# Patient Record
Sex: Female | Born: 1965
Health system: Southern US, Community
[De-identification: ages and names within clinical notes are randomized; demographics above are authoritative.]

## PROBLEM LIST (undated history)

## (undated) ENCOUNTER — Ambulatory Visit: Payer: BC Managed Care – PPO

## (undated) DIAGNOSIS — F329 Major depressive disorder, single episode, unspecified: Secondary | ICD-10-CM

## (undated) DIAGNOSIS — F419 Anxiety disorder, unspecified: Secondary | ICD-10-CM

## (undated) DIAGNOSIS — I2510 Atherosclerotic heart disease of native coronary artery without angina pectoris: Secondary | ICD-10-CM

## (undated) DIAGNOSIS — I5189 Other ill-defined heart diseases: Secondary | ICD-10-CM

## (undated) DIAGNOSIS — F32A Depression, unspecified: Secondary | ICD-10-CM

## (undated) DIAGNOSIS — K222 Esophageal obstruction: Secondary | ICD-10-CM

## (undated) DIAGNOSIS — K449 Diaphragmatic hernia without obstruction or gangrene: Secondary | ICD-10-CM

## (undated) DIAGNOSIS — E785 Hyperlipidemia, unspecified: Secondary | ICD-10-CM

## (undated) DIAGNOSIS — M4300 Spondylolysis, site unspecified: Secondary | ICD-10-CM

## (undated) DIAGNOSIS — M0579 Rheumatoid arthritis with rheumatoid factor of multiple sites without organ or systems involvement: Secondary | ICD-10-CM

## (undated) DIAGNOSIS — M1990 Unspecified osteoarthritis, unspecified site: Secondary | ICD-10-CM

## (undated) DIAGNOSIS — M069 Rheumatoid arthritis, unspecified: Secondary | ICD-10-CM

## (undated) DIAGNOSIS — M797 Fibromyalgia: Secondary | ICD-10-CM

## (undated) DIAGNOSIS — M719 Bursopathy, unspecified: Secondary | ICD-10-CM

## (undated) DIAGNOSIS — B009 Herpesviral infection, unspecified: Secondary | ICD-10-CM

## (undated) HISTORY — DX: Morbid (severe) obesity due to excess calories: E66.01

## (undated) HISTORY — DX: Atherosclerotic heart disease of native coronary artery without angina pectoris: I25.10

## (undated) HISTORY — DX: Anxiety disorder, unspecified: F41.9

## (undated) HISTORY — DX: Hyperlipidemia, unspecified: E78.5

## (undated) HISTORY — DX: Herpesviral infection, unspecified: B00.9

## (undated) HISTORY — PX: HERNIA REPAIR: SHX51

## (undated) HISTORY — DX: Unspecified osteoarthritis, unspecified site: M19.90

## (undated) HISTORY — DX: Diaphragmatic hernia without obstruction or gangrene: K44.9

## (undated) HISTORY — PX: TUBAL LIGATION: SHX77

## (undated) HISTORY — DX: Rheumatoid arthritis with rheumatoid factor of multiple sites without organ or systems involvement: M05.79

## (undated) HISTORY — DX: Bursopathy, unspecified: M71.9

## (undated) HISTORY — DX: Spondylolysis, site unspecified: M43.00

## (undated) HISTORY — DX: Esophageal obstruction: K22.2

## (undated) HISTORY — DX: Other ill-defined heart diseases: I51.89

---

## 1998-04-23 ENCOUNTER — Other Ambulatory Visit: Admission: RE | Admit: 1998-04-23 | Discharge: 1998-04-23 | Payer: Self-pay | Admitting: Obstetrics and Gynecology

## 1998-06-07 DIAGNOSIS — G43909 Migraine, unspecified, not intractable, without status migrainosus: Secondary | ICD-10-CM | POA: Insufficient documentation

## 1998-06-16 ENCOUNTER — Inpatient Hospital Stay (HOSPITAL_COMMUNITY): Admission: AD | Admit: 1998-06-16 | Discharge: 1998-06-16 | Payer: Self-pay | Admitting: Obstetrics & Gynecology

## 1998-08-03 ENCOUNTER — Inpatient Hospital Stay (HOSPITAL_COMMUNITY): Admission: AD | Admit: 1998-08-03 | Discharge: 1998-08-03 | Payer: Self-pay | Admitting: Obstetrics and Gynecology

## 1998-11-08 ENCOUNTER — Inpatient Hospital Stay (HOSPITAL_COMMUNITY): Admission: AD | Admit: 1998-11-08 | Discharge: 1998-11-08 | Payer: Self-pay | Admitting: *Deleted

## 1998-11-26 ENCOUNTER — Inpatient Hospital Stay (HOSPITAL_COMMUNITY): Admission: AD | Admit: 1998-11-26 | Discharge: 1998-11-30 | Payer: Self-pay | Admitting: Obstetrics and Gynecology

## 1998-11-26 ENCOUNTER — Ambulatory Visit (HOSPITAL_COMMUNITY): Admission: RE | Admit: 1998-11-26 | Discharge: 1998-11-26 | Payer: Self-pay | Admitting: Obstetrics and Gynecology

## 1998-12-11 ENCOUNTER — Encounter (HOSPITAL_COMMUNITY): Admission: RE | Admit: 1998-12-11 | Discharge: 1999-03-11 | Payer: Self-pay | Admitting: Obstetrics and Gynecology

## 1998-12-16 ENCOUNTER — Encounter: Admission: RE | Admit: 1998-12-16 | Discharge: 1999-03-16 | Payer: Self-pay | Admitting: Obstetrics and Gynecology

## 1999-04-27 ENCOUNTER — Other Ambulatory Visit: Admission: RE | Admit: 1999-04-27 | Discharge: 1999-04-27 | Payer: Self-pay | Admitting: Obstetrics and Gynecology

## 1999-05-04 ENCOUNTER — Emergency Department (HOSPITAL_COMMUNITY): Admission: EM | Admit: 1999-05-04 | Discharge: 1999-05-04 | Payer: Self-pay | Admitting: Emergency Medicine

## 1999-05-05 ENCOUNTER — Encounter: Payer: Self-pay | Admitting: Family Medicine

## 1999-05-05 ENCOUNTER — Encounter: Admission: RE | Admit: 1999-05-05 | Discharge: 1999-05-05 | Payer: Self-pay | Admitting: Family Medicine

## 2000-01-09 ENCOUNTER — Inpatient Hospital Stay (HOSPITAL_COMMUNITY): Admission: AD | Admit: 2000-01-09 | Discharge: 2000-01-09 | Payer: Self-pay | Admitting: Obstetrics and Gynecology

## 2000-03-23 ENCOUNTER — Inpatient Hospital Stay (HOSPITAL_COMMUNITY): Admission: AD | Admit: 2000-03-23 | Discharge: 2000-03-23 | Payer: Self-pay | Admitting: Obstetrics & Gynecology

## 2000-03-25 ENCOUNTER — Ambulatory Visit (HOSPITAL_COMMUNITY): Admission: RE | Admit: 2000-03-25 | Discharge: 2000-03-25 | Payer: Self-pay | Admitting: Obstetrics and Gynecology

## 2000-03-25 ENCOUNTER — Encounter: Payer: Self-pay | Admitting: Obstetrics and Gynecology

## 2000-04-17 ENCOUNTER — Inpatient Hospital Stay (HOSPITAL_COMMUNITY): Admission: AD | Admit: 2000-04-17 | Discharge: 2000-04-17 | Payer: Self-pay | Admitting: Obstetrics and Gynecology

## 2000-04-29 ENCOUNTER — Encounter (INDEPENDENT_AMBULATORY_CARE_PROVIDER_SITE_OTHER): Payer: Self-pay

## 2000-04-29 ENCOUNTER — Inpatient Hospital Stay (HOSPITAL_COMMUNITY): Admission: AD | Admit: 2000-04-29 | Discharge: 2000-05-02 | Payer: Self-pay | Admitting: Obstetrics & Gynecology

## 2000-05-30 ENCOUNTER — Other Ambulatory Visit: Admission: RE | Admit: 2000-05-30 | Discharge: 2000-05-30 | Payer: Self-pay | Admitting: Obstetrics & Gynecology

## 2000-12-24 ENCOUNTER — Encounter: Payer: Self-pay | Admitting: Emergency Medicine

## 2000-12-24 ENCOUNTER — Inpatient Hospital Stay (HOSPITAL_COMMUNITY): Admission: EM | Admit: 2000-12-24 | Discharge: 2000-12-26 | Payer: Self-pay | Admitting: Emergency Medicine

## 2000-12-26 ENCOUNTER — Encounter: Payer: Self-pay | Admitting: Internal Medicine

## 2001-02-09 ENCOUNTER — Inpatient Hospital Stay (HOSPITAL_COMMUNITY): Admission: EM | Admit: 2001-02-09 | Discharge: 2001-02-12 | Payer: Self-pay | Admitting: *Deleted

## 2001-11-10 ENCOUNTER — Encounter: Payer: Self-pay | Admitting: Emergency Medicine

## 2001-11-10 ENCOUNTER — Emergency Department (HOSPITAL_COMMUNITY): Admission: EM | Admit: 2001-11-10 | Discharge: 2001-11-10 | Payer: Self-pay | Admitting: Emergency Medicine

## 2001-12-25 ENCOUNTER — Encounter: Payer: Self-pay | Admitting: Emergency Medicine

## 2001-12-25 ENCOUNTER — Emergency Department (HOSPITAL_COMMUNITY): Admission: EM | Admit: 2001-12-25 | Discharge: 2001-12-25 | Payer: Self-pay | Admitting: Emergency Medicine

## 2003-01-28 ENCOUNTER — Encounter: Payer: Self-pay | Admitting: Emergency Medicine

## 2003-01-28 ENCOUNTER — Emergency Department (HOSPITAL_COMMUNITY): Admission: EM | Admit: 2003-01-28 | Discharge: 2003-01-28 | Payer: Self-pay | Admitting: Emergency Medicine

## 2003-03-26 ENCOUNTER — Encounter: Payer: Self-pay | Admitting: Emergency Medicine

## 2003-03-26 ENCOUNTER — Emergency Department (HOSPITAL_COMMUNITY): Admission: EM | Admit: 2003-03-26 | Discharge: 2003-03-27 | Payer: Self-pay | Admitting: Emergency Medicine

## 2003-07-18 ENCOUNTER — Emergency Department (HOSPITAL_COMMUNITY): Admission: EM | Admit: 2003-07-18 | Discharge: 2003-07-18 | Payer: Self-pay | Admitting: Emergency Medicine

## 2003-12-03 ENCOUNTER — Other Ambulatory Visit: Admission: RE | Admit: 2003-12-03 | Discharge: 2003-12-03 | Payer: Self-pay | Admitting: Obstetrics & Gynecology

## 2004-10-18 ENCOUNTER — Emergency Department (HOSPITAL_COMMUNITY): Admission: EM | Admit: 2004-10-18 | Discharge: 2004-10-18 | Payer: Self-pay | Admitting: Family Medicine

## 2004-11-13 ENCOUNTER — Emergency Department (HOSPITAL_COMMUNITY): Admission: EM | Admit: 2004-11-13 | Discharge: 2004-11-13 | Payer: Self-pay | Admitting: *Deleted

## 2004-11-14 ENCOUNTER — Emergency Department (HOSPITAL_COMMUNITY): Admission: EM | Admit: 2004-11-14 | Discharge: 2004-11-14 | Payer: Self-pay | Admitting: *Deleted

## 2005-01-22 ENCOUNTER — Emergency Department (HOSPITAL_COMMUNITY): Admission: EM | Admit: 2005-01-22 | Discharge: 2005-01-22 | Payer: Self-pay | Admitting: Family Medicine

## 2005-07-27 ENCOUNTER — Encounter: Admission: RE | Admit: 2005-07-27 | Discharge: 2005-07-27 | Payer: Self-pay | Admitting: Family Medicine

## 2005-11-11 ENCOUNTER — Emergency Department (HOSPITAL_COMMUNITY): Admission: EM | Admit: 2005-11-11 | Discharge: 2005-11-12 | Payer: Self-pay | Admitting: Family Medicine

## 2005-11-30 ENCOUNTER — Emergency Department (HOSPITAL_COMMUNITY): Admission: EM | Admit: 2005-11-30 | Discharge: 2005-11-30 | Payer: Self-pay | Admitting: Emergency Medicine

## 2005-12-20 ENCOUNTER — Ambulatory Visit (HOSPITAL_BASED_OUTPATIENT_CLINIC_OR_DEPARTMENT_OTHER): Admission: RE | Admit: 2005-12-20 | Discharge: 2005-12-20 | Payer: Self-pay | Admitting: Otolaryngology

## 2005-12-26 ENCOUNTER — Ambulatory Visit: Payer: Self-pay | Admitting: Internal Medicine

## 2006-04-13 ENCOUNTER — Encounter: Admission: RE | Admit: 2006-04-13 | Discharge: 2006-04-13 | Payer: Self-pay | Admitting: Internal Medicine

## 2006-07-07 ENCOUNTER — Emergency Department (HOSPITAL_COMMUNITY): Admission: EM | Admit: 2006-07-07 | Discharge: 2006-07-07 | Payer: Self-pay | Admitting: Emergency Medicine

## 2006-09-24 ENCOUNTER — Emergency Department (HOSPITAL_COMMUNITY): Admission: EM | Admit: 2006-09-24 | Discharge: 2006-09-24 | Payer: Self-pay | Admitting: Emergency Medicine

## 2006-10-02 ENCOUNTER — Ambulatory Visit (HOSPITAL_COMMUNITY): Admission: EM | Admit: 2006-10-02 | Discharge: 2006-10-02 | Payer: Self-pay | Admitting: Emergency Medicine

## 2006-11-07 ENCOUNTER — Encounter: Admission: RE | Admit: 2006-11-07 | Discharge: 2006-11-07 | Payer: Self-pay | Admitting: Family Medicine

## 2007-04-01 ENCOUNTER — Emergency Department (HOSPITAL_COMMUNITY): Admission: EM | Admit: 2007-04-01 | Discharge: 2007-04-01 | Payer: Self-pay | Admitting: Family Medicine

## 2007-05-16 ENCOUNTER — Emergency Department (HOSPITAL_COMMUNITY): Admission: EM | Admit: 2007-05-16 | Discharge: 2007-05-16 | Payer: Self-pay | Admitting: Emergency Medicine

## 2007-10-17 ENCOUNTER — Ambulatory Visit: Payer: Self-pay | Admitting: Cardiology

## 2007-10-17 ENCOUNTER — Inpatient Hospital Stay (HOSPITAL_COMMUNITY): Admission: EM | Admit: 2007-10-17 | Discharge: 2007-10-18 | Payer: Self-pay | Admitting: Emergency Medicine

## 2007-10-27 ENCOUNTER — Encounter: Payer: Self-pay | Admitting: Cardiology

## 2007-10-27 ENCOUNTER — Ambulatory Visit: Payer: Self-pay

## 2007-11-01 ENCOUNTER — Ambulatory Visit: Payer: Self-pay

## 2007-11-01 ENCOUNTER — Ambulatory Visit: Payer: Self-pay | Admitting: Cardiology

## 2007-11-25 ENCOUNTER — Emergency Department (HOSPITAL_COMMUNITY): Admission: EM | Admit: 2007-11-25 | Discharge: 2007-11-25 | Payer: Self-pay | Admitting: Emergency Medicine

## 2008-07-02 ENCOUNTER — Emergency Department (HOSPITAL_BASED_OUTPATIENT_CLINIC_OR_DEPARTMENT_OTHER): Admission: EM | Admit: 2008-07-02 | Discharge: 2008-07-02 | Payer: Self-pay | Admitting: Emergency Medicine

## 2008-07-02 ENCOUNTER — Ambulatory Visit: Payer: Self-pay | Admitting: Radiology

## 2008-07-18 ENCOUNTER — Ambulatory Visit: Payer: Self-pay | Admitting: Diagnostic Radiology

## 2008-07-18 ENCOUNTER — Emergency Department (HOSPITAL_BASED_OUTPATIENT_CLINIC_OR_DEPARTMENT_OTHER): Admission: EM | Admit: 2008-07-18 | Discharge: 2008-07-18 | Payer: Self-pay | Admitting: Internal Medicine

## 2008-07-22 ENCOUNTER — Encounter: Admission: RE | Admit: 2008-07-22 | Discharge: 2008-07-22 | Payer: Self-pay | Admitting: Family Medicine

## 2008-08-06 ENCOUNTER — Ambulatory Visit: Payer: Self-pay | Admitting: Diagnostic Radiology

## 2008-08-06 ENCOUNTER — Emergency Department (HOSPITAL_BASED_OUTPATIENT_CLINIC_OR_DEPARTMENT_OTHER): Admission: EM | Admit: 2008-08-06 | Discharge: 2008-08-06 | Payer: Self-pay | Admitting: Emergency Medicine

## 2008-10-11 ENCOUNTER — Emergency Department (HOSPITAL_BASED_OUTPATIENT_CLINIC_OR_DEPARTMENT_OTHER): Admission: EM | Admit: 2008-10-11 | Discharge: 2008-10-11 | Payer: Self-pay | Admitting: Emergency Medicine

## 2008-10-25 ENCOUNTER — Ambulatory Visit (HOSPITAL_COMMUNITY): Admission: RE | Admit: 2008-10-25 | Discharge: 2008-10-25 | Payer: Self-pay | Admitting: Obstetrics & Gynecology

## 2008-10-25 ENCOUNTER — Encounter (INDEPENDENT_AMBULATORY_CARE_PROVIDER_SITE_OTHER): Payer: Self-pay | Admitting: Obstetrics & Gynecology

## 2008-10-29 ENCOUNTER — Inpatient Hospital Stay (HOSPITAL_COMMUNITY): Admission: AD | Admit: 2008-10-29 | Discharge: 2008-10-29 | Payer: Self-pay | Admitting: Obstetrics & Gynecology

## 2009-04-09 ENCOUNTER — Ambulatory Visit (HOSPITAL_COMMUNITY): Admission: RE | Admit: 2009-04-09 | Discharge: 2009-04-09 | Payer: Self-pay | Admitting: Rheumatology

## 2009-09-03 ENCOUNTER — Ambulatory Visit: Payer: Self-pay | Admitting: Interventional Radiology

## 2009-09-03 ENCOUNTER — Emergency Department (HOSPITAL_BASED_OUTPATIENT_CLINIC_OR_DEPARTMENT_OTHER): Admission: EM | Admit: 2009-09-03 | Discharge: 2009-09-03 | Payer: Self-pay | Admitting: Emergency Medicine

## 2009-09-18 ENCOUNTER — Emergency Department (HOSPITAL_BASED_OUTPATIENT_CLINIC_OR_DEPARTMENT_OTHER): Admission: EM | Admit: 2009-09-18 | Discharge: 2009-09-18 | Payer: Self-pay | Admitting: Emergency Medicine

## 2009-11-28 ENCOUNTER — Ambulatory Visit: Payer: Self-pay | Admitting: Diagnostic Radiology

## 2009-11-28 ENCOUNTER — Emergency Department (HOSPITAL_BASED_OUTPATIENT_CLINIC_OR_DEPARTMENT_OTHER): Admission: EM | Admit: 2009-11-28 | Discharge: 2009-11-28 | Payer: Self-pay | Admitting: Emergency Medicine

## 2010-01-06 ENCOUNTER — Encounter: Admission: RE | Admit: 2010-01-06 | Discharge: 2010-01-06 | Payer: Self-pay | Admitting: Orthopedic Surgery

## 2010-01-19 ENCOUNTER — Encounter: Admission: RE | Admit: 2010-01-19 | Discharge: 2010-01-19 | Payer: Self-pay | Admitting: Obstetrics & Gynecology

## 2010-01-19 ENCOUNTER — Encounter: Admission: RE | Admit: 2010-01-19 | Discharge: 2010-01-19 | Payer: Self-pay | Admitting: Diagnostic Neuroimaging

## 2010-02-07 ENCOUNTER — Emergency Department (HOSPITAL_BASED_OUTPATIENT_CLINIC_OR_DEPARTMENT_OTHER): Admission: EM | Admit: 2010-02-07 | Discharge: 2010-02-07 | Payer: Self-pay | Admitting: Emergency Medicine

## 2010-02-07 ENCOUNTER — Emergency Department (HOSPITAL_COMMUNITY): Admission: EM | Admit: 2010-02-07 | Discharge: 2010-02-07 | Payer: Self-pay | Admitting: Emergency Medicine

## 2010-08-04 ENCOUNTER — Emergency Department (HOSPITAL_BASED_OUTPATIENT_CLINIC_OR_DEPARTMENT_OTHER)
Admission: EM | Admit: 2010-08-04 | Discharge: 2010-08-04 | Disposition: A | Discharge status: Home / Self Care | Payer: Managed Care, Other (non HMO) | Attending: Emergency Medicine | Admitting: Emergency Medicine

## 2010-08-04 DIAGNOSIS — R197 Diarrhea, unspecified: Secondary | ICD-10-CM | POA: Insufficient documentation

## 2010-08-04 DIAGNOSIS — G8929 Other chronic pain: Secondary | ICD-10-CM | POA: Insufficient documentation

## 2010-08-04 DIAGNOSIS — R1110 Vomiting, unspecified: Secondary | ICD-10-CM | POA: Insufficient documentation

## 2010-08-04 DIAGNOSIS — K5289 Other specified noninfective gastroenteritis and colitis: Secondary | ICD-10-CM | POA: Insufficient documentation

## 2010-08-04 LAB — DIFFERENTIAL
Basophils Absolute: 0 10*3/uL (ref 0.0–0.1)
Basophils Relative: 0 % (ref 0–1)
Eosinophils Absolute: 0 10*3/uL (ref 0.0–0.7)
Neutrophils Relative %: 88 % — ABNORMAL HIGH (ref 43–77)

## 2010-08-04 LAB — CBC
Platelets: 239 10*3/uL (ref 150–400)
RBC: 5.1 MIL/uL (ref 3.87–5.11)
WBC: 10.1 10*3/uL (ref 4.0–10.5)

## 2010-08-04 LAB — URINALYSIS, ROUTINE W REFLEX MICROSCOPIC
Ketones, ur: NEGATIVE mg/dL
Nitrite: NEGATIVE
Specific Gravity, Urine: 1.027 (ref 1.005–1.030)
pH: 6 (ref 5.0–8.0)

## 2010-08-04 LAB — BASIC METABOLIC PANEL
BUN: 13 mg/dL (ref 6–23)
GFR calc non Af Amer: >60 mL/min (ref >60)
Potassium: 4 mEq/L (ref 3.5–5.1)

## 2010-08-31 LAB — CBC
MCV: 84.5 fL (ref 78.0–100.0)
RBC: 4.75 MIL/uL (ref 3.87–5.11)
WBC: 9.9 10*3/uL (ref 4.0–10.5)

## 2010-08-31 LAB — POCT CARDIAC MARKERS
CKMB, poc: <1 ng/mL — ABNORMAL LOW (ref 1.0–8.0)
Myoglobin, poc: 58.8 ng/mL (ref 12–200)
Troponin i, poc: <0.05 ng/mL (ref 0.00–0.09)

## 2010-08-31 LAB — DIFFERENTIAL
Lymphocytes Relative: 38 % (ref 12–46)
Lymphs Abs: 3.8 10*3/uL (ref 0.7–4.0)
Monocytes Relative: 4 % (ref 3–12)
Neutro Abs: 5.3 10*3/uL (ref 1.7–7.7)
Neutrophils Relative %: 54 % (ref 43–77)

## 2010-08-31 LAB — BASIC METABOLIC PANEL
Calcium: 9 mg/dL (ref 8.4–10.5)
Chloride: 109 mEq/L (ref 96–112)
Creatinine, Ser: 0.8 mg/dL (ref 0.4–1.2)
GFR calc Af Amer: >60 mL/min (ref >60)
GFR calc non Af Amer: >60 mL/min (ref >60)

## 2010-08-31 LAB — D-DIMER, QUANTITATIVE: D-Dimer, Quant: 0.33 ug/mL-FEU (ref 0.00–0.48)

## 2010-09-15 LAB — CBC
HCT: 40.4 % (ref 36.0–46.0)
MCHC: 33.5 g/dL (ref 30.0–36.0)
MCV: 82.4 fL (ref 78.0–100.0)
Platelets: 228 10*3/uL (ref 150–400)
RBC: 4.9 MIL/uL (ref 3.87–5.11)
RDW: 13.1 % (ref 11.5–15.5)
WBC: 9.2 10*3/uL (ref 4.0–10.5)

## 2010-09-15 LAB — BASIC METABOLIC PANEL
BUN: 11 mg/dL (ref 6–23)
CO2: 26 mEq/L (ref 19–32)
Chloride: 107 mEq/L (ref 96–112)
Creatinine, Ser: 0.8 mg/dL (ref 0.4–1.2)
GFR calc Af Amer: >60 mL/min (ref >60)
Potassium: 3.5 mEq/L (ref 3.5–5.1)

## 2010-09-15 LAB — URINALYSIS, ROUTINE W REFLEX MICROSCOPIC
Ketones, ur: NEGATIVE mg/dL
Protein, ur: 30 mg/dL — AB
Urobilinogen, UA: 1 mg/dL (ref 0.0–1.0)

## 2010-09-15 LAB — DIFFERENTIAL
Basophils Relative: 1 % (ref 0–1)
Eosinophils Absolute: 0.2 10*3/uL (ref 0.0–0.7)
Eosinophils Relative: 3 % (ref 0–5)
Lymphs Abs: 3.6 10*3/uL (ref 0.7–4.0)
Monocytes Absolute: 0.5 10*3/uL (ref 0.1–1.0)
Monocytes Relative: 6 % (ref 3–12)
Neutrophils Relative %: 51 % (ref 43–77)

## 2010-09-15 LAB — POCT CARDIAC MARKERS: Myoglobin, poc: 60.7 ng/mL (ref 12–200)

## 2010-09-15 LAB — URINE MICROSCOPIC-ADD ON

## 2010-09-15 LAB — PREGNANCY, URINE: Preg Test, Ur: NEGATIVE

## 2010-10-20 NOTE — Op Note (Signed)
NAME:  [name] [name], [name] [name]          ACCOUNT NO.:  [id number]   MEDICAL RECORD NO.:  [id number]          PATIENT TYPE:  AMB   LOCATION:  SDC                           FACILITY:  [facility]   PHYSICIAN:  Ilda Mori, M.D.   DATE OF BIRTH:  09/12/1965   DATE OF PROCEDURE:  10/25/2008  DATE OF DISCHARGE:  10/25/2008                               OPERATIVE REPORT   PREOPERATIVE DIAGNOSIS:  Menorrhagia.   POSTOPERATIVE DIAGNOSIS:  Menorrhagia.   PROCEDURES:  1. Hysteroscopy.  2. NovaSure endometrial ablation.   SURGEON:  Ilda Mori, M.D.   ANESTHESIA:  General and the paracervical block was placed as well.   FINDINGS:  Small endometrial polyp, otherwise normal appearing uterine  cavity.   ESTIMATED BLOOD LOSS:  Minimal.   SPECIMENS:  Polyp and endometrial curettings.   INDICATIONS:  This is a 45 year old gravida 2, para 2 female status post  cesarean section x2, who has noted persistent heavy menstrual cycles  over the last 3 years.  Attempts to control her bleeding with birth  control pills had been unsuccessful.  The patient claims she bleeds  heavily and uses 40 pads per cycle and at times floods and soils her  clothing and her bed sheets.  Options were discussed with the patient  who elected to proceed with a NovaSure endometrial ablation.   DESCRIPTION OF PROCEDURE:  The patient was taken to the operating room  and general anesthesia was induced.  She was then placed in dorsal  lithotomy position, and vagina, perineum and vulva were prepped and  draped in a sterile fashion.  A time-out was then undertaken.  The  bladder was catheterized and emptied.  The cervix was grasped with  single-tooth tenaculum.  A Hagar dilator was placed and sounded the  cervix to approximately 3.7 mm.  A sound was then placed and sounded the  total uterus and cervix to 10 cm.  The internal os was dilated to 21-  [place].  A hysteroscope was introduced and a small endometrial polyp was  noted  otherwise the cavity appeared normal.  A polyp forceps was used  and the polyp was successfully removed.  The internal os was then  dilated further to a 25-French.  The NovaSure was introduced to the  fundus, withdrawn 1 cm and then deployed.  It was seated with a twisting  up and down motions, pushing and pulling until the width of the  instrument was 3.1 cm and appeared to  go no further.  The carbon dioxide test passed and ablation was carried  out for a 74-second.  After removal of the NovaSure, the cavity was  reexamined and a successful ablation appeared to have been carried out.  The procedure was then terminated and the patient left the operating  room in good condition.      Ilda Mori, M.D.  Electronically Signed     RK/MEDQ  D:  10/25/2008  T:  10/26/2008  Job:  [id number]

## 2010-10-20 NOTE — H&P (Signed)
NAMEMarland Kitchen  [name], [name]          ACCOUNT NO.:  [id number]   MEDICAL RECORD NO.:  [id number]          PATIENT TYPE:  INP   LOCATION:  1436                         FACILITY:  [facility]   PHYSICIAN:  Beckey Rutter, MD  DATE OF BIRTH:  1965-12-13   DATE OF ADMISSION:  10/16/2007  DATE OF DISCHARGE:                              HISTORY & PHYSICAL   PRIMARY CARE PHYSICIAN:  Dr. Andi Devon.   CHIEF COMPLAINT:  Chest pain.   HISTORY OF PRESENT ILLNESS:  This is a 45 year old pleasant African  American female with past medical history significant for obesity.  Presented today with chest pain.  Chest pain started while she was  trying to get inside her car.  The pain is mainly in the center of her  anterior chest with radiation in the form of numbness.  The pain is also  radiated to the left arm.  No significant shortness of breath is  associated with her condition.  No history of coronary artery disease,  hypertension, diabetes or hyperlipidemia mentioned.  The patient relates  sedentary life overall and had limited exercise capacity, but the  patient overall is able to walk 15 minutes every day.  The chest pain as  mentioned is pressure-like the pain has no aggravating factor and no  known relieving factor.  The patient started to feel headache after she  was given nitroglycerin, though the nitroglycerin did not affect the  intensity of the pain.  The patient has minimal pain currently, less  than 5.   PAST MEDICAL HISTORY:  Significant for obesity, otherwise, denied any  past history.   SOCIAL HISTORY:  No known drug abuse, nondrinker, nonsmoker.   SURGICAL HISTORY:  The patient had kidney stone status post surgical  excision.   FAMILY HISTORY:  Noncontributory.   REVIEW OF SYSTEMS:  A 12 point review of systems is non revealing/non-  pertinent   PHYSICAL EXAMINATION:  VITAL SIGNS:  Blood pressure is 105/62, pulse  rate 78.  Respiratory rate is 20, temperature 97.5.  HEENT:  Head:  Atraumatic, normocephalic.  Eyes: PERRL.  Mouth moist.  No ulcer.  NECK:  Supple.  No JVD.  LUNGS:  Bilateral decreased air entry.  PRECORDIUM:  Distal heart sounds.  ABDOMEN:  Very obese.  Bowel sounds are positive.  No tenderness.  EXTREMITIES:  No lower extremity edema.   LABORATORY DATA:  EKG was showing normal sinus rhythm with ventricular  rate 79.  No specific T wave abnormality and some prolongation in QTC  which is 460.  Sodium is 144, potassium 4.0, chloride is 108, bicarb 31,  glucose 86, BUN 9, creatinine 0.90, white blood count is 10.7,  hemoglobin 14.2, hematocrit 43.0, platelet count is 264.   ASSESSMENT AND PLAN:  This 45 year old with atypical chest pain will  admit for observation to rule out acute coronary syndrome.  Will cycle  cardiac enzymes.  Will check her EKG every 8 hours.  The patient will  also have TSH and lipid profile checked during the hospital course.  For  DVT prophylaxis will start Lovenox.  For GI prophylaxis will start  Protonix.  Beckey Rutter, MD  Electronically Signed     EME/MEDQ  D:  10/17/2007  T:  10/17/2007  Job:  [id number]   cc:   Merlene Laughter. Renae Gloss, M.D.  Fax: [phone]

## 2010-10-20 NOTE — Assessment & Plan Note (Signed)
[facility]                            CARDIOLOGY OFFICE NOTE   [name] [name], [name] [name]                 MRN:          [id number]  DATE:11/01/2007                            DOB:          [birth year]    PRIMARY CARE PHYSICIAN:  Merlene Laughter. Renae Gloss, M.D.   CARDIOLOGIST:  Jesse Sans. Wall, MD, FACC   This is a 45 year old African-American female patient who was admitted  to the hospital with atypical chest pain.  She has a history of normal  cath by Dr. Elsie Lincoln in 2002.  She has a history of obesity and anxiety.  She ruled out for an MI and was discharged home on metoprolol for  elevated blood pressure on arrival and a PPI.  She had an adenosine  Myoview in our office today that was read by Dr. Myrtis Ser.  This was read as  breast attenuation, and the images are noisy.  There is slight decreased  activity at the apex; however, I believe that there is no significant  abnormality.  Ejection fraction was 68%.   The patient states she still has pinpoint chest pain if she stands or  moves that can be sharp shooting.  It comes and goes and is not related  to exertion.  She did have elevated CKs but negative MBs and troponins  in the hospital.  Her D-dimer was normal at 0.34, but her TSH was  slightly elevated at 5.6.  HDL was low at 35 and LDL was high at 116.  Total cholesterol 169.   CURRENT MEDICATIONS:  1. Metoprolol 12.5 mg b.i.d.  2. Aspirin 325 daily.  3. Protonix 40 mg daily.   PHYSICAL EXAMINATION:  GENERAL:  This is a pleasant 45 year old African-  American female in no acute distress.  VITAL SIGNS:  Blood pressure 120/80, pulse 66.  NECK:  Without JVD, hepatojugular reflux, bruit or thyroid enlargement.  LUNGS:  Clear anterior, posterior and lateral.  HEART:  Regular rate and rhythm at 66 beats per minute.  Normal S1 and  S2.  No murmur, rub, bruit, thrill or heave noted.  ABDOMEN:  Soft without organomegaly, masses, lesions or abnormal  tenderness.  EXTREMITIES:  Without cyanosis or edema.  She has good distal pulses.   IMPRESSION:  1. Atypical chest pain, question etiology.  2. Negative adenosine Myoview on Nov 01, 2007.  Ejection fraction 68%.  3. History of normal cardiac cath in 2002 by Dr. Orvan Falconer.  4. Question of hypertension.  5. Anxiety depression.  6. Morbid obesity.  7. Borderline hypothyroidism.  8. History of kidney stones, treated with lithotripsy.   PLAN AT THIS TIME:  We do not feel her chest pain is cardiac in origin.  She is to follow up with Dr. Renae Gloss tomorrow, and I asked her to  discuss any further workup and treatment of her thyroid, as well as the  possibility of stopping her beta blocker if she begins exercising and a  weight loss program.  She can follow up with [place] p.r.n.      [name] Reedy, PA-C  Electronically Signed      Jonelle Sidle, MD  Electronically Signed   ML/MedQ  DD: 11/01/2007  DT: 11/01/2007  Job #: [id number]   cc:   Merlene Laughter. Renae Gloss, M.D.

## 2010-10-20 NOTE — Discharge Summary (Signed)
NAMEMarland [name]  [name], DOLS          ACCOUNT NO.:  [id number]   MEDICAL RECORD NO.:  [id number]          PATIENT TYPE:  INP   LOCATION:  1436                         FACILITY:  [facility]   PHYSICIAN:  Madaline Savage, MD        DATE OF BIRTH:  06-17-1965   DATE OF ADMISSION:  10/16/2007  DATE OF DISCHARGE:  10/18/2007                               DISCHARGE SUMMARY   PRIMARY CARE PHYSICIAN:  Cala Bradford R. Renae Gloss, M.D.   CONSULTS IN THE HOSPITAL:  She was seen by Dr. Daleen Squibb from [facility].   DISCHARGE DIAGNOSES:  1. Atypical chest pain.  2. Morbid obesity.  3. Depression.  4. Anxiety.   DISCHARGE MEDICATIONS:  1. Aspirin 325 mg daily.  2. Lopressor 12.5 mg twice daily.  3. Protonix 40 mg daily.   HISTORY OF PRESENT ILLNESS:  For full history and physical, see the  history and physical dictated by Dr. Tamsen Roers on Oct 17, 2007.  In short,  Ms. [name] [name] is a 45 year old lady with a history of morbid obesity who  comes in with atypical chest pain which was not relieved by  nitroglycerin.  She is admitted, and cardiology was consulted.   PROBLEM LIST:  1. Atypical chest pain.  Ms. Dobesh was admitted, and cardiology was      consulted.  Her initial EKG was normal sinus rhythm.  Her cardiac      enzymes were negative.  Her subsequent EKG showed some nonspecific      T-wave abnormalities.  At this time, cardiology has recommended an      outpatient stress test and also to start her on a proton pump      inhibitor.  Symptoms have improved, but she still continues to have      off and on chest pain.  They have arranged for an outpatient stress      test on Nov 01, 2007, and she will also get an outpatient      echocardiogram.  She also tells me that she has been depressed of      late, but she is not suicidal or homicidal at this time.  She has      good affect.  She has good eye contact.  At this time, I would      recommend her to follow up with her primary care doctor as soon as    possible.   DISPOSITION:  She is now being discharged home in stable condition.   FOLLOW UP:  She is asked to follow up with her primary care doctor, Dr.  Renae Gloss, in 1 week, and she also has an appointment for an outpatient  stress test on Nov 01, 2007 at 9:00 a.m., and an outpatient  echocardiogram on Oct 27, 2007 at 8:15 a.m.  She is being given the  phone number of [phone] to call to confirm her appointments.      Madaline Savage, MD  Electronically Signed     PKN/MEDQ  D:  10/18/2007  T:  10/18/2007  Job:  [id number]   cc:   Merlene Laughter. Renae Gloss, M.D.  Fax:  [phone]   Jesse Sans Wall, MD, FACC  [address]  [address]  [place]  [place] [address]

## 2010-10-20 NOTE — Consult Note (Signed)
NAME:  [name] [name], [name] [name]          ACCOUNT NO.:  [id number]   MEDICAL RECORD NO.:  [id number]          PATIENT TYPE:  INP   LOCATION:  1436                         FACILITY:  [facility]   PHYSICIAN:  Jesse Sans. Wall, MD, FACCDATE OF BIRTH:  1965/08/04   DATE OF CONSULTATION:  10/17/2007  DATE OF DISCHARGE:                                 CONSULTATION   PRIMARY CARDIOLOGIST:  [name] C. Wall, MD, [facility] (formerly Dr. Elsie Lincoln).   PRIMARY CARE PHYSICIAN:  Dr. Cipriano Bunker.   REQUESTING PHYSICIAN:  [facility].   REASON FOR CONSULTATION:  Chest pain.   HISTORY OF PRESENT ILLNESS:  This is a 45 year old African American  female, morbidly obese with no history of coronary artery disease, but  formally seen by Dr. Elsie Lincoln with a cath in 2002 with normal coronary  arteries per evaluation with review of records.  The patient has a  former diagnosis of anxiety, obesity, and kidney stones.  The patient  was climbing into her Zenaida Niece around 5:30 p.m. on day of admission and began  to have some substernal chest pain and tingling in her jaw, which began  around 8:30 p.m.  The substernal discomfort became worse, started  throughout the evening with associated tingling in her jaw.  She called  a nurse who was a neighbor who came by and took her blood pressure and  was found to be in the 160 systolic.  The nurse brought her to the  emergency room at [facility].  She was given baby aspirin,  nitroglycerin, and morphine and states that she really did not feel a  whole lot of relief.  The pain has not gone during the time of this  evaluation, but has actually lessened and gone down to about a 4/10.  The patient is a little rocky as she has just received some morphine.   REVIEW OF SYSTEMS:  Positive for sweating, chest pain, shortness of  breath, edema in her ankles, history of anxiety, and nausea.   PAST MEDICAL HISTORY:  Obesity, anxiety, kidney stone with lithotripsy,  and sciatica.  She had a cardiac  catheterization in 2002 per Dr. Elsie Lincoln,  report reviewed revealing normal coronaries with an EF of 58%.   PAST SURGICAL HISTORY:  Two C-sections, blood clot on the left foot,  postop ligament surgery, and urethral stent.   CURRENT MEDICATIONS AT HOME:  Xanax p.r.n. for anxiety.   MEDICATIONS HERE:  1. Aspirin 325 daily.  2. Lopressor 12.5 mg b.i.d.   She lives in [place] with her husband.  She works with [employer].  She is married with 2 children.  She does not smoke, does not drink  alcohol, and does not use drugs.  She has a lot of family issues  concerning to parents and a brother who are suicidal that she has had to  have committed.   FAMILY HISTORY:  Mother with depression and bipolar disease.  Father  with sarcoidosis.  Grandmother with CAD.  Sister with depression and a  brother with depression.   LABS:  Hemoglobin 14.2, hematocrit 43.0, white blood cell 10.7, and  platelets 264,000.  Sodium 143, potassium 4.1, chloride 111, CO2 25, BUN  9, creatinine 0.85, and glucose 83.  CK 453 and 395, MB 1.6 and 1.6, and  troponin 0.01 and 0.02 respectively.  Triglycerides 90, HDL 35, and LDL  116.   PHYSICAL EXAM:  VITAL SIGNS:  Blood pressure 120/72, pulse 78,  respirations 24, temperature 98.2, and O2 sat 96% on room air.  The  patient weighs 289 pounds.  HEENT:  Head is normocephalic and atraumatic.  Eyes, PERRLA.  Mucous  membranes of the mouth are pink and moist.  Tongue is midline.  NECK:  Supple.  There is no JVD.  No thyromegaly noted.  No carotid  bruits appreciated.  CARDIOVASCULAR:  Regular rate and rhythm without murmurs, rubs, or  gallops.  LUNGS:  Clear to auscultation.  ABDOMEN:  Soft and nontender with 2+ bowel sounds.  She is obese.  EXTREMITIES:  Without clubbing, cyanosis, or edema present.  NEURO:  Cranial nerves II through XII are grossly intact.   Chest x-ray revealing shallow lung volumes with mild bibasilar  atelectasis.  EKG:  Normal sinus rhythm  with ventricular rate of 80  beats per minute.   IMPRESSION:  1. Atypical chest pain with normal acute EKG, normal enzymes, and      normal catheterization, 6 years ago.  2. Obesity.  3. Hypertension currently controlled on Lopressor.  4. Rule out gastrointestinal origin of symptoms.  5. Situational anxiety.   PLAN:  This is a 45 year old morbidly obese African-American female with  complaints of substernal chest pain, which are atypical in nature, not  associated with exertion with normal cardiac enzymes and negative EKG  with a cardiac catheterization in 2002.   We will recommend outpatient adenosine stress test and echocardiogram  with history of hypertension, we would continue Lopressor as you were  doing for blood pressure control.  We would recommend weight loss and we  will  start a PPI secondary to the patient's symptoms, but also recommend  statin as her LDL is greater than 100 or dietary lifestyle changes.  We  will make more recommendations after stress test and follow up.  This  could be done as an outpatient.       Bettey Mare. Lyman Bishop, NP      Jesse Sans. Daleen Squibb, MD, [facility]  Electronically Signed    KML/MEDQ  D:  10/17/2007  T:  10/18/2007  Job:  [id number]   cc:   Merlene Laughter. Renae Gloss, M.D.  Fax: [phone]

## 2010-10-23 NOTE — Op Note (Signed)
[facility]  Patient:    [name] [name], [name] [name]                 MRN: [id number] Proc. Date: 04/29/00 Adm. Date:  [id number] Attending:  Lars Pinks                           Operative Report  PREOPERATIVE DIAGNOSIS:       Previous low transverse cesarean section, voluntary sterilization, refuses trial of labor after cesarean.  POSTOPERATIVE DIAGNOSES:      Previous low transverse cesarean section, voluntary sterilization, refuses trial of labor after cesarean.  PROCEDURE:                    Repeat low transverse cesarean section and bilateral tubal ligation.  SURGEON:                      Richard D. Arlyce Dice, M.D.  ASSISTANT:                    Luvenia Redden, M.D.  ANESTHESIA:                   Spinal.  ESTIMATED BLOOD LOSS:         500 cc.  FINDINGS:                     An 8 pound 0 ounce female infant.  Apgar scores 9 and 9.  Clear amniotic fluid.  Normal appearing tubes and ovaries.  The lower segment was remarkable because of a marked thinning to the point of translucency over the underlying membranes.  The defect was approximately 7 cm of the entire length of the lower segment and the thickness of the uterine wall at that point was approximately 1 mm.  INDICATIONS:                  This is a 45 year old gravida 2, para 1 with estimated date of confinement of November 30 who requests repeat cesarean section.  The patients previous cesarean section was done because of cephalopelvic disproportion after a failed induction with a delivery of a 9 pound 10 ounce baby.  Estimated fetal weight in this pregnancy in approximately 9 pounds.  The patient also elects permanent sterilization. Understands the failure rate of two per five per 1000 and that the procedure is permanent.  PROCEDURE:                    The patient was taken to the operating room and a spinal block was placed.  The patient was then placed in the dorsal lithotomy  position with right lateral displacement of the uterus.  The abdomen was prepped and draped in a sterile fashion.  The bladder was catheterized. Incision was made through the previous laparotomy scar, carried down to the fascia which was then opened transversely.  The rectus muscle was then sharply and bluntly dissected away from the overlying rectus sheath and the rectus muscle was then divided in the midline.  The peritoneum was entered sharply and extended vertically.  The lower segment was then identified and seemed to be bulging and was totally translucent.  The thickness of the lower segment was less than 1 mm.  This was incised with the knife and the plane between the lower segment and the amniotic membrane was then carefully dissected and the lower  segment was then sharply divided transversely.  The membranes were then entered.  The infant was delivered without difficulty with the aid of vacuum extractor.  The cord bloods were obtained.  The placenta was delivered with traction.  The lower segment was bluntly curettaged and then closed with an interlocking 1-0 Vicryl suture.  Hemostasis was obtained in one area with a horizontal matris suture.  The attention was then turned to the fallopian tubes.  The left tube was grasped at the isthmic ampullary junction and doubly ligated with plain catgut suture.  The knuckle of tube was excised for pathological confirmation.  An identical procedure was then carried out on the contralateral tube.  The ovaries were inspected and were both normal.  The patient has had a history of right lower quadrant pain but there seems to be no adnexal pathology seen.  The peritoneum was then closed with a running 0 Vicryl suture.  The rectus muscle was reopposed in the midline with an 0 Vicryl suture.  The fascia was closed with a running 0 Vicryl suture.  The skin was closed with staples.  The patient tolerated procedure well and left the operating room in  good condition.DD:  04/29/00 TD:  04/29/00 Job: [id number]

## 2010-10-23 NOTE — Discharge Summary (Signed)
[place]  Patient:    [name] [name], [name] [name]                 MRN: [id number] Adm. Date:  [id number] Disc. Date: [id number] Attending:  Alva Garnet.                           Discharge Summary  DISCHARGE DIAGNOSES: 1. Atypical chest pain. 2. Generalized anxiety. 3. Obesity.  CONSULTATIONS:  [facility].  HOSPITAL COURSE:  #1 - ATYPICAL CHEST PAIN:  [name] presented with a week long history of episodic chest pain radiating to her left arm.  She has no risk factors other than obesity, however, she presented with an abnormal EKG.  She was placed on Lovenox while her cardiac enzymes were pending.  Her cardiac enzymes including her troponin and CK-MB levels were unremarkable. [facility] was consulted who performed a Cardiolite stress test. Her Cardiolite did show an apical abnormality, but no ischemic disease.  A 2D echocardiogram will be performed as an outpatient.  It was decided that her chest pain was most likely not due to a cardiac etiology.  Other etiologies to consider would include GI such as peptic ulcer disease or musculoskeletal problem.  #2 - GENERALIZED ANXIETY:  [name] does report a high level of personal and job stressors at this time contributing to increased anxiety symptoms. This may of course may be contributing as well to her chest pain.  She will be placed on Xanax 0.5 mg one p.o. b.i.d.  She will be medically cleared to return to work one week from today, January 02, 2001.  FOLLOWUP:  [name] will be seen by Dr. Renae Gloss within two to three weeks following discharge.  A 2D echocardiogram was also pending following discharge.  DISCHARGE MEDICATIONS: 1. Aspirin 325 mg q.d. 2. Pepcid 20 mg p.o. b.i.d. 3. Xanax 0.5 mg one p.o. b.i.d. DD:  12/26/00 TD:  12/28/00 Job: [id number]

## 2010-10-23 NOTE — Discharge Summary (Signed)
[place]. [facility]  Patient:    [name] [name], [name] [name] Visit Number: [id number] MRN: [id number]          Service Type: MED Location: [phone] Attending Physician:  Madaline Savage Md Dictated by:   Darcella Gasman. Ingold, F.N.P.C. Admit Date:  02/09/2001 Discharge Date: 02/12/2001   CC:         Merlene Laughter. Renae Gloss, M.D.   Discharge Summary  DISCHARGE DIAGNOSES: 1. Chest pain, noncardiac. 2. Anxiety disorder.  DISCHARGE CONDITION:  Improved.  PROCEDURES:  February 11, 2001 - Combined left heart catheterization by Dr. Madaline Savage.  Normal catheterization.  DISCHARGE MEDICATIONS: 1. Protonix 40 mg 1 daily instead of Pepcid. 2. Aspirin 81 mg daily. 3. Xanax 0.5 mg as needed for anxiety. 4. Darvocet was also added as p.r.n. for right groin pain.  DISCHARGE INSTRUCTIONS: 1. No strenuous activity, no sexual activity, and no lifting over five pounds    for three days, then resume regular activity. 2. Low fat diet. 3. Wash catheterization site with soap and water.  Call if any bleeding,    swelling, or drainage. 4. If any problems at catheterization site, call Dr. Reino Kent office. 5. Follow up with Dr. Renae Gloss the next week for further evaluation of chest    pain.  HISTORY OF PRESENT ILLNESS:  This 45 year old African-American female was admitted on February 10, 2001, with chest pain.  She had previously been admitted with chest pain and had a nuclear study with questionable scar on the nuclear study.  She was discharged from [facility] and had continued chest pain.  Due to continued chest pain problems, it was felt prudent to do cardiac catheterization to further ensure there was no coronary artery disease.  Unfortunately, her H&P is currently not with this chart.  It was at the time of admission, but currently not at present.  The patient had been scheduled for outpatient cardiac catheterization, but she came to the emergency room  on February 10, 2001, during the night with chest pain.  She was placed on IV heparin and nitroglycerin and plans were made for cardiac catheterization.  She was admitted to a telemetry bed and could not be done on February 10, 2001, due to scheduling conflicts at [facility]. Her cardiac catheterization was then scheduled for Saturday morning, February 11, 2001.  PAST MEDICAL HISTORY:  History of reflux disease, wears glasses.  FAMILY/SOCIAL/REVIEW OF SYSTEMS:  See H&P.  PHYSICAL EXAMINATION AT DISCHARGE:  VITAL SIGNS:  Blood pressure 120/72, pulse 84, respirations 20, temperature 98.1, room air oxygen saturation 97%.  GENERAL:  Sluggish, slow to react, African-American female.  NEUROLOGICAL:  Exam is intact.  HEART:  Heart sounds regular rate and rhythm.  LUNGS:  Clear.  RIGHT GROIN:  With adipose tissue, trivial bruise, no tenderness, no hematoma. Right pedal pulse is normal and equal to the left.  LABORATORY VALUES:  Hemoglobin 12.6, hematocrit 37.3.  WBC 8.1, MCV 81.3, platelets 240.  Neutrophils 50, lymphocytes 41, monocytes 7, eosinophils 2, basophils 1.  Pro time 13.3, INR 1, and PTT of 30.  Sodium 142, potassium 3.6, chloride 107, CO2 27, glucose 91, BUN 7, creatinine 0.8, calcium 8.9.  Cardiac enzymes:  CK 274, MB 1.2, troponin-I 0.01 and the second CK 254, MB 1.2, and troponin less than 0.01.  Urine pregnancy was negative.  Chest x-ray is currently not in this chart.  EKG:  Sinus rhythm, with sinus arrhythmia, right ventricular hypertrophy, and incomplete right bundle  branch block.  Nonspecific EKG changes.  Cardiac catheterization, February 11, 2001:  Patent coronary arteries, normal LV function with an EF of 58%.  LAD and left circumflex arise from separate ostia.  HOSPITAL COURSE:  [name] [name] was admitted from the emergency room with chest pain.  Cardiac enzymes were negative.  She was scheduled for cardiac catheterization on the day of  admission, unfortunately, scheduling would not permit and she was scheduled for the morning of February 11, 2001.  She underwent the procedure and tolerated it well, with normal coronaries.  Post catheterization, she developed right groin pain.  The patient refused to get up, as restless, and continued to complain of groin pain when she was asked to ambulate.  She was to go home on February 11, 2001, but patient stated her groin was painful and she did not want to be discharged.  She was given Tylenol.  By February 12, 2001, she continued with complaints.  Dr. Elsie Lincoln felt that she was sluggish and slow to react and it would take her several seconds to turn over in bed, much longer than it should.  He examined her and she was neurologically intact and oriented.  He felt that her behaviors were inappropriate.  She was discharged home.  Her groin was stable.  There was minimal bruising and no hematoma or tenderness.  FOLLOWUP:  She will follow up with Dr. Andi Devon for further chest pain workup. Dictated by:   Darcella Gasman Ingold, F.N.P.C. Attending Physician:  Madaline Savage Md DD:  02/27/01 TD:  02/27/01 Job: [id number]

## 2010-10-23 NOTE — Procedures (Signed)
NAME:  [name] [name], [name] [name]          ACCOUNT NO.:  [id number]   MEDICAL RECORD NO.:  [id number]          PATIENT TYPE:  OUT   LOCATION:  SLEEP CENTER                 FACILITY:  [facility]   PHYSICIAN:  Clinton D. Maple Hudson, M.D. DATE OF BIRTH:  December 14, 1965   DATE OF STUDY:  12/20/2005                              NOCTURNAL POLYSOMNOGRAM   REFERRING PHYSICIAN:  Dr. Hermelinda Medicus.   DATE OF STUDY:  December 20, 2005.   INDICATIONS FOR STUDY:  Hypersomnia with sleep apnea.   EPWORTH SLEEPINESS SCORE:  13/24, BMI 48.  Weight 165 pounds.   MEDICATIONS:  Home medication include Topamax, cephalosporin antibiotic.   SLEEP ARCHITECTURE:  Total sleep time 352 minutes with sleep efficiency 88%.  Stage I was 7%, stage II 76%, stages III and IV were absent, REM 16% of  total sleep time.  Sleep latency 13 minutes, REM latency 77 minutes, awake  after sleep onset 34 minutes, arousal index 16.   RESPIRATORY DATA:  NPSG diagnostic protocol ordered.  Apnea/hypopnea index  (AHI/RDI) 0.9 obstructive events per hour which is within normal limits  (normal range 0 to 5 per hour).  This reflected 1 obstructive apnea and 4  hypopneas.  Most events occurred while sleeping supine.  REM AHI of 3.1 per  hour.   OXYGEN DATA:  Intermittent mild to moderate snoring with oxygen desaturation  to a nadir of 88%.  Mean oxygen saturation through the study was 95% on room  air.   CARDIAC DATA:  Normal sinus rhythm.   MOVEMENT/PARASOMNIA:  A total of 100 limb jerks were recorded of which 12  were associated with arousal or awakening for periodic limb movement with  arousal index of 2 per hour, which is probably not significant.   IMPRESSION/RECOMMENDATIONS:  1.  Occasional sleep disordered breathing events, apnea/hypopnea index of      0.9 per hour (normal range 0 to 5 per hour).  Most events were while      supine.  Snoring was mild with oxygen desaturation to a nadir of 88%.  2.  Periodic limb movement with arousal, 2 per  hour.  3.  No significant sleep disorder was identified from this study.  As      general guidelines, weight loss and efforts to sleep off of flat of back      may reduce any occasional sleep disordered breathing events at home.      Clinton D. Maple Hudson, M.D.  Diplomate, Biomedical engineer of Sleep Medicine  Electronically Signed     CDY/MEDQ  D:  12/26/2005 08:50:09  T:  12/26/2005 21:42:29  Job:  [id number]

## 2010-10-23 NOTE — Op Note (Signed)
NAME:  [name] [name], [name] [name]          ACCOUNT NO.:  [id number]   MEDICAL RECORD NO.:  [id number]          PATIENT TYPE:  EMS   LOCATION:  ED                           FACILITY:  [facility]   PHYSICIAN:  Valetta Fuller, M.D.  DATE OF BIRTH:  1965-07-27   DATE OF PROCEDURE:  10/02/2006  DATE OF DISCHARGE:                               OPERATIVE REPORT   PREOPERATIVE DIAGNOSIS:  5 mm proximal right ureteral calculus.   POSTOPERATIVE DIAGNOSIS:  5 mm proximal right ureteral calculus.   PROCEDURE PERFORMED:  Cystoscopy, right retrograde pyelography, right  rigid ureteroscopy, right flexible ureteroscopy, holmium laser  lithotripsy of stone, basketing of stone fragments, and right double-J  stent placement.   SURGEON:  Dr. Isabel Caprice.   ANESTHESIA:  Was general.   INDICATIONS:  [name] [name] is a 45 year old female who presented to the  local emergency room recently with right flank pain.  She was diagnosed  with a right proximal ureteral calculus.  The patient has continued to  have significant amounts of discomfort.  She had been back to the office  on at least one if not two occasions to get additional pain medication  in the form of Toradol.  She contacted Dr. Sherron Monday on Friday who told  me that this patient was having some difficulties and may need  intervention this weekend.  She called [place] earlier this morning reporting  that she could not take the pain anymore and wanted to have something  done if at all possible.  We directed her to the emergency room were  pain was managed.  A KUB showed continued presence of the suspicious  calcification in the area of the right proximal ureter.  The overall  location appeared to be relatively unchanged from what had been  appreciated on CT.  We went through several options with the patient.  This included continued observation along with the possibility of  lithotripsy sometime this week.  Other options include double-J stent  placement followed  by lithotripsy in attempt to try to treat the stone  definitively.  She requested definitive treatment if at all possible.  She appeared to understand the advantages and disadvantages of that  approach and full informed consent was obtained.   TECHNIQUE AND FINDINGS:  The patient was brought to the operating room  where she had successful induction of general anesthesia.  She was  placed in lithotomy position and prepped, draped usual manner.  Cystoscopy was performed initially and she had unremarkable bladder.   Retrograde pyelogram was done with an 8-French cone-tip catheter.  The  distal ureter was normal caliber and there was a filling defect really  in the proximal ureter just beneath the ureteropelvic junction.  During  the retrograde it appeared that the stone may have migrated more  proximally.  The renal pelvis and caliceal system was moderately  dilated.   After retrograde pyelogram we placed a Glidewire up to the renal pelvis  without difficulty.  We then engaged the distal ureter without  difficulty with a rigid ureteroscope up.  We were able to get the scope  all the way up to  the ureteropelvic junction where there did appear to  be some narrowing without evidence of inflammation and I wondered  endoscopically if she does not have a somewhat narrowed ureteropelvic  junction and underlying partial UPJ obstruction that may have been  keeping the stone from migrating.  One could just see the stone peering  out from the renal pelvis through the UPJ area.  Certainly given the  area of narrowing we did not feel that we could safely basket extract  that stone.  It again appeared to have migrated somewhat more  proximally.  We contemplated at that point just putting a stent in and  allowing her to be treated definitively at a later date.  My concern,  however, was given the size of the stone of only 5 mm and the fact that  she is fairly obese that visualization of the stone for  lithotripsy  could be quite difficult.  For that reason we went ahead and put access  sheath up her ureter and went ahead with flexible ureteroscopy.  The  stone was encountered in the renal pelvis and again appeared to measure  about 5-6 mm.  Given the area of narrowing we did not feel again that we  could basket extract this.  A 200 micron holmium laser fiber was then  utilized.  We broke the stone into approximately half a dozen pieces.  The largest two pieces of 2-3 mm were basket extracted.  The patient  appeared to tolerate everything well.  The wire was confirmed to be in  good position and over the wire we placed a 7-French 24 cm contour  stent.  We elected not to place this with a dangle string because of the  concern over possible inadvertant removal.  The patient was brought to  recovery room in stable condition and appeared to have no obvious  complications or problems.           ______________________________  Valetta Fuller, M.D.  Electronically Signed     DSG/MEDQ  D:  10/02/2006  T:  10/02/2006  Job:  [id number]   cc:   Martina Sinner, MD  Fax: [phone]

## 2010-10-23 NOTE — Cardiovascular Report (Signed)
[place]. [facility]  Patient:    [name] [name], [name] [name] Visit Number: [id number] MRN: [id number]          Service Type: MED Location: [phone] Attending Physician:  Ophelia Shoulder Dictated by:   Madaline Savage, M.D. Proc. Date: 02/11/01 Admit Date:  02/09/2001   CC:         Cala Bradford R. Renae Gloss, M.D.  Cardiac Catheterization Laboratory   Cardiac Catheterization  PROCEDURES PERFORMED: 1. Selective coronary angiography by Judkins technique. 2. Retrograde left heart catheterization. 3. Left ventricular angiography.  COMPLICATIONS:  None.  ENTRY SITE:  Right femoral.  DYE USED:  Omnipaque.  PATIENT PROFILE:  The patient is a 45 year old, African-American woman, who has had an admission for chest pain two months ago and has a history of esophageal reflux disorder.  She recently had a cardiac work-up which included an echocardiogram which failed to show an value of heart disease and a Cardiolite stress test which rates the episode of a fixed defect in the inferior wall.  She was scheduled for an outpatient cardiac catheterization on Friday, but instead was admitted to the hospital on the night before the catheterization with more chest pain.  Her cardiac enzymes have been negative for myocardial infarction.  She was originally scheduled to have her catheterization done last evening but scheduling problems prevented it, so today at 7:30 we performed inpatient cardiac catheterization.  RESULTS:  PRESSURES:  The left ventricular pressure was 120/15, central aortic pressure 120/75, mean of 95.  No aortic valve gradient by pullback technique.  ANGIOGRAPHIC RESULTS:  The left main coronary artery and the left circumflex coronary artery arise by separate ostia!  The LAD is normal.  The circumflex is normal.  The circumflex is a large vessel.  The right coronary artery is also angiographically normal and gives rise to a posterior  descending and posterolateral branch.  The left ventricle contracts normally.  There are no wall motion abnormalities.  Ejection fraction is 58%.  FINAL DIAGNOSES: 1. Angiographically patent coronary arteries. 2. Normal left ventricular systolic function.  PLAN:  Outpatient discharge in four hours.  Followup with primary care physician, Dr. Renae Gloss.  The patient is reassured from the standpoint of her heart.  Her pain may possibly be related to gastrointestinal pathology such as gastroesophageal reflux disorder. Dictated by:   Madaline Savage, M.D. Attending Physician:  Ophelia Shoulder DD:  02/11/01 TD:  02/11/01 Job: [id number]

## 2010-10-23 NOTE — Consult Note (Signed)
[place]. [facility]  Patient:    [name] [name], CHITTUM                 MRN: [id number] Proc. Date: 12/24/00 Adm. Date:  [id number] Attending:  Olene Craven CC:         Kern Reap, M.D.  [facility]   Consultation Report  REASON FOR CONSULTATION:  Chest pain.  IMPRESSION: 1. Chest pain, probably atypical for angina pectoris. 2. Elevated total CPK with negative MB and troponins.  This may indicate    noncardiac source.  The patient is also morbidly obese. 3. Nonspecific ST-T changes on EKG.  There is no evidence of ischemia or    infarct. 4. Morbid obesity.  RECOMMENDATIONS: 1. Due to nonspecific EKG changes and elevated CK and the patients concern,    we will admit the patient and follow up.  We will obtain CPK-MB serially. 2. We will start the patient on Lovenox per protocol until myocardial    infarction is ruled out. 3. We will check the serum lipids and also TSH.  Elevated CK could also be    secondary to severe hypothyroidism.  HISTORY OF PRESENT ILLNESS:  Ms. [name] [name] is a 45 year old female with no significant past medical history who has been complaining of chest pain for the last two months.  She states that the pain was in the front of the chest. Each episode lasts for about 4-5 hours and subsides on its own.  The pain has been persistent for the past two months on a continuous basis where she feels it occasionally when she presses on the chest.  Also, she has noticed some increase in chest pain with exertion.  This chest pain has been getting worse over the last two weeks.  In fact, she saw Dr. Barbee Shropshire in his office last Monday and she underwent routine exercise stress test, where she had to stop because of chest pain.  The stress test was nondiagnostic.  This afternoon, she was walking in the mall when she suddenly had severe chest pain associated with nausea and mild diaphoresis and mild shortness  of breath.  She took rest for about 15-20 minutes; however, the pain would not subside and, although she is unable to rate it, she stated it was severe enough for her to come to the emergency room.  In the emergency room, she was given sublingual nitroglycerin, which partially ______ her chest pain in about 15 minutes.  She states that she feels much better since she has been in the emergency room after she got morphine sulfate and nitroglycerin sublingually; however, she states that she still has pain.  This pain, she states, radiates to her left ear.  She denies any bowel or bladder dysfunction.  She denies any syncope.  She denies any palpitations.  She denies any symptoms suggestive of neurological weakness in the form of weakness in the extremities or transient loss of vision.  REVIEW OF SYSTEMS:  As dictated above.  Other systems were negative.  PAST MEDICAL HISTORY:  No history of diabetes or hypertension.  SOCIAL HISTORY:  She is married and has two children.  The last child is 17 months of age.  She does not smoke.  She does not use any illicit drugs.  FAMILY HISTORY:  There is no history of premature coronary artery disease in the family; however, her grandmother did have severe diabetes which she never took care of and she died with complications from  diabetes.  HOME MEDICATIONS:  None.  ALLERGIES:  No known drug allergies.  PHYSICAL EXAMINATION:  GENERAL:  She is moderately ______ and morbidly obese.  She appears to be in no acute distress.  VITAL SIGNS:  Temperature 97.0, pulse 85, respirations 18, blood pressure 107/68.  CARDIAC:  S1 and S2 normal.  No gallop.  No murmur.  There is no pericardial rub appreciated, even on expiration and leaning forward.  CHEST:  Bilaterally equal air.  No crackles.  ABDOMEN:  Benign.  Bowel sounds are in all four quadrants.  Obese abdomen. There was no obvious organomegaly.  EXTREMITIES:  No evidence of edema.  PERIPHERAL  VASCULAR:  All pulses equal.  LABORATORY DATA:  Her EKG demonstrated normal sinus rhythm, normal axis. There are nonspecific T-wave changes.  Her hemoglobin is 14.1, white count 9.2, platelet count 274,000.  CPK total is 413, MB is 1.2, and troponin is 0.01.  Electrolytes:  BUN and creatinine within normal limits.  Chest x-ray is unremarkable. DD:  12/24/00 TD:  12/26/00 Job: [id number]

## 2010-10-23 NOTE — Discharge Summary (Signed)
[facility]  Patient:    [name] [name], [name] [name]                 MRN: [id number] Adm. Date:  [id number] Disc. Date: [id number] Attending:  Lars Pinks Dictator:   Leilani Able, P.A.                           Discharge Summary  FINAL DIAGNOSES:              1. Intrauterine pregnancy at term.                               2. Previous low transverse cesarean section.                               3. Refuses trial of labor after cesarean                                  section.                               4. Wishes for voluntary sterilization.  PROCEDURES:                   1. Repeat low transverse cesarean section.                               2. Bilateral tubal ligation.  SURGEON:                      Richard D. Arlyce Dice, M.D.  ASSISTANT:                    Luvenia Redden, M.D.  COMPLICATIONS:                None.  HOSPITAL COURSE:              This 45 year old G2, P1 presents on April 29, 2000 for repeat cesarean section. Patients EDC is November 30. She had had a previous cesarean section performed of her last pregnancy secondary to cephalopelvic disproportion. Estimated fetal weight in this pregnancy is approximately nine pounds. The patient also elects for permanent sterilization. Patient is taken to the operating room on April 29, 2000 by Dr. Ilda Mori where a repeat low transverse cesarean section was performed with delivery of an 8-pound 0-ounce female infant with Apgars of 9 and 9. Delivery went without complications.  Postoperative course was benign without significant fevers. She was felt ready for discharge on postoperative day #3.  DISCHARGE INSTRUCTIONS:       She was sent home on a regular diet and told to decrease activity.  DISCHARGE MEDICATIONS:        She was told to continue prenatal vitamins. She was given Tylox #30, one to two every four hours as needed for pain.  FOLLOWUP:                     She  was told to follow up in the office in four weeks. DD:  05/30/00 TD:  05/30/00 Job: [id number]

## 2011-02-12 ENCOUNTER — Emergency Department (HOSPITAL_BASED_OUTPATIENT_CLINIC_OR_DEPARTMENT_OTHER)
Admission: EM | Admit: 2011-02-12 | Discharge: 2011-02-12 | Disposition: A | Discharge status: Home / Self Care | Payer: Managed Care, Other (non HMO) | Attending: Emergency Medicine | Admitting: Emergency Medicine

## 2011-02-12 ENCOUNTER — Encounter: Payer: Self-pay | Admitting: *Deleted

## 2011-02-12 DIAGNOSIS — M2550 Pain in unspecified joint: Secondary | ICD-10-CM

## 2011-02-12 DIAGNOSIS — M25569 Pain in unspecified knee: Secondary | ICD-10-CM | POA: Insufficient documentation

## 2011-02-12 DIAGNOSIS — M069 Rheumatoid arthritis, unspecified: Secondary | ICD-10-CM | POA: Insufficient documentation

## 2011-02-12 DIAGNOSIS — M25559 Pain in unspecified hip: Secondary | ICD-10-CM | POA: Insufficient documentation

## 2011-02-12 HISTORY — DX: Rheumatoid arthritis, unspecified: M06.9

## 2011-02-12 LAB — URINALYSIS, ROUTINE W REFLEX MICROSCOPIC
Glucose, UA: NEGATIVE mg/dL
Hgb urine dipstick: NEGATIVE
Leukocytes, UA: NEGATIVE
Specific Gravity, Urine: 1.021 (ref 1.005–1.030)
pH: 6.5 (ref 5.0–8.0)

## 2011-02-12 LAB — PREGNANCY, URINE: Preg Test, Ur: NEGATIVE

## 2011-02-12 MED ORDER — OXYCODONE-ACETAMINOPHEN 5-325 MG PO TABS
2.0000 | ORAL_TABLET | Freq: Once | ORAL | Status: AC
  Administered 2011-02-12: 2 via ORAL
  Filled 2011-02-12: qty 2

## 2011-02-12 MED ORDER — OXYCODONE-ACETAMINOPHEN 5-325 MG PO TABS: 2.0000 | ORAL_TABLET | ORAL | Status: AC | PRN

## 2011-02-12 NOTE — ED Notes (Signed)
Patient ambulated around the entire ED. Patient now resting on stretcher with husband at bedside.

## 2011-02-12 NOTE — ED Provider Notes (Signed)
Medical screening examination/treatment/procedure(s) were performed by non-physician practitioner and as supervising physician I was immediately available for consultation/collaboration.   Kipton Skillen, MD 02/12/11 2022 

## 2011-02-12 NOTE — ED Notes (Signed)
Pt to room 5 by ems via stretcher, reports having a flare of her ra starting today.  Pt reports general joint pain, bilateral hip pain and back pain x today.  Pt is a/a/ox4, states she felt fine yesterday.

## 2011-02-12 NOTE — ED Provider Notes (Signed)
History     CSN: [id number] Arrival date & time: 02/12/2011 11:13 AM  Chief Complaint  Patient presents with  . Joint Pain   HPI Comments: Pt states that she is having right hip and knee pain that started today:pt states that she is having an RA flare:pt states that she was at work when the pain started and she feels like she needs a shot of something for pain:pt states that she has flares when she is under a lot of stress:pt states that she has needed steroid injection in her hip and knee on that side in the past:pt states that she is having some back pain on that side as well:pt state denies any falls  Patient is a 45 y.o. female presenting with extremity pain. The history is provided by the patient. No language interpreter was used.  Extremity Pain This is a recurrent problem. The current episode started today. The problem occurs constantly. The problem has been unchanged. Pertinent negatives include no abdominal pain, fever, neck pain, numbness, vomiting or weakness. The symptoms are aggravated by nothing. She has tried nothing for the symptoms.    Past Medical History  Diagnosis Date  . Rheumatoid arthritis     History reviewed. No pertinent past surgical history.  History reviewed. No pertinent family history.  History  Substance Use Topics  . Smoking status: Not on file  . Smokeless tobacco: Not on file  . Alcohol Use:     OB History    Grav Para Term Preterm Abortions TAB SAB Ect Mult Living                  Review of Systems  Constitutional: Negative for fever.  HENT: Negative for neck pain.   Gastrointestinal: Negative for vomiting and abdominal pain.  Neurological: Negative for weakness and numbness.  All other systems reviewed and are negative.    Physical Exam  BP 132/84  Pulse 80  Temp(Src) 97.5 F (36.4 C) (Oral)  Resp 18  Ht 5\' 2"  (1.575 m)  Wt 350 lb (158.759 kg)  BMI 64.02 kg/m2  SpO2 100%  Physical Exam  Nursing note and vitals  reviewed. Constitutional: She is oriented to person, place, and time. She appears well-developed and well-nourished.  HENT:  Head: Normocephalic.  Eyes: Pupils are equal, round, and reactive to light.  Neck: Normal range of motion. Neck supple.  Cardiovascular: Normal rate and regular rhythm.   Pulmonary/Chest: Effort normal and breath sounds normal.  Musculoskeletal: Normal range of motion. She exhibits tenderness.       Pt tender to palpation on the right lower extremity without redness or swelling noted:pt has full rom  Neurological: She is alert and oriented to person, place, and time.    ED Course  Procedures Results for orders placed during the hospital encounter of 02/12/11  URINALYSIS, ROUTINE W REFLEX MICROSCOPIC      Component Value Range   Color, Urine YELLOW  YELLOW    Appearance CLEAR  CLEAR    Specific Gravity, Urine 1.021  1.005 - 1.030    pH 6.5  5.0 - 8.0    Glucose, UA NEGATIVE  NEGATIVE (mg/dL)   Hgb urine dipstick NEGATIVE  NEGATIVE    Bilirubin Urine NEGATIVE  NEGATIVE    Ketones, ur NEGATIVE  NEGATIVE (mg/dL)   Protein, ur NEGATIVE  NEGATIVE (mg/dL)   Urobilinogen, UA 1.0  0.0 - 1.0 (mg/dL)   Nitrite NEGATIVE  NEGATIVE    Leukocytes, UA NEGATIVE  NEGATIVE  PREGNANCY, URINE      Component Value Range   Preg Test, Ur NEGATIVE     No results found.   MDM Pt ambulating without any problems:will send pt home on something for pain      Teressa Lower, NP 02/12/11 1329

## 2011-03-02 ENCOUNTER — Other Ambulatory Visit: Payer: Self-pay | Admitting: Obstetrics & Gynecology

## 2011-03-02 DIAGNOSIS — Z1231 Encounter for screening mammogram for malignant neoplasm of breast: Secondary | ICD-10-CM

## 2011-03-08 ENCOUNTER — Ambulatory Visit
Admission: RE | Admit: 2011-03-08 | Discharge: 2011-03-08 | Disposition: A | Discharge status: Home / Self Care | Payer: Managed Care, Other (non HMO) | Source: Ambulatory Visit | Attending: Obstetrics & Gynecology | Admitting: Obstetrics & Gynecology

## 2011-03-08 DIAGNOSIS — Z1231 Encounter for screening mammogram for malignant neoplasm of breast: Secondary | ICD-10-CM

## 2011-03-09 ENCOUNTER — Other Ambulatory Visit: Payer: Self-pay | Admitting: Obstetrics & Gynecology

## 2011-03-09 DIAGNOSIS — N63 Unspecified lump in unspecified breast: Secondary | ICD-10-CM

## 2011-03-18 ENCOUNTER — Ambulatory Visit
Admission: RE | Admit: 2011-03-18 | Discharge: 2011-03-18 | Disposition: A | Discharge status: Home / Self Care | Payer: Managed Care, Other (non HMO) | Source: Ambulatory Visit | Attending: Obstetrics & Gynecology | Admitting: Obstetrics & Gynecology

## 2011-03-18 DIAGNOSIS — N63 Unspecified lump in unspecified breast: Secondary | ICD-10-CM

## 2011-08-14 ENCOUNTER — Other Ambulatory Visit: Payer: Self-pay

## 2011-08-14 ENCOUNTER — Emergency Department (INDEPENDENT_AMBULATORY_CARE_PROVIDER_SITE_OTHER): Payer: Managed Care, Other (non HMO)

## 2011-08-14 ENCOUNTER — Emergency Department (HOSPITAL_BASED_OUTPATIENT_CLINIC_OR_DEPARTMENT_OTHER)
Admission: EM | Admit: 2011-08-14 | Discharge: 2011-08-14 | Disposition: A | Discharge status: Home Health | Payer: Managed Care, Other (non HMO) | Attending: Emergency Medicine | Admitting: Emergency Medicine

## 2011-08-14 ENCOUNTER — Encounter (HOSPITAL_BASED_OUTPATIENT_CLINIC_OR_DEPARTMENT_OTHER): Payer: Self-pay | Admitting: Emergency Medicine

## 2011-08-14 DIAGNOSIS — M2550 Pain in unspecified joint: Secondary | ICD-10-CM | POA: Insufficient documentation

## 2011-08-14 DIAGNOSIS — Z79899 Other long term (current) drug therapy: Secondary | ICD-10-CM | POA: Insufficient documentation

## 2011-08-14 DIAGNOSIS — R071 Chest pain on breathing: Secondary | ICD-10-CM | POA: Insufficient documentation

## 2011-08-14 DIAGNOSIS — R059 Cough, unspecified: Secondary | ICD-10-CM | POA: Insufficient documentation

## 2011-08-14 DIAGNOSIS — R05 Cough: Secondary | ICD-10-CM | POA: Insufficient documentation

## 2011-08-14 DIAGNOSIS — IMO0001 Reserved for inherently not codable concepts without codable children: Secondary | ICD-10-CM | POA: Insufficient documentation

## 2011-08-14 DIAGNOSIS — R0602 Shortness of breath: Secondary | ICD-10-CM | POA: Insufficient documentation

## 2011-08-14 DIAGNOSIS — J40 Bronchitis, not specified as acute or chronic: Secondary | ICD-10-CM

## 2011-08-14 DIAGNOSIS — R0989 Other specified symptoms and signs involving the circulatory and respiratory systems: Secondary | ICD-10-CM

## 2011-08-14 DIAGNOSIS — R0789 Other chest pain: Secondary | ICD-10-CM | POA: Insufficient documentation

## 2011-08-14 DIAGNOSIS — R630 Anorexia: Secondary | ICD-10-CM | POA: Insufficient documentation

## 2011-08-14 DIAGNOSIS — J3489 Other specified disorders of nose and nasal sinuses: Secondary | ICD-10-CM | POA: Insufficient documentation

## 2011-08-14 DIAGNOSIS — R509 Fever, unspecified: Secondary | ICD-10-CM

## 2011-08-14 MED ORDER — IBUPROFEN 800 MG PO TABS
800.0000 mg | ORAL_TABLET | Freq: Once | ORAL | Status: AC
  Administered 2011-08-14: 800 mg via ORAL
  Filled 2011-08-14: qty 1

## 2011-08-14 MED ORDER — LIDOCAINE HCL (PF) 1 % IJ SOLN
INTRAMUSCULAR | Status: AC
  Administered 2011-08-14: 2.1 mL
  Filled 2011-08-14: qty 5

## 2011-08-14 MED ORDER — ALBUTEROL SULFATE HFA 108 (90 BASE) MCG/ACT IN AERS
2.0000 | INHALATION_SPRAY | Freq: Once | RESPIRATORY_TRACT | Status: AC
  Administered 2011-08-14: 2 via RESPIRATORY_TRACT
  Filled 2011-08-14: qty 6.7

## 2011-08-14 MED ORDER — CEFTRIAXONE SODIUM 1 G IJ SOLR
1.0000 g | INTRAMUSCULAR | Status: DC
  Administered 2011-08-14: 1 g via INTRAMUSCULAR
  Filled 2011-08-14: qty 10

## 2011-08-14 MED ORDER — AZITHROMYCIN 250 MG PO TABS: 250.0000 mg | ORAL_TABLET | Freq: Every day | ORAL | Status: AC

## 2011-08-14 MED ORDER — LIDOCAINE HCL (PF) 1 % IJ SOLN
2.1000 mL | Freq: Once | INTRAMUSCULAR | Status: AC
  Administered 2011-08-14: 2.1 mL

## 2011-08-14 MED ORDER — GUAIFENESIN 100 MG/5ML PO LIQD: 100.0000 mg | ORAL | Status: AC | PRN

## 2011-08-14 NOTE — ED Notes (Signed)
Runny nose, fever, congestion, productive cough with chest tightness since Monday.

## 2011-08-14 NOTE — ED Provider Notes (Signed)
History     CSN: [id number]  Arrival date & time 08/14/11  0802   First MD Initiated Contact with Patient 08/14/11 [id number]      Chief Complaint  Patient presents with  . Cough    (Consider location/radiation/quality/duration/timing/severity/associated sxs/prior treatment) HPI Comments: History rheumatoid arthritis on methotrexate and Embrel presenting with 5 days of cough, congestion, fever, runny nose and chest pain with coughing. Cough is productive of white specks of mucus. Associated with shortness of breath and chest tightness it radiates to the back. Also runny nose, sore throat, body aches and chills. Diminished appetite without nausea or vomiting. No abdominal pain. No leg pain or swelling.  The history is provided by the patient.    Past Medical History  Diagnosis Date  . Rheumatoid arthritis     History reviewed. No pertinent past surgical history.  History reviewed. No pertinent family history.  History  Substance Use Topics  . Smoking status: Never Smoker   . Smokeless tobacco: Not on file  . Alcohol Use: No    OB History    Grav Para Term Preterm Abortions TAB SAB Ect Mult Living                  Review of Systems  Constitutional: Positive for fever, activity change and appetite change.  HENT: Positive for sore throat and rhinorrhea.   Respiratory: Positive for cough, chest tightness and shortness of breath.   Cardiovascular: Positive for chest pain.  Gastrointestinal: Negative for nausea and abdominal pain.  Genitourinary: Negative for dysuria and hematuria.  Musculoskeletal: Positive for myalgias and arthralgias. Negative for back pain.  Skin: Negative for rash.  Neurological: Negative for headaches.    Allergies  Review of patient's allergies indicates no known allergies.  Home Medications   Current Outpatient Rx  Name Route Sig Dispense Refill  . ETANERCEPT 50 MG/ML [place] SOLN Subcutaneous Inject 50 mg into the skin once a week.      Marland Kitchen  HYDROCODONE-ACETAMINOPHEN 5-325 MG PO TABS Oral Take 1 tablet by mouth every 6 (six) hours as needed.    . SUMATRIPTAN SUCCINATE 25 MG PO TABS Oral Take 25 mg by mouth every 2 (two) hours as needed.    . AZITHROMYCIN 250 MG PO TABS Oral Take 1 tablet (250 mg total) by mouth daily. Take first 2 tablets together, then 1 every day until finished. 6 tablet 0  . GUAIFENESIN 100 MG/5ML PO LIQD Oral Take 5-10 mLs (100-200 mg total) by mouth every 4 (four) hours as needed for cough. 60 mL 0    BP 132/90  Pulse 89  Temp(Src) 97.5 F (36.4 C) (Oral)  Resp 26  SpO2 99%  Physical Exam  Constitutional: She is oriented to person, place, and time. She appears well-developed and well-nourished. No distress.  HENT:  Head: Normocephalic and atraumatic.  Mouth/Throat: Oropharynx is clear and moist. No oropharyngeal exudate.       No sinus tenderness  Eyes: Conjunctivae and EOM are normal. Pupils are equal, round, and reactive to light.  Neck: Normal range of motion. Neck supple.  Cardiovascular: Normal rate, regular rhythm and normal heart sounds.   Pulmonary/Chest: Effort normal. No respiratory distress. She exhibits tenderness.       Reproducible chest wall pain  Abdominal: Soft. There is no tenderness. There is no rebound and no guarding.  Musculoskeletal: Normal range of motion. She exhibits no edema and no tenderness.  Neurological: She is alert and oriented to person, place, and time. No  cranial nerve deficit.  Skin: Skin is warm.    ED Course  Procedures (including critical care time)   Labs Reviewed  D-DIMER, QUANTITATIVE   Dg Chest 2 View  08/14/2011  *RADIOLOGY REPORT*  Clinical Data: Fever, congestion and cough.  CHEST - 2 VIEW  Comparison: 09/03/2009.  Findings: The heart is upper limits of normal and stable given the low lung volumes.  The mediastinal and hilar contours are within normal limits and unchanged.  There are bronchitic type lung changes suggesting bronchitis without focal  infiltrate, effusion or edema.  The bony thorax is intact.  IMPRESSION: Bronchitic type lung changes suggesting acute bronchitis.  No focal infiltrates.  Original Report Authenticated By: P. Loralie Champagne, M.D.     1. Bronchitis       MDM  Immunosuppressed patient with cough, congestion, body aches, chills. Lungs clear, vitals stable. PERC negative. Ddimer neg.    Date: 08/14/2011  Rate: 90  Rhythm: normal sinus rhythm  QRS Axis: normal  Intervals: normal  ST/T Wave abnormalities: normal  Conduction Disutrbances:none  Narrative Interpretation:   Old EKG Reviewed: unchanged       Glynn Octave, MD 08/14/11 1535

## 2012-03-09 ENCOUNTER — Other Ambulatory Visit: Payer: Self-pay | Admitting: Obstetrics & Gynecology

## 2012-03-09 DIAGNOSIS — Z1231 Encounter for screening mammogram for malignant neoplasm of breast: Secondary | ICD-10-CM

## 2012-03-20 ENCOUNTER — Ambulatory Visit
Admission: RE | Admit: 2012-03-20 | Discharge: 2012-03-20 | Disposition: A | Discharge status: Home / Self Care | Payer: Managed Care, Other (non HMO) | Source: Ambulatory Visit | Attending: Obstetrics & Gynecology | Admitting: Obstetrics & Gynecology

## 2012-03-20 DIAGNOSIS — Z1231 Encounter for screening mammogram for malignant neoplasm of breast: Secondary | ICD-10-CM

## 2012-03-21 ENCOUNTER — Encounter (HOSPITAL_BASED_OUTPATIENT_CLINIC_OR_DEPARTMENT_OTHER): Payer: Self-pay | Admitting: *Deleted

## 2012-03-21 ENCOUNTER — Observation Stay (HOSPITAL_BASED_OUTPATIENT_CLINIC_OR_DEPARTMENT_OTHER)
Admission: EM | Admit: 2012-03-21 | Discharge: 2012-03-21 | Disposition: A | Discharge status: Home / Self Care | Payer: Managed Care, Other (non HMO) | Attending: Emergency Medicine | Admitting: Emergency Medicine

## 2012-03-21 DIAGNOSIS — R0602 Shortness of breath: Secondary | ICD-10-CM | POA: Insufficient documentation

## 2012-03-21 DIAGNOSIS — T50B95A Adverse effect of other viral vaccines, initial encounter: Secondary | ICD-10-CM | POA: Insufficient documentation

## 2012-03-21 DIAGNOSIS — T50A95A Adverse effect of other bacterial vaccines, initial encounter: Secondary | ICD-10-CM | POA: Insufficient documentation

## 2012-03-21 DIAGNOSIS — T7840XA Allergy, unspecified, initial encounter: Secondary | ICD-10-CM

## 2012-03-21 DIAGNOSIS — T782XXA Anaphylactic shock, unspecified, initial encounter: Principal | ICD-10-CM | POA: Insufficient documentation

## 2012-03-21 HISTORY — DX: Depression, unspecified: F32.A

## 2012-03-21 HISTORY — DX: Major depressive disorder, single episode, unspecified: F32.9

## 2012-03-21 MED ORDER — ONDANSETRON HCL 4 MG/2ML IJ SOLN: 4.0000 mg | Freq: Four times a day (QID) | INTRAMUSCULAR | Status: DC | PRN

## 2012-03-21 MED ORDER — PREDNISONE 50 MG PO TABS: ORAL_TABLET | ORAL | Status: DC

## 2012-03-21 MED ORDER — SODIUM CHLORIDE 0.9 % IV SOLN: 20.0000 mL | INTRAVENOUS | Status: DC

## 2012-03-21 MED ORDER — METHYLPREDNISOLONE SODIUM SUCC 125 MG IJ SOLR
INTRAMUSCULAR | Status: AC
  Filled 2012-03-21: qty 2

## 2012-03-21 MED ORDER — DIPHENHYDRAMINE HCL 25 MG PO CAPS: 25.0000 mg | ORAL_CAPSULE | Freq: Four times a day (QID) | ORAL | Status: DC | PRN

## 2012-03-21 MED ORDER — EPINEPHRINE 0.3 MG/0.3ML IJ DEVI: INTRAMUSCULAR | Status: DC

## 2012-03-21 MED ORDER — EPINEPHRINE HCL 1 MG/ML IJ SOLN
INTRAMUSCULAR | Status: AC
  Filled 2012-03-21: qty 1

## 2012-03-21 MED ORDER — ACETAMINOPHEN 325 MG PO TABS
650.0000 mg | ORAL_TABLET | ORAL | Status: DC | PRN
  Administered 2012-03-21: 650 mg via ORAL
  Filled 2012-03-21: qty 2

## 2012-03-21 MED ORDER — FAMOTIDINE 20 MG PO TABS
20.0000 mg | ORAL_TABLET | Freq: Two times a day (BID) | ORAL | Status: DC
  Administered 2012-03-21: 20 mg via ORAL
  Filled 2012-03-21: qty 1

## 2012-03-21 MED ORDER — ONDANSETRON HCL 4 MG/2ML IJ SOLN
4.0000 mg | Freq: Once | INTRAMUSCULAR | Status: AC
  Administered 2012-03-21: 4 mg via INTRAVENOUS
  Filled 2012-03-21: qty 2

## 2012-03-21 NOTE — ED Provider Notes (Signed)
History     CSN: [id number]  Arrival date & time 03/21/12  1109   First MD Initiated Contact with Patient 03/21/12 1142      Chief Complaint  Patient presents with  . Allergic Reaction     Patient is a 46 y.o. female presenting with allergic reaction. The history is provided by the patient.  Allergic Reaction The primary symptoms are  shortness of breath and angioedema. The primary symptoms do not include wheezing, diarrhea, altered mental status, rash or urticaria. The current episode started 1 to 2 hours ago. The problem has been gradually improving. This is a new problem.  The angioedema is associated with shortness of breath.   The onset of the reaction was associated with a new medication. Significant symptoms that are not present include itching.  PT reports she was at work and received flu shot and soon after developed throat tightness and tongue swelling No other new exposures or medications She has never had this type of reaction She is now feeling improved - she received medications prior to arrival including epinephrine. No syncope reported No wheeze reported No rash/itching reported  Past Medical History  Diagnosis Date  . Rheumatoid arthritis   . Depression     History reviewed. No pertinent past surgical history.  Family History - positive for CAD.  No h/o severe allergic reactions  History  Substance Use Topics  . Smoking status: Never Smoker   . Smokeless tobacco: Not on file  . Alcohol Use: No    OB History    Grav Para Term Preterm Abortions TAB SAB Ect Mult Living                  Review of Systems  Respiratory: Positive for shortness of breath. Negative for wheezing.   Gastrointestinal: Negative for diarrhea.  Skin: Negative for itching and rash.  Psychiatric/Behavioral: Negative for altered mental status.  All other systems reviewed and are negative.    Allergies  Flu virus vaccine  Home Medications   Current Outpatient Rx  Name  Route Sig Dispense Refill  . CELEXA PO Oral Take by mouth.    . ETANERCEPT 50 MG/ML [place] SOLN Subcutaneous Inject 50 mg into the skin once a week.      Marland Kitchen HYDROCODONE-ACETAMINOPHEN 5-325 MG PO TABS Oral Take 1 tablet by mouth every 6 (six) hours as needed.    . SUMATRIPTAN SUCCINATE 25 MG PO TABS Oral Take 25 mg by mouth every 2 (two) hours as needed.      BP 176/59  Pulse 97  Temp 97.9 F (36.6 C) (Oral)  Resp 20  SpO2 100% BP 149/78  Pulse 97  Temp 97.9 F (36.6 C) (Oral)  Resp 20  SpO2 96%   Physical Exam CONSTITUTIONAL: Well developed/well nourished HEAD AND FACE: Normocephalic/atraumatic EYES: EOMI/PERRL ENMT: Mucous membranes moist, uvula midline, no angioedema, no tongue swelling, no stridor is noted NECK: supple no meningeal signs SPINE:entire spine nontender CV: S1/S2 noted, no murmurs/rubs/gallops noted LUNGS: Lungs are clear to auscultation bilaterally, no apparent distress ABDOMEN: soft, nontender, no rebound or guarding GU:no cva tenderness NEURO: Pt is awake/alert, moves all extremitiesx4 EXTREMITIES: pulses normal, full ROM, no discoloration to hands/fingers noted SKIN: warm, color normal, no rash noted PSYCH: no abnormalities of mood noted  ED Course  Procedures  12:00 PM Pt currently stable.  She did receive epipnephrine prior to arrival.  Will monitor in ED for 4 hours on protocol/OBS to ensure no return of allergic symptoms.  Pt agreeable  2:07 PM Pt resting comfortably, no further complaints.  No wheeze, no angioedema noted  3:13 PM Pt improved, no resp symptoms and no angioedema.  She requests d/c home.  Pt has been in ED close to 4 hours without rebound.  She did vomit about an hour ago but this was after her family brought chicken for her to eat in the ED.  She reports feeling improved Advised against having flu shot due to reaction today MDM  Nursing notes including past medical history and social history reviewed and considered in  documentation         Joya Gaskins, MD 03/21/12 1514

## 2012-03-21 NOTE — ED Notes (Signed)
States she had a flu shot this am and shortly afterward she felt like her throat was closing. She was given Epi 0.3 SQ prior to arrival of EMS. EMS gave her Epi 0.3 IM, Solumedrol 125 IV and Zantac 50 mg. On arrival she is anxious. Speaking in complete sentences. No sob or rash. C/o fingers feeling tingly.

## 2012-03-21 NOTE — ED Notes (Signed)
Pt denies SHOB, throat swelling or rash/hives.  Presently sitting in bed eating lunch.

## 2012-03-21 NOTE — ED Notes (Signed)
MD at bedside. 

## 2012-05-07 DIAGNOSIS — Z0000 Encounter for general adult medical examination without abnormal findings: Secondary | ICD-10-CM | POA: Insufficient documentation

## 2012-05-08 ENCOUNTER — Encounter (HOSPITAL_BASED_OUTPATIENT_CLINIC_OR_DEPARTMENT_OTHER): Payer: Self-pay | Admitting: *Deleted

## 2012-05-08 ENCOUNTER — Emergency Department (HOSPITAL_BASED_OUTPATIENT_CLINIC_OR_DEPARTMENT_OTHER)
Admission: EM | Admit: 2012-05-08 | Discharge: 2012-05-08 | Disposition: A | Discharge status: Home / Self Care | Payer: Managed Care, Other (non HMO) | Attending: Emergency Medicine | Admitting: Emergency Medicine

## 2012-05-08 ENCOUNTER — Emergency Department (HOSPITAL_BASED_OUTPATIENT_CLINIC_OR_DEPARTMENT_OTHER): Payer: Managed Care, Other (non HMO)

## 2012-05-08 DIAGNOSIS — Z79899 Other long term (current) drug therapy: Secondary | ICD-10-CM | POA: Insufficient documentation

## 2012-05-08 DIAGNOSIS — F329 Major depressive disorder, single episode, unspecified: Secondary | ICD-10-CM | POA: Insufficient documentation

## 2012-05-08 DIAGNOSIS — R51 Headache: Secondary | ICD-10-CM | POA: Insufficient documentation

## 2012-05-08 DIAGNOSIS — R42 Dizziness and giddiness: Secondary | ICD-10-CM | POA: Insufficient documentation

## 2012-05-08 DIAGNOSIS — M069 Rheumatoid arthritis, unspecified: Secondary | ICD-10-CM | POA: Insufficient documentation

## 2012-05-08 DIAGNOSIS — F3289 Other specified depressive episodes: Secondary | ICD-10-CM | POA: Insufficient documentation

## 2012-05-08 LAB — CBC WITH DIFFERENTIAL/PLATELET
Basophils Relative: 0 % (ref 0–1)
Eosinophils Absolute: 0.2 10*3/uL (ref 0.0–0.7)
HCT: 41.9 % (ref 36.0–46.0)
Hemoglobin: 13.8 g/dL (ref 12.0–15.0)
Lymphs Abs: 5.8 10*3/uL — ABNORMAL HIGH (ref 0.7–4.0)
MCH: 27.8 pg (ref 26.0–34.0)
MCHC: 32.9 g/dL (ref 30.0–36.0)
Monocytes Absolute: 0.9 10*3/uL (ref 0.1–1.0)
Monocytes Relative: 8 % (ref 3–12)
Neutro Abs: 5.5 10*3/uL (ref 1.7–7.7)
RBC: 4.96 MIL/uL (ref 3.87–5.11)

## 2012-05-08 LAB — BASIC METABOLIC PANEL
GFR calc Af Amer: >90 mL/min (ref >90)
GFR calc non Af Amer: 88 mL/min — ABNORMAL LOW (ref >90)
Potassium: 3.7 mEq/L (ref 3.5–5.1)
Sodium: 141 mEq/L (ref 135–145)

## 2012-05-08 MED ORDER — SODIUM CHLORIDE 0.9 % IV BOLUS (SEPSIS)
1000.0000 mL | Freq: Once | INTRAVENOUS | Status: AC
  Administered 2012-05-08: 1000 mL via INTRAVENOUS

## 2012-05-08 MED ORDER — MORPHINE SULFATE 4 MG/ML IJ SOLN
4.0000 mg | Freq: Once | INTRAMUSCULAR | Status: AC
  Administered 2012-05-08: 4 mg via INTRAVENOUS
  Filled 2012-05-08: qty 1

## 2012-05-08 MED ORDER — METOCLOPRAMIDE HCL 5 MG/ML IJ SOLN
10.0000 mg | Freq: Once | INTRAMUSCULAR | Status: AC
Start: 2012-05-08 — End: 2012-05-08
  Administered 2012-05-08: 10 mg via INTRAVENOUS
  Filled 2012-05-08: qty 2

## 2012-05-08 NOTE — ED Notes (Addendum)
Pt c/o sudden onset of dizziness and /a x 1 hr ago

## 2012-05-08 NOTE — ED Provider Notes (Signed)
History   This chart was scribed for [name] [name] B. Bernette Mayers, MD by Donne Anon, ED Scribe. This patient was seen in room MH08/MH08 and the patient's care was started at 16:37.   CSN: [id number]  Arrival date & time 05/08/12  1637   First MD Initiated Contact with Patient 05/08/12 1723      Chief Complaint  Patient presents with  . Dizziness  . Headache    The history is provided by the patient. No language interpreter was used.   [name] [name] is a 46 y.o. female who presents to the Emergency Department complaining of a gradual onset headache, described as pounding, which became suddenly worse, and originating in the occipital region and radiating towards the right side. It began today while she was sitting at her desk.  Associated with a 'knot' on her R posterior neck. She reports she becomes dizzy with standing and her headache worsens with bright lights. She denies nausea, stress at work or emesis.She has a family history of aneurysms. She reports never having a headache similar to this.  She denies the possibility of pregnancy. She is allergic to the flu shot.   Past Medical History  Diagnosis Date  . Rheumatoid arthritis   . Depression     Past Surgical History  Procedure Date  . Cesarean section   . Tubal ligation     History reviewed. No pertinent family history.  History  Substance Use Topics  . Smoking status: Never Smoker   . Smokeless tobacco: Not on file  . Alcohol Use: No    OB History    Grav Para Term Preterm Abortions TAB SAB Ect Mult Living                  Review of Systems A complete 10 system review of systems was obtained and all systems are negative except as noted in the HPI and PMH.   Allergies  Flu virus vaccine  Home Medications   Current Outpatient Rx  Name  Route  Sig  Dispense  Refill  . CELEXA PO   Oral   Take by mouth.         . EPINEPHRINE 0.3 MG/0.3ML IJ DEVI      Inject into right thigh for severe allergic reaction  and then call 911   2 Device   1   . ETANERCEPT 50 MG/ML [place] SOLN   Subcutaneous   Inject 50 mg into the skin once a week.           Marland Kitchen HYDROCODONE-ACETAMINOPHEN 5-325 MG PO TABS   Oral   Take 1 tablet by mouth every 6 (six) hours as needed.         Marland Kitchen PREDNISONE 50 MG PO TABS      One tablet PO daily   4 tablet   0   . SUMATRIPTAN SUCCINATE 25 MG PO TABS   Oral   Take 25 mg by mouth every 2 (two) hours as needed.           Triage Vitals: BP 154/89  Pulse 88  Temp 98.4 F (36.9 C) (Oral)  Resp 16  Ht 5\' 2"  (1.575 m)  Wt 312 lb (141.522 kg)  BMI 57.07 kg/m2  SpO2 98%  Physical Exam  Nursing note and vitals reviewed. Constitutional: She is oriented to person, place, and time. She appears well-developed and well-nourished.  HENT:  Head: Normocephalic and atraumatic.       There is a small sore, ?  Folliculitis to the top of her head, no fluctuance or induration  Eyes: EOM are normal. Pupils are equal, round, and reactive to light.  Neck: Normal range of motion. Neck supple.       ? R paraspinal neck muscle spasm on palpation, no discrete mass/lymphadenopathy  Cardiovascular: Normal rate, normal heart sounds and intact distal pulses.   Pulmonary/Chest: Effort normal and breath sounds normal.  Abdominal: Bowel sounds are normal. She exhibits no distension. There is no tenderness.  Musculoskeletal: Normal range of motion. She exhibits no edema and no tenderness.  Neurological: She is alert and oriented to person, place, and time. She has normal strength. No cranial nerve deficit or sensory deficit.  Skin: Skin is warm and dry. No rash noted.  Psychiatric: She has a normal mood and affect.    ED Course  Procedures (including critical care time) DIAGNOSTIC STUDIES: Oxygen Saturation is 98% on room air, normal by my interpretation.    COORDINATION OF CARE: 5:32PM Discussed treatment plan which includes pain medication and a CT scan with pt at bedside and pt agreed  to plan.    Labs Reviewed  CBC WITH DIFFERENTIAL - Abnormal; Notable for the following:    WBC 12.5 (*)     Lymphs Abs 5.8 (*)     All other components within normal limits  BASIC METABOLIC PANEL - Abnormal; Notable for the following:    GFR calc non Af Amer 88 (*)     All other components within normal limits   Ct Head Wo Contrast  05/08/2012  *RADIOLOGY REPORT*  Clinical Data: Severe headache.  CT HEAD WITHOUT CONTRAST  Technique:  Contiguous axial images were obtained from the base of the skull through the vertex without contrast.  Comparison: 07/18/2008  Findings: There is no evidence of intracranial hemorrhage, brain edema or other signs of acute infarction.  There is no evidence of intracranial mass lesion or mass effect.  No abnormal extra-axial fluid collections are identified.  Ventricles are normal in size.  No skull abnormality identified.  IMPRESSION: Negative noncontrast head CT.   Original Report Authenticated By: Myles Rosenthal, M.D.      No diagnosis found.    MDM  Labs and CT unremarkable. Pt reports headache is resolved but still feels a knot on the back of her neck on the right. 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 After discussing the risk, benefits and alternatives the patient declines lumbar puncture. She was advised to followup with PCP for recheck and return to the ED as needed.     I personally performed the services described in this documentation, which was scribed in my presence. The recorded information has been reviewed and is accurate.        Aviv Rota B. Bernette Mayers, MD 05/08/12 [phone]

## 2012-07-13 DIAGNOSIS — M1990 Unspecified osteoarthritis, unspecified site: Secondary | ICD-10-CM | POA: Insufficient documentation

## 2012-07-13 DIAGNOSIS — F329 Major depressive disorder, single episode, unspecified: Secondary | ICD-10-CM | POA: Insufficient documentation

## 2012-09-04 ENCOUNTER — Other Ambulatory Visit: Payer: Self-pay | Admitting: Family Medicine

## 2012-09-04 ENCOUNTER — Telehealth: Payer: Self-pay | Admitting: *Deleted

## 2012-09-04 ENCOUNTER — Other Ambulatory Visit: Payer: Self-pay | Admitting: *Deleted

## 2012-09-04 MED ORDER — DULOXETINE HCL 60 MG PO CPEP: 60.0000 mg | ORAL_CAPSULE | Freq: Every day | ORAL | Status: DC

## 2012-09-04 MED ORDER — EPINEPHRINE 0.3 MG/0.3ML IJ DEVI: INTRAMUSCULAR | Status: DC

## 2012-09-04 NOTE — Telephone Encounter (Signed)
Pt lost her Rx

## 2012-09-04 NOTE — Addendum Note (Signed)
Addended by: Clint Bolder D on: 09/04/2012 10:04 AM   Modules accepted: Orders

## 2012-09-04 NOTE — Telephone Encounter (Signed)
PATIENT REQUESTING PRESCRIPTION FOR CYMBALTA 60 MG (INCREASE FROM 30 MG).  PHARMACIST NEEDS NEW PRESCRIPTION

## 2012-10-12 ENCOUNTER — Ambulatory Visit (INDEPENDENT_AMBULATORY_CARE_PROVIDER_SITE_OTHER): Payer: Managed Care, Other (non HMO) | Admitting: Family Medicine

## 2012-10-12 ENCOUNTER — Encounter: Payer: Self-pay | Admitting: Family Medicine

## 2012-10-12 VITALS — BP 124/86 | HR 98 | Wt 305.0 lb

## 2012-10-12 DIAGNOSIS — G4700 Insomnia, unspecified: Secondary | ICD-10-CM

## 2012-10-12 DIAGNOSIS — F411 Generalized anxiety disorder: Secondary | ICD-10-CM

## 2012-10-12 MED ORDER — ZALEPLON 10 MG PO CAPS: 10.0000 mg | ORAL_CAPSULE | Freq: Every day | ORAL | Status: DC

## 2012-10-12 MED ORDER — HYDROXYZINE PAMOATE 25 MG PO CAPS: ORAL_CAPSULE | ORAL | Status: DC

## 2012-10-12 NOTE — Patient Instructions (Addendum)
1)  Insomnia - Take a Flexeril at night to put you in a deeper sleep. Cut out all caffeine and add some Melatonin.  Try some Sleepy Time Tea and Sweet Dreams.  If you get to sleep and wake up then you can take Sonata as long as you  Have 4 hours left to sleep.  2)  Stress/Anxiety - Exercise to relieve your stress. Take a Vistaril if you feel that you are losing it.    Stress Stress-related medical problems are becoming increasingly common. The body has a built-in physical response to stressful situations. Faced with pressure, challenge or danger, we need to react quickly. Our bodies release hormones such as cortisol and adrenaline to help do this. These hormones are part of the "fight or flight" response and affect the metabolic rate, heart rate and blood pressure, resulting in a heightened, stressed state that prepares the body for optimum performance in dealing with a stressful situation. It is likely that early man required these mechanisms to stay alive, but usually modern stresses do not call for this, and the same hormones released in today's world can damage health and reduce coping ability. CAUSES  Pressure to perform at work, at school or in sports.  Threats of physical violence.  Money worries.  Arguments.  Family conflicts.  Divorce or separation from significant other.  Bereavement.  New job or unemployment.  Changes in location.  Alcohol or drug abuse. SOMETIMES, THERE IS NO PARTICULAR REASON FOR DEVELOPING STRESS. Almost all people are at risk of being stressed at some time in their lives. It is important to know that some stress is temporary and some is long term.  Temporary stress will go away when a situation is resolved. Most people can cope with short periods of stress, and it can often be relieved by relaxing, taking a walk, chatting through issues with friends, or having a good night's sleep.  Chronic (long-term, continuous) stress is much harder to deal with.  It can be psychologically and emotionally damaging. It can be harmful both for an individual and for friends and family. SYMPTOMS Everyone reacts to stress differently. There are some common effects that help [place] recognize it. In times of extreme stress, people may:  Shake uncontrollably.  Breathe faster and deeper than normal (hyperventilate).  Vomit.  For people with asthma, stress can trigger an attack.  For some people, stress may trigger migraine headaches, ulcers, and body pain. PHYSICAL EFFECTS OF STRESS MAY INCLUDE:  Loss of energy.  Skin problems.  Aches and pains resulting from tense muscles, including neck ache, backache and tension headaches.  Increased pain from arthritis and other conditions.  Irregular heart beat (palpitations).  Periods of irritability or anger.  Apathy or depression.  Anxiety (feeling uptight or worrying).  Unusual behavior.  Loss of appetite.  Comfort eating.  Lack of concentration.  Loss of, or decreased, sex-drive.  Increased smoking, drinking, or recreational drug use.  For women, missed periods.  Ulcers, joint pain, and muscle pain. Post-traumatic stress is the stress caused by any serious accident, strong emotional damage, or extremely difficult or violent experience such as rape or war. Post-traumatic stress victims can experience mixtures of emotions such as fear, shame, depression, guilt or anger. It may include recurrent memories or images that may be haunting. These feelings can last for weeks, months or even years after the traumatic event that triggered them. Specialized treatment, possibly with medicines and psychological therapies, is available. If stress is causing physical symptoms,  severe distress or making it difficult for you to function as normal, it is worth seeing your caregiver. It is important to remember that although stress is a usual part of life, extreme or prolonged stress can lead to other illnesses that  will need treatment. It is better to visit a doctor sooner rather than later. Stress has been linked to the development of high blood pressure and heart disease, as well as insomnia and depression. There is no diagnostic test for stress since everyone reacts to it differently. But a caregiver will be able to spot the physical symptoms, such as:  Headaches.  Shingles.  Ulcers. Emotional distress such as intense worry, low mood or irritability should be detected when the doctor asks pertinent questions to identify any underlying problems that might be the cause. In case there are physical reasons for the symptoms, the doctor may also want to do some tests to exclude certain conditions. If you feel that you are suffering from stress, try to identify the aspects of your life that are causing it. Sometimes you may not be able to change or avoid them, but even a small change can have a positive ripple effect. A simple lifestyle change can make all the difference. STRATEGIES THAT CAN HELP DEAL WITH STRESS:  Delegating or sharing responsibilities.  Avoiding confrontations.  Learning to be more assertive.  Regular exercise.  Avoid using alcohol or street drugs to cope.  Eating a healthy, balanced diet, rich in fruit and vegetables and proteins.  Finding humor or absurdity in stressful situations.  Never taking on more than you know you can handle comfortably.  Organizing your time better to get as much done as possible.  Talking to friends or family and sharing your thoughts and fears.  Listening to music or relaxation tapes.  Tensing and then relaxing your muscles, starting at the toes and working up to the head and neck. If you think that you would benefit from help, either in identifying the things that are causing your stress or in learning techniques to help you relax, see a caregiver who is capable of helping you with this. Rather than relying on medications, it is usually better to  try and identify the things in your life that are causing stress and try to deal with them. There are many techniques of managing stress including counseling, psychotherapy, aromatherapy, yoga, and exercise. Your caregiver can help you determine what is best for you. Document Released: 08/14/2002 Document Revised: 08/16/2011 Document Reviewed: 07/11/2007 [facility] Patient Information 2013 [place], [place].
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
## 2012-10-22 ENCOUNTER — Emergency Department (HOSPITAL_BASED_OUTPATIENT_CLINIC_OR_DEPARTMENT_OTHER)
Admission: EM | Admit: 2012-10-22 | Discharge: 2012-10-22 | Disposition: A | Discharge status: Home / Self Care | Payer: Managed Care, Other (non HMO) | Attending: Emergency Medicine | Admitting: Emergency Medicine

## 2012-10-22 ENCOUNTER — Emergency Department (HOSPITAL_BASED_OUTPATIENT_CLINIC_OR_DEPARTMENT_OTHER): Payer: Managed Care, Other (non HMO)

## 2012-10-22 ENCOUNTER — Encounter (HOSPITAL_BASED_OUTPATIENT_CLINIC_OR_DEPARTMENT_OTHER): Payer: Self-pay

## 2012-10-22 DIAGNOSIS — Y929 Unspecified place or not applicable: Secondary | ICD-10-CM | POA: Insufficient documentation

## 2012-10-22 DIAGNOSIS — S90129A Contusion of unspecified lesser toe(s) without damage to nail, initial encounter: Secondary | ICD-10-CM | POA: Insufficient documentation

## 2012-10-22 DIAGNOSIS — F329 Major depressive disorder, single episode, unspecified: Secondary | ICD-10-CM | POA: Insufficient documentation

## 2012-10-22 DIAGNOSIS — S90112A Contusion of left great toe without damage to nail, initial encounter: Secondary | ICD-10-CM

## 2012-10-22 DIAGNOSIS — W06XXXA Fall from bed, initial encounter: Secondary | ICD-10-CM | POA: Insufficient documentation

## 2012-10-22 DIAGNOSIS — Z8739 Personal history of other diseases of the musculoskeletal system and connective tissue: Secondary | ICD-10-CM | POA: Insufficient documentation

## 2012-10-22 DIAGNOSIS — Y9389 Activity, other specified: Secondary | ICD-10-CM | POA: Insufficient documentation

## 2012-10-22 DIAGNOSIS — F3289 Other specified depressive episodes: Secondary | ICD-10-CM | POA: Insufficient documentation

## 2012-10-22 DIAGNOSIS — Z79899 Other long term (current) drug therapy: Secondary | ICD-10-CM | POA: Insufficient documentation

## 2012-10-22 MED ORDER — HYDROCODONE-ACETAMINOPHEN 5-325 MG PO TABS: 1.0000 | ORAL_TABLET | Freq: Four times a day (QID) | ORAL | Status: DC | PRN

## 2012-10-22 MED ORDER — IBUPROFEN 800 MG PO TABS: 800.0000 mg | ORAL_TABLET | Freq: Three times a day (TID) | ORAL | Status: DC | PRN

## 2012-10-22 NOTE — ED Provider Notes (Signed)
History     CSN: [id number]  Arrival date & time 10/22/12  1220   First MD Initiated Contact with Patient 10/22/12 1246      Chief Complaint  Patient presents with  . Foot Injury    (Consider location/radiation/quality/duration/timing/severity/associated sxs/prior treatment) HPI Patient presents emergency Department with left great toe pain and right ankle pain following a fall when getting out of bed, this morning.  Patient, states, that she has no other injuries noted.  Patient has pain to the lower left great toe diffusely and to the right ankle on the middle portion of her ankle.  Patient denies head injury, loss of consciousness, chest pain, shortness of breath, syncope, dizziness, weakness, nausea, vomiting, or back pain.  Patient, states, that she did not take any medications prior to arrival for her symptoms.   Past Medical History  Diagnosis Date  . Rheumatoid arthritis   . Depression     Past Surgical History  Procedure Laterality Date  . Cesarean section    . Tubal ligation      History reviewed. No pertinent family history.  History  Substance Use Topics  . Smoking status: Never Smoker   . Smokeless tobacco: Never Used  . Alcohol Use: No    OB History   Grav Para Term Preterm Abortions TAB SAB Ect Mult Living                  Review of Systems All other systems negative except as documented in the HPI. All pertinent positives and negatives as reviewed in the HPI. Allergies  Flu virus vaccine  Home Medications   Current Outpatient Rx  Name  Route  Sig  Dispense  Refill  . Adalimumab (HUMIRA [place])   Subcutaneous   Inject into the skin. Administer SQ injection once every two weeks.         . cyclobenzaprine (FLEXERIL) 10 MG tablet   Oral   Take 10 mg by mouth 3 (three) times daily as needed for muscle spasms.         . DULoxetine (CYMBALTA) 60 MG capsule   Oral   Take 1 capsule (60 mg total) by mouth daily.   30 capsule   5   .  famciclovir (FAMVIR) 500 MG tablet   Oral   Take 500 mg by mouth 3 (three) times daily. Take three tablets at onset of fever blisters x 1 day         . HYDROcodone-acetaminophen (NORCO) 5-325 MG per tablet   Oral   Take 1 tablet by mouth every 6 (six) hours as needed.         . hydrOXYzine (VISTARIL) 25 MG capsule      Take 1 tab po daily as needed for increased anxiety   30 capsule   1   . methotrexate 1 G injection   Intravenous   Inject into the vein once.         . zaleplon (SONATA) 10 MG capsule   Oral   Take 1 capsule (10 mg total) by mouth at bedtime.   30 capsule   0   . EPINEPHrine (EPIPEN) 0.3 mg/0.3 mL DEVI      Inject into right thigh for severe allergic reaction and then call 911   2 Device   1   . etanercept (ENBREL) 50 MG/ML injection   Subcutaneous   Inject 50 mg into the skin once a week.  BP 137/92  Pulse 85  Temp(Src) 98.2 F (36.8 C) (Oral)  Resp 20  Ht 5\' 2"  (1.575 m)  Wt 306 lb (138.801 kg)  BMI 55.95 kg/m2  SpO2 97%  Physical Exam  Nursing note and vitals reviewed. Constitutional: She is oriented to person, place, and time. She appears well-developed and well-nourished. No distress.  HENT:  Head: Normocephalic and atraumatic.  Neck: Normal range of motion. Neck supple.  Cardiovascular: Normal rate, regular rhythm and normal heart sounds.   Pulmonary/Chest: Effort normal.  Musculoskeletal:       Feet:  Neurological: She is alert and oriented to person, place, and time. She exhibits normal muscle tone. Coordination normal.  Skin: Skin is warm and dry.    ED Course  Procedures (including critical care time)  Labs Reviewed - No data to display Dg Ankle Complete Right  10/22/2012   *RADIOLOGY REPORT*  Clinical Data: Pain after fall  RIGHT ANKLE - COMPLETE 3+ VIEW  Comparison: Right foot radiographs 10/18/2011  Findings: Ankle mortise is intact.  No acute or healing fracture is identified.  Small calcaneal plantar  spur noted.  No discrete focal soft tissue swelling is seen.  IMPRESSION: No acute bony abnormality.   Original Report Authenticated By: Britta Mccreedy, M.D.   Dg Toe Great Left  10/22/2012   *RADIOLOGY REPORT*  Clinical Data: Foot injury.  The toe backwards.  Pain.  LEFT GREAT TOE  Comparison: Left foot radiographs 01/22/2005  Findings: The toe is flexed at the metatarsophalangeal joint.  Bony mineralization is normal.  No acute or healing fracture is identified.  No focal soft tissue swelling should radiographically. No radiopaque foreign body.  IMPRESSION: No acute bony abnormality.   Original Report Authenticated By: Britta Mccreedy, M.D.   patient be treated for a sprained ankle and left great toe injury.  Patient is advised to return here for any worsening in her condition.  I also asked her to followup with her primary care doctor.  Ice and elevate the foot and toe, along with her ankle.   MDM         Carlyle Dolly, PA-C 10/22/12 1444

## 2012-10-22 NOTE — ED Notes (Signed)
Pt states that she stepped out of bed, bent her L great toe backwards and heard/felt a crunch.  Believes it is broken.

## 2012-10-24 NOTE — ED Provider Notes (Signed)
Medical screening examination/treatment/procedure(s) were performed by non-physician practitioner and as supervising physician I was immediately available for consultation/collaboration.    Kaulin Chaves J. Maui Britten, MD 10/24/12 0706 

## 2012-11-14 ENCOUNTER — Ambulatory Visit (INDEPENDENT_AMBULATORY_CARE_PROVIDER_SITE_OTHER): Payer: 59 | Admitting: Family Medicine

## 2012-11-14 ENCOUNTER — Encounter: Payer: Self-pay | Admitting: Family Medicine

## 2012-11-14 VITALS — BP 141/79 | HR 86 | Wt 300.0 lb

## 2012-11-14 DIAGNOSIS — F4389 Other reactions to severe stress: Secondary | ICD-10-CM

## 2012-11-14 DIAGNOSIS — F438 Other reactions to severe stress: Secondary | ICD-10-CM

## 2012-11-14 DIAGNOSIS — R040 Epistaxis: Secondary | ICD-10-CM

## 2012-11-14 DIAGNOSIS — F4329 Adjustment disorder with other symptoms: Secondary | ICD-10-CM

## 2012-11-14 MED ORDER — DULOXETINE HCL 30 MG PO CPEP: 30.0000 mg | ORAL_CAPSULE | Freq: Every day | ORAL | Status: DC

## 2012-11-14 NOTE — Patient Instructions (Addendum)
Diet Following Bariatric Surgery The bariatric diet is designed to provide fluids and nourishment while promoting weight loss after bariatric surgery. The diet is divided into 3 stages. The rate of progression varies based on individual food tolerance. DIET FOLLOWING BARIATRIC SURGERY The diet following surgery is divided into 3 stages to allow a gradual adjustment. It is very important to the success of your surgery to:  Progress to each stage slowly.  Eat at set times.  Chew food well and stop eating when you are full.  Not drink liquids 30 minutes before and after meals. If you feel tightness or pressure in your chest, that means you are full. Wait 30 minutes before you try to eat again. STAGE 1 BARIATRIC DIET - ABOUT 2 WEEKS IN DURATION   The diet begins the day of surgery. It will last about 1 to 2 weeks after surgery. Your surgeon may have individual guidelines for you about specific foods or the progression of your diet. Follow your surgeon's guidelines.  If clear liquids are well-tolerated without vomiting, your caregiver will add a 4 oz to 6 oz high protein, low-calorie liquid supplement. You could add this to your meal plan 3 times daily. You will need at least 60 g to 80 g of protein daily or as determined by your Registered Dietitian.  Guidelines for choosing a protein supplement include:  At least 15 g of protein per 8 oz serving.  Less than 20 g total carbohydrate per 8 oz serving.  Less than 5 g fat per 8 oz serving.  Avoid carbonated beverages, caffeine, alcohol, and concentrated sweets such as sugar, cakes, and cookies.  Right after surgery, you may only be able to eat 3 to 4 tsp per meal. Your maximum volume should not exceed  to  cup total. Do not eat or drink more than 1 oz or 2 tbs every 15 minutes.  Take a chewable multivitamin and mineral supplement.  Drink at least 48 oz of fluid daily, which includes your protein supplement. Food and beverages from the  list below are allowed at set times (for example at 8 AM, 12 noon, or 5 PM):  Decaffeinated coffee or tea.  100% fruit juice.  Diet or sugar-free drinks.  Broth.  Blenderized soup.  Skim milk or lactose-free milk.  Sugar-free gelatin dessert or frozen ice pops.  Mashed potatoes.  Yogurt (artificially sweetened).  Sugar-free pudding.  Blended low-fat cottage cheese.  Unsweetened applesauce, grits, or hot wheat cereal. Four to six ounces of a liquid protein supplement from the list below is recommended for snacks at 10 AM, 2 PM, and 8 PM.  STAGE 2 BARIATRIC DIET (SOFT DIET) - ABOUT 4 WEEKS IN DURATION  About 2 weeks after surgery, your caregiver will progress your diet to this stage. Foods may need to be blended to the consistency of applesauce. Choose low-fat foods (less than 5 g of fat per serving) and avoid concentrated sweets and sugar (less than 10 g of sugar per serving). Meals should not exceed  to  cup total. This stage will last about 4 weeks. It is recommended that you meet with your dietitian at this stage to begin preparation for the last stage. This stage consists of 3 meals a day with a liquid protein supplement between meals twice daily. Do not drink liquids with foods. You must wait 30 minutes for the stomach pouch to empty before drinking. Chew food well. The food must be almost liquified before swallowing. Soft foods from the   list below can now be slowly added to your diet:  Soft fruit (soft canned fruit in light syrup or natural juice, banana, melon, peaches, pears, or strawberries).  Cooked vegetables.  Toast or crackers (becomes soft after chewing 20 times).  Hot wheat cereal.  Fish.  Eggs (scrambled, soft-boiled). STAGE 3 BARIATRIC DIET (REGULAR DIET) - ABOUT 6 to 8 WEEKS AFTER SURGERY About 6 to 8 weeks after surgery, you will be advanced to food that is regular in texture. This diet should include all food groups. The diet will continue to promote  weight loss. Meals should not exceed  to 1 cup total. Your dietitian will be available to assist you in meal planning and additional behavioral strategies to make this final stage a long-term success. Slowly add foods of regular consistency and remember:  Eat only at your chosen meal times.  Minimize drinking with meals. You should drink 30 minutes before eating. Do not start drinking again for about 2 hours after eating.  Chew food well. Take small bites.  Think about the portion size of a healthy frozen meal. You will be able to eat most of this.  Make sure your meal is balanced with starch, protein, fruits, and vegetables.  When you feel full, stop eating. Document Released: 11/28/2002 Document Revised: 08/16/2011 Document Reviewed: 08/21/2010 [employer] Patient Information 2014 [employer].  

## 2012-11-21 DIAGNOSIS — Z6841 Body Mass Index (BMI) 40.0 and over, adult: Secondary | ICD-10-CM

## 2012-11-21 DIAGNOSIS — Z903 Acquired absence of stomach [part of]: Secondary | ICD-10-CM | POA: Insufficient documentation

## 2012-11-21 HISTORY — PX: LAPAROSCOPIC GASTRIC SLEEVE RESECTION: SHX5895

## 2012-11-29 DIAGNOSIS — R109 Unspecified abdominal pain: Secondary | ICD-10-CM | POA: Insufficient documentation

## 2012-12-09 DIAGNOSIS — R040 Epistaxis: Secondary | ICD-10-CM | POA: Insufficient documentation

## 2012-12-09 DIAGNOSIS — F4329 Adjustment disorder with other symptoms: Secondary | ICD-10-CM | POA: Insufficient documentation

## 2012-12-09 NOTE — Assessment & Plan Note (Signed)
She will use some normal saline gel vs Vaseline to keep her nose moist.

## 2012-12-09 NOTE — Assessment & Plan Note (Signed)
She was given a prescription for Cymbalta.   

## 2012-12-09 NOTE — Progress Notes (Signed)
  Subjective:    Patient ID: [name] [name], female    DOB: 1966-03-16, 47 y.o.   MRN: [id number]  HPI  [name] [name] is here to discuss a couple of issues.  She has not been feeling well for a couple of days.  She has had a couple of nosebleeds. She is currently holding her medications because she is going for gastric bypass surgery on Tuesday.  She has been very stressed lately.     Review of Systems  Constitutional: Positive for fatigue. Negative for fever and unexpected weight change.  HENT: Positive for nosebleeds.   Respiratory: Negative for chest tightness and shortness of breath.   Cardiovascular: Negative for chest pain, palpitations and leg swelling.  Psychiatric/Behavioral:       Stressed    Past Medical History  Diagnosis Date  . Rheumatoid arthritis(714.0)   . Depression    Family History  Problem Relation Age of Onset  . Hyperlipidemia Mother    History   Social History Narrative   Marital Status: Married Probation officer)    Children:  Son [name]) Daughter [name])    Pets: None    Living Situation: Lives with husband and children    Occupation: Occupational psychologist - Community education officer    Education: Bachelor's Degree - Psychology    Tobacco Use/Exposure:  None    Alcohol Use:  Occasional   Drug Use:  None   Diet:  Regular   Exercise: Walking or Treadmill (twice per week)   Hobbies: Crafting, Christmas Decorations.                Objective:   Physical Exam  Constitutional: She appears well-nourished. No distress.  HENT:  Head: Normocephalic.  Nose: Nose normal.  Eyes: No scleral icterus.  Neck: No thyromegaly present.  Cardiovascular: Normal rate, regular rhythm and normal heart sounds.   Pulmonary/Chest: Effort normal and breath sounds normal.  Abdominal: There is no tenderness.  Musculoskeletal: She exhibits no edema and no tenderness.  Neurological: She is alert.  Skin: Skin is warm and dry.  Psychiatric: She has a normal mood and affect.  Her behavior is normal. Judgment and thought content normal.       Assessment & Plan:

## 2012-12-19 ENCOUNTER — Ambulatory Visit (INDEPENDENT_AMBULATORY_CARE_PROVIDER_SITE_OTHER): Payer: Managed Care, Other (non HMO) | Admitting: Family Medicine

## 2012-12-19 ENCOUNTER — Encounter: Payer: Self-pay | Admitting: Family Medicine

## 2012-12-19 VITALS — BP 121/87 | HR 68 | Wt 272.0 lb

## 2012-12-19 DIAGNOSIS — R5383 Other fatigue: Secondary | ICD-10-CM

## 2012-12-19 DIAGNOSIS — R5381 Other malaise: Secondary | ICD-10-CM

## 2012-12-19 MED ORDER — MUPIROCIN CALCIUM 2 % EX CREA: TOPICAL_CREAM | CUTANEOUS | Status: DC

## 2012-12-19 MED ORDER — CYANOCOBALAMIN 1000 MCG/ML IJ SOLN
1000.0000 ug | Freq: Once | INTRAMUSCULAR | Status: AC
  Administered 2012-12-19: 1000 ug via INTRAMUSCULAR

## 2012-12-19 NOTE — Patient Instructions (Addendum)

## 2012-12-19 NOTE — Progress Notes (Signed)
  Subjective:    Patient ID: [name] [name], female    DOB: Oct 01, 1965, 47 y.o.   MRN: [id number]  HPI  [name] [name] is here today to discuss her fatigue.  She underwent gastric surgery on 11/21/12. She was also found to have a hernia that was repaired during her surgery.  Overall, she has done well since her surgery.  She has lost 23 pounds since her last visit on 11/14/12.  She does feel tired and thinks that she might need some Vitamin B12.     Review of Systems  Constitutional: Positive for fatigue.  HENT: Negative.   Eyes: Negative.   Respiratory: Negative.   Cardiovascular: Negative.   Gastrointestinal: Negative.   Endocrine: Negative.   Genitourinary: Negative.   Musculoskeletal: Negative.   Skin: Negative.   Allergic/Immunologic: Negative.   Neurological: Negative.   Hematological: Negative.   Psychiatric/Behavioral: Negative.     Past Medical History  Diagnosis Date  . Rheumatoid arthritis(714.0)   . Depression   . Morbid obesity   . HSV-1 infection     Family History  Problem Relation Age of Onset  . Hyperlipidemia Mother     History   Social History Narrative   Marital Status: Married Probation officer)    Children:  Son [name]) Daughter [name])    Pets: None    Living Situation: Lives with husband and children    Occupation: Occupational psychologist Administrator)     Education: Oncologist (Psychology)     Tobacco Use/Exposure:  None    Alcohol Use:  Occasional   Drug Use:  None   Diet:  Regular   Exercise: Walking or Treadmill (2 x week)   Hobbies: Clinical cytogeneticist, Christmas Decorations.                 Objective:   Physical Exam  Constitutional: She appears well-nourished. No distress.  HENT:  Head: Normocephalic.  Eyes: No scleral icterus.  Neck: No thyromegaly present.  Cardiovascular: Normal rate, regular rhythm and normal heart sounds.   Pulmonary/Chest: Effort normal and breath sounds normal.  Abdominal: There is no tenderness.   Musculoskeletal: She exhibits no edema and no tenderness.  Neurological: She is alert.  Skin: Skin is warm and dry.  Psychiatric: She has a normal mood and affect. Her behavior is normal. Judgment and thought content normal.          Assessment & Plan:

## 2012-12-20 ENCOUNTER — Ambulatory Visit: Payer: 59 | Admitting: Family Medicine

## 2013-01-04 ENCOUNTER — Emergency Department (HOSPITAL_BASED_OUTPATIENT_CLINIC_OR_DEPARTMENT_OTHER)
Admission: EM | Admit: 2013-01-04 | Discharge: 2013-01-04 | Disposition: A | Discharge status: Home / Self Care | Payer: Managed Care, Other (non HMO) | Attending: Emergency Medicine | Admitting: Emergency Medicine

## 2013-01-04 ENCOUNTER — Encounter (HOSPITAL_BASED_OUTPATIENT_CLINIC_OR_DEPARTMENT_OTHER): Payer: Self-pay | Admitting: *Deleted

## 2013-01-04 DIAGNOSIS — R0789 Other chest pain: Secondary | ICD-10-CM

## 2013-01-04 DIAGNOSIS — R51 Headache: Secondary | ICD-10-CM | POA: Insufficient documentation

## 2013-01-04 DIAGNOSIS — F3289 Other specified depressive episodes: Secondary | ICD-10-CM | POA: Insufficient documentation

## 2013-01-04 DIAGNOSIS — F329 Major depressive disorder, single episode, unspecified: Secondary | ICD-10-CM | POA: Insufficient documentation

## 2013-01-04 DIAGNOSIS — Z79899 Other long term (current) drug therapy: Secondary | ICD-10-CM | POA: Insufficient documentation

## 2013-01-04 DIAGNOSIS — M069 Rheumatoid arthritis, unspecified: Secondary | ICD-10-CM | POA: Insufficient documentation

## 2013-01-04 LAB — COMPREHENSIVE METABOLIC PANEL
ALT: 28 U/L (ref 0–35)
Albumin: 3.5 g/dL (ref 3.5–5.2)
Alkaline Phosphatase: 72 U/L (ref 39–117)
BUN: 10 mg/dL (ref 6–23)
Chloride: 104 mEq/L (ref 96–112)
Glucose, Bld: 77 mg/dL (ref 70–99)
Potassium: 3.3 mEq/L — ABNORMAL LOW (ref 3.5–5.1)
Total Bilirubin: 0.4 mg/dL (ref 0.3–1.2)

## 2013-01-04 LAB — CBC WITH DIFFERENTIAL/PLATELET
Basophils Relative: 0 % (ref 0–1)
Hemoglobin: 13.3 g/dL (ref 12.0–15.0)
Lymphs Abs: 4.3 10*3/uL — ABNORMAL HIGH (ref 0.7–4.0)
Monocytes Relative: 9 % (ref 3–12)
Neutro Abs: 3.5 10*3/uL (ref 1.7–7.7)
Neutrophils Relative %: 40 % — ABNORMAL LOW (ref 43–77)
RBC: 4.82 MIL/uL (ref 3.87–5.11)

## 2013-01-04 LAB — TROPONIN I: Troponin I: <0.3 ng/mL (ref <0.30)

## 2013-01-04 MED ORDER — SODIUM CHLORIDE 0.9 % IV BOLUS (SEPSIS)
1000.0000 mL | Freq: Once | INTRAVENOUS | Status: AC
  Administered 2013-01-04: 1000 mL via INTRAVENOUS

## 2013-01-04 MED ORDER — ONDANSETRON HCL 4 MG/2ML IJ SOLN
4.0000 mg | Freq: Once | INTRAMUSCULAR | Status: AC
  Administered 2013-01-04: 4 mg via INTRAVENOUS
  Filled 2013-01-04: qty 2

## 2013-01-04 MED ORDER — KETOROLAC TROMETHAMINE 30 MG/ML IJ SOLN
30.0000 mg | Freq: Once | INTRAMUSCULAR | Status: AC
  Administered 2013-01-04: 30 mg via INTRAVENOUS
  Filled 2013-01-04: qty 1

## 2013-01-04 NOTE — ED Notes (Signed)
Headache and chest pain x 20 minutes.

## 2013-01-04 NOTE — ED Notes (Signed)
RN Earlene Plater has checked on Pt. Son in room 11.  Pt. Made aware of Pt. Being fine and being cared for by ED staff.  Pt. Made aware of the Pt. / son in no distress and no discomfort at present time.  Pt. Asking to see her son and RN explained that the son is going to radiology and she can't leave her room due to IV and her complaints of chest pain with multiple complaints.  Pt. Is in no distress and continues to ask questions that are hard for RN to answer due to questions being inappropriate.  Pt. Has social issues that she crys about with her husband and children and self.  Pt. Crys when she reports she has to go back to work tomorrow.

## 2013-01-04 NOTE — ED Provider Notes (Signed)
CSN: [id number]     Arrival date & time 01/04/13  1527 History     First MD Initiated Contact with Patient 01/04/13 1545     Chief Complaint  Patient presents with  . Chest Pain  . Headache   (Consider location/radiation/quality/duration/timing/severity/associated sxs/prior Treatment) HPI Comments: Patient presents with tightness in the chest which began approximately 30 minutes prior to signing in. Patient reports that she has had excessive stress in her life for the past several weeks. This past weekend she tells me that her husband experienced a heart attack and was in the hospital and in [place]. She has been looking after him and she is quite concerned about his well-being. Today she brought her son in to be seen for abdominal pain. On the way here she began to experience tightness in the chest headache and just generally feels bad.  She denies any prior cardiac history but does report that she had a LAP-BAND performed 1 or 2 months ago. She denies any abdominal pain or vomiting. She does feel somewhat nauseated. And there are no cardiac risk factors   Patient is a 47 y.o. female presenting with chest pain and headaches. The history is provided by the patient.  Chest Pain Chest pain location: upper chest. Pain quality: aching   Pain radiates to:  Does not radiate Pain radiates to the back: no   Pain severity:  Moderate Onset quality:  Sudden Duration:  30 minutes Timing:  Constant Progression:  Unchanged Chronicity:  New Context: stress   Context: not breathing   Relieved by:  Nothing Associated symptoms: headache   Headache   Past Medical History  Diagnosis Date  . Rheumatoid arthritis(714.0)   . Depression    Past Surgical History  Procedure Laterality Date  . Cesarean section    . Tubal ligation     Family History  Problem Relation Age of Onset  . Hyperlipidemia Mother    History  Substance Use Topics  . Smoking status: Never Smoker   . Smokeless tobacco:  Never Used  . Alcohol Use: No   OB History   Grav Para Term Preterm Abortions TAB SAB Ect Mult Living                 Review of Systems  Cardiovascular: Positive for chest pain.  Neurological: Positive for headaches.  All other systems reviewed and are negative.    Allergies  Flu virus vaccine  Home Medications   Current Outpatient Rx  Name  Route  Sig  Dispense  Refill  . Adalimumab (HUMIRA [place])   Subcutaneous   Inject into the skin. Administer SQ injection once every two weeks.         . cyclobenzaprine (FLEXERIL) 10 MG tablet   Oral   Take 10 mg by mouth 3 (three) times daily as needed for muscle spasms.         . DULoxetine (CYMBALTA) 30 MG capsule   Oral   Take 1 capsule (30 mg total) by mouth daily.   30 capsule   5   . DULoxetine (CYMBALTA) 60 MG capsule   Oral   Take 1 capsule (60 mg total) by mouth daily.   30 capsule   5   . EPINEPHrine (EPIPEN) 0.3 mg/0.3 mL DEVI      Inject into right thigh for severe allergic reaction and then call 911   2 Device   1   . etanercept (ENBREL) 50 MG/ML injection   Subcutaneous  Inject 50 mg into the skin once a week.           . famciclovir (FAMVIR) 500 MG tablet   Oral   Take 500 mg by mouth 3 (three) times daily. Take three tablets at onset of fever blisters x 1 day         . HYDROcodone-acetaminophen (NORCO) 5-325 MG per tablet   Oral   Take 1 tablet by mouth every 6 (six) hours as needed.         Marland Kitchen HYDROcodone-acetaminophen (NORCO/VICODIN) 5-325 MG per tablet   Oral   Take 1 tablet by mouth every 6 (six) hours as needed for pain.   15 tablet   0   . hydrOXYzine (VISTARIL) 25 MG capsule      Take 1 tab po daily as needed for increased anxiety   30 capsule   1   . ibuprofen (ADVIL,MOTRIN) 800 MG tablet   Oral   Take 1 tablet (800 mg total) by mouth every 8 (eight) hours as needed for pain.   21 tablet   0   . methotrexate 1 G injection   Intravenous   Inject into the vein once.          . mupirocin cream (BACTROBAN) 2 %      Apply to affected area 3 times daily   30 g   0   . omeprazole (PRILOSEC) 20 MG capsule   Oral   Take 20 mg by mouth daily.         . sennosides-docusate sodium (SENOKOT-S) 8.6-50 MG tablet   Oral   Take 1 tablet by mouth daily.         . ursodiol (ACTIGALL) 300 MG capsule   Oral   Take 300 mg by mouth 2 (two) times daily.         . zaleplon (SONATA) 10 MG capsule   Oral   Take 1 capsule (10 mg total) by mouth at bedtime.   30 capsule   0    BP 110/82  Pulse 81  Temp(Src) 98.3 F (36.8 C) (Oral)  Resp 14  Wt 272 lb (123.378 kg)  BMI 49.74 kg/m2  SpO2 98% Physical Exam  Nursing note and vitals reviewed. Constitutional: She is oriented to person, place, and time. She appears well-developed and well-nourished. No distress.  HENT:  Head: Normocephalic and atraumatic.  Neck: Normal range of motion. Neck supple.  Cardiovascular: Normal rate and regular rhythm.  Exam reveals no gallop and no friction rub.   No murmur heard. Pulmonary/Chest: Effort normal and breath sounds normal. No respiratory distress. She has no wheezes.  Abdominal: Soft. Bowel sounds are normal. She exhibits no distension. There is no tenderness.  The abdomen is obese. There is no tenderness to palpation  Musculoskeletal: Normal range of motion.  Neurological: She is alert and oriented to person, place, and time.  Skin: Skin is warm and dry. She is not diaphoretic.    ED Course   Procedures (including critical care time)  Labs Reviewed - No data to display No results found. No diagnosis found.   Date: 01/04/2013  Rate: 66  Rhythm: normal sinus rhythm  QRS Axis: normal  Intervals: normal  ST/T Wave abnormalities: normal  Conduction Disutrbances:none  Narrative Interpretation:   Old EKG Reviewed: none available    MDM  The patient presents here with complaints of chest pain that are atypical for cardiac pain.  The EKG is  unremarkable and the troponin is  negative. She is feeling better with medications given in the ER and has had no further discomfort. She is conveyed to me that she is having excessive stress to her husband falling ill and now her son having abdominal pain and being seen in the ER at the same time. I have reassured her that the workup is unremarkable and I agree that her symptoms are related to stress. At this point I believe she is stable for discharge to home. I advised her the best thing she can do right now beginning to night sleep as she tells me she has not slept for several days. She will be discharged and return to the ER should she experience further problems  Geoffery Lyons, MD 01/04/13 1710

## 2013-01-08 ENCOUNTER — Encounter: Payer: Self-pay | Admitting: Family Medicine

## 2013-01-08 ENCOUNTER — Ambulatory Visit (INDEPENDENT_AMBULATORY_CARE_PROVIDER_SITE_OTHER): Payer: Managed Care, Other (non HMO) | Admitting: Family Medicine

## 2013-01-08 VITALS — BP 120/86 | HR 83 | Resp 16 | Ht 61.5 in | Wt 265.0 lb

## 2013-01-08 DIAGNOSIS — F4389 Other reactions to severe stress: Secondary | ICD-10-CM

## 2013-01-08 DIAGNOSIS — F4329 Adjustment disorder with other symptoms: Secondary | ICD-10-CM

## 2013-01-08 DIAGNOSIS — F438 Other reactions to severe stress: Secondary | ICD-10-CM

## 2013-01-08 NOTE — Progress Notes (Signed)
  Subjective:    Patient ID: [name] [name], female    DOB: [birth year], 47 y.o.   MRN: [id number]  HPI  [name] [name] is here today to have FMLA forms completed.  She has been feeling very overwhelmed since her husband's heart attack.  She has been tearful and is fearful of her husband's health.  She feels that she has not been coping very well.  She continues to watch him because he is still very weak.  She wants to be with him until he returns to work.     Review of Systems  Constitutional: Positive for fatigue.  Respiratory: Negative for shortness of breath.   Cardiovascular: Negative for chest pain, palpitations and leg swelling.  Psychiatric/Behavioral: Positive for sleep disturbance and decreased concentration. The patient is nervous/anxious.        Stressed, Tearful & Fearful      Past Medical History  Diagnosis Date  . Rheumatoid arthritis(714.0)   . Depression   . Morbid obesity   . HSV-1 infection     Past Surgical History  Procedure Laterality Date  . Cesarean section    . Tubal ligation    . Bariatric surgery      Family History  Problem Relation Age of Onset  . Hyperlipidemia Mother     History   Social History Narrative   Marital Status: Married Probation officer)    Children:  Son [name] [name]) Daughter [name] [name])    Pets: None    Living Situation: Lives with husband and children    Occupation: Occupational psychologist - Community education officer    Education: Bachelor's Degree - Psychology    Tobacco Use/Exposure:  None    Alcohol Use:  Occasional   Drug Use:  None   Diet:  Regular   Exercise: Walking or Treadmill (twice per week)   Hobbies: Crafting, Christmas Decorations.                Objective:   Physical Exam  Vitals reviewed. Constitutional: She is oriented to person, place, and time.  Eyes: Conjunctivae are normal. No scleral icterus.  Neck: Neck supple. No thyromegaly present.  Cardiovascular: Normal rate, regular rhythm and normal heart sounds.    Pulmonary/Chest: Effort normal and breath sounds normal.  Musculoskeletal: She exhibits no edema and no tenderness.  Lymphadenopathy:    She has no cervical adenopathy.  Neurological: She is alert and oriented to person, place, and time.  Skin: Skin is warm and dry.  Psychiatric: Her behavior is normal. Judgment and thought content normal.  She appears to be stressed/frustrated.            Assessment & Plan:

## 2013-01-16 ENCOUNTER — Telehealth: Payer: Self-pay | Admitting: *Deleted

## 2013-01-16 MED ORDER — ALCOHOL SWABS PADS: 1.0000 | MEDICATED_PAD | Freq: Every day | Status: DC

## 2013-01-16 NOTE — Telephone Encounter (Signed)
Epi-pen Rx was given to her on 08/2012 (printed) but she did not bring it to her pharmacy. I couldn't reorder it.  It said unavailable.  Can you please reorder this for her.  Thanks PG

## 2013-01-16 NOTE — Telephone Encounter (Signed)
Please just call the pharmacy downstairs and have them fill it.  It looks like a call came which was sent to you about her needing a prescription for 60 mg of Cymbalta.  From what I can tell you did a prescription for the Cymbalta and Epi-Pen or at least it is under your name.  Who know who did it?!    Anyway, this is something that you can call the pharmacy and just do and I don't need to see it.  If there is ANYTHING that you can do and I don't have to do the PLEASE do it.  You have my total permission to keep me from seeing stuff that I don't have to do.  :)

## 2013-01-17 NOTE — Telephone Encounter (Signed)
Yes ma'am! 

## 2013-01-21 ENCOUNTER — Encounter: Payer: Self-pay | Admitting: Family Medicine

## 2013-01-21 DIAGNOSIS — R5381 Other malaise: Secondary | ICD-10-CM | POA: Insufficient documentation

## 2013-01-21 NOTE — Addendum Note (Signed)
Addended by: Birdena Jubilee on: 01/21/2013 11:48 AM   Modules accepted: Level of Service

## 2013-01-21 NOTE — Assessment & Plan Note (Signed)
She was given an injection of Vitamin B-12.

## 2013-02-06 ENCOUNTER — Ambulatory Visit (INDEPENDENT_AMBULATORY_CARE_PROVIDER_SITE_OTHER): Payer: Managed Care, Other (non HMO) | Admitting: *Deleted

## 2013-02-06 DIAGNOSIS — R5381 Other malaise: Secondary | ICD-10-CM

## 2013-02-06 MED ORDER — CYANOCOBALAMIN 1000 MCG/ML IJ SOLN
1000.0000 ug | Freq: Once | INTRAMUSCULAR | Status: AC
  Administered 2013-02-06: 1000 ug via INTRAMUSCULAR

## 2013-02-24 ENCOUNTER — Encounter: Payer: Self-pay | Admitting: Family Medicine

## 2013-02-24 NOTE — Assessment & Plan Note (Signed)
FMLA forms were completed.

## 2013-03-01 ENCOUNTER — Encounter (HOSPITAL_BASED_OUTPATIENT_CLINIC_OR_DEPARTMENT_OTHER): Payer: Self-pay | Admitting: Student

## 2013-03-01 ENCOUNTER — Emergency Department (HOSPITAL_BASED_OUTPATIENT_CLINIC_OR_DEPARTMENT_OTHER)
Admission: EM | Admit: 2013-03-01 | Discharge: 2013-03-01 | Disposition: A | Discharge status: Home / Self Care | Payer: Managed Care, Other (non HMO) | Attending: Emergency Medicine | Admitting: Emergency Medicine

## 2013-03-01 DIAGNOSIS — F3289 Other specified depressive episodes: Secondary | ICD-10-CM | POA: Insufficient documentation

## 2013-03-01 DIAGNOSIS — R51 Headache: Secondary | ICD-10-CM | POA: Insufficient documentation

## 2013-03-01 DIAGNOSIS — Z79899 Other long term (current) drug therapy: Secondary | ICD-10-CM | POA: Insufficient documentation

## 2013-03-01 DIAGNOSIS — Z8619 Personal history of other infectious and parasitic diseases: Secondary | ICD-10-CM | POA: Insufficient documentation

## 2013-03-01 DIAGNOSIS — J069 Acute upper respiratory infection, unspecified: Secondary | ICD-10-CM | POA: Insufficient documentation

## 2013-03-01 DIAGNOSIS — R0789 Other chest pain: Secondary | ICD-10-CM | POA: Insufficient documentation

## 2013-03-01 DIAGNOSIS — M069 Rheumatoid arthritis, unspecified: Secondary | ICD-10-CM | POA: Insufficient documentation

## 2013-03-01 DIAGNOSIS — F329 Major depressive disorder, single episode, unspecified: Secondary | ICD-10-CM | POA: Insufficient documentation

## 2013-03-01 DIAGNOSIS — R0602 Shortness of breath: Secondary | ICD-10-CM | POA: Insufficient documentation

## 2013-03-01 DIAGNOSIS — J329 Chronic sinusitis, unspecified: Secondary | ICD-10-CM | POA: Insufficient documentation

## 2013-03-01 MED ORDER — MOMETASONE FUROATE 50 MCG/ACT NA SUSP: 2.0000 | Freq: Every day | NASAL | Status: DC

## 2013-03-01 MED ORDER — BENZONATATE 100 MG PO CAPS: 100.0000 mg | ORAL_CAPSULE | Freq: Three times a day (TID) | ORAL | Status: DC

## 2013-03-01 MED ORDER — OXYMETAZOLINE HCL 0.05 % NA SOLN: 2.0000 | Freq: Two times a day (BID) | NASAL | Status: DC

## 2013-03-01 NOTE — ED Provider Notes (Signed)
CSN: [id number]     Arrival date & time 03/01/13  1315 History   First MD Initiated Contact with Patient 03/01/13 1324     Chief Complaint  Patient presents with  . Shortness of Breath  . Cough  . Nasal Congestion   (Consider location/radiation/quality/duration/timing/severity/associated sxs/prior Treatment) HPI Patient has 2 days of URI symptoms. States that her son had similar symptoms and was seen yesterday. She complains of facial pressure and pain. She's had some nasal congestion, chest congestion, nonproductive cough, chest wall pain with coughing and deep breathing and mild shortness of breath. She's had no recorded fevers. Patient has no visual disturbance, focal weakness or numbness. She has no lower extremity edema or pain. Past Medical History  Diagnosis Date  . Rheumatoid arthritis(714.0)   . Depression   . Morbid obesity   . HSV-1 infection    Past Surgical History  Procedure Laterality Date  . Cesarean section    . Tubal ligation    . Bariatric surgery     Family History  Problem Relation Age of Onset  . Hyperlipidemia Mother    History  Substance Use Topics  . Smoking status: Never Smoker   . Smokeless tobacco: Never Used  . Alcohol Use: No   OB History   Grav Para Term Preterm Abortions TAB SAB Ect Mult Living                 Review of Systems  Constitutional: Positive for fatigue. Negative for fever and chills.  HENT: Positive for ear pain, congestion and sinus pressure. Negative for sore throat, rhinorrhea, neck pain and neck stiffness.   Eyes: Negative for visual disturbance.  Respiratory: Positive for cough, chest tightness and shortness of breath. Negative for wheezing.   Cardiovascular: Positive for chest pain. Negative for palpitations and leg swelling.  Gastrointestinal: Negative for nausea, vomiting, abdominal pain, diarrhea and constipation.  Musculoskeletal: Positive for myalgias. Negative for back pain.  Skin: Negative for rash and wound.   Neurological: Positive for headaches. Negative for dizziness, weakness, light-headedness and numbness.  All other systems reviewed and are negative.    Allergies  Flu virus vaccine  Home Medications   Current Outpatient Rx  Name  Route  Sig  Dispense  Refill  . Adalimumab (HUMIRA [place])   Subcutaneous   Inject into the skin. Administer SQ injection once every two weeks.         . Alcohol Swabs PADS   Does not apply   1 each by Does not apply route daily.   100 each   3   . benzonatate (TESSALON) 100 MG capsule   Oral   Take 1 capsule (100 mg total) by mouth every 8 (eight) hours.   21 capsule   0   . cyclobenzaprine (FLEXERIL) 10 MG tablet   Oral   Take 10 mg by mouth 3 (three) times daily as needed for muscle spasms.         . DULoxetine (CYMBALTA) 30 MG capsule   Oral   Take 1 capsule (30 mg total) by mouth daily.   30 capsule   5   . DULoxetine (CYMBALTA) 60 MG capsule   Oral   Take 1 capsule (60 mg total) by mouth daily.   30 capsule   5   . EPINEPHrine (EPIPEN) 0.3 mg/0.3 mL DEVI      Inject into right thigh for severe allergic reaction and then call 911   2 Device   1   .  famciclovir (FAMVIR) 500 MG tablet   Oral   Take 500 mg by mouth 3 (three) times daily. Take three tablets at onset of fever blisters x 1 day         . HYDROcodone-acetaminophen (NORCO) 5-325 MG per tablet   Oral   Take 1 tablet by mouth every 6 (six) hours as needed.         Marland Kitchen HYDROcodone-acetaminophen (NORCO/VICODIN) 5-325 MG per tablet   Oral   Take 1 tablet by mouth every 6 (six) hours as needed for pain.   15 tablet   0   . hydrOXYzine (VISTARIL) 25 MG capsule      Take 1 tab po daily as needed for increased anxiety   30 capsule   1   . ibuprofen (ADVIL,MOTRIN) 800 MG tablet   Oral   Take 1 tablet (800 mg total) by mouth every 8 (eight) hours as needed for pain.   21 tablet   0   . methotrexate 1 G injection   Intravenous   Inject into the vein once.          . mometasone (NASONEX) 50 MCG/ACT nasal spray   Nasal   Place 2 sprays into the nose daily.   17 g   12   . mupirocin cream (BACTROBAN) 2 %      Apply to affected area 3 times daily   30 g   0   . omeprazole (PRILOSEC) 20 MG capsule   Oral   Take 20 mg by mouth daily.         Marland Kitchen oxymetazoline (AFRIN NASAL SPRAY) 0.05 % nasal spray   Nasal   Place 2 sprays into the nose 2 (two) times daily.   30 mL   0   . sennosides-docusate sodium (SENOKOT-S) 8.6-50 MG tablet   Oral   Take 1 tablet by mouth daily.         . ursodiol (ACTIGALL) 300 MG capsule   Oral   Take 300 mg by mouth 2 (two) times daily.         . zaleplon (SONATA) 10 MG capsule   Oral   Take 1 capsule (10 mg total) by mouth at bedtime.   30 capsule   0    BP 117/83  Pulse 88  Temp(Src) 98.5 F (36.9 C) (Oral)  Resp 20  Wt 254 lb (115.214 kg)  BMI 47.22 kg/m2  SpO2 100% Physical Exam  Nursing note and vitals reviewed. Constitutional: She is oriented to person, place, and time. She appears well-developed and well-nourished. No distress.  HENT:  Head: Normocephalic and atraumatic.  Mouth/Throat: Oropharynx is clear and moist.  Tenderness to palpation of her right frontal and maxillary sinuses. She has bilateral nasal mucosal edema.  Eyes: EOM are normal. Pupils are equal, round, and reactive to light.  Neck: Normal range of motion. Neck supple.  No nuchal rigidity  Cardiovascular: Normal rate and regular rhythm.   Pulmonary/Chest: Effort normal and breath sounds normal. No respiratory distress. She has no wheezes. She has no rales. She exhibits tenderness (chest wall tenderness is reproduced with palpation over her sternum.).  Patient has normal respiratory effort and no distress whatsoever. She has clear breath sounds throughout  Abdominal: Soft. Bowel sounds are normal. She exhibits no distension and no mass. There is no tenderness. There is no rebound and no guarding.  Musculoskeletal:  Normal range of motion. She exhibits no edema and no tenderness.  Patient has no calf tenderness  or swelling.  Neurological: She is alert and oriented to person, place, and time.  Patient is alert and oriented x3 with clear, goal oriented speech. Patient has 5/5 motor in all extremities. Sensation is intact to light touch. Patient has a normal gait and walks without assistance.   Skin: Skin is warm and dry. No rash noted. No erythema.  Psychiatric: Her behavior is normal.  Patient has flat affect.    ED Course  Procedures (including critical care time) Labs Review Labs Reviewed - No data to display Imaging Review No results found.  MDM   1. URI (upper respiratory infection)   2. Sinus infection    Given her normal vital signs and normal respiratory exam, I do not believe that imaging is necessary at this point. I believe the patient has a URI with sinus congestion based on a history of her son having similar symptoms and physical exam. She is in no respiratory distress whatsoever. I will treat her with nasal sprays and cough medication. Patient's chest pain is completely reproduced and is consistent with musculoskeletal chest pain. Patient has been seen in the past with similar symptoms. I am not concerned about cardiovascular origin for pain. Patient has been advised to follow with her primary Dr. for worsening symptoms, fevers chills or for any concerns. Return precautions have been given.    Loren Racer, MD 03/01/13 1341

## 2013-03-01 NOTE — ED Notes (Signed)
Pt. Asking for amoxicillin.  Explained that the Dr. Malvin Johns no need for the amoxicillin and over use of antibiotics.  Pt. Reports she usually gets the antibiotic she wants from Dr. Alberteen Sam.  RN explained that over use of abx. Can cause worse sickness.  Pt. Reports she understands.

## 2013-03-01 NOTE — ED Notes (Signed)
Pt in with sinus pressure, congestion, cough, shortness of breath

## 2013-03-23 ENCOUNTER — Ambulatory Visit (INDEPENDENT_AMBULATORY_CARE_PROVIDER_SITE_OTHER): Payer: Managed Care, Other (non HMO) | Admitting: Family Medicine

## 2013-03-23 ENCOUNTER — Encounter: Payer: Self-pay | Admitting: Family Medicine

## 2013-03-23 VITALS — BP 106/75 | HR 92 | Resp 16 | Ht 61.5 in | Wt 252.0 lb

## 2013-03-23 DIAGNOSIS — B373 Candidiasis of vulva and vagina: Secondary | ICD-10-CM

## 2013-03-23 DIAGNOSIS — J020 Streptococcal pharyngitis: Secondary | ICD-10-CM

## 2013-03-23 DIAGNOSIS — R05 Cough: Secondary | ICD-10-CM

## 2013-03-23 DIAGNOSIS — B3731 Acute candidiasis of vulva and vagina: Secondary | ICD-10-CM

## 2013-03-23 DIAGNOSIS — R059 Cough, unspecified: Secondary | ICD-10-CM

## 2013-03-23 LAB — POCT RAPID STREP A (OFFICE): Rapid Strep A Screen: POSITIVE — AB

## 2013-03-23 MED ORDER — FLUCONAZOLE 150 MG PO TABS: 150.0000 mg | ORAL_TABLET | Freq: Once | ORAL | Status: DC

## 2013-03-23 MED ORDER — TERCONAZOLE 0.8 % VA CREA: 1.0000 | TOPICAL_CREAM | Freq: Every day | VAGINAL | Status: DC

## 2013-03-23 MED ORDER — AMOXICILLIN 875 MG PO TABS: 875.0000 mg | ORAL_TABLET | Freq: Two times a day (BID) | ORAL | Status: DC

## 2013-03-23 NOTE — Progress Notes (Signed)
  Subjective:    Patient ID: [name] [name], female    DOB: [birth year], 47 y.o.   MRN: [id number]  HPI  [name] [name] is here today with her children because they all have been sick.  She has been feeling bad since Wednesday. She did not actually check her temperature but she thinks that she may have been running a fever last night. She has had a cough and a sore throat for several days.      Review of Systems  Constitutional: Positive for fatigue.  HENT: Positive for congestion, ear pain and sore throat.        Right ear pain  Eyes: Negative.   Respiratory: Positive for cough.   Cardiovascular: Negative.   Gastrointestinal: Negative.   Endocrine: Negative.   Genitourinary: Negative.   Musculoskeletal: Negative.   Skin: Negative.   Allergic/Immunologic: Negative.   Neurological: Negative.   Hematological: Negative.   Psychiatric/Behavioral: Negative.     Allergies  Allergen Reactions  . Flu Virus Vaccine Anaphylaxis     Past Medical History  Diagnosis Date  . Rheumatoid arthritis(714.0)   . Depression   . Morbid obesity   . HSV-1 infection         History   Social History Narrative   Marital Status: Married Probation officer)    Children:  Son [name] [name]) Daughter [name] [name])    Pets: None    Living Situation: Lives with husband and children    Occupation: Occupational psychologist Administrator)     Education: Oncologist (Psychology)     Tobacco Use/Exposure:  None    Alcohol Use:  Occasional   Drug Use:  None   Diet:  Regular   Exercise: Walking or Treadmill (2 x week)   Hobbies: Clinical cytogeneticist, Christmas Decorations.          Objective:   Physical Exam  Nursing note and vitals reviewed. Constitutional: She is oriented to person, place, and time. She appears well-developed and well-nourished.  HENT:  Oropharynx is erythematous.  Eyes: Pupils are equal, round, and reactive to light.  Neck: Normal range of motion.  Cardiovascular: Normal rate and  regular rhythm.   Pulmonary/Chest: Effort normal and breath sounds normal.  Neurological: She is alert and oriented to person, place, and time.  Skin: Skin is warm and dry.  Psychiatric: She has a normal mood and affect. Her behavior is normal. Judgment and thought content normal.      Assessment & Plan:    [name] [name] was seen today for sore throat.  Diagnoses and associated orders for this visit:  Streptococcal sore throat - POCT rapid strep A - amoxicillin (AMOXIL) 875 MG tablet; Take 1 tablet (875 mg total) by mouth 2 (two) times daily. Take for the full 10 days.  Cough -     Delsym as needed.    Candidiasis of female genitalia - fluconazole (DIFLUCAN) 150 MG tablet; Take 1 tablet (150 mg total) by mouth once. - terconazole (TERAZOL 3) 0.8 % vaginal cream; Place 1 applicator vaginally at bedtime. Use for 3 nights

## 2013-04-03 ENCOUNTER — Emergency Department (HOSPITAL_COMMUNITY): Payer: Managed Care, Other (non HMO)

## 2013-04-03 ENCOUNTER — Encounter (HOSPITAL_COMMUNITY): Payer: Self-pay | Admitting: Emergency Medicine

## 2013-04-03 ENCOUNTER — Emergency Department (HOSPITAL_COMMUNITY)
Admission: EM | Admit: 2013-04-03 | Discharge: 2013-04-04 | Disposition: A | Discharge status: Home / Self Care | Payer: Managed Care, Other (non HMO) | Attending: Emergency Medicine | Admitting: Emergency Medicine

## 2013-04-03 DIAGNOSIS — M542 Cervicalgia: Secondary | ICD-10-CM | POA: Insufficient documentation

## 2013-04-03 DIAGNOSIS — R42 Dizziness and giddiness: Secondary | ICD-10-CM | POA: Insufficient documentation

## 2013-04-03 DIAGNOSIS — Z8619 Personal history of other infectious and parasitic diseases: Secondary | ICD-10-CM | POA: Insufficient documentation

## 2013-04-03 DIAGNOSIS — F329 Major depressive disorder, single episode, unspecified: Secondary | ICD-10-CM | POA: Insufficient documentation

## 2013-04-03 DIAGNOSIS — R1031 Right lower quadrant pain: Secondary | ICD-10-CM | POA: Insufficient documentation

## 2013-04-03 DIAGNOSIS — Z3202 Encounter for pregnancy test, result negative: Secondary | ICD-10-CM | POA: Insufficient documentation

## 2013-04-03 DIAGNOSIS — R5381 Other malaise: Secondary | ICD-10-CM | POA: Insufficient documentation

## 2013-04-03 DIAGNOSIS — R109 Unspecified abdominal pain: Secondary | ICD-10-CM

## 2013-04-03 DIAGNOSIS — Z79899 Other long term (current) drug therapy: Secondary | ICD-10-CM | POA: Insufficient documentation

## 2013-04-03 DIAGNOSIS — M069 Rheumatoid arthritis, unspecified: Secondary | ICD-10-CM | POA: Insufficient documentation

## 2013-04-03 DIAGNOSIS — R079 Chest pain, unspecified: Secondary | ICD-10-CM | POA: Insufficient documentation

## 2013-04-03 DIAGNOSIS — H5710 Ocular pain, unspecified eye: Secondary | ICD-10-CM | POA: Insufficient documentation

## 2013-04-03 DIAGNOSIS — M549 Dorsalgia, unspecified: Secondary | ICD-10-CM | POA: Insufficient documentation

## 2013-04-03 DIAGNOSIS — R112 Nausea with vomiting, unspecified: Secondary | ICD-10-CM

## 2013-04-03 DIAGNOSIS — F3289 Other specified depressive episodes: Secondary | ICD-10-CM | POA: Insufficient documentation

## 2013-04-03 LAB — CBC WITH DIFFERENTIAL/PLATELET
Basophils Absolute: 0 10*3/uL (ref 0.0–0.1)
Basophils Relative: 0 % (ref 0–1)
Eosinophils Absolute: 0.2 10*3/uL (ref 0.0–0.7)
HCT: 39.2 % (ref 36.0–46.0)
Lymphocytes Relative: 48 % — ABNORMAL HIGH (ref 12–46)
MCH: 27.5 pg (ref 26.0–34.0)
MCHC: 32.4 g/dL (ref 30.0–36.0)
Monocytes Absolute: 0.8 10*3/uL (ref 0.1–1.0)
Neutro Abs: 5.2 10*3/uL (ref 1.7–7.7)
Neutrophils Relative %: 43 % (ref 43–77)
RDW: 13.8 % (ref 11.5–15.5)

## 2013-04-03 LAB — POCT PREGNANCY, URINE: Preg Test, Ur: NEGATIVE

## 2013-04-03 LAB — CG4 I-STAT (LACTIC ACID): Lactic Acid, Venous: 0.92 mmol/L (ref 0.5–2.2)

## 2013-04-03 MED ORDER — SODIUM CHLORIDE 0.9 % IV BOLUS (SEPSIS)
1000.0000 mL | Freq: Once | INTRAVENOUS | Status: AC
  Administered 2013-04-03: 1000 mL via INTRAVENOUS

## 2013-04-03 NOTE — ED Notes (Signed)
Pt c/o pain and burning at IV site; unable to flush it. PA Marissa notified and IV team paged.

## 2013-04-03 NOTE — ED Notes (Signed)
Per EMS pt coming from home with c/o pain all over. EMS reports pt received IV infusion today ([facility]) and ever since has been feeling bad; c/o chest pain, n/v/d, eyes tightness, RLQ abd.pain, tailbone pain. Per EMS pt took 324 mg aspirin prior to arrival, had 1 nitro enroute w/out relief.

## 2013-04-03 NOTE — ED Provider Notes (Signed)
CSN: [id number]     Arrival date & time 04/03/13  1917 History   First MD Initiated Contact with Patient 04/03/13 2007     Chief Complaint  Patient presents with  . multiple complaints   (Consider location/radiation/quality/duration/timing/severity/associated sxs/prior Treatment) The history is provided by the patient. No language interpreter was used.  [name] [name] is a 47 year old female with past medical history of rheumatoid arthritis, depression, morbid obesity, herpes simplex type I presenting to emergency department with multiple complaints that started today, this afternoon. Patient reported that she is currently receiving Orencia infusion for rheumatoid arthritis, reported that she received her third treatment today at approximately 10:45 AM this morning. Patient reported that she felt fine until approximately 1:00 PM. Patient reported that she had one episode of emesis-mainly stomach contents. Reported that she felt as if her eyelids were getting heavy and had to sleep. Reported that she had chest pain described as a pressure sensation to the center of her chest where she could not get comfortable, reported that this pain was constant with associated shortness of breath. Patient reports that she felt hot. Patient reports she felt weakness ongoing since 1:00 PM. Reported mild neck discomfort as reported that this is constant secondary to her rheumatoid arthritis. Patient reports she's been continuously feeling nauseous since 1:00 PM. Reported that she had right lower quadrant discomfort to the abdomen described as a sharp sensation that is worse with bowel movements, intermittent discomfort. Patient reports she's been experiencing dizziness, room spinning sensation. Reported that she's been having a headache, feels like there is a vice around her head. Denied sore throat, difficulty swallowing, neck stiffness, fever, chills, sweating, diarrhea, melena, slurred speech, hematochezia, sudden  loss of vision, blurred vision, fall, injury, head injury. PCP Dr. Alberteen Sam Rheumatologist Dr. Erie Noe  Patient reported that when she received her first treatment of Orencia infusion she felt light-headed, was able to sleep it off. Patient reported that when she received her second treatment she felt fine.  Past Medical History  Diagnosis Date  . Rheumatoid arthritis(714.0)   . Depression   . Morbid obesity   . HSV-1 infection    Past Surgical History  Procedure Laterality Date  . Cesarean section    . Tubal ligation    . Bariatric surgery     Family History  Problem Relation Age of Onset  . Hyperlipidemia Mother    History  Substance Use Topics  . Smoking status: Never Smoker   . Smokeless tobacco: Never Used  . Alcohol Use: No   OB History   Grav Para Term Preterm Abortions TAB SAB Ect Mult Living                 Review of Systems  Constitutional: Negative for fever and chills.  HENT: Negative for trouble swallowing.   Eyes: Positive for pain. Negative for visual disturbance.  Respiratory: Negative for chest tightness and shortness of breath.   Cardiovascular: Positive for chest pain.  Gastrointestinal: Positive for abdominal pain (Right lower quadrant). Negative for nausea, vomiting and diarrhea.  Genitourinary: Negative for dysuria and decreased urine volume.  Musculoskeletal: Positive for back pain. Negative for neck pain and neck stiffness.  Neurological: Positive for dizziness and weakness. Negative for numbness.  All other systems reviewed and are negative.    Allergies  Flu virus vaccine  Home Medications   Current Outpatient Rx  Name  Route  Sig  Dispense  Refill  . Abatacept (ORENCIA IV)   Intravenous  Inject 1,000 mg into the vein every 14 (fourteen) days.          Marland Kitchen aspirin 81 MG chewable tablet   Oral   Chew 324 mg by mouth once.         . benzonatate (TESSALON) 100 MG capsule   Oral   Take 1 capsule (100 mg total) by mouth every  8 (eight) hours.   21 capsule   0   . clindamycin-benzoyl peroxide (BENZACLIN) gel   Topical   Apply 1 application topically as needed (acne).          . cyclobenzaprine (FLEXERIL) 10 MG tablet   Oral   Take 10 mg by mouth 3 (three) times daily as needed for muscle spasms.         . DULoxetine (CYMBALTA) 60 MG capsule   Oral   Take 1 capsule (60 mg total) by mouth daily.   30 capsule   5   . EPINEPHrine (EPIPEN) 0.3 mg/0.3 mL DEVI      Inject into right thigh for severe allergic reaction and then call 911   2 Device   1   . famciclovir (FAMVIR) 500 MG tablet   Oral   Take 500 mg by mouth 3 (three) times daily. Take three tablets at onset of fever blisters x 1 day         . fluticasone (FLONASE) 50 MCG/ACT nasal spray   Nasal   Place 2 sprays into the nose as needed for allergies.          Marland Kitchen HYDROcodone-acetaminophen (NORCO/VICODIN) 5-325 MG per tablet   Oral   Take 1 tablet by mouth every 6 (six) hours as needed for pain.   15 tablet   0   . Methotrexate, Anti-Rheumatic, (METHOTREXATE, PF, [place])   Subcutaneous   Inject 25 mg into the skin once a week.         Marland Kitchen omeprazole (PRILOSEC) 20 MG capsule   Oral   Take 20 mg by mouth daily.         . promethazine (PHENERGAN) 25 MG tablet   Oral   Take 25 mg by mouth every 6 (six) hours as needed for nausea.          . traZODone (DESYREL) 50 MG tablet   Oral   Take 50 mg by mouth at bedtime as needed for sleep.          . ursodiol (ACTIGALL) 300 MG capsule   Oral   Take 300 mg by mouth daily.          Nolon Rod 5-1 % CREA   Topical   Apply 1 application topically as needed (fever  blisters).          . zaleplon (SONATA) 10 MG capsule   Oral   Take 1 capsule (10 mg total) by mouth at bedtime.   30 capsule   0    BP 112/85  Pulse 72  Temp(Src) 98.7 F (37.1 C) (Oral)  Resp 18  SpO2 95% Physical Exam  Nursing note and vitals reviewed. Constitutional: She is oriented to person, place,  and time. She appears well-developed and well-nourished. No distress.  HENT:  Head: Normocephalic and atraumatic.  Mouth/Throat: Oropharynx is clear and moist. No oropharyngeal exudate.  Mild tonsillar swelling identified. Negative erythema, inflammation, exudate noted to the tonsils. Negative peritonsillar abscess. Uvula midline symmetrical elevation.  Eyes: Conjunctivae and EOM are normal. Pupils are equal, round, and reactive to light. Right  eye exhibits no discharge. Left eye exhibits no discharge.  Neck: Normal range of motion. Neck supple.  Negative neck stiffness Negative nuchal rigidity Mild left-sided lymphadenopathy, tonsillar Negative meningeal signs  Cardiovascular: Normal rate, regular rhythm and normal heart sounds.  Exam reveals no friction rub.   No murmur heard. Pulmonary/Chest: Effort normal and breath sounds normal. No respiratory distress. She has no wheezes. She has no rales.  Abdominal: Soft. Bowel sounds are normal. She exhibits no distension. There is tenderness. There is no rebound and no guarding.  Mild discomfort upon palpation to suprapubic region Negative acute abdomen, negative peritoneal signs  Musculoskeletal: Normal range of motion. She exhibits no tenderness.  Neurological: She is alert and oriented to person, place, and time. She exhibits normal muscle tone. Coordination normal.  Skin: Skin is warm and dry. No rash noted. She is not diaphoretic. No erythema.  Psychiatric: She has a normal mood and affect. Her behavior is normal. Thought content normal.    ED Course  Procedures (including critical care time)  10:21 PM Spoke with attending physician regarding case and labs thus far. Physician did not recommend CT scan of abdomen to be performed at this time.  10:27 PM Nurse reported that patient is difficult to get an IV started. Reported that the blood work needed to be repeated due to the blood being hemolyzed. Reported that IV team got an IV in, but  patient reported infiltration and burning so IV had to come out. IV team called to get IV back in.   11:07 PM Patient is difficult to try and get an IV in. IV team reported that she was difficulty and will not try again. Patient upset. Nurse report this to this provider. Discussed with Dr. Alric Ran - Dr. Alric Ran to see patient.   04/04/2013 1:43 AM This provider re-checked patient. Patient reported that she is feeling much better, patient reported that she wanted to eat. Discussed labs and imaging with patient in great detail - patient understood.   Labs Review Labs Reviewed  CBC WITH DIFFERENTIAL - Abnormal; Notable for the following:    WBC 12.0 (*)    Lymphocytes Relative 48 (*)    Lymphs Abs 5.8 (*)    All other components within normal limits  URINALYSIS, ROUTINE W REFLEX MICROSCOPIC - Abnormal; Notable for the following:    Leukocytes, UA TRACE (*)    All other components within normal limits  COMPREHENSIVE METABOLIC PANEL - Abnormal; Notable for the following:    GFR calc non Af Amer 74 (*)    GFR calc Af Amer 86 (*)    All other components within normal limits  URINE MICROSCOPIC-ADD ON - Abnormal; Notable for the following:    Squamous Epithelial / LPF FEW (*)    Bacteria, UA MANY (*)    All other components within normal limits  TROPONIN I  LIPASE, BLOOD  TROPONIN I  POCT PREGNANCY, URINE  CG4 I-STAT (LACTIC ACID)  POCT I-STAT TROPONIN I   Imaging Review Dg Chest 2 View  04/03/2013   CLINICAL DATA:  Chest pain, shortness of Breath.  EXAM: CHEST - 2 VIEW  COMPARISON:  08/14/2011  FINDINGS: Borderline cardiomegaly, stable. Both lungs are clear. The visualized skeletal structures are unremarkable. No effusion.  IMPRESSION: No acute cardiopulmonary disease.   Electronically Signed   By: Oley Balm M.D.   On: 04/03/2013 21:21   Dg Lumbar Spine Complete  04/03/2013   CLINICAL DATA:  Right low back pain.  EXAM: LUMBAR SPINE - COMPLETE 4+ VIEW  COMPARISON:   09/24/2006  FINDINGS: There is no evidence of lumbar spine fracture. Alignment is normal. Intervertebral disc spaces are maintained. Chronic bilateral sacroiliitis. Facet degenerative hypertrophy right L4-5 and bilaterally L5-S1.  IMPRESSION: 1. Negative for fracture or other acute bone abnormality. 2. Lower lumbar facet degenerative change. 3. Bilateral sacroiliitis   Electronically Signed   By: Oley Balm M.D.   On: 04/03/2013 22:47    EKG Interpretation     Ventricular Rate:  76 PR Interval:  123 QRS Duration: 95 QT Interval:  380 QTC Calculation: 427 R Axis:   84 Text Interpretation:  Sinus rhythm Low voltage, precordial leads RSR' in V1 or V2, probably normal variant Borderline T abnormalities, anterior leads No significant change was found            MDM  No diagnosis found.  Patient presenting to the ED with multiple complaints that started today at approximately 1:00PM this afternoon. Patient reported that she was experiencing weakness, neck pain, abdominal pain, nausea, emesis, dizziness, sudden onset of chest pain and naproxen 1:00 with pressure sensation to the Center for chest is constant with associated shortness of breath. Alert and oriented. Lungs clear to auscultation bilaterally. Heart rate and rhythm normal. Pulses palpable and strong, radial and DP bilaterally. Bowel sounds normoactive in all 4 quadrants, mild discomfort upon palpation to suprapubic region. Negative acute abdomen, negative peritoneal signs. Full range of motion to upper and lower extremities bilaterally. EKG negative ischemic changes noted, negative new changes. Negative lactic acid elevation. CMP negative findings. Lipase negative elevation. Urine pregnancy negative. Urinalysis negative for infection-negative nitrites, trace of leukocytes identified. CBC mild elevation white blood cell of 12.0-negative leukocytosis identified. Lumbar spine negative fractures identified, lower lumbar facet  degenerative changes identified, bilateral sacroiliitis identified. Chest x-ray negative acute cardiopulmonary disease identified. Impression:, I-STAT troponin I is less than 0.03. Second set of  troponins was 0.08. Troponin to be drawn and read, repeat ordered for verification.  Fluids administered via IV. Patient's headache controlled in ED setting. Patient able to eat and tolerate food and fluids by mouth. Negative nausea or vomiting while in ED setting. Discussed case with Junius Finner, PA-C at change in shift, transfer of care to [facility] PA-C at change in shift. Discussed that if troponin level is not elevated and patient's chest pain has improved and is no longer existent patient is to be discharged with close followup. Patient to be referred to cardiology.   Raymon Mutton, PA-C 04/04/13 1803

## 2013-04-03 NOTE — ED Notes (Signed)
Lab called stating "green tube hemolyzed" and samples needs to be recollected. Phlebotomist attempted x 2 without success. IV RN called back stating she already attempted multiple times to start PIV on this pt earlier and had lots of difficulties even with an ultrasound. She suggested to have EDP start an EJ. PA notified.

## 2013-04-04 LAB — URINALYSIS, ROUTINE W REFLEX MICROSCOPIC
Bilirubin Urine: NEGATIVE
Glucose, UA: NEGATIVE mg/dL
Hgb urine dipstick: NEGATIVE
Ketones, ur: NEGATIVE mg/dL
Protein, ur: NEGATIVE mg/dL
Urobilinogen, UA: 1 mg/dL (ref 0.0–1.0)

## 2013-04-04 LAB — COMPREHENSIVE METABOLIC PANEL
ALT: 11 U/L (ref 0–35)
CO2: 24 mEq/L (ref 19–32)
Calcium: 9.5 mg/dL (ref 8.4–10.5)
Creatinine, Ser: 0.92 mg/dL (ref 0.50–1.10)
GFR calc Af Amer: 86 mL/min — ABNORMAL LOW (ref >90)
GFR calc non Af Amer: 74 mL/min — ABNORMAL LOW (ref >90)
Glucose, Bld: 83 mg/dL (ref 70–99)
Potassium: 3.9 mEq/L (ref 3.5–5.1)
Sodium: 137 mEq/L (ref 135–145)
Total Protein: 7.5 g/dL (ref 6.0–8.3)

## 2013-04-04 LAB — LIPASE, BLOOD: Lipase: 21 U/L (ref 11–59)

## 2013-04-04 LAB — POCT I-STAT TROPONIN I: Troponin i, poc: 0.08 ng/mL (ref 0.00–0.08)

## 2013-04-04 LAB — URINE MICROSCOPIC-ADD ON

## 2013-04-04 MED ORDER — SODIUM CHLORIDE 0.9 % IV BOLUS (SEPSIS)
500.0000 mL | Freq: Once | INTRAVENOUS | Status: AC
  Administered 2013-04-04: 500 mL via INTRAVENOUS

## 2013-04-04 MED ORDER — METOCLOPRAMIDE HCL 5 MG/ML IJ SOLN
10.0000 mg | Freq: Once | INTRAMUSCULAR | Status: AC
  Administered 2013-04-04: 10 mg via INTRAVENOUS
  Filled 2013-04-04: qty 2

## 2013-04-04 MED ORDER — DIPHENHYDRAMINE HCL 50 MG/ML IJ SOLN
25.0000 mg | Freq: Once | INTRAMUSCULAR | Status: AC
  Administered 2013-04-04: 25 mg via INTRAVENOUS
  Filled 2013-04-04: qty 1

## 2013-04-04 NOTE — ED Notes (Signed)
Assumed care of patient s/p report Patient resting in position of comfort with eyes closed RR WNL--even and unlabored with equal rise and fall of chest Patient in NAD Side rails up, call bell in reach and husband remains at bedside  Awaiting lab results to determine dispo

## 2013-04-04 NOTE — ED Notes (Signed)
Attempted to draw blood for istat troponin, no success, hospital phlebotomy paged

## 2013-04-04 NOTE — ED Notes (Signed)
MD at bedside. 

## 2013-04-04 NOTE — ED Provider Notes (Signed)
Pt signed out to me by Kathrynn Humble, PA-C at shift change. Plan is to recheck troponin.  If negative and pt no longer has chest pain, she is to be discharged home with PCP and rheumatology f/u.    3:28 AM Second troponin was normal.  Discussed findings with patient who states her chest pain is gone and she feels comfortable being discharged home.  Discussed recommendation to f/u with PCP, rheumatologist, and establish care with [facility] for further workup as needed.    Return precautions provided. Pt verbalized understanding and agreement with tx plan.  Junius Finner, PA-C 04/04/13 0330

## 2013-04-06 NOTE — ED Provider Notes (Signed)
Medical screening examination/treatment/procedure(s) were conducted as a shared visit with non-physician practitioner(s) and myself.  I personally evaluated the patient during the encounter.  EKG Interpretation     Ventricular Rate:  76 PR Interval:  123 QRS Duration: 95 QT Interval:  380 QTC Calculation: 427 R Axis:   84 Text Interpretation:  Sinus rhythm Low voltage, precordial leads RSR' in V1 or V2, probably normal variant Borderline T abnormalities, anterior leads No significant change was found            Angiocath insertion Performed by: Pricilla Loveless T  Consent: Verbal consent obtained. Risks and benefits: risks, benefits and alternatives were discussed Time out: Immediately prior to procedure a "time out" was called to verify the correct patient, procedure, equipment, support staff and site/side marked as required.  Preparation: Patient was prepped and draped in the usual sterile fashion.  Vein Location: right basilic  Ultrasound Guided  Gauge: 20  Normal blood return and flush without difficulty Patient tolerance: Patient tolerated the procedure well with no immediate complications.   I personally saw and evaluated patient. She has multiple complaints that are non-specific after her infusion. Feel a more acute pathology besides a medication reaction such as ACS, PE, dissection, etc are all unlikely. Will screen and if neg discharge with outpatient f/u.  Audree Camel, MD 04/06/13 1700

## 2013-04-06 NOTE — ED Provider Notes (Signed)
Medical screening examination/treatment/procedure(s) were performed by non-physician practitioner and as supervising physician I was immediately available for consultation/collaboration.    Thaxton Pelley J. Laquanta Hummel, MD 04/06/13 1527 

## 2013-04-17 DIAGNOSIS — J069 Acute upper respiratory infection, unspecified: Secondary | ICD-10-CM | POA: Insufficient documentation

## 2013-04-17 DIAGNOSIS — R070 Pain in throat: Secondary | ICD-10-CM | POA: Insufficient documentation

## 2013-04-20 ENCOUNTER — Encounter (INDEPENDENT_AMBULATORY_CARE_PROVIDER_SITE_OTHER): Payer: Self-pay

## 2013-04-20 ENCOUNTER — Ambulatory Visit (INDEPENDENT_AMBULATORY_CARE_PROVIDER_SITE_OTHER): Payer: Managed Care, Other (non HMO) | Admitting: Family Medicine

## 2013-04-20 ENCOUNTER — Encounter: Payer: Self-pay | Admitting: Family Medicine

## 2013-04-20 VITALS — BP 104/67 | HR 82 | Resp 16 | Ht 61.5 in | Wt 248.0 lb

## 2013-04-20 DIAGNOSIS — E785 Hyperlipidemia, unspecified: Secondary | ICD-10-CM

## 2013-04-20 DIAGNOSIS — J3489 Other specified disorders of nose and nasal sinuses: Secondary | ICD-10-CM

## 2013-04-20 NOTE — Progress Notes (Signed)
  Subjective:    Patient ID: [name] [name], female    DOB: 09/20/1965, 47 y.o.   MRN: [id number]  HPI  [name] [name] is here today for a couple of issues.  She is having some head congestion and she needs to get an insurance form completed.    Review of Systems  Constitutional: Positive for activity change.  HENT: Positive for ear pain and sinus pressure.   Eyes: Negative.   Respiratory: Negative for chest tightness and shortness of breath.   Cardiovascular: Negative.   Gastrointestinal: Negative.   Endocrine: Negative.   Musculoskeletal: Negative.   Skin: Negative.   Allergic/Immunologic: Negative.   Neurological: Positive for headaches.  Hematological: Negative.   Psychiatric/Behavioral: Negative.      Past Medical History  Diagnosis Date  . Rheumatoid arthritis(714.0)   . Depression   . Morbid obesity   . HSV-1 infection      Family History  Problem Relation Age of Onset  . Hyperlipidemia Mother      History   Social History Narrative   Marital Status: Married Probation officer)    Children:  Son [name] [name]) Daughter [name] [name])    Pets: None    Living Situation: Lives with husband and children    Occupation: Occupational psychologist Administrator)     Education: Oncologist (Psychology)     Tobacco Use/Exposure:  None    Alcohol Use:  Occasional   Drug Use:  None   Diet:  Regular   Exercise: Walking or Treadmill (2 x week)   Hobbies: Clinical cytogeneticist, [employer].                    Objective:   Physical Exam  Constitutional: She is oriented to person, place, and time. She appears well-developed and well-nourished.  HENT:  Head: Normocephalic.  Eyes: Conjunctivae are normal. Pupils are equal, round, and reactive to light.  Neck: Normal range of motion.  Cardiovascular: Normal rate and regular rhythm.   Pulmonary/Chest: Effort normal and breath sounds normal.  Abdominal: Soft.  Musculoskeletal: Normal range of motion.  Neurological: She is  alert and oriented to person, place, and time.  Skin: Skin is warm and dry.  Psychiatric: She has a normal mood and affect. Her behavior is normal. Judgment and thought content normal.      Assessment & Plan:    Mayukha was seen today for head congestion.  Diagnoses and associated orders for this visit:  Sinus pressure Comments: She is to take some Mucinex D and use a sinus rinse.    Other and unspecified hyperlipidemia - Lipid panel

## 2013-05-02 ENCOUNTER — Other Ambulatory Visit: Payer: Self-pay | Admitting: Family Medicine

## 2013-05-07 ENCOUNTER — Other Ambulatory Visit: Payer: Self-pay

## 2013-05-07 ENCOUNTER — Ambulatory Visit
Admission: RE | Admit: 2013-05-07 | Discharge: 2013-05-07 | Disposition: A | Discharge status: Home / Self Care | Payer: Managed Care, Other (non HMO) | Source: Ambulatory Visit

## 2013-05-07 DIAGNOSIS — Z1231 Encounter for screening mammogram for malignant neoplasm of breast: Secondary | ICD-10-CM

## 2013-05-09 ENCOUNTER — Ambulatory Visit (INDEPENDENT_AMBULATORY_CARE_PROVIDER_SITE_OTHER): Payer: Managed Care, Other (non HMO) | Admitting: Cardiology

## 2013-05-09 ENCOUNTER — Encounter: Payer: Self-pay | Admitting: Cardiology

## 2013-05-09 ENCOUNTER — Encounter (INDEPENDENT_AMBULATORY_CARE_PROVIDER_SITE_OTHER): Payer: Self-pay

## 2013-05-09 ENCOUNTER — Encounter: Payer: Self-pay | Admitting: General Surgery

## 2013-05-09 VITALS — BP 115/77 | HR 91 | Ht 61.0 in | Wt 249.8 lb

## 2013-05-09 DIAGNOSIS — R0602 Shortness of breath: Secondary | ICD-10-CM | POA: Insufficient documentation

## 2013-05-09 DIAGNOSIS — R079 Chest pain, unspecified: Secondary | ICD-10-CM

## 2013-05-09 NOTE — Progress Notes (Signed)
[address], [address] [place], [place]  [address] Phone: [phone] Fax:  [phone]  Date:  05/09/2013   ID:  [name] [name], DOB 25-Mar-1966, MRN [id number]  PCP:  Birdena Jubilee, MD  Cardiologist:  [facility]     History of Present Illness: [name] [name] is a 47 year old female with past medical history of rheumatoid arthritis, depression, morbid obesity, herpes simplex type I presented to emergency department In October with multiple complaints Patient reported that she was receiving Orencia infusion for rheumatoid arthritis, reported that she received her third treatment. Patient reported that she felt fine until approximately 1:00 PM while folding clothes. Patient reported that she had one episode of emesis-mainly stomach contents. Reported that she felt as if her eyelids were getting heavy and had to sleep. Reported that she had chest pain described as a pressure sensation to the center of her chest where she could not get comfortable, reported that this pain was constant with associated shortness of breath  The pain was described as a tightness around her chest which came on after the vomiting.  SHe was SOB as well.   Patient reports that she felt hot. Patient reports she felt weakness. Reported mild neck discomfort as reported that this was constant secondary to her rheumatoid arthritis. Patient reported she's been continuously feeling nauseous since 1:00 PM. Also reported that she had right lower quadrant discomfort to the abdomen described as a sharp sensation that is worse with bowel movements, intermittent discomfort. She had an evaluation in the ER where Troponins were normal and EKG was normal and was sent home. She said by the next day the pain had subsided. She is now referred by her PCP for further evaluation.  Since the ER visit she has not had any further chest pain but she says that when she exerts herself she will break out in a dripping sweat.  She occasionally has DOE.   She denies any LE edema but occasionally can feel her heart beating fast and irrgular.  She has had some IV access problems and she needs to be cleared to get a port-a-cath placement.  Wt Readings from Last 3 Encounters:  05/09/13 249 lb 12.8 oz (113.309 kg)  04/20/13 248 lb (112.492 kg)  03/23/13 252 lb (114.306 kg)     Past Medical History  Diagnosis Date  . Rheumatoid arthritis(714.0)   . Depression   . Morbid obesity   . HSV-1 infection     Current Outpatient Prescriptions  Medication Sig Dispense Refill  . Abatacept (ORENCIA IV) Inject 1,000 mg into the vein every 14 (fourteen) days.       . benzonatate (TESSALON) 100 MG capsule Take 100 mg by mouth 3 (three) times daily as needed.      . clindamycin-benzoyl peroxide (BENZACLIN) gel Apply 1 application topically as needed (acne).       . cyclobenzaprine (FLEXERIL) 10 MG tablet Take 10 mg by mouth 3 (three) times daily as needed for muscle spasms.      . DULoxetine (CYMBALTA) 60 MG capsule Take 1 capsule (60 mg total) by mouth daily.  30 capsule  5  . EPINEPHrine (EPIPEN) 0.3 mg/0.3 mL DEVI Inject into right thigh for severe allergic reaction and then call 911  2 Device  1  . famciclovir (FAMVIR) 500 MG tablet Take 500 mg by mouth 3 (three) times daily. Take three tablets at onset of fever blisters x 1 day      . fluticasone (FLONASE) 50  MCG/ACT nasal spray Place 2 sprays into the nose as needed for allergies.       Marland Kitchen HYDROcodone-acetaminophen (NORCO/VICODIN) 5-325 MG per tablet Take 1 tablet by mouth every 6 (six) hours as needed for pain.  15 tablet  0  . Methotrexate, Anti-Rheumatic, (METHOTREXATE, PF, [place]) Inject 25 mg into the skin once a week.      Marland Kitchen omeprazole (PRILOSEC) 20 MG capsule Take 20 mg by mouth daily.      . promethazine (PHENERGAN) 25 MG tablet Take 25 mg by mouth every 6 (six) hours as needed for nausea.       . traZODone (DESYREL) 50 MG tablet Take 50 mg by mouth at bedtime as needed for sleep.       . ursodiol  (ACTIGALL) 300 MG capsule Take 300 mg by mouth daily.       Nolon Rod 5-1 % CREA APPLY TO FEVER BLISTER 5 TIMES PER DAY  5 g  0  . zaleplon (SONATA) 10 MG capsule Take 1 capsule (10 mg total) by mouth at bedtime.  30 capsule  0   No current facility-administered medications for this visit.    Allergies:    Allergies  Allergen Reactions  . Flu Virus Vaccine Anaphylaxis    Social History:  The patient  reports that she has never smoked. She has never used smokeless tobacco. She reports that she does not drink alcohol or use illicit drugs.   Family History:  The patient's family history includes Hyperlipidemia in her mother.   ROS:  Please see the history of present illness.      All other systems reviewed and negative.   PHYSICAL EXAM: VS:  BP 115/77  Pulse 91  Ht 5\' 1"  (1.549 m)  Wt 249 lb 12.8 oz (113.309 kg)  BMI 47.22 kg/m2 Well nourished, well developed, in no acute distress HEENT: normal Neck: no JVD Cardiac:  normal S1, S2; RRR; no murmur Lungs:  clear to auscultation bilaterally, no wheezing, rhonchi or rales Abd: soft, nontender, no hepatomegaly Ext: no edema Skin: warm and dry Neuro:  CNs 2-12 intact, no focal abnormalities noted  EKG:    NSR with IRBBB  ASSESSMENT AND PLAN:  1. Chest pain somewhat atypical in nature and seemed to come on after vomiting.  She has some DOE but has not had any further CP.  Her EKG and cardiac markers were normal except for incomplete RBBB.  CRF include obesity. 2. DOE most likely related to underlying obesity but need to rule out ischemia since she needs to undergo general anesthesia for portacath insertion.  - I will get an echo to evaluate VF  - Lexiscan myoview to rule out ischemia  - If cardiac workup ok then can proceed with surgery  Signed, Armanda Magic, MD 05/09/2013 3:03 PM

## 2013-05-09 NOTE — Patient Instructions (Addendum)
Your physician recommends that you continue on your current medications as directed. Please refer to the Current Medication list given to you today.  Your physician has requested that you have an echocardiogram. Echocardiography is a painless test that uses sound waves to create images of your heart. It provides your doctor with information about the size and shape of your heart and how well your heart's chambers and valves are working. This procedure takes approximately one hour. There are no restrictions for this procedure.  Your physician has requested that you have a lexiscan myoview. For further information please visit [email or web address] Please follow instruction sheet, as given.  You have been referred to [facility]. They have you set up to see Dr. Andrey Campanile at 4:30 on Thursday 05/10/13. Please arrive 30 minutes before your appointment with your photo id and insurance card.

## 2013-05-10 ENCOUNTER — Ambulatory Visit (HOSPITAL_COMMUNITY): Payer: Managed Care, Other (non HMO) | Attending: Cardiology | Admitting: Radiology

## 2013-05-10 ENCOUNTER — Encounter (INDEPENDENT_AMBULATORY_CARE_PROVIDER_SITE_OTHER): Payer: Self-pay | Admitting: General Surgery

## 2013-05-10 ENCOUNTER — Ambulatory Visit (INDEPENDENT_AMBULATORY_CARE_PROVIDER_SITE_OTHER): Payer: Managed Care, Other (non HMO) | Admitting: General Surgery

## 2013-05-10 VITALS — BP 118/74 | HR 72 | Temp 98.0°F | Resp 14 | Ht 61.5 in | Wt 247.2 lb

## 2013-05-10 DIAGNOSIS — I079 Rheumatic tricuspid valve disease, unspecified: Secondary | ICD-10-CM | POA: Insufficient documentation

## 2013-05-10 DIAGNOSIS — I998 Other disorder of circulatory system: Secondary | ICD-10-CM

## 2013-05-10 DIAGNOSIS — R072 Precordial pain: Secondary | ICD-10-CM

## 2013-05-10 DIAGNOSIS — R079 Chest pain, unspecified: Secondary | ICD-10-CM | POA: Insufficient documentation

## 2013-05-10 DIAGNOSIS — I878 Other specified disorders of veins: Secondary | ICD-10-CM

## 2013-05-10 DIAGNOSIS — R109 Unspecified abdominal pain: Secondary | ICD-10-CM | POA: Insufficient documentation

## 2013-05-10 DIAGNOSIS — R0989 Other specified symptoms and signs involving the circulatory and respiratory systems: Secondary | ICD-10-CM | POA: Insufficient documentation

## 2013-05-10 DIAGNOSIS — Z6841 Body Mass Index (BMI) 40.0 and over, adult: Secondary | ICD-10-CM | POA: Insufficient documentation

## 2013-05-10 DIAGNOSIS — R799 Abnormal finding of blood chemistry, unspecified: Secondary | ICD-10-CM | POA: Insufficient documentation

## 2013-05-10 DIAGNOSIS — R0609 Other forms of dyspnea: Secondary | ICD-10-CM | POA: Insufficient documentation

## 2013-05-10 DIAGNOSIS — R0602 Shortness of breath: Secondary | ICD-10-CM | POA: Insufficient documentation

## 2013-05-10 DIAGNOSIS — M549 Dorsalgia, unspecified: Secondary | ICD-10-CM | POA: Insufficient documentation

## 2013-05-10 NOTE — Progress Notes (Signed)
Patient ID: [name] [name], female   DOB: [birth year], 47 y.o.   MRN: [id number]  Chief Complaint  Patient presents with  . New Evaluation    eval for PAC for injections    HPI [name] [name] is a 47 y.o. female.   HPI 47 year old morbidly obese African American female referred by Dr. Corliss Skains for evaluation for potential Port-A-Cath insertion. The patient has a history of rheumatoid arthritis and is on multimodal therapy. She has been on methotrexate, Enbrel, Humira. She is currently on prednisone, methotrexate, and IV infusion therapy called Orencia. She reports ongoing worsening attempts by medical professionals to obtain peripheral IV access. She ended up in the emergency room a few weeks ago for atypical chest pain and it took 11 attempts to establish a peripheral IV. She is here today to discuss the possibility of Port-A-Cath insertion due to poor venous access. She denies any prior central lines. She denies any history of blood clotting or family of blood clots. She is in the process of getting evaluated by the cardiologist because of this chest pain. She had a laparoscopic sleeve gastrectomy in June of this year at [facility]. So far she states that she has lost about 70 pounds. She denies any abdominal pain, reflux or indigestion or difficulty eating or drinking.  Past Medical History  Diagnosis Date  . Rheumatoid arthritis(714.0)   . Depression   . Morbid obesity   . HSV-1 infection     Past Surgical History  Procedure Laterality Date  . Cesarean section    . Tubal ligation    . Laparoscopic gastric sleeve resection  11/21/12    [facility]    Family History  Problem Relation Age of Onset  . Hyperlipidemia Mother     Social History History  Substance Use Topics  . Smoking status: Never Smoker   . Smokeless tobacco: Never Used  . Alcohol Use: No    Allergies  Allergen Reactions  . Flu Virus Vaccine Anaphylaxis    Current Outpatient Prescriptions   Medication Sig Dispense Refill  . Abatacept (ORENCIA IV) Inject 1,000 mg into the vein every 14 (fourteen) days.       . Acyclovir-Hydrocortisone (XERESE) 5-1 % CREA APPLY TO FEVER BLISTER 5 TIMES PER DAY AS NEEDED      . benzonatate (TESSALON) 100 MG capsule Take 100 mg by mouth 3 (three) times daily as needed.      . clindamycin-benzoyl peroxide (BENZACLIN) gel Apply 1 application topically as needed (acne).       . cyclobenzaprine (FLEXERIL) 10 MG tablet Take 10 mg by mouth 3 (three) times daily as needed for muscle spasms.      . DULoxetine (CYMBALTA) 60 MG capsule Take 1 capsule (60 mg total) by mouth daily.  30 capsule  5  . EPINEPHrine (EPIPEN) 0.3 mg/0.3 mL DEVI Inject into right thigh for severe allergic reaction and then call 911  2 Device  1  . famciclovir (FAMVIR) 500 MG tablet Take 500 mg by mouth 3 (three) times daily as needed. Take three tablets at onset of fever blisters x 1 day      . fluticasone (FLONASE) 50 MCG/ACT nasal spray Place 2 sprays into the nose as needed for allergies.       Marland Kitchen HYDROcodone-acetaminophen (NORCO/VICODIN) 5-325 MG per tablet Take 1 tablet by mouth every 6 (six) hours as needed for pain.  15 tablet  0  . Methotrexate, Anti-Rheumatic, (METHOTREXATE, PF, [place]) Inject 25 mg into the  skin once a week.      Marland Kitchen omeprazole (PRILOSEC) 20 MG capsule Take 20 mg by mouth daily.      . promethazine (PHENERGAN) 25 MG tablet Take 25 mg by mouth every 6 (six) hours as needed for nausea.       . ursodiol (ACTIGALL) 300 MG capsule Take 300 mg by mouth daily.       . VOLTAREN 1 % GEL        No current facility-administered medications for this visit.    Review of Systems Review of Systems  Constitutional: Negative for fever, activity change, appetite change and unexpected weight change.  HENT: Negative for nosebleeds and trouble swallowing.   Eyes: Negative for photophobia and visual disturbance.  Respiratory: Negative for chest tightness and shortness of breath.    Cardiovascular: Positive for chest pain. Negative for leg swelling.       Denies CP, SOB, PND, DOE. ?orthopnea  Gastrointestinal: Negative for nausea, vomiting and abdominal pain.  Genitourinary: Negative for dysuria and difficulty urinating.  Musculoskeletal: Negative for arthralgias.       +RA on multiple therapies  Skin: Negative for pallor and rash.  Neurological: Negative for dizziness, seizures, facial asymmetry and numbness.       Denies TIA and amaurosis fugax   Hematological: Negative for adenopathy. Does not bruise/bleed easily.  Psychiatric/Behavioral: Negative for behavioral problems and agitation.    Blood pressure 118/74, pulse 72, temperature 98 F (36.7 C), temperature source Temporal, resp. rate 14, height 5' 1.5" (1.562 m), weight 247 lb 3.2 oz (112.129 kg).  Physical Exam Physical Exam  Vitals reviewed. Constitutional: She is oriented to person, place, and time. She appears well-developed and well-nourished. No distress.  Morbid obesity   HENT:  Head: Normocephalic and atraumatic.  Right Ear: External ear normal.  Left Ear: External ear normal.  Eyes: Conjunctivae are normal. No scleral icterus.  Neck: Normal range of motion. Neck supple. No JVD present. No tracheal deviation present. No thyromegaly present.  Cardiovascular: Normal rate and normal heart sounds.   Pulmonary/Chest: Effort normal and breath sounds normal. No stridor. No respiratory distress. She has no wheezes.  Abdominal: Soft. She exhibits no distension. There is no tenderness. There is no rebound.  Well healed trocar sites  Lymphadenopathy:    She has no cervical adenopathy.  Neurological: She is alert and oriented to person, place, and time. She exhibits normal muscle tone.  Skin: Skin is warm and dry. No rash noted. She is not diaphoretic. No erythema.  Psychiatric: She has a normal mood and affect. Her behavior is normal. Judgment and thought content normal.    Data Reviewed I reviewed  the ER physician notes as well as Dr. Norris Cross notes  Assessment    RA Poor venous access Morbid obesity s/p Lap sleeve gastrectomy     Plan    I congratulated her on her weight loss. We then turned the discussion to Port-A-Cath insertion. She was shown a Port-A-Cath. We discussed the risk and benefits of surgery including but not limited to bleeding, infection, injury to surrounding vessels, lung collapse, anesthesia risk, blood clot formation, need to reposition the catheter, port complications and infection. I discussed the general steps of surgery as well as the immediate postoperative care. All of her questions were asked and answered. She was given Agricultural engineer. The current plan is for her to go next week for another IV infusion. If they do not have difficulty with establishing a peripheral IV she states  the plan is to hold off on Port-A-Cath insertion. If they do have difficulty with peripheral IV access then she would like to proceed with Port-A-Cath insertion. I advised and asked the patient to contact our office With how she would like to proceed. I advised that if she does elect to proceed with Port-A-Cath insertion then we would need to obtain cardiac clearance prior to surgery  Mary Sella. Andrey Campanile, MD, FACS General, Bariatric, & Minimally Invasive Surgery [facility], [place]        [facility] M 05/10/2013, 5:31 PM

## 2013-05-10 NOTE — Patient Instructions (Signed)
Please call if you need to proceed with insertion of a port a cath for IV access  Implanted Port Instructions An implanted port is a central line that has a round shape and is placed under the skin. It is used for long-term IV (intravenous) access for:  Medicine.  Fluids.  Liquid nutrition, such as TPN (total parenteral nutrition).  Blood samples. Ports can be placed:  In the chest area just below the collarbone (this is the most common place.)  In the arms.  In the belly (abdomen) area.  In the legs. PARTS OF THE PORT A port has 2 main parts:  The reservoir. The reservoir is round, disc-shaped, and will be a small, raised area under your skin.  The reservoir is the part where a needle is inserted (accessed) to either give medicines or to draw blood.  The catheter. The catheter is a long, slender tube that extends from the reservoir. The catheter is placed into a large vein.  Medicine that is inserted into the reservoir goes into the catheter and then into the vein. INSERTION OF THE PORT  The port is surgically placed in either an operating room or in a procedural area (interventional radiology).  Medicine may be given to help you relax during the procedure.  The skin where the port will be inserted is numbed (local anesthetic).  1 or 2 small cuts (incisions) will be made in the skin to insert the port.  The port can be used after it has been inserted. INCISION SITE CARE  The incision site may have small adhesive strips on it. This helps keep the incision site closed. Sometimes, no adhesive strips are placed. Instead of adhesive strips, a special kind of surgical glue is used to keep the incision closed.  If adhesive strips were placed on the incision sites, do not take them off. They will fall off on their own.  The incision site may be sore for 1 to 2 days. Pain medicine can help.  Do not get the incision site wet. Bathe or shower as directed by your  caregiver.  The incision site should heal in 5 to 7 days. A small scar may form after the incision has healed. ACCESSING THE PORT Special steps must be taken to access the port:  Before the port is accessed, a numbing cream can be placed on the skin. This helps numb the skin over the port site.  A sterile technique is used to access the port.  The port is accessed with a needle. Only "non-coring" port needles should be used to access the port. Once the port is accessed, a blood return should be checked. This helps ensure the port is in the vein and is not clogged (clotted).  If your caregiver believes your port should remain accessed, a clear (transparent) bandage will be placed over the needle site. The bandage and needle will need to be changed every week or as directed by your caregiver.  Keep the bandage covering the needle clean and dry. Do not get it wet. Follow your caregiver's instructions on how to take a shower or bath when the port is accessed.  If your port does not need to stay accessed, no bandage is needed over the port. FLUSHING THE PORT Flushing the port keeps it from getting clogged. How often the port is flushed depends on:  If a constant infusion is running. If a constant infusion is running, the port may not need to be flushed.  If intermittent  medicines are given.  If the port is not being used. For intermittent medicines:  The port will need to be flushed:  After medicines have been given.  After blood has been drawn.  As part of routine maintenance.  A port is normally flushed with:  Normal saline.  Heparin.  Follow your caregiver's advice on how often, how much, and the type of flush to use on your port. IMPORTANT PORT INFORMATION  Tell your caregiver if you are allergic to heparin.  After your port is placed, you will get a manufacturer's information card. The card has information about your port. Keep this card with you at all times.  There  are many types of ports available. Know what kind of port you have.  In case of an emergency, it may be helpful to wear a medical alert bracelet. This can help alert health care workers that you have a port.  The port can stay in for as long as your caregiver believes it is necessary.  When it is time for the port to come out, surgery will be done to remove it. The surgery will be similar to how the port was put in.  If you are in the hospital or clinic:  Your port will be taken care of and flushed by a nurse.  If you are at home:  A home health care nurse may give medicines and take care of the port.  You or a family member can get special training and directions for giving medicine and taking care of the port at home. SEEK IMMEDIATE MEDICAL CARE IF:   Your port does not flush or you are unable to get a blood return.  New drainage or pus is coming from the incision.  A bad smell is coming from the incision site.  You develop swelling or increased redness at the incision site.  You develop increased swelling or pain at the port site.  You develop swelling or pain in the surrounding skin near the port.  You have an oral temperature above 102 F (38.9 C), not controlled by medicine. MAKE SURE YOU:   Understand these instructions.  Will watch your condition.  Will get help right away if you are not doing well or get worse. Document Released: 05/24/2005 Document Revised: 08/16/2011 Document Reviewed: 08/15/2008 [facility] Patient Information 2014 [place], [place].

## 2013-05-10 NOTE — Progress Notes (Signed)
Echocardiogram performed.  

## 2013-05-15 ENCOUNTER — Other Ambulatory Visit (HOSPITAL_COMMUNITY): Payer: Self-pay

## 2013-05-15 ENCOUNTER — Encounter: Payer: Self-pay | Admitting: Cardiology

## 2013-05-15 ENCOUNTER — Encounter (HOSPITAL_COMMUNITY): Payer: Managed Care, Other (non HMO)

## 2013-05-15 ENCOUNTER — Ambulatory Visit (HOSPITAL_COMMUNITY): Payer: Managed Care, Other (non HMO) | Attending: Cardiology | Admitting: Radiology

## 2013-05-15 VITALS — BP 129/77 | HR 73 | Ht 62.0 in | Wt 243.0 lb

## 2013-05-15 DIAGNOSIS — R110 Nausea: Secondary | ICD-10-CM | POA: Insufficient documentation

## 2013-05-15 DIAGNOSIS — R0602 Shortness of breath: Secondary | ICD-10-CM | POA: Insufficient documentation

## 2013-05-15 DIAGNOSIS — I4510 Unspecified right bundle-branch block: Secondary | ICD-10-CM | POA: Insufficient documentation

## 2013-05-15 DIAGNOSIS — R61 Generalized hyperhidrosis: Secondary | ICD-10-CM | POA: Insufficient documentation

## 2013-05-15 DIAGNOSIS — R5381 Other malaise: Secondary | ICD-10-CM | POA: Insufficient documentation

## 2013-05-15 DIAGNOSIS — R079 Chest pain, unspecified: Secondary | ICD-10-CM | POA: Insufficient documentation

## 2013-05-15 MED ORDER — TECHNETIUM TC 99M SESTAMIBI GENERIC - CARDIOLITE
33.0000 | Freq: Once | INTRAVENOUS | Status: AC | PRN
  Administered 2013-05-15: 33 via INTRAVENOUS

## 2013-05-15 MED ORDER — REGADENOSON 0.4 MG/5ML IV SOLN
0.4000 mg | Freq: Once | INTRAVENOUS | Status: AC
  Administered 2013-05-15: 0.4 mg via INTRAVENOUS

## 2013-05-15 NOTE — Progress Notes (Signed)
[facility] [address] NUCLEAR MED [address] [place], [place] [address] [phone]    Cardiology Nuclear Med Study  [name] [name] is a 47 y.o. female     MRN : [id number]     DOB: April 15, 1966  Procedure Date: 05/15/2013  Nuclear Med Background Indication for Stress Test:  Evaluation for Ischemia History:  No known CAD, Echo 05/2013 EF 50-50% Cardiac Risk Factors: IRBBB  Symptoms:  Chest Pain, Diaphoresis, Fatigue, Nausea and SOB   Nuclear Pre-Procedure Caffeine/Decaff Intake:  None > 12 hrs NPO After: 8:00am   Lungs:  clear O2 Sat: 989% on room air. IV 0.9% NS with Angio Cath:  22g  IV Site: R Antecubital x 1, tolerated well IV Started by:  Irean Hong, RN  Chest Size (in):  44 Cup Size: DDD  Height: 5\' 2"  (1.575 m)  Weight:  243 lb (110.224 kg)  BMI:  Body mass index is 44.43 kg/(m^2). Tech Comments:  N/A    Nuclear Med Study 1 or 2 day study: 2 day  Stress Test Type:  Lexiscan  Reading MD: Armanda Magic, MD  Order Authorizing Provider:  Armanda Magic, MD  Resting Radionuclide: Technetium 58m Sestamibi  Resting Radionuclide Dose:33.0  mCi  05/17/13  Stress Radionuclide:  Technetium 77m Sestamibi  Stress Radionuclide Dose: 33.0 mCi  05/15/13          Stress Protocol Rest HR: 73 Stress HR: 96  Rest BP: 129/77 Stress BP: 146/67  Exercise Time (min): n/a METS: n/a           Dose of Adenosine (mg):  n/a Dose of Lexiscan: 0.4 mg  Dose of Atropine (mg): n/a Dose of Dobutamine: n/a mcg/kg/min (at max HR)  Stress Test Technologist: Nelson Chimes, BS-ES  Nuclear Technologist:  Doyne Keel, CNMT     Rest Procedure:  Myocardial perfusion imaging was performed at rest 45 minutes following the intravenous administration of Technetium 61m Sestamibi. Rest ECG: NSR - Normal EKG  Stress Procedure:  The patient received IV Lexiscan 0.4 mg over 15-seconds.  Technetium 23m Sestamibi injected at 30-seconds.  Quantitative spect images were obtained after a 45  minute delay. Stress ECG: No significant change from baseline ECG  QPS Raw Data Images:  Normal; no motion artifact; normal heart/lung ratio. Stress Images:  There is decreased uptake in the apex. Rest Images:  There is decreased uptake in the apex. Subtraction (SDS):  There is a fixed anteriour defect that is most consistent with breast attenuation. Transient Ischemic Dilatation (Normal <1.22):  0.84 Lung/Heart Ratio (Normal <0.45):  0.22  Quantitative Gated Spect Images QGS EDV:  77 ml QGS ESV:  29 ml  Impression Exercise Capacity:  Lexiscan with no exercise. BP Response:  Normal blood pressure response. Clinical Symptoms:  No significant symptoms noted. ECG Impression:  No significant ST segment change suggestive of ischemia. Comparison with Prior Nuclear Study: No images to compare  Overall Impression:  Low risk stress nuclear study fixed defect in the apex consistent with breast attenuation artificat.  No ischemia..  LV Ejection Fraction: 73%.  LV Wall Motion:  NL LV Function; NL Wall Motion   Signed: Armanda Magic, MD 05/18/2013

## 2013-05-16 ENCOUNTER — Encounter (HOSPITAL_COMMUNITY)
Admission: RE | Admit: 2013-05-16 | Discharge: 2013-05-16 | Disposition: A | Discharge status: Home / Self Care | Payer: Managed Care, Other (non HMO) | Source: Ambulatory Visit | Attending: Rheumatology | Admitting: Rheumatology

## 2013-05-16 DIAGNOSIS — M069 Rheumatoid arthritis, unspecified: Secondary | ICD-10-CM | POA: Insufficient documentation

## 2013-05-16 MED ORDER — SODIUM CHLORIDE 0.9 % IV SOLN
INTRAVENOUS | Status: DC
  Administered 2013-05-16: 10:00:00 via INTRAVENOUS

## 2013-05-16 MED ORDER — SODIUM CHLORIDE 0.9 % IV SOLN
1000.0000 mg | INTRAVENOUS | Status: DC
  Administered 2013-05-16: 11:00:00 1000 mg via INTRAVENOUS
  Filled 2013-05-16: qty 40

## 2013-05-17 ENCOUNTER — Ambulatory Visit (HOSPITAL_COMMUNITY): Payer: Managed Care, Other (non HMO) | Attending: Cardiovascular Disease

## 2013-05-17 DIAGNOSIS — R0989 Other specified symptoms and signs involving the circulatory and respiratory systems: Secondary | ICD-10-CM

## 2013-05-17 MED ORDER — TECHNETIUM TC 99M SESTAMIBI GENERIC - CARDIOLITE
33.0000 | Freq: Once | INTRAVENOUS | Status: AC | PRN
  Administered 2013-05-17: 33 via INTRAVENOUS

## 2013-05-18 ENCOUNTER — Encounter: Payer: Self-pay | Admitting: Cardiology

## 2013-05-18 ENCOUNTER — Ambulatory Visit (INDEPENDENT_AMBULATORY_CARE_PROVIDER_SITE_OTHER): Payer: Managed Care, Other (non HMO) | Admitting: Cardiology

## 2013-05-18 VITALS — BP 127/90 | HR 89 | Ht 61.5 in | Wt 245.0 lb

## 2013-05-18 DIAGNOSIS — I519 Heart disease, unspecified: Secondary | ICD-10-CM

## 2013-05-18 DIAGNOSIS — I5189 Other ill-defined heart diseases: Secondary | ICD-10-CM

## 2013-05-18 DIAGNOSIS — R0602 Shortness of breath: Secondary | ICD-10-CM

## 2013-05-18 MED ORDER — DILTIAZEM HCL ER COATED BEADS 180 MG PO CP24: 180.0000 mg | ORAL_CAPSULE | Freq: Every day | ORAL | Status: DC

## 2013-05-18 NOTE — Progress Notes (Signed)
[address] [place], [place]  [address] Phone: [phone] Fax:  [phone]  Date:  05/18/2013   ID:  [name] [name], DOB June 07, 1966, MRN [id number]  PCP:  [name] Jubilee, MD  Cardiologist:  [name] Magic, MD     History of Present Illness:[name] [name] is a 47 year old female with past medical history of rheumatoid arthritis, depression, morbid obesity, herpes simplex type I presented to emergency department In October with multiple complaints Patient reported that she was receiving Orencia infusion for rheumatoid arthritis, reported that she received her third treatment. Patient reported that she felt fine until approximately 1:00 PM while folding clothes. Patient reported that she had one episode of emesis-mainly stomach contents. Reported that she felt as if her eyelids were getting heavy and had to sleep. Reported that she had chest pain described as a pressure sensation to the center of her chest where she could not get comfortable, reported that this pain was constant with associated shortness of breath The pain was described as a tightness around her chest which came on after the vomiting. SHe was SOB as well. Patient reports that she felt hot. Patient reports she felt weakness. Reported mild neck discomfort as reported that this was constant secondary to her rheumatoid arthritis. Patient reported she's been continuously feeling nauseous since 1:00 PM. Also reported that she had right lower quadrant discomfort to the abdomen described as a sharp sensation that is worse with bowel movements, intermittent discomfort. She had an evaluation in the ER where Troponins were normal and EKG was normal and was sent home. She said by the next day the pain had subsided.Since the ER visit she has not had any further chest pain but she says that when she exerts herself she will break out in a dripping sweat. She occasionally has DOE. She denies any LE edema but occasionally can feel  her heart beating fast and irrgular. She has had some IV access problems and she needs to be cleared to get a port-a-cath placement. She underwent recent nuclear stress test which was low risk and showed no ischemia.  2D echo showed normal LVF with mild increased stiffness of heart muscle.     Wt Readings from Last 3 Encounters:  05/18/13 245 lb (111.131 kg)  05/16/13 244 lb (110.678 kg)  05/15/13 243 lb (110.224 kg)     Past Medical History  Diagnosis Date  . Rheumatoid arthritis(714.0)   . Depression   . Morbid obesity   . HSV-1 infection     Current Outpatient Prescriptions  Medication Sig Dispense Refill  . Abatacept (ORENCIA IV) Inject 1,000 mg into the vein every 14 (fourteen) days.       . Acyclovir-Hydrocortisone (XERESE) 5-1 % CREA APPLY TO FEVER BLISTER 5 TIMES PER DAY AS NEEDED      . benzonatate (TESSALON) 100 MG capsule Take 100 mg by mouth 3 (three) times daily as needed.      . clindamycin-benzoyl peroxide (BENZACLIN) gel Apply 1 application topically as needed (acne).       . cyclobenzaprine (FLEXERIL) 10 MG tablet Take 10 mg by mouth 3 (three) times daily as needed for muscle spasms.      . DULoxetine (CYMBALTA) 60 MG capsule Take 1 capsule (60 mg total) by mouth daily.  30 capsule  5  . EPINEPHrine (EPIPEN) 0.3 mg/0.3 mL DEVI Inject into right thigh for severe allergic reaction and then call 911  2 Device  1  . famciclovir (FAMVIR) 500  MG tablet Take 500 mg by mouth 3 (three) times daily as needed. Take three tablets at onset of fever blisters x 1 day      . fluticasone (FLONASE) 50 MCG/ACT nasal spray Place 2 sprays into the nose as needed for allergies.       Marland Kitchen HYDROcodone-acetaminophen (NORCO/VICODIN) 5-325 MG per tablet Take 1 tablet by mouth every 6 (six) hours as needed for pain.  15 tablet  0  . Methotrexate, Anti-Rheumatic, (METHOTREXATE, PF, [place]) Inject 25 mg into the skin once a week.      Marland Kitchen omeprazole (PRILOSEC) 20 MG capsule Take 20 mg by mouth daily.        . promethazine (PHENERGAN) 25 MG tablet Take 25 mg by mouth every 6 (six) hours as needed for nausea.       . ursodiol (ACTIGALL) 300 MG capsule Take 300 mg by mouth daily.       . VOLTAREN 1 % GEL        No current facility-administered medications for this visit.    Allergies:    Allergies  Allergen Reactions  . Flu Virus Vaccine Anaphylaxis    Social History:  The patient  reports that she has never smoked. She has never used smokeless tobacco. She reports that she does not drink alcohol or use illicit drugs.   Family History:  The patient's family history includes Hyperlipidemia in her mother.   ROS:  Please see the history of present illness.      All other systems reviewed and negative.   PHYSICAL EXAM: VS:  BP 127/90  Pulse 89  Ht 5' 1.5" (1.562 m)  Wt 245 lb (111.131 kg)  BMI 45.55 kg/m2 Well nourished, well developed, in no acute distress HEENT: normal Neck: no JVD Cardiac:  normal S1, S2; RRR; no murmur Lungs:  clear to auscultation bilaterally, no wheezing, rhonchi or rales Abd: soft, nontender, no hepatomegaly Ext: no edema Skin: warm and dry Neuro:  CNs 2-12 intact, no focal abnormalities noted       ASSESSMENT AND PLAN:  1. Atypical CP with no evidence of ischemia on nuclear stress test and normal LVF. 2.   Diastolic dysfunction - this may be part of her SOB.  She has lost 70 pounds and her SOB has not improved.  - I will start her on Cardizem CD 180mg  daily and see if her SOB improves any.  I will also check a BNP and if elevated start her on a small dose of diuretic 3.   Incomplete RBBB 4.   DOE most likely multifactorial from diastolic dysfunction and obesity  I reviewed the findings of her studies with her.  Her cardiac workup is normal and at this time she is cleared from a cardiac standpoint for surgery.  Signed, [name] Magic, MD 05/18/2013 1:58 PM

## 2013-05-18 NOTE — Patient Instructions (Signed)
Your physician has recommended you make the following change in your medication: Please add Cardizem 180 MG 1 tablet daily. I sent this into your pharmacy. It should be ready to pick up today  Your physician recommends that you go over to the lab today to have a BNP drawn.  Your physician recommends that you schedule a follow-up appointment in: 3-4 Weeks with Dr Mayford Knife

## 2013-05-21 ENCOUNTER — Encounter: Payer: Self-pay | Admitting: General Surgery

## 2013-06-04 DIAGNOSIS — R059 Cough, unspecified: Secondary | ICD-10-CM | POA: Insufficient documentation

## 2013-06-04 DIAGNOSIS — R05 Cough: Secondary | ICD-10-CM | POA: Insufficient documentation

## 2013-06-04 DIAGNOSIS — J020 Streptococcal pharyngitis: Secondary | ICD-10-CM | POA: Insufficient documentation

## 2013-06-04 DIAGNOSIS — B373 Candidiasis of vulva and vagina: Secondary | ICD-10-CM | POA: Insufficient documentation

## 2013-06-04 DIAGNOSIS — B3731 Acute candidiasis of vulva and vagina: Secondary | ICD-10-CM | POA: Insufficient documentation

## 2013-06-11 ENCOUNTER — Encounter: Payer: Self-pay | Admitting: *Deleted

## 2013-06-14 ENCOUNTER — Other Ambulatory Visit (HOSPITAL_COMMUNITY): Payer: Self-pay

## 2013-06-15 ENCOUNTER — Encounter (HOSPITAL_COMMUNITY)
Admission: RE | Admit: 2013-06-15 | Discharge: 2013-06-15 | Disposition: A | Discharge status: Home / Self Care | Payer: Managed Care, Other (non HMO) | Source: Ambulatory Visit | Attending: Rheumatology | Admitting: Rheumatology

## 2013-06-15 DIAGNOSIS — M069 Rheumatoid arthritis, unspecified: Secondary | ICD-10-CM | POA: Insufficient documentation

## 2013-06-15 MED ORDER — SODIUM CHLORIDE 0.9 % IV SOLN
INTRAVENOUS | Status: DC
  Administered 2013-06-15: 15:00:00 via INTRAVENOUS

## 2013-06-15 MED ORDER — SODIUM CHLORIDE 0.9 % IV SOLN
1000.0000 mg | INTRAVENOUS | Status: DC
  Administered 2013-06-15: 15:00:00 1000 mg via INTRAVENOUS
  Filled 2013-06-15: qty 40

## 2013-06-19 ENCOUNTER — Ambulatory Visit: Payer: Managed Care, Other (non HMO) | Admitting: Cardiology

## 2013-06-27 ENCOUNTER — Ambulatory Visit (INDEPENDENT_AMBULATORY_CARE_PROVIDER_SITE_OTHER): Payer: Managed Care, Other (non HMO) | Admitting: Cardiology

## 2013-06-27 ENCOUNTER — Encounter: Payer: Self-pay | Admitting: Cardiology

## 2013-06-27 ENCOUNTER — Encounter (INDEPENDENT_AMBULATORY_CARE_PROVIDER_SITE_OTHER): Payer: Managed Care, Other (non HMO)

## 2013-06-27 ENCOUNTER — Encounter: Payer: Self-pay | Admitting: Radiology

## 2013-06-27 VITALS — BP 108/80 | HR 80 | Ht 62.0 in | Wt 243.8 lb

## 2013-06-27 DIAGNOSIS — R0602 Shortness of breath: Secondary | ICD-10-CM

## 2013-06-27 DIAGNOSIS — I519 Heart disease, unspecified: Secondary | ICD-10-CM

## 2013-06-27 DIAGNOSIS — I5189 Other ill-defined heart diseases: Secondary | ICD-10-CM

## 2013-06-27 DIAGNOSIS — R002 Palpitations: Secondary | ICD-10-CM | POA: Insufficient documentation

## 2013-06-27 NOTE — Progress Notes (Signed)
Patient ID: [name] [name], female   DOB: 1965/09/15, 48 y.o.   MRN: [id number] [employer] 30 day monitor applied

## 2013-06-27 NOTE — Patient Instructions (Signed)
Your physician recommends that you continue on your current medications as directed. Please refer to the Current Medication list given to you today.  Your physician has recommended that you wear an event monitor. Event monitors are medical devices that record the heart's electrical activity. Doctors most often us these monitors to diagnose arrhythmias. Arrhythmias are problems with the speed or rhythm of the heartbeat. The monitor is a small, portable device. You can wear one while you do your normal daily activities. This is usually used to diagnose what is causing palpitations/syncope (passing out).  Your physician recommends that you schedule a follow-up appointment in: 4 Weeks with Dr Turner 

## 2013-06-27 NOTE — Progress Notes (Signed)
[address] [place], [place]  [address] Phone: [phone] Fax:  [phone]  Date:  06/27/2013   ID:  [name] [name], DOB 05/23/1966, MRN [id number]  PCP:  Birdena Jubilee, MD  Cardiologist:  Armanda Magic, MD     History of Present Illness:[name] [name] is a 48 year old female with past medical history of rheumatoid arthritis, depression, morbid obesity.  She recently saw me for chest pain and SOB and she underwent  nuclear stress test which was low risk and showed no ischemia. 2D echo showed normal LVF with mild increased stiffness of heart muscle. She now presents today for followup.  She was startd on Cardizem for diastolic dysfunction.  It was felt that her SOB was due to diastolic dysfunction and obesity.  She says that she is having a fluttering sensation in her chest which then causes her to be SOB.  She says that the fluttering occurs when she gets upset and anxious at work.  The fluttering occurs 2-3 times daily lasting a 2-3 seconds at at time.  Her CP has improved but still occurs mainly with the heart fluttering.   Wt Readings from Last 3 Encounters:  06/27/13 243 lb 12.8 oz (110.587 kg)  06/15/13 246 lb (111.585 kg)  05/18/13 245 lb (111.131 kg)     Past Medical History  Diagnosis Date  . Rheumatoid arthritis(714.0)   . Depression   . Morbid obesity   . HSV-1 infection   . Diastolic dysfunction     Current Outpatient Prescriptions  Medication Sig Dispense Refill  . Abatacept (ORENCIA IV) Inject 2,000 mg into the vein every 28 (twenty-eight) days.       . Acyclovir-Hydrocortisone (XERESE) 5-1 % CREA APPLY TO FEVER BLISTER 5 TIMES PER DAY AS NEEDED      . benzonatate (TESSALON) 100 MG capsule Take 100 mg by mouth 3 (three) times daily as needed.      . clindamycin-benzoyl peroxide (BENZACLIN) gel Apply 1 application topically as needed (acne).       . cyclobenzaprine (FLEXERIL) 10 MG tablet Take 10 mg by mouth 3 (three) times daily as needed  for muscle spasms.      Marland Kitchen diltiazem (CARDIZEM CD) 180 MG 24 hr capsule Take 1 capsule (180 mg total) by mouth daily.  90 capsule  3  . DULoxetine (CYMBALTA) 60 MG capsule Take 1 capsule (60 mg total) by mouth daily.  30 capsule  5  . EPINEPHrine (EPIPEN) 0.3 mg/0.3 mL DEVI Inject into right thigh for severe allergic reaction and then call 911  2 Device  1  . famciclovir (FAMVIR) 500 MG tablet Take 500 mg by mouth 3 (three) times daily as needed. Take three tablets at onset of fever blisters x 1 day      . fluticasone (FLONASE) 50 MCG/ACT nasal spray Place 2 sprays into the nose as needed for allergies.       Marland Kitchen HYDROcodone-acetaminophen (NORCO/VICODIN) 5-325 MG per tablet Take 1 tablet by mouth every 6 (six) hours as needed for pain.  15 tablet  0  . Methotrexate, Anti-Rheumatic, (METHOTREXATE, PF, [place]) Inject 25 mg into the skin once a week.      Marland Kitchen omeprazole (PRILOSEC) 20 MG capsule Take 20 mg by mouth daily.      . promethazine (PHENERGAN) 25 MG tablet Take 25 mg by mouth every 6 (six) hours as needed for nausea.       . ursodiol (ACTIGALL) 300 MG capsule Take 300 mg  by mouth daily.       . VOLTAREN 1 % GEL        No current facility-administered medications for this visit.    Allergies:    Allergies  Allergen Reactions  . Flu Virus Vaccine Anaphylaxis    Social History:  The patient  reports that she has never smoked. She has never used smokeless tobacco. She reports that she does not drink alcohol or use illicit drugs.   Family History:  The patient's family history includes Hyperlipidemia in her mother.   ROS:  Please see the history of present illness.      All other systems reviewed and negative.   PHYSICAL EXAM: VS:  BP 108/80  Pulse 80  Ht 5\' 2"  (1.575 m)  Wt 243 lb 12.8 oz (110.587 kg)  BMI 44.58 kg/m2 Well nourished, well developed, in no acute distress HEENT: normal Neck: no JVD Cardiac:  normal S1, S2; RRR; no murmur Lungs:  clear to auscultation bilaterally, no  wheezing, rhonchi or rales Abd: soft, nontender, no hepatomegaly Ext: no edema Skin: warm and dry Neuro:  CNs 2-12 intact, no focal abnormalities noted       ASSESSMENT AND PLAN:  1. Palpitations  - event monitor to assess for arrhythmias 2. Chronic SOB secondary to obesity and diastolic dysfunction but could also be from an arrhythmia 3. Diastolic dysfunction  - continue Diltiazem  Followup with me in 6 months  Signed, , MD 06/27/2013 4:40 PM

## 2013-07-05 ENCOUNTER — Other Ambulatory Visit: Payer: Self-pay | Admitting: *Deleted

## 2013-07-05 DIAGNOSIS — E785 Hyperlipidemia, unspecified: Secondary | ICD-10-CM

## 2013-07-05 DIAGNOSIS — R5381 Other malaise: Secondary | ICD-10-CM

## 2013-07-05 DIAGNOSIS — R5383 Other fatigue: Secondary | ICD-10-CM

## 2013-07-06 ENCOUNTER — Other Ambulatory Visit (INDEPENDENT_AMBULATORY_CARE_PROVIDER_SITE_OTHER): Payer: Managed Care, Other (non HMO)

## 2013-07-06 ENCOUNTER — Encounter: Payer: Managed Care, Other (non HMO) | Admitting: Family Medicine

## 2013-07-09 ENCOUNTER — Telehealth: Payer: Self-pay | Admitting: Cardiology

## 2013-07-09 NOTE — Telephone Encounter (Signed)
New message  Patient called in requesting to speak with Duwayne Heck,

## 2013-07-12 ENCOUNTER — Other Ambulatory Visit (HOSPITAL_COMMUNITY): Payer: Managed Care, Other (non HMO) | Admitting: *Deleted

## 2013-07-13 ENCOUNTER — Encounter (HOSPITAL_COMMUNITY)
Admission: RE | Admit: 2013-07-13 | Discharge: 2013-07-13 | Disposition: A | Discharge status: Home / Self Care | Payer: Managed Care, Other (non HMO) | Source: Ambulatory Visit | Attending: Rheumatology | Admitting: Rheumatology

## 2013-07-13 DIAGNOSIS — M069 Rheumatoid arthritis, unspecified: Secondary | ICD-10-CM | POA: Insufficient documentation

## 2013-07-13 MED ORDER — SODIUM CHLORIDE 0.9 % IV SOLN
1000.0000 mg | INTRAVENOUS | Status: DC
  Administered 2013-07-13: 1000 mg via INTRAVENOUS
  Filled 2013-07-13: qty 40

## 2013-07-13 MED ORDER — SODIUM CHLORIDE 0.9 % IV SOLN
INTRAVENOUS | Status: DC
  Administered 2013-07-13: 15:00:00 via INTRAVENOUS

## 2013-07-13 MED ORDER — SODIUM CHLORIDE 0.9 % IV SOLN: 250.0000 mg | INTRAVENOUS | Status: DC

## 2013-07-14 ENCOUNTER — Emergency Department (HOSPITAL_COMMUNITY)
Admission: EM | Admit: 2013-07-14 | Discharge: 2013-07-15 | Disposition: A | Discharge status: Home / Self Care | Payer: Managed Care, Other (non HMO) | Attending: Emergency Medicine | Admitting: Emergency Medicine

## 2013-07-14 ENCOUNTER — Emergency Department (HOSPITAL_COMMUNITY): Payer: Managed Care, Other (non HMO)

## 2013-07-14 ENCOUNTER — Encounter (HOSPITAL_COMMUNITY): Payer: Self-pay | Admitting: Emergency Medicine

## 2013-07-14 DIAGNOSIS — F3289 Other specified depressive episodes: Secondary | ICD-10-CM | POA: Insufficient documentation

## 2013-07-14 DIAGNOSIS — M069 Rheumatoid arthritis, unspecified: Secondary | ICD-10-CM | POA: Insufficient documentation

## 2013-07-14 DIAGNOSIS — IMO0002 Reserved for concepts with insufficient information to code with codable children: Secondary | ICD-10-CM | POA: Insufficient documentation

## 2013-07-14 DIAGNOSIS — R002 Palpitations: Secondary | ICD-10-CM | POA: Insufficient documentation

## 2013-07-14 DIAGNOSIS — F329 Major depressive disorder, single episode, unspecified: Secondary | ICD-10-CM | POA: Insufficient documentation

## 2013-07-14 DIAGNOSIS — I5030 Unspecified diastolic (congestive) heart failure: Secondary | ICD-10-CM | POA: Insufficient documentation

## 2013-07-14 DIAGNOSIS — Z79899 Other long term (current) drug therapy: Secondary | ICD-10-CM | POA: Insufficient documentation

## 2013-07-14 DIAGNOSIS — Z8619 Personal history of other infectious and parasitic diseases: Secondary | ICD-10-CM | POA: Insufficient documentation

## 2013-07-14 DIAGNOSIS — R609 Edema, unspecified: Secondary | ICD-10-CM | POA: Insufficient documentation

## 2013-07-14 DIAGNOSIS — R079 Chest pain, unspecified: Secondary | ICD-10-CM

## 2013-07-14 LAB — CBC
HEMATOCRIT: 42.3 % (ref 36.0–46.0)
HEMOGLOBIN: 13.8 g/dL (ref 12.0–15.0)
MCH: 27.9 pg (ref 26.0–34.0)
MCHC: 32.6 g/dL (ref 30.0–36.0)
MCV: 85.5 fL (ref 78.0–100.0)
Platelets: 287 10*3/uL (ref 150–400)
RBC: 4.95 MIL/uL (ref 3.87–5.11)
RDW: 12.8 % (ref 11.5–15.5)
WBC: 11.4 10*3/uL — AB (ref 4.0–10.5)

## 2013-07-14 MED ORDER — MORPHINE SULFATE 4 MG/ML IJ SOLN
4.0000 mg | Freq: Once | INTRAMUSCULAR | Status: AC
  Administered 2013-07-14: 4 mg via INTRAVENOUS
  Filled 2013-07-14: qty 1

## 2013-07-14 MED ORDER — ASPIRIN 81 MG PO CHEW
324.0000 mg | CHEWABLE_TABLET | Freq: Once | ORAL | Status: AC
  Administered 2013-07-14: 324 mg via ORAL
  Filled 2013-07-14: qty 4

## 2013-07-14 MED ORDER — NITROGLYCERIN 0.4 MG SL SUBL
0.4000 mg | SUBLINGUAL_TABLET | SUBLINGUAL | Status: DC | PRN
  Administered 2013-07-15: 0.4 mg via SUBLINGUAL
  Filled 2013-07-14: qty 25

## 2013-07-14 MED ORDER — ONDANSETRON HCL 4 MG/2ML IJ SOLN
4.0000 mg | Freq: Once | INTRAMUSCULAR | Status: AC
  Administered 2013-07-14: 4 mg via INTRAVENOUS
  Filled 2013-07-14: qty 2

## 2013-07-14 NOTE — ED Notes (Signed)
Patient from home. Patient states she started having chest pain at 1100 today. She states she has had this pain before. Patient is currently been on 24 hour monitor for 2 weeks.

## 2013-07-14 NOTE — ED Provider Notes (Signed)
CSN: [id number]     Arrival date & time 07/14/13  2210 History   First MD Initiated Contact with Patient 07/14/13 2320     Chief Complaint  Patient presents with  . Chest Pain   (Consider location/radiation/quality/duration/timing/severity/associated sxs/prior Treatment) HPI Hx per PT - CP onset around 11am, constant, MOD in severity, not radiating, no associated SOB, N/V, diaphoresis, pressure like without known aggravating or alleviating factors. Has h/o similar CPs, followed By [facility] Dr Mayford Knife, had a stress test in Dec 2014 reported as normal, also had ECHO at that time. She currently is wearing a holter monitor for intermittent palpitations - she has had some on and off palpitations today.  ] Past Medical History  Diagnosis Date  . Rheumatoid arthritis(714.0)   . Depression   . Morbid obesity   . HSV-1 infection   . Diastolic dysfunction    Past Surgical History  Procedure Laterality Date  . Cesarean section    . Tubal ligation    . Laparoscopic gastric sleeve resection  11/21/12    [facility]   Family History  Problem Relation Age of Onset  . Hyperlipidemia Mother    History  Substance Use Topics  . Smoking status: Never Smoker   . Smokeless tobacco: Never Used  . Alcohol Use: No   OB History   Grav Para Term Preterm Abortions TAB SAB Ect Mult Living                 Review of Systems  Constitutional: Negative for fever and chills.  Eyes: Negative for visual disturbance.  Respiratory: Negative for shortness of breath.   Cardiovascular: Positive for chest pain.  Gastrointestinal: Negative for abdominal pain.  Genitourinary: Negative for dysuria.  Musculoskeletal: Negative for back pain, neck pain and neck stiffness.  Skin: Negative for rash.  Neurological: Negative for headaches.  All other systems reviewed and are negative.    Allergies  Flu virus vaccine  Home Medications   Current Outpatient Rx  Name  Route  Sig  Dispense  Refill  . Abatacept (ORENCIA  IV)   Intravenous   Inject 1,000 mg into the vein every 28 (twenty-eight) days.          . Acyclovir-Hydrocortisone (XERESE) 5-1 % CREA      APPLY TO FEVER BLISTER 5 TIMES PER DAY AS NEEDED         . benzonatate (TESSALON) 100 MG capsule   Oral   Take 100 mg by mouth 3 (three) times daily as needed.         . clindamycin-benzoyl peroxide (BENZACLIN) gel   Topical   Apply 1 application topically as needed (acne).          . cyclobenzaprine (FLEXERIL) 10 MG tablet   Oral   Take 10 mg by mouth 3 (three) times daily as needed for muscle spasms.         Marland Kitchen diltiazem (CARDIZEM CD) 180 MG 24 hr capsule   Oral   Take 1 capsule (180 mg total) by mouth daily.   90 capsule   3   . DULoxetine (CYMBALTA) 60 MG capsule   Oral   Take 1 capsule (60 mg total) by mouth daily.   30 capsule   5   . EPINEPHrine (EPIPEN) 0.3 mg/0.3 mL DEVI      Inject into right thigh for severe allergic reaction and then call 911   2 Device   1   . famciclovir (FAMVIR) 500 MG tablet  Oral   Take 500 mg by mouth 3 (three) times daily as needed. Take three tablets at onset of fever blisters x 1 day         . fluticasone (FLONASE) 50 MCG/ACT nasal spray   Nasal   Place 2 sprays into the nose as needed for allergies.          Marland Kitchen HYDROcodone-acetaminophen (NORCO/VICODIN) 5-325 MG per tablet   Oral   Take 1 tablet by mouth every 6 (six) hours as needed for pain.   15 tablet   0   . methotrexate 25 MG/ML injection   Intramuscular   Inject 1 mL into the muscle once a week.         Marland Kitchen omeprazole (PRILOSEC) 20 MG capsule   Oral   Take 20 mg by mouth daily.         . promethazine (PHENERGAN) 25 MG tablet   Oral   Take 25 mg by mouth every 6 (six) hours as needed for nausea.          . ursodiol (ACTIGALL) 300 MG capsule   Oral   Take 300 mg by mouth daily.          . VOLTAREN 1 % GEL   Topical   Apply 2 g topically daily as needed.           BP 119/80  Pulse 75   Temp(Src) 97.8 F (36.6 C) (Oral)  Resp 19  SpO2 98% Physical Exam  Constitutional: She is oriented to person, place, and time. She appears well-developed and well-nourished.  HENT:  Head: Normocephalic and atraumatic.  Eyes: Conjunctivae and EOM are normal. Pupils are equal, round, and reactive to light.  Neck: Trachea normal. Neck supple. No thyromegaly present.  Cardiovascular: Normal rate, regular rhythm, S1 normal, S2 normal and normal pulses.     No systolic murmur is present   No diastolic murmur is present  Pulses:      Radial pulses are 2+ on the right side, and 2+ on the left side.  Pulmonary/Chest: Effort normal and breath sounds normal. She has no wheezes. She has no rhonchi. She has no rales.  Some sternal tenderness, no crepitus, no rash  Abdominal: Soft. Normal appearance and bowel sounds are normal. There is no tenderness. There is no CVA tenderness and negative Murphy's sign.  Musculoskeletal:  2 plus symmetric LE edema, calves nontender, no cords or erythema, negative Homans sign  Neurological: She is alert and oriented to person, place, and time. She has normal strength. No cranial nerve deficit or sensory deficit. GCS eye subscore is 4. GCS verbal subscore is 5. GCS motor subscore is 6.  Skin: Skin is warm and dry. No rash noted. She is not diaphoretic.  Psychiatric: Her speech is normal.  Cooperative and appropriate    ED Course  Procedures (including critical care time) Labs Review Labs Reviewed  CBC - Abnormal; Notable for the following:    WBC 11.4 (*)    All other components within normal limits  POCT I-STAT, CHEM 8 - Abnormal; Notable for the following:    Potassium 7.1 (*)    Calcium, Ion 1.03 (*)    All other components within normal limits  TROPONIN I  POTASSIUM  POCT I-STAT TROPONIN I   Imaging Review Dg Chest Portable 1 View  07/15/2013   CLINICAL DATA:  48 year old female with chest pain and shortness of breath.  EXAM: PORTABLE CHEST - 1 VIEW   COMPARISON:  04/03/2013  FINDINGS: The cardiomediastinal silhouette is unremarkable in this mildly low volume film.  There is no evidence of focal airspace disease, pulmonary edema, suspicious pulmonary nodule/mass, pleural effusion, or pneumothorax. No acute bony abnormalities are identified.  IMPRESSION: No active disease.   Electronically Signed   By: Laveda Abbe M.D.   On: 07/15/2013 00:10    EKG Interpretation    Date/Time:  Saturday July 14 2013 22:21:23 EST Ventricular Rate:  77 PR Interval:  126 QRS Duration: 95 QT Interval:  396 QTC Calculation: 448 R Axis:   86 Text Interpretation:  Sinus rhythm Nonspecific ST abnormality No significant change since last tracing Confirmed by Tonita Bills  MD, Marquese Burkland [phone]) on 07/14/2013 11:21:51 PM           Aspirin. Nitroglycerin. IV morphine.  On recheck pain improved  Serial troponins negative. Patient has cardiology followup. She agrees to call for followup Monday in the office and she agrees to strict return precautions. No arrhythmia and cardiac monitoring. Patient is appropriate for discharge home and outpatient followup at this time. MDM  Diagnosis: Chest pain, palpitations  Evaluated with EKG, troponins, chest x-ray labs are reviewed as above. Pain improved with medications including IV narcotics. Vital signs and nursing notes and previous records reviewed.  Sunnie Nielsen, MD 07/15/13 [phone]

## 2013-07-14 NOTE — ED Notes (Addendum)
[facility] EDP given chem 8 i-stat results. 

## 2013-07-15 LAB — POCT I-STAT TROPONIN I: Troponin i, poc: 0 ng/mL (ref 0.00–0.08)

## 2013-07-15 LAB — POTASSIUM: Potassium: 4.6 mEq/L (ref 3.7–5.3)

## 2013-07-15 LAB — TROPONIN I: Troponin I: <0.3 ng/mL (ref <0.30)

## 2013-07-15 MED ORDER — HYDROCODONE-ACETAMINOPHEN 5-325 MG PO TABS: 1.0000 | ORAL_TABLET | Freq: Four times a day (QID) | ORAL | Status: DC | PRN

## 2013-07-15 MED ORDER — MORPHINE SULFATE 4 MG/ML IJ SOLN
4.0000 mg | Freq: Once | INTRAMUSCULAR | Status: AC
  Administered 2013-07-15: 4 mg via INTRAVENOUS
  Filled 2013-07-15: qty 1

## 2013-07-15 NOTE — Discharge Instructions (Signed)
Chest Pain (Nonspecific) °It is often hard to give a specific diagnosis for the cause of chest pain. There is always a chance that your pain could be related to something serious, such as a heart attack or a blood clot in the lungs. You need to follow up with your caregiver for further evaluation. °CAUSES  °· Heartburn. °· Pneumonia or bronchitis. °· Anxiety or stress. °· Inflammation around your heart (pericarditis) or lung (pleuritis or pleurisy). °· A blood clot in the lung. °· A collapsed lung (pneumothorax). It can develop suddenly on its own (spontaneous pneumothorax) or from injury (trauma) to the chest. °· Shingles infection (herpes zoster virus). °The chest wall is composed of bones, muscles, and cartilage. Any of these can be the source of the pain. °· The bones can be bruised by injury. °· The muscles or cartilage can be strained by coughing or overwork. °· The cartilage can be affected by inflammation and become sore (costochondritis). °DIAGNOSIS  °Lab tests or other studies, such as X-rays, electrocardiography, stress testing, or cardiac imaging, may be needed to find the cause of your pain.  °TREATMENT  °· Treatment depends on what may be causing your chest pain. Treatment may include: °· Acid blockers for heartburn. °· Anti-inflammatory medicine. °· Pain medicine for inflammatory conditions. °· Antibiotics if an infection is present. °· You may be advised to change lifestyle habits. This includes stopping smoking and avoiding alcohol, caffeine, and chocolate. °· You may be advised to keep your head raised (elevated) when sleeping. This reduces the chance of acid going backward from your stomach into your esophagus. °· Most of the time, nonspecific chest pain will improve within 2 to 3 days with rest and mild pain medicine. °HOME CARE INSTRUCTIONS  °· If antibiotics were prescribed, take your antibiotics as directed. Finish them even if you start to feel better. °· For the next few days, avoid physical  activities that bring on chest pain. Continue physical activities as directed. °· Do not smoke. °· Avoid drinking alcohol. °· Only take over-the-counter or prescription medicine for pain, discomfort, or fever as directed by your caregiver. °· Follow your caregiver's suggestions for further testing if your chest pain does not go away. °· Keep any follow-up appointments you made. If you do not go to an appointment, you could develop lasting (chronic) problems with pain. If there is any problem keeping an appointment, you must call to reschedule. °SEEK MEDICAL CARE IF:  °· You think you are having problems from the medicine you are taking. Read your medicine instructions carefully. °· Your chest pain does not go away, even after treatment. °· You develop a rash with blisters on your chest. °SEEK IMMEDIATE MEDICAL CARE IF:  °· You have increased chest pain or pain that spreads to your arm, neck, jaw, back, or abdomen. °· You develop shortness of breath, an increasing cough, or you are coughing up blood. °· You have severe back or abdominal pain, feel nauseous, or vomit. °· You develop severe weakness, fainting, or chills. °· You have a fever. °THIS IS AN EMERGENCY. Do not wait to see if the pain will go away. Get medical help at once. Call your local emergency services (911 in [place] Do not drive yourself to the hospital. °MAKE SURE YOU:  °· Understand these instructions. °· Will watch your condition. °· Will get help right away if you are not doing well or get worse. °Document Released: 03/03/2005 Document Revised: 08/16/2011 Document Reviewed: 12/28/2007 °[employer]® Patient Information ©2014 [employer]   [employer]. ° °

## 2013-07-16 LAB — POCT I-STAT, CHEM 8
BUN: 23 mg/dL (ref 6–23)
CALCIUM ION: 1.03 mmol/L — AB (ref 1.12–1.23)
CHLORIDE: 107 meq/L (ref 96–112)
CREATININE: 0.9 mg/dL (ref 0.50–1.10)
GLUCOSE: 76 mg/dL (ref 70–99)
HEMATOCRIT: 43 % (ref 36.0–46.0)
Hemoglobin: 14.6 g/dL (ref 12.0–15.0)
POTASSIUM: 7.1 meq/L — AB (ref 3.7–5.3)
Sodium: 138 mEq/L (ref 137–147)
TCO2: 27 mmol/L (ref 0–100)

## 2013-07-18 ENCOUNTER — Encounter: Payer: Self-pay | Admitting: Cardiology

## 2013-07-18 ENCOUNTER — Ambulatory Visit (INDEPENDENT_AMBULATORY_CARE_PROVIDER_SITE_OTHER): Payer: Managed Care, Other (non HMO) | Admitting: Cardiology

## 2013-07-18 VITALS — BP 102/78 | HR 78 | Ht 62.0 in | Wt 244.8 lb

## 2013-07-18 DIAGNOSIS — I5189 Other ill-defined heart diseases: Secondary | ICD-10-CM

## 2013-07-18 DIAGNOSIS — I519 Heart disease, unspecified: Secondary | ICD-10-CM

## 2013-07-18 DIAGNOSIS — R0789 Other chest pain: Secondary | ICD-10-CM

## 2013-07-18 LAB — TROPONIN I: Troponin I: <0.01 ng/mL (ref <0.06)

## 2013-07-18 MED ORDER — METOPROLOL TARTRATE 25 MG PO TABS: ORAL_TABLET | ORAL | Status: DC

## 2013-07-18 NOTE — Progress Notes (Signed)
[address] [place], [place]  [address] Phone: [phone] Fax:  [phone]  Date:  07/18/2013   ID:  [name] [name], DOB 07-Mar-1966, MRN [id number]  PCP:  Birdena Jubilee, MD  Cardiologist:  Armanda Magic, MD     History of Present Illness: [name] [name] is a 48 y.o. female with a history of diastolic dysfunction, RA, depression who presents back today.  She initially saw me for atypical CP and cardiac workup with nuclear stress test and echo were normal with diastolic dysfunction on echo.  She was recently seen in the ER for CP that started around 11am on 2/8 and was constant and nonradiating with no associated SOB/N/V or diaphoresis.  It was a pressure that was nonexertional. The pain lasted about 3 hours constant with normal cardiac enzymes.  She was given some NTG with improvement.   She also had some palpitations at the same time.  She now presents back today for followup.  She says that earlier today she had more CP that she described as a tightness in the midsternal area that feels gassy but she cannot belch.  The CP started around 10am and has been constant since then with no improvement.  Of note she was started on steroids a week ago.     Wt Readings from Last 3 Encounters:  07/18/13 244 lb 12.8 oz (111.041 kg)  07/13/13 242 lb (109.77 kg)  06/27/13 243 lb 12.8 oz (110.587 kg)     Past Medical History  Diagnosis Date  . Rheumatoid arthritis(714.0)   . Depression   . Morbid obesity   . HSV-1 infection   . Diastolic dysfunction     Current Outpatient Prescriptions  Medication Sig Dispense Refill  . Abatacept (ORENCIA IV) Inject 1,000 mg into the vein every 28 (twenty-eight) days.       . Acyclovir-Hydrocortisone (XERESE) 5-1 % CREA APPLY TO FEVER BLISTER 5 TIMES PER DAY AS NEEDED      . Acyclovir-Hydrocortisone (XERESE) 5-1 % CREA Apply topically 5 times daily. PRN      . ALPRAZolam (XANAX) 0.5 MG tablet 0.5 mg.      . benzonatate (TESSALON) 100  MG capsule Take 100 mg by mouth 3 (three) times daily as needed.      . clindamycin (CLINDAGEL) 1 % gel Apply topically 2 times daily.      . clindamycin-benzoyl peroxide (BENZACLIN) gel Apply 1 application topically as needed (acne).       . cyclobenzaprine (FLEXERIL) 10 MG tablet Take 10 mg by mouth 3 (three) times daily as needed for muscle spasms.      Marland Kitchen diltiazem (CARDIZEM CD) 180 MG 24 hr capsule Take 1 capsule (180 mg total) by mouth daily.  90 capsule  3  . DULoxetine (CYMBALTA) 60 MG capsule Take 1 capsule (60 mg total) by mouth daily.  30 capsule  5  . EPINEPHrine (EPIPEN) 0.3 mg/0.3 mL DEVI Inject into right thigh for severe allergic reaction and then call 911  2 Device  1  . famciclovir (FAMVIR) 500 MG tablet Take 500 mg by mouth 3 (three) times daily as needed. Take three tablets at onset of fever blisters x 1 day      . fluticasone (FLONASE) 50 MCG/ACT nasal spray Place 2 sprays into the nose as needed for allergies.       . fluticasone (FLOVENT DISKUS) 50 MCG/BLIST diskus inhaler 1 puff as needed.      . Folic Acid-Vit B6-Vit  B12 (FABB) 2.2-25-1 MG TABS 1 tablet daily.      Marland Kitchen HYDROcodone-acetaminophen (NORCO/VICODIN) 5-325 MG per tablet Take 1 tablet by mouth every 6 (six) hours as needed for pain.  15 tablet  0  . HYDROcodone-acetaminophen (NORCO/VICODIN) 5-325 MG per tablet Take 1 tablet by mouth every 6 (six) hours as needed.  15 tablet  0  . lidocaine (LIDODERM) 5 % as needed.      . methotrexate 25 MG/ML injection Inject 1 mL into the muscle once a week.      . metroNIDAZOLE (METROGEL) 0.75 % gel Apply topically 2 times daily.      Marland Kitchen omeprazole (PRILOSEC) 20 MG capsule Take 20 mg by mouth daily.      . predniSONE (DELTASONE) 5 MG tablet 5 mg. As directed      . predniSONE (DELTASONE) 5 MG tablet       . promethazine (PHENERGAN) 25 MG tablet Take 25 mg by mouth every 6 (six) hours as needed for nausea.       . TL GARD RX 2.2-25-1 MG TABS       . traZODone (DESYREL) 50 MG  tablet as needed.      . ursodiol (ACTIGALL) 300 MG capsule Take 300 mg by mouth daily.       . VOLTAREN 1 % GEL Apply 2 g topically daily as needed.        No current facility-administered medications for this visit.    Allergies:    Allergies  Allergen Reactions  . Flu Virus Vaccine Anaphylaxis    Social History:  The patient  reports that she has never smoked. She has never used smokeless tobacco. She reports that she does not drink alcohol or use illicit drugs.   Family History:  The patient's family history includes Hyperlipidemia in her mother.   ROS:  Please see the history of present illness.      All other systems reviewed and negative.   PHYSICAL EXAM: VS:  BP 102/78  Pulse 78  Ht 5\' 2"  (1.575 m)  Wt 244 lb 12.8 oz (111.041 kg)  BMI 44.76 kg/m2 Well nourished, well developed, in no acute distress HEENT: normal Neck: no JVD Cardiac:  normal S1, S2; RRR; no murmur Lungs:  clear to auscultation bilaterally, no wheezing, rhonchi or rales Abd: soft, nontender, no hepatomegaly Ext: no edema Skin: warm and dry Neuro:  CNs 2-12 intact, no focal abnormalities noted     EKG:  NSR with no ST changes  ASSESSMENT AND PLAN:  1. Atypical CP with recent normal nuclear stress test and echo recently.  This CP seemed to start shortly after going on steroids.  Her EKG today shows NSR with no ST changes.   Since she is still having intermittent chest pain despite normal cardiac workup thus far I have recommended proceeding with Coronary artery CTA to evaluate calcium score and CAD.  I will check a stat CPK/MB and troponin as well as d-dimer today.   2. Diastolic dysfunction by echo 3. Palpitations  Followup with me in 6 months Signed, , MD 07/18/2013 4:40 PM

## 2013-07-18 NOTE — Patient Instructions (Addendum)
Your physician recommends that you return for lab work today for STAT CKMB and TROPONIN.  Your physician has requested that you have cardiac CT. Cardiac computed tomography (CT) is a painless test that uses an x-ray machine to take clear, detailed pictures of your heart. For further information please visit [email or web address] Please follow instruction sheet as given.  Your physician wants you to follow-up in: 6 months with Dr. Mayford Knife. You will receive a reminder letter in the mail two months in advance. If you don't receive a letter, please call our office to schedule the follow-up appointment.  Take Metoprolol Tartrate 25 mg the night before the CT scan and the morning of the CT.

## 2013-07-19 ENCOUNTER — Encounter: Payer: Self-pay | Admitting: Cardiology

## 2013-07-19 LAB — CREATININE KINASE MB: CK MB: 0.9 ng/mL (ref 0.3–4.0)

## 2013-07-23 ENCOUNTER — Ambulatory Visit (HOSPITAL_COMMUNITY): Admission: RE | Admit: 2013-07-23 | Payer: Managed Care, Other (non HMO) | Source: Ambulatory Visit

## 2013-07-23 ENCOUNTER — Ambulatory Visit (HOSPITAL_COMMUNITY)
Admission: RE | Admit: 2013-07-23 | Discharge: 2013-07-23 | Disposition: A | Discharge status: Home / Self Care | Payer: Managed Care, Other (non HMO) | Source: Ambulatory Visit | Attending: Cardiology | Admitting: Cardiology

## 2013-07-23 DIAGNOSIS — K449 Diaphragmatic hernia without obstruction or gangrene: Secondary | ICD-10-CM | POA: Insufficient documentation

## 2013-07-23 DIAGNOSIS — R0789 Other chest pain: Secondary | ICD-10-CM

## 2013-07-23 DIAGNOSIS — R911 Solitary pulmonary nodule: Secondary | ICD-10-CM | POA: Insufficient documentation

## 2013-07-23 DIAGNOSIS — R079 Chest pain, unspecified: Secondary | ICD-10-CM

## 2013-07-23 MED ORDER — NITROGLYCERIN 0.4 MG SL SUBL
0.4000 mg | SUBLINGUAL_TABLET | SUBLINGUAL | Status: DC | PRN
  Administered 2013-07-23: 0.4 mg via SUBLINGUAL
  Filled 2013-07-23: qty 25

## 2013-07-23 MED ORDER — METOPROLOL TARTRATE 1 MG/ML IV SOLN
5.0000 mg | Freq: Once | INTRAVENOUS | Status: AC
  Administered 2013-07-23: 5 mg via INTRAVENOUS
  Filled 2013-07-23: qty 5

## 2013-07-23 MED ORDER — METOPROLOL TARTRATE 1 MG/ML IV SOLN
INTRAVENOUS | Status: AC
  Filled 2013-07-23: qty 5

## 2013-07-23 MED ORDER — NITROGLYCERIN 0.4 MG SL SUBL
SUBLINGUAL_TABLET | SUBLINGUAL | Status: AC
  Filled 2013-07-23: qty 25

## 2013-07-23 MED ORDER — IOHEXOL 350 MG/ML SOLN
80.0000 mL | Freq: Once | INTRAVENOUS | Status: AC | PRN
  Administered 2013-07-23: 80 mL via INTRAVENOUS

## 2013-07-25 ENCOUNTER — Telehealth: Payer: Self-pay | Admitting: General Surgery

## 2013-07-25 ENCOUNTER — Telehealth: Payer: Self-pay | Admitting: Cardiology

## 2013-07-25 DIAGNOSIS — I2510 Atherosclerotic heart disease of native coronary artery without angina pectoris: Secondary | ICD-10-CM

## 2013-07-25 NOTE — Telephone Encounter (Signed)
CTA Heart: Dr Eden Emms read already. But Radiology read chest xray part. Right middle lobe nodule measuring 5 millimeters, if pt is at high risk for Brocohogenic carcinoma. F/U chest CT in 6-12 months recommended. If pt is at low risk f/u Chest CT at 12 months is recommended. Report is in EPIC.

## 2013-07-25 NOTE — Telephone Encounter (Signed)
Pt is aware. Sent to PCP to make aware.

## 2013-07-25 NOTE — Telephone Encounter (Signed)
Please let patient know that chest CT showed a lung nodule and print out report and send to her PCP to followup with repeat scan as indicated by Radiology

## 2013-07-25 NOTE — Telephone Encounter (Signed)
Labs ordered.

## 2013-07-26 ENCOUNTER — Telehealth: Payer: Self-pay | Admitting: General Surgery

## 2013-07-26 ENCOUNTER — Telehealth: Payer: Self-pay | Admitting: Cardiology

## 2013-07-26 NOTE — Telephone Encounter (Signed)
New message ° °Patient is returning your call, please call back. °

## 2013-07-26 NOTE — Telephone Encounter (Signed)
Spoke with pt

## 2013-07-26 NOTE — Telephone Encounter (Signed)
[name] [name] - patient has coronary artery calcifications on Cardiac CTA with no obstructive plaque - she also has severe muscle and joint aches from RA - has never been on statins but needs aggressive risk factor modification - suggestions?

## 2013-07-26 NOTE — Telephone Encounter (Signed)
Pt has been set up for fasting lab for cholesterol but she had a Lipid Panel in November of 2014 and these were her results  Total Cholest was 216 LDL 136 Trig 65 Chol/hdl ratio 3.2 HDL 67  Is this ok or do we need another Cholesterol

## 2013-07-26 NOTE — Telephone Encounter (Signed)
Is she on any cholesterol medication

## 2013-07-26 NOTE — Telephone Encounter (Signed)
Was unable to LVM due to box being full.

## 2013-07-26 NOTE — Telephone Encounter (Signed)
Pt does not taking any medications for her cholesterol

## 2013-07-26 NOTE — Telephone Encounter (Signed)
Her coronary calcium score showed 98th percentile for age and sex matched controls and thus needs to be on a daily statin.  Baseline LDL 136 mg/dL and would like to see at least < 100 mg/dL and prefer < 70 mg/dL.  Please have her set up to see me in lipid clinic.  I will discuss statin benefit / risk in setting of her severe muscle/joint aches from RA, and can discuss other possible options.  Needs 30 minute appoint in lipid clinic.  Thanks.

## 2013-07-26 NOTE — Telephone Encounter (Signed)
Pt is aware and set up to see jeremy at 2:15 on Monday 2/23

## 2013-07-27 ENCOUNTER — Ambulatory Visit (INDEPENDENT_AMBULATORY_CARE_PROVIDER_SITE_OTHER): Payer: Managed Care, Other (non HMO) | Admitting: Family Medicine

## 2013-07-27 ENCOUNTER — Encounter: Payer: Self-pay | Admitting: Family Medicine

## 2013-07-27 VITALS — BP 108/76 | HR 76 | Resp 16 | Wt 245.0 lb

## 2013-07-27 DIAGNOSIS — F411 Generalized anxiety disorder: Secondary | ICD-10-CM

## 2013-07-27 DIAGNOSIS — R911 Solitary pulmonary nodule: Secondary | ICD-10-CM

## 2013-07-27 MED ORDER — DULOXETINE HCL 60 MG PO CPEP: 60.0000 mg | ORAL_CAPSULE | Freq: Every day | ORAL | Status: DC

## 2013-07-27 MED ORDER — HYDROXYZINE PAMOATE 25 MG PO CAPS: ORAL_CAPSULE | ORAL | Status: DC

## 2013-07-27 NOTE — Progress Notes (Signed)
Subjective:    Patient ID: [name] [name], female    DOB: 1965-06-20, 48 y.o.   MRN: [id number]  HPI  [name] [name] is here today to go over a recent CT report.  She was sent for a CT by her cardiologist (Dr. Mayford Knife).  He went over the report with her and she was instructed to follow up with her PCP to decide how to follow up on the nodule seen on the CT scan.  She is very anxious about this CT scan.     Review of Systems  Constitutional: Negative for activity change, fatigue and unexpected weight change.  HENT: Negative.   Eyes: Negative.   Respiratory: Negative for shortness of breath.   Cardiovascular: Negative for chest pain, palpitations and leg swelling.  Gastrointestinal: Negative for diarrhea and constipation.  Endocrine: Negative.   Genitourinary: Negative for difficulty urinating.  Musculoskeletal: Negative.   Skin: Negative.   Neurological: Negative.   Hematological: Negative for adenopathy. Does not bruise/bleed easily.  Psychiatric/Behavioral: Negative for sleep disturbance and dysphoric mood. The patient is nervous/anxious.      Past Medical History  Diagnosis Date  . Rheumatoid arthritis(714.0)   . Depression   . Morbid obesity   . HSV-1 infection   . Diastolic dysfunction   . Fibromyalgia      Past Surgical History  Procedure Laterality Date  . Cesarean section    . Tubal ligation    . Laparoscopic gastric sleeve resection  11/21/12    [facility]     History   Social History Narrative   Marital Status: Married Probation officer)    Children:  Son [name]) Daughter [name])    Pets: None    Living Situation: Lives with husband and children    Occupation: Occupational psychologist Administrator)     Education: Oncologist (Psychology)     Tobacco Use/Exposure:  None    Alcohol Use:  Occasional   Drug Use:  None   Diet:  Regular   Exercise: Walking or Treadmill (2 x week)   Hobbies: Clinical cytogeneticist, [employer].                  Family History  Problem Relation Age of Onset  . Hyperlipidemia Mother   . Sarcoidosis Father      Current Outpatient Prescriptions on File Prior to Visit  Medication Sig Dispense Refill  . Abatacept (ORENCIA IV) Inject 1,000 mg into the vein every 28 (twenty-eight) days.       . Acyclovir-Hydrocortisone (XERESE) 5-1 % CREA APPLY TO FEVER BLISTER 5 TIMES PER DAY AS NEEDED      . clindamycin (CLINDAGEL) 1 % gel Apply topically 2 times daily.      . cyclobenzaprine (FLEXERIL) 10 MG tablet Take 10 mg by mouth 3 (three) times daily as needed for muscle spasms.      Marland Kitchen diltiazem (CARDIZEM CD) 180 MG 24 hr capsule Take 1 capsule (180 mg total) by mouth daily.  90 capsule  3  . EPINEPHrine (EPIPEN) 0.3 mg/0.3 mL DEVI Inject into right thigh for severe allergic reaction and then call 911  2 Device  1  . fluticasone (FLONASE) 50 MCG/ACT nasal spray Place 2 sprays into the nose as needed for allergies.       Marland Kitchen HYDROcodone-acetaminophen (NORCO/VICODIN) 5-325 MG per tablet Take 1 tablet by mouth every 6 (six) hours as needed for pain.  15 tablet  0  . lidocaine (LIDODERM) 5 % as needed.      Marland Kitchen  methotrexate 25 MG/ML injection Inject 1 mL into the muscle once a week.      Marland Kitchen omeprazole (PRILOSEC) 20 MG capsule Take 20 mg by mouth daily.      . predniSONE (DELTASONE) 5 MG tablet Take 5 mg by mouth as needed. As directed      . TL GARD RX 2.2-25-1 MG TABS       . VOLTAREN 1 % GEL Apply 2 g topically daily as needed.       . benzonatate (TESSALON) 100 MG capsule Take 100 mg by mouth 3 (three) times daily as needed.      . famciclovir (FAMVIR) 500 MG tablet Take 500 mg by mouth 3 (three) times daily as needed. Take three tablets at onset of fever blisters x 1 day       No current facility-administered medications on file prior to visit.     Allergies  Allergen Reactions  . Flu Virus Vaccine Anaphylaxis      Objective:   Physical Exam  Vitals reviewed. Constitutional: She is oriented to  person, place, and time.  Eyes: Conjunctivae are normal. No scleral icterus.  Neck: Neck supple. No thyromegaly present.  Cardiovascular: Normal rate, regular rhythm and normal heart sounds.   Pulmonary/Chest: Effort normal and breath sounds normal.  Musculoskeletal: She exhibits no edema and no tenderness.  Lymphadenopathy:    She has no cervical adenopathy.  Neurological: She is alert and oriented to person, place, and time.  Skin: Skin is warm and dry.  Psychiatric: She has a normal mood and affect. Her behavior is normal. Judgment and thought content normal.      Assessment & Plan:    Alexia was seen today for ct scan results.  Diagnoses and associated orders for this visit:  Solitary pulmonary nodule - Ambulatory referral to Pulmonology  Anxiety state, unspecified - DULoxetine (CYMBALTA) 60 MG capsule; Take 1 capsule (60 mg total) by mouth daily. - hydrOXYzine (VISTARIL) 25 MG capsule; Take 1 capsule BID prn for increased anxiety

## 2013-07-30 ENCOUNTER — Encounter: Payer: Managed Care, Other (non HMO) | Admitting: Family Medicine

## 2013-07-30 ENCOUNTER — Ambulatory Visit: Payer: Managed Care, Other (non HMO) | Admitting: Pharmacist

## 2013-08-01 ENCOUNTER — Ambulatory Visit: Payer: Managed Care, Other (non HMO) | Admitting: Cardiology

## 2013-08-02 ENCOUNTER — Ambulatory Visit: Payer: Managed Care, Other (non HMO) | Admitting: Pharmacist

## 2013-08-06 ENCOUNTER — Encounter: Payer: Managed Care, Other (non HMO) | Admitting: Family Medicine

## 2013-08-08 ENCOUNTER — Telehealth: Payer: Self-pay | Admitting: Cardiology

## 2013-08-08 NOTE — Telephone Encounter (Signed)
Yes it could be - Recommend followup with PCP

## 2013-08-08 NOTE — Telephone Encounter (Signed)
She did have a few PAC's which are extra beats from the top of the heart and are benign

## 2013-08-08 NOTE — Telephone Encounter (Signed)
Pt is aware.  

## 2013-08-08 NOTE — Telephone Encounter (Signed)
Please let patient know that heart monitor was fine 

## 2013-08-08 NOTE — Telephone Encounter (Signed)
Pt is aware. Pt has a hiatal Hernia she wants to know if any of the cp she feels could be related to that?

## 2013-08-10 ENCOUNTER — Encounter (HOSPITAL_COMMUNITY)
Admission: RE | Admit: 2013-08-10 | Discharge: 2013-08-10 | Disposition: A | Discharge status: Home / Self Care | Payer: Managed Care, Other (non HMO) | Source: Ambulatory Visit | Attending: Rheumatology | Admitting: Rheumatology

## 2013-08-10 DIAGNOSIS — M069 Rheumatoid arthritis, unspecified: Secondary | ICD-10-CM | POA: Insufficient documentation

## 2013-08-10 MED ORDER — SODIUM CHLORIDE 0.9 % IV SOLN
1000.0000 mg | INTRAVENOUS | Status: DC
  Administered 2013-08-10: 1000 mg via INTRAVENOUS
  Filled 2013-08-10: qty 40

## 2013-08-10 MED ORDER — SODIUM CHLORIDE 0.9 % IV SOLN
INTRAVENOUS | Status: DC
  Administered 2013-08-10: 16:00:00 via INTRAVENOUS

## 2013-08-20 ENCOUNTER — Institutional Professional Consult (permissible substitution): Payer: Managed Care, Other (non HMO) | Admitting: Pulmonary Disease

## 2013-08-21 ENCOUNTER — Institutional Professional Consult (permissible substitution): Payer: Managed Care, Other (non HMO) | Admitting: Critical Care Medicine

## 2013-08-27 ENCOUNTER — Encounter: Payer: Self-pay | Admitting: Family Medicine

## 2013-08-27 ENCOUNTER — Ambulatory Visit (INDEPENDENT_AMBULATORY_CARE_PROVIDER_SITE_OTHER): Payer: Managed Care, Other (non HMO) | Admitting: Family Medicine

## 2013-08-27 VITALS — BP 112/80 | HR 76 | Temp 97.4°F | Resp 16 | Wt 236.0 lb

## 2013-08-27 DIAGNOSIS — J020 Streptococcal pharyngitis: Secondary | ICD-10-CM

## 2013-08-27 DIAGNOSIS — J309 Allergic rhinitis, unspecified: Secondary | ICD-10-CM

## 2013-08-27 DIAGNOSIS — R070 Pain in throat: Secondary | ICD-10-CM

## 2013-08-27 LAB — POCT RAPID STREP A (OFFICE): Rapid Strep A Screen: POSITIVE — AB

## 2013-08-27 MED ORDER — CLINDAMYCIN HCL 300 MG PO CAPS: 300.0000 mg | ORAL_CAPSULE | Freq: Three times a day (TID) | ORAL | Status: AC

## 2013-08-27 MED ORDER — CETIRIZINE HCL 10 MG PO TABS: 10.0000 mg | ORAL_TABLET | Freq: Every day | ORAL | Status: DC

## 2013-08-27 MED ORDER — PSEUDOEPHEDRINE HCL ER 120 MG PO TB12: 120.0000 mg | ORAL_TABLET | Freq: Two times a day (BID) | ORAL | Status: AC | PRN

## 2013-08-27 NOTE — Progress Notes (Signed)
Subjective:    Patient ID: [name] [name], female    DOB: 01-30-1966, 48 y.o.   MRN: [id number]  [name] [name] is here today complaining of a sore throat that developed yesterday.  She has been having other URI symptoms which she was trying to manage herself but her throat has worsened and she wanted to make sure that she does not have strep throat again.       Sore Throat  This is a new problem. The current episode started in the past 7 days. The problem has been waxing and waning. The pain is at a severity of 8/10. The pain is severe. Associated symptoms include congestion, headaches and a plugged ear sensation. Pertinent negatives include no coughing, ear pain, neck pain, shortness of breath or trouble swallowing. Treatments tried: Mucinex Liquid  The treatment provided mild relief.     Review of Systems  Constitutional: Negative for fever and chills.  HENT: Positive for congestion, postnasal drip, rhinorrhea, sinus pressure, sneezing and sore throat. Negative for ear pain, trouble swallowing and voice change.   Eyes: Negative.  Negative for itching.  Respiratory: Negative for cough and shortness of breath.   Cardiovascular: Negative for chest pain.  Gastrointestinal: Negative.   Endocrine: Negative.   Musculoskeletal: Negative.  Negative for neck pain and neck stiffness.  Skin: Negative for rash.  Neurological: Positive for headaches.  Hematological: Negative for adenopathy.     Past Medical History  Diagnosis Date  . Rheumatoid arthritis(714.0)   . Depression   . Morbid obesity   . HSV-1 infection   . Diastolic dysfunction   . Fibromyalgia      Past Surgical History  Procedure Laterality Date  . Cesarean section    . Tubal ligation    . Laparoscopic gastric sleeve resection  11/21/12    [facility]     History   Social History Narrative   Marital Status: Married Probation officer)    Children:  Son [name] [name]) Daughter [name] [name])    Pets: None    Living  Situation: Lives with husband and children    Occupation: Occupational psychologist Administrator)     Education: Oncologist (Psychology)     Tobacco Use/Exposure:  None    Alcohol Use:  Occasional   Drug Use:  None   Diet:  Regular   Exercise: Walking or Treadmill (2 x week)   Hobbies: Clinical cytogeneticist, [employer].                 Family History  Problem Relation Age of Onset  . Hyperlipidemia Mother   . Sarcoidosis Father      Current Outpatient Prescriptions on File Prior to Visit  Medication Sig Dispense Refill  . Abatacept (ORENCIA IV) Inject 1,000 mg into the vein every 28 (twenty-eight) days.       . Acyclovir-Hydrocortisone (XERESE) 5-1 % CREA APPLY TO FEVER BLISTER 5 TIMES PER DAY AS NEEDED      . benzonatate (TESSALON) 100 MG capsule Take 100 mg by mouth 3 (three) times daily as needed.      . clindamycin (CLINDAGEL) 1 % gel Apply topically 2 times daily.      . cyclobenzaprine (FLEXERIL) 10 MG tablet Take 10 mg by mouth 3 (three) times daily as needed for muscle spasms.      Marland Kitchen diltiazem (CARDIZEM CD) 180 MG 24 hr capsule Take 1 capsule (180 mg total) by mouth daily.  90 capsule  3  . DULoxetine (CYMBALTA) 60 MG capsule Take  1 capsule (60 mg total) by mouth daily.  30 capsule  5  . famciclovir (FAMVIR) 500 MG tablet Take 500 mg by mouth 3 (three) times daily as needed. Take three tablets at onset of fever blisters x 1 day      . fluticasone (FLONASE) 50 MCG/ACT nasal spray Place 2 sprays into the nose as needed for allergies.       Marland Kitchen HYDROcodone-acetaminophen (NORCO/VICODIN) 5-325 MG per tablet Take 1 tablet by mouth every 6 (six) hours as needed for pain.  15 tablet  0  . hydrOXYzine (VISTARIL) 25 MG capsule Take 1 capsule BID prn for increased anxiety  60 capsule  1  . lidocaine (LIDODERM) 5 % as needed.      . methotrexate 25 MG/ML injection Inject 1 mL into the muscle once a week.      Marland Kitchen omeprazole (PRILOSEC) 20 MG capsule Take 20 mg by mouth daily.        . predniSONE (DELTASONE) 5 MG tablet Take 5 mg by mouth as needed. As directed      . TL GARD RX 2.2-25-1 MG TABS       . VOLTAREN 1 % GEL Apply 2 g topically daily as needed.       Marland Kitchen EPINEPHrine (EPIPEN) 0.3 mg/0.3 mL DEVI Inject into right thigh for severe allergic reaction and then call 911  2 Device  1   No current facility-administered medications on file prior to visit.     Allergies  Allergen Reactions  . Flu Virus Vaccine Anaphylaxis       Objective:   Physical Exam  Nursing note and vitals reviewed. Constitutional: She appears well-nourished. No distress.  HENT:  Head: Normocephalic.  Right Ear: Tympanic membrane normal.  Left Ear: Tympanic membrane normal.  Nose: Nose normal. Right sinus exhibits no maxillary sinus tenderness and no frontal sinus tenderness. Left sinus exhibits no maxillary sinus tenderness and no frontal sinus tenderness.  Mouth/Throat: Mucous membranes are normal. No oral lesions. Posterior oropharyngeal erythema present.  Eyes: Conjunctivae are normal.  Neck: Neck supple.  Cardiovascular: Normal rate and regular rhythm.   Pulmonary/Chest: Effort normal and breath sounds normal.  Lymphadenopathy:    She has no cervical adenopathy.  Neurological: She is alert.  Skin: Skin is warm. No rash noted.  Psychiatric: She has a normal mood and affect. Her speech is normal. Judgment and thought content normal. Cognition and memory are normal.        Assessment & Plan:    [name] [name] was seen today for sore throat.  Diagnoses and associated orders for this visit:  Throat pain - POCT rapid strep A  Streptococcal sore throat - clindamycin (CLEOCIN) 300 MG capsule; Take 1 capsule (300 mg total) by mouth 3 (three) times daily. Take for 10 days  Allergic rhinitis - cetirizine (ZYRTEC) 10 MG tablet; Take 1 tablet (10 mg total) by mouth daily. - pseudoephedrine (SUDAFED) 120 MG 12 hr tablet; Take 1 tablet (120 mg total) by mouth every 12 (twelve) hours  as needed for congestion.

## 2013-08-28 ENCOUNTER — Ambulatory Visit (INDEPENDENT_AMBULATORY_CARE_PROVIDER_SITE_OTHER): Payer: Managed Care, Other (non HMO) | Admitting: Pulmonary Disease

## 2013-08-28 ENCOUNTER — Encounter: Payer: Self-pay | Admitting: Pulmonary Disease

## 2013-08-28 VITALS — BP 112/80 | HR 97 | Temp 97.2°F | Ht 62.0 in | Wt 239.2 lb

## 2013-08-28 DIAGNOSIS — R0602 Shortness of breath: Secondary | ICD-10-CM

## 2013-08-28 DIAGNOSIS — R911 Solitary pulmonary nodule: Secondary | ICD-10-CM | POA: Insufficient documentation

## 2013-08-28 DIAGNOSIS — J90 Pleural effusion, not elsewhere classified: Secondary | ICD-10-CM

## 2013-08-28 NOTE — Patient Instructions (Signed)
We will arrange pulmonary function testing for you We will arrange a CT scan of your lungs in one year for the nodule We will set up a follow up appointment for you in 4-6 weeks in case the pulmonary function test is abnormal, if you hear from [place] that it is normal, then my recommendation is that you exercise regularly and lose weight You should see [place] in a year after the repeat CT scan

## 2013-08-28 NOTE — Assessment & Plan Note (Addendum)
This nodule is small, round and she is a young non-smoker so the overall cancer risk is low.    Plan: -repeat CT chest 07/2014

## 2013-08-28 NOTE — Progress Notes (Signed)
Subjective:    Patient ID: [name] [name], female    DOB: [birth year], 48 y.o.   MRN: [id number]  HPI  [name] [name] recently had some shortness of breath so she was sent for a CT scan of heart and was found to have a pulmonary nodule.   She has been dealing with dyspnea for the last year or so which occurs with exertion and at rest.  It occurs with just minimal exertion like just getting up and walking around. She has lost 65 pounds with a sleeve gastrectomy so she has been surprised that the dyspnea has no improved.  She does not have chest pain.  She occasionally has a dry cough.  Her weight has been stable.  She was recently evaluated by cardiology and this was found to be normal.  She does have rheumatoid arthritis and is treated with Methotrexate for rheumatoid arthritis and Abacept injections.  Past Medical History  Diagnosis Date  . Rheumatoid arthritis(714.0)   . Depression   . Morbid obesity   . HSV-1 infection   . Diastolic dysfunction      Family History  Problem Relation Age of Onset  . Hyperlipidemia Mother   . Sarcoidosis Father      History   Social History  . Marital Status: Married    Spouse Name: [name]     Number of Children: 2  . Years of Education: 16   Occupational History  . CUSTOMER SERVICE [employer]   Social History Main Topics  . Smoking status: Never Smoker   . Smokeless tobacco: Never Used  . Alcohol Use: No  . Drug Use: No  . Sexual Activity: Yes    Partners: Male    Birth Control/ Protection: None   Other Topics Concern  . Not on file   Social History Narrative   Marital Status: Married Probation officer)    Children:  Son [name]) Daughter [name])    Pets: None    Living Situation: Lives with husband and children    Occupation: Occupational psychologist Administrator)     Education: Oncologist (Psychology)     Tobacco Use/Exposure:  None    Alcohol Use:  Occasional   Drug Use:  None   Diet:  Regular   Exercise:  Walking or Treadmill (2 x week)   Hobbies: Clinical cytogeneticist, [employer].                 Allergies  Allergen Reactions  . Flu Virus Vaccine Anaphylaxis     Outpatient Prescriptions Prior to Visit  Medication Sig Dispense Refill  . Abatacept (ORENCIA IV) Inject 1,000 mg into the vein every 28 (twenty-eight) days.       . Acyclovir-Hydrocortisone (XERESE) 5-1 % CREA APPLY TO FEVER BLISTER 5 TIMES PER DAY AS NEEDED      . benzonatate (TESSALON) 100 MG capsule Take 100 mg by mouth 3 (three) times daily as needed.      . cetirizine (ZYRTEC) 10 MG tablet Take 1 tablet (10 mg total) by mouth daily.  100 tablet  3  . clindamycin (CLEOCIN) 300 MG capsule Take 1 capsule (300 mg total) by mouth 3 (three) times daily. Take for 10 days  30 capsule  0  . clindamycin (CLINDAGEL) 1 % gel Apply topically 2 times daily.      . cyclobenzaprine (FLEXERIL) 10 MG tablet Take 10 mg by mouth 3 (three) times daily as needed for muscle spasms.      Marland Kitchen diltiazem (CARDIZEM CD)  180 MG 24 hr capsule Take 1 capsule (180 mg total) by mouth daily.  90 capsule  3  . DULoxetine (CYMBALTA) 60 MG capsule Take 1 capsule (60 mg total) by mouth daily.  30 capsule  5  . EPINEPHrine (EPIPEN) 0.3 mg/0.3 mL DEVI Inject into right thigh for severe allergic reaction and then call 911  2 Device  1  . famciclovir (FAMVIR) 500 MG tablet Take 500 mg by mouth 3 (three) times daily as needed. Take three tablets at onset of fever blisters x 1 day      . fluticasone (FLONASE) 50 MCG/ACT nasal spray Place 2 sprays into the nose as needed for allergies.       Marland Kitchen HYDROcodone-acetaminophen (NORCO/VICODIN) 5-325 MG per tablet Take 1 tablet by mouth every 6 (six) hours as needed for pain.  15 tablet  0  . hydrOXYzine (VISTARIL) 25 MG capsule Take 1 capsule BID prn for increased anxiety  60 capsule  1  . lidocaine (LIDODERM) 5 % as needed.      . methotrexate 25 MG/ML injection Inject 1 mL into the muscle once a week.      Marland Kitchen omeprazole  (PRILOSEC) 20 MG capsule Take 20 mg by mouth daily.      . predniSONE (DELTASONE) 5 MG tablet Take 5 mg by mouth as needed. As directed      . TL GARD RX 2.2-25-1 MG TABS       . VOLTAREN 1 % GEL Apply 2 g topically daily as needed.       . metroNIDAZOLE (METROGEL) 0.75 % gel Apply topically.       No facility-administered medications prior to visit.      Review of Systems  Constitutional: Negative for fever and unexpected weight change.  HENT: Positive for congestion, ear pain, postnasal drip, rhinorrhea, sinus pressure, sore throat and trouble swallowing. Negative for dental problem, nosebleeds and sneezing.   Eyes: Negative for redness and itching.  Respiratory: Positive for cough and shortness of breath. Negative for chest tightness and wheezing.   Cardiovascular: Positive for palpitations. Negative for leg swelling.  Gastrointestinal: Negative for nausea and vomiting.  Genitourinary: Negative for dysuria.  Musculoskeletal: Negative for joint swelling.  Skin: Negative for rash.  Neurological: Positive for headaches.  Hematological: Does not bruise/bleed easily.  Psychiatric/Behavioral: Negative for dysphoric mood. The patient is not nervous/anxious.        Objective:   Physical Exam Filed Vitals:   08/28/13 1118  BP: 112/80  Pulse: 97  Temp: 97.2 F (36.2 C)  TempSrc: Oral  Height: 5\' 2"  (1.575 m)  Weight: 239 lb 3.2 oz (108.5 kg)  SpO2: 100%   Ambulatory O2 saturation 98%  RA  Gen: well appearing, no acute distress HEENT: NCAT, PERRL, EOMi, OP clear, neck supple without masses PULM: CTA B CV: RRR, no mgr, no JVD AB: BS+, soft, nontender, no hsm Ext: warm, no edema, no clubbing, no cyanosis Derm: no rash or skin breakdown Neuro: A&Ox4, CN II-XII intact, strength 5/5 in all 4 extremities        Assessment & Plan:   SOB (shortness of breath) A very recent and thorough cardiac exam was normal.  Today she has a normal lung exam and ambulatory oximetry, so  the likelihood of lung disease is low.  Further, a recent hemoglobin was normal and her neurologic exam today is normal.  So the most likely etiology for dyspnea is obesity and deconditioning.  Plan: -check full PFT for completeness  sake -if PFT normal, recommend diet and exercise  Solitary pulmonary nodule This nodule is small, round and she is a young non-smoker so the overall cancer risk is low.    Plan: -repeat CT chest 07/2014   Updated Medication List Outpatient Encounter Prescriptions as of 08/28/2013  Medication Sig  . Abatacept (ORENCIA IV) Inject 1,000 mg into the vein every 28 (twenty-eight) days.   . Acyclovir-Hydrocortisone (XERESE) 5-1 % CREA APPLY TO FEVER BLISTER 5 TIMES PER DAY AS NEEDED  . benzonatate (TESSALON) 100 MG capsule Take 100 mg by mouth 3 (three) times daily as needed.  . cetirizine (ZYRTEC) 10 MG tablet Take 1 tablet (10 mg total) by mouth daily.  . clindamycin (CLEOCIN) 300 MG capsule Take 1 capsule (300 mg total) by mouth 3 (three) times daily. Take for 10 days  . clindamycin (CLINDAGEL) 1 % gel Apply topically 2 times daily.  . cyclobenzaprine (FLEXERIL) 10 MG tablet Take 10 mg by mouth 3 (three) times daily as needed for muscle spasms.  Marland Kitchen. diltiazem (CARDIZEM CD) 180 MG 24 hr capsule Take 1 capsule (180 mg total) by mouth daily.  . DULoxetine (CYMBALTA) 60 MG capsule Take 1 capsule (60 mg total) by mouth daily.  Marland Kitchen. EPINEPHrine (EPIPEN) 0.3 mg/0.3 mL DEVI Inject into right thigh for severe allergic reaction and then call 911  . famciclovir (FAMVIR) 500 MG tablet Take 500 mg by mouth 3 (three) times daily as needed. Take three tablets at onset of fever blisters x 1 day  . fluticasone (FLONASE) 50 MCG/ACT nasal spray Place 2 sprays into the nose as needed for allergies.   Marland Kitchen. gabapentin (NEURONTIN) 100 MG capsule Take 1 capsule by mouth 3 (three) times daily.  Marland Kitchen. HYDROcodone-acetaminophen (NORCO/VICODIN) 5-325 MG per tablet Take 1 tablet by mouth every 6 (six)  hours as needed for pain.  . hydrOXYzine (VISTARIL) 25 MG capsule Take 1 capsule BID prn for increased anxiety  . lidocaine (LIDODERM) 5 % as needed.  . methotrexate 25 MG/ML injection Inject 1 mL into the muscle once a week.  Marland Kitchen. omeprazole (PRILOSEC) 20 MG capsule Take 20 mg by mouth daily.  . predniSONE (DELTASONE) 5 MG tablet Take 5 mg by mouth as needed. As directed  . TL GARD RX 2.2-25-1 MG TABS   . VOLTAREN 1 % GEL Apply 2 g topically daily as needed.   . [DISCONTINUED] metroNIDAZOLE (METROGEL) 0.75 % gel Apply topically.

## 2013-08-28 NOTE — Assessment & Plan Note (Signed)
A very recent and thorough cardiac exam was normal.  Today she has a normal lung exam and ambulatory oximetry, so the likelihood of lung disease is low.  Further, a recent hemoglobin was normal and her neurologic exam today is normal.  So the most likely etiology for dyspnea is obesity and deconditioning.  Plan: -check full PFT for completeness sake -if PFT normal, recommend diet and exercise

## 2013-08-29 ENCOUNTER — Encounter (HOSPITAL_BASED_OUTPATIENT_CLINIC_OR_DEPARTMENT_OTHER): Payer: Self-pay | Admitting: Emergency Medicine

## 2013-08-29 ENCOUNTER — Emergency Department (HOSPITAL_BASED_OUTPATIENT_CLINIC_OR_DEPARTMENT_OTHER): Payer: Managed Care, Other (non HMO)

## 2013-08-29 ENCOUNTER — Emergency Department (HOSPITAL_BASED_OUTPATIENT_CLINIC_OR_DEPARTMENT_OTHER)
Admission: EM | Admit: 2013-08-29 | Discharge: 2013-08-29 | Disposition: A | Discharge status: Home / Self Care | Payer: Managed Care, Other (non HMO) | Attending: Emergency Medicine | Admitting: Emergency Medicine

## 2013-08-29 DIAGNOSIS — M069 Rheumatoid arthritis, unspecified: Secondary | ICD-10-CM | POA: Insufficient documentation

## 2013-08-29 DIAGNOSIS — Z79899 Other long term (current) drug therapy: Secondary | ICD-10-CM | POA: Insufficient documentation

## 2013-08-29 DIAGNOSIS — Z8619 Personal history of other infectious and parasitic diseases: Secondary | ICD-10-CM | POA: Insufficient documentation

## 2013-08-29 DIAGNOSIS — Z8679 Personal history of other diseases of the circulatory system: Secondary | ICD-10-CM | POA: Insufficient documentation

## 2013-08-29 DIAGNOSIS — J020 Streptococcal pharyngitis: Secondary | ICD-10-CM | POA: Insufficient documentation

## 2013-08-29 DIAGNOSIS — F329 Major depressive disorder, single episode, unspecified: Secondary | ICD-10-CM | POA: Insufficient documentation

## 2013-08-29 DIAGNOSIS — F3289 Other specified depressive episodes: Secondary | ICD-10-CM | POA: Insufficient documentation

## 2013-08-29 DIAGNOSIS — Z792 Long term (current) use of antibiotics: Secondary | ICD-10-CM | POA: Insufficient documentation

## 2013-08-29 HISTORY — DX: Fibromyalgia: M79.7

## 2013-08-29 MED ORDER — PENICILLIN G BENZATHINE 1200000 UNIT/2ML IM SUSP
1.2000 10*6.[IU] | Freq: Once | INTRAMUSCULAR | Status: AC
  Administered 2013-08-29: 1.2 10*6.[IU] via INTRAMUSCULAR
  Filled 2013-08-29: qty 2

## 2013-08-29 MED ORDER — SODIUM CHLORIDE 0.9 % IV BOLUS (SEPSIS)
1000.0000 mL | Freq: Once | INTRAVENOUS | Status: AC
  Administered 2013-08-29: 1000 mL via INTRAVENOUS

## 2013-08-29 NOTE — ED Notes (Signed)
Pt was seen and treated Monday for strep,  States onset last pm having cough, dizzy  Fever up to 100, generalized body aches

## 2013-08-29 NOTE — ED Provider Notes (Signed)
  Medical screening examination/treatment/procedure(s) were performed by non-physician practitioner and as supervising physician I was immediately available for consultation/collaboration.   EKG Interpretation None         Maree Ainley, MD 08/29/13 2342 

## 2013-08-29 NOTE — ED Provider Notes (Signed)
CSN: [id number]     Arrival date & time 08/29/13  1839 History   First MD Initiated Contact with Patient 08/29/13 1902     Chief Complaint  Patient presents with  . Fever     (Consider location/radiation/quality/duration/timing/severity/associated sxs/prior Treatment) HPI Comments: Pt was started on clindamycin 2 days ago for strep. Pt states that she is feeling worse not better.pt states that she is still have body aches. She states that she has developed a cough as well. Denies vomiting, diarrhea.  The history is provided by the patient. No language interpreter was used.    Past Medical History  Diagnosis Date  . Rheumatoid arthritis(714.0)   . Depression   . Morbid obesity   . HSV-1 infection   . Diastolic dysfunction   . Fibromyalgia    Past Surgical History  Procedure Laterality Date  . Cesarean section    . Tubal ligation    . Laparoscopic gastric sleeve resection  11/21/12    [facility]   Family History  Problem Relation Age of Onset  . Hyperlipidemia Mother   . Sarcoidosis Father    History  Substance Use Topics  . Smoking status: Never Smoker   . Smokeless tobacco: Never Used  . Alcohol Use: No   OB History   Grav Para Term Preterm Abortions TAB SAB Ect Mult Living                 Review of Systems  Constitutional: Positive for chills.  HENT: Positive for sore throat.   Respiratory: Positive for cough.       Allergies  Flu virus vaccine  Home Medications   Current Outpatient Rx  Name  Route  Sig  Dispense  Refill  . Abatacept (ORENCIA IV)   Intravenous   Inject 1,000 mg into the vein every 28 (twenty-eight) days.          . Acyclovir-Hydrocortisone (XERESE) 5-1 % CREA      APPLY TO FEVER BLISTER 5 TIMES PER DAY AS NEEDED         . benzonatate (TESSALON) 100 MG capsule   Oral   Take 100 mg by mouth 3 (three) times daily as needed.         . cetirizine (ZYRTEC) 10 MG tablet   Oral   Take 1 tablet (10 mg total) by mouth daily.  100 tablet   3   . clindamycin (CLEOCIN) 300 MG capsule   Oral   Take 1 capsule (300 mg total) by mouth 3 (three) times daily. Take for 10 days   30 capsule   0   . clindamycin (CLINDAGEL) 1 % gel      Apply topically 2 times daily.         . cyclobenzaprine (FLEXERIL) 10 MG tablet   Oral   Take 10 mg by mouth 3 (three) times daily as needed for muscle spasms.         Marland Kitchen diltiazem (CARDIZEM CD) 180 MG 24 hr capsule   Oral   Take 1 capsule (180 mg total) by mouth daily.   90 capsule   3   . DULoxetine (CYMBALTA) 60 MG capsule   Oral   Take 1 capsule (60 mg total) by mouth daily.   30 capsule   5   . EPINEPHrine (EPIPEN) 0.3 mg/0.3 mL DEVI      Inject into right thigh for severe allergic reaction and then call 911   2 Device   1   .  famciclovir (FAMVIR) 500 MG tablet   Oral   Take 500 mg by mouth 3 (three) times daily as needed. Take three tablets at onset of fever blisters x 1 day         . fluticasone (FLONASE) 50 MCG/ACT nasal spray   Nasal   Place 2 sprays into the nose as needed for allergies.          Marland Kitchen gabapentin (NEURONTIN) 100 MG capsule   Oral   Take 1 capsule by mouth 3 (three) times daily.         Marland Kitchen HYDROcodone-acetaminophen (NORCO/VICODIN) 5-325 MG per tablet   Oral   Take 1 tablet by mouth every 6 (six) hours as needed for pain.   15 tablet   0   . hydrOXYzine (VISTARIL) 25 MG capsule      Take 1 capsule BID prn for increased anxiety   60 capsule   1   . lidocaine (LIDODERM) 5 %      as needed.         . methotrexate 25 MG/ML injection   Intramuscular   Inject 1 mL into the muscle once a week.         Marland Kitchen omeprazole (PRILOSEC) 20 MG capsule   Oral   Take 20 mg by mouth daily.         . predniSONE (DELTASONE) 5 MG tablet   Oral   Take 5 mg by mouth as needed. As directed         . TL GARD RX 2.2-25-1 MG TABS               . VOLTAREN 1 % GEL   Topical   Apply 2 g topically daily as needed.           BP  110/48  Pulse 83  Temp(Src) 98.8 F (37.1 C) (Oral)  Resp 20  Ht 5\' 2"  (1.575 m)  Wt 239 lb (108.41 kg)  BMI 43.70 kg/m2  SpO2 100% Physical Exam  Nursing note and vitals reviewed. Constitutional: She is oriented to person, place, and time. She appears well-developed and well-nourished.  HENT:  Head: Normocephalic.  Right Ear: External ear normal.  Left Ear: External ear normal.  Mouth/Throat: Posterior oropharyngeal erythema present.  Eyes: Conjunctivae and EOM are normal.  Neck: Normal range of motion. Neck supple.  Cardiovascular: Normal rate and regular rhythm.   Pulmonary/Chest: Effort normal and breath sounds normal.  Abdominal: Soft. Bowel sounds are normal.  Musculoskeletal: Normal range of motion.  Neurological: She is alert and oriented to person, place, and time.    ED Course  Procedures (including critical care time) Labs Review Labs Reviewed - No data to display Imaging Review Dg Chest 2 View  08/29/2013   CLINICAL DATA:  Cough and fever  EXAM: CHEST  2 VIEW  COMPARISON:  None.  FINDINGS: The heart size and mediastinal contours are within normal limits. Both lungs are clear. The visualized skeletal structures are unremarkable.  IMPRESSION: No active cardiopulmonary disease.   Electronically Signed   By: 08/31/2013 M.D.   On: 08/29/2013 19:56     EKG Interpretation None      MDM   Final diagnoses:  Strep pharyngitis    Pt given bicillin here and told to stop clinda;no pneumonia noted on x-ray. Pt feeling better after the hydration    08/31/2013, NP 08/29/13 2104

## 2013-08-29 NOTE — ED Notes (Signed)
Pos for strep throat yesterday with PCP-rx clindamycin-new s/s of cough, dizziness started last night

## 2013-08-29 NOTE — Discharge Instructions (Signed)
Pharyngitis °Pharyngitis is redness, pain, and swelling (inflammation) of your pharynx.  °CAUSES  °Pharyngitis is usually caused by infection. Most of the time, these infections are from viruses (viral) and are part of a cold. However, sometimes pharyngitis is caused by bacteria (bacterial). Pharyngitis can also be caused by allergies. Viral pharyngitis may be spread from person to person by coughing, sneezing, and personal items or utensils (cups, forks, spoons, toothbrushes). Bacterial pharyngitis may be spread from person to person by more intimate contact, such as kissing.  °SIGNS AND SYMPTOMS  °Symptoms of pharyngitis include:   °· Sore throat.   °· Tiredness (fatigue).   °· Low-grade fever.   °· Headache. °· Joint pain and muscle aches. °· Skin rashes. °· Swollen lymph nodes. °· Plaque-like film on throat or tonsils (often seen with bacterial pharyngitis). °DIAGNOSIS  °Your health care provider will ask you questions about your illness and your symptoms. Your medical history, along with a physical exam, is often all that is needed to diagnose pharyngitis. Sometimes, a rapid strep test is done. Other lab tests may also be done, depending on the suspected cause.  °TREATMENT  °Viral pharyngitis will usually get better in 3 4 days without the use of medicine. Bacterial pharyngitis is treated with medicines that kill germs (antibiotics).  °HOME CARE INSTRUCTIONS  °· Drink enough water and fluids to keep your urine clear or pale yellow.   °· Only take over-the-counter or prescription medicines as directed by your health care provider:   °· If you are prescribed antibiotics, make sure you finish them even if you start to feel better.   °· Do not take aspirin.   °· Get lots of rest.   °· Gargle with 8 oz of salt water (½ tsp of salt per 1 qt of water) as often as every 1 2 hours to soothe your throat.   °· Throat lozenges (if you are not at risk for choking) or sprays may be used to soothe your throat. °SEEK MEDICAL  CARE IF:  °· You have large, tender lumps in your neck. °· You have a rash. °· You cough up green, yellow-brown, or bloody spit. °SEEK IMMEDIATE MEDICAL CARE IF:  °· Your neck becomes stiff. °· You drool or are unable to swallow liquids. °· You vomit or are unable to keep medicines or liquids down. °· You have severe pain that does not go away with the use of recommended medicines. °· You have trouble breathing (not caused by a stuffy nose). °MAKE SURE YOU:  °· Understand these instructions. °· Will watch your condition. °· Will get help right away if you are not doing well or get worse. °Document Released: 05/24/2005 Document Revised: 03/14/2013 Document Reviewed: 01/29/2013 °[employer]® Patient Information ©2014 [employer]. ° °

## 2013-09-04 ENCOUNTER — Institutional Professional Consult (permissible substitution): Payer: Managed Care, Other (non HMO) | Admitting: Critical Care Medicine

## 2013-09-04 ENCOUNTER — Encounter (HOSPITAL_COMMUNITY): Payer: Managed Care, Other (non HMO)

## 2013-09-06 ENCOUNTER — Ambulatory Visit (HOSPITAL_COMMUNITY)
Admission: RE | Admit: 2013-09-06 | Discharge: 2013-09-06 | Disposition: A | Discharge status: Home / Self Care | Payer: Managed Care, Other (non HMO) | Source: Ambulatory Visit | Attending: Pulmonary Disease | Admitting: Pulmonary Disease

## 2013-09-06 DIAGNOSIS — R059 Cough, unspecified: Secondary | ICD-10-CM | POA: Insufficient documentation

## 2013-09-06 DIAGNOSIS — R0989 Other specified symptoms and signs involving the circulatory and respiratory systems: Principal | ICD-10-CM | POA: Insufficient documentation

## 2013-09-06 DIAGNOSIS — R05 Cough: Secondary | ICD-10-CM | POA: Insufficient documentation

## 2013-09-06 DIAGNOSIS — R0609 Other forms of dyspnea: Secondary | ICD-10-CM | POA: Insufficient documentation

## 2013-09-06 DIAGNOSIS — J90 Pleural effusion, not elsewhere classified: Secondary | ICD-10-CM

## 2013-09-06 LAB — PULMONARY FUNCTION TEST
DL/VA % PRED: 116 %
DL/VA: 5.44 ml/min/mmHg/L
DLCO UNC: 21.78 ml/min/mmHg
DLCO unc % pred: 95 %
FEF 25-75 Post: 2.42 L/sec
FEF 25-75 Pre: 2.69 L/sec
FEF2575-%Change-Post: -9 %
FEF2575-%PRED-PRE: 106 %
FEF2575-%Pred-Post: 95 %
FEV1-%Change-Post: 0 %
FEV1-%PRED-PRE: 94 %
FEV1-%Pred-Post: 94 %
FEV1-POST: 2.2 L
FEV1-Pre: 2.2 L
FEV1FVC-%Change-Post: -3 %
FEV1FVC-%PRED-PRE: 103 %
FEV6-%Change-Post: 5 %
FEV6-%PRED-POST: 95 %
FEV6-%Pred-Pre: 90 %
FEV6-Post: 2.67 L
FEV6-Pre: 2.53 L
FEV6FVC-%Pred-Post: 102 %
FEV6FVC-%Pred-Pre: 102 %
FVC-%CHANGE-POST: 3 %
FVC-%Pred-Post: 93 %
FVC-%Pred-Pre: 90 %
FVC-Post: 2.67 L
FVC-Pre: 2.59 L
POST FEV1/FVC RATIO: 82 %
POST FEV6/FVC RATIO: 100 %
PRE FEV1/FVC RATIO: 85 %
Pre FEV6/FVC Ratio: 100 %
RV % pred: 84 %
RV: 1.41 L
TLC % pred: 82 %
TLC: 4.05 L

## 2013-09-06 MED ORDER — ALBUTEROL SULFATE (2.5 MG/3ML) 0.083% IN NEBU
2.5000 mg | INHALATION_SOLUTION | Freq: Once | RESPIRATORY_TRACT | Status: AC
  Administered 2013-09-06: 2.5 mg via RESPIRATORY_TRACT

## 2013-09-07 ENCOUNTER — Encounter (HOSPITAL_COMMUNITY)
Admission: RE | Admit: 2013-09-07 | Discharge: 2013-09-07 | Disposition: A | Discharge status: Home / Self Care | Payer: Managed Care, Other (non HMO) | Source: Ambulatory Visit | Attending: Rheumatology | Admitting: Rheumatology

## 2013-09-07 ENCOUNTER — Encounter (HOSPITAL_COMMUNITY): Payer: Managed Care, Other (non HMO)

## 2013-09-07 DIAGNOSIS — M069 Rheumatoid arthritis, unspecified: Secondary | ICD-10-CM | POA: Insufficient documentation

## 2013-09-07 MED ORDER — SODIUM CHLORIDE 0.9 % IV SOLN
1000.0000 mg | INTRAVENOUS | Status: DC
  Administered 2013-09-07: 1000 mg via INTRAVENOUS
  Filled 2013-09-07: qty 40

## 2013-09-07 MED ORDER — SODIUM CHLORIDE 0.9 % IV SOLN
INTRAVENOUS | Status: DC
  Administered 2013-09-07: 250 mL via INTRAVENOUS

## 2013-09-08 ENCOUNTER — Encounter: Payer: Self-pay | Admitting: Pulmonary Disease

## 2013-09-10 ENCOUNTER — Telehealth: Payer: Self-pay | Admitting: Pulmonary Disease

## 2013-09-10 NOTE — Telephone Encounter (Signed)
Returning call.

## 2013-09-10 NOTE — Telephone Encounter (Signed)
lmtcb X2 to relay PFT results.

## 2013-09-10 NOTE — Telephone Encounter (Signed)
Pt is aware of normal PFT results.

## 2013-09-10 NOTE — Progress Notes (Signed)
lmtcb X1 

## 2013-10-02 ENCOUNTER — Ambulatory Visit: Payer: Managed Care, Other (non HMO) | Admitting: Pulmonary Disease

## 2013-10-02 ENCOUNTER — Other Ambulatory Visit: Payer: Self-pay | Admitting: Specialist

## 2013-10-03 ENCOUNTER — Other Ambulatory Visit: Payer: Self-pay | Admitting: Specialist

## 2013-10-03 DIAGNOSIS — M7918 Myalgia, other site: Secondary | ICD-10-CM

## 2013-10-03 DIAGNOSIS — R609 Edema, unspecified: Secondary | ICD-10-CM

## 2013-10-05 ENCOUNTER — Encounter (HOSPITAL_COMMUNITY)
Admission: RE | Admit: 2013-10-05 | Discharge: 2013-10-05 | Disposition: A | Discharge status: Home / Self Care | Payer: Managed Care, Other (non HMO) | Source: Ambulatory Visit | Attending: Rheumatology | Admitting: Rheumatology

## 2013-10-05 DIAGNOSIS — M069 Rheumatoid arthritis, unspecified: Secondary | ICD-10-CM | POA: Insufficient documentation

## 2013-10-05 MED ORDER — SODIUM CHLORIDE 0.9 % IV SOLN
1000.0000 mg | INTRAVENOUS | Status: DC
  Administered 2013-10-05: 1000 mg via INTRAVENOUS
  Filled 2013-10-05: qty 40

## 2013-10-05 MED ORDER — SODIUM CHLORIDE 0.9 % IV SOLN
INTRAVENOUS | Status: DC
  Administered 2013-10-05: 12:00:00 via INTRAVENOUS

## 2013-10-07 ENCOUNTER — Ambulatory Visit
Admission: RE | Admit: 2013-10-07 | Discharge: 2013-10-07 | Disposition: A | Discharge status: Home / Self Care | Payer: Managed Care, Other (non HMO) | Source: Ambulatory Visit | Attending: Specialist | Admitting: Specialist

## 2013-10-07 DIAGNOSIS — M7918 Myalgia, other site: Secondary | ICD-10-CM

## 2013-10-07 DIAGNOSIS — R609 Edema, unspecified: Secondary | ICD-10-CM

## 2013-10-23 ENCOUNTER — Emergency Department (HOSPITAL_COMMUNITY)
Admission: EM | Admit: 2013-10-23 | Discharge: 2013-10-23 | Disposition: A | Discharge status: Home / Self Care | Payer: Managed Care, Other (non HMO) | Attending: Emergency Medicine | Admitting: Emergency Medicine

## 2013-10-23 ENCOUNTER — Other Ambulatory Visit: Payer: Self-pay

## 2013-10-23 ENCOUNTER — Encounter (HOSPITAL_COMMUNITY): Payer: Self-pay | Admitting: Emergency Medicine

## 2013-10-23 DIAGNOSIS — M25559 Pain in unspecified hip: Secondary | ICD-10-CM | POA: Insufficient documentation

## 2013-10-23 DIAGNOSIS — F329 Major depressive disorder, single episode, unspecified: Secondary | ICD-10-CM | POA: Insufficient documentation

## 2013-10-23 DIAGNOSIS — IMO0002 Reserved for concepts with insufficient information to code with codable children: Secondary | ICD-10-CM | POA: Insufficient documentation

## 2013-10-23 DIAGNOSIS — Z792 Long term (current) use of antibiotics: Secondary | ICD-10-CM | POA: Insufficient documentation

## 2013-10-23 DIAGNOSIS — F3289 Other specified depressive episodes: Secondary | ICD-10-CM | POA: Insufficient documentation

## 2013-10-23 DIAGNOSIS — Z8619 Personal history of other infectious and parasitic diseases: Secondary | ICD-10-CM | POA: Insufficient documentation

## 2013-10-23 DIAGNOSIS — Z79899 Other long term (current) drug therapy: Secondary | ICD-10-CM | POA: Insufficient documentation

## 2013-10-23 DIAGNOSIS — M069 Rheumatoid arthritis, unspecified: Secondary | ICD-10-CM | POA: Insufficient documentation

## 2013-10-23 DIAGNOSIS — IMO0001 Reserved for inherently not codable concepts without codable children: Secondary | ICD-10-CM | POA: Insufficient documentation

## 2013-10-23 DIAGNOSIS — Z8679 Personal history of other diseases of the circulatory system: Secondary | ICD-10-CM | POA: Insufficient documentation

## 2013-10-23 DIAGNOSIS — M79652 Pain in left thigh: Secondary | ICD-10-CM

## 2013-10-23 NOTE — ED Notes (Signed)
Pt alert, arrives from home, c/o "spider bite" to left outer thigh, onset was this am, area raised, reddened, non open, resp even unlabored, skin pwd

## 2013-10-23 NOTE — ED Provider Notes (Signed)
CSN: [id number]     Arrival date & time 10/23/13  1019 History   First MD Initiated Contact with Patient 10/23/13 1025     Chief Complaint  Patient presents with  . Rash     (Consider location/radiation/quality/duration/timing/severity/associated sxs/prior Treatment) HPI Comments: 48 year old obese female with a past medical history of rheumatoid arthritis, depression, fibromyalgia and HSV 1 presents to the emergency department with concerns of a possible spider bite. Patient states yesterday when she was standing in line at the grocery store she felt a pinch on the outside of her left thigh. Throughout the night and into today she states the area started to feel hot and was very painful to touch and she thought it was turning red. Denies fever, chills, nausea or vomiting.  Patient is a 48 y.o. female presenting with rash. The history is provided by the patient.  Rash   Past Medical History  Diagnosis Date  . Rheumatoid arthritis(714.0)   . Depression   . Morbid obesity   . HSV-1 infection   . Diastolic dysfunction   . Fibromyalgia    Past Surgical History  Procedure Laterality Date  . Cesarean section    . Tubal ligation    . Laparoscopic gastric sleeve resection  11/21/12    [facility]   Family History  Problem Relation Age of Onset  . Hyperlipidemia Mother   . Sarcoidosis Father    History  Substance Use Topics  . Smoking status: Never Smoker   . Smokeless tobacco: Never Used  . Alcohol Use: No   OB History   Grav Para Term Preterm Abortions TAB SAB Ect Mult Living                 Review of Systems  Skin: Positive for color change.  All other systems reviewed and are negative.     Allergies  Flu virus vaccine  Home Medications   Prior to Admission medications   Medication Sig Start Date End Date Taking? Authorizing Provider  Abatacept (ORENCIA IV) Inject 1,000 mg into the vein every 28 (twenty-eight) days.     Historical Provider, MD   Acyclovir-Hydrocortisone (XERESE) 5-1 % CREA APPLY TO FEVER BLISTER 5 TIMES PER DAY AS NEEDED 05/02/13   [name], MD  benzonatate (TESSALON) 100 MG capsule Take 100 mg by mouth 3 (three) times daily as needed. 03/01/13   Loren Racer, MD  cetirizine (ZYRTEC) 10 MG tablet Take 1 tablet (10 mg total) by mouth daily. 08/27/13 08/28/14  [name], MD  clindamycin (CLINDAGEL) 1 % gel Apply topically 2 times daily.    Historical Provider, MD  cyclobenzaprine (FLEXERIL) 10 MG tablet Take 10 mg by mouth 3 (three) times daily as needed for muscle spasms.    Historical Provider, MD  diltiazem (CARDIZEM CD) 180 MG 24 hr capsule Take 1 capsule (180 mg total) by mouth daily. 05/18/13   Quintella Reichert, MD  DULoxetine (CYMBALTA) 60 MG capsule Take 1 capsule (60 mg total) by mouth daily. 07/27/13   [name], MD  EPINEPHrine (EPIPEN) 0.3 mg/0.3 mL DEVI Inject into right thigh for severe allergic reaction and then call 911 09/04/12   [name], MD  famciclovir (FAMVIR) 500 MG tablet Take 500 mg by mouth 3 (three) times daily as needed. Take three tablets at onset of fever blisters x 1 day    Historical Provider, MD  fluticasone (FLONASE) 50 MCG/ACT nasal spray Place 2 sprays into the nose as needed for  allergies.  02/19/13   Historical Provider, MD  gabapentin (NEURONTIN) 100 MG capsule Take 1 capsule by mouth 3 (three) times daily.    Historical Provider, MD  HYDROcodone-acetaminophen (NORCO/VICODIN) 5-325 MG per tablet Take 1 tablet by mouth every 6 (six) hours as needed for pain. 10/22/12   Carlyle Dolly, PA-C  hydrOXYzine (VISTARIL) 25 MG capsule Take 1 capsule BID prn for increased anxiety 07/27/13 07/27/14  [name], MD  lidocaine (LIDODERM) 5 % as needed. 06/04/13   Historical Provider, MD  methotrexate 25 MG/ML injection Inject 1 mL into the muscle once a week. 06/21/13   Historical Provider, MD  omeprazole (PRILOSEC) 20 MG capsule Take 20 mg by mouth daily.    Historical  Provider, MD  predniSONE (DELTASONE) 5 MG tablet Take 5 mg by mouth as needed. As directed    Historical Provider, MD  TL GARD RX 2.2-25-1 MG TABS  07/06/13   Historical Provider, MD  VOLTAREN 1 % GEL Apply 2 g topically daily as needed.  05/02/13   Historical Provider, MD   BP 137/81  Pulse 72  Temp(Src) 98 F (36.7 C) (Oral)  Resp 16  Wt 239 lb (108.41 kg)  SpO2 99% Physical Exam  Nursing note and vitals reviewed. Constitutional: She is oriented to person, place, and time. She appears well-developed and well-nourished. No distress.  Obese.  HENT:  Head: Normocephalic and atraumatic.  Mouth/Throat: Oropharynx is clear and moist.  Eyes: Conjunctivae are normal.  Neck: Normal range of motion. Neck supple.  Cardiovascular: Normal rate, regular rhythm and normal heart sounds.   Pulmonary/Chest: Effort normal and breath sounds normal.  Musculoskeletal: Normal range of motion. She exhibits no edema.  Normal gait.  Neurological: She is alert and oriented to person, place, and time.  Skin: Skin is warm and dry. She is not diaphoretic.  Tenderness to the skin at the middle aspect of lateral left thigh without edema, erythema, abscess, rash or warmth.  Psychiatric: She has a normal mood and affect. Her behavior is normal.    ED Course  Procedures (including critical care time) Labs Review Labs Reviewed - No data to display  Imaging Review No results found.   EKG Interpretation None      MDM   Final diagnoses:  Left thigh pain    Patient presenting with a bite sensation to the left thigh. She is well appearing and in no apparent distress, afebrile, vital signs stable. No rash, abscess, erythema, edema or warmth noted. Advised warm compresses and followup with PCP. Stable for discharge. Return precautions given. Patient states understanding of treatment care plan and is agreeable.   Trevor Mace, PA-C 10/23/13 1104

## 2013-10-23 NOTE — Discharge Instructions (Signed)
Apply warm compresses to the area four times daily for 15 minutes at a time. Follow up with your primary care doctor.  Pain of Unknown Etiology (Pain Without a Known Cause) You have come to your caregiver because of pain. Pain can occur in any part of the body. Often there is not a definite cause. If your laboratory (blood or urine) work was normal and X-rays or other studies were normal, your caregiver may treat you without knowing the cause of the pain. An example of this is the headache. Most headaches are diagnosed by taking a history. This means your caregiver asks you questions about your headaches. Your caregiver determines a treatment based on your answers. Usually testing done for headaches is normal. Often testing is not done unless there is no response to medications. Regardless of where your pain is located today, you can be given medications to make you comfortable. If no physical cause of pain can be found, most cases of pain will gradually leave as suddenly as they came.  If you have a painful condition and no reason can be found for the pain, it is important that you follow up with your caregiver. If the pain becomes worse or does not go away, it may be necessary to repeat tests and look further for a possible cause.  Only take over-the-counter or prescription medicines for pain, discomfort, or fever as directed by your caregiver.  For the protection of your privacy, test results cannot be given over the phone. Make sure you receive the results of your test. Ask how these results are to be obtained if you have not been informed. It is your responsibility to obtain your test results.  You may continue all activities unless the activities cause more pain. When the pain lessens, it is important to gradually resume normal activities. Resume activities by beginning slowly and gradually increasing the intensity and duration of the activities or exercise. During periods of severe pain, bed rest  may be helpful. Lie or sit in any position that is comfortable.  Ice used for acute (sudden) conditions may be effective. Use a large plastic bag filled with ice and wrapped in a towel. This may provide pain relief.  See your caregiver for continued problems. Your caregiver can help or refer you for exercises or physical therapy if necessary. If you were given medications for your condition, do not drive, operate machinery or power tools, or sign legal documents for 24 hours. Do not drink alcohol, take sleeping pills, or take other medications that may interfere with treatment. See your caregiver immediately if you have pain that is becoming worse and not relieved by medications. Document Released: 02/16/2001 Document Revised: 03/14/2013 Document Reviewed: 05/24/2005 [facility] Patient Information 2014 [place], [place].

## 2013-10-24 ENCOUNTER — Emergency Department (HOSPITAL_BASED_OUTPATIENT_CLINIC_OR_DEPARTMENT_OTHER)
Admission: EM | Admit: 2013-10-24 | Discharge: 2013-10-25 | Disposition: A | Discharge status: Home / Self Care | Payer: Managed Care, Other (non HMO) | Attending: Emergency Medicine | Admitting: Emergency Medicine

## 2013-10-24 ENCOUNTER — Encounter (HOSPITAL_BASED_OUTPATIENT_CLINIC_OR_DEPARTMENT_OTHER): Payer: Self-pay | Admitting: Emergency Medicine

## 2013-10-24 DIAGNOSIS — Y929 Unspecified place or not applicable: Secondary | ICD-10-CM | POA: Insufficient documentation

## 2013-10-24 DIAGNOSIS — W57XXXA Bitten or stung by nonvenomous insect and other nonvenomous arthropods, initial encounter: Secondary | ICD-10-CM

## 2013-10-24 DIAGNOSIS — Z792 Long term (current) use of antibiotics: Secondary | ICD-10-CM | POA: Insufficient documentation

## 2013-10-24 DIAGNOSIS — Z8619 Personal history of other infectious and parasitic diseases: Secondary | ICD-10-CM | POA: Insufficient documentation

## 2013-10-24 DIAGNOSIS — Y939 Activity, unspecified: Secondary | ICD-10-CM | POA: Insufficient documentation

## 2013-10-24 DIAGNOSIS — F3289 Other specified depressive episodes: Secondary | ICD-10-CM | POA: Insufficient documentation

## 2013-10-24 DIAGNOSIS — F329 Major depressive disorder, single episode, unspecified: Secondary | ICD-10-CM | POA: Insufficient documentation

## 2013-10-24 DIAGNOSIS — S90569A Insect bite (nonvenomous), unspecified ankle, initial encounter: Secondary | ICD-10-CM | POA: Insufficient documentation

## 2013-10-24 DIAGNOSIS — M069 Rheumatoid arthritis, unspecified: Secondary | ICD-10-CM | POA: Insufficient documentation

## 2013-10-24 DIAGNOSIS — Z8679 Personal history of other diseases of the circulatory system: Secondary | ICD-10-CM | POA: Insufficient documentation

## 2013-10-24 DIAGNOSIS — IMO0001 Reserved for inherently not codable concepts without codable children: Secondary | ICD-10-CM | POA: Insufficient documentation

## 2013-10-24 DIAGNOSIS — Z79899 Other long term (current) drug therapy: Secondary | ICD-10-CM | POA: Insufficient documentation

## 2013-10-24 NOTE — ED Notes (Signed)
Pt c/o insect bite to left thigh x 2 days, seen at [facility] ed for same last night

## 2013-10-24 NOTE — ED Provider Notes (Signed)
Medical screening examination/treatment/procedure(s) were performed by non-physician practitioner and as supervising physician I was immediately available for consultation/collaboration.     Geoffery Lyons, MD 10/24/13 [phone]

## 2013-10-25 MED ORDER — DOXYCYCLINE HYCLATE 100 MG PO TABS
100.0000 mg | ORAL_TABLET | Freq: Once | ORAL | Status: AC
  Administered 2013-10-25: 100 mg via ORAL
  Filled 2013-10-25: qty 1

## 2013-10-25 MED ORDER — DOXYCYCLINE HYCLATE 100 MG PO CAPS: 100.0000 mg | ORAL_CAPSULE | Freq: Two times a day (BID) | ORAL | Status: DC

## 2013-10-25 NOTE — ED Provider Notes (Signed)
CSN: [id number]     Arrival date & time 10/24/13  2250 History   First MD Initiated Contact with Patient 10/25/13 [phone]     Chief Complaint  Patient presents with  . Insect Bite     (Consider location/radiation/quality/duration/timing/severity/associated sxs/prior Treatment) HPI This is a 48 year old female who states she was bitten by an unknown insect or arthropod to the left lateral 53 days ago. Since then she has developed warmth and slight erythema and moderate tenderness around the site. She is also had some itching in her ankles. She denies other systemic symptoms such as fever, chills, nausea or vomiting.  Past Medical History  Diagnosis Date  . Rheumatoid arthritis(714.0)   . Depression   . Morbid obesity   . HSV-1 infection   . Diastolic dysfunction   . Fibromyalgia    Past Surgical History  Procedure Laterality Date  . Cesarean section    . Tubal ligation    . Laparoscopic gastric sleeve resection  11/21/12    [facility]   Family History  Problem Relation Age of Onset  . Hyperlipidemia Mother   . Sarcoidosis Father    History  Substance Use Topics  . Smoking status: Never Smoker   . Smokeless tobacco: Never Used  . Alcohol Use: No   OB History   Grav Para Term Preterm Abortions TAB SAB Ect Mult Living                 Review of Systems  All other systems reviewed and are negative.   Allergies  Flu virus vaccine  Home Medications   Prior to Admission medications   Medication Sig Start Date End Date Taking? Authorizing Provider  Abatacept (ORENCIA IV) Inject 1,000 mg into the vein every 28 (twenty-eight) days.     Historical Provider, MD  Acyclovir-Hydrocortisone (XERESE) 5-1 % CREA APPLY TO FEVER BLISTER 5 TIMES PER DAY AS NEEDED 05/02/13   [name], MD  benzonatate (TESSALON) 100 MG capsule Take 100 mg by mouth 3 (three) times daily as needed. 03/01/13   Loren Racer, MD  cetirizine (ZYRTEC) 10 MG tablet Take 1 tablet (10 mg total) by mouth  daily. 08/27/13 08/28/14  [name], MD  clindamycin (CLINDAGEL) 1 % gel Apply topically 2 times daily.    Historical Provider, MD  cyclobenzaprine (FLEXERIL) 10 MG tablet Take 10 mg by mouth 3 (three) times daily as needed for muscle spasms.    Historical Provider, MD  diltiazem (CARDIZEM CD) 180 MG 24 hr capsule Take 1 capsule (180 mg total) by mouth daily. 05/18/13   Quintella Reichert, MD  DULoxetine (CYMBALTA) 60 MG capsule Take 1 capsule (60 mg total) by mouth daily. 07/27/13   [name], MD  EPINEPHrine (EPIPEN) 0.3 mg/0.3 mL DEVI Inject into right thigh for severe allergic reaction and then call 911 09/04/12   [name], MD  famciclovir (FAMVIR) 500 MG tablet Take 500 mg by mouth 3 (three) times daily as needed. Take three tablets at onset of fever blisters x 1 day    Historical Provider, MD  fluticasone (FLONASE) 50 MCG/ACT nasal spray Place 2 sprays into the nose as needed for allergies.  02/19/13   Historical Provider, MD  gabapentin (NEURONTIN) 100 MG capsule Take 1 capsule by mouth 3 (three) times daily.    Historical Provider, MD  HYDROcodone-acetaminophen (NORCO/VICODIN) 5-325 MG per tablet Take 1 tablet by mouth every 6 (six) hours as needed for pain. 10/22/12   Jamesetta Orleans  Lawyer, PA-C  hydrOXYzine (VISTARIL) 25 MG capsule Take 1 capsule BID prn for increased anxiety 07/27/13 07/27/14  [name], MD  lidocaine (LIDODERM) 5 % as needed. 06/04/13   Historical Provider, MD  methotrexate 25 MG/ML injection Inject 1 mL into the muscle once a week. 06/21/13   Historical Provider, MD  omeprazole (PRILOSEC) 20 MG capsule Take 20 mg by mouth daily.    Historical Provider, MD  predniSONE (DELTASONE) 5 MG tablet Take 5 mg by mouth as needed. As directed    Historical Provider, MD  TL GARD RX 2.2-25-1 MG TABS  07/06/13   Historical Provider, MD  VOLTAREN 1 % GEL Apply 2 g topically daily as needed.  05/02/13   Historical Provider, MD   BP 146/80  Pulse 92  Temp(Src) 98.2 F (36.8 C)  (Oral)  Resp 18  Wt 239 lb (108.41 kg)  SpO2 97%  Physical Exam General: Well-developed, well-nourished female in no acute distress; appearance consistent with age of record HENT: normocephalic; atraumatic Eyes: pupils equal, round and reactive to light; extraocular muscles intact Neck: supple Heart: regular rate and rhythm Lungs: clear to auscultation bilaterally Abdomen: soft; nondistended; nontender Extremities: No deformity; full range of motion Neurologic: Awake, alert and oriented; motor function intact in all extremities and symmetric; no facial droop Skin: Warm and dry; small region of slight erythema and warmth with tenderness to left lateral thigh Psychiatric: Normal mood and affect    ED Course  Procedures (including critical care time)   MDM  We'll treat with doxycycline for possible early cellulitis.    Hanley Seamen, MD 10/25/13 [phone]

## 2013-11-08 ENCOUNTER — Other Ambulatory Visit (HOSPITAL_COMMUNITY): Payer: Self-pay | Admitting: *Deleted

## 2013-11-08 ENCOUNTER — Other Ambulatory Visit: Payer: Self-pay | Admitting: Family Medicine

## 2013-11-09 ENCOUNTER — Encounter (HOSPITAL_COMMUNITY): Payer: Managed Care, Other (non HMO)

## 2013-11-09 ENCOUNTER — Encounter (HOSPITAL_COMMUNITY)
Admission: RE | Admit: 2013-11-09 | Discharge: 2013-11-09 | Disposition: A | Discharge status: Home / Self Care | Payer: Managed Care, Other (non HMO) | Source: Ambulatory Visit | Attending: Rheumatology | Admitting: Rheumatology

## 2013-11-09 DIAGNOSIS — M069 Rheumatoid arthritis, unspecified: Secondary | ICD-10-CM | POA: Insufficient documentation

## 2013-11-09 MED ORDER — SODIUM CHLORIDE 0.9 % IV SOLN
INTRAVENOUS | Status: DC
  Administered 2013-11-09: 14:00:00 via INTRAVENOUS

## 2013-11-09 MED ORDER — ABATACEPT 250 MG IV SOLR
1000.0000 mg | INTRAVENOUS | Status: DC
  Administered 2013-11-09: 1000 mg via INTRAVENOUS
  Filled 2013-11-09: qty 40

## 2013-11-15 ENCOUNTER — Encounter (HOSPITAL_COMMUNITY): Payer: Managed Care, Other (non HMO)

## 2013-11-21 ENCOUNTER — Other Ambulatory Visit: Payer: Self-pay | Admitting: *Deleted

## 2013-11-21 DIAGNOSIS — F411 Generalized anxiety disorder: Secondary | ICD-10-CM

## 2013-11-21 MED ORDER — HYDROXYZINE PAMOATE 25 MG PO CAPS
ORAL_CAPSULE | ORAL | Status: DC
Start: 2013-11-21 — End: 2014-01-03

## 2013-11-28 ENCOUNTER — Encounter: Payer: Self-pay | Admitting: *Deleted

## 2013-11-28 ENCOUNTER — Telehealth: Payer: Self-pay | Admitting: Pulmonary Disease

## 2013-11-28 NOTE — Telephone Encounter (Signed)
Spoke with the pt  She states that her she just learned that her father was dxed four yrs ago with Pleural Mesothelioma  She states that he is in the end stages now  She is asking is there is anything further to be done for her b/c of this  Also wants to know if BQ wants to see her for appt  Pt aware BQ out of the office this wk  Please advise thanks!

## 2013-11-30 ENCOUNTER — Other Ambulatory Visit: Payer: Self-pay | Admitting: Family Medicine

## 2013-12-03 NOTE — Telephone Encounter (Signed)
If there is something sooner then fit her in, but she DOES NOT need to be overbooked for this

## 2013-12-03 NOTE — Telephone Encounter (Signed)
Spoke with the pt and notified that we do not have any sooner openings with BQ She will keep planned appt  Nothing further needed

## 2013-12-03 NOTE — Telephone Encounter (Signed)
Pt is asking to speak w/ BQ's nurse regarding the same.  Pt has an appt on 7/1 @ 1:45 PM, but would really like to be seen sooner.  Pt is very concerned & anxious to discuss her father's diagnosis w/ cancer concerning his lungs & how this may affect her in the future.  [name] [name]

## 2013-12-05 ENCOUNTER — Encounter: Payer: Self-pay | Admitting: Pulmonary Disease

## 2013-12-05 ENCOUNTER — Ambulatory Visit: Payer: Managed Care, Other (non HMO) | Admitting: Pulmonary Disease

## 2013-12-05 ENCOUNTER — Ambulatory Visit (INDEPENDENT_AMBULATORY_CARE_PROVIDER_SITE_OTHER): Payer: Managed Care, Other (non HMO) | Admitting: Pulmonary Disease

## 2013-12-05 VITALS — BP 126/68 | HR 101 | Ht 63.0 in | Wt 242.0 lb

## 2013-12-05 DIAGNOSIS — R0602 Shortness of breath: Secondary | ICD-10-CM

## 2013-12-05 DIAGNOSIS — R911 Solitary pulmonary nodule: Secondary | ICD-10-CM

## 2013-12-05 NOTE — Progress Notes (Signed)
Subjective:    Patient ID: [name] [name], female    DOB: 1965-06-14, 48 y.o.   MRN: [id number]  Synopsis: First came to the [facility] in 2000 at 17 for evaluation of dyspnea and a pulmonary nodule which is seen on a CT scan of her heart. The nodule was 5.3 mm and groundglass and located in the right middle lobe. She is a never smoker. She had pulmonary function testing performed which was normal. HPI  12/05/2013> [name] [name] returns to clinic today quite distraught. She has just learned that her father has lung cancer and so she is very worried about her bone pulmonary nodule. Since she is seen in the last time she has not had new symptoms such as cough, weight loss, hemoptysis, or chest pain. She's not had new shortness of breath. Her weight has been about stable since last visit. She says that her father was diagnosed with cancer and he has not been undergoing treatment by his own choice. He was a Veterinary surgeon in shipyards for years and she believes that this may have something to do with her cancer. She has not been smoking since the last visit. She like to see if possible for [place] to order the CT scan sooner than one year.  Past Medical History  Diagnosis Date  . Rheumatoid arthritis(714.0)   . Depression   . Morbid obesity   . HSV-1 infection   . Diastolic dysfunction   . Fibromyalgia      Review of Systems  Constitutional: Negative for fever, chills and fatigue.  HENT: Negative for postnasal drip, rhinorrhea and sinus pressure.   Respiratory: Positive for shortness of breath. Negative for cough and stridor.   Cardiovascular: Negative for chest pain, palpitations and leg swelling.  Psychiatric/Behavioral: Positive for dysphoric mood.       Objective:   Physical Exam Filed Vitals:   12/05/13 1344  BP: 126/68  Pulse: 101  Height: 5\' 3"  (1.6 m)  Weight: 242 lb (109.77 kg)  SpO2: 98%    RA  Gen: Obese but well appearing, no acute distress HEENT: NCAT, PERRL,  EOMi, OP clear, neck supple without masses PULM: CTA B CV: RRR, no mgr, no JVD AB: BS+, soft, nontender, no hsm Ext: warm, no edema, no clubbing, no cyanosis      Assessment & Plan:   Solitary pulmonary nodule She was very concerned about this 5.3 mm right middle lobe nodule because her father recently revealed to her that he has lung cancer.  I have reviewed the images of the nodule again personally in clinic as well as reviewed them with her.  Further, the size of the nodule (small, round, smooth bordered) is all very reassuring that it is not malignant. However, I explained to her that we need to follow this with serial CT scans.  She is particularly worried about mesothelioma because her father was a and she remembers hugging him as a child when he had not changed his clothes after coming home from work.  She was counseled at length today over 25 minutes during which time I showed her her CT images and showed her examples of lung cancer to provide reassurance.  Plan: -Because of her father's history of lung cancer a request a CT scan of her chest at 6 months rather than 12 months -Followup after that visit  SOB (shortness of breath) Pulmonary function testing was within normal, no evidence of underlying lung disease. This is most likely due to obesity  and deconditioning.    Updated Medication List Outpatient Encounter Prescriptions as of 12/05/2013  Medication Sig  . Abatacept (ORENCIA IV) Inject 1,000 mg into the vein every 28 (twenty-eight) days.   . Acyclovir-Hydrocortisone (XERESE) 5-1 % CREA APPLY TO FEVER BLISTER 5 TIMES PER DAY AS NEEDED  . benzonatate (TESSALON) 100 MG capsule Take 100 mg by mouth 3 (three) times daily as needed.  . cetirizine (ZYRTEC) 10 MG tablet Take 1 tablet (10 mg total) by mouth daily.  . clindamycin (CLINDAGEL) 1 % gel Apply topically 2 times daily.  . cyclobenzaprine (FLEXERIL) 10 MG tablet Take 10 mg by mouth 3 (three) times daily as  needed for muscle spasms.  Marland Kitchen diltiazem (CARDIZEM CD) 180 MG 24 hr capsule Take 1 capsule (180 mg total) by mouth daily.  Marland Kitchen doxycycline (VIBRAMYCIN) 100 MG capsule Take 1 capsule (100 mg total) by mouth 2 (two) times daily. One po bid x 7 days  . DULoxetine (CYMBALTA) 60 MG capsule Take 1 capsule (60 mg total) by mouth daily.  Marland Kitchen EPINEPHrine (EPIPEN) 0.3 mg/0.3 mL DEVI Inject into right thigh for severe allergic reaction and then call 911  . famciclovir (FAMVIR) 500 MG tablet Take 500 mg by mouth 3 (three) times daily as needed. Take three tablets at onset of fever blisters x 1 day  . fluticasone (FLONASE) 50 MCG/ACT nasal spray Place 2 sprays into the nose as needed for allergies.   Marland Kitchen gabapentin (NEURONTIN) 100 MG capsule Take 1 capsule by mouth 3 (three) times daily.  Marland Kitchen HYDROcodone-acetaminophen (NORCO/VICODIN) 5-325 MG per tablet Take 1 tablet by mouth every 6 (six) hours as needed for pain.  . hydrOXYzine (VISTARIL) 25 MG capsule Take 1 capsule BID prn for increased anxiety  . lidocaine (LIDODERM) 5 % as needed.  . methotrexate 25 MG/ML injection Inject 1 mL into the muscle once a week.  Marland Kitchen omeprazole (PRILOSEC) 20 MG capsule Take 20 mg by mouth daily.  . predniSONE (DELTASONE) 5 MG tablet Take 5 mg by mouth as needed. As directed  . TL GARD RX 2.2-25-1 MG TABS   . VOLTAREN 1 % GEL Apply 2 g topically daily as needed.

## 2013-12-05 NOTE — Assessment & Plan Note (Signed)
Pulmonary function testing was within normal, no evidence of underlying lung disease. This is most likely due to obesity and deconditioning.

## 2013-12-05 NOTE — Patient Instructions (Signed)
We will order the CT scan of your chest for August and see you after that

## 2013-12-05 NOTE — Assessment & Plan Note (Addendum)
She was very concerned about this 5.3 mm right middle lobe nodule because her father recently revealed to her that he has lung cancer.  I have reviewed the images of the nodule again personally in clinic as well as reviewed them with her.  Further, the size of the nodule (small, round, smooth bordered) is all very reassuring that it is not malignant. However, I explained to her that we need to follow this with serial CT scans.  She is particularly worried about mesothelioma because her father was a Veterinary surgeon and she remembers hugging him as a child when he had not changed his clothes after coming home from work.  She was counseled at length today over 25 minutes during which time I showed her her CT images and showed her examples of lung cancer to provide reassurance.  Plan: -Because of her father's history of lung cancer a request a CT scan of her chest at 6 months rather than 12 months -Followup after that visit

## 2013-12-06 ENCOUNTER — Encounter (HOSPITAL_COMMUNITY)
Admission: RE | Admit: 2013-12-06 | Discharge: 2013-12-06 | Disposition: A | Discharge status: Home / Self Care | Payer: Managed Care, Other (non HMO) | Source: Ambulatory Visit | Attending: Rheumatology | Admitting: Rheumatology

## 2013-12-06 DIAGNOSIS — M069 Rheumatoid arthritis, unspecified: Secondary | ICD-10-CM | POA: Insufficient documentation

## 2013-12-06 MED ORDER — SODIUM CHLORIDE 0.9 % IV SOLN
1000.0000 mg | INTRAVENOUS | Status: DC
  Administered 2013-12-06: 1000 mg via INTRAVENOUS
  Filled 2013-12-06: qty 40

## 2013-12-06 MED ORDER — SODIUM CHLORIDE 0.9 % IV SOLN
INTRAVENOUS | Status: DC
  Administered 2013-12-06: 11:00:00 via INTRAVENOUS

## 2013-12-10 NOTE — Telephone Encounter (Signed)
Could we do a refill on her epipen/wlb

## 2013-12-12 ENCOUNTER — Other Ambulatory Visit: Payer: Self-pay | Admitting: Family Medicine

## 2013-12-12 ENCOUNTER — Telehealth: Payer: Self-pay | Admitting: Family Medicine

## 2013-12-19 NOTE — Telephone Encounter (Signed)
They called from pharmacy downstairs to check on refills for her, she has an appointment on 7.31.15

## 2013-12-24 NOTE — Telephone Encounter (Signed)
Pharmacy downstairs called to check on refills for Famvir 500mg , Xerese 5-1 and epipen 0.3mg 

## 2014-01-03 ENCOUNTER — Encounter: Payer: Self-pay | Admitting: Family Medicine

## 2014-01-03 ENCOUNTER — Ambulatory Visit (INDEPENDENT_AMBULATORY_CARE_PROVIDER_SITE_OTHER): Payer: Managed Care, Other (non HMO) | Admitting: Family Medicine

## 2014-01-03 VITALS — BP 114/80 | HR 54 | Resp 16 | Ht 63.0 in | Wt 244.0 lb

## 2014-01-03 DIAGNOSIS — F411 Generalized anxiety disorder: Secondary | ICD-10-CM

## 2014-01-03 DIAGNOSIS — J3089 Other allergic rhinitis: Secondary | ICD-10-CM

## 2014-01-03 DIAGNOSIS — G43009 Migraine without aura, not intractable, without status migrainosus: Secondary | ICD-10-CM

## 2014-01-03 DIAGNOSIS — B001 Herpesviral vesicular dermatitis: Secondary | ICD-10-CM

## 2014-01-03 DIAGNOSIS — B009 Herpesviral infection, unspecified: Secondary | ICD-10-CM

## 2014-01-03 DIAGNOSIS — Z5189 Encounter for other specified aftercare: Secondary | ICD-10-CM

## 2014-01-03 DIAGNOSIS — T782XXD Anaphylactic shock, unspecified, subsequent encounter: Secondary | ICD-10-CM

## 2014-01-03 DIAGNOSIS — R059 Cough, unspecified: Secondary | ICD-10-CM

## 2014-01-03 DIAGNOSIS — R05 Cough: Secondary | ICD-10-CM

## 2014-01-03 MED ORDER — FLUTICASONE PROPIONATE 50 MCG/ACT NA SUSP
2.0000 | NASAL | Status: DC | PRN
Start: 2014-01-03 — End: 2014-12-28

## 2014-01-03 MED ORDER — BENZONATATE 200 MG PO CAPS
200.0000 mg | ORAL_CAPSULE | Freq: Three times a day (TID) | ORAL | Status: DC | PRN
Start: 2014-01-03 — End: 2014-12-29

## 2014-01-03 MED ORDER — TOPIRAMATE ER 50 MG PO CAP24: 1.0000 | ORAL_CAPSULE | Freq: Every day | ORAL | Status: DC

## 2014-01-03 MED ORDER — BENZONATATE 200 MG PO CAPS: 200.0000 mg | ORAL_CAPSULE | Freq: Three times a day (TID) | ORAL | Status: DC | PRN

## 2014-01-03 MED ORDER — DULOXETINE HCL 60 MG PO CPEP: 60.0000 mg | ORAL_CAPSULE | Freq: Every day | ORAL | Status: DC

## 2014-01-03 MED ORDER — EPINEPHRINE 0.3 MG/0.3ML IJ SOAJ: 0.3000 mg | Freq: Once | INTRAMUSCULAR | Status: DC

## 2014-01-03 MED ORDER — HYDROXYZINE PAMOATE 25 MG PO CAPS: ORAL_CAPSULE | ORAL | Status: DC

## 2014-01-03 MED ORDER — ACYCLOVIR-HYDROCORTISONE 5-1 % EX CREA: 1.0000 "application " | TOPICAL_CREAM | CUTANEOUS | Status: DC

## 2014-01-03 MED ORDER — FAMCICLOVIR 500 MG PO TABS: ORAL_TABLET | ORAL | Status: DC

## 2014-01-03 NOTE — Progress Notes (Signed)
Subjective:    Patient ID: [name] [name], female    DOB: Nov 13, 1965, 48 y.o.   MRN: [id number]  HPI  [name] [name] is here today to follow up on her medications. She is needing to get refills on all her meds. She has been under a lot of stress with having several deaths in her family and would like to get another refill on the Vistaril. She also has a fever blister and would like to a refill on her Acyclovir cream. She is complaining of a cough and would like to get some more Tessalon  pearls.    Review of Systems  Constitutional: Negative for activity change, appetite change and fatigue.  Respiratory: Positive for cough.   Cardiovascular: Negative for chest pain, palpitations and leg swelling.  Psychiatric/Behavioral: Negative for behavioral problems. The patient is not nervous/anxious.   All other systems reviewed and are negative.    Past Medical History  Diagnosis Date  . Rheumatoid arthritis(714.0)   . Depression   . Morbid obesity   . HSV-1 infection   . Diastolic dysfunction   . Fibromyalgia      Past Surgical History  Procedure Laterality Date  . Cesarean section    . Tubal ligation    . Laparoscopic gastric sleeve resection  11/21/12    [facility]     History   Social History Narrative   Marital Status: Married Probation officer)    Children:  Son [name]) Daughter [name])    Pets: None    Living Situation: Lives with husband and children    Occupation: Occupational psychologist Administrator)     Education: Oncologist (Psychology)     Tobacco Use/Exposure:  None    Alcohol Use:  Occasional   Drug Use:  None   Diet:  Regular   Exercise: Walking or Treadmill (2 x week)   Hobbies: Clinical cytogeneticist, [employer].                 Family History  Problem Relation Age of Onset  . Hyperlipidemia Mother   . Sarcoidosis Father   . Lung disease Father     Pleural Mesothelioma     Current Outpatient Prescriptions on File Prior to Visit    Medication Sig Dispense Refill  . Abatacept (ORENCIA IV) Inject 1,000 mg into the vein every 28 (twenty-eight) days.       . cetirizine (ZYRTEC) 10 MG tablet Take 1 tablet (10 mg total) by mouth daily.  100 tablet  3  . clindamycin (CLINDAGEL) 1 % gel Apply topically 2 times daily.      . cyclobenzaprine (FLEXERIL) 10 MG tablet Take 10 mg by mouth 3 (three) times daily as needed for muscle spasms.      Marland Kitchen diltiazem (CARDIZEM CD) 180 MG 24 hr capsule Take 1 capsule (180 mg total) by mouth daily.  90 capsule  3  . doxycycline (VIBRAMYCIN) 100 MG capsule Take 1 capsule (100 mg total) by mouth 2 (two) times daily. One po bid x 7 days  10 capsule  0  . gabapentin (NEURONTIN) 100 MG capsule Take 1 capsule by mouth 3 (three) times daily.      Marland Kitchen HYDROcodone-acetaminophen (NORCO/VICODIN) 5-325 MG per tablet Take 1 tablet by mouth every 6 (six) hours as needed for pain.  15 tablet  0  . lidocaine (LIDODERM) 5 % as needed.      . methotrexate 25 MG/ML injection Inject 1 mL into the muscle once a week.      Marland Kitchen  omeprazole (PRILOSEC) 20 MG capsule Take 20 mg by mouth daily.      . TL GARD RX 2.2-25-1 MG TABS       . VOLTAREN 1 % GEL Apply 2 g topically daily as needed.        No current facility-administered medications on file prior to visit.     Allergies  Allergen Reactions  . Flu Virus Vaccine Anaphylaxis       Objective:   Physical Exam  Constitutional: She appears well-nourished. No distress.  HENT:  Head: Normocephalic.  Nose: Nose normal.  Eyes: No scleral icterus.  Neck: No thyromegaly present.  Cardiovascular: Normal rate, regular rhythm and normal heart sounds.   Pulmonary/Chest: Effort normal and breath sounds normal.  Abdominal: There is no tenderness.  Musculoskeletal: She exhibits no edema and no tenderness.  Neurological: She is alert.  Skin: Skin is warm and dry.  Psychiatric: She has a normal mood and affect. Her behavior is normal. Judgment and thought content normal.       Assessment & Plan:    [name] [name] was seen today for medication management and medical management of chronic issues.  Diagnoses and associated orders for this visit:  Fever blister - famciclovir (FAMVIR) 500 MG tablet; Take three tablets at onset of fever blisters x 1 day - Acyclovir-Hydrocortisone (XERESE) 5-1 % CREA; Apply 1 application topically every 24 hours x 5 doses.  Cough - benzonatate (TESSALON) 200 MG capsule; Take 1 capsule (200 mg total) by mouth 3 (three) times daily as needed for cough.  Anxiety state, unspecified - hydrOXYzine (VISTARIL) 25 MG capsule; Take 1 capsule BID prn for increased anxiety - DULoxetine (CYMBALTA) 60 MG capsule; Take 1 capsule (60 mg total) by mouth daily.  Other allergic rhinitis - fluticasone (FLONASE) 50 MCG/ACT nasal spray; Place 2 sprays into both nostrils as needed for allergies.  Anaphylaxis, subsequent encounter - EPINEPHrine 0.3 mg/0.3 mL IJ SOAJ injection; Inject 0.3 mLs (0.3 mg total) into the muscle once.  Nonintractable migraine, unspecified migraine type - Topiramate ER (TROKENDI XR) 50 MG CP24; Take 1 tablet by mouth daily.   TIME SPENT "FACE TO FACE" WITH PATIENT -  30 MINS

## 2014-01-08 ENCOUNTER — Encounter: Payer: Self-pay | Admitting: *Deleted

## 2014-01-08 ENCOUNTER — Telehealth: Payer: Self-pay | Admitting: Emergency Medicine

## 2014-01-08 NOTE — Telephone Encounter (Signed)
OK,  Please send the following.  To Whom it May Concern:  Ms. Bedore has been followed by our clinic for obesity related restrictive lung disease but she does not have parenchymal lung disease.  Based on this, she is not at excessive risk for lung disease and if Dr. Corliss Skains feels that she would benefit from methotrexate or orencia injections then she should proceed.  Sincerely,   Heber Wahkon MD

## 2014-01-08 NOTE — Telephone Encounter (Signed)
Called spoke with pt. She reports she needs letter stating it is okay for her to still get methotrexate injections and orencia. Needs a letter faxed to [phone]. Pt is scheduled for orencia injection on Friday. Please advise Dr. Kendrick Fries thanks

## 2014-01-08 NOTE — Telephone Encounter (Signed)
[facility] PT line busy Letter has been faxed to # provided

## 2014-01-09 NOTE — Telephone Encounter (Signed)
Pt advised. Jennifer Castillo, CMA  

## 2014-01-11 ENCOUNTER — Encounter (HOSPITAL_COMMUNITY)
Admission: RE | Admit: 2014-01-11 | Discharge: 2014-01-11 | Disposition: A | Discharge status: Home / Self Care | Payer: Managed Care, Other (non HMO) | Source: Ambulatory Visit | Attending: Rheumatology | Admitting: Rheumatology

## 2014-01-11 DIAGNOSIS — M069 Rheumatoid arthritis, unspecified: Secondary | ICD-10-CM | POA: Insufficient documentation

## 2014-01-11 MED ORDER — SODIUM CHLORIDE 0.9 % IV SOLN: INTRAVENOUS | Status: DC

## 2014-01-11 MED ORDER — SODIUM CHLORIDE 0.9 % IV SOLN
1000.0000 mg | INTRAVENOUS | Status: DC
  Administered 2014-01-11: 1000 mg via INTRAVENOUS
  Filled 2014-01-11: qty 40

## 2014-01-21 ENCOUNTER — Ambulatory Visit (INDEPENDENT_AMBULATORY_CARE_PROVIDER_SITE_OTHER)
Admission: RE | Admit: 2014-01-21 | Discharge: 2014-01-21 | Disposition: A | Discharge status: Home / Self Care | Payer: Managed Care, Other (non HMO) | Source: Ambulatory Visit | Attending: Pulmonary Disease | Admitting: Pulmonary Disease

## 2014-01-21 DIAGNOSIS — R911 Solitary pulmonary nodule: Secondary | ICD-10-CM

## 2014-01-22 ENCOUNTER — Encounter (HOSPITAL_BASED_OUTPATIENT_CLINIC_OR_DEPARTMENT_OTHER): Payer: Self-pay | Admitting: Emergency Medicine

## 2014-01-22 ENCOUNTER — Emergency Department (HOSPITAL_BASED_OUTPATIENT_CLINIC_OR_DEPARTMENT_OTHER)
Admission: EM | Admit: 2014-01-22 | Discharge: 2014-01-22 | Disposition: A | Discharge status: Home / Self Care | Payer: Managed Care, Other (non HMO) | Attending: Emergency Medicine | Admitting: Emergency Medicine

## 2014-01-22 ENCOUNTER — Encounter: Payer: Self-pay | Admitting: Pulmonary Disease

## 2014-01-22 ENCOUNTER — Emergency Department (HOSPITAL_BASED_OUTPATIENT_CLINIC_OR_DEPARTMENT_OTHER): Payer: Managed Care, Other (non HMO)

## 2014-01-22 DIAGNOSIS — Z79899 Other long term (current) drug therapy: Secondary | ICD-10-CM | POA: Diagnosis not present

## 2014-01-22 DIAGNOSIS — M069 Rheumatoid arthritis, unspecified: Secondary | ICD-10-CM | POA: Diagnosis not present

## 2014-01-22 DIAGNOSIS — S59909A Unspecified injury of unspecified elbow, initial encounter: Secondary | ICD-10-CM | POA: Diagnosis present

## 2014-01-22 DIAGNOSIS — R296 Repeated falls: Secondary | ICD-10-CM | POA: Diagnosis not present

## 2014-01-22 DIAGNOSIS — F329 Major depressive disorder, single episode, unspecified: Secondary | ICD-10-CM | POA: Insufficient documentation

## 2014-01-22 DIAGNOSIS — S6990XA Unspecified injury of unspecified wrist, hand and finger(s), initial encounter: Secondary | ICD-10-CM

## 2014-01-22 DIAGNOSIS — Z8679 Personal history of other diseases of the circulatory system: Secondary | ICD-10-CM | POA: Diagnosis not present

## 2014-01-22 DIAGNOSIS — Y929 Unspecified place or not applicable: Secondary | ICD-10-CM | POA: Insufficient documentation

## 2014-01-22 DIAGNOSIS — F3289 Other specified depressive episodes: Secondary | ICD-10-CM | POA: Insufficient documentation

## 2014-01-22 DIAGNOSIS — IMO0002 Reserved for concepts with insufficient information to code with codable children: Secondary | ICD-10-CM | POA: Diagnosis not present

## 2014-01-22 DIAGNOSIS — S63509A Unspecified sprain of unspecified wrist, initial encounter: Secondary | ICD-10-CM | POA: Diagnosis not present

## 2014-01-22 DIAGNOSIS — Z8619 Personal history of other infectious and parasitic diseases: Secondary | ICD-10-CM | POA: Insufficient documentation

## 2014-01-22 DIAGNOSIS — Y939 Activity, unspecified: Secondary | ICD-10-CM | POA: Insufficient documentation

## 2014-01-22 DIAGNOSIS — S59919A Unspecified injury of unspecified forearm, initial encounter: Secondary | ICD-10-CM

## 2014-01-22 DIAGNOSIS — Z792 Long term (current) use of antibiotics: Secondary | ICD-10-CM | POA: Diagnosis not present

## 2014-01-22 DIAGNOSIS — S63502A Unspecified sprain of left wrist, initial encounter: Secondary | ICD-10-CM

## 2014-01-22 MED ORDER — ACETAMINOPHEN-CODEINE #3 300-30 MG PO TABS: 1.0000 | ORAL_TABLET | Freq: Three times a day (TID) | ORAL | Status: DC | PRN

## 2014-01-22 NOTE — Discharge Instructions (Signed)
Take Tylenol 3 as directed as needed for severe pain. No driving or operating heavy machinery while taking this drug as it may cause drowsiness. RICE: Routine Care for Injuries The routine care of many injuries includes Rest, Ice, Compression, and Elevation (RICE). HOME CARE INSTRUCTIONS  Rest is needed to allow your body to heal. Routine activities can usually be resumed when comfortable. Injured tendons and bones can take up to 6 weeks to heal. Tendons are the cord-like structures that attach muscle to bone.  Ice following an injury helps keep the swelling down and reduces pain.  Put ice in a plastic bag.  Place a towel between your skin and the bag.  Leave the ice on for 15-20 minutes, 3-4 times a day, or as directed by your health care provider. Do this while awake, for the first 24 to 48 hours. After that, continue as directed by your caregiver.  Compression helps keep swelling down. It also gives support and helps with discomfort. If an elastic bandage has been applied, it should be removed and reapplied every 3 to 4 hours. It should not be applied tightly, but firmly enough to keep swelling down. Watch fingers or toes for swelling, bluish discoloration, coldness, numbness, or excessive pain. If any of these problems occur, remove the bandage and reapply loosely. Contact your caregiver if these problems continue.  Elevation helps reduce swelling and decreases pain. With extremities, such as the arms, hands, legs, and feet, the injured area should be placed near or above the level of the heart, if possible. SEEK IMMEDIATE MEDICAL CARE IF:  You have persistent pain and swelling.  You develop redness, numbness, or unexpected weakness.  Your symptoms are getting worse rather than improving after several days. These symptoms may indicate that further evaluation or further X-rays are needed. Sometimes, X-rays may not show a small broken bone (fracture) until 1 week or 10 days later. Make a  follow-up appointment with your caregiver. Ask when your X-ray results will be ready. Make sure you get your X-ray results. Document Released: 09/05/2000 Document Revised: 05/29/2013 Document Reviewed: 10/23/2010 [facility] Patient Information 2015 [place], [place]. This information is not intended to replace advice given to you by your health care provider. Make sure you discuss any questions you have with your health care provider. Wrist Sprain with Rehab A sprain is an injury in which a ligament that maintains the proper alignment of a joint is partially or completely torn. The ligaments of the wrist are susceptible to sprains. Sprains are classified into three categories. Grade 1 sprains cause pain, but the tendon is not lengthened. Grade 2 sprains include a lengthened ligament because the ligament is stretched or partially ruptured. With grade 2 sprains there is still function, although the function may be diminished. Grade 3 sprains are characterized by a complete tear of the tendon or muscle, and function is usually impaired. SYMPTOMS   Pain tenderness, inflammation, and/or bruising (contusion) of the injury.  A "pop" or tear felt and/or heard at the time of injury.  Decreased wrist function. CAUSES  A wrist sprain occurs when a force is placed on one or more ligaments that is greater than it/they can withstand. Common mechanisms of injury include:  Catching a ball with you hands.  Repetitive and/ or strenuous extension or flexion of the wrist. RISK INCREASES WITH:  Previous wrist injury.  Contact sports (boxing or wrestling).  Activities in which falling is common.  Poor strength and flexibility.  Improperly fitted or padded protective  equipment. PREVENTION  Warm up and stretch properly before activity.  Allow for adequate recovery between workouts.  Maintain physical fitness:  Strength, flexibility, and endurance.  Cardiovascular fitness.  Protect the wrist joint by  limiting its motion with the use of taping, braces, or splints.  Protect the wrist after injury for 6 to 12 months. PROGNOSIS  The prognosis for wrist sprains depends on the degree of injury. Grade 1 sprains require 2 to 6 weeks of treatment. Grade 2 sprains require 6 to 8 weeks of treatment, and grade 3 sprains require up to 12 weeks.  RELATED COMPLICATIONS   Prolonged healing time, if improperly treated or re-injured.  Recurrent symptoms that result in a chronic problem.  Injury to nearby structures (bone, cartilage, nerves, or tendons).  Arthritis of the wrist.  Inability to compete in athletics at a high level.  Wrist stiffness or weakness.  Progression to a complete rupture of the ligament. TREATMENT  Treatment initially involves resting from any activities that aggravate the symptoms, and the use of ice and medications to help reduce pain and inflammation. Your caregiver may recommend immobilizing the wrist for a period of time in order to reduce stress on the ligament and allow for healing. After immobilization it is important to perform strengthening and stretching exercises to help regain strength and a full range of motion. These exercises may be completed at home or with a therapist. Surgery is not usually required for wrist sprains, unless the ligament has been ruptured (grade 3 sprain). MEDICATION   If pain medication is necessary, then nonsteroidal anti-inflammatory medications, such as aspirin and ibuprofen, or other minor pain relievers, such as acetaminophen, are often recommended.  Do not take pain medication for 7 days before surgery.  Prescription pain relievers may be given if deemed necessary by your caregiver. Use only as directed and only as much as you need. HEAT AND COLD  Cold treatment (icing) relieves pain and reduces inflammation. Cold treatment should be applied for 10 to 15 minutes every 2 to 3 hours for inflammation and pain and immediately after any  activity that aggravates your symptoms. Use ice packs or massage the area with a piece of ice (ice massage).  Heat treatment may be used prior to performing the stretching and strengthening activities prescribed by your caregiver, physical therapist, or athletic trainer. Use a heat pack or soak your injury in warm water. SEEK MEDICAL CARE IF:  Treatment seems to offer no benefit, or the condition worsens.  Any medications produce adverse side effects. EXERCISES RANGE OF MOTION (ROM) AND STRETCHING EXERCISES - Wrist Sprain  These exercises may help you when beginning to rehabilitate your injury. Your symptoms may resolve with or without further involvement from your physician, physical therapist or athletic trainer. While completing these exercises, remember:   Restoring tissue flexibility helps normal motion to return to the joints. This allows healthier, less painful movement and activity.  An effective stretch should be held for at least 30 seconds.  A stretch should never be painful. You should only feel a gentle lengthening or release in the stretched tissue. RANGE OF MOTION - Wrist Flexion, Active-Assisted  Extend your right / left elbow with your fingers pointing down.*  Gently pull the back of your hand towards you until you feel a gentle stretch on the top of your forearm.  Hold this position for __________ seconds. Repeat __________ times. Complete this exercise __________ times per day.  *If directed by your physician, physical therapist or athletic  trainer, complete this stretch with your elbow bent rather than extended. RANGE OF MOTION - Wrist Extension, Active-Assisted  Extend your right / left elbow and turn your palm upwards.*  Gently pull your palm/fingertips back so your wrist extends and your fingers point more toward the ground.  You should feel a gentle stretch on the inside of your forearm.  Hold this position for __________ seconds. Repeat __________ times.  Complete this exercise __________ times per day. *If directed by your physician, physical therapist or athletic trainer, complete this stretch with your elbow bent, rather than extended. RANGE OF MOTION - Supination, Active  Stand or sit with your elbows at your side. Bend your right / left elbow to 90 degrees.  Turn your palm upward until you feel a gentle stretch on the inside of your forearm.  Hold this position for __________ seconds. Slowly release and return to the starting position. Repeat __________ times. Complete this stretch __________ times per day.  RANGE OF MOTION - Pronation, Active  Stand or sit with your elbows at your side. Bend your right / left elbow to 90 degrees.  Turn your palm downward until you feel a gentle stretch on the top of your forearm.  Hold this position for __________ seconds. Slowly release and return to the starting position. Repeat __________ times. Complete this stretch __________ times per day.  STRETCH - Wrist Flexion  Place the back of your right / left hand on a tabletop leaving your elbow slightly bent. Your fingers should point away from your body.  Gently press the back of your hand down onto the table by straightening your elbow. You should feel a stretch on the top of your forearm.  Hold this position for __________ seconds. Repeat __________ times. Complete this stretch __________ times per day.  STRETCH - Wrist Extension  Place your right / left fingertips on a tabletop leaving your elbow slightly bent. Your fingers should point backwards.  Gently press your fingers and palm down onto the table by straightening your elbow. You should feel a stretch on the inside of your forearm.  Hold this position for __________ seconds. Repeat __________ times. Complete this stretch __________ times per day.  STRENGTHENING EXERCISES - Wrist Sprain These exercises may help you when beginning to rehabilitate your injury. They may resolve your  symptoms with or without further involvement from your physician, physical therapist or athletic trainer. While completing these exercises, remember:   Muscles can gain both the endurance and the strength needed for everyday activities through controlled exercises.  Complete these exercises as instructed by your physician, physical therapist or athletic trainer. Progress with the resistance and repetition exercises only as your caregiver advises. STRENGTH - Wrist Flexors  Sit with your right / left forearm palm-up and fully supported. Your elbow should be resting below the height of your shoulder. Allow your wrist to extend over the edge of the surface.  Loosely holding a __________ weight or a piece of rubber exercise band/tubing, slowly curl your hand up toward your forearm.  Hold this position for __________ seconds. Slowly lower the wrist back to the starting position in a controlled manner. Repeat __________ times. Complete this exercise __________ times per day.  STRENGTH - Wrist Extensors  Sit with your right / left forearm palm-down and fully supported. Your elbow should be resting below the height of your shoulder. Allow your wrist to extend over the edge of the surface.  Loosely holding a __________ weight or a piece of  rubber exercise band/tubing, slowly curl your hand up toward your forearm.  Hold this position for __________ seconds. Slowly lower the wrist back to the starting position in a controlled manner. Repeat __________ times. Complete this exercise __________ times per day.  STRENGTH - Ulnar Deviators  Stand with a ____________________ weight in your right / left hand, or sit holding on to the rubber exercise band/tubing with your opposite arm supported.  Move your wrist so that your pinkie travels toward your forearm and your thumb moves away from your forearm.  Hold this position for __________ seconds and then slowly lower the wrist back to the starting  position. Repeat __________ times. Complete this exercise __________ times per day STRENGTH - Radial Deviators  Stand with a ____________________ weight in your  right / left hand, or sit holding on to the rubber exercise band/tubing with your arm supported.  Raise your hand upward in front of you or pull up on the rubber tubing.  Hold this position for __________ seconds and then slowly lower the wrist back to the starting position. Repeat __________ times. Complete this exercise __________ times per day. STRENGTH - Forearm Supinators  Sit with your right / left forearm supported on a table, keeping your elbow below shoulder height. Rest your hand over the edge, palm down.  Gently grip a hammer or a soup ladle.  Without moving your elbow, slowly turn your palm and hand upward to a "thumbs-up" position.  Hold this position for __________ seconds. Slowly return to the starting position. Repeat __________ times. Complete this exercise __________ times per day.  STRENGTH - Forearm Pronators  Sit with your right / left forearm supported on a table, keeping your elbow below shoulder height. Rest your hand over the edge, palm up.  Gently grip a hammer or a soup ladle.  Without moving your elbow, slowly turn your palm and hand upward to a "thumbs-up" position.  Hold this position for __________ seconds. Slowly return to the starting position. Repeat __________ times. Complete this exercise __________ times per day.  STRENGTH - Grip  Grasp a tennis ball, a dense sponge, or a large, rolled sock in your hand.  Squeeze as hard as you can without increasing any pain.  Hold this position for __________ seconds. Release your grip slowly. Repeat __________ times. Complete this exercise __________ times per day.  Document Released: 05/24/2005 Document Revised: 08/16/2011 Document Reviewed: 09/05/2008 [facility] Patient Information 2015 [place], [place]. This information is not intended to  replace advice given to you by your health care provider. Make sure you discuss any questions you have with your health care provider.

## 2014-01-22 NOTE — Progress Notes (Signed)
Quick Note:  Pt aware of results and recs. Nothing further needed. ______ 

## 2014-01-22 NOTE — ED Notes (Signed)
Pt says that her right knee and right hip gave out on her, she went to catch herself and she hurt her left wrist.

## 2014-01-22 NOTE — ED Provider Notes (Signed)
CSN: [id number]     Arrival date & time 01/22/14  1828 History   First MD Initiated Contact with Patient 01/22/14 1941     Chief Complaint  Patient presents with  . Fall     (Consider location/radiation/quality/duration/timing/severity/associated sxs/prior Treatment) HPI Comments: Pt is a 48 y/o female with a PMHx of RA, depression and fibromyalgia who presents to the ED complaining of left wrist pain after a fall occuring earlier today. Pt reports her right hip and knee gave out on her causing her to fall. States she tried to catch herself with her left wrist. States she had some slight swelling throughout the day. Pain currently 8/10, worse with certain movements. She has not tried any alleviating factors for his symptoms. States her finger is still slightly numb.  Patient is a 48 y.o. female presenting with fall. The history is provided by the patient.  Fall Associated symptoms include numbness. Pertinent negatives include no fever.    Past Medical History  Diagnosis Date  . Rheumatoid arthritis(714.0)   . Depression   . Morbid obesity   . HSV-1 infection   . Diastolic dysfunction   . Fibromyalgia    Past Surgical History  Procedure Laterality Date  . Cesarean section    . Tubal ligation    . Laparoscopic gastric sleeve resection  11/21/12    [facility]   Family History  Problem Relation Age of Onset  . Hyperlipidemia Mother   . Sarcoidosis Father   . Lung disease Father     Pleural Mesothelioma   History  Substance Use Topics  . Smoking status: Never Smoker   . Smokeless tobacco: Never Used  . Alcohol Use: No   OB History   Grav Para Term Preterm Abortions TAB SAB Ect Mult Living                 Review of Systems  Constitutional: Negative for fever.  Gastrointestinal: Negative.   Musculoskeletal:       + L wrist pain and swelling.  Skin: Negative for wound.  Neurological: Positive for numbness.      Allergies  Flu virus vaccine  Home Medications    Prior to Admission medications   Medication Sig Start Date End Date Taking? Authorizing Provider  Abatacept (ORENCIA IV) Inject 1,000 mg into the vein every 28 (twenty-eight) days.    Yes Historical Provider, MD  Acyclovir-Hydrocortisone (XERESE) 5-1 % CREA Apply 1 application topically every 24 hours x 5 doses. 01/03/14 01/04/15 Yes [name], MD  benzonatate (TESSALON) 200 MG capsule Take 1 capsule (200 mg total) by mouth 3 (three) times daily as needed for cough. 01/03/14 01/04/15 Yes [name], MD  cetirizine (ZYRTEC) 10 MG tablet Take 1 tablet (10 mg total) by mouth daily. 08/27/13 08/28/14 Yes [name], MD  clindamycin (CLINDAGEL) 1 % gel Apply topically 2 times daily.   Yes Historical Provider, MD  cyclobenzaprine (FLEXERIL) 10 MG tablet Take 10 mg by mouth 3 (three) times daily as needed for muscle spasms.   Yes Historical Provider, MD  diltiazem (CARDIZEM CD) 180 MG 24 hr capsule Take 1 capsule (180 mg total) by mouth daily. 05/18/13  Yes Quintella Reichert, MD  DULoxetine (CYMBALTA) 60 MG capsule Take 1 capsule (60 mg total) by mouth daily. 01/03/14  Yes [name], MD  EPINEPHrine 0.3 mg/0.3 mL IJ SOAJ injection Inject 0.3 mLs (0.3 mg total) into the muscle once. 01/03/14 01/04/15 Yes [name], MD  famciclovir (FAMVIR) 500 MG tablet Take three tablets at onset of fever blisters x 1 day 01/03/14  Yes [name], MD  fluticasone (FLONASE) 50 MCG/ACT nasal spray Place 2 sprays into both nostrils as needed for allergies. 01/03/14 01/04/15 Yes [name], MD  gabapentin (NEURONTIN) 100 MG capsule Take 1 capsule by mouth 3 (three) times daily.   Yes Historical Provider, MD  HYDROcodone-acetaminophen (NORCO/VICODIN) 5-325 MG per tablet Take 1 tablet by mouth every 6 (six) hours as needed for pain. 10/22/12  Yes Jamesetta Orleans Lawyer, PA-C  hydrOXYzine (VISTARIL) 25 MG capsule Take 1 capsule BID prn for increased anxiety 01/03/14 01/03/15 Yes [name], MD  lidocaine  (LIDODERM) 5 % as needed. 06/04/13  Yes Historical Provider, MD  methotrexate 25 MG/ML injection Inject 1 mL into the muscle once a week. 06/21/13  Yes Historical Provider, MD  omeprazole (PRILOSEC) 20 MG capsule Take 20 mg by mouth daily.   Yes Historical Provider, MD  TL GARD RX 2.2-25-1 MG TABS  07/06/13  Yes Historical Provider, MD  Topiramate ER (TROKENDI XR) 50 MG CP24 Take 1 tablet by mouth daily. 01/03/14 01/04/15 Yes [name], MD  VOLTAREN 1 % GEL Apply 2 g topically daily as needed.  05/02/13  Yes Historical Provider, MD  acetaminophen-codeine (TYLENOL #3) 300-30 MG per tablet Take 1 tablet by mouth every 8 (eight) hours as needed for moderate pain. 01/22/14   Trevor Mace, PA-C  doxycycline (VIBRAMYCIN) 100 MG capsule Take 1 capsule (100 mg total) by mouth 2 (two) times daily. One po bid x 7 days 10/25/13   Carlisle Beers Molpus, MD   BP 114/79  Pulse 91  Temp(Src) 97.6 F (36.4 C) (Oral)  Resp 20  Ht 5\' 2"  (1.575 m)  Wt 243 lb (110.224 kg)  BMI 44.43 kg/m2  SpO2 99% Physical Exam  Nursing note and vitals reviewed. Constitutional: She is oriented to person, place, and time. She appears well-developed and well-nourished. No distress.  HENT:  Head: Normocephalic and atraumatic.  Mouth/Throat: Oropharynx is clear and moist.  Eyes: Conjunctivae are normal.  Neck: Normal range of motion. Neck supple.  Cardiovascular: Normal rate, regular rhythm, normal heart sounds and intact distal pulses.   +2 radial pulse on left. Cap refill < 3 seconds.  Pulmonary/Chest: Effort normal and breath sounds normal.  Abdominal: There is no tenderness.  Musculoskeletal:  Tender to palpation over distal aspect of left radius and ulna with mild swelling. No snuffbox tenderness. Range of motion limited by pain.  Neurological: She is alert and oriented to person, place, and time.  Sensation intact.  Skin: Skin is warm and dry. She is not diaphoretic.  Psychiatric: She has a normal mood and affect. Her  behavior is normal.    ED Course  Procedures (including critical care time) Labs Review Labs Reviewed - No data to display  Imaging Review Dg Wrist Complete Left  01/22/2014   CLINICAL DATA:  Left wrist pain after a fall tonight.  EXAM: LEFT WRIST - COMPLETE 3+ VIEW  COMPARISON:  11/28/2009  FINDINGS: There is no evidence of fracture or dislocation. There is no evidence of arthropathy or other focal bone abnormality. Soft tissues are unremarkable.  IMPRESSION: Normal exam.   Electronically Signed   By: 11/30/2009 M.D.   On: 01/22/2014 19:18   Ct Chest Wo Contrast  01/21/2014   CLINICAL DATA:  Follow-up or pulmonary nodule.  EXAM: CT CHEST WITHOUT CONTRAST  TECHNIQUE: Multidetector CT imaging  of the chest was performed following the standard protocol without IV contrast.  COMPARISON:  Chest x-ray dated 08/29/2013 and CT scan dated 07/23/2013  FINDINGS: There is a faint 4 mm nodule in the right middle lobe along the minor fissure which is better defined on the current exam on image 22 of series 3. There is also a 3 mm nodule at the right lung base posterior medially on image 35 of series 3.  Both of these not with nodules are better defined on the reconstructed sagittal and coronal images. The lungs are otherwise clear except for slight linear scarring at the left lung base, stable. No hilar or mediastinal adenopathy. Heart size is normal. No effusions. No osseous abnormalities.  IMPRESSION: Two tiny nodules in the right lung, 1 in the right middle lobe and 1 in the right lower lobe. If the patient is at high risk for bronchogenic carcinoma, follow-up chest CT at 1 year is recommended. If the patient is at low risk, no follow-up is needed. This recommendation follows the consensus statement: Guidelines for Management of Small Pulmonary Nodules Detected on CT Scans: A Statement from the Fleischner Society as published in Radiology 2005; 237:395-400.   Electronically Signed   By: Geanie Cooley M.D.   On:  01/21/2014 09:24     EKG Interpretation None      MDM   Final diagnoses:  Left wrist sprain, initial encounter   Patient presenting with wrist pain after fall. Neurovascularly intact. X-ray without any acute finding. Wrist splint applied. Discussed RICE. Stable for discharge. Followup with PCP. Return precautions given. Patient states understanding of treatment care plan and is agreeable.  Trevor Mace, PA-C 01/22/14 2015

## 2014-01-23 NOTE — ED Provider Notes (Signed)
Medical screening examination/treatment/procedure(s) were performed by non-physician practitioner and as supervising physician I was immediately available for consultation/collaboration.   EKG Interpretation None        Masahiro Iglesia, MD 01/23/14 1105 

## 2014-01-24 ENCOUNTER — Ambulatory Visit: Payer: Managed Care, Other (non HMO) | Admitting: Pulmonary Disease

## 2014-01-28 ENCOUNTER — Telehealth: Payer: Self-pay | Admitting: Pulmonary Disease

## 2014-01-28 NOTE — Telephone Encounter (Signed)
Spoke with pt, she is concerned because she was only aware of 1 nodule in her lung and her MyChart reading of the latest CT chest states she has 2 nodules.  She is requesting an appt with BQ to review ct chest in detail.  Appt has been scheduled for 02/05/14.  Nothing further needed at this time.

## 2014-02-05 ENCOUNTER — Encounter: Payer: Self-pay | Admitting: Pulmonary Disease

## 2014-02-05 ENCOUNTER — Ambulatory Visit (INDEPENDENT_AMBULATORY_CARE_PROVIDER_SITE_OTHER): Payer: Managed Care, Other (non HMO) | Admitting: Pulmonary Disease

## 2014-02-05 ENCOUNTER — Ambulatory Visit: Payer: Managed Care, Other (non HMO) | Admitting: Pulmonary Disease

## 2014-02-05 VITALS — BP 118/66 | HR 99 | Ht 63.0 in | Wt 246.0 lb

## 2014-02-05 DIAGNOSIS — R911 Solitary pulmonary nodule: Secondary | ICD-10-CM

## 2014-02-05 NOTE — Progress Notes (Signed)
Subjective:    Patient ID: [name] [name], female    DOB: [birth year], 48 y.o.   MRN: [id number]  Synopsis: First came to the [facility] in 2000 at 17 for evaluation of dyspnea and a pulmonary nodule which is seen on a CT scan of her heart. The nodule was 5.3 mm and groundglass and located in the right middle lobe. She is a never smoker. She had pulmonary function testing performed which was normal. HPI   02/05/2014 ROV > [name] [name] has been doing OK. She has been struggling with SI joint pain and rheumatoid arthritis pains requiring injections.  Otherwise her breathing has been stable.  Her weight has been stable.  No new respiratory complaints.  Past Medical History  Diagnosis Date  . Rheumatoid arthritis(714.0)   . Depression   . Morbid obesity   . HSV-1 infection   . Diastolic dysfunction   . Fibromyalgia      Review of Systems  Constitutional: Negative for fever, chills and fatigue.  HENT: Negative for postnasal drip, rhinorrhea and sinus pressure.   Respiratory: Negative for cough, shortness of breath and stridor.   Cardiovascular: Negative for chest pain, palpitations and leg swelling.       Objective:   Physical Exam  Filed Vitals:   02/05/14 1321  BP: 118/66  Pulse: 99  Height: 5\' 3"  (1.6 m)  Weight: 246 lb (111.585 kg)  SpO2: 100%    RA  Gen: Obese but well appearing, no acute distress HEENT: NCAT, EOMi, OP clear,  PULM: CTA B CV: RRR, no mgr, no JVD AB: BS+, soft, nontender, no hsm Ext: warm, no edema, no clubbing, no cyanosis      Assessment & Plan:   Solitary pulmonary nodule The CT chest showed two "tiny" nodules instead of one but that is because the initial CT thorax was a cardiac protocol and didn't capture the entire lungs.  These nodules are very tiny and low risk for malignancy.  Because of her father's history of lung cancer she would prefer to continue serial CT scans, so we will do one more in a year.  She is up to date with  her other age appropriate screening.      Updated Medication List Outpatient Encounter Prescriptions as of 02/05/2014  Medication Sig  . Abatacept (ORENCIA IV) Inject 1,000 mg into the vein every 28 (twenty-eight) days.   . Acyclovir-Hydrocortisone (XERESE) 5-1 % CREA Apply 1 application topically every 24 hours x 5 doses.  . benzonatate (TESSALON) 200 MG capsule Take 1 capsule (200 mg total) by mouth 3 (three) times daily as needed for cough.  . cetirizine (ZYRTEC) 10 MG tablet Take 1 tablet (10 mg total) by mouth daily.  . clindamycin (CLINDAGEL) 1 % gel Apply topically 2 times daily.  . cyclobenzaprine (FLEXERIL) 10 MG tablet Take 10 mg by mouth 3 (three) times daily as needed for muscle spasms.  04/07/2014 diltiazem (CARDIZEM CD) 180 MG 24 hr capsule Take 1 capsule (180 mg total) by mouth daily.  . DULoxetine (CYMBALTA) 60 MG capsule Take 60 mg by mouth 3 (three) times daily.  Marland Kitchen EPINEPHrine 0.3 mg/0.3 mL IJ SOAJ injection Inject 0.3 mLs (0.3 mg total) into the muscle once.  . famciclovir (FAMVIR) 500 MG tablet Take three tablets at onset of fever blisters x 1 day  . fluticasone (FLONASE) 50 MCG/ACT nasal spray Place 2 sprays into both nostrils as needed for allergies.  Marland Kitchen gabapentin (NEURONTIN) 100 MG capsule Take 1 capsule  by mouth 3 (three) times daily.  Marland Kitchen HYDROcodone-acetaminophen (NORCO/VICODIN) 5-325 MG per tablet Take 1 tablet by mouth every 6 (six) hours as needed for pain.  . hydrOXYzine (VISTARIL) 25 MG capsule Take 1 capsule BID prn for increased anxiety  . lidocaine (LIDODERM) 5 % as needed.  . methotrexate 25 MG/ML injection Inject 1 mL into the muscle once a week.  Marland Kitchen omeprazole (PRILOSEC) 20 MG capsule Take 20 mg by mouth daily.  . TL GARD RX 2.2-25-1 MG TABS Vitamin supplement- takes 1 tab qd  . Topiramate ER 50 MG CP24 Take 1 tablet by mouth daily as needed.  . VOLTAREN 1 % GEL Apply 2 g topically daily as needed.   . [DISCONTINUED] DULoxetine (CYMBALTA) 60 MG capsule Take 1 capsule  (60 mg total) by mouth daily.  . [DISCONTINUED] Topiramate ER (TROKENDI XR) 50 MG CP24 Take 1 tablet by mouth daily.  . [DISCONTINUED] acetaminophen-codeine (TYLENOL #3) 300-30 MG per tablet Take 1 tablet by mouth every 8 (eight) hours as needed for moderate pain.  . [DISCONTINUED] doxycycline (VIBRAMYCIN) 100 MG capsule Take 1 capsule (100 mg total) by mouth 2 (two) times daily. One po bid x 7 days

## 2014-02-05 NOTE — Assessment & Plan Note (Signed)
The CT chest showed two "tiny" nodules instead of one but that is because the initial CT thorax was a cardiac protocol and didn't capture the entire lungs.  These nodules are very tiny and low risk for malignancy.  Because of her father's history of lung cancer she would prefer to continue serial CT scans, so we will do one more in a year.  She is up to date with her other age appropriate screening.

## 2014-02-05 NOTE — Patient Instructions (Signed)
We will order another CT for one year from now and see you after that

## 2014-02-07 ENCOUNTER — Inpatient Hospital Stay (HOSPITAL_COMMUNITY): Admission: RE | Admit: 2014-02-07 | Payer: Managed Care, Other (non HMO) | Source: Ambulatory Visit

## 2014-02-08 ENCOUNTER — Encounter (HOSPITAL_COMMUNITY)
Admission: RE | Admit: 2014-02-08 | Discharge: 2014-02-08 | Disposition: A | Discharge status: Home / Self Care | Payer: Managed Care, Other (non HMO) | Source: Ambulatory Visit | Attending: Rheumatology | Admitting: Rheumatology

## 2014-02-08 DIAGNOSIS — M069 Rheumatoid arthritis, unspecified: Secondary | ICD-10-CM | POA: Diagnosis present

## 2014-02-08 MED ORDER — ABATACEPT 250 MG IV SOLR
1000.0000 mg | INTRAVENOUS | Status: DC
  Administered 2014-02-08: 1000 mg via INTRAVENOUS
  Filled 2014-02-08: qty 40

## 2014-02-08 MED ORDER — SODIUM CHLORIDE 0.9 % IV SOLN
INTRAVENOUS | Status: DC
  Administered 2014-02-08: 250 mL via INTRAVENOUS

## 2014-02-19 ENCOUNTER — Ambulatory Visit (INDEPENDENT_AMBULATORY_CARE_PROVIDER_SITE_OTHER): Payer: Managed Care, Other (non HMO) | Admitting: Cardiology

## 2014-02-19 ENCOUNTER — Encounter: Payer: Self-pay | Admitting: Cardiology

## 2014-02-19 VITALS — Ht 62.0 in | Wt 234.0 lb

## 2014-02-19 DIAGNOSIS — R0789 Other chest pain: Secondary | ICD-10-CM

## 2014-02-19 DIAGNOSIS — I5189 Other ill-defined heart diseases: Secondary | ICD-10-CM

## 2014-02-19 DIAGNOSIS — I519 Heart disease, unspecified: Secondary | ICD-10-CM

## 2014-02-19 DIAGNOSIS — I2510 Atherosclerotic heart disease of native coronary artery without angina pectoris: Secondary | ICD-10-CM

## 2014-02-19 NOTE — Progress Notes (Signed)
[address] [place], [place]  [address] Phone: [phone] Fax:  [phone]  Date:  02/20/2014   ID:  [name] [name], DOB May 29, 1966, MRN [id number]  PCP:  Birdena Jubilee, MD  Cardiologist:  Armanda Magic, MD     History of Present Illness: [name] [name] is a 48 y.o. female with a history of diastolic dysfunction, RA, depression who presents back today. She initially saw me for atypical CP and cardiac workup with nuclear stress test and echo were normal with diastolic dysfunction on echo. A coronary CTA showed calcium score in the 95th% but no obstructive ASCAD (30% prox LAD stenosis).  She is doing well today . She denies any chest pain, SOB, DOE, LE edema, dizziness, palpitations or syncope.  Wt Readings from Last 3 Encounters:  02/19/14 234 lb (106.142 kg)  02/08/14 244 lb (110.678 kg)  02/05/14 246 lb (111.585 kg)     Past Medical History  Diagnosis Date  . Rheumatoid arthritis(714.0)   . Depression   . Morbid obesity   . HSV-1 infection   . Diastolic dysfunction   . Fibromyalgia     Current Outpatient Prescriptions  Medication Sig Dispense Refill  . Abatacept (ORENCIA IV) Inject 1,000 mg into the vein every 28 (twenty-eight) days.       . Acyclovir-Hydrocortisone (XERESE) 5-1 % CREA Apply 1 application topically every 24 hours x 5 doses.  5 g  11  . benzonatate (TESSALON) 200 MG capsule Take 1 capsule (200 mg total) by mouth 3 (three) times daily as needed for cough.  60 capsule  0  . cetirizine (ZYRTEC) 10 MG tablet Take 1 tablet (10 mg total) by mouth daily.  100 tablet  3  . clindamycin (CLINDAGEL) 1 % gel Apply topically 2 times daily.      . cyclobenzaprine (FLEXERIL) 10 MG tablet Take 10 mg by mouth 3 (three) times daily as needed for muscle spasms.      Marland Kitchen diltiazem (CARDIZEM CD) 180 MG 24 hr capsule Take 1 capsule (180 mg total) by mouth daily.  90 capsule  3  . DULoxetine (CYMBALTA) 60 MG capsule Take 60 mg by mouth 3 (three) times daily.       Marland Kitchen EPINEPHrine 0.3 mg/0.3 mL IJ SOAJ injection Inject 0.3 mLs (0.3 mg total) into the muscle once.  2 Device  11  . famciclovir (FAMVIR) 500 MG tablet Take three tablets at onset of fever blisters x 1 day  30 tablet  11  . fluticasone (FLONASE) 50 MCG/ACT nasal spray Place 2 sprays into both nostrils as needed for allergies.  16 g  11  . gabapentin (NEURONTIN) 100 MG capsule Take 1 capsule by mouth 3 (three) times daily.      Marland Kitchen HYDROcodone-acetaminophen (NORCO/VICODIN) 5-325 MG per tablet Take 1 tablet by mouth every 6 (six) hours as needed for pain.  15 tablet  0  . hydrOXYzine (VISTARIL) 25 MG capsule Take 1 capsule BID prn for increased anxiety  60 capsule  3  . lidocaine (LIDODERM) 5 % as needed.      Marland Kitchen omeprazole (PRILOSEC) 20 MG capsule Take 20 mg by mouth daily.      . tapentadol (NUCYNTA) 50 MG TABS tablet Take 50 mg by mouth every 8 (eight) hours.      . TL GARD RX 2.2-25-1 MG TABS Vitamin supplement- takes 1 tab qd      . Topiramate ER 50 MG CP24 Take 1 tablet by mouth daily  as needed.      . VOLTAREN 1 % GEL Apply 2 g topically daily as needed.       . methotrexate 25 MG/ML injection Inject 1 mL into the muscle once a week.       No current facility-administered medications for this visit.    Allergies:    Allergies  Allergen Reactions  . Flu Virus Vaccine Anaphylaxis    Social History:  The patient  reports that she has never smoked. She has never used smokeless tobacco. She reports that she does not drink alcohol or use illicit drugs.   Family History:  The patient's family history includes Hyperlipidemia in her mother; Lung disease in her father; Sarcoidosis in her father.   ROS:  Please see the history of present illness.      All other systems reviewed and negative.   PHYSICAL EXAM: VS:  Ht 5\' 2"  (1.575 m)  Wt 234 lb (106.142 kg)  BMI 42.79 kg/m2 Well nourished, well developed, in no acute distress HEENT: normal Neck: no JVD Cardiac:  normal S1, S2; RRR; no  murmur Lungs:  clear to auscultation bilaterally, no wheezing, rhonchi or rales Abd: soft, nontender, no hepatomegaly Ext: no edema Skin: warm and dry Neuro:  CNs 2-12 intact, no focal abnormalities noted  ASSESSMENT AND PLAN:         1.  Atypical CP with recent normal nuclear stress test and echo. Coronary CTA calcium Score 128 isolated to foci in proximal and mid LAD 98th percentile for age and sex matched controls with Less than 30% calcific disease in proximal and mid LAD. No obstructive CAD.  Her CP has resolved. I have asked her to start ASA 81mg  daily.  She needs aggressive risk factor modification.  Check FLP and ALT 2.  Diastolic dysfunction by echo 3.  Palpitations - occasionally she has some but tolerates them well.  Continue Diltiazem  Followup with me in 6 months      Signed, , MD 02/20/2014 9:56 PM

## 2014-02-19 NOTE — Patient Instructions (Addendum)
Your physician recommends that you continue on your current medications as directed. Please refer to the Current Medication list given to you today.  Your physician wants you to follow-up in: 6 months with Dr Turner You will receive a reminder letter in the mail two months in advance. If you don't receive a letter, please call our office to schedule the follow-up appointment.  

## 2014-02-20 ENCOUNTER — Telehealth: Payer: Self-pay | Admitting: Cardiology

## 2014-02-20 NOTE — Telephone Encounter (Signed)
Please have patient come in for fasting lipid panel and ALT

## 2014-02-21 NOTE — Telephone Encounter (Signed)
lmtrc

## 2014-02-22 ENCOUNTER — Other Ambulatory Visit: Payer: Self-pay | Admitting: General Surgery

## 2014-02-22 DIAGNOSIS — I2510 Atherosclerotic heart disease of native coronary artery without angina pectoris: Secondary | ICD-10-CM

## 2014-02-22 NOTE — Telephone Encounter (Signed)
Pt made appt for LIPID and ALT

## 2014-02-27 ENCOUNTER — Other Ambulatory Visit (INDEPENDENT_AMBULATORY_CARE_PROVIDER_SITE_OTHER): Payer: Managed Care, Other (non HMO)

## 2014-02-27 DIAGNOSIS — I2510 Atherosclerotic heart disease of native coronary artery without angina pectoris: Secondary | ICD-10-CM

## 2014-02-27 LAB — ALT: ALT: 16 U/L (ref 0–35)

## 2014-03-08 ENCOUNTER — Encounter (HOSPITAL_COMMUNITY)
Admission: RE | Admit: 2014-03-08 | Discharge: 2014-03-08 | Disposition: A | Discharge status: Home / Self Care | Payer: Managed Care, Other (non HMO) | Source: Ambulatory Visit | Attending: Rheumatology | Admitting: Rheumatology

## 2014-03-08 DIAGNOSIS — M069 Rheumatoid arthritis, unspecified: Secondary | ICD-10-CM | POA: Insufficient documentation

## 2014-03-08 MED ORDER — SODIUM CHLORIDE 0.9 % IV SOLN
1000.0000 mg | INTRAVENOUS | Status: DC
  Administered 2014-03-08: 1000 mg via INTRAVENOUS
  Filled 2014-03-08: qty 40

## 2014-03-08 MED ORDER — SODIUM CHLORIDE 0.9 % IV SOLN
INTRAVENOUS | Status: DC
  Administered 2014-03-08: 250 mL via INTRAVENOUS

## 2014-04-05 ENCOUNTER — Encounter (HOSPITAL_COMMUNITY)
Admission: RE | Admit: 2014-04-05 | Discharge: 2014-04-05 | Disposition: A | Discharge status: Home / Self Care | Payer: Managed Care, Other (non HMO) | Source: Ambulatory Visit | Attending: Rheumatology | Admitting: Rheumatology

## 2014-04-05 DIAGNOSIS — M069 Rheumatoid arthritis, unspecified: Secondary | ICD-10-CM | POA: Diagnosis not present

## 2014-04-05 MED ORDER — SODIUM CHLORIDE 0.9 % IV SOLN
INTRAVENOUS | Status: DC
  Administered 2014-04-05: 12:00:00 via INTRAVENOUS

## 2014-04-05 MED ORDER — SODIUM CHLORIDE 0.9 % IV SOLN
1000.0000 mg | INTRAVENOUS | Status: DC
  Administered 2014-04-05: 1000 mg via INTRAVENOUS
  Filled 2014-04-05: qty 40

## 2014-05-06 ENCOUNTER — Encounter (HOSPITAL_COMMUNITY): Payer: Managed Care, Other (non HMO)

## 2014-05-10 ENCOUNTER — Other Ambulatory Visit (HOSPITAL_COMMUNITY): Payer: Self-pay | Admitting: *Deleted

## 2014-05-13 ENCOUNTER — Encounter (HOSPITAL_COMMUNITY)
Admission: RE | Admit: 2014-05-13 | Discharge: 2014-05-13 | Disposition: A | Discharge status: Home / Self Care | Payer: Managed Care, Other (non HMO) | Source: Ambulatory Visit | Attending: Rheumatology | Admitting: Rheumatology

## 2014-05-13 DIAGNOSIS — M069 Rheumatoid arthritis, unspecified: Secondary | ICD-10-CM | POA: Insufficient documentation

## 2014-05-13 MED ORDER — ABATACEPT 250 MG IV SOLR
1000.0000 mg | INTRAVENOUS | Status: DC
  Administered 2014-05-13: 1000 mg via INTRAVENOUS
  Filled 2014-05-13: qty 40

## 2014-05-13 MED ORDER — SODIUM CHLORIDE 0.9 % IV SOLN
INTRAVENOUS | Status: DC
  Administered 2014-05-13: 10:00:00 via INTRAVENOUS

## 2014-05-22 ENCOUNTER — Other Ambulatory Visit: Payer: Self-pay | Admitting: Cardiology

## 2014-06-05 ENCOUNTER — Other Ambulatory Visit (INDEPENDENT_AMBULATORY_CARE_PROVIDER_SITE_OTHER): Payer: Self-pay | Admitting: Family Medicine

## 2014-06-05 ENCOUNTER — Other Ambulatory Visit: Payer: Self-pay | Admitting: Cardiology

## 2014-06-05 ENCOUNTER — Encounter (HOSPITAL_COMMUNITY): Payer: Managed Care, Other (non HMO)

## 2014-06-05 NOTE — Telephone Encounter (Signed)
Ok to refill 

## 2014-06-10 ENCOUNTER — Encounter (HOSPITAL_COMMUNITY): Payer: Managed Care, Other (non HMO)

## 2014-06-12 ENCOUNTER — Encounter: Payer: Self-pay | Admitting: Cardiology

## 2014-06-26 ENCOUNTER — Encounter (HOSPITAL_COMMUNITY)
Admission: RE | Admit: 2014-06-26 | Discharge: 2014-06-26 | Disposition: A | Discharge status: Home / Self Care | Payer: Managed Care, Other (non HMO) | Source: Ambulatory Visit | Attending: Rheumatology | Admitting: Rheumatology

## 2014-06-26 DIAGNOSIS — M069 Rheumatoid arthritis, unspecified: Secondary | ICD-10-CM | POA: Diagnosis not present

## 2014-06-26 DIAGNOSIS — Z79899 Other long term (current) drug therapy: Secondary | ICD-10-CM | POA: Insufficient documentation

## 2014-06-26 MED ORDER — SODIUM CHLORIDE 0.9 % IV SOLN
INTRAVENOUS | Status: DC
  Administered 2014-06-26: 12:00:00 via INTRAVENOUS

## 2014-06-26 MED ORDER — ABATACEPT 250 MG IV SOLR
1000.0000 mg | INTRAVENOUS | Status: DC
  Administered 2014-06-26: 1000 mg via INTRAVENOUS
  Filled 2014-06-26: qty 40

## 2014-07-15 ENCOUNTER — Other Ambulatory Visit (HOSPITAL_COMMUNITY): Payer: Self-pay | Admitting: Orthopaedic Surgery

## 2014-07-15 DIAGNOSIS — M461 Sacroiliitis, not elsewhere classified: Secondary | ICD-10-CM

## 2014-07-18 ENCOUNTER — Encounter (HOSPITAL_COMMUNITY)
Admission: RE | Admit: 2014-07-18 | Discharge: 2014-07-18 | Disposition: A | Discharge status: Home / Self Care | Payer: Managed Care, Other (non HMO) | Source: Ambulatory Visit | Attending: Orthopaedic Surgery | Admitting: Orthopaedic Surgery

## 2014-07-18 DIAGNOSIS — M461 Sacroiliitis, not elsewhere classified: Secondary | ICD-10-CM | POA: Diagnosis present

## 2014-07-18 DIAGNOSIS — M1990 Unspecified osteoarthritis, unspecified site: Secondary | ICD-10-CM | POA: Diagnosis not present

## 2014-07-18 MED ORDER — TECHNETIUM TC 99M MEDRONATE IV KIT
26.2000 | PACK | Freq: Once | INTRAVENOUS | Status: AC | PRN
  Administered 2014-07-18: 26.2 via INTRAVENOUS

## 2014-07-23 DIAGNOSIS — K219 Gastro-esophageal reflux disease without esophagitis: Secondary | ICD-10-CM | POA: Insufficient documentation

## 2014-07-25 ENCOUNTER — Other Ambulatory Visit (INDEPENDENT_AMBULATORY_CARE_PROVIDER_SITE_OTHER): Payer: Self-pay | Admitting: Family Medicine

## 2014-07-25 ENCOUNTER — Other Ambulatory Visit: Payer: Self-pay | Admitting: Cardiology

## 2014-07-26 ENCOUNTER — Other Ambulatory Visit: Payer: Self-pay

## 2014-07-26 MED ORDER — DILTIAZEM HCL ER COATED BEADS 180 MG PO CP24: 180.0000 mg | ORAL_CAPSULE | Freq: Every day | ORAL | Status: DC

## 2014-07-31 ENCOUNTER — Other Ambulatory Visit (HOSPITAL_COMMUNITY): Payer: Self-pay | Admitting: *Deleted

## 2014-08-02 ENCOUNTER — Inpatient Hospital Stay (HOSPITAL_COMMUNITY): Admission: RE | Admit: 2014-08-02 | Payer: Managed Care, Other (non HMO) | Source: Ambulatory Visit

## 2014-08-06 ENCOUNTER — Other Ambulatory Visit (HOSPITAL_COMMUNITY): Payer: Self-pay | Admitting: *Deleted

## 2014-08-07 ENCOUNTER — Encounter (HOSPITAL_COMMUNITY)
Admission: RE | Admit: 2014-08-07 | Discharge: 2014-08-07 | Disposition: A | Discharge status: Home / Self Care | Payer: Managed Care, Other (non HMO) | Source: Ambulatory Visit | Attending: Rheumatology | Admitting: Rheumatology

## 2014-08-07 DIAGNOSIS — M069 Rheumatoid arthritis, unspecified: Secondary | ICD-10-CM | POA: Insufficient documentation

## 2014-08-07 MED ORDER — DIPHENHYDRAMINE HCL 25 MG PO TABS
50.0000 mg | ORAL_TABLET | ORAL | Status: DC
  Filled 2014-08-07: qty 2

## 2014-08-07 MED ORDER — ACETAMINOPHEN 325 MG PO TABS: 650.0000 mg | ORAL_TABLET | ORAL | Status: DC

## 2014-08-07 MED ORDER — SODIUM CHLORIDE 0.9 % IV SOLN
INTRAVENOUS | Status: DC
  Administered 2014-08-07: 10:00:00 via INTRAVENOUS

## 2014-08-07 MED ORDER — SODIUM CHLORIDE 0.9 % IV SOLN
1000.0000 mg | INTRAVENOUS | Status: DC
  Administered 2014-08-07: 1000 mg via INTRAVENOUS
  Filled 2014-08-07: qty 40

## 2014-08-21 ENCOUNTER — Encounter: Payer: Self-pay | Admitting: Cardiology

## 2014-08-21 NOTE — Progress Notes (Signed)
Cardiology Office Note   Date:  08/22/2014   ID:  [name] [name], DOB July 26, 1965, MRN [id number]  PCP:  Birdena Jubilee, MD  Cardiologist:   Quintella Reichert, MD   Chief Complaint  Patient presents with  . Coronary Artery Disease  . Palpitations      History of Present Illness:  [name] [name] is a 49 y.o. female with a history of diastolic dysfunction, RA, depression who presents back today. She initially saw me for atypical CP and cardiac workup with nuclear stress test and echo were normal with diastolic dysfunction on echo. A coronary CTA showed calcium score in the 95th% but no obstructive ASCAD (30% prox LAD stenosis). Today she started having pain in her chest that she describes as someone sitting on her chest.  It has been constant since 12noon but has been getting worse.  The pain is also in her jaw and neck and also tingles.  Her left arm is also tingling and feels asleep.  She is nauseated and feels like she wants to vomit.  She had some mild diaphoresis earlier.  She denies any fever or chills.  She had one bout of diarrhea when this started.     Past Medical History  Diagnosis Date  . Rheumatoid arthritis(714.0)   . Depression   . Morbid obesity   . HSV-1 infection   . Diastolic dysfunction   . Fibromyalgia   . Coronary artery calcification seen on CAT scan     Past Surgical History  Procedure Laterality Date  . Cesarean section    . Tubal ligation    . Laparoscopic gastric sleeve resection  11/21/12    [facility]     Current Outpatient Prescriptions  Medication Sig Dispense Refill  . Abatacept (ORENCIA IV) Inject 1,000 mg into the vein every 28 (twenty-eight) days.     . Acyclovir-Hydrocortisone (XERESE) 5-1 % CREA Apply 1 application topically every 24 hours x 5 doses. 5 g 11  . benzonatate (TESSALON) 200 MG capsule Take 1 capsule (200 mg total) by mouth 3 (three) times daily as needed for cough. 60 capsule 0  . cetirizine (ZYRTEC) 10 MG  tablet Take 1 tablet (10 mg total) by mouth daily. 100 tablet 3  . cyclobenzaprine (FLEXERIL) 10 MG tablet Take 10 mg by mouth 3 (three) times daily as needed for muscle spasms.    Marland Kitchen diltiazem (CARTIA XT) 180 MG 24 hr capsule Take 1 capsule (180 mg total) by mouth daily. 90 capsule 1  . DULoxetine (CYMBALTA) 60 MG capsule Take 60 mg by mouth 3 (three) times daily.    Marland Kitchen EPINEPHrine 0.3 mg/0.3 mL IJ SOAJ injection Inject 0.3 mLs (0.3 mg total) into the muscle once. 2 Device 11  . famciclovir (FAMVIR) 500 MG tablet Take three tablets at onset of fever blisters x 1 day 30 tablet 11  . fluticasone (FLONASE) 50 MCG/ACT nasal spray Place 2 sprays into both nostrils as needed for allergies. 16 g 11  . gabapentin (NEURONTIN) 100 MG capsule Take 1 capsule by mouth 3 (three) times daily.    Marland Kitchen HYDROcodone-acetaminophen (NORCO/VICODIN) 5-325 MG per tablet Take 1 tablet by mouth every 6 (six) hours as needed for pain. 15 tablet 0  . hydrOXYzine (VISTARIL) 25 MG capsule Take 1 capsule BID prn for increased anxiety 60 capsule 3  . lidocaine (LIDODERM) 5 % as needed.    . methotrexate 25 MG/ML injection Inject 1 mL into the muscle once a week.    Marland Kitchen  tapentadol (NUCYNTA) 50 MG TABS tablet Take 50 mg by mouth every 8 (eight) hours.    . TL GARD RX 2.2-25-1 MG TABS Vitamin supplement- takes 1 tab qd    . Topiramate ER 50 MG CP24 Take 1 tablet by mouth daily as needed.    . VOLTAREN 1 % GEL Apply 2 g topically daily as needed.      No current facility-administered medications for this visit.    Allergies:   Flu virus vaccine and Influenza vaccines    Social History:  The patient  reports that she has never smoked. She has never used smokeless tobacco. She reports that she does not drink alcohol or use illicit drugs.   Family History:  The patient's family history includes Hyperlipidemia in her mother; Lung disease in her father; Sarcoidosis in her father.    ROS:  Please see the history of present illness.    Otherwise, review of systems are positive for none.   All other systems are reviewed and negative.    PHYSICAL EXAM: VS:  BP 120/90 mmHg  Pulse 96  Ht 5\' 2"  (1.575 m)  Wt 246 lb 9.6 oz (111.857 kg)  BMI 45.09 kg/m2 , BMI Body mass index is 45.09 kg/(m^2). GEN: Well nourished, well developed, in no acute distress HEENT: normal Neck: no JVD, carotid bruits, or masses Cardiac: RRR; no murmurs, rubs, or gallops,no edema  Respiratory:  clear to auscultation bilaterally, normal work of breathing GI: soft, nontender, nondistended, + BS MS: no deformity or atrophy Skin: warm and dry, no rash Neuro:  Strength and sensation are intact Psych: euthymic mood, full affect   EKG:  EKG was ordered today and showed NSR at 96bpm with IRBBB and no acute ST changes    Recent Labs: 02/27/2014: ALT 16    Lipid Panel No results found for: CHOL, TRIG, HDL, CHOLHDL, VLDL, LDLCALC, LDLDIRECT    Wt Readings from Last 3 Encounters:  08/22/14 246 lb 9.6 oz (111.857 kg)  08/07/14 249 lb (112.946 kg)  06/26/14 244 lb (110.678 kg)    ASSESSMENT AND PLAN:   1. Atypical CP with  normal nuclear stress test and echo. Coronary CTA calcium Score 128 isolated to foci in proximal and mid LAD 98th percentile for age and sex matched controls with Less than 30% calcific disease in proximal and mid LAD. No obstructive CAD. Her CP has resolved.  Continue ASA 81mg  daily. She needs aggressive risk factor modification. She was supposed to get her lipids checked last OV but did not come in to get it done.  Check FLP.        2.  Acute onset of chest pressure with left face tingling and numbness and left arm numbness that started around noon and has gotten progressively worse.  She is very uncomfortable rating her CP as an 8/10.  She is nauseated as well.  I will call EMS to transfer her to [facility] ER for further evaluation.   2. Diastolic dysfunction by echo 3. Palpitations - occasionally she has some but tolerates  them well. Continue Diltiazem    Current medicines are reviewed at length with the patient today.  The patient does not have concerns regarding medicines.  The following changes have been made:  no change  Labs/ tests ordered today include: FLP and ALT  No orders of the defined types were placed in this encounter.     Disposition:   FU with me in 6 months   Signed, Elfriede Bonini R,  MD  08/22/2014 3:52 PM    [facility] [address] [place], [place], [place]  [address] Phone: [phone]; Fax: [phone]

## 2014-08-22 ENCOUNTER — Encounter: Payer: Self-pay | Admitting: Cardiology

## 2014-08-22 ENCOUNTER — Ambulatory Visit (INDEPENDENT_AMBULATORY_CARE_PROVIDER_SITE_OTHER): Payer: Managed Care, Other (non HMO) | Admitting: Cardiology

## 2014-08-22 ENCOUNTER — Encounter (HOSPITAL_COMMUNITY): Payer: Self-pay | Admitting: Emergency Medicine

## 2014-08-22 ENCOUNTER — Observation Stay (HOSPITAL_COMMUNITY)
Admission: EM | Admit: 2014-08-22 | Discharge: 2014-08-26 | Disposition: A | Discharge status: Home / Self Care | Payer: Managed Care, Other (non HMO) | Attending: Cardiovascular Disease | Admitting: Cardiovascular Disease

## 2014-08-22 ENCOUNTER — Emergency Department (HOSPITAL_COMMUNITY): Payer: Managed Care, Other (non HMO)

## 2014-08-22 VITALS — BP 120/90 | HR 96 | Ht 62.0 in | Wt 246.6 lb

## 2014-08-22 DIAGNOSIS — R61 Generalized hyperhidrosis: Secondary | ICD-10-CM | POA: Insufficient documentation

## 2014-08-22 DIAGNOSIS — Z79899 Other long term (current) drug therapy: Secondary | ICD-10-CM | POA: Diagnosis not present

## 2014-08-22 DIAGNOSIS — R0789 Other chest pain: Principal | ICD-10-CM | POA: Diagnosis present

## 2014-08-22 DIAGNOSIS — Z6841 Body Mass Index (BMI) 40.0 and over, adult: Secondary | ICD-10-CM | POA: Diagnosis not present

## 2014-08-22 DIAGNOSIS — R202 Paresthesia of skin: Secondary | ICD-10-CM

## 2014-08-22 DIAGNOSIS — E785 Hyperlipidemia, unspecified: Secondary | ICD-10-CM | POA: Diagnosis not present

## 2014-08-22 DIAGNOSIS — R200 Anesthesia of skin: Secondary | ICD-10-CM

## 2014-08-22 DIAGNOSIS — I2510 Atherosclerotic heart disease of native coronary artery without angina pectoris: Secondary | ICD-10-CM

## 2014-08-22 DIAGNOSIS — I10 Essential (primary) hypertension: Secondary | ICD-10-CM

## 2014-08-22 DIAGNOSIS — R079 Chest pain, unspecified: Secondary | ICD-10-CM

## 2014-08-22 DIAGNOSIS — Z7982 Long term (current) use of aspirin: Secondary | ICD-10-CM | POA: Insufficient documentation

## 2014-08-22 DIAGNOSIS — R110 Nausea: Secondary | ICD-10-CM | POA: Insufficient documentation

## 2014-08-22 DIAGNOSIS — M069 Rheumatoid arthritis, unspecified: Secondary | ICD-10-CM | POA: Diagnosis not present

## 2014-08-22 DIAGNOSIS — R931 Abnormal findings on diagnostic imaging of heart and coronary circulation: Secondary | ICD-10-CM | POA: Insufficient documentation

## 2014-08-22 DIAGNOSIS — R002 Palpitations: Secondary | ICD-10-CM | POA: Diagnosis not present

## 2014-08-22 DIAGNOSIS — F329 Major depressive disorder, single episode, unspecified: Secondary | ICD-10-CM | POA: Insufficient documentation

## 2014-08-22 DIAGNOSIS — I5189 Other ill-defined heart diseases: Secondary | ICD-10-CM | POA: Diagnosis present

## 2014-08-22 DIAGNOSIS — R6884 Jaw pain: Secondary | ICD-10-CM | POA: Insufficient documentation

## 2014-08-22 DIAGNOSIS — I5032 Chronic diastolic (congestive) heart failure: Secondary | ICD-10-CM | POA: Diagnosis not present

## 2014-08-22 DIAGNOSIS — R208 Other disturbances of skin sensation: Secondary | ICD-10-CM

## 2014-08-22 DIAGNOSIS — M542 Cervicalgia: Secondary | ICD-10-CM | POA: Diagnosis not present

## 2014-08-22 LAB — CBC
HCT: 44.1 % (ref 36.0–46.0)
HEMOGLOBIN: 14.4 g/dL (ref 12.0–15.0)
MCH: 27.6 pg (ref 26.0–34.0)
MCHC: 32.7 g/dL (ref 30.0–36.0)
MCV: 84.6 fL (ref 78.0–100.0)
PLATELETS: 303 10*3/uL (ref 150–400)
RBC: 5.21 MIL/uL — AB (ref 3.87–5.11)
RDW: 12.5 % (ref 11.5–15.5)
WBC: 11.2 10*3/uL — AB (ref 4.0–10.5)

## 2014-08-22 LAB — BASIC METABOLIC PANEL
ANION GAP: 6 (ref 5–15)
BUN: 16 mg/dL (ref 6–23)
CHLORIDE: 105 mmol/L (ref 96–112)
CO2: 27 mmol/L (ref 19–32)
Calcium: 9 mg/dL (ref 8.4–10.5)
Creatinine, Ser: 0.89 mg/dL (ref 0.50–1.10)
GFR calc Af Amer: 88 mL/min — ABNORMAL LOW (ref >90)
GFR calc non Af Amer: 76 mL/min — ABNORMAL LOW (ref >90)
GLUCOSE: 84 mg/dL (ref 70–99)
POTASSIUM: 3.9 mmol/L (ref 3.5–5.1)
Sodium: 138 mmol/L (ref 135–145)

## 2014-08-22 LAB — D-DIMER, QUANTITATIVE (NOT AT ARMC): D DIMER QUANT: 0.31 ug{FEU}/mL (ref 0.00–0.48)

## 2014-08-22 LAB — BRAIN NATRIURETIC PEPTIDE: B NATRIURETIC PEPTIDE 5: 11.2 pg/mL (ref 0.0–100.0)

## 2014-08-22 LAB — I-STAT TROPONIN, ED: Troponin i, poc: 0 ng/mL (ref 0.00–0.08)

## 2014-08-22 MED ORDER — NITROGLYCERIN 0.4 MG SL SUBL
0.4000 mg | SUBLINGUAL_TABLET | SUBLINGUAL | Status: DC | PRN
  Administered 2014-08-22: 0.4 mg via SUBLINGUAL
  Filled 2014-08-22: qty 1

## 2014-08-22 MED ORDER — MORPHINE SULFATE 4 MG/ML IJ SOLN
4.0000 mg | Freq: Once | INTRAMUSCULAR | Status: AC
  Administered 2014-08-22: 4 mg via INTRAVENOUS
  Filled 2014-08-22: qty 1

## 2014-08-22 MED ORDER — NITROGLYCERIN 2 % TD OINT
1.0000 [in_us] | TOPICAL_OINTMENT | Freq: Four times a day (QID) | TRANSDERMAL | Status: DC
  Administered 2014-08-22 – 2014-08-23 (×2): 1 [in_us] via TOPICAL
  Filled 2014-08-22: qty 1
  Filled 2014-08-22: qty 30
  Filled 2014-08-22: qty 1

## 2014-08-22 NOTE — ED Notes (Signed)
Patient asked for and received a Happy Meal and cranberry juice.

## 2014-08-22 NOTE — ED Notes (Addendum)
Pt from [facility] via [facility] with c/o central 8/10 chest tightness and heaviness radiating to left arm and left jaw/neck starting at noon today.  Pt was having a routine follow up and was sent here from the office.  Pt reports mild nausea and SOB, new swelling bilateral legs noted for the first time today.  12 lead unremarkable, given 324 mg aspirin.  Pt in NAD, A&O

## 2014-08-22 NOTE — H&P (Signed)
History and PHysical    Date:  08/22/2014   ID:  [name] [name], DOB 09-20-1965, MRN [id number]  PCP:  Birdena Jubilee, MD  Cardiologist:   Dietrich Pates, MD   Chief Complaint  Patient presents with  . Chest Pain      History of Present Illness:  [name] [name] is a 49 y.o. female with a history of diastolic dysfunction, RA, depression who presents back today. She initially saw me for atypical CP and cardiac workup with nuclear stress test and echo were normal with diastolic dysfunction on echo. A coronary CTA showed calcium score in the 95th% but no obstructive ASCAD (30% prox LAD stenosis). Today she started having pain in her chest that she describes as someone sitting on her chest.  It has been constant since 12noon but has been getting worse.  The pain is also in her jaw and neck and also tingles.  Her left arm is also tingling and feels asleep.  She is nauseated and feels like she wants to vomit.  She had some mild diaphoresis earlier.  She denies any fever or chills.  She had one bout of diarrhea when this started.     Past Medical History  Diagnosis Date  . Rheumatoid arthritis(714.0)   . Depression   . Morbid obesity   . HSV-1 infection   . Diastolic dysfunction   . Fibromyalgia   . Coronary artery calcification seen on CAT scan     Past Surgical History  Procedure Laterality Date  . Cesarean section    . Tubal ligation    . Laparoscopic gastric sleeve resection  11/21/12    [facility]     Current Facility-Administered Medications  Medication Dose Route Frequency Provider Last Rate Last Dose  . morphine 4 MG/ML injection 4 mg  4 mg Intravenous Once [place], PA-C      . nitroGLYCERIN (NITROGLYN) 2 % ointment 1 inch  1 inch Topical 4 times per day [place], PA-C      . nitroGLYCERIN (NITROSTAT) SL tablet 0.4 mg  0.4 mg Sublingual Q5 min PRN Oswaldo Conroy, PA-C   0.4 mg at 08/22/14 1820   Current Outpatient Prescriptions  Medication  Sig Dispense Refill  . Abatacept (ORENCIA IV) Inject 1,000 mg into the vein every 28 (twenty-eight) days.     . cetirizine (ZYRTEC) 10 MG tablet Take 1 tablet (10 mg total) by mouth daily. (Patient taking differently: Take 10 mg by mouth as needed for allergies. ) 100 tablet 3  . cyclobenzaprine (FLEXERIL) 10 MG tablet Take 10 mg by mouth 3 (three) times daily as needed for muscle spasms.    Marland Kitchen diltiazem (CARTIA XT) 180 MG 24 hr capsule Take 1 capsule (180 mg total) by mouth daily. (Patient taking differently: Take 180 mg by mouth at bedtime. ) 90 capsule 1  . DULoxetine (CYMBALTA) 60 MG capsule Take 60 mg by mouth 3 (three) times daily.    Marland Kitchen gabapentin (NEURONTIN) 100 MG capsule Take 100 mg by mouth 3 (three) times daily.     Marland Kitchen lidocaine (LIDODERM) 5 % Place 1 patch onto the skin as needed (for pain).     . methotrexate 25 MG/ML injection Inject 1 mL into the muscle every Tuesday.     . Vitamin D, Ergocalciferol, (DRISDOL) 50000 UNITS CAPS capsule Take 50,000 Units by mouth every Monday, Wednesday, and Friday.    . VOLTAREN 1 % GEL Apply 2 g topically daily as needed (for pain).     Marland Kitchen  Acyclovir-Hydrocortisone (XERESE) 5-1 % CREA Apply 1 application topically every 24 hours x 5 doses. 5 g 11  . benzonatate (TESSALON) 200 MG capsule Take 1 capsule (200 mg total) by mouth 3 (three) times daily as needed for cough. 60 capsule 0  . EPINEPHrine 0.3 mg/0.3 mL IJ SOAJ injection Inject 0.3 mLs (0.3 mg total) into the muscle once. 2 Device 11  . famciclovir (FAMVIR) 500 MG tablet Take three tablets at onset of fever blisters x 1 day 30 tablet 11  . fluticasone (FLONASE) 50 MCG/ACT nasal spray Place 2 sprays into both nostrils as needed for allergies. 16 g 11  . HYDROcodone-acetaminophen (NORCO/VICODIN) 5-325 MG per tablet Take 1 tablet by mouth every 6 (six) hours as needed for pain. 15 tablet 0  . hydrOXYzine (VISTARIL) 25 MG capsule Take 1 capsule BID prn for increased anxiety 60 capsule 3    Allergies:    Flu virus vaccine and Influenza vaccines    Social History:  The patient  reports that she has never smoked. She has never used smokeless tobacco. She reports that she does not drink alcohol or use illicit drugs.   Family History:  The patient's family history includes Hyperlipidemia in her mother; Lung disease in her father; Sarcoidosis in her father.    ROS:  Please see the history of present illness.   Otherwise, review of systems are positive for none.   All other systems are reviewed and negative.    PHYSICAL EXAM: VS:  BP 114/67 mmHg  Pulse 73  Temp(Src) 98.4 F (36.9 C) (Oral)  Resp 10  SpO2 100% , BMI There is no weight on file to calculate BMI. GEN: Well nourished, well developed, in no acute distress HEENT: normal Neck: no JVD, carotid bruits, or masses Cardiac: RRR; no murmurs, rubs, or gallops,no edema  Respiratory:  clear to auscultation bilaterally, normal work of breathing GI: soft, nontender, nondistended, + BS MS: no deformity or atrophy Skin: warm and dry, no rash Neuro:  Strength and sensation are intact Psych: euthymic mood, full affect   EKG:  EKG was ordered today and showed NSR at 96bpm with IRBBB and no acute ST changes    Recent Labs: 02/27/2014: ALT 16 08/22/2014: B Natriuretic Peptide 11.2; BUN 16; Creatinine 0.89; Hemoglobin 14.4; Platelets 303; Potassium 3.9; Sodium 138    Lipid Panel No results found for: CHOL, TRIG, HDL, CHOLHDL, VLDL, LDLCALC, LDLDIRECT    Wt Readings from Last 3 Encounters:  08/22/14 246 lb 9.6 oz (111.857 kg)  08/07/14 249 lb (112.946 kg)  06/26/14 244 lb (110.678 kg)    ASSESSMENT AND PLAN:   1. Atypical CP with  normal nuclear stress test and echo. Coronary CTA calcium Score 128 isolated to foci in proximal and mid LAD 98th percentile for age and sex matched controls with Less than 30% calcific disease in proximal and mid LAD. No obstructive CAD. Her CP has resolved.  Continue ASA 81mg  daily. She needs  aggressive risk factor modification. She was supposed to get her lipids checked last OV but did not come in to get it done.  Check FLP.        2.  Acute onset of chest pressure with left face tingling and numbness and left arm numbness that started around noon and has gotten progressively worse.  She is very uncomfortable rating her CP as an 8/10.  She is nauseated as well.  I will call EMS to transfer her to [facility] ER for further evaluation.  2. Diastolic dysfunction by echo 3. Palpitations - occasionally she has some but tolerates them well. Continue Diltiazem   Signed, Quintella Reichert, MD  08/22/2014 3:52 PM  [facility] [address] [place], [place], [place] [address] Phone: [phone]; Fax: [phone]    ADDENDUM  Patient seen in clinic earlier by T Turner  I discussed case with her .  I have examined patient  She is still uncomfortable with sl pressure  Some tingling on L side of face  D dimer neg  Initial trop negative. CXR negative'  I would admitt  R/O for MI  Rx for reflux though patient denies hx    CT scan was in Feb 2015  Myoview prior  GIven anxiety of patient and no explanation plus CAD by CT angio I would lean toward cath  Again reviewed with T Turner.  Will keep NPO after MN.     Signed, Dietrich Pates, MD  08/22/2014 8:35 PM    [facility] [address] [place], [place], [place]  [address] Phone: [phone]; Fax: [phone]

## 2014-08-22 NOTE — ED Notes (Signed)
Pt states that nitro she was given earlier did not help her chest pain, but did give her a headache.

## 2014-08-22 NOTE — ED Provider Notes (Signed)
CSN: [id number]     Arrival date & time 08/22/14  1648 History   First MD Initiated Contact with Patient 08/22/14 1652     Chief Complaint  Patient presents with  . Chest Pain     (Consider location/radiation/quality/duration/timing/severity/associated sxs/prior Treatment) HPI  [name] [name] is a 49 y.o. female with PMH of morbid obesity, diastolic dysfunction, coronary artery calcification, rheumatoid arthritis presenting with central chest tightness and heaviness radiating into left arm and left jaw since noon today that is constant. Patient was not her cardiologist's office who recommended that patient presented here. She reports mild nausea and vomiting is better with rest. She also reports new bilateral swelling. Patient has taken 324 make aspirin without any change in chest pain. Patient does endorse worsening chest pain and shortness of breath with exertion and deep breathing.  Recent unremarkable stress test in feb 2015 with echocardiogram. Pt denies history of DVT, PE, recent surgery or trauma, malignancy, hemoptysis, exogenous estrogen use, unilateral leg swelling or tenderness, immobilization.    Past Medical History  Diagnosis Date  . Rheumatoid arthritis(714.0)   . Depression   . Morbid obesity   . HSV-1 infection   . Diastolic dysfunction   . Fibromyalgia   . Coronary artery calcification seen on CAT scan    Past Surgical History  Procedure Laterality Date  . Cesarean section    . Tubal ligation    . Laparoscopic gastric sleeve resection  11/21/12    [facility]   Family History  Problem Relation Age of Onset  . Hyperlipidemia Mother   . Sarcoidosis Father   . Lung disease Father     Pleural Mesothelioma   History  Substance Use Topics  . Smoking status: Never Smoker   . Smokeless tobacco: Never Used  . Alcohol Use: No   OB History    No data available     Review of Systems 10 Systems reviewed and are negative for acute change except as noted in  the HPI.    Allergies  Flu virus vaccine and Influenza vaccines  Home Medications   Prior to Admission medications   Medication Sig Start Date End Date Taking? Authorizing Provider  Abatacept (ORENCIA IV) Inject 1,000 mg into the vein every 28 (twenty-eight) days.    Yes Historical Provider, MD  cetirizine (ZYRTEC) 10 MG tablet Take 1 tablet (10 mg total) by mouth daily. Patient taking differently: Take 10 mg by mouth as needed for allergies.  08/27/13 08/28/14 Yes [name], MD  cyclobenzaprine (FLEXERIL) 10 MG tablet Take 10 mg by mouth 3 (three) times daily as needed for muscle spasms.   Yes Historical Provider, MD  diltiazem (CARTIA XT) 180 MG 24 hr capsule Take 1 capsule (180 mg total) by mouth daily. Patient taking differently: Take 180 mg by mouth at bedtime.  07/26/14  Yes Quintella Reichert, MD  DULoxetine (CYMBALTA) 60 MG capsule Take 60 mg by mouth 3 (three) times daily. 01/03/14  Yes [name], MD  gabapentin (NEURONTIN) 100 MG capsule Take 100 mg by mouth 3 (three) times daily.    Yes Historical Provider, MD  lidocaine (LIDODERM) 5 % Place 1 patch onto the skin as needed (for pain).  06/04/13  Yes Historical Provider, MD  methotrexate 25 MG/ML injection Inject 1 mL into the muscle every Tuesday.  06/21/13  Yes Historical Provider, MD  Vitamin D, Ergocalciferol, (DRISDOL) 50000 UNITS CAPS capsule Take 50,000 Units by mouth every Monday, Wednesday, and Friday. 08/12/14  Yes Historical Provider, MD  VOLTAREN 1 % GEL Apply 2 g topically daily as needed (for pain).  05/02/13  Yes Historical Provider, MD  Acyclovir-Hydrocortisone (XERESE) 5-1 % CREA Apply 1 application topically every 24 hours x 5 doses. 01/03/14 01/04/15  [name], MD  benzonatate (TESSALON) 200 MG capsule Take 1 capsule (200 mg total) by mouth 3 (three) times daily as needed for cough. 01/03/14 01/04/15  [name], MD  EPINEPHrine 0.3 mg/0.3 mL IJ SOAJ injection Inject 0.3 mLs (0.3 mg total) into the muscle  once. 01/03/14 01/04/15  [name], MD  famciclovir (FAMVIR) 500 MG tablet Take three tablets at onset of fever blisters x 1 day 01/03/14   [name], MD  fluticasone (FLONASE) 50 MCG/ACT nasal spray Place 2 sprays into both nostrils as needed for allergies. 01/03/14 01/04/15  [name], MD  HYDROcodone-acetaminophen (NORCO/VICODIN) 5-325 MG per tablet Take 1 tablet by mouth every 6 (six) hours as needed for pain. 10/22/12   Charlestine Night, PA-C  hydrOXYzine (VISTARIL) 25 MG capsule Take 1 capsule BID prn for increased anxiety 01/03/14 01/03/15  [name], MD   BP 124/74 mmHg  Pulse 86  Temp(Src) 98.4 F (36.9 C) (Oral)  Resp 10  SpO2 99% Physical Exam  Constitutional: She appears well-developed and well-nourished. No distress.  HENT:  Head: Normocephalic and atraumatic.  Eyes: Conjunctivae and EOM are normal. Right eye exhibits no discharge. Left eye exhibits no discharge.  Neck: No JVD present.  Cardiovascular: Normal rate and regular rhythm.   No significant peripheral edema, erythema or tenderness. Appears to be equal bilaterally.   Pulmonary/Chest: Effort normal and breath sounds normal. No respiratory distress. She has no wheezes.  Abdominal: Soft. Bowel sounds are normal. She exhibits no distension. There is no tenderness.  Neurological: She is alert. She exhibits normal muscle tone. Coordination normal.  Skin: Skin is warm and dry. She is not diaphoretic.  Nursing note and vitals reviewed.   ED Course  Procedures (including critical care time) Labs Review Labs Reviewed  CBC - Abnormal; Notable for the following:    WBC 11.2 (*)    RBC 5.21 (*)    All other components within normal limits  BASIC METABOLIC PANEL - Abnormal; Notable for the following:    GFR calc non Af Amer 76 (*)    GFR calc Af Amer 88 (*)    All other components within normal limits  BRAIN NATRIURETIC PEPTIDE  D-DIMER, QUANTITATIVE  I-STAT TROPOININ, ED  POC URINE PREG, ED     Imaging Review Dg Chest 2 View  08/22/2014   CLINICAL DATA:  Chest pain and shortness of Breath  EXAM: CHEST  2 VIEW  COMPARISON:  08/29/2013  FINDINGS: Cardiac shadow is stable. The lungs are well aerated bilaterally. No focal infiltrate or sizable effusion is seen. No acute bony abnormality is noted.  IMPRESSION: No acute abnormality noted.   Electronically Signed   By: Alcide Clever M.D.   On: 08/22/2014 19:59     EKG Interpretation   Date/Time:  Thursday August 22 2014 16:54:45 EDT Ventricular Rate:  80 PR Interval:  113 QRS Duration: 92 QT Interval:  380 QTC Calculation: 438 R Axis:   89 Text Interpretation:  Sinus rhythm Borderline short PR interval Low  voltage, precordial leads Consider right ventricular hypertrophy ST elev,  probable normal early repol pattern since last tracing no significant  change Confirmed by [facility]  MD, ELLIOTT [phone]) on 08/22/2014 4:57:51  PM      MDM   Final diagnoses:  Chest pain, unspecified chest pain type   Patient presenting with chest pain that is exertional and associated with shortness of breath that is on relieved by aspirin and nitroglycerin. Patient with history of CAD. VSS. Regular rate and rhythm lungs clear to auscultation bilaterally. Patient low risk for DVT with negative d-dimer. EKG without significant change and initial troponin negative. Chest x-ray unremarkable. Concern for possible cardiac nature and patient sent from cardiology office. Pt still in pain. Consult to cardiology. Spoke with Dr. Tenny Craw who would discuss the case with Dr. Mayford Knife with the plan to evaluate the patient with the plan for admission for further work up  Discussed return precautions with patient. Discussed all results and patient verbalizes understanding and agrees with plan.  Case has been discussed with Dr. Effie Shy who agrees with the above plan .      Oswaldo Conroy, PA-C 08/22/14 2304  Mancel Bale, MD 08/22/14 2352

## 2014-08-23 DIAGNOSIS — I5032 Chronic diastolic (congestive) heart failure: Secondary | ICD-10-CM

## 2014-08-23 DIAGNOSIS — I209 Angina pectoris, unspecified: Secondary | ICD-10-CM

## 2014-08-23 DIAGNOSIS — I10 Essential (primary) hypertension: Secondary | ICD-10-CM

## 2014-08-23 LAB — TROPONIN I
Troponin I: <0.03 ng/mL (ref <0.031)
Troponin I: <0.03 ng/mL (ref <0.031)
Troponin I: <0.03 ng/mL (ref <0.031)

## 2014-08-23 LAB — CBC
HCT: 42.4 % (ref 36.0–46.0)
HEMOGLOBIN: 13.8 g/dL (ref 12.0–15.0)
MCH: 27.5 pg (ref 26.0–34.0)
MCHC: 32.5 g/dL (ref 30.0–36.0)
MCV: 84.5 fL (ref 78.0–100.0)
PLATELETS: 286 10*3/uL (ref 150–400)
RBC: 5.02 MIL/uL (ref 3.87–5.11)
RDW: 12.6 % (ref 11.5–15.5)
WBC: 10.6 10*3/uL — AB (ref 4.0–10.5)

## 2014-08-23 LAB — BASIC METABOLIC PANEL
ANION GAP: 6 (ref 5–15)
BUN: 12 mg/dL (ref 6–23)
CO2: 25 mmol/L (ref 19–32)
CREATININE: 0.76 mg/dL (ref 0.50–1.10)
Calcium: 9 mg/dL (ref 8.4–10.5)
Chloride: 109 mmol/L (ref 96–112)
Glucose, Bld: 82 mg/dL (ref 70–99)
Potassium: 3.9 mmol/L (ref 3.5–5.1)
Sodium: 140 mmol/L (ref 135–145)

## 2014-08-23 LAB — CREATININE, SERUM
Creatinine, Ser: 0.87 mg/dL (ref 0.50–1.10)
GFR calc Af Amer: >90 mL/min (ref >90)
GFR calc non Af Amer: 78 mL/min — ABNORMAL LOW (ref >90)

## 2014-08-23 LAB — LIPID PANEL
CHOLESTEROL: 182 mg/dL (ref 0–200)
HDL: 49 mg/dL (ref >39)
LDL Cholesterol: 121 mg/dL — ABNORMAL HIGH (ref 0–99)
Total CHOL/HDL Ratio: 3.7 RATIO
Triglycerides: 62 mg/dL (ref <150)
VLDL: 12 mg/dL (ref 0–40)

## 2014-08-23 LAB — PROTIME-INR
INR: 1.11 (ref 0.00–1.49)
Prothrombin Time: 14.4 seconds (ref 11.6–15.2)

## 2014-08-23 LAB — MRSA PCR SCREENING: MRSA BY PCR: NEGATIVE

## 2014-08-23 MED ORDER — HYDROCODONE-ACETAMINOPHEN 5-325 MG PO TABS
1.0000 | ORAL_TABLET | Freq: Four times a day (QID) | ORAL | Status: DC | PRN
Start: 2014-08-23 — End: 2014-08-26
  Administered 2014-08-23 – 2014-08-26 (×6): 1 via ORAL
  Filled 2014-08-23 (×5): qty 1

## 2014-08-23 MED ORDER — ATORVASTATIN CALCIUM 80 MG PO TABS
80.0000 mg | ORAL_TABLET | Freq: Every day | ORAL | Status: DC
Start: 2014-08-23 — End: 2014-08-26
  Administered 2014-08-23 – 2014-08-26 (×4): 80 mg via ORAL
  Filled 2014-08-23 (×4): qty 1

## 2014-08-23 MED ORDER — SODIUM CHLORIDE 0.9 % IV SOLN: 250.0000 mL | INTRAVENOUS | Status: DC | PRN

## 2014-08-23 MED ORDER — BENZONATATE 100 MG PO CAPS: 200.0000 mg | ORAL_CAPSULE | Freq: Three times a day (TID) | ORAL | Status: DC | PRN

## 2014-08-23 MED ORDER — ASPIRIN 81 MG PO CHEW: 324.0000 mg | CHEWABLE_TABLET | ORAL | Status: DC

## 2014-08-23 MED ORDER — DILTIAZEM HCL ER COATED BEADS 180 MG PO CP24
180.0000 mg | ORAL_CAPSULE | Freq: Every day | ORAL | Status: DC
  Administered 2014-08-23 – 2014-08-25 (×4): 180 mg via ORAL
  Filled 2014-08-23 (×5): qty 1

## 2014-08-23 MED ORDER — HEPARIN SODIUM (PORCINE) 5000 UNIT/ML IJ SOLN
5000.0000 [IU] | Freq: Three times a day (TID) | INTRAMUSCULAR | Status: DC
  Administered 2014-08-23 – 2014-08-26 (×9): 5000 [IU] via SUBCUTANEOUS
  Filled 2014-08-23 (×11): qty 1

## 2014-08-23 MED ORDER — MORPHINE SULFATE 2 MG/ML IJ SOLN
1.0000 mg | INTRAMUSCULAR | Status: DC | PRN
  Administered 2014-08-23: 1 mg via INTRAVENOUS
  Filled 2014-08-23: qty 1

## 2014-08-23 MED ORDER — PANTOPRAZOLE SODIUM 40 MG PO TBEC
40.0000 mg | DELAYED_RELEASE_TABLET | Freq: Every day | ORAL | Status: DC
  Administered 2014-08-23 – 2014-08-26 (×4): 40 mg via ORAL
  Filled 2014-08-23 (×4): qty 1

## 2014-08-23 MED ORDER — VITAMIN D (ERGOCALCIFEROL) 1.25 MG (50000 UNIT) PO CAPS
50000.0000 [IU] | ORAL_CAPSULE | ORAL | Status: DC
  Administered 2014-08-23 – 2014-08-26 (×2): 50000 [IU] via ORAL
  Filled 2014-08-23 (×2): qty 1

## 2014-08-23 MED ORDER — FLUTICASONE PROPIONATE 50 MCG/ACT NA SUSP: 2.0000 | Freq: Every day | NASAL | Status: DC | PRN

## 2014-08-23 MED ORDER — DULOXETINE HCL 60 MG PO CPEP
60.0000 mg | ORAL_CAPSULE | Freq: Three times a day (TID) | ORAL | Status: DC
  Administered 2014-08-23 – 2014-08-26 (×11): 60 mg via ORAL
  Filled 2014-08-23 (×12): qty 1

## 2014-08-23 MED ORDER — ASPIRIN EC 81 MG PO TBEC
81.0000 mg | DELAYED_RELEASE_TABLET | Freq: Every day | ORAL | Status: DC
  Administered 2014-08-24 – 2014-08-25 (×2): 81 mg via ORAL
  Filled 2014-08-23 (×2): qty 1

## 2014-08-23 MED ORDER — SODIUM CHLORIDE 0.9 % IJ SOLN: 3.0000 mL | INTRAMUSCULAR | Status: DC | PRN

## 2014-08-23 MED ORDER — HYDROXYZINE HCL 25 MG PO TABS
25.0000 mg | ORAL_TABLET | Freq: Four times a day (QID) | ORAL | Status: DC | PRN
Start: 2014-08-23 — End: 2014-08-26

## 2014-08-23 MED ORDER — ONDANSETRON HCL 4 MG/2ML IJ SOLN
4.0000 mg | Freq: Four times a day (QID) | INTRAMUSCULAR | Status: DC | PRN
  Administered 2014-08-23 – 2014-08-24 (×2): 4 mg via INTRAVENOUS
  Filled 2014-08-23 (×2): qty 2

## 2014-08-23 MED ORDER — SODIUM CHLORIDE 0.9 % IV SOLN: INTRAVENOUS | Status: DC

## 2014-08-23 MED ORDER — GABAPENTIN 100 MG PO CAPS
100.0000 mg | ORAL_CAPSULE | Freq: Three times a day (TID) | ORAL | Status: DC
  Administered 2014-08-23 – 2014-08-26 (×12): 100 mg via ORAL
  Filled 2014-08-23 (×13): qty 1

## 2014-08-23 MED ORDER — METOPROLOL SUCCINATE ER 25 MG PO TB24
25.0000 mg | ORAL_TABLET | Freq: Every day | ORAL | Status: DC
  Administered 2014-08-23 – 2014-08-26 (×4): 25 mg via ORAL
  Filled 2014-08-23 (×4): qty 1

## 2014-08-23 MED ORDER — ASPIRIN 300 MG RE SUPP: 300.0000 mg | RECTAL | Status: DC

## 2014-08-23 MED ORDER — SODIUM CHLORIDE 0.9 % IV SOLN
INTRAVENOUS | Status: DC
  Administered 2014-08-23: 13:00:00 via INTRAVENOUS

## 2014-08-23 MED ORDER — SODIUM CHLORIDE 0.9 % IJ SOLN
3.0000 mL | Freq: Two times a day (BID) | INTRAMUSCULAR | Status: DC
  Administered 2014-08-23 – 2014-08-25 (×7): 3 mL via INTRAVENOUS

## 2014-08-23 MED ORDER — ACETAMINOPHEN 325 MG PO TABS
650.0000 mg | ORAL_TABLET | ORAL | Status: DC | PRN
  Administered 2014-08-23 – 2014-08-24 (×2): 650 mg via ORAL
  Filled 2014-08-23 (×2): qty 2

## 2014-08-23 MED ORDER — DICLOFENAC SODIUM 1 % TD GEL: 2.0000 g | Freq: Every day | TRANSDERMAL | Status: DC | PRN

## 2014-08-23 MED ORDER — SODIUM CHLORIDE 0.9 % IJ SOLN
3.0000 mL | Freq: Two times a day (BID) | INTRAMUSCULAR | Status: DC
Start: 2014-08-23 — End: 2014-08-23

## 2014-08-23 MED ORDER — CYCLOBENZAPRINE HCL 10 MG PO TABS: 10.0000 mg | ORAL_TABLET | Freq: Three times a day (TID) | ORAL | Status: DC | PRN

## 2014-08-23 MED ORDER — ASPIRIN 81 MG PO CHEW: 81.0000 mg | CHEWABLE_TABLET | ORAL | Status: DC

## 2014-08-23 MED ORDER — LORATADINE 10 MG PO TABS
10.0000 mg | ORAL_TABLET | Freq: Every day | ORAL | Status: DC | PRN
  Filled 2014-08-23: qty 1

## 2014-08-23 MED ORDER — NITROGLYCERIN 0.4 MG SL SUBL: 0.4000 mg | SUBLINGUAL_TABLET | SUBLINGUAL | Status: DC | PRN

## 2014-08-23 NOTE — Progress Notes (Signed)
Patient Name: [name] [name] Date of Encounter: 08/23/2014  Active Problems:   Chest pressure   Chest pain   Length of Stay: 1  SUBJECTIVE  The patient continues to have retrosternal 6/10 pressure like chest pain and headache sec to nitro pill.   CURRENT MEDS . [START ON 08/24/2014] aspirin EC  81 mg Oral Daily  . diltiazem  180 mg Oral QHS  . DULoxetine  60 mg Oral TID  . gabapentin  100 mg Oral TID  . heparin  5,000 Units Subcutaneous 3 times per day  . nitroGLYCERIN  1 inch Topical 4 times per day  . pantoprazole  40 mg Oral Q1200  . sodium chloride  3 mL Intravenous Q12H  . Vitamin D (Ergocalciferol)  50,000 Units Oral Q M,W,F    OBJECTIVE  Filed Vitals:   08/23/14 0100 08/23/14 0121 08/23/14 0300 08/23/14 0749  BP: 122/71  114/64 119/47  Pulse: 75  79 72  Temp:   97.5 F (36.4 C) 97.8 F (36.6 C)  TempSrc:   Oral Oral  Resp: 25  22 24   Height:      Weight:  247 lb 12.8 oz (112.4 kg)    SpO2: 99%  99% 100%    Intake/Output Summary (Last 24 hours) at 08/23/14 0804 Last data filed at 08/23/14 0053  Gross per 24 hour  Intake      3 ml  Output      0 ml  Net      3 ml   Filed Weights   08/23/14 0000 08/23/14 0121  Weight: 247 lb 12.8 oz (112.4 kg) 247 lb 12.8 oz (112.4 kg)    PHYSICAL EXAM  General: Pleasant, NAD. Neuro: Alert and oriented X 3. Moves all extremities spontaneously. Psych: Normal affect. HEENT:  Normal  Neck: Supple without bruits or JVD. Lungs:  Resp regular and unlabored, CTA. Heart: RRR no s3, s4, or murmurs. Abdomen: Soft, non-tender, non-distended, BS + x 4.  Extremities: No clubbing, cyanosis or edema. DP/PT/Radials 2+ and equal bilaterally.  Accessory Clinical Findings  CBC  Recent Labs  08/22/14 1705 08/23/14 0057  WBC 11.2* 10.6*  HGB 14.4 13.8  HCT 44.1 42.4  MCV 84.6 84.5  PLT 303 286   Basic Metabolic Panel  Recent Labs  08/22/14 1705 08/23/14 0057 08/23/14 0610  NA 138  --  140  K 3.9  --  3.9   CL 105  --  109  CO2 27  --  25  GLUCOSE 84  --  82  BUN 16  --  12  CREATININE 0.89 0.87 0.76  CALCIUM 9.0  --  9.0   Liver Function Tests No results for input(s): AST, ALT, ALKPHOS, BILITOT, PROT, ALBUMIN in the last 72 hours. No results for input(s): LIPASE, AMYLASE in the last 72 hours. Cardiac Enzymes  Recent Labs  08/23/14 0057 08/23/14 0610  TROPONINI <0.03 <0.03   BNP Invalid input(s): POCBNP D-Dimer  Recent Labs  08/22/14 1705  DDIMER 0.31   Hemoglobin A1C No results for input(s): HGBA1C in the last 72 hours. Fasting Lipid Panel  Recent Labs  08/23/14 0250  CHOL 182  HDL 49  LDLCALC 121*  TRIG 62  CHOLHDL 3.7   Dg Chest 2 View  08/22/2014   CLINICAL DATA:  Chest pain and shortness of Breath  EXAM: CHEST  2 VIEW  COMPARISON:  08/29/2013  FINDINGS: Cardiac shadow is stable. The lungs are well aerated bilaterally. No focal infiltrate or  sizable effusion is seen. No acute bony abnormality is noted.  IMPRESSION: No acute abnormality noted.   Electronically Signed   By: Alcide Clever M.D.   On: 08/22/2014 19:59    TELE  ECG: SR, possible RVH  Echo: 05/10/2013 - Left ventricle: The cavity size was normal. Wall thickness was normal. Systolic function was normal. The estimated ejection fraction was in the range of 50% to 55%. Doppler parameters are consistent with abnormal left ventricular relaxation (grade 1 diastolic dysfunction). Doppler parameters are consistent with high ventricular filling pressure. - Left atrium: The atrium was mildly dilated. Otherwise normal.    ASSESSMENT AND PLAN  [name] [name] is a 49 y.o. female with a history of diastolic dysfunction, RA, depression who presents back yesterday. She had cardiac workup with nuclear stress test and echo were normal with diastolic dysfunction and elevated filling pressures on echo in 2014. A coronary CTA showed calcium score in the 95th% but no obstructive ASCAD (30% prox LAD  stenosis). Today she started having pain in her chest that she describes as someone sitting on her chest. It has been constant since 12 noon but has been getting worse. The pain is also in her jaw and neck and also tingles. Her left arm is also tingling and feels asleep. She is nauseated and feels like she wants to vomit. She had some mild diaphoresis earlier. She denies any fever or chills. She had one bout of diarrhea when this started.   1. Atypical CP with normal nuclear stress test and echo in 2014. Coronary CTA calcium Score 128 isolated to foci in proximal and mid LAD 98th percentile for age and sex matched controls with Less than 30% calcific disease in proximal and mid LAD. No obstructive CAD. Troponin is negative x 3, ECG not suggestive of ischemia.  The patient continues to have chest pain and this is recurrent admission for chest pain, I think that we should cath her to get the answer and potentially avoid admission in the future.  - Continue ASA 81mg  daily. Start atorvastatin 80 mg po QHS  2. Diastolic dysfunction by echo - elevated diastolic BP, we will add Toprol 25 mg po daily. 3. Palpitations - occasionally she has some but tolerates them well. Continue Diltiazem, add metoprolol  NPO and left cardiac cath today.  Signed, MD, [facility] 08/23/2014

## 2014-08-23 NOTE — Care Management Note (Signed)
    Page 1 of 1   08/23/2014     12:09:48 PM CARE MANAGEMENT NOTE 08/23/2014  Patient:  [name] [name], [name] [name]   Account Number:  [id number]  Date Initiated:  08/23/2014  Documentation initiated by:  Junius Creamer  Subjective/Objective Assessment:   adm w ch pain persistent     Action/Plan:   lives w fam, pcp dr Zella Ball zanard   Anticipated DC Date:  08/24/2014   Anticipated DC Plan:  HOME/SELF CARE         Choice offered to / List presented to:             Status of service:   Medicare Important Message given?   (If response is "NO", the following Medicare IM given date fields will be blank) Date Medicare IM given:   Medicare IM given by:   Date Additional Medicare IM given:   Additional Medicare IM given by:    Discharge Disposition:    Per UR Regulation:  Reviewed for med. necessity/level of care/duration of stay  If discussed at Long Length of Stay Meetings, dates discussed:    Comments:

## 2014-08-24 ENCOUNTER — Observation Stay (HOSPITAL_COMMUNITY): Payer: Managed Care, Other (non HMO)

## 2014-08-24 DIAGNOSIS — I2510 Atherosclerotic heart disease of native coronary artery without angina pectoris: Secondary | ICD-10-CM | POA: Diagnosis present

## 2014-08-24 DIAGNOSIS — R200 Anesthesia of skin: Secondary | ICD-10-CM

## 2014-08-24 DIAGNOSIS — I25110 Atherosclerotic heart disease of native coronary artery with unstable angina pectoris: Secondary | ICD-10-CM

## 2014-08-24 DIAGNOSIS — R202 Paresthesia of skin: Secondary | ICD-10-CM

## 2014-08-24 NOTE — Progress Notes (Signed)
UR completed 

## 2014-08-24 NOTE — Progress Notes (Signed)
SUBJECTIVE:  Still with some CP and numbness of face and arm have returned  OBJECTIVE:   Vitals:   Filed Vitals:   08/24/14 0500 08/24/14 0545 08/24/14 0815 08/24/14 0956  BP: 119/76  117/73 110/63  Pulse: 76  72 69  Temp:   97.8 F (36.6 C)   TempSrc: Oral  Oral   Resp:  16 17   Height: 5\' 2"  (1.575 m)     Weight: 244 lb 3.2 oz (110.768 kg)     SpO2: 100%  99%    I&O's:   Intake/Output Summary (Last 24 hours) at 08/24/14 1030 Last data filed at 08/24/14 0835  Gross per 24 hour  Intake    743 ml  Output      0 ml  Net    743 ml   TELEMETRY: Reviewed telemetry pt in NSR:     PHYSICAL EXAM General: Well developed, well nourished, in no acute distress Head: Eyes PERRLA, No xanthomas.   Normal cephalic and atramatic  Lungs:   Clear bilaterally to auscultation and percussion. Heart:   HRRR S1 S2 Pulses are 2+ & equal. Abdomen: Bowel sounds are positive, abdomen soft and non-tender without masses Extremities:   No clubbing, cyanosis or edema.  DP +1 Neuro: Alert and oriented X 3. Psych:  Good affect, responds appropriately   LABS: Basic Metabolic Panel:  Recent Labs  08/26/14 1705 08/23/14 0057 08/23/14 0610  NA 138  --  140  K 3.9  --  3.9  CL 105  --  109  CO2 27  --  25  GLUCOSE 84  --  82  BUN 16  --  12  CREATININE 0.89 0.87 0.76  CALCIUM 9.0  --  9.0   Liver Function Tests: No results for input(s): AST, ALT, ALKPHOS, BILITOT, PROT, ALBUMIN in the last 72 hours. No results for input(s): LIPASE, AMYLASE in the last 72 hours. CBC:  Recent Labs  08/22/14 1705 08/23/14 0057  WBC 11.2* 10.6*  HGB 14.4 13.8  HCT 44.1 42.4  MCV 84.6 84.5  PLT 303 286   Cardiac Enzymes:  Recent Labs  08/23/14 0057 08/23/14 0610 08/23/14 1142  TROPONINI <0.03 <0.03 <0.03   BNP: Invalid input(s): POCBNP D-Dimer:  Recent Labs  08/22/14 1705  DDIMER 0.31   Hemoglobin A1C: No results for input(s): HGBA1C in the last 72 hours. Fasting Lipid  Panel:  Recent Labs  08/23/14 0250  CHOL 182  HDL 49  LDLCALC 121*  TRIG 62  CHOLHDL 3.7   Thyroid Function Tests: No results for input(s): TSH, T4TOTAL, T3FREE, THYROIDAB in the last 72 hours.  Invalid input(s): FREET3 Anemia Panel: No results for input(s): VITAMINB12, FOLATE, FERRITIN, TIBC, IRON, RETICCTPCT in the last 72 hours. Coag Panel:   Lab Results  Component Value Date   INR 1.11 08/23/2014    RADIOLOGY: Dg Chest 2 View  08/22/2014   CLINICAL DATA:  Chest pain and shortness of Breath  EXAM: CHEST  2 VIEW  COMPARISON:  08/29/2013  FINDINGS: Cardiac shadow is stable. The lungs are well aerated bilaterally. No focal infiltrate or sizable effusion is seen. No acute bony abnormality is noted.  IMPRESSION: No acute abnormality noted.   Electronically Signed   By: 08/31/2013 M.D.   On: 08/22/2014 19:59    ASSESSMENT AND PLAN  [name] [name] is a 49 y.o. female with a history of diastolic dysfunction, RA, depression who presents back yesterday. She had cardiac workup with nuclear stress  test and echo were normal with diastolic dysfunction and elevated filling pressures on echo in 2014. A coronary CTA showed calcium score in the 95th% but no obstructive ASCAD (30% prox LAD stenosis). Seen in clinic Friday after she started having pain in her chest that she describes as someone sitting on her chest. It has been constant since  but has been getting worse. The pain is also in her jaw and neck and also tingles. Her left arm is also tingling and feels asleep. She is nauseated and feels like she wants to vomit She denies any fever or chills. She had one bout of diarrhea when this started.   1. Atypical CP with normal nuclear stress test and echo in 2014. Coronary CTA calcium Score 128 isolated to foci in proximal and mid LAD 98th percentile for age and sex matched controls with Less than 30% calcific disease in proximal and mid LAD. No obstructive CAD. Troponin is  negative x 3, ECG not suggestive of ischemia.  The patient continues to have chest pain and this is recurrent admission for chest pain, I think that we should cath her to get the answer and potentially avoid admission in the future.  - Continue ASA/high dose statin.  CP is better but still present.  Could not due cath yesterday due to scheduling issues and will be done Monday. 2. Diastolic dysfunction by echo - elevated diastolic BP.  BB added 3. Palpitations - occasionally she has some but tolerates them well. Continue Diltiazem/BB 4.  Numbness and tingling in left face and arm.  This resolved yesterday but then reoccurred.  Will head CT. 5.  Anxiety 6.  N/V after getting pain meds - resolved    Quintella Reichert, MD  08/24/2014  10:30 AM

## 2014-08-25 DIAGNOSIS — I10 Essential (primary) hypertension: Secondary | ICD-10-CM

## 2014-08-25 DIAGNOSIS — I2510 Atherosclerotic heart disease of native coronary artery without angina pectoris: Secondary | ICD-10-CM

## 2014-08-25 MED ORDER — SODIUM CHLORIDE 0.9 % IV SOLN
1.0000 mL/kg/h | INTRAVENOUS | Status: DC
  Administered 2014-08-26: 1 mL/kg/h via INTRAVENOUS

## 2014-08-25 MED ORDER — ASPIRIN 81 MG PO CHEW
81.0000 mg | CHEWABLE_TABLET | ORAL | Status: AC
  Administered 2014-08-26: 81 mg via ORAL
  Filled 2014-08-25: qty 1

## 2014-08-25 MED ORDER — SODIUM CHLORIDE 0.9 % IJ SOLN: 3.0000 mL | Freq: Two times a day (BID) | INTRAMUSCULAR | Status: DC

## 2014-08-25 MED ORDER — SODIUM CHLORIDE 0.9 % IJ SOLN: 3.0000 mL | INTRAMUSCULAR | Status: DC | PRN

## 2014-08-25 MED ORDER — SODIUM CHLORIDE 0.9 % IV SOLN: 250.0000 mL | INTRAVENOUS | Status: DC | PRN

## 2014-08-25 NOTE — Progress Notes (Signed)
UR completed 

## 2014-08-25 NOTE — Progress Notes (Signed)
Patient Name: [name] [name] Date of Encounter: 08/25/2014   Principal Problem:   Chest pain, atypical Active Problems:   Coronary artery calcification seen on CAT scan   CAD (coronary artery disease), native coronary artery   Diastolic dysfunction   Essential hypertension   Morbid obesity   Heart palpitations   Numbness and tingling in left arm   SUBJECTIVE  Chest pain less pronounced but ongoing - 5/10 currently - dozing on and off during interview.  CURRENT MEDS . aspirin EC  81 mg Oral Daily  . atorvastatin  80 mg Oral q1800  . diltiazem  180 mg Oral QHS  . DULoxetine  60 mg Oral TID  . gabapentin  100 mg Oral TID  . heparin  5,000 Units Subcutaneous 3 times per day  . metoprolol succinate  25 mg Oral Daily  . pantoprazole  40 mg Oral Q1200  . sodium chloride  3 mL Intravenous Q12H  . Vitamin D (Ergocalciferol)  50,000 Units Oral Q M,W,F    OBJECTIVE  Filed Vitals:   08/24/14 2140 08/25/14 0011 08/25/14 0400 08/25/14 0828  BP: 111/75 104/81 114/78 107/59  Pulse:  94 60 70  Temp:  98.4 F (36.9 C) 98.2 F (36.8 C) 97.7 F (36.5 C)  TempSrc:    Oral  Resp:  16 18 18   Height:      Weight:   244 lb (110.678 kg)   SpO2:  99% 99% 98%    Intake/Output Summary (Last 24 hours) at 08/25/14 0944 Last data filed at 08/25/14 0700  Gross per 24 hour  Intake    840 ml  Output      0 ml  Net    840 ml   Filed Weights   08/23/14 1152 08/24/14 0500 08/25/14 0400  Weight: 246 lb 14.6 oz (112 kg) 244 lb 3.2 oz (110.768 kg) 244 lb (110.678 kg)    PHYSICAL EXAM  General: Pleasant, NAD. Neuro: Alert and oriented X 3. Moves all extremities spontaneously. Psych: Normal affect. HEENT:  Normal  Neck: Supple without bruits.  Obese, difficult to assess jvp. Lungs:  Resp regular and unlabored, CTA. Heart: RRR no s3, s4, or murmurs. Abdomen: Soft, non-tender, non-distended, BS + x 4.  Extremities: No clubbing, cyanosis or edema. DP/PT/Radials 2+ and equal  bilaterally.  Accessory Clinical Findings  CBC  Recent Labs  08/22/14 1705 08/23/14 0057  WBC 11.2* 10.6*  HGB 14.4 13.8  HCT 44.1 42.4  MCV 84.6 84.5  PLT 303 286   Basic Metabolic Panel  Recent Labs  08/22/14 1705 08/23/14 0057 08/23/14 0610  NA 138  --  140  K 3.9  --  3.9  CL 105  --  109  CO2 27  --  25  GLUCOSE 84  --  82  BUN 16  --  12  CREATININE 0.89 0.87 0.76  CALCIUM 9.0  --  9.0   Cardiac Enzymes  Recent Labs  08/23/14 0057 08/23/14 0610 08/23/14 1142  TROPONINI <0.03 <0.03 <0.03   D-Dimer  Recent Labs  08/22/14 1705  DDIMER 0.31   Fasting Lipid Panel  Recent Labs  08/23/14 0250  CHOL 182  HDL 49  LDLCALC 121*  TRIG 62  CHOLHDL 3.7   TELE  RSR  ECG  Rsr, 65, no acute st/t changes.  Radiology/Studies  Dg Chest 2 View  08/22/2014   CLINICAL DATA:  Chest pain and shortness of Breath  EXAM: CHEST  2 VIEW  COMPARISON:  08/29/2013  FINDINGS: Cardiac shadow is stable. The lungs are well aerated bilaterally. No focal infiltrate or sizable effusion is seen. No acute bony abnormality is noted.  IMPRESSION: No acute abnormality noted.   Electronically Signed   By: Alcide Clever M.D.   On: 08/22/2014 19:59   Ct Head Wo Contrast  08/24/2014   CLINICAL DATA:  Numbness and tingling in left arm. Left facial numbness.  EXAM: CT HEAD WITHOUT CONTRAST  TECHNIQUE: Contiguous axial images were obtained from the base of the skull through the vertex without intravenous contrast.  COMPARISON:  Head CT 05/08/2012  FINDINGS: No acute intracranial hemorrhage. No focal mass lesion. No CT evidence of acute infarction. No midline shift or mass effect. No hydrocephalus. Basilar cisterns are patent. Paranasal sinuses and mastoid air cells are clear.  IMPRESSION: Normal head CT.   Electronically Signed   By: Genevive Bi M.D.   On: 08/24/2014 12:05    ASSESSMENT AND PLAN  [name] [name] is a 49 y.o. female with a history of diastolic dysfunction, RA,  depression, who presented back to [facility] on 3/17. She had cardiac workup with nuclear stress test and echo were normal with diastolic dysfunction and elevated filling pressures on echo in 2014. A coronary CTA showed calcium score in the 95th% but no obstructive ASCAD (30% prox LAD stenosis). Seen in clinic Friday 3/18 after she started having pain in her chest that she describes as someone sitting on her chest.  1.  Atypical Chest Pain/Coronary Calcification on CT:  Pt cont to have constant chest pain, now 5/10.  Looks comfortable.  CE neg/no objective evidence of ischemia thus far despite c/p since Friday.  Given ongoing Ss and repeat admissions, plan for cath tomorrow.  The patient understands that risks include but are not limited to stroke (1 in 1000), death (1 in 1000), kidney failure [usually temporary] (1 in 500), bleeding (1 in 200), allergic reaction [possibly serious] (1 in 200), and agrees to proceed.  Cont asa, statin, bb.  2.  Chronic diast CHF:  Euvolemic.  HR/BP stable.  Cont bb/dilt.  3.  HTN:  Stable.  4.  HL:  LDL 121.  Cont statin.  5.  Palpitations:  No events on tele.  Cont bb/dilt.  6.  Numbness/tingling in left face/arm:  Resolved.  Head CT negative.  Signed, Nicolasa Ducking NP

## 2014-08-26 ENCOUNTER — Encounter (HOSPITAL_COMMUNITY)
Admission: EM | Disposition: A | Discharge status: Home / Self Care | Payer: Managed Care, Other (non HMO) | Source: Home / Self Care | Attending: Emergency Medicine

## 2014-08-26 DIAGNOSIS — R0789 Other chest pain: Secondary | ICD-10-CM

## 2014-08-26 HISTORY — PX: LEFT HEART CATHETERIZATION WITH CORONARY ANGIOGRAM: SHX5451

## 2014-08-26 LAB — PREGNANCY, URINE: Preg Test, Ur: NEGATIVE

## 2014-08-26 SURGERY — LEFT HEART CATHETERIZATION WITH CORONARY ANGIOGRAM
Anesthesia: LOCAL

## 2014-08-26 MED ORDER — HEPARIN (PORCINE) IN NACL 2-0.9 UNIT/ML-% IJ SOLN
INTRAMUSCULAR | Status: AC
  Filled 2014-08-26: qty 1000

## 2014-08-26 MED ORDER — SODIUM CHLORIDE 0.9 % IV SOLN
INTRAVENOUS | Status: AC
  Administered 2014-08-26: 13:00:00 via INTRAVENOUS

## 2014-08-26 MED ORDER — ACETAMINOPHEN 325 MG PO TABS: 650.0000 mg | ORAL_TABLET | ORAL | Status: DC | PRN

## 2014-08-26 MED ORDER — HYDROCODONE-ACETAMINOPHEN 5-325 MG PO TABS
ORAL_TABLET | ORAL | Status: AC
  Filled 2014-08-26: qty 1

## 2014-08-26 MED ORDER — PANTOPRAZOLE SODIUM 40 MG PO TBEC: 40.0000 mg | DELAYED_RELEASE_TABLET | Freq: Every day | ORAL | Status: DC

## 2014-08-26 MED ORDER — ONDANSETRON HCL 4 MG/2ML IJ SOLN: 4.0000 mg | Freq: Four times a day (QID) | INTRAMUSCULAR | Status: DC | PRN

## 2014-08-26 MED ORDER — NITROGLYCERIN 1 MG/10 ML FOR IR/CATH LAB
INTRA_ARTERIAL | Status: AC
  Filled 2014-08-26: qty 10

## 2014-08-26 MED ORDER — ATORVASTATIN CALCIUM 80 MG PO TABS: 80.0000 mg | ORAL_TABLET | Freq: Every day | ORAL | Status: DC

## 2014-08-26 MED ORDER — FENTANYL CITRATE 0.05 MG/ML IJ SOLN
INTRAMUSCULAR | Status: AC
  Filled 2014-08-26: qty 2

## 2014-08-26 MED ORDER — MIDAZOLAM HCL 2 MG/2ML IJ SOLN
INTRAMUSCULAR | Status: AC
  Filled 2014-08-26: qty 2

## 2014-08-26 MED ORDER — MORPHINE SULFATE 2 MG/ML IJ SOLN: 2.0000 mg | INTRAMUSCULAR | Status: DC | PRN

## 2014-08-26 MED ORDER — LIDOCAINE HCL (PF) 1 % IJ SOLN
INTRAMUSCULAR | Status: AC
  Filled 2014-08-26: qty 30

## 2014-08-26 NOTE — Discharge Instructions (Signed)
Groin Site Care °Refer to this sheet in the next few weeks. These instructions provide you with information on caring for yourself after your procedure. Your caregiver may also give you more specific instructions. Your treatment has been planned according to current medical practices, but problems sometimes occur. Call your caregiver if you have any problems or questions after your procedure. °HOME CARE INSTRUCTIONS °· You may shower 24 hours after the procedure. Remove the bandage (dressing) and gently wash the site with plain soap and water. Gently pat the site dry.  °· Do not apply powder or lotion to the site.  °· Do not sit in a bathtub, swimming pool, or whirlpool for 5 to 7 days.  °· No bending, squatting, or lifting anything over 10 pounds (4.5 kg) as directed by your caregiver.  °· Inspect the site at least twice daily.  °· Do not drive home if you are discharged the same day of the procedure. Have someone else drive you.  °· You may drive 24 hours after the procedure unless otherwise instructed by your caregiver.  °What to expect: °· Any bruising will usually fade within 1 to 2 weeks.  °· Blood that collects in the tissue (hematoma) may be painful to the touch. It should usually decrease in size and tenderness within 1 to 2 weeks.  °SEEK IMMEDIATE MEDICAL CARE IF: °· You have unusual pain at the groin site or down the affected leg.  °· You have redness, warmth, swelling, or pain at the groin site.  °· You have drainage (other than a small amount of blood on the dressing).  °· You have chills.  °· You have a fever or persistent symptoms for more than 72 hours.  °· You have a fever and your symptoms suddenly get worse.  °· Your leg becomes pale, cool, tingly, or numb.  °You have heavy bleeding from the site. Hold pressure on the site. . ° °Cardiac Diet °This diet can help prevent heart disease and stroke. Many factors influence your heart health, including eating and exercise habits. Coronary risk rises a  lot with abnormal blood fat (lipid) levels. Cardiac meal planning includes limiting unhealthy fats, increasing healthy fats, and making other small dietary changes. General guidelines are as follows: °· Adjust calorie intake to reach and maintain desirable body weight. °· Limit total fat intake to less than 30% of total calories. Saturated fat should be less than 7% of calories. °· Saturated fats are found in animal products and in some vegetable products. Saturated vegetable fats are found in coconut oil, cocoa butter, palm oil, and palm kernel oil. Read labels carefully to avoid these products as much as possible. Use butter in moderation. Choose tub margarines and oils that have 2 grams of fat or less. Good cooking oils are canola and olive oils. °· Practice low-fat cooking techniques. Do not fry food. Instead, broil, bake, boil, steam, grill, roast on a rack, stir-fry, or microwave it. Other fat reducing suggestions include: °¨ Remove the skin from poultry. °¨ Remove all visible fat from meats. °¨ Skim the fat off stews, soups, and gravies before serving them. °¨ Steam vegetables in water or broth instead of sautéing them in fat. °· Avoid foods with trans fat (or hydrogenated oils), such as commercially fried foods and commercially baked goods. Commercial shortening and deep-frying fats will contain trans fat. °· Increase intake of fruits, vegetables, whole grains, and legumes to replace foods high in fat. °¨ Increase consumption of nuts, legumes, and seeds   to at least 4 servings weekly. One serving of a legume equals ½ cup, and 1 serving of nuts or seeds equals ¼ cup. °¨ Choose whole grains more often. Have 3 servings per day (a serving is 1 ounce [oz]). °¨ Eat 4 to 5 servings of vegetables per day. A serving of vegetables is 1 cup of raw leafy vegetables; ½ cup of raw or cooked cut-up vegetables; ½ cup of vegetable juice. °¨ Eat 4 to 5 servings of fruit per day. A serving of fruit is 1 medium whole fruit; ¼  cup of dried fruit; ½ cup of fresh, frozen, or canned fruit; ½ cup of 100% fruit juice. °· Increase your intake of dietary fiber to 20 to 30 grams per day. Insoluble fiber may help lower your risk of heart disease and may help curb your appetite.  °Soluble fiber binds cholesterol to be removed from the blood. Foods high in soluble fiber are dried beans, citrus fruits, oats, apples, bananas, broccoli, Brussels sprouts, and eggplant. °· Try to include foods fortified with plant sterols or stanols, such as yogurt, breads, juices, or margarines. Choose several fortified foods to achieve a daily intake of 2 to 3 grams of plant sterols or stanols. °· Foods with omega-3 fats can help reduce your risk of heart disease. Aim to have a 3.5 oz portion of fatty fish twice per week, such as salmon, mackerel, albacore tuna, sardines, lake trout, or herring. If you wish to take a fish oil supplement, choose one that contains 1 gram of both DHA and EPA. °· Limit processed meats to 2 servings (3 oz portion) weekly. °· Limit the sodium in your diet to 1500 milligrams (mg) per day. If you have high blood pressure, talk to a registered dietitian about a DASH (Dietary Approaches to Stop Hypertension) eating plan. °· Limit sweets and beverages with added sugar, such as soda, to no more than 5 servings per week. One serving is:   °¨ 1 tablespoon sugar. °¨ 1 tablespoon jelly or jam. °¨ ½ cup sorbet. °¨ 1 cup lemonade. °¨ ½ cup regular soda. °CHOOSING FOODS °Starches °· Allowed: Breads: All kinds (wheat, rye, raisin, white, oatmeal, Italian, French, and English muffin bread). Low-fat rolls: English muffins, frankfurter and hamburger buns, bagels, pita bread, tortillas (not fried). Pancakes, waffles, biscuits, and muffins made with recommended oil. °· Avoid: Products made with saturated or trans fats, oils, or whole milk products. Butter rolls, cheese breads, croissants. Commercial doughnuts, muffins, sweet rolls, biscuits, waffles, pancakes,  store-bought mixes. °Crackers °· Allowed: Low-fat crackers and snacks: Animal, graham, rye, saltine (with recommended oil, no lard), oyster, and matzo crackers. Bread sticks, melba toast, rusks, flatbread, pretzels, and light popcorn. °· Avoid: High-fat crackers: cheese crackers, butter crackers, and those made with coconut, palm oil, or trans fat (hydrogenated oils). Buttered popcorn. °Cereals °· Allowed: Hot or cold whole-grain cereals. °· Avoid: Cereals containing coconut, hydrogenated vegetable fat, or animal fat. °Potatoes / Pasta / Rice °· Allowed: All kinds of potatoes, rice, and pasta (such as macaroni, spaghetti, and noodles). °· Avoid: Pasta or rice prepared with cream sauce or high-fat cheese. Chow mein noodles, French fries. °Vegetables °· Allowed: All vegetables and vegetable juices. °· Avoid: Fried vegetables. Vegetables in cream, butter, or high-fat cheese sauces. Limit coconut. Fruit in cream or custard. °Protein °· Allowed: Limit your intake of meat, seafood, and poultry to no more than 6 oz (cooked weight) per day. All lean, well-trimmed beef, veal, pork, and lamb. All chicken and turkey without skin.   All fish and shellfish. Wild game: wild duck, rabbit, pheasant, and venison. Egg whites or low-cholesterol egg substitutes may be used as desired. Meatless dishes: recipes with dried beans, peas, lentils, and tofu (soybean curd). Seeds and nuts: all seeds and most nuts. °· Avoid: Prime grade and other heavily marbled and fatty meats, such as short ribs, spare ribs, rib eye roast or steak, frankfurters, sausage, bacon, and high-fat luncheon meats, mutton. Caviar. Commercially fried fish. Domestic duck, goose, venison sausage. Organ meats: liver, gizzard, heart, chitterlings, brains, kidney, sweetbreads. °Dairy °· Allowed: Low-fat cheeses: nonfat or low-fat cottage cheese (1% or 2% fat), cheeses made with part skim milk, such as mozzarella, farmers, string, or ricotta. (Cheeses should be labeled no more  than 2 to 6 grams fat per oz.). Skim (or 1%) milk: liquid, powdered, or evaporated. Buttermilk made with low-fat milk. Drinks made with skim or low-fat milk or cocoa. Chocolate milk or cocoa made with skim or low-fat (1%) milk. Nonfat or low-fat yogurt. °· Avoid: Whole milk cheeses, including colby, cheddar, muenster, Monterey Jack, Havarti, Brie, Camembert, American, Swiss, and blue. Creamed cottage cheese, cream cheese. Whole milk and whole milk products, including buttermilk or yogurt made from whole milk, drinks made from whole milk. Condensed milk, evaporated whole milk, and 2% milk. °Soups and Combination Foods °· Allowed: Low-fat low-sodium soups: broth, dehydrated soups, homemade broth, soups with the fat removed, homemade cream soups made with skim or low-fat milk. Low-fat spaghetti, lasagna, chili, and Spanish rice if low-fat ingredients and low-fat cooking techniques are used. °· Avoid: Cream soups made with whole milk, cream, or high-fat cheese. All other soups. °Desserts and Sweets °· Allowed: Sherbet, fruit ices, gelatins, meringues, and angel food cake. Homemade desserts with recommended fats, oils, and milk products. Jam, jelly, honey, marmalade, sugars, and syrups. Pure sugar candy, such as gum drops, hard candy, jelly beans, marshmallows, mints, and small amounts of dark chocolate. °· Avoid: Commercially prepared cakes, pies, cookies, frosting, pudding, or mixes for these products. Desserts containing whole milk products, chocolate, coconut, lard, palm oil, or palm kernel oil. Ice cream or ice cream drinks. Candy that contains chocolate, coconut, butter, hydrogenated fat, or unknown ingredients. Buttered syrups. °Fats and Oils °· Allowed: Vegetable oils: safflower, sunflower, corn, soybean, cottonseed, sesame, canola, olive, or peanut. Non-hydrogenated margarines. Salad dressing or mayonnaise: homemade or commercial, made with a recommended oil. Low or nonfat salad dressing or mayonnaise. °¨ Limit  added fats and oils to 6 to 8 tsp per day (includes fats used in cooking, baking, salads, and spreads on bread). Remember to count the "hidden fats" in foods. °· Avoid: Solid fats and shortenings: butter, lard, salt pork, bacon drippings. Gravy containing meat fat, shortening, or suet. Cocoa butter, coconut. Coconut oil, palm oil, palm kernel oil, or hydrogenated oils: these ingredients are often used in bakery products, nondairy creamers, whipped toppings, candy, and commercially fried foods. Read labels carefully. Salad dressings made of unknown oils, sour cream, or cheese, such as blue cheese and Roquefort. Cream, all kinds: half-and-half, light, heavy, or whipping. Sour cream or cream cheese (even if "light" or low-fat). Nondairy cream substitutes: coffee creamers and sour cream substitutes made with palm, palm kernel, hydrogenated oils, or coconut oil. °Beverages °· Allowed: Coffee (regular or decaffeinated), tea. Diet carbonated beverages, mineral water. Alcohol: Check with your caregiver. Moderation is recommended. °· Avoid: Whole milk, regular sodas, and juice drinks with added sugar. °Condiments °· Allowed: All seasonings and condiments. Cocoa powder. "Cream" sauces made with recommended ingredients. °·   Avoid: Carob powder made with hydrogenated fats. °SAMPLE MENU °Breakfast °· ½ cup orange juice °· ½ cup oatmeal °· 1 slice toast °· 1 tsp margarine °· 1 cup skim milk °Lunch °· Turkey sandwich with 2 oz turkey, 2 slices bread °· Lettuce and tomato slices °· Fresh fruit °· Carrot sticks °· Coffee or tea °Snack °· Fresh fruit or low-fat crackers °Dinner °· 3 oz lean ground beef °· 1 baked potato °· 1 tsp margarine °· ½ cup asparagus °· Lettuce salad °· 1 tbs non-creamy dressing °· ½ cup peach slices °· 1 cup skim milk °Document Released: 03/02/2008 Document Revised: 11/23/2011 Document Reviewed: 07/24/2013 °[employer]® Patient Information ©2015 [employer]. This information is not intended to replace advice  given to you by your health care provider. Make sure you discuss any questions you have with your health care provider. ° °

## 2014-08-26 NOTE — Progress Notes (Signed)
29fr sheath aspirated and removed from rfa manual pressure applied for 20 minutes. No s+s of hematoma, Tegaderm dressing applied. Bedrest instructions given. Distal pulse palpable rt dp.  Bedrest begins at 12:05pm

## 2014-08-26 NOTE — H&P (View-Only) (Signed)
Patient Name: [name] [name] Date of Encounter: 08/26/2014     Principal Problem:   Chest pain, atypical Active Problems:   Diastolic dysfunction   Heart palpitations   Coronary artery calcification seen on CAT scan   CAD (coronary artery disease), native coronary artery   Numbness and tingling in left arm   Essential hypertension   Morbid obesity    SUBJECTIVE  No further CP. Awaiting cardiac cath.   CURRENT MEDS . aspirin EC  81 mg Oral Daily  . atorvastatin  80 mg Oral q1800  . diltiazem  180 mg Oral QHS  . DULoxetine  60 mg Oral TID  . gabapentin  100 mg Oral TID  . heparin  5,000 Units Subcutaneous 3 times per day  . metoprolol succinate  25 mg Oral Daily  . pantoprazole  40 mg Oral Q1200  . sodium chloride  3 mL Intravenous Q12H  . sodium chloride  3 mL Intravenous Q12H  . Vitamin D (Ergocalciferol)  50,000 Units Oral Q M,W,F    OBJECTIVE  Filed Vitals:   08/26/14 0010 08/26/14 0400 08/26/14 0425 08/26/14 0740  BP: 115/75 124/74  105/74  Pulse: 68 63  68  Temp: 98.3 F (36.8 C) 98 F (36.7 C)  98.6 F (37 C)  TempSrc:    Oral  Resp: 21 18  16   Height:      Weight:   245 lb (111.131 kg)   SpO2: 100% 100%  100%    Intake/Output Summary (Last 24 hours) at 08/26/14 0759 Last data filed at 08/26/14 0010  Gross per 24 hour  Intake      0 ml  Output    100 ml  Net   -100 ml   Filed Weights   08/24/14 0500 08/25/14 0400 08/26/14 0425  Weight: 244 lb 3.2 oz (110.768 kg) 244 lb (110.678 kg) 245 lb (111.131 kg)    PHYSICAL EXAM  General: Pleasant, NAD. obese Neuro: Alert and oriented X 3. Moves all extremities spontaneously. Psych: Normal affect. HEENT:  Normal  Neck: Supple without bruits or JVD. Lungs:  Resp regular and unlabored, CTA. Heart: RRR no s3, s4, or murmurs. Abdomen: Soft, non-tender, non-distended, BS + x 4.  Extremities: No clubbing, cyanosis or edema. DP/PT/Radials 2+ and equal bilaterally.  Accessory Clinical  Findings  CBC No results for input(s): WBC, NEUTROABS, HGB, HCT, MCV, PLT in the last 72 hours. Basic Metabolic Panel No results for input(s): NA, K, CL, CO2, GLUCOSE, BUN, CREATININE, CALCIUM, MG, PHOS in the last 72 hours. Liver Function Tests No results for input(s): AST, ALT, ALKPHOS, BILITOT, PROT, ALBUMIN in the last 72 hours. No results for input(s): LIPASE, AMYLASE in the last 72 hours. Cardiac Enzymes  Recent Labs  08/23/14 1142  TROPONINI <0.03    TELE  NSR with few PVCs   Radiology/Studies  Dg Chest 2 View  08/22/2014   CLINICAL DATA:  Chest pain and shortness of Breath  EXAM: CHEST  2 VIEW  COMPARISON:  08/29/2013  FINDINGS: Cardiac shadow is stable. The lungs are well aerated bilaterally. No focal infiltrate or sizable effusion is seen. No acute bony abnormality is noted.  IMPRESSION: No acute abnormality noted.   Electronically Signed   By: 08/31/2013 M.D.   On: 08/22/2014 19:59   Ct Head Wo Contrast  08/24/2014   CLINICAL DATA:  Numbness and tingling in left arm. Left facial numbness.  EXAM: CT HEAD WITHOUT CONTRAST  TECHNIQUE: Contiguous axial images were obtained  from the base of the skull through the vertex without intravenous contrast.  COMPARISON:  Head CT 05/08/2012  FINDINGS: No acute intracranial hemorrhage. No focal mass lesion. No CT evidence of acute infarction. No midline shift or mass effect. No hydrocephalus. Basilar cisterns are patent. Paranasal sinuses and mastoid air cells are clear.  IMPRESSION: Normal head CT.   Electronically Signed   By: Stewart  Edmunds M.D.   On: 08/24/2014 12:05    ASSESSMENT AND PLAN  [name] [name] is a 49 y.o. female with a history of diastolic dysfunction, RA, depression, who presented back to [facility] on 3/17. She had cardiac workup with nuclear stress test and echo were normal with diastolic dysfunction and elevated filling pressures on echo in 2014. A coronary CTA showed calcium score in the 95th% but no obstructive  ASCAD (30% prox LAD stenosis). Seen in clinic Friday 3/18 after she started having pain in her chest that she describes as someone sitting on her chest.  1. Atypical Chest Pain/Coronary Calcification on CT: Pt cont to have constant chest pain, now 5/10. Looks comfortable. CE neg/no objective evidence of ischemia thus far despite c/p since Friday. Given ongoing Ss and repeat admissions, plan for cath today. -- Cont asa, statin, bb. -- She can likely be discharged home if cath negative.   2. Chronic diast CHF: Euvolemic. HR/BP stable. Cont bb/dilt.  3. HTN: Stable.  4. HL: LDL 121. Cont statin.  5. Palpitations: No events  on tele. Cont bb/dilt.  6. Numbness/tingling in left face/arm: Resolved. Head CT negative.  Signed, THOMPSON, KATHRYN R PA-C  Pager [phone]  Patient seen, examined. Available data reviewed. Agree with findings, assessment, and plan as outlined by Katie Thompson, PA-C. Exam reveals an alert, oriented, obese woman in no distress. Lungs are clear. Heart is regular rate and rhythm. There is no peripheral edema. Chart reviewed. Patient with chest pain, negative cardiac enzymes, and normal EKG. She does have a high coronary calcification score. With ongoing chest discomfort, cardiac catheterization has been recommended.  I have reviewed the risks, indications, and alternatives to cardiac catheterization and possible PCI with the patient and her mother who was at the bedside. Risks include but are not limited to bleeding, infection, vascular injury, stroke, myocardial infection, arrhythmia, kidney injury, radiation-related injury in the case of prolonged fluoroscopy use, emergency cardiac surgery, and death. The patient understands the risks of serious complication is low (<1%).   If her cardiac catheterization demonstrates nonobstructive disease, I agree that she would be eligible for discharge home meter today.  Li Bobo, M.D. 08/26/2014 9:38  AM   

## 2014-08-26 NOTE — Discharge Summary (Signed)
Discharge Summary   Patient ID: [name] [name] MRN: [id number], DOB/AGE: March 22, 1966 49 y.o. Admit date: 08/22/2014 D/C date:     08/26/2014  Primary Cardiologist: Dr. Mayford Knife  Principal Problem:   Chest pain, atypical Active Problems:   Diastolic dysfunction   Heart palpitations   Coronary artery calcification seen on CAT scan   CAD (coronary artery disease), native coronary artery   Numbness and tingling in left arm   Essential hypertension   Morbid obesity    Admission Dates: 08/22/14-08/26/14 Discharge Diagnosis: chest pain - s/p LHC with normal coronaries  HPI: [name] [name] is a 49 y.o. female with a history of diastolic dysfunction, RA, depression and HTN who presented to [facility] on 08/22/14 with chest pain.   She had cardiac workup with nuclear stress test and echo were normal with diastolic dysfunction and elevated filling pressures on echo in 2014. A coronary CTA showed calcium score in the 95th% but no obstructive ASCAD (30% prox LAD stenosis). Seen in clinic Friday 3/16 after she started having pain in her chest that she describes as someone sitting on her chest. She presented to the ED on 08/22/14 with ongoing chest pain.   Hospital Course  1. Atypical Chest Pain/Coronary Calcification on CT: Pt cont to have constant chest pain, now 5/10. Looks comfortable. CE neg/no objective evidence of ischemia thus far despite c/p since Friday. Given ongoing Ss and repeat admissions, so she was referred for cath today.  -- S/P LHC today which revealed normal coronary arteries and normal LV function. Her chest pain is felt to be noncardiac. Will start PPI  -- Cont asa, statin, bb.  2. Chronic diast CHF: Euvolemic. HR/BP stable. Cont bb/dilt.  3. HTN: Stable.  4. HL: LDL 121. Cont statin.  5. Palpitations: No events on tele. Cont bb/dilt.  6. Numbness/tingling in left face/arm: Resolved. Head CT negative.   The patient has had an uncomplicated  hospital course and is recovering well. The femoral catheter site is stable. She has been seen by Dr. Excell Seltzer today and deemed ready for discharge home. All follow-up appointments have been scheduled.  Discharge medications are listed below.   Discharge Vitals: Blood pressure 136/98, pulse 59, temperature 98.6 F (37 C), temperature source Oral, resp. rate 13, height 5\' 2"  (1.575 m), weight 245 lb (111.131 kg), SpO2 100 %.  Labs: Lab Results  Component Value Date   WBC 10.6* 08/23/2014   HGB 13.8 08/23/2014   HCT 42.4 08/23/2014   MCV 84.5 08/23/2014   PLT 286 08/23/2014     Recent Labs Lab 08/23/14 0610  NA 140  K 3.9  CL 109  CO2 25  BUN 12  CREATININE 0.76  CALCIUM 9.0  GLUCOSE 82   No results for input(s): CKTOTAL, CKMB, TROPONINI in the last 72 hours. Lab Results  Component Value Date   CHOL 182 08/23/2014   HDL 49 08/23/2014   LDLCALC 121* 08/23/2014   TRIG 62 08/23/2014   Lab Results  Component Value Date   DDIMER 0.31 08/22/2014    Diagnostic Studies/Procedures   Dg Chest 2 View  08/22/2014   CLINICAL DATA:  Chest pain and shortness of Breath  EXAM: CHEST  2 VIEW  COMPARISON:  08/29/2013  FINDINGS: Cardiac shadow is stable. The lungs are well aerated bilaterally. No focal infiltrate or sizable effusion is seen. No acute bony abnormality is noted.  IMPRESSION: No acute abnormality noted.   Electronically Signed   By: Eulah Pont.D.  On: 08/22/2014 19:59   Ct Head Wo Contrast  08/24/2014   CLINICAL DATA:  Numbness and tingling in left arm. Left facial numbness.  EXAM: CT HEAD WITHOUT CONTRAST  TECHNIQUE: Contiguous axial images were obtained from the base of the skull through the vertex without intravenous contrast.  COMPARISON:  Head CT 05/08/2012  FINDINGS: No acute intracranial hemorrhage. No focal mass lesion. No CT evidence of acute infarction. No midline shift or mass effect. No hydrocephalus. Basilar cisterns are patent. Paranasal sinuses and mastoid air  cells are clear.  IMPRESSION: Normal head CT.   Electronically Signed   By: Genevive Bi M.D.   On: 08/24/2014 12:05    08/26/14  CARDIAC CATHETERIZATION   History obtained from chart review. [name] [name] is a 49 year old moderately overweight African-American female with history of hypertension and atypical chest pain. She had a normal Myoview stress test but a CTA that showed high coronary calcium score without obstructive disease. She continues to have chest pain. She was admitted with chest pain and ruled out for myocardial infarction. She presents today for diagnostic coronary arteriography to define her anatomy rule out an ischemic etiology. HEMODYNAMICS:   AO SYSTOLIC/AO DIASTOLIC: 141/89  LV SYSTOLIC/LV DIASTOLIC: 145/23  ANGIOGRAPHIC RESULTS:  1. Left main; normal  2. LAD; normal 3. Left circumflex; normal.  4. Right coronary artery; dominant and normal 5. Left ventriculography; RAO left ventriculogram was performed using  25 mL of Visipaque dye at 12 mL/second. The overall LVEF estimated  60 % Without wall motion abnormalities  IMPRESSION:[name] [name] has normal coronary arteries and normal LV function. Her chest pain was noncardiac. A femoral angiogram was performed with the intention to close her femoral puncture site with a "minx closure device" however I puncture the origin of the profunda therefore making that not possible. Patient will be gently hydrated, remain recumbent for 4 hours and then will be discharged home. She will follow-up with the mid-level provider in one to 2 weeks after that when necessary. She'll be treated with antireflux measures.   Discharge Medications     Medication List    TAKE these medications        Acyclovir-Hydrocortisone 5-1 % Crea  Commonly known as:  XERESE  Apply 1 application topically every 24 hours x 5 doses.     atorvastatin 80 MG tablet  Commonly known as:  LIPITOR  Take 1 tablet (80 mg total) by mouth daily at  6 PM.     benzonatate 200 MG capsule  Commonly known as:  TESSALON  Take 1 capsule (200 mg total) by mouth 3 (three) times daily as needed for cough.     cetirizine 10 MG tablet  Commonly known as:  ZYRTEC  Take 1 tablet (10 mg total) by mouth daily.     cyclobenzaprine 10 MG tablet  Commonly known as:  FLEXERIL  Take 10 mg by mouth 3 (three) times daily as needed for muscle spasms.     diltiazem 180 MG 24 hr capsule  Commonly known as:  CARTIA XT  Take 1 capsule (180 mg total) by mouth daily.     DULoxetine 60 MG capsule  Commonly known as:  CYMBALTA  Take 60 mg by mouth 3 (three) times daily.     EPINEPHrine 0.3 mg/0.3 mL Soaj injection  Commonly known as:  EPI-PEN  Inject 0.3 mLs (0.3 mg total) into the muscle once.     famciclovir 500 MG tablet  Commonly known as:  FAMVIR  Take three  tablets at onset of fever blisters x 1 day     fluticasone 50 MCG/ACT nasal spray  Commonly known as:  FLONASE  Place 2 sprays into both nostrils as needed for allergies.     gabapentin 100 MG capsule  Commonly known as:  NEURONTIN  Take 100 mg by mouth 3 (three) times daily.     HYDROcodone-acetaminophen 5-325 MG per tablet  Commonly known as:  NORCO/VICODIN  Take 1 tablet by mouth every 6 (six) hours as needed for pain.     hydrOXYzine 25 MG capsule  Commonly known as:  VISTARIL  Take 1 capsule BID prn for increased anxiety     lidocaine 5 %  Commonly known as:  LIDODERM  Place 1 patch onto the skin as needed (for pain).     methotrexate 25 MG/ML injection  Inject 1 mL into the muscle every Tuesday.     ORENCIA IV  Inject 1,000 mg into the vein every 28 (twenty-eight) days.     pantoprazole 40 MG tablet  Commonly known as:  PROTONIX  Take 1 tablet (40 mg total) by mouth daily at 12 noon.     Vitamin D (Ergocalciferol) 50000 UNITS Caps capsule  Commonly known as:  DRISDOL  Take 50,000 Units by mouth every Monday, Wednesday, and Friday.     VOLTAREN 1 % Gel  Generic  drug:  diclofenac sodium  Apply 2 g topically daily as needed (for pain).        Disposition   The patient will be discharged in stable condition to home.  Follow-up Information    Follow up with Tereso Newcomer, PA-C On 09/18/2014.   Specialty:  Physician Assistant   Why:  @ 10:10am    Contact information:   [address] [place] [address] [phone]         Duration of Discharge Encounter: Greater than 30 minutes including physician and PA time.  Byrd Hesselbach R PA-C 08/26/2014, 3:15 PM

## 2014-08-26 NOTE — Interval H&P Note (Signed)
Cath Lab Visit (complete for each Cath Lab visit)  Clinical Evaluation Leading to the Procedure:   ACS: Yes.    Non-ACS:    Anginal Classification: CCS IV  Anti-ischemic medical therapy: Maximal Therapy (2 or more classes of medications)  Non-Invasive Test Results: Low-risk stress test findings: cardiac mortality <1%/year  Prior CABG: No previous CABG      History and Physical Interval Note:  08/26/2014 10:37 AM  [name] [name]  has presented today for surgery, with the diagnosis of unstable angina  The various methods of treatment have been discussed with the patient and family. After consideration of risks, benefits and other options for treatment, the patient has consented to  Procedure(s): LEFT HEART CATHETERIZATION WITH CORONARY ANGIOGRAM (N/A) as a surgical intervention .  The patient's history has been reviewed, patient examined, no change in status, stable for surgery.  I have reviewed the patient's chart and labs.  Questions were answered to the patient's satisfaction.     Runell Gess

## 2014-08-26 NOTE — Progress Notes (Signed)
Patient Name: [name] [name] Date of Encounter: 08/26/2014     Principal Problem:   Chest pain, atypical Active Problems:   Diastolic dysfunction   Heart palpitations   Coronary artery calcification seen on CAT scan   CAD (coronary artery disease), native coronary artery   Numbness and tingling in left arm   Essential hypertension   Morbid obesity    SUBJECTIVE  No further CP. Awaiting cardiac cath.   CURRENT MEDS . aspirin EC  81 mg Oral Daily  . atorvastatin  80 mg Oral q1800  . diltiazem  180 mg Oral QHS  . DULoxetine  60 mg Oral TID  . gabapentin  100 mg Oral TID  . heparin  5,000 Units Subcutaneous 3 times per day  . metoprolol succinate  25 mg Oral Daily  . pantoprazole  40 mg Oral Q1200  . sodium chloride  3 mL Intravenous Q12H  . sodium chloride  3 mL Intravenous Q12H  . Vitamin D (Ergocalciferol)  50,000 Units Oral Q M,W,F    OBJECTIVE  Filed Vitals:   08/26/14 0010 08/26/14 0400 08/26/14 0425 08/26/14 0740  BP: 115/75 124/74  105/74  Pulse: 68 63  68  Temp: 98.3 F (36.8 C) 98 F (36.7 C)  98.6 F (37 C)  TempSrc:    Oral  Resp: 21 18  16   Height:      Weight:   245 lb (111.131 kg)   SpO2: 100% 100%  100%    Intake/Output Summary (Last 24 hours) at 08/26/14 0759 Last data filed at 08/26/14 0010  Gross per 24 hour  Intake      0 ml  Output    100 ml  Net   -100 ml   Filed Weights   08/24/14 0500 08/25/14 0400 08/26/14 0425  Weight: 244 lb 3.2 oz (110.768 kg) 244 lb (110.678 kg) 245 lb (111.131 kg)    PHYSICAL EXAM  General: Pleasant, NAD. obese Neuro: Alert and oriented X 3. Moves all extremities spontaneously. Psych: Normal affect. HEENT:  Normal  Neck: Supple without bruits or JVD. Lungs:  Resp regular and unlabored, CTA. Heart: RRR no s3, s4, or murmurs. Abdomen: Soft, non-tender, non-distended, BS + x 4.  Extremities: No clubbing, cyanosis or edema. DP/PT/Radials 2+ and equal bilaterally.  Accessory Clinical  Findings  CBC No results for input(s): WBC, NEUTROABS, HGB, HCT, MCV, PLT in the last 72 hours. Basic Metabolic Panel No results for input(s): NA, K, CL, CO2, GLUCOSE, BUN, CREATININE, CALCIUM, MG, PHOS in the last 72 hours. Liver Function Tests No results for input(s): AST, ALT, ALKPHOS, BILITOT, PROT, ALBUMIN in the last 72 hours. No results for input(s): LIPASE, AMYLASE in the last 72 hours. Cardiac Enzymes  Recent Labs  08/23/14 1142  TROPONINI <0.03    TELE  NSR with few PVCs   Radiology/Studies  Dg Chest 2 View  08/22/2014   CLINICAL DATA:  Chest pain and shortness of Breath  EXAM: CHEST  2 VIEW  COMPARISON:  08/29/2013  FINDINGS: Cardiac shadow is stable. The lungs are well aerated bilaterally. No focal infiltrate or sizable effusion is seen. No acute bony abnormality is noted.  IMPRESSION: No acute abnormality noted.   Electronically Signed   By: 08/31/2013 M.D.   On: 08/22/2014 19:59   Ct Head Wo Contrast  08/24/2014   CLINICAL DATA:  Numbness and tingling in left arm. Left facial numbness.  EXAM: CT HEAD WITHOUT CONTRAST  TECHNIQUE: Contiguous axial images were obtained  from the base of the skull through the vertex without intravenous contrast.  COMPARISON:  Head CT 05/08/2012  FINDINGS: No acute intracranial hemorrhage. No focal mass lesion. No CT evidence of acute infarction. No midline shift or mass effect. No hydrocephalus. Basilar cisterns are patent. Paranasal sinuses and mastoid air cells are clear.  IMPRESSION: Normal head CT.   Electronically Signed   By: Genevive Bi M.D.   On: 08/24/2014 12:05    ASSESSMENT AND PLAN  [name] [name] is a 49 y.o. female with a history of diastolic dysfunction, RA, depression, who presented back to [facility] on 3/17. She had cardiac workup with nuclear stress test and echo were normal with diastolic dysfunction and elevated filling pressures on echo in 2014. A coronary CTA showed calcium score in the 95th% but no obstructive  ASCAD (30% prox LAD stenosis). Seen in clinic Friday 3/18 after she started having pain in her chest that she describes as someone sitting on her chest.  1. Atypical Chest Pain/Coronary Calcification on CT: Pt cont to have constant chest pain, now 5/10. Looks comfortable. CE neg/no objective evidence of ischemia thus far despite c/p since Friday. Given ongoing Ss and repeat admissions, plan for cath today. -- Cont asa, statin, bb. -- She can likely be discharged home if cath negative.   2. Chronic diast CHF: Euvolemic. HR/BP stable. Cont bb/dilt.  3. HTN: Stable.  4. HL: LDL 121. Cont statin.  5. Palpitations: No events  on tele. Cont bb/dilt.  6. Numbness/tingling in left face/arm: Resolved. Head CT negative.  Billy Fischer PA-C  Pager [phone]  Patient seen, examined. Available data reviewed. Agree with findings, assessment, and plan as outlined by Carlean Jews, PA-C. Exam reveals an alert, oriented, obese woman in no distress. Lungs are clear. Heart is regular rate and rhythm. There is no peripheral edema. Chart reviewed. Patient with chest pain, negative cardiac enzymes, and normal EKG. She does have a high coronary calcification score. With ongoing chest discomfort, cardiac catheterization has been recommended.  I have reviewed the risks, indications, and alternatives to cardiac catheterization and possible PCI with the patient and her mother who was at the bedside. Risks include but are not limited to bleeding, infection, vascular injury, stroke, myocardial infection, arrhythmia, kidney injury, radiation-related injury in the case of prolonged fluoroscopy use, emergency cardiac surgery, and death. The patient understands the risks of serious complication is low (<1%).   If her cardiac catheterization demonstrates nonobstructive disease, I agree that she would be eligible for discharge home meter today.  Tonny Bollman, M.D. 08/26/2014 9:38  AM

## 2014-08-26 NOTE — CV Procedure (Signed)
CYAN CLIPPINGER is a 49 y.o. female    [id number] LOCATION:  FACILITY: [facility]  PHYSICIAN: Nanetta Batty, M.D. 15-Jan-1966   DATE OF PROCEDURE:  08/26/2014  DATE OF DISCHARGE:     CARDIAC CATHETERIZATION     History obtained from chart review. Mrs. Klebba is a 49 year old moderately overweight African-American female with history of hypertension and atypical chest pain. She had a normal Myoview stress test but a CTA that showed high coronary calcium score without obstructive disease. She continues to have chest pain. She was admitted with chest pain and ruled out for myocardial infarction. She presents today for diagnostic coronary arteriography to define her anatomy rule out an ischemic etiology.   PROCEDURE DESCRIPTION:   The patient was brought to the second floor  Cardiac cath lab in the postabsorptive state. She was premedicated with IV Versed and fentanyl. Her right groinwas prepped and shaved in usual sterile fashion. Xylocaine 1% was used for local anesthesia. A 5 French sheath was inserted into the right common femoral artery using standard Seldinger technique. Her Allen's test was negative making radial not advisable. 5 French right and left Judkins diagnostic catheters long with a 5 French pigtail catheter were used selective coronary angiography and left ventriculography respectively. Visipaque dye was used for the entirety of the case. Retrograde aortic, left ventricular and pullback pressures were recorded.   HEMODYNAMICS:    AO SYSTOLIC/AO DIASTOLIC: 141/89   LV SYSTOLIC/LV DIASTOLIC: 145/23  ANGIOGRAPHIC RESULTS:   1. Left main; normal  2. LAD; normal 3. Left circumflex; normal.  4. Right coronary artery; dominant and normal 5. Left ventriculography; RAO left ventriculogram was performed using  25 mL of Visipaque dye at 12 mL/second. The overall LVEF estimated  60 %  Without wall motion abnormalities  IMPRESSION:Mrs. Horn has normal coronary  arteries and normal LV function. Her chest pain was noncardiac. A femoral angiogram was performed with the intention to close her femoral puncture site with a "minx closure device" however I puncture the origin of the profunda therefore making that not possible. Patient will be gently hydrated, remain recumbent for 4 hours and then will be discharged home. She will follow-up with the mid-level provider in one to 2 weeks after that when necessary. She'll be treated with antireflux measures.  Runell Gess MD, [facility] 08/26/2014 11:06 AM

## 2014-08-27 ENCOUNTER — Encounter (HOSPITAL_COMMUNITY): Payer: Self-pay | Admitting: Cardiovascular Disease

## 2014-09-03 ENCOUNTER — Other Ambulatory Visit (HOSPITAL_COMMUNITY): Payer: Self-pay | Admitting: *Deleted

## 2014-09-04 ENCOUNTER — Encounter (HOSPITAL_COMMUNITY)
Admission: RE | Admit: 2014-09-04 | Discharge: 2014-09-04 | Disposition: A | Discharge status: Home / Self Care | Payer: Managed Care, Other (non HMO) | Source: Ambulatory Visit | Attending: Rheumatology | Admitting: Rheumatology

## 2014-09-04 DIAGNOSIS — M069 Rheumatoid arthritis, unspecified: Secondary | ICD-10-CM | POA: Diagnosis not present

## 2014-09-04 MED ORDER — ACETAMINOPHEN 325 MG PO TABS: 650.0000 mg | ORAL_TABLET | ORAL | Status: DC

## 2014-09-04 MED ORDER — DIPHENHYDRAMINE HCL 25 MG PO CAPS: 50.0000 mg | ORAL_CAPSULE | ORAL | Status: DC

## 2014-09-04 MED ORDER — SODIUM CHLORIDE 0.9 % IV SOLN
INTRAVENOUS | Status: DC
  Administered 2014-09-04: 10:00:00 via INTRAVENOUS

## 2014-09-04 MED ORDER — SODIUM CHLORIDE 0.9 % IV SOLN
1000.0000 mg | INTRAVENOUS | Status: DC
  Administered 2014-09-04: 1000 mg via INTRAVENOUS
  Filled 2014-09-04: qty 40

## 2014-09-06 LAB — QUANTIFERON IN TUBE
QFT TB AG MINUS NIL VALUE: <0 IU/mL
QUANTIFERON MITOGEN VALUE: 3.82 [IU]/mL
QUANTIFERON NIL VALUE: 0.12 [IU]/mL
QUANTIFERON TB AG VALUE: 0.03 IU/mL
QUANTIFERON TB GOLD: NEGATIVE

## 2014-09-06 LAB — QUANTIFERON TB GOLD ASSAY (BLOOD)

## 2014-09-18 ENCOUNTER — Ambulatory Visit (INDEPENDENT_AMBULATORY_CARE_PROVIDER_SITE_OTHER): Payer: Managed Care, Other (non HMO) | Admitting: Physician Assistant

## 2014-09-18 ENCOUNTER — Encounter: Payer: Self-pay | Admitting: Physician Assistant

## 2014-09-18 VITALS — BP 120/86 | HR 74 | Ht 62.0 in | Wt 246.0 lb

## 2014-09-18 DIAGNOSIS — R0789 Other chest pain: Secondary | ICD-10-CM

## 2014-09-18 DIAGNOSIS — R1310 Dysphagia, unspecified: Secondary | ICD-10-CM

## 2014-09-18 DIAGNOSIS — I519 Heart disease, unspecified: Secondary | ICD-10-CM | POA: Diagnosis not present

## 2014-09-18 DIAGNOSIS — I10 Essential (primary) hypertension: Secondary | ICD-10-CM | POA: Diagnosis not present

## 2014-09-18 DIAGNOSIS — I5189 Other ill-defined heart diseases: Secondary | ICD-10-CM

## 2014-09-18 DIAGNOSIS — E785 Hyperlipidemia, unspecified: Secondary | ICD-10-CM | POA: Diagnosis not present

## 2014-09-18 NOTE — Patient Instructions (Addendum)
Medication Instructions:  Your physician recommends that you continue on your current medications as directed. Please refer to the Current Medication list given to you today.  Labwork: NONE  Testing/Procedures: NONE  Follow-Up: FOLLOW UP WITH DR. Mayford Knife IN 6 MONTHS  Any Other Special Instructions Will Be Listed Below (If Applicable). You have been referred to [facility]; DX DYSPHAGIA, CHEST PAIN, GERD

## 2014-09-18 NOTE — Progress Notes (Signed)
Cardiology Office Note   Date:  09/18/2014   ID:  [name] [name], DOB 09-17-1965, MRN [id number]  PCP:  Birdena Jubilee, MD  Cardiologist:  Dr. Armanda Magic     Chief Complaint  Patient presents with  . Hospitalization Follow-up    admx for chest pain; LHC with normal cors     History of Present Illness: [name] [name] is a 49 y.o. female with a hx of diastolic dysfunction, rheumatoid arthritis, depression, HTN, HL.  She had been evaluated in the past for chest pain with a normal stress test and echocardiogram. Echo did demonstrate mild diastolic dysfunction. Prior coronary CTA demonstrated a calcium score in the 95th percentile but nonobstructive CAD. She was evaluated again by Dr. Mayford Knife 08/22/14 with recurrent chest discomfort.  Her symptoms are quite severe in the office and she was transported to the hospital for further evaluation.  Cardiac enzymes remained normal. Cardiac catheterization demonstrated normal coronary arteries. Proton pump inhibitor was started.  She returns for FU.  She denies any further chest pain since DC.  No significant DOE.  Denies orthopnea, PND, edema.  No syncope.  She continues to have symptoms of dyspepsia.  Also admits to dysphagia and odynophagia.  No hematemesis.  She has loose stools.  No melena or hematochezia.     Studies/Reports Reviewed Today:  LHC 08/26/14 LM:  normal   LAD: normal LCx:  normal.   RCA:  normal EF:  60 %  Without wall motion abnormalities  Echo 05/10/13 - EF 50% to 55%. Grade 1 diastolic dysfunction - Left atrium: The atrium was mildly dilated.  Nuclear 05/21/13 Low risk stress nuclear study fixed defect in the apex consistent with breast attenuation artificat.  No ischemia.  LV Ejection Fraction: 73%.  LV Wall Motion:  NL LV Function; NL Wall Motion  Event monitor 3/15 NSR, PACs, sinus tachycardia  Past Medical History  Diagnosis Date  . Rheumatoid arthritis(714.0)   . Depression   . Morbid obesity   .  HSV-1 infection   . Diastolic dysfunction   . Fibromyalgia   . Coronary artery calcification seen on CAT scan     Past Surgical History  Procedure Laterality Date  . Cesarean section    . Tubal ligation    . Laparoscopic gastric sleeve resection  11/21/12    [facility]  . Left heart catheterization with coronary angiogram N/A 08/26/2014    Procedure: LEFT HEART CATHETERIZATION WITH CORONARY ANGIOGRAM;  Surgeon: Runell Gess, MD;  Location: [facility] CATH LAB;  Service: Cardiovascular;  Laterality: N/A;     Current Outpatient Prescriptions  Medication Sig Dispense Refill  . Abatacept (ORENCIA IV) Inject 1,000 mg into the vein every 28 (twenty-eight) days.     . Acyclovir-Hydrocortisone (XERESE) 5-1 % CREA Apply 1 application topically every 24 hours x 5 doses. 5 g 11  . atorvastatin (LIPITOR) 80 MG tablet Take 1 tablet (80 mg total) by mouth daily at 6 PM. 30 tablet 11  . benzonatate (TESSALON) 200 MG capsule Take 1 capsule (200 mg total) by mouth 3 (three) times daily as needed for cough. 60 capsule 0  . cetirizine (ZYRTEC) 10 MG tablet Take 1 tablet (10 mg total) by mouth daily. 100 tablet 3  . cyclobenzaprine (FLEXERIL) 10 MG tablet Take 10 mg by mouth 3 (three) times daily as needed for muscle spasms.    Marland Kitchen diltiazem (CARTIA XT) 180 MG 24 hr capsule Take 1 capsule (180 mg total) by mouth daily. (Patient  taking differently: Take 180 mg by mouth at bedtime. ) 90 capsule 1  . DULoxetine (CYMBALTA) 60 MG capsule Take 60 mg by mouth 3 (three) times daily.    Marland Kitchen EPINEPHrine 0.3 mg/0.3 mL IJ SOAJ injection Inject 0.3 mLs (0.3 mg total) into the muscle once. 2 Device 11  . famciclovir (FAMVIR) 500 MG tablet Take three tablets at onset of fever blisters x 1 day 30 tablet 11  . fluticasone (FLONASE) 50 MCG/ACT nasal spray Place 2 sprays into both nostrils as needed for allergies. 16 g 11  . gabapentin (NEURONTIN) 100 MG capsule Take 100 mg by mouth 3 (three) times daily.     Marland Kitchen  HYDROcodone-acetaminophen (NORCO/VICODIN) 5-325 MG per tablet Take 1 tablet by mouth every 6 (six) hours as needed for pain. 15 tablet 0  . hydrOXYzine (VISTARIL) 25 MG capsule Take 1 capsule BID prn for increased anxiety 60 capsule 3  . lidocaine (LIDODERM) 5 % Place 1 patch onto the skin as needed (for pain).     . methotrexate 25 MG/ML injection Inject 1 mL into the muscle every Tuesday.     . pantoprazole (PROTONIX) 40 MG tablet Take 1 tablet (40 mg total) by mouth daily at 12 noon. 30 tablet 11  . Vitamin D, Ergocalciferol, (DRISDOL) 50000 UNITS CAPS capsule Take 50,000 Units by mouth every Monday, Wednesday, and Friday.    . VOLTAREN 1 % GEL Apply 2 g topically daily as needed (for pain).      No current facility-administered medications for this visit.    Allergies:   Flu virus vaccine and Influenza vaccines    Social History:  The patient  reports that she has never smoked. She has never used smokeless tobacco. She reports that she does not drink alcohol or use illicit drugs.   Family History:  The patient's family history includes Cancer in her father; Diabetes in her maternal grandmother; Heart attack in her paternal grandmother; Hyperlipidemia in her mother; Hypertension in her maternal grandmother and paternal grandmother; Lung disease in her father; Sarcoidosis in her father. There is no history of Stroke.    ROS:   Please see the history of present illness.   Review of Systems  Psychiatric/Behavioral: Positive for depression. The patient is nervous/anxious.   All other systems reviewed and are negative.    PHYSICAL EXAM: VS:  BP 120/86 mmHg  Pulse 74  Ht 5\' 2"  (1.575 m)  Wt 246 lb (111.585 kg)  BMI 44.98 kg/m2    Wt Readings from Last 3 Encounters:  09/18/14 246 lb (111.585 kg)  09/04/14 237 lb (107.502 kg)  08/26/14 245 lb (111.131 kg)     GEN: Well nourished, well developed, in no acute distress HEENT: normal Neck: no JVD, no masses Cardiac:  Normal S1/S2,  RRR; no murmur ,  no rubs or gallops, no edema ; R groin without hematoma or bruit  Respiratory:  clear to auscultation bilaterally, no wheezing, rhonchi or rales. GI: soft, nontender, nondistended, + BS MS: no deformity or atrophy Skin: warm and dry  Neuro:  CNs II-XII intact, Strength and sensation are intact Psych: Normal affect   EKG:  EKG is ordered today.  It demonstrates:   NSR,HR 74, low voltage, no change from prior tracing   Recent Labs: 02/27/2014: ALT 16 08/22/2014: B Natriuretic Peptide 11.2 08/23/2014: BUN 12; Creatinine 0.76; Hemoglobin 13.8; Platelets 286; Potassium 3.9; Sodium 140    Lipid Panel    Component Value Date/Time   CHOL 182 08/23/2014  0250   TRIG 62 08/23/2014 0250   HDL 49 08/23/2014 0250   CHOLHDL 3.7 08/23/2014 0250   VLDL 12 08/23/2014 0250   LDLCALC 121* 08/23/2014 0250      ASSESSMENT AND PLAN:  Chest pressure As noted, her cath demonstrated normal coronary arteries.  She has a lot of symptoms of dyspepsia, dysphagia, odynophagia.  Continue PPI.  Refer to GI.  Diastolic dysfunction No signs or symptoms of volume excess  Essential hypertension Controlled.   Hyperlipidemia  Continue statin.   Current medicines are reviewed at length with the patient today.  The patient does not have concerns regarding medicines.  The following changes have been made:  no change  Labs/ tests ordered today include:  Orders Placed This Encounter  Procedures  . Ambulatory referral to Gastroenterology  . EKG 12-Lead    Disposition:   FU with Dr. Armanda Magic 6 mos.    Signed, Brynda Rim, MHS 09/18/2014 10:59 AM    [facility] [address] [place], [place], [place]  [address] Phone: [phone]; Fax: [phone]

## 2014-09-23 ENCOUNTER — Encounter: Payer: Self-pay | Admitting: Physician Assistant

## 2014-09-23 ENCOUNTER — Other Ambulatory Visit: Payer: Self-pay | Admitting: Family Medicine

## 2014-09-30 ENCOUNTER — Other Ambulatory Visit: Payer: Self-pay | Admitting: Family Medicine

## 2014-10-01 ENCOUNTER — Other Ambulatory Visit: Payer: Self-pay | Admitting: Orthopaedic Surgery

## 2014-10-01 DIAGNOSIS — M545 Low back pain: Secondary | ICD-10-CM

## 2014-10-02 ENCOUNTER — Encounter (HOSPITAL_COMMUNITY)
Admission: RE | Admit: 2014-10-02 | Discharge: 2014-10-02 | Disposition: A | Discharge status: Home / Self Care | Payer: Managed Care, Other (non HMO) | Source: Ambulatory Visit | Attending: Rheumatology | Admitting: Rheumatology

## 2014-10-02 ENCOUNTER — Encounter (HOSPITAL_COMMUNITY): Payer: Managed Care, Other (non HMO)

## 2014-10-02 DIAGNOSIS — Z79899 Other long term (current) drug therapy: Secondary | ICD-10-CM | POA: Diagnosis not present

## 2014-10-02 DIAGNOSIS — M069 Rheumatoid arthritis, unspecified: Secondary | ICD-10-CM | POA: Diagnosis not present

## 2014-10-02 MED ORDER — SODIUM CHLORIDE 0.9 % IV SOLN
1000.0000 mg | INTRAVENOUS | Status: DC
  Administered 2014-10-02: 1000 mg via INTRAVENOUS
  Filled 2014-10-02: qty 40

## 2014-10-02 MED ORDER — SODIUM CHLORIDE 0.9 % IV SOLN
INTRAVENOUS | Status: DC
  Administered 2014-10-02: 10:00:00 via INTRAVENOUS

## 2014-10-02 MED ORDER — DIPHENHYDRAMINE HCL 25 MG PO CAPS: 50.0000 mg | ORAL_CAPSULE | ORAL | Status: DC

## 2014-10-02 MED ORDER — ACETAMINOPHEN 325 MG PO TABS: 650.0000 mg | ORAL_TABLET | ORAL | Status: DC

## 2014-10-07 ENCOUNTER — Ambulatory Visit (INDEPENDENT_AMBULATORY_CARE_PROVIDER_SITE_OTHER): Payer: Managed Care, Other (non HMO) | Admitting: Physician Assistant

## 2014-10-07 ENCOUNTER — Encounter: Payer: Self-pay | Admitting: Physician Assistant

## 2014-10-07 ENCOUNTER — Other Ambulatory Visit (INDEPENDENT_AMBULATORY_CARE_PROVIDER_SITE_OTHER): Payer: Managed Care, Other (non HMO)

## 2014-10-07 VITALS — BP 110/72 | HR 72 | Ht 62.0 in | Wt 248.0 lb

## 2014-10-07 DIAGNOSIS — G8929 Other chronic pain: Secondary | ICD-10-CM | POA: Diagnosis not present

## 2014-10-07 DIAGNOSIS — R1010 Upper abdominal pain, unspecified: Secondary | ICD-10-CM

## 2014-10-07 DIAGNOSIS — R1011 Right upper quadrant pain: Principal | ICD-10-CM

## 2014-10-07 DIAGNOSIS — R1314 Dysphagia, pharyngoesophageal phase: Secondary | ICD-10-CM

## 2014-10-07 DIAGNOSIS — R1031 Right lower quadrant pain: Secondary | ICD-10-CM

## 2014-10-07 DIAGNOSIS — R194 Change in bowel habit: Secondary | ICD-10-CM | POA: Diagnosis not present

## 2014-10-07 DIAGNOSIS — R0789 Other chest pain: Secondary | ICD-10-CM

## 2014-10-07 LAB — CBC WITH DIFFERENTIAL/PLATELET
Basophils Absolute: 0.1 10*3/uL (ref 0.0–0.1)
Basophils Relative: 0.7 % (ref 0.0–3.0)
EOS ABS: 0.1 10*3/uL (ref 0.0–0.7)
EOS PCT: 1.3 % (ref 0.0–5.0)
HCT: 41.4 % (ref 36.0–46.0)
HEMOGLOBIN: 13.9 g/dL (ref 12.0–15.0)
LYMPHS ABS: 3.3 10*3/uL (ref 0.7–4.0)
Lymphocytes Relative: 31.1 % (ref 12.0–46.0)
MCHC: 33.5 g/dL (ref 30.0–36.0)
MCV: 81.7 fl (ref 78.0–100.0)
Monocytes Absolute: 0.6 10*3/uL (ref 0.1–1.0)
Monocytes Relative: 5.4 % (ref 3.0–12.0)
Neutro Abs: 6.5 10*3/uL (ref 1.4–7.7)
Neutrophils Relative %: 61.5 % (ref 43.0–77.0)
PLATELETS: 324 10*3/uL (ref 150.0–400.0)
RBC: 5.07 Mil/uL (ref 3.87–5.11)
RDW: 13.2 % (ref 11.5–15.5)
WBC: 10.6 10*3/uL — AB (ref 4.0–10.5)

## 2014-10-07 LAB — BASIC METABOLIC PANEL
BUN: 9 mg/dL (ref 6–23)
CHLORIDE: 106 meq/L (ref 96–112)
CO2: 28 meq/L (ref 19–32)
Calcium: 9 mg/dL (ref 8.4–10.5)
Creatinine, Ser: 0.82 mg/dL (ref 0.40–1.20)
GFR: 96.02 mL/min (ref >60.00)
Glucose, Bld: 78 mg/dL (ref 70–99)
Potassium: 3.5 mEq/L (ref 3.5–5.1)
SODIUM: 140 meq/L (ref 135–145)

## 2014-10-07 NOTE — Progress Notes (Signed)
Patient ID: [name] [name], female   DOB: 05-Dec-1965, 49 y.o.   MRN: [id number]   Subjective:    Patient ID: [name] [name], female    DOB: 1966-04-21, 49 y.o.   MRN: [id number]  HPI [name] [name] is a pleasant 49 year old African-American female new to GI today referred by [name] Nielsen PA-C cardiology for evaluation of chest pressure and dysphagia complaints. Patient had undergone recent cardiac workup including catheterization which was negative. She does have history of hypertension morbid obesity is status post laparoscopic gastric sleeve done at [facility] in June 2014. She says she also had 3 hernias repaired at that time. Surgery was done by Dr. Lily [name]. Also with rheumatoid arthritis, diastolic dysfunction fibromyalgia, and depression. Patient says that she has been having problems over the past several months with intermittent chest pressure which does not seem to be related to eating or to activity she describes it as a tightness and sensation of not being able to get enough air. She has had a pulling pain in her right upper quadrant and in her right lower quadrant both fairly constant and perhaps alleviated by bowel movements. She says she's having more frequent stools with bowel movements post each meal stools are softer but not diarrhea no melena or hematochezia. She has no dysphagia to solid foods but says that with liquids she frequently feels like it's in her esophagus and will gurgle back up. No regular aspirin or NSAIDs. She had lost 80 pounds initially with her gastric sleeve. She is on a daily PPI, currently Protonix 40 mg by mouth daily.  Review of Systems Pertinent positive and negative review of systems were noted in the above HPI section.  All other review of systems was otherwise negative.  Outpatient Encounter Prescriptions as of 10/07/2014  . Order #: [id number] Class: Historical Med  . Order #: [id number] Class: Normal  . Order #: [id number] Class: Normal    . Order #: [id number] Class: Normal  . Order #: [id number] Class: Historical Med  . Order #: [id number] Class: Historical Med  . Order #: [id number] Class: Normal  . Order #: [id number] Class: Historical Med  . Order #: [id number] Class: Normal  . Order #: [id number] Class: Normal  . Order #: [id number] Class: Normal  . Order #: [id number] Class: Historical Med  . Order #: [id number] Class: Print  . Order #: [id number] Class: Normal  . Order #: [id number] Class: Historical Med  . Order #: [id number] Class: Historical Med  . Order #: [id number] Class: Historical Med  . Order #: [id number] Class: Normal  . Order #: [id number] Class: Historical Med  . Order #: [id number] Class: Historical Med  . Order #: [id number] Class: Historical Med  . Order #: [id number] Class: Historical Med  . Order #: [id number] Class: Historical Med  . Order #: [id number] Class: Historical Med  . Order #: [id number] Class: Normal   Allergies  Allergen Reactions  . Flu Virus Vaccine Anaphylaxis  . Influenza Vaccines Anaphylaxis   Patient Active Problem List   Diagnosis Date Noted  . Essential hypertension 08/25/2014  . Morbid obesity 08/25/2014  . CAD (coronary artery disease), native coronary artery 08/24/2014  . Numbness and tingling in left arm   . Chest pressure 08/22/2014  . Arm numbness left 08/22/2014  . Chest pain radiating to jaw 08/22/2014  . Chest pain 08/22/2014  . Coronary artery calcification seen on CAT scan 02/19/2014  . Solitary pulmonary nodule 08/28/2013  . Chest pain, atypical 07/18/2013  .  Heart palpitations 06/27/2013  . Cough 06/04/2013  . Diastolic dysfunction 05/18/2013  . SOB (shortness of breath) 05/09/2013  . Other malaise and fatigue 01/21/2013  . Stress and adjustment reaction 12/09/2012  . Right sided abdominal pain 11/29/2012   History   Social History  . Marital Status: Married    Spouse Name: [name] [name]   . Number of Children: 2  . Years of Education: 16   Occupational History   . CUSTOMER SERVICE [employer]   Social History Main Topics  . Smoking status: Never Smoker   . Smokeless tobacco: Never Used  . Alcohol Use: No  . Drug Use: No  . Sexual Activity: Not on file   Other Topics Concern  . Not on file   Social History Narrative   Marital Status: Married Probation officer)    Children:  Son [name]) Daughter [name])    Pets: None    Living Situation: Lives with husband and children    Occupation: Occupational psychologist Administrator)     Education: Oncologist (Psychology)     Tobacco Use/Exposure:  None    Alcohol Use:  Occasional   Drug Use:  None   Diet:  Regular   Exercise: Walking or Treadmill (2 x week)   Hobbies: Clinical cytogeneticist, [employer].                [name] family history includes Cancer in her father; Diabetes in her maternal grandmother; Heart attack in her paternal grandmother; Hyperlipidemia in her mother; Hypertension in her maternal grandmother and paternal grandmother; Lung disease in her father; Sarcoidosis in her father. There is no history of Stroke.      Objective:    Filed Vitals:   10/07/14 0909  BP: 110/72  Pulse: 72    Physical Exam  well-developed obese African-American female in no acute distress, quite pleasant blood pressure 110/72 pulse 72 height 5 foot 2 weight 248 BMI 45.3. HEENT ;nontraumatic normocephalic EOMI PERRLA sclera anicteric, Supple; no JVD, Cardiovascular; regular rate and rhythm with S1-S2 no murmur or gallop, Pulmonary; clear bilaterally, Abdomen ;obese soft she is tender in the right upper quadrant right lower quadrant and mid abdomen there is no guarding or rebound no palpable mass or hepatosplenomegaly bowel sounds are present, Rectal; exam not done, Extremities; no clubbing cyanosis or edema skin warm and dry, Psych; mood and affect appropriate       Assessment & Plan:   #1 49 yo female with dysphagia -primarily to liquids and intermittent chest pressure with  negative carsiac evaluation including cardiac cath R/O esophageal motility disorder ? Achalasia or esophageal spasms #2 s/p Gastric sleeve 6/2014[facility] #3 s/p umbilical and hiatal hernia repair 11/2012 #4Morbid obesity #5 HTN #6 RA #7 Fibromyalgia #8 change in bowel habits #9 Right upper and lower abdominal pain x 3-4 months-etiology not clear  Plan;Schedule for Ba swallow with tablet Schedule for Ct Abd/Pelvis  CBC, BMET May need EGD/Colon if above studies unrevealing- she will be established with Dr. Leatha Gilding PA-C 10/07/2014   Cc: [name], MD

## 2014-10-07 NOTE — Progress Notes (Signed)
Agree with initial assessment and plans as outlined 

## 2014-10-07 NOTE — Patient Instructions (Signed)
Please go to the basement level to have your labs drawn.  You have been scheduled for a Barium Esophogram at [facility] (1st floor of the hospital) on  10-09-2014 at . Please arrive 15 minutes prior to your appointment for registration. Make certain not to have anything to eat or drink 3 hours prior to your test. If you need to reschedule for any reason, please contact radiology at [phone] to do so. __________________________________________________________________ A barium swallow is an examination that concentrates on views of the esophagus. This tends to be a double contrast exam (barium and two liquids which, when combined, create a gas to distend the wall of the oesophagus) or single contrast (non-ionic iodine based). The study is usually tailored to your symptoms so a good history is essential. Attention is paid during the study to the form, structure and configuration of the esophagus, looking for functional disorders (such as aspiration, dysphagia, achalasia, motility and reflux) EXAMINATION You may be asked to change into a gown, depending on the type of swallow being performed. A radiologist and radiographer will perform the procedure. The radiologist will advise you of the type of contrast selected for your procedure and direct you during the exam. You will be asked to stand, sit or lie in several different positions and to hold a small amount of fluid in your mouth before being asked to swallow while the imaging is performed .In some instances you may be asked to swallow barium coated marshmallows to assess the motility of a solid food bolus. The exam can be recorded as a digital or video fluoroscopy procedure. POST PROCEDURE It will take 1-2 days for the barium to pass through your system. To facilitate this, it is important, unless otherwise directed, to increase your fluids for the next 24-48hrs and to resume your normal diet.  This test typically takes about 30 minutes to  perform. ________________________________________________________________________________ [name] [name] have been scheduled for a CT scan of the abdomen and pelvis at [facility] ([address]---this is in the same building as Press photographer).   You are scheduled on  10-08-2014 at 2:00 Pm . You should arrive at 1:45 PM  prior to your appointment time for registration. Please follow the written instructions below on the day of your exam:  WARNING: IF YOU ARE ALLERGIC TO IODINE/X-RAY DYE, PLEASE NOTIFY RADIOLOGY IMMEDIATELY AT [phone]! YOU WILL BE GIVEN A 13 HOUR PREMEDICATION PREP.  1) Do not eat or drink anything after  10:00 am  (4 hours prior to your test) 2) You have been given 2 bottles of oral contrast to drink. The solution may taste better if refrigerated, but do NOT add ice or any other liquid to this solution. Shake ell before drinking.    Drink 1 bottle of contrast $RemoveBef'@[id number]$ :00 Noon (2 hours prior to your exam)  Drink 1 bottle of contrast @ 1:00 PM (1 hour prior to your exam)  You may take any medications as prescribed with a small amount of water except for the following: Metformin, Glucophage, Glucovance, Avandamet, Riomet, Fortamet, Actoplus Met, Janumet, Glumetza or Metaglip. The above medications must be held the day of the exam AND 48 hours after the exam.  The purpose of you drinking the oral contrast is to aid in the visualization of your intestinal tract. The contrast solution may cause some diarrhea. Before your exam is started, you will be given a small amount of fluid to drink. Depending on your individual set of symptoms, you may also  receive an intravenous injection of x-ray contrast/dye. Plan on being at [facility] for 30 minutes or long, depending on the type of exam you are having performed.  If you have any questions regarding your exam or if you need to reschedule, you may call the CT department at [phone] between the hours of 8:00 am and 5:00 pm,  Monday-Friday.  __Continue the Protonix 40 mg daily.______________________________________________________________________

## 2014-10-08 ENCOUNTER — Ambulatory Visit (INDEPENDENT_AMBULATORY_CARE_PROVIDER_SITE_OTHER)
Admission: RE | Admit: 2014-10-08 | Discharge: 2014-10-08 | Disposition: A | Discharge status: Home / Self Care | Payer: Managed Care, Other (non HMO) | Source: Ambulatory Visit | Attending: Physician Assistant | Admitting: Physician Assistant

## 2014-10-08 DIAGNOSIS — G8929 Other chronic pain: Secondary | ICD-10-CM | POA: Diagnosis not present

## 2014-10-08 DIAGNOSIS — R194 Change in bowel habit: Secondary | ICD-10-CM | POA: Diagnosis not present

## 2014-10-08 DIAGNOSIS — R1010 Upper abdominal pain, unspecified: Secondary | ICD-10-CM

## 2014-10-08 DIAGNOSIS — R1011 Right upper quadrant pain: Principal | ICD-10-CM

## 2014-10-08 DIAGNOSIS — R1031 Right lower quadrant pain: Secondary | ICD-10-CM | POA: Diagnosis not present

## 2014-10-08 MED ORDER — IOHEXOL 300 MG/ML  SOLN
100.0000 mL | Freq: Once | INTRAMUSCULAR | Status: AC | PRN
  Administered 2014-10-08: 100 mL via INTRAVENOUS

## 2014-10-09 ENCOUNTER — Ambulatory Visit (HOSPITAL_COMMUNITY)
Admission: RE | Admit: 2014-10-09 | Discharge: 2014-10-09 | Disposition: A | Discharge status: Home / Self Care | Payer: Managed Care, Other (non HMO) | Source: Ambulatory Visit | Attending: Physician Assistant | Admitting: Physician Assistant

## 2014-10-09 DIAGNOSIS — R1314 Dysphagia, pharyngoesophageal phase: Secondary | ICD-10-CM

## 2014-10-09 DIAGNOSIS — R0789 Other chest pain: Secondary | ICD-10-CM | POA: Insufficient documentation

## 2014-10-10 ENCOUNTER — Telehealth: Payer: Self-pay | Admitting: Physician Assistant

## 2014-10-10 NOTE — Telephone Encounter (Signed)
Advised the patient I will call her asap with the results and recommendations on her Be swallow.

## 2014-10-11 ENCOUNTER — Other Ambulatory Visit: Payer: Self-pay

## 2014-10-11 MED ORDER — HYOSCYAMINE SULFATE 0.125 MG SL SUBL: 0.1250 mg | SUBLINGUAL_TABLET | Freq: Four times a day (QID) | SUBLINGUAL | Status: DC | PRN

## 2014-10-13 ENCOUNTER — Telehealth: Payer: Self-pay | Admitting: Gastroenterology

## 2014-10-13 NOTE — Telephone Encounter (Signed)
The chest pains are what I wanted her to try levsin for

## 2014-10-13 NOTE — Telephone Encounter (Signed)
She called with her chest pains (pressure and burning).  Have been evaluated by cardiology (including cath) and felt to be "non-cardiac."  She tells me it is usually post prandial thinks it may be GERD related.  I recommended she try taking an extra protonix today and also take 2 protonix tomorrow as well.

## 2014-10-15 ENCOUNTER — Telehealth: Payer: Self-pay | Admitting: Physician Assistant

## 2014-10-15 NOTE — Telephone Encounter (Signed)
Levsin is not helping. She is having chest discomfort that takes her breath away. No sooner appointments with her primary GI, Dr Marina Goodell. She is frustrated. Appointment made with Amy.

## 2014-10-16 ENCOUNTER — Ambulatory Visit (INDEPENDENT_AMBULATORY_CARE_PROVIDER_SITE_OTHER): Payer: Managed Care, Other (non HMO) | Admitting: Physician Assistant

## 2014-10-16 ENCOUNTER — Encounter: Payer: Self-pay | Admitting: Physician Assistant

## 2014-10-16 VITALS — BP 100/62 | HR 72 | Ht 61.25 in | Wt 246.1 lb

## 2014-10-16 DIAGNOSIS — R1314 Dysphagia, pharyngoesophageal phase: Secondary | ICD-10-CM | POA: Diagnosis not present

## 2014-10-16 DIAGNOSIS — R1310 Dysphagia, unspecified: Secondary | ICD-10-CM | POA: Diagnosis not present

## 2014-10-16 DIAGNOSIS — Z9884 Bariatric surgery status: Secondary | ICD-10-CM | POA: Diagnosis not present

## 2014-10-16 MED ORDER — PANTOPRAZOLE SODIUM 40 MG PO TBEC: 40.0000 mg | DELAYED_RELEASE_TABLET | Freq: Two times a day (BID) | ORAL | Status: DC

## 2014-10-16 MED ORDER — SUCRALFATE 1 GM/10ML PO SUSP: 1.0000 g | Freq: Three times a day (TID) | ORAL | Status: DC

## 2014-10-16 NOTE — Progress Notes (Signed)
Patient ID: [name] [name], female   DOB: 01/11/1966, 49 y.o.   MRN: [id number]   Subjective:    Patient ID: [name] [name], female    DOB: 01/16/1966, 49 y.o.   MRN: [id number]  HPI [name] [name] is a 49 year old African-American female who was seen less than 2 weeks ago in initial consultation after being referred by French Ana Turner/cardiology for evaluation of chest pressure and dysphagia. She had undergone cardiac workup including catheterization which was negative. His have history of hypertension, morbid obesity and is status post a laparoscopic gastric sleeve done at [facility] in 2014. She has rheumatoid arthritis, fibromyalgia and depression. She had been having problems over the past few months with chest pressure and discomfort which is worse with by mouth intake and a sensation of liquid dysphagia. She had been started on Protonix 40 mg by mouth daily and we pursued barium swallow. This showed a moderate hiatal hernia, gross reflux and a nonspecific esophageal dysmotility there was no stricture. She also had CT of the abdomen and pelvis done because of complaints of upper abdominal pain. CT of the abdomen and pelvis and a small hiatal hernia postoperative findings in the stomach and several small right ovarian cysts mild cardiomegaly and an umbilical hernia containing adipose tissue. Patient had been called in Levsin sublingual to use for episodes of increased chest pressure. She said she tried this without any benefit. She comes back today feeling no better with his symptoms with every meal of pressure and discomfort as well as nausea and queasiness but no vomiting. She has some difficulty with solids and liquids no regular vomiting or regurgitation. Her appetite is been okay and her weight has been stable.  Review of Systems Pertinent positive and negative review of systems were noted in the above HPI section.  All other review of systems was otherwise negative.  Outpatient Encounter  Prescriptions as of 10/16/2014  Medication Sig  . Abatacept (ORENCIA IV) Inject 1,000 mg into the vein every 28 (twenty-eight) days.   . Acyclovir-Hydrocortisone (XERESE) 5-1 % CREA Apply 1 application topically every 24 hours x 5 doses.  Marland Kitchen atorvastatin (LIPITOR) 80 MG tablet Take 1 tablet (80 mg total) by mouth daily at 6 PM.  . benzonatate (TESSALON) 200 MG capsule Take 1 capsule (200 mg total) by mouth 3 (three) times daily as needed for cough.  Marland Kitchen buPROPion (WELLBUTRIN XL) 300 MG 24 hr tablet Take 300 mg by mouth every morning.  . cyclobenzaprine (FLEXERIL) 10 MG tablet Take 10 mg by mouth 3 (three) times daily as needed for muscle spasms.  Marland Kitchen diltiazem (CARTIA XT) 180 MG 24 hr capsule Take 1 capsule (180 mg total) by mouth daily. (Patient taking differently: Take 180 mg by mouth at bedtime. )  . DULoxetine (CYMBALTA) 60 MG capsule Take 60 mg by mouth 3 (three) times daily.  Marland Kitchen EPINEPHrine 0.3 mg/0.3 mL IJ SOAJ injection Inject 0.3 mLs (0.3 mg total) into the muscle once.  . famciclovir (FAMVIR) 500 MG tablet Take three tablets at onset of fever blisters x 1 day  . fluticasone (FLONASE) 50 MCG/ACT nasal spray Place 2 sprays into both nostrils as needed for allergies.  Marland Kitchen gabapentin (NEURONTIN) 100 MG capsule Take 100 mg by mouth 3 (three) times daily.   Marland Kitchen HYDROcodone-acetaminophen (NORCO/VICODIN) 5-325 MG per tablet Take 1 tablet by mouth every 6 (six) hours as needed for pain.  . hydrOXYzine (VISTARIL) 25 MG capsule Take 1 capsule BID prn for increased anxiety  . hyoscyamine (LEVSIN/SL)  0.125 MG SL tablet Place 1 tablet (0.125 mg total) under the tongue every 6 (six) hours as needed for cramping.  Marland Kitchen levothyroxine (SYNTHROID, LEVOTHROID) 25 MCG tablet   . lidocaine (LIDODERM) 5 % Place 1 patch onto the skin as needed (for pain).   . methotrexate 25 MG/ML injection Inject 1 mL into the muscle every Tuesday.   . pantoprazole (PROTONIX) 40 MG tablet Take 1 tablet (40 mg total) by mouth 2 (two) times  daily.  Marland Kitchen SILENOR 6 MG TABS   . TL GARD RX 2.2-25-1 MG TABS   . traMADol (ULTRAM) 50 MG tablet   . TROKENDI XR 50 MG CP24   . Vitamin D, Ergocalciferol, (DRISDOL) 50000 UNITS CAPS capsule Take 50,000 Units by mouth every Monday, Wednesday, and Friday.  . VOLTAREN 1 % GEL Apply 2 g topically daily as needed (for pain).   . [DISCONTINUED] pantoprazole (PROTONIX) 40 MG tablet Take 1 tablet (40 mg total) by mouth daily at 12 noon.  . cetirizine (ZYRTEC) 10 MG tablet Take 1 tablet (10 mg total) by mouth daily.  . sucralfate (CARAFATE) 1 GM/10ML suspension Take 10 mLs (1 g total) by mouth 4 (four) times daily -  with meals and at bedtime.   No facility-administered encounter medications on file as of 10/16/2014.   Allergies  Allergen Reactions  . Flu Virus Vaccine Anaphylaxis  . Influenza Vaccines Anaphylaxis   Patient Active Problem List   Diagnosis Date Noted  . Essential hypertension 08/25/2014  . Morbid obesity 08/25/2014  . CAD (coronary artery disease), native coronary artery 08/24/2014  . Numbness and tingling in left arm   . Chest pressure 08/22/2014  . Arm numbness left 08/22/2014  . Chest pain radiating to jaw 08/22/2014  . Chest pain 08/22/2014  . Coronary artery calcification seen on CAT scan 02/19/2014  . Solitary pulmonary nodule 08/28/2013  . Chest pain, atypical 07/18/2013  . Heart palpitations 06/27/2013  . Cough 06/04/2013  . Diastolic dysfunction 05/18/2013  . SOB (shortness of breath) 05/09/2013  . Other malaise and fatigue 01/21/2013  . Stress and adjustment reaction 12/09/2012  . Right sided abdominal pain 11/29/2012   History   Social History  . Marital Status: Married    Spouse Name: [name]   . Number of Children: 2  . Years of Education: 16   Occupational History  . CUSTOMER SERVICE [employer]   Social History Main Topics  . Smoking status: Never Smoker   . Smokeless tobacco: Never Used  . Alcohol Use: No  . Drug Use: No  . Sexual  Activity: Not on file   Other Topics Concern  . Not on file   Social History Narrative   Marital Status: Married Probation officer)    Children:  Son [name]) Daughter [name])    Pets: None    Living Situation: Lives with husband and children    Occupation: Occupational psychologist Administrator)     Education: Oncologist (Psychology)     Tobacco Use/Exposure:  None    Alcohol Use:  Occasional   Drug Use:  None   Diet:  Regular   Exercise: Walking or Treadmill (2 x week)   Hobbies: Clinical cytogeneticist, [employer].                Ms. Mysliwiec family history includes Cancer in her father; Diabetes in her maternal grandmother; Heart attack in her paternal grandmother; Hyperlipidemia in her mother; Hypertension in her maternal grandmother and paternal grandmother; Lung disease in her father;  Sarcoidosis in her father. There is no history of Stroke.      Objective:    Filed Vitals:   10/16/14 1424  BP: 100/62  Pulse: 72    Physical Exam  well-developed obese African-American female in no acute distress, pleasant blood pressure 100/62 pulse 72 height 5 foot 1 weight 246, BMI 46 not further examined today discussion only       Assessment & Plan:   #1 49 yo female with several month hx of chest pain/pressure exacerbated by eating and liquids and solid dysphagia- pt is s/p gastric sleeve bariatric surgery 2014 Nonspecific dysmotility on barium swallow. No stricture- need to r/o motility disorder, no evidence for achalasia by barium swallow. R/o severe GERD, R/O eosinophilic esophagitis #2 morbid obesity #3 HTN  Plan; Schedule for EGD with Dr. Marina Goodell. Procedure discussed with pt and she is agreeable to proceed. Schedule for esophageal manometry  Continue Protonix 40 mg ,and increase to BID Add Carafate liquid 1gm between meals and hs       Amy S Esterwood PA-C 10/16/2014   Cc: [name], MD

## 2014-10-16 NOTE — Patient Instructions (Addendum)
We sent prescriptions to [facility] [address] 1. Pantoprazole sodium 40 mg twice daily dosage. 2. Carafate liquid.  You have been scheduled for an endoscopy. Please follow written instructions given to you at your visit today. If you use inhalers (even only as needed), please bring them with you on the day of your procedure. Your physician has requested that you go to www.startemmi.com and enter the access code given to you at your visit today. This web site gives a general overview about your procedure. However, you should still follow specific instructions given to you by our office regarding your preparation for the procedure.  You have been scheduled for an esophageal manometry at [facility] on 10-28-2014 at 12:30 pm . Please arrive at 12:15 pm  prior to your procedure for registration. You will need to go to outpatient registration (1st floor of the hospital) first. Make certain to bring your insurance cards as well as a complete list of medications.  Please remember the following:  1) Nothing to eat or drink after 6:30 AM.  2) Hold all diabetic medications/insulin the morning of the test. You may eat and take  your medications after the test.  3) For 3 days prior to your test do not take: Dexilant, Prevacid, Nexium, Protonix, Aciphex, Zegerid, Pantoprazole, Prilosec or omeprazole.  4) For 2 days prior to your test, do not take: Reglan, Tagamet, Zantac, Axid or Pepcid.  5) You MAY use an antacid such as Rolaids or Tums up to 12 hours prior to your test.  It will take at least 2 weeks to receive the results of this test from your physician. ------------------------------------------ ABOUT ESOPHAGEAL MANOMETRY Esophageal manometry (muh-NOM-uh-tree) is a test that gauges how well your esophagus works. Your esophagus is the long, muscular tube that connects your throat to your stomach. Esophageal manometry measures the rhythmic muscle contractions (peristalsis) that occur in your  esophagus when you swallow. Esophageal manometry also measures the coordination and force exerted by the muscles of your esophagus.  During esophageal manometry, a thin, flexible tube (catheter) that contains sensors is passed through your nose, down your esophagus and into your stomach. Esophageal manometry can be helpful in diagnosing some mostly uncommon disorders that affect your esophagus.  Why it's done Esophageal manometry is used to evaluate the movement (motility) of food through the esophagus and into the stomach. The test measures how well the circular bands of muscle (sphincters) at the top and bottom of your esophagus open and close, as well as the pressure, strength and pattern of the wave of esophageal muscle contractions that moves food along.  What you can expect Esophageal manometry is an outpatient procedure done without sedation. Most people tolerate it well. You may be asked to change into a hospital gown before the test starts.  During esophageal manometry  While you are sitting up, a member of your health care team sprays your throat with a numbing medication or puts numbing gel in your nose or both.  A catheter is guided through your nose into your esophagus. The catheter may be sheathed in a water-filled sleeve. It doesn't interfere with your breathing. However, your eyes may water, and you may gag. You may have a slight nosebleed from irritation.  After the catheter is in place, you may be asked to lie on your back on an exam table, or you may be asked to remain seated.  You then swallow small sips of water. As you do, a computer connected to the catheter  records the pressure, strength and pattern of your esophageal muscle contractions.  During the test, you'll be asked to breathe slowly and smoothly, remain as still as possible, and swallow only when you're asked to do so.  A member of your health care team may move the catheter down into your stomach while the catheter  continues its measurements.  The catheter then is slowly withdrawn. The test usually lasts 20 to 30 minutes.  After esophageal manometry  When your esophageal manometry is complete, you may return to your normal activities  This test typically takes 30-45 minutes to complete. ________________________________________________________________________________

## 2014-10-17 NOTE — Progress Notes (Signed)
Greer with initial assessment and plans as outlined

## 2014-10-17 NOTE — Progress Notes (Signed)
Clear was causing her symptoms. Agree with endoscopy and manometry plans to further workup for GI causes

## 2014-10-22 ENCOUNTER — Other Ambulatory Visit: Payer: Managed Care, Other (non HMO)

## 2014-10-22 ENCOUNTER — Ambulatory Visit
Admission: RE | Admit: 2014-10-22 | Discharge: 2014-10-22 | Disposition: A | Discharge status: Home / Self Care | Payer: 59 | Source: Ambulatory Visit | Attending: Orthopaedic Surgery | Admitting: Orthopaedic Surgery

## 2014-10-22 DIAGNOSIS — M545 Low back pain: Secondary | ICD-10-CM

## 2014-10-23 ENCOUNTER — Other Ambulatory Visit: Payer: Managed Care, Other (non HMO)

## 2014-10-28 ENCOUNTER — Encounter (HOSPITAL_COMMUNITY)
Admission: RE | Disposition: A | Discharge status: Home / Self Care | Payer: Self-pay | Source: Ambulatory Visit | Attending: Internal Medicine

## 2014-10-28 ENCOUNTER — Ambulatory Visit (HOSPITAL_COMMUNITY)
Admission: RE | Admit: 2014-10-28 | Discharge: 2014-10-28 | Disposition: A | Discharge status: Home / Self Care | Payer: Managed Care, Other (non HMO) | Source: Ambulatory Visit | Attending: Internal Medicine | Admitting: Internal Medicine

## 2014-10-28 DIAGNOSIS — R52 Pain, unspecified: Secondary | ICD-10-CM | POA: Diagnosis not present

## 2014-10-28 DIAGNOSIS — K224 Dyskinesia of esophagus: Secondary | ICD-10-CM | POA: Diagnosis not present

## 2014-10-28 HISTORY — PX: ESOPHAGEAL MANOMETRY: SHX5429

## 2014-10-28 SURGERY — MANOMETRY, ESOPHAGUS

## 2014-10-28 MED ORDER — LIDOCAINE VISCOUS 2 % MT SOLN
OROMUCOSAL | Status: AC
  Filled 2014-10-28: qty 15

## 2014-10-28 SURGICAL SUPPLY — 2 items
FACESHIELD LNG OPTICON STERILE (SAFETY) IMPLANT
GLOVE BIO SURGEON STRL SZ8 (GLOVE) ×4 IMPLANT

## 2014-10-29 ENCOUNTER — Encounter (HOSPITAL_COMMUNITY): Payer: Self-pay | Admitting: Internal Medicine

## 2014-10-30 ENCOUNTER — Encounter (HOSPITAL_COMMUNITY)
Admission: RE | Admit: 2014-10-30 | Discharge: 2014-10-30 | Disposition: A | Discharge status: Home / Self Care | Payer: Managed Care, Other (non HMO) | Source: Ambulatory Visit | Attending: Rheumatology | Admitting: Rheumatology

## 2014-10-30 DIAGNOSIS — M069 Rheumatoid arthritis, unspecified: Secondary | ICD-10-CM | POA: Insufficient documentation

## 2014-10-30 LAB — CBC WITH DIFFERENTIAL/PLATELET
BASOS PCT: 1 % (ref 0–1)
Basophils Absolute: 0.1 10*3/uL (ref 0.0–0.1)
Eosinophils Absolute: 0.1 10*3/uL (ref 0.0–0.7)
Eosinophils Relative: 2 % (ref 0–5)
HCT: 44.3 % (ref 36.0–46.0)
HEMOGLOBIN: 14 g/dL (ref 12.0–15.0)
Lymphocytes Relative: 38 % (ref 12–46)
Lymphs Abs: 3.4 10*3/uL (ref 0.7–4.0)
MCH: 27 pg (ref 26.0–34.0)
MCHC: 31.6 g/dL (ref 30.0–36.0)
MCV: 85.4 fL (ref 78.0–100.0)
Monocytes Absolute: 0.6 10*3/uL (ref 0.1–1.0)
Monocytes Relative: 7 % (ref 3–12)
Neutro Abs: 4.9 10*3/uL (ref 1.7–7.7)
Neutrophils Relative %: 54 % (ref 43–77)
Platelets: 290 10*3/uL (ref 150–400)
RBC: 5.19 MIL/uL — AB (ref 3.87–5.11)
RDW: 13.2 % (ref 11.5–15.5)
WBC: 9.1 10*3/uL (ref 4.0–10.5)

## 2014-10-30 LAB — COMPREHENSIVE METABOLIC PANEL
ALK PHOS: 76 U/L (ref 38–126)
ALT: 15 U/L (ref 14–54)
ANION GAP: 9 (ref 5–15)
AST: 17 U/L (ref 15–41)
Albumin: 3.2 g/dL — ABNORMAL LOW (ref 3.5–5.0)
BILIRUBIN TOTAL: 0.7 mg/dL (ref 0.3–1.2)
BUN: 12 mg/dL (ref 6–20)
CALCIUM: 9 mg/dL (ref 8.9–10.3)
CO2: 25 mmol/L (ref 22–32)
Chloride: 106 mmol/L (ref 101–111)
Creatinine, Ser: 0.98 mg/dL (ref 0.44–1.00)
GLUCOSE: 93 mg/dL (ref 65–99)
Potassium: 4.2 mmol/L (ref 3.5–5.1)
SODIUM: 140 mmol/L (ref 135–145)
Total Protein: 6.9 g/dL (ref 6.5–8.1)

## 2014-10-30 MED ORDER — DIPHENHYDRAMINE HCL 25 MG PO CAPS: 50.0000 mg | ORAL_CAPSULE | ORAL | Status: DC

## 2014-10-30 MED ORDER — ACETAMINOPHEN 325 MG PO TABS: 650.0000 mg | ORAL_TABLET | ORAL | Status: DC

## 2014-10-30 MED ORDER — SODIUM CHLORIDE 0.9 % IV SOLN
INTRAVENOUS | Status: DC
Start: 2014-10-30 — End: 2014-10-31
  Administered 2014-10-30: 11:00:00 via INTRAVENOUS

## 2014-10-30 MED ORDER — SODIUM CHLORIDE 0.9 % IV SOLN
1000.0000 mg | INTRAVENOUS | Status: DC
  Administered 2014-10-30: 1000 mg via INTRAVENOUS
  Filled 2014-10-30: qty 40

## 2014-10-31 DIAGNOSIS — K224 Dyskinesia of esophagus: Secondary | ICD-10-CM

## 2014-10-31 DIAGNOSIS — R079 Chest pain, unspecified: Secondary | ICD-10-CM

## 2014-10-31 NOTE — Procedures (Signed)
Patient: [name] [name], [name]   [id number] Gender: Female Physician: Dr. Yancey Flemings (ordering) Dr. Stan Head (reading)   DOB / Age: [birth year] Operator: Tillie Fantasia, RN, BSN   Height: 5 ft 1 in Referring Physician:    Procedure: EMZ Examination Date: 10/28/2014    Swallow Composite (mean of 10 swallows) Resting Pressure Profile & Anatomy     Basal Pressures* LES, respiratory mean(mmHg) 43.5 (13-43) UES mean(mmHg) 82.1 (34-104)  Anatomy* LES proximal(cm) 34.2 LES intraabdominal(cm) 0.0 Esophageal length(cm) 17.9 Hiatal hernia Yes, 2.7    Motility* Distal contr. integral(mmHg-cm-s) 2702.8 ([id number]) Distal contr. int. (highest)(mmHg-cm-s) 4621.2 Incomplete bolus clearance(%) 0  Residual Pressures* LES (mean)(mmHg) -0.0 (<15.0) UES (mean)(mmHg) 2.6 (<12.0)  *Notes. Motility values are mean among swallows; Normal values in (xxx.x):  Simultaneous contractions: Velocity > 8.0 cm/s; eSlv: eSleeve; 3SN, IRP, DCI, IBP - See manual definitions  Lower Esophageal Sphincter Region  Normal Esophageal Motility  Normal  Landmarks   Number of swallows evaluated 10       Proximal LES (from nares)(cm) 34.2  High Resolution Parameters        LES length(cm) 2.9 2.7-4.8     Distal contractile integral(mean)(mmHg-cm-s) 2702.8 [id number]      Esophageal length (LES-UES centers)(cm) 17.9      Distal contractile integral(highest)(mmHg-cm-s) 4621.2       Intraabdominal LES length(cm) 0.0      Contractile front velocity(cm/s) 2.3 <9.0      Hiatal hernia? Yes, 2.7  Chicago Classification    LES Pressures       Distal latency 6.0       Pressure meas. method eSleeve,IRP      % failed (Chicago Classification) 0       Basal (respiratory min.)(mmHg) 32.5 4.8-32.0     % panesophageal pressurization 0       Basal (respiratory mean)(mmHg) 43.5 13-43     % premature contraction 0       Residual (mean)(mmHg) -0.0 <15.0     % rapid contraction 0          % large breaks 0          % small breaks 0    Impedance analysis           Incomplete bolus clearance(%) 0          Upper Esophageal Sphincter  Normal Pharyngeal / UES Motility  Normal  Mean basal pressure(mmHg) 82.1 34-104 No. swallows evaluated 10   Mean residual pressure(mmHg) 2.6 <12.0 Evaluated @ 3.0 & N/A above UES           Mean peak pressure(mmHg) 18.8       Chicago Classification Findings*  No Chicago Classification abnormality found * Findings are based on published Chicago Classification scheme and are only intended to serve as a guide for patient diagnosis   Procedure  After confirmation of potential allergies, a topical analgesic was used to numb the nares followed by trans-nasal insertion of a High Resolution Manometry Catheter. Pressure bands of both UES and LES were observed on the color contour. Patient instructed to take deep breath to verify placement of catheter; diaphragmatic pinch noted on inspiration. Patient was assisted to supine position and catheter was stabilized. Patient encouraged to relax while acclimating to catheter for approximately 5 minutes. A 30 second baseline pressure was obtained to identify the UES and LES followed by a series of wet swallows using 5 ccs room temperature water to assess esophageal motility. At the conclusion of the procedure; the catheter was  removed.   Indications  Chest pain Esophageal dysmotility on barium swallow    Interpretation / Findings  Normal esophageal manometry    Gaynell Face, MD, [facility] 11/01/2014 1620 hrs

## 2014-11-05 ENCOUNTER — Telehealth: Payer: Self-pay

## 2014-11-05 NOTE — Telephone Encounter (Signed)
Let pt know manometry is normal. Keep plans for EGD as scheduled    ----- Message -----     From: Iva Boop, MD     Sent: 10/31/2014  4:25 PM      To: Amy Oswald Hillock, PA-C, Hilarie Fredrickson, MD    Spoke with pt and she is aware.

## 2014-11-11 ENCOUNTER — Ambulatory Visit (AMBULATORY_SURGERY_CENTER): Payer: 59 | Admitting: Internal Medicine

## 2014-11-11 ENCOUNTER — Encounter: Payer: Self-pay | Admitting: Internal Medicine

## 2014-11-11 VITALS — BP 99/55 | HR 72 | Temp 96.6°F | Resp 24 | Ht 61.25 in | Wt 246.0 lb

## 2014-11-11 DIAGNOSIS — R1314 Dysphagia, pharyngoesophageal phase: Secondary | ICD-10-CM | POA: Diagnosis present

## 2014-11-11 DIAGNOSIS — R1319 Other dysphagia: Secondary | ICD-10-CM

## 2014-11-11 DIAGNOSIS — R1310 Dysphagia, unspecified: Secondary | ICD-10-CM

## 2014-11-11 MED ORDER — SODIUM CHLORIDE 0.9 % IV SOLN: 500.0000 mL | INTRAVENOUS | Status: DC

## 2014-11-11 NOTE — Progress Notes (Signed)
A/ox3, pleased with MAC, report to RN 

## 2014-11-11 NOTE — Progress Notes (Signed)
Pt had several bug bites to legs and arms

## 2014-11-11 NOTE — Patient Instructions (Signed)
YOU HAD AN ENDOSCOPIC PROCEDURE TODAY AT THE [facility]:   Refer to the procedure report that was given to you for any specific questions about what was found during the examination.  If the procedure report does not answer your questions, please call your gastroenterologist to clarify.  If you requested that your care partner not be given the details of your procedure findings, then the procedure report has been included in a sealed envelope for you to review at your convenience later.  YOU SHOULD EXPECT: Some feelings of bloating in the abdomen. Passage of more gas than usual.  Walking can help get rid of the air that was put into your GI tract during the procedure and reduce the bloating.   Please Note:  You might notice some irritation and congestion in your nose or some drainage.  This is from the oxygen used during your procedure.  There is no need for concern and it should clear up in a day or so.  SYMPTOMS TO REPORT IMMEDIATELY:    Following upper endoscopy (EGD)  Vomiting of blood or coffee ground material  New chest pain or pain under the shoulder blades  Painful or persistently difficult swallowing  New shortness of breath  Fever of 100F or higher  Black, tarry-looking stools  For urgent or emergent issues, a gastroenterologist can be reached at any hour by calling [phone].   DIET: Your first meal following the procedure should be a small meal and then it is ok to progress to your normal diet. Heavy or fried foods are harder to digest and may make you feel nauseous or bloated.  Likewise, meals heavy in dairy and vegetables can increase bloating.  Drink plenty of fluids but you should avoid alcoholic beverages for 24 hours.  ACTIVITY:  You should plan to take it easy for the rest of today and you should NOT DRIVE or use heavy machinery until tomorrow (because of the sedation medicines used during the test).    FOLLOW UP: Our staff will call the number listed  on your records the next business day following your procedure to check on you and address any questions or concerns that you may have regarding the information given to you following your procedure. If we do not reach you, we will leave a message.  However, if you are feeling well and you are not experiencing any problems, there is no need to return our call.  We will assume that you have returned to your regular daily activities without incident.  If any biopsies were taken you will be contacted by phone or by letter within the next 1-3 weeks.  Please call us at [phone] if you have not heard about the biopsies in 3 weeks.    SIGNATURES/CONFIDENTIALITY: You and/or your care partner have signed paperwork which will be entered into your electronic medical record.  These signatures attest to the fact that that the information above on your After Visit Summary has been reviewed and is understood.  Full responsibility of the confidentiality of this discharge information lies with you and/or your care-partner. 

## 2014-11-11 NOTE — Op Note (Signed)
[facility] [address] [place], [address]   ENDOSCOPY PROCEDURE REPORT  PATIENT: [name] [name], [name] [name]  MR#: [id number] BIRTHDATE: 27-Apr-1966 , 49  yrs. old GENDER: female ENDOSCOPIST: Roxy Cedar, MD REFERRED BY:  Armanda Magic, M.D. PROCEDURE DATE:  11/11/2014 PROCEDURE:  EGD, diagnostic ASA CLASS:     Class II INDICATIONS:  chest pain and dysphagia. MEDICATIONS: Monitored anesthesia care and Propofol 100 mg IV TOPICAL ANESTHETIC: none  DESCRIPTION OF PROCEDURE: After the risks benefits and alternatives of the procedure were thoroughly explained, informed consent was obtained.  The LB [id number] endoscope was introduced through the mouth and advanced to the second portion of the duodenum , Without limitations.  The instrument was slowly withdrawn as the mucosa was fully examined.   EXAM:Esophagus revealed a large caliber distal ring but was otherwise normal.  Stomach revealed evidence of prior gastric sleeve procedure.  Widely patent pylorus and normal duodenum. Retroflexed views revealed a hiatal hernia.     The scope was then withdrawn from the patient and the procedure completed.  COMPLICATIONS: There were no immediate complications.  ENDOSCOPIC IMPRESSION: 1. Incidental large caliber distal esophageal ring and small sliding hiatal hernia 2. Evidence of prior gastric sleeve procedure 3. No specific cause for postprandial chest complaints found. Negative extensive workup including today's upper endoscopy as well as previous abdominal CT scan, esophagram, esophageal manometry, prior chest CT, empiric therapies without suppressive medications and anti-spasmodics  RECOMMENDATIONS: 1. Continue current medications 2. Return to the care of Dr. Alberteen Sam  REPEAT EXAM:  eSigned:  Roxy Cedar, MD 11/11/2014 2:27 PM    CC:The Patient, Armanda Magic MD, and Birdena Jubilee MD

## 2014-11-12 ENCOUNTER — Telehealth: Payer: Self-pay | Admitting: *Deleted

## 2014-11-12 NOTE — Telephone Encounter (Signed)
Message left on f/u callback 

## 2014-11-15 ENCOUNTER — Ambulatory Visit: Payer: Managed Care, Other (non HMO) | Admitting: Internal Medicine

## 2014-11-17 ENCOUNTER — Other Ambulatory Visit: Payer: Self-pay | Admitting: Physician Assistant

## 2014-11-24 ENCOUNTER — Emergency Department (HOSPITAL_COMMUNITY)
Admission: EM | Admit: 2014-11-24 | Discharge: 2014-11-24 | Disposition: A | Discharge status: Home / Self Care | Payer: Managed Care, Other (non HMO) | Attending: Emergency Medicine | Admitting: Emergency Medicine

## 2014-11-24 ENCOUNTER — Encounter (HOSPITAL_COMMUNITY): Payer: Self-pay | Admitting: Emergency Medicine

## 2014-11-24 DIAGNOSIS — R21 Rash and other nonspecific skin eruption: Secondary | ICD-10-CM | POA: Diagnosis not present

## 2014-11-24 DIAGNOSIS — Z8619 Personal history of other infectious and parasitic diseases: Secondary | ICD-10-CM | POA: Diagnosis not present

## 2014-11-24 DIAGNOSIS — Z8679 Personal history of other diseases of the circulatory system: Secondary | ICD-10-CM | POA: Diagnosis not present

## 2014-11-24 DIAGNOSIS — M797 Fibromyalgia: Secondary | ICD-10-CM | POA: Diagnosis not present

## 2014-11-24 DIAGNOSIS — Z79899 Other long term (current) drug therapy: Secondary | ICD-10-CM | POA: Insufficient documentation

## 2014-11-24 DIAGNOSIS — Z8719 Personal history of other diseases of the digestive system: Secondary | ICD-10-CM | POA: Insufficient documentation

## 2014-11-24 DIAGNOSIS — L299 Pruritus, unspecified: Secondary | ICD-10-CM | POA: Insufficient documentation

## 2014-11-24 DIAGNOSIS — M069 Rheumatoid arthritis, unspecified: Secondary | ICD-10-CM | POA: Insufficient documentation

## 2014-11-24 DIAGNOSIS — F329 Major depressive disorder, single episode, unspecified: Secondary | ICD-10-CM | POA: Diagnosis not present

## 2014-11-24 MED ORDER — DIPHENHYDRAMINE HCL 25 MG PO TABS: 25.0000 mg | ORAL_TABLET | Freq: Four times a day (QID) | ORAL | Status: DC

## 2014-11-24 MED ORDER — PREDNISONE 10 MG (21) PO TBPK: 10.0000 mg | ORAL_TABLET | Freq: Every day | ORAL | Status: DC

## 2014-11-24 MED ORDER — HYDROCORTISONE 2.5 % EX LOTN: TOPICAL_LOTION | Freq: Two times a day (BID) | CUTANEOUS | Status: DC

## 2014-11-24 NOTE — ED Notes (Signed)
Pt has multiple insect bites all over body x 2 wks.

## 2014-11-24 NOTE — ED Provider Notes (Signed)
CSN: [id number]     Arrival date & time 11/24/14  1300 History  This chart was scribed for non-physician practitioner Catha Gosselin, PA-C working with Samuel Jester, DO by Murriel Hopper, ED Scribe. This patient was seen in room WTR5/WTR5 and the patient's care was started at 1:55 PM.    Chief Complaint  Patient presents with  . Insect Bite     The history is provided by the patient. No language interpreter was used.     HPI Comments: [name] [name] is a 49 y.o. female who presents to the Emergency Department complaining of generalized insect bites over her arms legs and abdomen that have been present for two days. Pt notes that she just moved into a new apartment complex and thinks there may be insects in there that have bitten her. Pt notes that she may have had the bites for longer but just noticed that they began to itch last night and when she woke up this morning she had a rash all over. Pt denies taking or trying any medication for the symptoms. Pt denies fever or chills. Pt reports taking Methotrexate for RA.    Past Medical History  Diagnosis Date  . Rheumatoid arthritis(714.0)   . Depression   . Morbid obesity   . HSV-1 infection   . Diastolic dysfunction   . Fibromyalgia   . Coronary artery calcification seen on CAT scan   . Hiatal hernia   . Esophageal ring    Past Surgical History  Procedure Laterality Date  . Cesarean section    . Tubal ligation    . Laparoscopic gastric sleeve resection  11/21/12    [facility]  . Left heart catheterization with coronary angiogram N/A 08/26/2014    Procedure: LEFT HEART CATHETERIZATION WITH CORONARY ANGIOGRAM;  Surgeon: Runell Gess, MD;  Location: [facility] CATH LAB;  Service: Cardiovascular;  Laterality: N/A;  . Esophageal manometry N/A 10/28/2014    Procedure: ESOPHAGEAL MANOMETRY (EM);  Surgeon: Hilarie Fredrickson, MD;  Location: [facility] ENDOSCOPY;  Service: Endoscopy;  Laterality: N/A;   Family History  Problem Relation Age of  Onset  . Hyperlipidemia Mother   . Sarcoidosis Father   . Lung disease Father     Pleural Mesothelioma  . Cancer Father   . Heart attack Paternal Grandmother   . Hypertension Paternal Grandmother   . Stroke Neg Hx   . Colon cancer Neg Hx   . Esophageal cancer Neg Hx   . Rectal cancer Neg Hx   . Stomach cancer Neg Hx   . Hypertension Maternal Grandmother   . Diabetes Maternal Grandmother    History  Substance Use Topics  . Smoking status: Never Smoker   . Smokeless tobacco: Never Used  . Alcohol Use: No   OB History    No data available     Review of Systems  Constitutional: Negative for fever and chills.  Skin: Positive for color change.      Allergies  Flu virus vaccine and Influenza vaccines  Home Medications   Prior to Admission medications   Medication Sig Start Date End Date Taking? Authorizing Provider  traMADol (ULTRAM) 50 MG tablet Take 50 mg by mouth every 6 (six) hours as needed for moderate pain.  10/03/14  Yes Historical Provider, MD  TROKENDI XR 50 MG CP24 Take 1 tablet by mouth daily as needed (migraines).  09/23/14  Yes Historical Provider, MD  Vitamin D, Ergocalciferol, (DRISDOL) 50000 UNITS CAPS capsule Take 50,000 Units by mouth  every Monday, Wednesday, and Friday. 08/12/14  Yes Historical Provider, MD  VOLTAREN 1 % GEL Apply 2 g topically daily as needed (for pain).  05/02/13  Yes Historical Provider, MD  Abatacept (ORENCIA IV) Inject 1,000 mg into the vein every 28 (twenty-eight) days.     Historical Provider, MD  Acyclovir-Hydrocortisone (XERESE) 5-1 % CREA Apply 1 application topically every 24 hours x 5 doses. 01/03/14 01/04/15  [name], MD  atorvastatin (LIPITOR) 80 MG tablet Take 1 tablet (80 mg total) by mouth daily at 6 PM. 08/26/14   Janetta Hora, PA-C  benzonatate (TESSALON) 200 MG capsule Take 1 capsule (200 mg total) by mouth 3 (three) times daily as needed for cough. 01/03/14 01/04/15  [name], MD  buPROPion (WELLBUTRIN XL)  300 MG 24 hr tablet Take 300 mg by mouth every morning. 09/19/14   Historical Provider, MD  CARAFATE 1 GM/10ML suspension TAKE 10 MLS (1 G TOTAL) BY MOUTH 4 (FOUR) TIMES DAILY - WITH MEALS AND AT BEDTIME. 11/18/14   Amy S Esterwood, PA-C  cetirizine (ZYRTEC) 10 MG tablet Take 1 tablet (10 mg total) by mouth daily. 08/27/13 09/18/14  [name], MD  cyclobenzaprine (FLEXERIL) 10 MG tablet Take 10 mg by mouth 3 (three) times daily as needed for muscle spasms.    Historical Provider, MD  diltiazem (CARTIA XT) 180 MG 24 hr capsule Take 1 capsule (180 mg total) by mouth daily. Patient taking differently: Take 180 mg by mouth at bedtime.  07/26/14   Quintella Reichert, MD  diphenhydrAMINE (BENADRYL) 25 MG tablet Take 1 tablet (25 mg total) by mouth every 6 (six) hours. 11/24/14   Matheo Rathbone Patel-Mills, PA-C  DULoxetine (CYMBALTA) 60 MG capsule Take 60 mg by mouth 3 (three) times daily. 01/03/14   [name], MD  EPINEPHrine 0.3 mg/0.3 mL IJ SOAJ injection Inject 0.3 mLs (0.3 mg total) into the muscle once. Patient not taking: Reported on 11/11/2014 01/03/14 01/04/15  [name], MD  famciclovir [facility]) 500 MG tablet Take three tablets at onset of fever blisters x 1 day 01/03/14   [name], MD  fluticasone (FLONASE) 50 MCG/ACT nasal spray Place 2 sprays into both nostrils as needed for allergies. 01/03/14 01/04/15  [name], MD  gabapentin (NEURONTIN) 100 MG capsule Take 100 mg by mouth 3 (three) times daily.     Historical Provider, MD  HYDROcodone-acetaminophen (NORCO/VICODIN) 5-325 MG per tablet Take 1 tablet by mouth every 6 (six) hours as needed for pain. 10/22/12   Charlestine Night, PA-C  hydrocortisone 2.5 % lotion Apply topically 2 (two) times daily. 11/24/14   Austynn Pridmore Patel-Mills, PA-C  hydrOXYzine (VISTARIL) 25 MG capsule Take 1 capsule BID prn for increased anxiety 01/03/14 01/03/15  [name], MD  hyoscyamine (LEVSIN/SL) 0.125 MG SL tablet Place 1 tablet (0.125 mg total) under the tongue  every 6 (six) hours as needed for cramping. 10/11/14   Amy S Esterwood, PA-C  levothyroxine (SYNTHROID, LEVOTHROID) 25 MCG tablet  09/20/14   Historical Provider, MD  lidocaine (LIDODERM) 5 % Place 1 patch onto the skin as needed (for pain).  06/04/13   Historical Provider, MD  methotrexate 25 MG/ML injection Inject 1 mL into the muscle every Tuesday.  06/21/13   Historical Provider, MD  pantoprazole (PROTONIX) 40 MG tablet TAKE 1 TABLET (40 MG TOTAL) BY MOUTH 2 (TWO) TIMES DAILY. 11/18/14   Amy S Esterwood, PA-C  predniSONE (STERAPRED UNI-PAK 21 TAB) 10 MG (21)  TBPK tablet Take 1 tablet (10 mg total) by mouth daily. Take 6 tabs by mouth daily  for 2 days, then 5 tabs for 2 days, then 4 tabs for 2 days, then 3 tabs for 2 days, 2 tabs for 2 days, then 1 tab by mouth daily for 2 days 11/24/14   Catha Gosselin, PA-C  SILENOR 6 MG TABS  09/25/14   Historical Provider, MD  TL GARD RX 2.2-25-1 MG TABS  09/23/14   Historical Provider, MD   BP 117/83 mmHg  Pulse 100  Temp(Src) 97.7 F (36.5 C) (Oral)  Resp 18  SpO2 98% Physical Exam  Constitutional: She is oriented to person, place, and time. She appears well-developed and well-nourished.  HENT:  Head: Normocephalic and atraumatic.  Cardiovascular: Normal rate.   Pulmonary/Chest: Effort normal.  Abdominal: She exhibits no distension.  Neurological: She is alert and oriented to person, place, and time.  Skin: Skin is warm and dry.  Macular round pruritic raised erythematous areas, with each one being approximately  1cm in diameter, on the bilateral upper and lower extremities, back, and chest.   No weeping or crusting lesions. No track marks or burrows.   Psychiatric: She has a normal mood and affect.  Nursing note and vitals reviewed.   ED Course  Procedures (including critical care time)  DIAGNOSTIC STUDIES: Oxygen Saturation is 98% on room air, normal by my interpretation.    COORDINATION OF CARE: 1:59 PM Discussed treatment plan with pt  at bedside and pt agreed to plan.   Labs Review Labs Reviewed - No data to display  Imaging Review No results found.   EKG Interpretation None      MDM   Final diagnoses:  Rash  Patient states she moved into a new place and woke up with a rash.  She things they may be bed bug bites.  I discussed having an exterminator come in and what to look for on the mattress. I gave her benadryl for itching, prednisone and hydrocortisone 2.5% cream. I explained that she should not use this on the face or genitals. She can follow up outpatient with her pcp and verbally agrees with the plan.  I personally performed the services described in this documentation, which was scribed in my presence. The recorded information has been reviewed and is accurate.    Catha Gosselin, PA-C 11/25/14 1039  Samuel Jester, DO 11/26/14 [phone]

## 2014-11-25 ENCOUNTER — Other Ambulatory Visit (HOSPITAL_COMMUNITY): Payer: Self-pay | Admitting: *Deleted

## 2014-11-26 ENCOUNTER — Encounter (HOSPITAL_COMMUNITY): Payer: 59

## 2014-12-07 ENCOUNTER — Emergency Department (HOSPITAL_BASED_OUTPATIENT_CLINIC_OR_DEPARTMENT_OTHER): Payer: Managed Care, Other (non HMO)

## 2014-12-07 ENCOUNTER — Emergency Department (HOSPITAL_BASED_OUTPATIENT_CLINIC_OR_DEPARTMENT_OTHER)
Admission: EM | Admit: 2014-12-07 | Discharge: 2014-12-07 | Disposition: A | Discharge status: Home / Self Care | Payer: Managed Care, Other (non HMO) | Attending: Emergency Medicine | Admitting: Emergency Medicine

## 2014-12-07 ENCOUNTER — Encounter (HOSPITAL_BASED_OUTPATIENT_CLINIC_OR_DEPARTMENT_OTHER): Payer: Self-pay | Admitting: *Deleted

## 2014-12-07 DIAGNOSIS — M797 Fibromyalgia: Secondary | ICD-10-CM | POA: Diagnosis not present

## 2014-12-07 DIAGNOSIS — Y9389 Activity, other specified: Secondary | ICD-10-CM | POA: Diagnosis not present

## 2014-12-07 DIAGNOSIS — S199XXA Unspecified injury of neck, initial encounter: Secondary | ICD-10-CM | POA: Diagnosis present

## 2014-12-07 DIAGNOSIS — Z79899 Other long term (current) drug therapy: Secondary | ICD-10-CM | POA: Insufficient documentation

## 2014-12-07 DIAGNOSIS — Z7952 Long term (current) use of systemic steroids: Secondary | ICD-10-CM | POA: Insufficient documentation

## 2014-12-07 DIAGNOSIS — Y998 Other external cause status: Secondary | ICD-10-CM | POA: Diagnosis not present

## 2014-12-07 DIAGNOSIS — S299XXA Unspecified injury of thorax, initial encounter: Secondary | ICD-10-CM | POA: Diagnosis not present

## 2014-12-07 DIAGNOSIS — Z8619 Personal history of other infectious and parasitic diseases: Secondary | ICD-10-CM | POA: Insufficient documentation

## 2014-12-07 DIAGNOSIS — F329 Major depressive disorder, single episode, unspecified: Secondary | ICD-10-CM | POA: Diagnosis not present

## 2014-12-07 DIAGNOSIS — Z8719 Personal history of other diseases of the digestive system: Secondary | ICD-10-CM | POA: Diagnosis not present

## 2014-12-07 DIAGNOSIS — I2510 Atherosclerotic heart disease of native coronary artery without angina pectoris: Secondary | ICD-10-CM | POA: Insufficient documentation

## 2014-12-07 DIAGNOSIS — Y92481 Parking lot as the place of occurrence of the external cause: Secondary | ICD-10-CM | POA: Insufficient documentation

## 2014-12-07 DIAGNOSIS — S161XXA Strain of muscle, fascia and tendon at neck level, initial encounter: Secondary | ICD-10-CM | POA: Insufficient documentation

## 2014-12-07 DIAGNOSIS — M546 Pain in thoracic spine: Secondary | ICD-10-CM

## 2014-12-07 MED ORDER — TRAMADOL HCL 50 MG PO TABS: 50.0000 mg | ORAL_TABLET | Freq: Four times a day (QID) | ORAL | Status: DC | PRN
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
## 2014-12-11 ENCOUNTER — Encounter: Payer: Self-pay | Admitting: Cardiology

## 2014-12-12 ENCOUNTER — Encounter (HOSPITAL_COMMUNITY)
Admission: RE | Admit: 2014-12-12 | Discharge: 2014-12-12 | Disposition: A | Discharge status: Home / Self Care | Payer: Managed Care, Other (non HMO) | Source: Ambulatory Visit | Attending: Rheumatology | Admitting: Rheumatology

## 2014-12-12 DIAGNOSIS — M069 Rheumatoid arthritis, unspecified: Secondary | ICD-10-CM | POA: Diagnosis present

## 2014-12-12 LAB — COMPREHENSIVE METABOLIC PANEL
ALT: 16 U/L (ref 14–54)
ANION GAP: 8 (ref 5–15)
AST: 24 U/L (ref 15–41)
Albumin: 3 g/dL — ABNORMAL LOW (ref 3.5–5.0)
Alkaline Phosphatase: 59 U/L (ref 38–126)
BUN: 6 mg/dL (ref 6–20)
CALCIUM: 8.9 mg/dL (ref 8.9–10.3)
CO2: 25 mmol/L (ref 22–32)
Chloride: 109 mmol/L (ref 101–111)
Creatinine, Ser: 0.88 mg/dL (ref 0.44–1.00)
GFR calc non Af Amer: >60 mL/min (ref >60)
GLUCOSE: 94 mg/dL (ref 65–99)
Potassium: 4 mmol/L (ref 3.5–5.1)
SODIUM: 142 mmol/L (ref 135–145)
TOTAL PROTEIN: 6.2 g/dL — AB (ref 6.5–8.1)
Total Bilirubin: 0.8 mg/dL (ref 0.3–1.2)

## 2014-12-12 LAB — CBC WITH DIFFERENTIAL/PLATELET
BASOS ABS: 0 10*3/uL (ref 0.0–0.1)
BASOS PCT: 1 % (ref 0–1)
Eosinophils Absolute: 0.2 10*3/uL (ref 0.0–0.7)
Eosinophils Relative: 3 % (ref 0–5)
HCT: 43.3 % (ref 36.0–46.0)
Hemoglobin: 13.9 g/dL (ref 12.0–15.0)
Lymphocytes Relative: 34 % (ref 12–46)
Lymphs Abs: 2.9 10*3/uL (ref 0.7–4.0)
MCH: 27.3 pg (ref 26.0–34.0)
MCHC: 32.1 g/dL (ref 30.0–36.0)
MCV: 84.9 fL (ref 78.0–100.0)
Monocytes Absolute: 0.5 10*3/uL (ref 0.1–1.0)
Monocytes Relative: 6 % (ref 3–12)
NEUTROS ABS: 4.8 10*3/uL (ref 1.7–7.7)
NEUTROS PCT: 56 % (ref 43–77)
Platelets: 263 10*3/uL (ref 150–400)
RBC: 5.1 MIL/uL (ref 3.87–5.11)
RDW: 13.2 % (ref 11.5–15.5)
WBC: 8.5 10*3/uL (ref 4.0–10.5)

## 2014-12-12 MED ORDER — ACETAMINOPHEN 325 MG PO TABS
ORAL_TABLET | ORAL | Status: AC
  Filled 2014-12-12: qty 2

## 2014-12-12 MED ORDER — SODIUM CHLORIDE 0.9 % IV SOLN
1000.0000 mg | INTRAVENOUS | Status: DC
  Administered 2014-12-12: 1000 mg via INTRAVENOUS
  Filled 2014-12-12: qty 40

## 2014-12-12 MED ORDER — DIPHENHYDRAMINE HCL 25 MG PO CAPS
ORAL_CAPSULE | ORAL | Status: AC
  Filled 2014-12-12: qty 1

## 2014-12-12 MED ORDER — ACETAMINOPHEN 325 MG PO TABS
650.0000 mg | ORAL_TABLET | ORAL | Status: DC
  Administered 2014-12-12: 650 mg via ORAL

## 2014-12-12 MED ORDER — SODIUM CHLORIDE 0.9 % IV SOLN
INTRAVENOUS | Status: DC
Start: 2014-12-12 — End: 2014-12-13
  Administered 2014-12-12: 12:00:00 via INTRAVENOUS

## 2014-12-12 MED ORDER — DIPHENHYDRAMINE HCL 25 MG PO TABS
25.0000 mg | ORAL_TABLET | ORAL | Status: DC
  Administered 2014-12-12: 25 mg via ORAL
  Filled 2014-12-12: qty 1

## 2014-12-21 ENCOUNTER — Emergency Department (HOSPITAL_COMMUNITY): Payer: Managed Care, Other (non HMO)

## 2014-12-21 ENCOUNTER — Emergency Department (HOSPITAL_COMMUNITY)
Admission: EM | Admit: 2014-12-21 | Discharge: 2014-12-22 | Disposition: A | Discharge status: Other Acute Inpatient Hospital | Payer: Managed Care, Other (non HMO) | Attending: Emergency Medicine | Admitting: Emergency Medicine

## 2014-12-21 ENCOUNTER — Encounter (HOSPITAL_COMMUNITY): Payer: Self-pay | Admitting: Emergency Medicine

## 2014-12-21 DIAGNOSIS — R109 Unspecified abdominal pain: Secondary | ICD-10-CM | POA: Insufficient documentation

## 2014-12-21 DIAGNOSIS — R079 Chest pain, unspecified: Secondary | ICD-10-CM | POA: Diagnosis present

## 2014-12-21 DIAGNOSIS — Z8619 Personal history of other infectious and parasitic diseases: Secondary | ICD-10-CM | POA: Diagnosis not present

## 2014-12-21 DIAGNOSIS — Z79899 Other long term (current) drug therapy: Secondary | ICD-10-CM | POA: Insufficient documentation

## 2014-12-21 DIAGNOSIS — R0602 Shortness of breath: Secondary | ICD-10-CM | POA: Diagnosis not present

## 2014-12-21 DIAGNOSIS — M069 Rheumatoid arthritis, unspecified: Secondary | ICD-10-CM | POA: Insufficient documentation

## 2014-12-21 DIAGNOSIS — Z7952 Long term (current) use of systemic steroids: Secondary | ICD-10-CM | POA: Diagnosis not present

## 2014-12-21 DIAGNOSIS — F329 Major depressive disorder, single episode, unspecified: Secondary | ICD-10-CM | POA: Insufficient documentation

## 2014-12-21 DIAGNOSIS — Z8719 Personal history of other diseases of the digestive system: Secondary | ICD-10-CM | POA: Insufficient documentation

## 2014-12-21 DIAGNOSIS — Z791 Long term (current) use of non-steroidal anti-inflammatories (NSAID): Secondary | ICD-10-CM | POA: Insufficient documentation

## 2014-12-21 DIAGNOSIS — R45851 Suicidal ideations: Secondary | ICD-10-CM

## 2014-12-21 DIAGNOSIS — Z8679 Personal history of other diseases of the circulatory system: Secondary | ICD-10-CM | POA: Insufficient documentation

## 2014-12-21 DIAGNOSIS — F419 Anxiety disorder, unspecified: Secondary | ICD-10-CM | POA: Insufficient documentation

## 2014-12-21 DIAGNOSIS — Z9889 Other specified postprocedural states: Secondary | ICD-10-CM | POA: Insufficient documentation

## 2014-12-21 LAB — CBC WITH DIFFERENTIAL/PLATELET
Basophils Absolute: 0 10*3/uL (ref 0.0–0.1)
Basophils Relative: 0 % (ref 0–1)
Eosinophils Absolute: 0.1 10*3/uL (ref 0.0–0.7)
Eosinophils Relative: 1 % (ref 0–5)
HEMATOCRIT: 43.4 % (ref 36.0–46.0)
HEMOGLOBIN: 13.9 g/dL (ref 12.0–15.0)
LYMPHS ABS: 3.6 10*3/uL (ref 0.7–4.0)
Lymphocytes Relative: 40 % (ref 12–46)
MCH: 26.9 pg (ref 26.0–34.0)
MCHC: 32 g/dL (ref 30.0–36.0)
MCV: 84.1 fL (ref 78.0–100.0)
MONO ABS: 0.5 10*3/uL (ref 0.1–1.0)
MONOS PCT: 6 % (ref 3–12)
NEUTROS PCT: 53 % (ref 43–77)
Neutro Abs: 4.7 10*3/uL (ref 1.7–7.7)
Platelets: 262 10*3/uL (ref 150–400)
RBC: 5.16 MIL/uL — AB (ref 3.87–5.11)
RDW: 13 % (ref 11.5–15.5)
WBC: 9 10*3/uL (ref 4.0–10.5)

## 2014-12-21 LAB — URINALYSIS, ROUTINE W REFLEX MICROSCOPIC
Bilirubin Urine: NEGATIVE
GLUCOSE, UA: NEGATIVE mg/dL
Hgb urine dipstick: NEGATIVE
Ketones, ur: NEGATIVE mg/dL
Leukocytes, UA: NEGATIVE
Nitrite: NEGATIVE
PH: 7 (ref 5.0–8.0)
PROTEIN: NEGATIVE mg/dL
Specific Gravity, Urine: 1.025 (ref 1.005–1.030)
Urobilinogen, UA: 0.2 mg/dL (ref 0.0–1.0)

## 2014-12-21 LAB — BASIC METABOLIC PANEL
ANION GAP: 7 (ref 5–15)
BUN: 5 mg/dL — AB (ref 6–20)
CO2: 26 mmol/L (ref 22–32)
Calcium: 9.1 mg/dL (ref 8.9–10.3)
Chloride: 109 mmol/L (ref 101–111)
Creatinine, Ser: 0.84 mg/dL (ref 0.44–1.00)
GFR calc Af Amer: >60 mL/min (ref >60)
GFR calc non Af Amer: >60 mL/min (ref >60)
Glucose, Bld: 91 mg/dL (ref 65–99)
Potassium: 3.2 mmol/L — ABNORMAL LOW (ref 3.5–5.1)
Sodium: 142 mmol/L (ref 135–145)

## 2014-12-21 LAB — RAPID URINE DRUG SCREEN, HOSP PERFORMED
Amphetamines: NOT DETECTED
BARBITURATES: NOT DETECTED
Benzodiazepines: NOT DETECTED
Cocaine: NOT DETECTED
Opiates: NOT DETECTED
TETRAHYDROCANNABINOL: NOT DETECTED

## 2014-12-21 LAB — POC URINE PREG, ED: PREG TEST UR: NEGATIVE

## 2014-12-21 LAB — I-STAT TROPONIN, ED: Troponin i, poc: 0 ng/mL (ref 0.00–0.08)

## 2014-12-21 LAB — ETHANOL: Alcohol, Ethyl (B): <5 mg/dL (ref <5)

## 2014-12-21 MED ORDER — ONDANSETRON HCL 4 MG PO TABS
4.0000 mg | ORAL_TABLET | Freq: Three times a day (TID) | ORAL | Status: DC | PRN
  Administered 2014-12-22: 4 mg via ORAL
  Filled 2014-12-21: qty 1

## 2014-12-21 MED ORDER — LORAZEPAM 1 MG PO TABS
1.0000 mg | ORAL_TABLET | ORAL | Status: DC | PRN
  Administered 2014-12-21: 1 mg via ORAL
  Filled 2014-12-21: qty 1

## 2014-12-21 MED ORDER — NITROGLYCERIN 0.4 MG SL SUBL: 0.4000 mg | SUBLINGUAL_TABLET | SUBLINGUAL | Status: DC | PRN

## 2014-12-21 MED ORDER — DULOXETINE HCL 60 MG PO CPEP
60.0000 mg | ORAL_CAPSULE | Freq: Every day | ORAL | Status: DC
Start: 2014-12-21 — End: 2014-12-22
  Administered 2014-12-21: 60 mg via ORAL
  Filled 2014-12-21 (×2): qty 1

## 2014-12-21 MED ORDER — OXYCODONE-ACETAMINOPHEN 5-325 MG PO TABS
1.0000 | ORAL_TABLET | Freq: Once | ORAL | Status: AC
  Administered 2014-12-21: 1 via ORAL
  Filled 2014-12-21: qty 1

## 2014-12-21 MED ORDER — ATORVASTATIN CALCIUM 80 MG PO TABS
80.0000 mg | ORAL_TABLET | Freq: Every day | ORAL | Status: DC
  Administered 2014-12-21: 80 mg via ORAL
  Filled 2014-12-21 (×2): qty 1

## 2014-12-21 MED ORDER — GABAPENTIN 100 MG PO CAPS
100.0000 mg | ORAL_CAPSULE | Freq: Three times a day (TID) | ORAL | Status: DC
  Administered 2014-12-21 – 2014-12-22 (×3): 100 mg via ORAL
  Filled 2014-12-21 (×5): qty 1

## 2014-12-21 MED ORDER — POTASSIUM CHLORIDE CRYS ER 20 MEQ PO TBCR
40.0000 meq | EXTENDED_RELEASE_TABLET | Freq: Once | ORAL | Status: AC
  Administered 2014-12-21: 40 meq via ORAL
  Filled 2014-12-21: qty 2

## 2014-12-21 MED ORDER — ACETAMINOPHEN 325 MG PO TABS
650.0000 mg | ORAL_TABLET | ORAL | Status: DC | PRN
  Administered 2014-12-21: 650 mg via ORAL
  Filled 2014-12-21: qty 2

## 2014-12-21 MED ORDER — BUPROPION HCL ER (XL) 300 MG PO TB24
300.0000 mg | ORAL_TABLET | Freq: Every day | ORAL | Status: DC
Start: 2014-12-21 — End: 2014-12-22
  Administered 2014-12-21 – 2014-12-22 (×2): 300 mg via ORAL
  Filled 2014-12-21 (×2): qty 1

## 2014-12-21 NOTE — ED Provider Notes (Signed)
CSN: [id number]     Arrival date & time 12/21/14  1214 History   First MD Initiated Contact with Patient 12/21/14 1215     Chief Complaint  Patient presents with  . Chest Pain  . Shortness of Breath  . Suicidal     Patient is a 49 y.o. female presenting with chest pain and shortness of breath. The history is provided by the patient.  Chest Pain Pain location:  Substernal area Pain quality: tightness   Pain radiates to:  L arm Pain severity:  Moderate Onset quality:  Gradual Duration:  3 hours Timing:  Constant Progression:  Unchanged Chronicity:  Recurrent Relieved by:  Nothing Worsened by:  Nothing tried Associated symptoms: abdominal pain, anxiety and shortness of breath   Associated symptoms: no syncope and not vomiting   Shortness of Breath Associated symptoms: abdominal pain and chest pain   Associated symptoms: no syncope and no vomiting   pt reports she had CP that started about 3 hours ago She reports SOB and pain radiating into her left arm She also reports abd pain  She was driving herself to hospital when pain became severe so she pulled over and got a ride from EMS She was given ASA prior to arrival   Past Medical History  Diagnosis Date  . Rheumatoid arthritis(714.0)   . Depression   . Morbid obesity   . HSV-1 infection   . Diastolic dysfunction   . Fibromyalgia   . Coronary artery calcification seen on CAT scan   . Hiatal hernia   . Esophageal ring    Past Surgical History  Procedure Laterality Date  . Cesarean section    . Tubal ligation    . Laparoscopic gastric sleeve resection  11/21/12    [facility]  . Left heart catheterization with coronary angiogram N/A 08/26/2014    Procedure: LEFT HEART CATHETERIZATION WITH CORONARY ANGIOGRAM;  Surgeon: Runell Gess, MD;  Location: [facility] CATH LAB;  Service: Cardiovascular;  Laterality: N/A;  . Esophageal manometry N/A 10/28/2014    Procedure: ESOPHAGEAL MANOMETRY (EM);  Surgeon: Hilarie Fredrickson, MD;   Location: [facility] ENDOSCOPY;  Service: Endoscopy;  Laterality: N/A;   Family History  Problem Relation Age of Onset  . Hyperlipidemia Mother   . Sarcoidosis Father   . Lung disease Father     Pleural Mesothelioma  . Cancer Father   . Heart attack Paternal Grandmother   . Hypertension Paternal Grandmother   . Stroke Neg Hx   . Colon cancer Neg Hx   . Esophageal cancer Neg Hx   . Rectal cancer Neg Hx   . Stomach cancer Neg Hx   . Hypertension Maternal Grandmother   . Diabetes Maternal Grandmother    History  Substance Use Topics  . Smoking status: Never Smoker   . Smokeless tobacco: Never Used  . Alcohol Use: No   OB History    No data available     Review of Systems  Respiratory: Positive for shortness of breath.   Cardiovascular: Positive for chest pain. Negative for syncope.  Gastrointestinal: Positive for abdominal pain. Negative for vomiting.  Psychiatric/Behavioral: The patient is nervous/anxious.   All other systems reviewed and are negative.     Allergies  Flu virus vaccine and Influenza vaccines  Home Medications   Prior to Admission medications   Medication Sig Start Date End Date Taking? Authorizing Provider  Abatacept (ORENCIA IV) Inject 1,000 mg into the vein every 28 (twenty-eight) days.    Yes  Historical Provider, MD  Acyclovir-Hydrocortisone (XERESE) 5-1 % CREA Apply 1 application topically every 24 hours x 5 doses. Patient taking differently: Apply 1 application topically daily as needed (fever blisters from methotrexate).  01/03/14 01/04/15 Yes [name], MD  Adapalene 0.3 % gel Apply 1 application topically daily as needed (acne from methotrexate).  11/30/14  Yes Historical Provider, MD  atorvastatin (LIPITOR) 80 MG tablet Take 1 tablet (80 mg total) by mouth daily at 6 PM. 08/26/14  Yes Janetta Hora, PA-C  benzonatate (TESSALON) 200 MG capsule Take 1 capsule (200 mg total) by mouth 3 (three) times daily as needed for cough. 01/03/14 01/04/15 Yes  [name], MD  Betamethasone Valerate 0.12 % foam Apply 1 application topically daily.  12/09/14  Yes Historical Provider, MD  buPROPion (WELLBUTRIN XL) 300 MG 24 hr tablet Take 300 mg by mouth daily.  09/19/14  Yes Historical Provider, MD  CARAFATE 1 GM/10ML suspension TAKE 10 MLS (1 G TOTAL) BY MOUTH 4 (FOUR) TIMES DAILY - WITH MEALS AND AT BEDTIME. 11/18/14  Yes Amy S Esterwood, PA-C  cetirizine (ZYRTEC) 10 MG tablet Take 10 mg by mouth daily as needed for allergies.   Yes Historical Provider, MD  clindamycin-benzoyl peroxide (BENZACLIN) gel Apply 1 application topically daily as needed (acne from methotrexate).  11/30/14  Yes Historical Provider, MD  cyclobenzaprine (FLEXERIL) 10 MG tablet Take 10 mg by mouth 3 (three) times daily as needed for muscle spasms.   Yes Historical Provider, MD  diclofenac sodium (VOLTAREN) 1 % GEL Apply 1 application topically daily.   Yes Historical Provider, MD  diltiazem (CARTIA XT) 180 MG 24 hr capsule Take 1 capsule (180 mg total) by mouth daily. Patient taking differently: Take 180 mg by mouth at bedtime.  07/26/14  Yes Quintella Reichert, MD  diphenhydrAMINE (BENADRYL) 25 MG tablet Take 1 tablet (25 mg total) by mouth every 6 (six) hours. 11/24/14  Yes Hanna Patel-Mills, PA-C  Doxepin HCl (SILENOR) 6 MG TABS Take 6 mg by mouth at bedtime.   Yes Historical Provider, MD  DULoxetine (CYMBALTA) 30 MG capsule Take 60 mg by mouth at bedtime.  11/30/14  Yes Historical Provider, MD  EPINEPHrine 0.3 mg/0.3 mL IJ SOAJ injection Inject 0.3 mLs (0.3 mg total) into the muscle once. 01/03/14 01/04/15 Yes [name], MD  famciclovir (FAMVIR) 500 MG tablet Take three tablets at onset of fever blisters x 1 day Patient taking differently: Take 1,500 mg by mouth See admin instructions. Take 3 tablets (1500 mg) at onset of fever blisters once 01/03/14  Yes [name], MD  fluticasone (FLONASE) 50 MCG/ACT nasal spray Place 2 sprays into both nostrils as needed for allergies. 01/03/14  01/04/15 Yes [name], MD  Folic Acid-Vit B6-Vit B12 (TL GARD RX) 2.2-25-1 MG TABS Take 1 tablet by mouth daily.   Yes Historical Provider, MD  gabapentin (NEURONTIN) 100 MG capsule Take 100 mg by mouth 3 (three) times daily.    Yes Historical Provider, MD  HYDROcodone-acetaminophen (NORCO/VICODIN) 5-325 MG per tablet Take 1 tablet by mouth every 6 (six) hours as needed for pain. Patient taking differently: Take 1 tablet by mouth every 6 (six) hours as needed (pain).  10/22/12  Yes Christopher Lawyer, PA-C  hydrocortisone 2.5 % lotion Apply topically 2 (two) times daily. Patient taking differently: Apply 1 application topically 2 (two) times daily.  11/24/14  Yes Hanna Patel-Mills, PA-C  hydrOXYzine (VISTARIL) 25 MG capsule Take 1 capsule BID prn for  increased anxiety Patient taking differently: Take 25 mg by mouth 2 (two) times daily as needed for anxiety.  01/03/14 01/03/15 Yes [name], MD  hyoscyamine (LEVSIN/SL) 0.125 MG SL tablet Place 1 tablet (0.125 mg total) under the tongue every 6 (six) hours as needed for cramping. 10/11/14  Yes Amy S Esterwood, PA-C  levothyroxine (SYNTHROID, LEVOTHROID) 25 MCG tablet Take 25 mcg by mouth daily before breakfast.  09/20/14  Yes Historical Provider, MD  lidocaine (LIDODERM) 5 % Place 1 patch onto the skin daily as needed (for pain).  06/04/13  Yes Historical Provider, MD  methotrexate 25 MG/ML injection Inject 25 mg into the muscle every Tuesday.  06/21/13  Yes Historical Provider, MD  pantoprazole (PROTONIX) 40 MG tablet TAKE 1 TABLET (40 MG TOTAL) BY MOUTH 2 (TWO) TIMES DAILY. 11/18/14  Yes Amy S Esterwood, PA-C  Topiramate ER 50 MG CP24 Take 50 mg by mouth daily as needed (migraines). Trokendi XR   Yes Historical Provider, MD  triamcinolone cream (KENALOG) 0.1 % Apply 1 application topically daily.  12/09/14  Yes Historical Provider, MD  Vitamin D, Ergocalciferol, (DRISDOL) 50000 UNITS CAPS capsule Take 50,000 Units by mouth every Monday, Wednesday, and  Friday. 08/12/14  Yes Historical Provider, MD  topiramate (TOPAMAX) 50 MG tablet Take 50 mg by mouth daily as needed (migraines).  12/06/14   Historical Provider, MD  traMADol (ULTRAM) 50 MG tablet Take 1 tablet (50 mg total) by mouth every 6 (six) hours as needed for moderate pain. Patient not taking: Reported on 12/21/2014 12/07/14   Elson Areas, PA-C   BP 172/90 mmHg  Pulse 88  Temp(Src) 98.7 F (37.1 C) (Oral)  Resp 19  SpO2 100% Physical Exam CONSTITUTIONAL: Well developed/well nourished HEAD: Normocephalic/atraumatic EYES: EOMI/PERRL ENMT: Mucous membranes moist NECK: supple no meningeal signs SPINE/BACK:entire spine nontender CV: S1/S2 noted, no murmurs/rubs/gallops noted LUNGS: Lungs are clear to auscultation bilaterally, no apparent distress ABDOMEN: soft, nontender, no rebound or guarding, bowel sounds noted throughout abdomen GU:no cva tenderness NEURO: Pt is awake/alert/appropriate, moves all extremitiesx4.  No facial droop.   EXTREMITIES: pulses normal/equal, full ROM SKIN: warm, color normal PSYCH: anxious, poor eye contact  ED Course  Procedures   1:33 PM Pt with normal cardiac cath earlier this year I doubt ACS/PE/dissection Pt now reports she is suicidal Will consult psych Pt is medically stable at this time   Labs Review Labs Reviewed  BASIC METABOLIC PANEL - Abnormal; Notable for the following:    Potassium 3.2 (*)    BUN 5 (*)    All other components within normal limits  CBC WITH DIFFERENTIAL/PLATELET - Abnormal; Notable for the following:    RBC 5.16 (*)    All other components within normal limits  ETHANOL  URINALYSIS, ROUTINE W REFLEX MICROSCOPIC (NOT AT [facility])  URINE RAPID DRUG SCREEN, HOSP PERFORMED  I-STAT TROPOININ, ED  POC URINE PREG, ED    Imaging Review Dg Chest Portable 1 View  12/21/2014   CLINICAL DATA:  49 year old female with a history of shortness of breath.  EXAM: PORTABLE CHEST - 1 VIEW  COMPARISON:  12/07/2014, 08/22/2014   FINDINGS: The heart size and mediastinal contours are within normal limits. Both lungs are clear. The visualized skeletal structures are unremarkable.  IMPRESSION: Negative for acute cardiopulmonary disease.  Signed,  Yvone Neu. Loreta Ave, DO  [facility]  [facility]   Electronically Signed   By: Gilmer Mor D.O.   On: 12/21/2014 13:04  EKG Interpretation   Date/Time:  Saturday December 21 2014 12:18:03 EDT Ventricular Rate:  98 PR Interval:  123 QRS Duration: 93 QT Interval:  363 QTC Calculation: 463 R Axis:   102 Text Interpretation:  Sinus rhythm Right axis deviation Low voltage,  precordial leads artifact noted Otherwise no significant change Confirmed  by Bebe Shaggy  MD, Dorinda Hill ([id number]) on 12/21/2014 12:41:11 PM      MDM   Final diagnoses:  Chest pain, unspecified chest pain type  Suicidal ideation    Nursing notes including past medical history and social history reviewed and considered in documentation xrays/imaging reviewed by myself and considered during evaluation Labs/vital reviewed myself and considered during evaluation     Zadie Rhine, MD 12/21/14 1504
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
## 2014-12-21 NOTE — ED Notes (Signed)
TTS being performed by Irving Burton, [facility].
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
## 2014-12-21 NOTE — ED Notes (Signed)
Relief sitter from staffing arrived.

## 2014-12-21 NOTE — ED Notes (Signed)
Trained sitter at the bedside.  

## 2014-12-21 NOTE — BH Assessment (Signed)
[facility] Assessment Progress Note Spoke with Dr. Bebe Shaggy, took hx of pt, asked ED staff to put machine in room. Assessment to begin shortly.

## 2014-12-21 NOTE — ED Notes (Addendum)
Sitter for patient. °

## 2014-12-22 ENCOUNTER — Inpatient Hospital Stay (HOSPITAL_COMMUNITY)
Admission: AD | Admit: 2014-12-22 | Discharge: 2014-12-29 | DRG: 885 | Disposition: A | Discharge status: Home / Self Care | Payer: Managed Care, Other (non HMO) | Source: Intra-hospital | Attending: Psychiatry | Admitting: Psychiatry

## 2014-12-22 ENCOUNTER — Encounter (HOSPITAL_COMMUNITY): Payer: Self-pay | Admitting: *Deleted

## 2014-12-22 DIAGNOSIS — J3089 Other allergic rhinitis: Secondary | ICD-10-CM

## 2014-12-22 DIAGNOSIS — R45851 Suicidal ideations: Secondary | ICD-10-CM | POA: Diagnosis present

## 2014-12-22 DIAGNOSIS — F332 Major depressive disorder, recurrent severe without psychotic features: Principal | ICD-10-CM | POA: Diagnosis present

## 2014-12-22 DIAGNOSIS — M797 Fibromyalgia: Secondary | ICD-10-CM | POA: Diagnosis present

## 2014-12-22 DIAGNOSIS — I10 Essential (primary) hypertension: Secondary | ICD-10-CM | POA: Diagnosis present

## 2014-12-22 DIAGNOSIS — Z833 Family history of diabetes mellitus: Secondary | ICD-10-CM | POA: Diagnosis not present

## 2014-12-22 DIAGNOSIS — K219 Gastro-esophageal reflux disease without esophagitis: Secondary | ICD-10-CM | POA: Diagnosis present

## 2014-12-22 DIAGNOSIS — R079 Chest pain, unspecified: Secondary | ICD-10-CM | POA: Diagnosis not present

## 2014-12-22 DIAGNOSIS — G4700 Insomnia, unspecified: Secondary | ICD-10-CM | POA: Diagnosis present

## 2014-12-22 DIAGNOSIS — M069 Rheumatoid arthritis, unspecified: Secondary | ICD-10-CM | POA: Diagnosis present

## 2014-12-22 DIAGNOSIS — Z8249 Family history of ischemic heart disease and other diseases of the circulatory system: Secondary | ICD-10-CM

## 2014-12-22 DIAGNOSIS — F419 Anxiety disorder, unspecified: Secondary | ICD-10-CM | POA: Diagnosis present

## 2014-12-22 MED ORDER — ATORVASTATIN CALCIUM 80 MG PO TABS
80.0000 mg | ORAL_TABLET | Freq: Every day | ORAL | Status: DC
  Administered 2014-12-22 – 2014-12-28 (×7): 80 mg via ORAL
  Filled 2014-12-22 (×4): qty 1
  Filled 2014-12-22: qty 2
  Filled 2014-12-22 (×5): qty 1

## 2014-12-22 MED ORDER — MAGNESIUM HYDROXIDE 400 MG/5ML PO SUSP: 30.0000 mL | Freq: Every day | ORAL | Status: DC | PRN

## 2014-12-22 MED ORDER — ALUM & MAG HYDROXIDE-SIMETH 200-200-20 MG/5ML PO SUSP: 30.0000 mL | ORAL | Status: DC | PRN

## 2014-12-22 MED ORDER — DULOXETINE HCL 60 MG PO CPEP
60.0000 mg | ORAL_CAPSULE | Freq: Every day | ORAL | Status: DC
  Administered 2014-12-22 – 2014-12-23 (×2): 60 mg via ORAL
  Filled 2014-12-22 (×5): qty 1

## 2014-12-22 MED ORDER — DOXEPIN HCL 10 MG PO CAPS
10.0000 mg | ORAL_CAPSULE | Freq: Every day | ORAL | Status: DC
  Administered 2014-12-23 – 2014-12-28 (×6): 10 mg via ORAL
  Filled 2014-12-22 (×6): qty 1
  Filled 2014-12-22: qty 3
  Filled 2014-12-22 (×2): qty 1
  Filled 2014-12-22: qty 3

## 2014-12-22 MED ORDER — ACETAMINOPHEN 325 MG PO TABS
650.0000 mg | ORAL_TABLET | Freq: Four times a day (QID) | ORAL | Status: DC | PRN
  Administered 2014-12-29: 650 mg via ORAL
  Filled 2014-12-22: qty 2

## 2014-12-22 MED ORDER — GABAPENTIN 100 MG PO CAPS
100.0000 mg | ORAL_CAPSULE | Freq: Three times a day (TID) | ORAL | Status: DC
  Administered 2014-12-22 – 2014-12-23 (×3): 100 mg via ORAL
  Filled 2014-12-22 (×10): qty 1

## 2014-12-22 MED ORDER — BUPROPION HCL ER (XL) 300 MG PO TB24
300.0000 mg | ORAL_TABLET | Freq: Every day | ORAL | Status: DC
  Administered 2014-12-23 – 2014-12-29 (×7): 300 mg via ORAL
  Filled 2014-12-22 (×2): qty 1
  Filled 2014-12-22: qty 3
  Filled 2014-12-22 (×5): qty 1
  Filled 2014-12-22: qty 3
  Filled 2014-12-22: qty 1
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
## 2014-12-22 NOTE — ED Notes (Signed)
Consent forms faxed to [facility]. 

## 2014-12-22 NOTE — Progress Notes (Signed)
[facility] Group Notes:  (Nursing/MHT/Case Management/Adjunct)  Date:  12/22/2014  Time:  9:14 PM  Type of Therapy:  Psychoeducational Skills  Participation Level:  Minimal  Participation Quality:  Attentive  Affect:  Depressed  Cognitive:  Lacking  Insight:  None  Engagement in Group:  Limited  Modes of Intervention:  Education  Summary of Progress/Problems: The patient verbalized that her day was "okay" overall, but would not go into further detail. As a theme for the day, her support system will be her family.   Hazle Coca S 12/22/2014, 9:14 PM

## 2014-12-22 NOTE — ED Notes (Signed)
Pt at nurses' desk signing consent forms.

## 2014-12-22 NOTE — Progress Notes (Signed)
Pt accepted to [facility] bed 505-1 per Tanna Savoy, by Dr. Jama Flavors. Minerva Areola spoke with [facility] RN re: pt's placement.  Ilean Skill, MSW, LCSWA Clinical Social Work, Disposition  12/22/2014 [phone]

## 2014-12-22 NOTE — Progress Notes (Signed)
Seeking inpatient psychiatric placement for pt.  Referrals made: [facility]- per Delice Bison, no beds currently but will hold referral for review when beds open [facility]- per Alecia Lemming, same as above [facility]- per Sue Lush, same as above [facility]- per Darrel Hoover- per [facility] voicemail with Turner Daniels and will refer if there is availability. All other facilities contacted ([place], [place], [facility], [address], [facility], [place], [place], [facility], [place]) at capacity and not accepting referrals.  Ilean Skill, MSW, LCSWA Clinical Social Work, Disposition  12/22/2014 [phone]

## 2014-12-22 NOTE — Discharge Instructions (Signed)
Transfer to [facility] 

## 2014-12-22 NOTE — Tx Team (Addendum)
Initial Interdisciplinary Treatment Plan   PATIENT STRESSORS: Financial difficulties Health problems Loss of home Marital or family conflict Traumatic event   PATIENT STRENGTHS: Ability for insight Average or above average intelligence Communication skills General fund of knowledge   PROBLEM LIST: Problem List/Patient Goals Date to be addressed Date deferred Reason deferred Estimated date of resolution  Suicidal ideation  12/22/14     Altered mood/ depressed  12/22/14     "reconciling marriage "                                            DISCHARGE CRITERIA:  Ability to meet basic life and health needs Adequate post-discharge living arrangements Improved stabilization in mood, thinking, and/or behavior Motivation to continue treatment in a less acute level of care Need for constant or close observation no longer present Reduction of life-threatening or endangering symptoms to within safe limits Verbal commitment to aftercare and medication compliance  PRELIMINARY DISCHARGE PLAN: Outpatient therapy  PATIENT/FAMIILY INVOLVEMENT: This treatment plan has been presented to and reviewed with the patient, [name] [name], and/or family member, none.  The patient and family have been given the opportunity to ask questions and make suggestions.  [name] [name] 12/22/2014, 5:57 PM

## 2014-12-22 NOTE — ED Provider Notes (Addendum)
Pt alert, content, nad.  Filed Vitals:   12/22/14 0609  BP: 107/51  Pulse: 86  Temp: 98.1 F (36.7 C)  Resp: 14   Discussed w psych team, reassessment and placement pending.     Cathren Laine, MD 12/22/14 [phone]  Recheck, calm, alert.  Filed Vitals:   12/22/14 1217  BP: 114/88  Pulse: 87  Temp:   Resp: 20   Pt accepted to [facility], Dr Elna Breslow.     Cathren Laine, MD 12/22/14 [phone]

## 2014-12-22 NOTE — ED Notes (Signed)
PT ACCEPTED TO [facility] - 505-2 - DR EAPPEN - MAY BE TRANSPORTED AFTER 1400.

## 2014-12-23 MED ORDER — POTASSIUM CHLORIDE CRYS ER 20 MEQ PO TBCR
20.0000 meq | EXTENDED_RELEASE_TABLET | Freq: Two times a day (BID) | ORAL | Status: AC
  Administered 2014-12-23 – 2014-12-26 (×6): 20 meq via ORAL
  Filled 2014-12-23 (×7): qty 1

## 2014-12-23 MED ORDER — LEVOTHYROXINE SODIUM 25 MCG PO TABS
25.0000 ug | ORAL_TABLET | Freq: Every day | ORAL | Status: DC
  Administered 2014-12-24 – 2014-12-29 (×6): 25 ug via ORAL
  Filled 2014-12-23 (×9): qty 1

## 2014-12-23 MED ORDER — GABAPENTIN 300 MG PO CAPS
300.0000 mg | ORAL_CAPSULE | Freq: Three times a day (TID) | ORAL | Status: DC
  Administered 2014-12-23 – 2014-12-29 (×18): 300 mg via ORAL
  Filled 2014-12-23 (×2): qty 9
  Filled 2014-12-23 (×2): qty 1
  Filled 2014-12-23 (×2): qty 9
  Filled 2014-12-23 (×11): qty 1
  Filled 2014-12-23: qty 9
  Filled 2014-12-23 (×4): qty 1
  Filled 2014-12-23: qty 9
  Filled 2014-12-23: qty 1

## 2014-12-23 MED ORDER — CYCLOBENZAPRINE HCL 10 MG PO TABS
10.0000 mg | ORAL_TABLET | Freq: Three times a day (TID) | ORAL | Status: DC | PRN
  Administered 2014-12-28 – 2014-12-29 (×2): 10 mg via ORAL
  Filled 2014-12-23 (×2): qty 2

## 2014-12-23 MED ORDER — VITAMIN D (ERGOCALCIFEROL) 1.25 MG (50000 UNIT) PO CAPS
50000.0000 [IU] | ORAL_CAPSULE | ORAL | Status: DC
  Administered 2014-12-25 – 2014-12-27 (×2): 50000 [IU] via ORAL
  Filled 2014-12-23 (×3): qty 1

## 2014-12-23 MED ORDER — LIDOCAINE 5 % EX PTCH
1.0000 | MEDICATED_PATCH | Freq: Every day | CUTANEOUS | Status: DC | PRN
  Administered 2014-12-25 – 2014-12-29 (×3): 1 via TRANSDERMAL
  Filled 2014-12-23 (×4): qty 1

## 2014-12-23 MED ORDER — TOPIRAMATE 25 MG PO TABS: 50.0000 mg | ORAL_TABLET | Freq: Every day | ORAL | Status: DC | PRN

## 2014-12-23 MED ORDER — PANTOPRAZOLE SODIUM 40 MG PO TBEC
40.0000 mg | DELAYED_RELEASE_TABLET | Freq: Every day | ORAL | Status: DC
  Administered 2014-12-23 – 2014-12-29 (×7): 40 mg via ORAL
  Filled 2014-12-23 (×10): qty 1

## 2014-12-23 MED ORDER — ARIPIPRAZOLE 2 MG PO TABS
2.0000 mg | ORAL_TABLET | Freq: Every day | ORAL | Status: DC
  Administered 2014-12-23 – 2014-12-29 (×7): 2 mg via ORAL
  Filled 2014-12-23 (×2): qty 1
  Filled 2014-12-23: qty 3
  Filled 2014-12-23: qty 1
  Filled 2014-12-23: qty 3
  Filled 2014-12-23 (×5): qty 1

## 2014-12-23 MED ORDER — B COMPLEX-C PO TABS
1.0000 | ORAL_TABLET | Freq: Every day | ORAL | Status: DC
  Administered 2014-12-24 – 2014-12-29 (×6): 1 via ORAL
  Filled 2014-12-23 (×8): qty 1

## 2014-12-23 MED ORDER — HYDROXYZINE PAMOATE 25 MG PO CAPS
25.0000 mg | ORAL_CAPSULE | Freq: Two times a day (BID) | ORAL | Status: DC | PRN
  Filled 2014-12-23: qty 1

## 2014-12-23 MED ORDER — DILTIAZEM HCL ER COATED BEADS 180 MG PO CP24
180.0000 mg | ORAL_CAPSULE | Freq: Every day | ORAL | Status: DC
  Administered 2014-12-23 – 2014-12-29 (×7): 180 mg via ORAL
  Filled 2014-12-23 (×10): qty 1

## 2014-12-23 NOTE — Progress Notes (Signed)
The focus of this group is to help patients review their daily goal of treatment and discuss progress on daily workbooks. Pt attended the evening group session and responded to all discussion prompts from the Writer. Pt shared that today was an okay day on the unit. Markesia did mention that her goal for the week was to figure out how she was going to deal with her family, whom she cited as stressors to her current situation. Pt was encouraged by the Writer to seek help from members of her Treatment Team in figuring out how to address that stress. Pt reported having no additional requests from Nursing Staff this evening. Pt's affect was appropriate.
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
## 2014-12-23 NOTE — Progress Notes (Signed)
D: Pt denies SI/HI/AVH . Pt concerned that 89 year marriage may be coming to an end. Pt presents very sad and depressed.  . Pt is pleasant and cooperative.  A: Pt was offered support and encouragement. Pt was given scheduled medications. Pt was encourage to attend groups. Q 15 minute checks were done for safety.   R:Pt attends groups and interacts well with peers and staff. Pt is taking medication. Pt has no complaints at this time .Pt receptive to treatment and safety maintained on unit.
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
## 2014-12-23 NOTE — [facility] Group Notes (Signed)
[facility] LCSW Aftercare Discharge Planning Group Note   12/23/2014 5:16 PM  Participation Quality:  Engaged  Mood/Affect:  Depressed  Depression Rating:  9  Anxiety Rating:  8  Thoughts of Suicide:  No Will you contract for safety?   NA  Current AVH:  No  Plan for Discharge/Comments:  "It was my idea to come in.  I had a really bad panic attack, like I thought i was going to die.  I've been having a lot of problems in the relationship with my husband and my kids."  Has been seeing Dr Evelene Croon for 2 years.  Does not have therapist, but is open to referral.  Transportation Means:   Supports:  Daryel Gerald B
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
## 2014-12-23 NOTE — [facility] Group Notes (Signed)
[facility] LCSW Group Therapy  12/23/2014 5:18 PM   Type of Therapy:  Group Therapy  Participation Level: Invited.  Chose to not attend  Summary of Progress/Problems: Today's group focused on relapse prevention.  We defined the term, and then brainstormed on ways to prevent relapse.  Daryel Gerald B 12/23/2014 , 5:18 PM

## 2014-12-23 NOTE — Progress Notes (Signed)
D: Pt denies SI/HI/AV. Pt is pleasant and cooperative. Pt affect is very flat. Pt rates depression at a 8, anxiety at a 7, and Helplessness/hopelessness at a 3.  A: Pt was offered support and encouragement. Pt was given scheduled medications. Pt was encourage to attend groups. Q 15 minute checks were done for safety.  R:Pt attends groups and interacts well with peers and staff. Pt taking medication. Pt has no complaints.Pt receptive to treatment and safety maintained on unit.

## 2014-12-23 NOTE — Plan of Care (Signed)
Problem: Diagnosis: Increased Risk For Suicide Attempt Goal: LTG-Patient Will Report Improvement in Psychotic Symptoms LTG (by discharge) : Patient will report improvement in psychotic symptoms.  Outcome: Progressing Pt denies AVH tonight

## 2014-12-24 DIAGNOSIS — F332 Major depressive disorder, recurrent severe without psychotic features: Principal | ICD-10-CM

## 2014-12-24 MED ORDER — FOLIC ACID 1 MG PO TABS
1.0000 mg | ORAL_TABLET | Freq: Every day | ORAL | Status: DC
  Administered 2014-12-24 – 2014-12-29 (×6): 1 mg via ORAL
  Filled 2014-12-24 (×4): qty 1
  Filled 2014-12-24: qty 3
  Filled 2014-12-24 (×2): qty 1
  Filled 2014-12-24: qty 3
  Filled 2014-12-24 (×2): qty 1

## 2014-12-24 MED ORDER — DULOXETINE HCL 30 MG PO CPEP
30.0000 mg | ORAL_CAPSULE | Freq: Every day | ORAL | Status: DC
  Administered 2014-12-24: 30 mg via ORAL
  Filled 2014-12-24 (×4): qty 1

## 2014-12-24 NOTE — Progress Notes (Signed)
D: Pt is alert and oriented x 4. Pt states that her family do not want to see her and that has made her severely depressed. Pt also c/o some mild anxiety; she states, "The social worker said something about calling my husband tomorrow to see if I can come back home; this is making me a little anxious." Pt also c/o nausea but only wanted some ginger ale for it.  Pt however denies SI, HI, AVH and any form of pain. Pt was cooperative and pleasant. A: Medications administered as prescribed.  Support, encouragement, and safe environment provided.  15-minute safety checks continue. R: Pt was med compliant.  Safety checks continue.

## 2014-12-24 NOTE — Progress Notes (Signed)
The focus of this group is to help patients review their daily goal of treatment and discuss progress on daily workbooks. Pt attended the evening group session and responded to all discussion prompts from the Writer. Pt shared that today was a good day on the unit, the highlight of which was a surprise visit from two friends whom the Pt considers very supportive. Pt's only additional request from Nursing Staff this evening was for liquid soap, which was given to her following group. 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 Pt's affect was appropriate.

## 2014-12-24 NOTE — [facility] Group Notes (Signed)
[facility] LCSW Group Therapy  12/24/2014 1:15 pm  Type of Therapy: Process Group Therapy  Participation Level:  Active  Participation Quality:  Appropriate  Affect:  Flat  Cognitive:  Oriented  Insight:  Improving  Engagement in Group:  Limited  Engagement in Therapy:  Limited  Modes of Intervention:  Activity, Clarification, Education, Problem-solving and Support  Summary of Progress/Problems: Today's group addressed the issue of overcoming obstacles.  Patients were asked to identify their biggest obstacle post d/c that stands in the way of their on-going success, and then problem solve as to how to manage this. Engaged throughout.  A bit more spontaneous and brighter than yesterday.  Talked about bridges that have to be mended, "but it takes 2 to do that, and it only works if the other person is [employer] she tries to use positive self talk, but admitted it is difficult to do.  Treats herself with ginger ale and strawberry ice cream.  Ida Rogue 12/24/2014   3:16 PM

## 2014-12-24 NOTE — Progress Notes (Signed)
D: Pt denies SI/HI/AVH. Pt is pleasant and cooperative. Pt stated she was about the same as when she came in here, but is hopeful that she will start feeling better.   A: Pt was offered support and encouragement. Pt was given scheduled medications. Pt was encourage to attend groups. Q 15 minute checks were done for safety.   R:Pt attends groups and interacts well with peers and staff. Pt is taking medication. Pt has no complaints at this time.Pt receptive to treatment and safety maintained on unit.

## 2014-12-24 NOTE — Progress Notes (Signed)
D: Pt denies SI/HI/AV. Pt is pleasant and cooperative. Pt rates depression at a 7, anxiety at a 3, and Helplessness/hopelessness at a 3.  A: Pt was offered support and encouragement. Pt was given scheduled medications. Pt was encourage to attend groups. Q 15 minute checks were done for safety.  R:Pt attends groups and interacts well with peers and staff. Pt taking medication. Pt has no complaints.Pt receptive to treatment and safety maintained on unit.
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
## 2014-12-24 NOTE — [facility] Group Notes (Signed)
[facility] Group Notes:  (Nursing/MHT/Case Management/Adjunct)  Date:  12/24/2014  Time:  0900  Type of Therapy:  Nurse Education  Participation Level:  Did Not Attend  Summary of Progress/Problems:  Bennye Alm 12/24/2014, 4:31 PM

## 2014-12-24 NOTE — Plan of Care (Signed)
Problem: Alteration in mood Goal: LTG-Patient reports reduction in suicidal thoughts (Patient reports reduction in suicidal thoughts and is able to verbalize a safety plan for whenever patient is feeling suicidal)  Outcome: Progressing Pt denies SI at this time   Problem: Diagnosis: Increased Risk For Suicide Attempt Goal: STG-Patient Will Attend All Groups On The Unit Outcome: Progressing Pt went to group this evening

## 2014-12-25 LAB — BASIC METABOLIC PANEL
Anion gap: 7 (ref 5–15)
BUN: 9 mg/dL (ref 6–20)
CALCIUM: 8.7 mg/dL — AB (ref 8.9–10.3)
CHLORIDE: 104 mmol/L (ref 101–111)
CO2: 29 mmol/L (ref 22–32)
Creatinine, Ser: 0.84 mg/dL (ref 0.44–1.00)
GFR calc Af Amer: >60 mL/min (ref >60)
GFR calc non Af Amer: >60 mL/min (ref >60)
Glucose, Bld: 84 mg/dL (ref 65–99)
Potassium: 3.7 mmol/L (ref 3.5–5.1)
SODIUM: 140 mmol/L (ref 135–145)

## 2014-12-25 MED ORDER — DULOXETINE HCL 20 MG PO CPEP
20.0000 mg | ORAL_CAPSULE | Freq: Every day | ORAL | Status: DC
  Administered 2014-12-25: 20 mg via ORAL
  Filled 2014-12-25 (×4): qty 1

## 2014-12-25 NOTE — Progress Notes (Signed)
Patient ID: [name] [name], female   DOB: December 15, 1965, 49 y.o.   MRN: [id number] [facility] MD Progress Note  12/25/2014 4:42 PM [name] [name]  MRN:  [id number] Subjective:   Patient states she feels partially better, but is still depressed and anxious , and does remain ruminative about family stressors . Denies medication side effects.. Objective : I have reviewed chart , met with patient and have discussed case with staff. She presents with some improvement and states she is less severely depressed, but still presents with a constricted affect. Denies any SI.  No  agitated or disruptive behaviors on unit. Some milieu participation. At patient's request and in her presence I spoke with her husband Gerald Stabs on the phone- they have been separated for a few months. Husband states that patient tends to be " always nit picky, always having to have the last word" and that this has caused marital distancing. Patient states she wants to work on her marriage/preserve marriage, but states she is feeling a little more self assured that she will be all right even if marriage does not survive . Patient very interested in family meeting and we have tentatively scheduled family meeting for Friday at 2,30 PM. At this time does not endorse medication side effects. BMP unremarkable .  Principal Problem: MDD (major depressive disorder), recurrent severe, without psychosis Diagnosis:   Patient Active Problem List   Diagnosis Date Noted  . MDD (major depressive disorder), recurrent severe, without psychosis [F33.2] 12/22/2014  . Essential hypertension [I10] 08/25/2014  . Morbid obesity [E66.01] 08/25/2014  . CAD (coronary artery disease), native coronary artery [I25.10] 08/24/2014  . Numbness and tingling in left arm [R20.0]   . Chest pressure [R07.89] 08/22/2014  . Arm numbness left [R20.8] 08/22/2014  . Chest pain radiating to jaw [R07.9] 08/22/2014  . Chest pain [R07.9] 08/22/2014  . Coronary artery  calcification seen on CAT scan [I25.10] 02/19/2014  . Solitary pulmonary nodule [R91.1] 08/28/2013  . Chest pain, atypical [R07.89] 07/18/2013  . Heart palpitations [R00.2] 06/27/2013  . Cough [R05] 06/04/2013  . Diastolic dysfunction [E42.3] 05/18/2013  . SOB (shortness of breath) [R06.02] 05/09/2013  . Other malaise and fatigue [R53.81, R53.83] 01/21/2013  . Stress and adjustment reaction [F43.29] 12/09/2012  . Right sided abdominal pain [R10.9] 11/29/2012   Total Time spent with patient: 25 minutes    Past Medical History:  Past Medical History  Diagnosis Date  . Rheumatoid arthritis(714.0)   . Depression   . Morbid obesity   . HSV-1 infection   . Diastolic dysfunction   . Fibromyalgia   . Coronary artery calcification seen on CAT scan   . Hiatal hernia   . Esophageal ring     Past Surgical History  Procedure Laterality Date  . Cesarean section    . Tubal ligation    . Laparoscopic gastric sleeve resection  11/21/12    [facility]  . Left heart catheterization with coronary angiogram N/A 08/26/2014    Procedure: LEFT HEART CATHETERIZATION WITH CORONARY ANGIOGRAM;  Surgeon: Lorretta Harp, MD;  Location: [facility] CATH LAB;  Service: Cardiovascular;  Laterality: N/A;  . Esophageal manometry N/A 10/28/2014    Procedure: ESOPHAGEAL MANOMETRY (EM);  Surgeon: Irene Shipper, MD;  Location: [facility] ENDOSCOPY;  Service: Endoscopy;  Laterality: N/A;  . Hernia repair      reports surgery on 3 hernias, with 2 more present   Family History:  Family History  Problem Relation Age of Onset  . Hyperlipidemia Mother   .  Sarcoidosis Father   . Lung disease Father     Pleural Mesothelioma  . Cancer Father   . Heart attack Paternal Grandmother   . Hypertension Paternal Grandmother   . Stroke Neg Hx   . Colon cancer Neg Hx   . Esophageal cancer Neg Hx   . Rectal cancer Neg Hx   . Stomach cancer Neg Hx   . Hypertension Maternal Grandmother   . Diabetes Maternal Grandmother    Social History:   History  Alcohol Use No     History  Drug Use No    History   Social History  . Marital Status: Married    Spouse Name: Harrell Gave   . Number of Children: 2  . Years of Education: 16   Occupational History  . [employer]   Social History Main Topics  . Smoking status: Never Smoker   . Smokeless tobacco: Never Used  . Alcohol Use: No  . Drug Use: No  . Sexual Activity: Not on file   Other Topics Concern  . None   Social History Narrative   Marital Status: Married Aeronautical engineer)    Children:  Son Marcello Moores) Daughter Margreta Journey)    Pets: None    Living Situation: Lives with husband and children    Occupation: Radiation protection practitioner Scientist, clinical (histocompatibility and immunogenetics))     Education: Dietitian (Psychology)     Tobacco Use/Exposure:  None    Alcohol Use:  Occasional   Drug Use:  None   Diet:  Regular   Exercise: Walking or Treadmill (2 x week)   Hobbies: Barrister's clerk, Christmas Decorations.               Additional History:    Sleep:  Improved   Appetite:   Improved    Assessment:   Musculoskeletal: Strength & Muscle Tone: within normal limits Gait & Station: normal Patient leans: N/A   Psychiatric Specialty Exam: Physical Exam  ROS no akathisia or restlessness endorsed,  nausea  But  no vomiting  Endorsed   Blood pressure 107/77, pulse 69, temperature 97.6 F (36.4 C), temperature source Oral, resp. rate 18, height $RemoveBe'5\' 2"'hIBZiUSAs$  (1.575 m), weight 243 lb (110.224 kg).Body mass index is 44.43 kg/(m^2).  General Appearance: Fairly Groomed  Engineer, water::  Good  Speech:  Normal Rate  Volume:  Decreased  Mood:   Still depressed but states feeling somewhat better   Affect:   Still constricted, does smile at times appropriately  Thought Process:  Goal Directed  Orientation:  Other:  fully alert and attentive   Thought Content:  no hallucinations, no delusions , ruminative about family issues   Suicidal Thoughts:  No- at this time denies plan or intention  to hurt self or SI  Homicidal Thoughts:  No  Memory:  recent and remote grossly intact   Judgement:  Fair  Insight:  Fair  Psychomotor Activity:  improving, less isolative   Concentration:  Good  Recall:  Good  Fund of Knowledge:Good  Language: Good  Akathisia:  Negative  Handed:  Right  AIMS (if indicated):     Assets:  Desire for Improvement Resilience  ADL's:  Fair   Cognition: alert and attentive   Sleep:  Number of Hours: 6.5     Current Medications: Current Facility-Administered Medications  Medication Dose Route Frequency Provider Last Rate Last Dose  . acetaminophen (TYLENOL) tablet 650 mg  650 mg Oral Q6H PRN Nicholaus Bloom, MD      . alum &  mag hydroxide-simeth (MAALOX/MYLANTA) 200-200-20 MG/5ML suspension 30 mL  30 mL Oral Q4H PRN Nicholaus Bloom, MD      . ARIPiprazole (ABILIFY) tablet 2 mg  2 mg Oral Daily Encarnacion Slates, NP   2 mg at 12/25/14 0834  . atorvastatin (LIPITOR) tablet 80 mg  80 mg Oral q1800 Nicholaus Bloom, MD   80 mg at 12/24/14 1726  . B-complex with vitamin C tablet 1 tablet  1 tablet Oral Daily Encarnacion Slates, NP   1 tablet at 12/25/14 [phone]  . buPROPion (WELLBUTRIN XL) 24 hr tablet 300 mg  300 mg Oral Daily Nicholaus Bloom, MD   300 mg at 12/25/14 2947  . cyclobenzaprine (FLEXERIL) tablet 10 mg  10 mg Oral TID PRN Encarnacion Slates, NP      . diltiazem (CARDIZEM CD) 24 hr capsule 180 mg  180 mg Oral Daily Encarnacion Slates, NP   180 mg at 12/25/14 6546  . doxepin (SINEQUAN) capsule 10 mg  10 mg Oral QHS Harriet Butte, NP   10 mg at 12/24/14 2121  . DULoxetine (CYMBALTA) DR capsule 30 mg  30 mg Oral QHS Myer Peer Cobos, MD   30 mg at 50/35/46 5681  . folic acid (FOLVITE) tablet 1 mg  1 mg Oral Daily Jenne Campus, MD   1 mg at 12/25/14 2751  . gabapentin (NEURONTIN) capsule 300 mg  300 mg Oral TID Encarnacion Slates, NP   300 mg at 12/25/14 1612  . hydrOXYzine (VISTARIL) capsule 25 mg  25 mg Oral BID PRN Encarnacion Slates, NP      . levothyroxine (SYNTHROID, LEVOTHROID)  tablet 25 mcg  25 mcg Oral QAC breakfast Encarnacion Slates, NP   25 mcg at 12/25/14 7001  . lidocaine (LIDODERM) 5 % 1 patch  1 patch Transdermal Daily PRN Encarnacion Slates, NP   1 patch at 12/25/14 [phone]  . magnesium hydroxide (MILK OF MAGNESIA) suspension 30 mL  30 mL Oral Daily PRN Nicholaus Bloom, MD      . pantoprazole (PROTONIX) EC tablet 40 mg  40 mg Oral Daily Encarnacion Slates, NP   40 mg at 12/25/14 4967  . potassium chloride SA (K-DUR,KLOR-CON) CR tablet 20 mEq  20 mEq Oral BID Encarnacion Slates, NP   20 mEq at 12/25/14 1612  . topiramate (TOPAMAX) tablet 50 mg  50 mg Oral Daily PRN Encarnacion Slates, NP      . Vitamin D (Ergocalciferol) (DRISDOL) capsule 50,000 Units  50,000 Units Oral Q M,W,F Encarnacion Slates, NP   50,000 Units at 12/25/14 1114    Lab Results:  Results for orders placed or performed during the hospital encounter of 12/22/14 (from the past 48 hour(s))  Basic metabolic panel     Status: Abnormal   Collection Time: 12/25/14  6:18 AM  Result Value Ref Range   Sodium 140 135 - 145 mmol/L   Potassium 3.7 3.5 - 5.1 mmol/L   Chloride 104 101 - 111 mmol/L   CO2 29 22 - 32 mmol/L   Glucose, Bld 84 65 - 99 mg/dL   BUN 9 6 - 20 mg/dL   Creatinine, Ser 0.84 0.44 - 1.00 mg/dL   Calcium 8.7 (L) 8.9 - 10.3 mg/dL   GFR calc non Af Amer >60 >60 mL/min   GFR calc Af Amer >60 >60 mL/min    Comment: (NOTE) The eGFR has been calculated using the CKD EPI equation.  This calculation has not been validated in all clinical situations. eGFR's persistently <60 mL/min signify possible Chronic Kidney Disease.    Anion gap 7 5 - 15    Comment: Performed at [facility]    Physical Findings: AIMS: Facial and Oral Movements Muscles of Facial Expression: None, normal Lips and Perioral Area: None, normal Jaw: None, normal Tongue: None, normal,Extremity Movements Upper (arms, wrists, hands, fingers): None, normal Lower (legs, knees, ankles, toes): None, normal, Trunk Movements Neck,  shoulders, hips: None, normal, Overall Severity Severity of abnormal movements (highest score from questions above): None, normal Incapacitation due to abnormal movements: None, normal Patient's awareness of abnormal movements (rate only patient's report): No Awareness, Dental Status Current problems with teeth and/or dentures?: No Does patient usually wear dentures?: No  CIWA:    COWS:      Assessment- she remains depressed , sad but reports subjective ,partial improvement and does present with some increased affective reactivity. She remains focused on her marital stress and wanting to preserve marriage. Husband is agreeing to come in for family session on Friday. This seemed to reassure patient. She is tolerating medications well. No SI at present.    Treatment Plan Summary: Daily contact with patient to assess and evaluate symptoms and progress in treatment, Medication management, Plan inpatient treatment and medications as below Abilify 2 mgrs QDAY as augmentation strategy for depression Wellbutrin XL 300 mgrs QAM for depression Decrease Cymbalta to 20 mgrs QDAY - consider titrating Wellbutrin and tapering down Cymbalta to minimize potential drug drug interactions, side effects  Neurontin 300 mgrs TID for pain  And for anxiety Protonix for GERD symptoms  Vitamin supplementation Family meeting set up for Friday , to review family support, family stressors.  Medical Decision Making:  Established Problem, Stable/Improving (1), Review of Psycho-Social Stressors (1), Review or order clinical lab tests (1) and Review of New Medication or Change in Dosage (2)     COBOS, FERNANDO 12/25/2014, 4:42 PM

## 2014-12-25 NOTE — [facility] Group Notes (Signed)
[facility] Group Therapy  12/25/2014 , 1:32 PM    Type of Therapy:  [facility] [employer] Presentation  Participation Level:  Active  Participation Quality:  Attentive  Affect:  Blunted  Cognitive:  Oriented  Insight:  Limited  Engagement in Therapy:  Engaged  Modes of Intervention:  Discussion, Education and Socialization  Summary of Progress/Problems:  Onalee Hua from [facility] came to present his recovery story and play the guitar.   Stayed the entire time.  Sat quietly and was engaged with the speaker.  Thanked him for coming.  Daryel Gerald B 12/25/2014 , 1:32 PM

## 2014-12-25 NOTE — Progress Notes (Signed)
D- Patient is sad and depressed with a blunt affect.  Patient is observed in the milieu with minimal interaction.  She has c/o of her "Rheumatoid Arthritis acting up".  Denies SI, HI, and AVH. Patient was given medication to assist with pain management. See MAR.  Patient reports on her self inventory sheet, her depression a "6", feelings of hopelessness "5", and anxiety "8" with 10 being the worst.  Her goal for today is to "manage emotions" and she plan s to achieve that goal by "positive thinking". A- Scheduled medications administered to patient, per MD orders. Support and encouragement provided.  Patient was encouraged to go down to the gym with her peers to get off the unit for a few minutes.  Patient was reluctant at first but then decided to go down with her peers.  Routine safety checks conducted every 15 minutes.  Patient informed to notify staff with problems or concerns. R- No adverse drug reactions noted. Patient contracts for safety at this time. Patient compliant with medications and treatment plan. Patient receptive, calm, and cooperative.  Patient remains safe at this time.

## 2014-12-26 NOTE — Progress Notes (Signed)
Patient ID: [name] [name], female   DOB: 22-Feb-1966, 49 y.o.   MRN: [id number] [facility] MD Progress Note  12/26/2014 1:47 PM [name] [name]  MRN:  [id number] Subjective:    She reports she is feeling somewhat better- she states she has been able to read her bible and find some comfort in this. She states she feels less upset about marital conflict and states that she realizes that even though she wants her marriage to succeed , she know " life will go on even if it does not work out". She reports mild nausea, currently improving, which she feels may be related to Abilify trial, but she wants to continue taking this medication as she feels it is helping . Marland Kitchen Objective : I have reviewed chart , met with patient and have discussed case with staff. Partial improvement compared to admission- still depressed, but clearly to a lesser degree and more optimistic that she will be OK even if marriage does not work out . Less ruminative about marital discord, but is looking forward and somewhat anxious about family meeting with husband, which is scheduled for tomorrow . Denies medication side effects other than mild nausea- no vomiting-. Does feel medications are working . No disruptive behaviors on unit, and has been going to groups and is more visible in milieu.   Principal Problem: MDD (major depressive disorder), recurrent severe, without psychosis Diagnosis:   Patient Active Problem List   Diagnosis Date Noted  . MDD (major depressive disorder), recurrent severe, without psychosis [F33.2] 12/22/2014  . Essential hypertension [I10] 08/25/2014  . Morbid obesity [E66.01] 08/25/2014  . CAD (coronary artery disease), native coronary artery [I25.10] 08/24/2014  . Numbness and tingling in left arm [R20.0]   . Chest pressure [R07.89] 08/22/2014  . Arm numbness left [R20.8] 08/22/2014  . Chest pain radiating to jaw [R07.9] 08/22/2014  . Chest pain [R07.9] 08/22/2014  . Coronary artery calcification  seen on CAT scan [I25.10] 02/19/2014  . Solitary pulmonary nodule [R91.1] 08/28/2013  . Chest pain, atypical [R07.89] 07/18/2013  . Heart palpitations [R00.2] 06/27/2013  . Cough [R05] 06/04/2013  . Diastolic dysfunction [F02.6] 05/18/2013  . SOB (shortness of breath) [R06.02] 05/09/2013  . Other malaise and fatigue [R53.81, R53.83] 01/21/2013  . Stress and adjustment reaction [F43.29] 12/09/2012  . Right sided abdominal pain [R10.9] 11/29/2012   Total Time spent with patient: 20 minutes   Past Medical History:  Past Medical History  Diagnosis Date  . Rheumatoid arthritis(714.0)   . Depression   . Morbid obesity   . HSV-1 infection   . Diastolic dysfunction   . Fibromyalgia   . Coronary artery calcification seen on CAT scan   . Hiatal hernia   . Esophageal ring     Past Surgical History  Procedure Laterality Date  . Cesarean section    . Tubal ligation    . Laparoscopic gastric sleeve resection  11/21/12    [facility]  . Left heart catheterization with coronary angiogram N/A 08/26/2014    Procedure: LEFT HEART CATHETERIZATION WITH CORONARY ANGIOGRAM;  Surgeon: Lorretta Harp, MD;  Location: [facility] CATH LAB;  Service: Cardiovascular;  Laterality: N/A;  . Esophageal manometry N/A 10/28/2014    Procedure: ESOPHAGEAL MANOMETRY (EM);  Surgeon: Irene Shipper, MD;  Location: [facility] ENDOSCOPY;  Service: Endoscopy;  Laterality: N/A;  . Hernia repair      reports surgery on 3 hernias, with 2 more present   Family History:  Family History  Problem Relation Age  of Onset  . Hyperlipidemia Mother   . Sarcoidosis Father   . Lung disease Father     Pleural Mesothelioma  . Cancer Father   . Heart attack Paternal Grandmother   . Hypertension Paternal Grandmother   . Stroke Neg Hx   . Colon cancer Neg Hx   . Esophageal cancer Neg Hx   . Rectal cancer Neg Hx   . Stomach cancer Neg Hx   . Hypertension Maternal Grandmother   . Diabetes Maternal Grandmother    Social History:  History   Alcohol Use No     History  Drug Use No    History   Social History  . Marital Status: Married    Spouse Name: Harrell Gave   . Number of Children: 2  . Years of Education: 16   Occupational History  . [place]   Social History Main Topics  . Smoking status: Never Smoker   . Smokeless tobacco: Never Used  . Alcohol Use: No  . Drug Use: No  . Sexual Activity: Not on file   Other Topics Concern  . None   Social History Narrative   Marital Status: Married Aeronautical engineer)    Children:  Son Marcello Moores) Daughter Margreta Journey)    Pets: None    Living Situation: Lives with husband and children    Occupation: Radiation protection practitioner Scientist, clinical (histocompatibility and immunogenetics))     Education: Dietitian (Psychology)     Tobacco Use/Exposure:  None    Alcohol Use:  Occasional   Drug Use:  None   Diet:  Regular   Exercise: Walking or Treadmill (2 x week)   Hobbies: Barrister's clerk, [employer] Decorations.               Additional History:    Sleep:  Improved   Appetite:   Improved    Assessment:   Musculoskeletal: Strength & Muscle Tone: within normal limits Gait & Station: normal Patient leans: N/A   Psychiatric Specialty Exam: Physical Exam  ROS no akathisia or restlessness endorsed,  Mild  nausea  But  no vomiting  Endorsed   Blood pressure 114/71, pulse 78, temperature 97.9 F (36.6 C), temperature source Oral, resp. rate 18, height $RemoveBe'5\' 2"'RFiqCbvki$  (1.575 m), weight 243 lb (110.224 kg).Body mass index is 44.43 kg/(m^2).  General Appearance: improved grooming   Eye Contact::  Good  Speech:  Normal Rate  Volume:  Decreased  Mood:    Less depressed   Affect:   Still constricted,  But improved compared to admission  Thought Process:  Goal Directed  Orientation:  Other:  fully alert and attentive   Thought Content:  no hallucinations, no delusions , ruminative about family issues   Suicidal Thoughts:  No- at this time denies plan or intention to hurt self or SI  Homicidal  Thoughts:  No  Memory:  recent and remote grossly intact   Judgement:  Other:  improving   Insight:  improving  Psychomotor Activity:  Normal  Concentration:  Good  Recall:  Good  Fund of Knowledge:Good  Language: Good  Akathisia:  Negative  Handed:  Right  AIMS (if indicated):     Assets:  Desire for Improvement Resilience  ADL's:  Fair   Cognition: alert and attentive   Sleep:  Number of Hours: 7     Current Medications: Current Facility-Administered Medications  Medication Dose Route Frequency Provider Last Rate Last Dose  . acetaminophen (TYLENOL) tablet 650 mg  650 mg Oral Q6H PRN Nicholaus Bloom,  MD      . alum & mag hydroxide-simeth (MAALOX/MYLANTA) 200-200-20 MG/5ML suspension 30 mL  30 mL Oral Q4H PRN Nicholaus Bloom, MD      . ARIPiprazole (ABILIFY) tablet 2 mg  2 mg Oral Daily Encarnacion Slates, NP   2 mg at 12/26/14 0809  . atorvastatin (LIPITOR) tablet 80 mg  80 mg Oral q1800 Nicholaus Bloom, MD   80 mg at 12/25/14 1812  . B-complex with vitamin C tablet 1 tablet  1 tablet Oral Daily Encarnacion Slates, NP   1 tablet at 12/26/14 0809  . buPROPion (WELLBUTRIN XL) 24 hr tablet 300 mg  300 mg Oral Daily Nicholaus Bloom, MD   300 mg at 12/26/14 6606  . cyclobenzaprine (FLEXERIL) tablet 10 mg  10 mg Oral TID PRN Encarnacion Slates, NP      . diltiazem (CARDIZEM CD) 24 hr capsule 180 mg  180 mg Oral Daily Encarnacion Slates, NP   180 mg at 12/26/14 0809  . doxepin (SINEQUAN) capsule 10 mg  10 mg Oral QHS Harriet Butte, NP   10 mg at 12/25/14 2122  . DULoxetine (CYMBALTA) DR capsule 20 mg  20 mg Oral QHS Jenne Campus, MD   20 mg at 12/25/14 2122  . folic acid (FOLVITE) tablet 1 mg  1 mg Oral Daily Jenne Campus, MD   1 mg at 12/26/14 3016  . gabapentin (NEURONTIN) capsule 300 mg  300 mg Oral TID Encarnacion Slates, NP   300 mg at 12/26/14 1210  . hydrOXYzine (VISTARIL) capsule 25 mg  25 mg Oral BID PRN Encarnacion Slates, NP      . levothyroxine (SYNTHROID, LEVOTHROID) tablet 25 mcg  25 mcg Oral QAC  breakfast Encarnacion Slates, NP   25 mcg at 12/26/14 0109  . lidocaine (LIDODERM) 5 % 1 patch  1 patch Transdermal Daily PRN Encarnacion Slates, NP   1 patch at 12/25/14 [phone]  . magnesium hydroxide (MILK OF MAGNESIA) suspension 30 mL  30 mL Oral Daily PRN Nicholaus Bloom, MD      . pantoprazole (PROTONIX) EC tablet 40 mg  40 mg Oral Daily Encarnacion Slates, NP   40 mg at 12/26/14 0811  . topiramate (TOPAMAX) tablet 50 mg  50 mg Oral Daily PRN Encarnacion Slates, NP      . Vitamin D (Ergocalciferol) (DRISDOL) capsule 50,000 Units  50,000 Units Oral Q M,W,F Encarnacion Slates, NP   50,000 Units at 12/25/14 1114    Lab Results:  Results for orders placed or performed during the hospital encounter of 12/22/14 (from the past 48 hour(s))  Basic metabolic panel     Status: Abnormal   Collection Time: 12/25/14  6:18 AM  Result Value Ref Range   Sodium 140 135 - 145 mmol/L   Potassium 3.7 3.5 - 5.1 mmol/L   Chloride 104 101 - 111 mmol/L   CO2 29 22 - 32 mmol/L   Glucose, Bld 84 65 - 99 mg/dL   BUN 9 6 - 20 mg/dL   Creatinine, Ser 0.84 0.44 - 1.00 mg/dL   Calcium 8.7 (L) 8.9 - 10.3 mg/dL   GFR calc non Af Amer >60 >60 mL/min   GFR calc Af Amer >60 >60 mL/min    Comment: (NOTE) The eGFR has been calculated using the CKD EPI equation. This calculation has not been validated in all clinical situations. eGFR's persistently <60 mL/min signify possible Chronic  Kidney Disease.    Anion gap 7 5 - 15    Comment: Performed at [facility]    Physical Findings: AIMS: Facial and Oral Movements Muscles of Facial Expression: None, normal Lips and Perioral Area: None, normal Jaw: None, normal Tongue: None, normal,Extremity Movements Upper (arms, wrists, hands, fingers): None, normal Lower (legs, knees, ankles, toes): None, normal, Trunk Movements Neck, shoulders, hips: None, normal, Overall Severity Severity of abnormal movements (highest score from questions above): None, normal Incapacitation due to  abnormal movements: None, normal Patient's awareness of abnormal movements (rate only patient's report): No Awareness, Dental Status Current problems with teeth and/or dentures?: No Does patient usually wear dentures?: No  CIWA:    COWS:      Assessment-  Depression gradually improving. At this time still presenting with constricted affect and some sadness but improved compared to admission and more confident in self . She is tolerating medications well and nausea she had described related to Abilify trial is subsiding and not associated with anorexia or vomiting. She is looking forward to family meeting with husband tomorrow .  Treatment Plan Summary: Daily contact with patient to assess and evaluate symptoms and progress in treatment, Medication management, Plan inpatient treatment and medications as below Abilify 2 mgrs QDAY as augmentation strategy for depression Wellbutrin XL 300 mgrs QAM for depression D/C Cymbalta- rationale is that she has responded to Wellbutrin trial and we are trying to simplify medication regimen and limit risk of side effects or drug - drug interactions. Neurontin 300 mgrs TID for pain  And for anxiety Protonix for GERD symptoms  Vitamin supplementation Family meeting  Tomorrow  , to review family support, family stressors.  Medical Decision Making:  Established Problem, Stable/Improving (1), Review of Psycho-Social Stressors (1), Review or order clinical lab tests (1) and Review of New Medication or Change in Dosage (2)     COBOS, FERNANDO 12/26/2014, 1:47 PM

## 2014-12-26 NOTE — Progress Notes (Signed)
D: Pt denies SI/HI/AVH. Pt is pleasant and cooperative. Pt stated she went to all her groups and she was feeling a little better.  A: Pt was offered support and encouragement. Pt was given scheduled medications. Pt was encourage to attend groups. Q 15 minute checks were done for safety.   R:Pt attends groups and interacts well with peers and staff. Pt is taking medication. Pt has no complaints at this time.Pt receptive to treatment and safety maintained on unit.

## 2014-12-26 NOTE — [facility] Group Notes (Signed)
[facility] Group Notes:  (Counselor/Nursing/MHT/Case Management/Adjunct)  12/26/2014 1:15PM  Type of Therapy:  Group Therapy  Participation Level:  Active  Participation Quality:  Appropriate  Affect:  Flat  Cognitive:  Oriented  Insight:  Improving  Engagement in Group:  Limited  Engagement in Therapy:  Limited  Modes of Intervention:  Discussion, Exploration and Socialization  Summary of Progress/Problems: The topic for group was balance in life.  Pt participated in the discussion about when their life was in balance and out of balance and how this feels.  Pt discussed ways to get back in balance and short term goals they can work on to get where they want to be.  No spontaneous contributions, but was willing to comment when asked specifically.  Talked about her struggles since being asked to leave the family home, but also admitted that she had been struggling previous to that with anxiety and depression,  and "maybe I wasn't putting all my effort into trying to do something about that."  Enjoys being part of this community because it is "safe and caring."  Gave an example of how she had shown kindness to another patient.   [name] [name] 12/26/2014 3:48 PM

## 2014-12-26 NOTE — Plan of Care (Signed)
Problem: Diagnosis: Increased Risk For Suicide Attempt Goal: STG-Patient Will Attend All Groups On The Unit Outcome: Progressing Pt stated she went to all her groups today

## 2014-12-26 NOTE — Progress Notes (Signed)
D- Patient is pleasant. She is depressed, sad, and soft spoken.  Brightens on approach.  Currently denies SI, HI, AVH, and pain.  Patient made progress this shift by going down to the gym with her peers instead of staying on the unit.  On patient's self inventory sheet, she reports low energy and poor concentration.  Patient requested a bible to read on the unit and one was provided.  She rates her depression and feelings of hopelessness a "5" and her anxiety an "8" with "10" being the worst.  Her goal is to continue to work on "positive thinking" by making "positive statements". A- Scheduled medications administered to patient, per MD orders. Support and encouragement provided.  Patient requested staff to print out "God's yellow pages".  Patient received a copy of her request and expressed gratitude.  Routine safety checks conducted every 15 minutes.  Patient informed to notify staff with problems or concerns. R- No adverse drug reactions noted. Patient contracts for safety at this time. Patient compliant with medications and treatment plan. Patient receptive, calm, and cooperative.  Patient remains safe at this time.

## 2014-12-27 MED ORDER — DIPHENHYDRAMINE HCL 25 MG PO CAPS
25.0000 mg | ORAL_CAPSULE | Freq: Every evening | ORAL | Status: DC | PRN
  Filled 2014-12-27: qty 3

## 2014-12-27 MED ORDER — HYDROXYZINE HCL 25 MG PO TABS
25.0000 mg | ORAL_TABLET | Freq: Two times a day (BID) | ORAL | Status: DC | PRN
  Administered 2014-12-27 – 2014-12-28 (×2): 25 mg via ORAL
  Filled 2014-12-27 (×2): qty 1

## 2014-12-27 NOTE — [facility] Group Notes (Signed)
[facility] LCSW Aftercare Discharge Planning Group Note   12/27/2014 11:41 AM  Participation Quality:  Engaged  Mood/Affect:  Flat  Depression Rating:  2  Anxiety Rating:  5  Thoughts of Suicide:  No Will you contract for safety?   NA  Current AVH:  No  Plan for Discharge/Comments:  States she is anxious due to family session today.  Says husband split with her due to "my nitpicking and nagging."  She is hopeful that by coming to the hospital for help, and reconnecting with therapist "to help me not get as dug in about little things." Says her back up plan if her husband says not yet is "three different friends and relatives."  "I can deal with however it works outTenneco Inc: family or friends  Supports: family, friends, relatives  [place], [place] B

## 2014-12-27 NOTE — Tx Team (Signed)
Interdisciplinary Treatment Plan Update (Adult)  Date:  12/27/2014   Time Reviewed:  11:45 AM   Progress in Treatment: Attending groups: Yes. Participating in groups:  Yes. Taking medication as prescribed:  Yes. Tolerating medication:  Yes. Family/Significant othe contact made:  Yes Patient understands diagnosis:  Yes   Discussing patient identified problems/goals with staff:  Yes, see initial care plan. Medical problems stabilized or resolved:  Yes. Denies suicidal/homicidal ideation: Yes. Issues/concerns per patient self-inventory:  No. Other:  New problem(s) identified:  Discharge Plan or Barriers: Plan A:return home with family  Plan B: stay with friend or relative  Reason for Continuation of Hospitalization:   Comments:  Estimated length of stay: Likely d/c tomorrow  New goal(s):  Review of initial/current patient goals per problem list:     Attendees: Patient:  12/27/2014 11:45 AM   Family:   12/27/2014 11:45 AM   Physician:  Dr Tawni Carnes, MD 12/27/2014 11:45 AM   Nursing:   Rodman Key, RN 12/27/2014 11:45 AM   CSW:    Daryel Gerald, LCSW   12/27/2014 11:45 AM   Other:  12/27/2014 11:45 AM   Other:   12/27/2014 11:45 AM   Other:  Onnie Boer, Nurse CM 12/27/2014 11:45 AM   Other:  Leisa Lenz, [facility] 12/27/2014 11:45 AM   Other:  Tomasita Morrow, P4CC  12/27/2014 11:45 AM   Other:  12/27/2014 11:45 AM   Other:  12/27/2014 11:45 AM   Other:  12/27/2014 11:45 AM   Other:  12/27/2014 11:45 AM   Other:  12/27/2014 11:45 AM   Other:   12/27/2014 11:45 AM    Scribe for Treatment Team:   Ida Rogue, 12/27/2014 11:45 AM

## 2014-12-27 NOTE — Progress Notes (Signed)
Patient ID: [name] [name], female   DOB: [birth year], 49 y.o.   MRN: [id number] [facility] MD Progress Note  12/27/2014 3:43 PM [name] [name]  MRN:  [id number] Subjective:    She reports she is feeling somewhat better- she states she has been able to read her bible and find some comfort in this. Her thoughts have not been racing, and she has not cried today. She is looking forward to a new start. She states "I need to work on me, and I can't save the world". Her husband could not come for a family session today, because his truck broke down (2 hours away). Abilify has helped improve her mood.  She states she feels less upset about marital conflict and states that she realizes that even though she wants her marriage to succeed , she know " life will go on even if it does not work out". She reports mild nausea, currently improving, which she feels may be related to Abilify trial, but she wants to continue taking this medication as she feels it is helping. Ginger ale and saltine crackers help decrease nausea. Objective : I have reviewed chart , met with patient and have discussed case with staff. Partial improvement compared to admission- still depressed, but clearly to a lesser degree and more optimistic that she will be OK even if marriage does not work out . Less ruminative about marital discord. Will try to arrange family meeting with husband and social worker tomorrow, but if husband will not take her back home, pt is able and willing to live with friends or relatives. Denies medication side effects other than mild nausea- no vomiting-. Does feel medications are working . No disruptive behaviors on unit, and has been going to groups and is more visible in milieu.   Principal Problem: MDD (major depressive disorder), recurrent severe, without psychosis Diagnosis:   Patient Active Problem List   Diagnosis Date Noted  . MDD (major depressive disorder), recurrent severe, without psychosis [F33.2]  12/22/2014  . Essential hypertension [I10] 08/25/2014  . Morbid obesity [E66.01] 08/25/2014  . CAD (coronary artery disease), native coronary artery [I25.10] 08/24/2014  . Numbness and tingling in left arm [R20.0]   . Chest pressure [R07.89] 08/22/2014  . Arm numbness left [R20.8] 08/22/2014  . Chest pain radiating to jaw [R07.9] 08/22/2014  . Chest pain [R07.9] 08/22/2014  . Coronary artery calcification seen on CAT scan [I25.10] 02/19/2014  . Solitary pulmonary nodule [R91.1] 08/28/2013  . Chest pain, atypical [R07.89] 07/18/2013  . Heart palpitations [R00.2] 06/27/2013  . Cough [R05] 06/04/2013  . Diastolic dysfunction [U02.3] 05/18/2013  . SOB (shortness of breath) [R06.02] 05/09/2013  . Other malaise and fatigue [R53.81, R53.83] 01/21/2013  . Stress and adjustment reaction [F43.29] 12/09/2012  . Right sided abdominal pain [R10.9] 11/29/2012   Total Time spent with patient: 20 minutes   Past Medical History:  Past Medical History  Diagnosis Date  . Rheumatoid arthritis(714.0)   . Depression   . Morbid obesity   . HSV-1 infection   . Diastolic dysfunction   . Fibromyalgia   . Coronary artery calcification seen on CAT scan   . Hiatal hernia   . Esophageal ring     Past Surgical History  Procedure Laterality Date  . Cesarean section    . Tubal ligation    . Laparoscopic gastric sleeve resection  11/21/12    [facility]  . Left heart catheterization with coronary angiogram N/A 08/26/2014    Procedure: LEFT HEART  CATHETERIZATION WITH CORONARY ANGIOGRAM;  Surgeon: Lorretta Harp, MD;  Location: [facility] CATH LAB;  Service: Cardiovascular;  Laterality: N/A;  . Esophageal manometry N/A 10/28/2014    Procedure: ESOPHAGEAL MANOMETRY (EM);  Surgeon: Irene Shipper, MD;  Location: [facility] ENDOSCOPY;  Service: Endoscopy;  Laterality: N/A;  . Hernia repair      reports surgery on 3 hernias, with 2 more present   Family History:  Family History  Problem Relation Age of Onset  .  Hyperlipidemia Mother   . Sarcoidosis Father   . Lung disease Father     Pleural Mesothelioma  . Cancer Father   . Heart attack Paternal Grandmother   . Hypertension Paternal Grandmother   . Stroke Neg Hx   . Colon cancer Neg Hx   . Esophageal cancer Neg Hx   . Rectal cancer Neg Hx   . Stomach cancer Neg Hx   . Hypertension Maternal Grandmother   . Diabetes Maternal Grandmother    Social History:  History  Alcohol Use No     History  Drug Use No    History   Social History  . Marital Status: Married    Spouse Name: Harrell Gave   . Number of Children: 2  . Years of Education: 16   Occupational History  . [employer]   Social History Main Topics  . Smoking status: Never Smoker   . Smokeless tobacco: Never Used  . Alcohol Use: No  . Drug Use: No  . Sexual Activity: Not on file   Other Topics Concern  . None   Social History Narrative   Marital Status: Married Aeronautical engineer)    Children:  Son Marcello Moores) Daughter Margreta Journey)    Pets: None    Living Situation: Lives with husband and children    Occupation: Radiation protection practitioner Scientist, clinical (histocompatibility and immunogenetics))     Education: Dietitian (Psychology)     Tobacco Use/Exposure:  None    Alcohol Use:  Occasional   Drug Use:  None   Diet:  Regular   Exercise: Walking or Treadmill (2 x week)   Hobbies: Barrister's clerk, Christmas Decorations.               Additional History:    Sleep:  Improved   Appetite:   Improved    Assessment:   Musculoskeletal: Strength & Muscle Tone: within normal limits Gait & Station: normal Patient leans: N/A   Psychiatric Specialty Exam: Physical Exam  ROS no akathisia or restlessness endorsed,  Mild  nausea  But  no vomiting  Endorsed   Blood pressure 102/83, pulse 75, temperature 97.6 F (36.4 C), temperature source Oral, resp. rate 16, height $RemoveBe'5\' 2"'[id number]$  (1.575 m), weight 110.224 kg (243 lb).Body mass index is 44.43 kg/(m^2).  General Appearance: improved grooming    Eye Contact::  Good  Speech:  Normal Rate  Volume:  Decreased  Mood:    "good"  Affect:   Still constricted,  But improved compared to admission, reactive and smiling at times  Thought Process:  Goal Directed  Orientation:  Other:  fully alert and attentive   Thought Content:  no hallucinations, no delusions , ruminative about family issues   Suicidal Thoughts:  No- at this time denies plan or intention to hurt self or SI  Homicidal Thoughts:  No  Memory:  recent and remote grossly intact   Judgement:  Other:  improving   Insight:  improving  Psychomotor Activity:  Normal  Concentration:  Good  Recall:  Good  Fund of Knowledge:Good  Language: Good  Akathisia:  Negative  Handed:  Right  AIMS (if indicated):     Assets:  Desire for Improvement Resilience  ADL's:  Fair   Cognition: alert and attentive   Sleep:  Number of Hours: 6.75     Current Medications: Current Facility-Administered Medications  Medication Dose Route Frequency Provider Last Rate Last Dose  . acetaminophen (TYLENOL) tablet 650 mg  650 mg Oral Q6H PRN Nicholaus Bloom, MD      . alum & mag hydroxide-simeth (MAALOX/MYLANTA) 200-200-20 MG/5ML suspension 30 mL  30 mL Oral Q4H PRN Nicholaus Bloom, MD      . ARIPiprazole (ABILIFY) tablet 2 mg  2 mg Oral Daily Encarnacion Slates, NP   2 mg at 12/27/14 0759  . atorvastatin (LIPITOR) tablet 80 mg  80 mg Oral q1800 Nicholaus Bloom, MD   80 mg at 12/26/14 1719  . B-complex with vitamin C tablet 1 tablet  1 tablet Oral Daily Encarnacion Slates, NP   1 tablet at 12/27/14 0759  . buPROPion (WELLBUTRIN XL) 24 hr tablet 300 mg  300 mg Oral Daily Nicholaus Bloom, MD   300 mg at 12/27/14 0759  . cyclobenzaprine (FLEXERIL) tablet 10 mg  10 mg Oral TID PRN Encarnacion Slates, NP      . diltiazem (CARDIZEM CD) 24 hr capsule 180 mg  180 mg Oral Daily Encarnacion Slates, NP   180 mg at 12/27/14 0758  . doxepin (SINEQUAN) capsule 10 mg  10 mg Oral QHS Harriet Butte, NP   10 mg at 81/82/99 3716  . folic acid  (FOLVITE) tablet 1 mg  1 mg Oral Daily Jenne Campus, MD   1 mg at 12/27/14 0759  . gabapentin (NEURONTIN) capsule 300 mg  300 mg Oral TID Encarnacion Slates, NP   300 mg at 12/27/14 1206  . hydrOXYzine (VISTARIL) capsule 25 mg  25 mg Oral BID PRN Encarnacion Slates, NP      . levothyroxine (SYNTHROID, LEVOTHROID) tablet 25 mcg  25 mcg Oral QAC breakfast Encarnacion Slates, NP   25 mcg at 12/27/14 0610  . lidocaine (LIDODERM) 5 % 1 patch  1 patch Transdermal Daily PRN Encarnacion Slates, NP   1 patch at 12/26/14 1441  . magnesium hydroxide (MILK OF MAGNESIA) suspension 30 mL  30 mL Oral Daily PRN Nicholaus Bloom, MD      . pantoprazole (PROTONIX) EC tablet 40 mg  40 mg Oral Daily Encarnacion Slates, NP   40 mg at 12/27/14 0758  . topiramate (TOPAMAX) tablet 50 mg  50 mg Oral Daily PRN Encarnacion Slates, NP      . Vitamin D (Ergocalciferol) (DRISDOL) capsule 50,000 Units  50,000 Units Oral Q M,W,F Encarnacion Slates, NP   50,000 Units at 12/27/14 9678    Lab Results:  No results found for this or any previous visit (from the past 48 hour(s)).  Physical Findings: AIMS: Facial and Oral Movements Muscles of Facial Expression: None, normal Lips and Perioral Area: None, normal Jaw: None, normal Tongue: None, normal,Extremity Movements Upper (arms, wrists, hands, fingers): None, normal Lower (legs, knees, ankles, toes): None, normal, Trunk Movements Neck, shoulders, hips: None, normal, Overall Severity Severity of abnormal movements (highest score from questions above): None, normal Incapacitation due to abnormal movements: None, normal Patient's awareness of abnormal movements (rate only patient's report): No Awareness, Dental Status Current problems with teeth and/or dentures?:  No Does patient usually wear dentures?: No  CIWA:    COWS:      Assessment-  Depression gradually improving. At this time still presenting with constricted affect and some sadness but improved compared to admission and more confident in self . She  is tolerating medications well and nausea she had described related to Abilify trial is subsiding and not associated with anorexia or vomiting. She is looking forward to family meeting with husband, if possible, tomorrow .  Treatment Plan Summary: Daily contact with patient to assess and evaluate symptoms and progress in treatment, Medication management, Plan inpatient treatment and medications as below Abilify 2 mgrs QDAY as augmentation strategy for depression Wellbutrin XL 300 mgrs QAM for depression D/C Cymbalta- rationale is that she has responded to Wellbutrin trial and we are trying to simplify medication regimen and limit risk of side effects or drug - drug interactions. Neurontin 300 mgrs TID for pain  And for anxiety Protonix for GERD symptoms  Vitamin supplementation Will restart zyrtec 10 mg QHS for allergic rhinitis. Family meeting  Tomorrow  If possible, to review family support, family stressors.  If husband not willing to take pt home tomorrow, pt may be discharged to friend or family home, if mood continues to improve and stabilize.  Medical Decision Making:  Established Problem, Stable/Improving (1), Review of Psycho-Social Stressors (1), Review or order clinical lab tests (1) and Review of New Medication or Change in Dosage (2)     [name] [name] 12/27/2014, 3:43 PM

## 2014-12-27 NOTE — Progress Notes (Addendum)
  [facility] Adult Case Management Discharge Plan :  Will you be returning to the same living situation after discharge:  Pt unsure where she will go, but has  A plan A,B and C - DISCHARGE WAS DELAYED TO 7/24 WHEN HUSBAND WOULD NOT TAKE PT, BUT SHE HAS ARRANGED TO LIVE IN BOTTOM HALF OF A FRIEND'S HOME, SHARE KITCHEN.  UPBEAT AND GOAL-DIRECTED. Ambrose Mantle, LCSW 12/29/2014, 2:05 PM   At discharge, do you have transportation home?: Yes,  friends or family Do you have the ability to pay for your medications: Yes,  insurance  Release of information consent forms completed and in the chart;  Patient's signature needed at discharge.  Patient to Follow up at: Follow-up Information    Follow up with [facility] On 01/02/2015.   Why:  Appointment scheduled on this date at 12:45 pm for medication management with Dr. Greta Doom information:   [address] [place], [place] [address] Phone: [phone] Fax: [phone]      Follow up with Integrative therapy On 12/31/2014.   Why:  tuesday at 5:00 with [facility] information:   [address] [place]  [phone]      Patient denies SI/HI: Yes,  yes    Safety Planning and Suicide Prevention discussed: Yes,  yes  Have you used any form of tobacco in the last 30 days? (Cigarettes, Smokeless Tobacco, Cigars, and/or Pipes): No  Has patient been referred to the Quitline?: N/A patient is not a smoker  [place] B 12/27/2014, 11:51 AM

## 2014-12-27 NOTE — Progress Notes (Signed)
D: Pt is alert and oriented x 4. Pt will be have having a family session with husband tomorrow which is making her a little anxious; she states, "the family session is tomorrow; I feel a little anxious about what might happen but I am at peace." Pt also verbalizes depression. Pt states she is still not happy about the way she was treated by her family; "I never expected it to come to this." Pt however verbalize hope from tomorrow's meeting. Pt complained of mild generalized pain of 4 on a 0 -10 scale level. Pt however denies SI, HI and AVH. Pt is continue to be pleasant and cooperative. A: Medications administered as prescribed.  Support, encouragement, and safe environment provided.  15-minute safety checks continue. R: Pt was med compliant.  Safety checks continue.

## 2014-12-27 NOTE — [facility] Group Notes (Signed)
[facility] LCSW Group Therapy     Type of Therapy:  Group Therapy  Participation Level:  Active  Participation Quality:  Attentive  Affect:  Appropriate  Cognitive:  Appropriate  Insight:  Improving  Engagement in Therapy:  Engaged  Modes of Intervention:  Discussion and Support  Summary of Progress/Problems:  Finding Balance in Life. Today's group focused on defining balance in one's own words, identifying things that can knock one off balance, and exploring healthy ways to maintain balance in life. Group members were asked to provide an example of a time when they felt off balance, describe how they handled that situation, and process healthier ways to regain balance in the future. Group members were asked to share the most important tool for maintaining balance that they learned while at [facility] and how they plan to apply this method after discharge.  States her live is balanced, states she has "stopped worrying about other people" and focused on self.  Described honesty, transparency and prayer as her coping strategies to regain balance in life.   [name] [name] 12/27/2014 3:27 PM

## 2014-12-27 NOTE — Progress Notes (Signed)
D Loran has been quiet all day. She was extremely nervous earlier today...about her family session with her husband. She says it was cancelled due to her husband not being able to be here. She denies SI today. She does say she still has anxiety.   A She takes her scheduled meds as ordered and  She attends her groups.    R Safety is in place and poc cont.

## 2014-12-27 NOTE — [facility] Suicide Risk Assessment (Signed)
[facility] INPATIENT:  Family/Significant Other Suicide Prevention Education  Suicide Prevention Education:  Education Completed; Talley Casco, [phone]  has been identified by the patient as the family member/significant other with whom the patient will be residing, and identified as the person(s) who will aid the patient in the event of a mental health crisis (suicidal ideations/suicide attempt).  With written consent from the patient, the family member/significant other has been provided the following suicide prevention education, prior to the and/or following the discharge of the patient.  The suicide prevention education provided includes the following:  Suicide risk factors  Suicide prevention and interventions  National Suicide Hotline telephone number  [facility] assessment telephone number  [facility] Emergency Assistance 911  [facility] and/or Residential Mobile Crisis Unit telephone number  Request made of family/significant other to:  Remove weapons (e.g., guns, rifles, knives), all items previously/currently identified as safety concern.    Remove drugs/medications (over-the-counter, prescriptions, illicit drugs), all items previously/currently identified as a safety concern.  The family member/significant other verbalizes understanding of the suicide prevention education information provided.  The family member/significant other agrees to remove the items of safety concern listed above.  Daryel Gerald B 12/27/2014, 4:10 PM

## 2014-12-27 NOTE — Progress Notes (Signed)
The patient attended last evening's Karaoke group and was appropriate.  

## 2014-12-28 MED ORDER — DIPHENHYDRAMINE HCL 25 MG PO CAPS: 25.0000 mg | ORAL_CAPSULE | Freq: Every evening | ORAL | Status: DC | PRN

## 2014-12-28 MED ORDER — ATORVASTATIN CALCIUM 80 MG PO TABS: 80.0000 mg | ORAL_TABLET | Freq: Every day | ORAL | Status: DC

## 2014-12-28 MED ORDER — DILTIAZEM HCL ER COATED BEADS 180 MG PO CP24: 180.0000 mg | ORAL_CAPSULE | Freq: Every day | ORAL | Status: DC

## 2014-12-28 MED ORDER — ARIPIPRAZOLE 2 MG PO TABS: 2.0000 mg | ORAL_TABLET | Freq: Every day | ORAL | Status: DC

## 2014-12-28 MED ORDER — BUPROPION HCL ER (XL) 300 MG PO TB24: 300.0000 mg | ORAL_TABLET | Freq: Every day | ORAL | Status: DC

## 2014-12-28 MED ORDER — LEVOTHYROXINE SODIUM 25 MCG PO TABS: 25.0000 ug | ORAL_TABLET | Freq: Every day | ORAL | Status: DC

## 2014-12-28 MED ORDER — DOXEPIN HCL 10 MG PO CAPS: 10.0000 mg | ORAL_CAPSULE | Freq: Every day | ORAL | Status: DC

## 2014-12-28 MED ORDER — FOLIC ACID 1 MG PO TABS: 1.0000 mg | ORAL_TABLET | Freq: Every day | ORAL | Status: DC

## 2014-12-28 MED ORDER — TOPIRAMATE ER 50 MG PO CAP24: 50.0000 mg | ORAL_CAPSULE | Freq: Every day | ORAL | Status: DC | PRN

## 2014-12-28 MED ORDER — GABAPENTIN 300 MG PO CAPS
300.0000 mg | ORAL_CAPSULE | Freq: Three times a day (TID) | ORAL | Status: DC
Start: 2014-12-28 — End: 2015-04-22

## 2014-12-28 MED ORDER — FLUTICASONE PROPIONATE 50 MCG/ACT NA SUSP: 2.0000 | NASAL | Status: DC | PRN

## 2014-12-28 NOTE — Progress Notes (Signed)
D Jeanette is upset this morning at shift change...saying things like " I don't think Im ready to go home yet.... I  think my husband planned to NOT come to the meeting yesterday". She  Begins to cry  And says " I cont know what Im going to do... I( don't think I can go home today". This Clinical research associate processed with pt  Different things she could do to ground herself if she felt like family session ( scheduled for after lunch today, with her husband)  Begins to take a [place] direction and she can repeat these positive facts to herself  , ie " just because our relationship  Is in trouble does NOT mean I am a bad person. " " just because my husband is not accepting of my behavior and / or my illness, does NOT mean I am a failure or that my life ".   A This afternoon, when she and her husband had their family session ( and her husband told that she cannot come back home) she became very upset  ( per Child psychotherapist) , discharge was cancelled and now pt will begin to work on NEW discharge plan. This AM, pt completed her daily assessment and on it she wrote she   Denied SI and she rated her depression, hopelessness and anxiety " 9/8/9", respectively.    R safety is in place and poc cont.

## 2014-12-28 NOTE — [facility] Group Notes (Signed)
[facility] Group Notes:  (Clinical Social Work)  12/28/2014  11:15-12:00PM  Summary of Progress/Problems:   The main focus of today's process group was to discuss patients' feelings related to being hospitalized, as well as the difference between "being" and "having" a mental health diagnosis.  It was agreed in general by the group that it would be preferable to avoid future hospitalizations, and we discussed means of doing that.  As a follow-up, problems with adhering to medication recommendations were discussed.  The patient expressed their primary feeling about being hospitalized is good, and she feels she has gotten necessary help.  She said for fun she hopes to go to the beach soon.    Type of Therapy:  Group Therapy - Process  Participation Level:  Active  Participation Quality:  Attentive  Affect:  Blunted  Cognitive:  Alert  Insight:  Developing/Improving  Engagement in Therapy:  Developing/Improving  Modes of Intervention:  Exploration, Discussion  Ambrose Mantle, LCSW 12/28/2014, 1:35 PM

## 2014-12-28 NOTE — Progress Notes (Signed)
Adult Psychoeducational Group Note  Date:  12/28/2014 Time:  9:29 PM  Group Topic/Focus:  Wrap-Up Group:   The focus of this group is to help patients review their daily goal of treatment and discuss progress on daily workbooks.  Participation Level:  Active  Participation Quality:  Appropriate  Affect:  Appropriate  Cognitive:  Appropriate  Insight: Appropriate  Engagement in Group:  Engaged  Modes of Intervention:  Discussion  Additional Comments: The patient expressed that she attended group today     Octavio Manns 12/28/2014, 9:29 PM

## 2014-12-28 NOTE — [facility] Suicide Risk Assessment (Deleted)
[facility] Discharge Suicide Risk Assessment   Demographic Factors:  married, employed, living with her family  Total Time spent with patient: 30 minutes  Musculoskeletal: Strength & Muscle Tone: within normal limits Gait & Station: normal Patient leans: N/A  Psychiatric Specialty Exam: Physical Exam  Psychiatric: She has a normal mood and affect. Her speech is normal and behavior is normal. Judgment and thought content normal. Cognition and memory are normal.    Review of Systems  Constitutional: Negative.   HENT: Negative.   Eyes: Negative.   Respiratory: Negative.   Cardiovascular: Negative.   Gastrointestinal: Negative.   Genitourinary: Negative.   Musculoskeletal: Negative.   Skin: Negative.   Neurological: Negative.   Endo/Heme/Allergies: Negative.   Psychiatric/Behavioral: Negative.     Blood pressure 111/81, pulse 92, temperature 97.9 F (36.6 C), temperature source Oral, resp. rate 16, height 5\' 2"  (1.575 m), weight 110.224 kg (243 lb).Body mass index is 44.43 kg/(m^2).  General Appearance: Well Groomed  ::  Good  Speech:  Clear and Coherent  Volume:  Normal  Mood:  Euthymic  Affect:  Appropriate  Thought Process:  Goal Directed and Logical  Orientation:  Full (Time, Place, and Person)  Thought Content:  Negative  Suicidal Thoughts:  No  Homicidal Thoughts:  No  Memory:  Immediate;   Good Recent;   Good Remote;   Good  Judgement:  Fair  Insight:  Fair  Psychomotor Activity:  Normal  Concentration:  Good  Recall:  Good  Fund of Knowledge:Good  Language: Good  Akathisia:  No  Handed:  Right  AIMS (if indicated):     Assets:  Communication Skills Desire for Improvement Financial Resources/Insurance Housing Physical Health Social Support  Sleep:  Number of Hours: 6  Cognition: WNL  ADL's:  Intact   Have you used any form of tobacco in the last 30 days? (Cigarettes, Smokeless Tobacco, Cigars, and/or Pipes): No  Has this patient used any form  of tobacco in the last 30 days? (Cigarettes, Smokeless Tobacco, Cigars, and/or Pipes) No  Mental Status Per Nursing Assessment::   On Admission:   (pt states she is "homeless", was kicked out of her house)  Current Mental Status by Physician: patient denies suicidal ideation, intent or plan  Loss Factors: NA  Historical Factors: NA  Risk Reduction Factors:   Sense of responsibility to family, Living with another person, especially a relative, Positive social support, Positive therapeutic relationship and Positive coping skills or problem solving skills  Continued Clinical Symptoms:  Resolving depression  Cognitive Features That Contribute To Risk:  None    Suicide Risk:  Minimal: No identifiable suicidal ideation.  Patients presenting with no risk factors but with morbid ruminations; may be classified as minimal risk based on the severity of the depressive symptoms  Principal Problem: MDD (major depressive disorder), recurrent severe, without psychosis Discharge Diagnoses:  Patient Active Problem List   Diagnosis Date Noted  . MDD (major depressive disorder) [F32.2] 12/28/2014  . MDD (major depressive disorder), recurrent severe, without psychosis [F33.2] 12/22/2014  . Essential hypertension [I10] 08/25/2014  . Morbid obesity [E66.01] 08/25/2014  . CAD (coronary artery disease), native coronary artery [I25.10] 08/24/2014  . Numbness and tingling in left arm [R20.0]   . Chest pressure [R07.89] 08/22/2014  . Arm numbness left [R20.8] 08/22/2014  . Chest pain radiating to jaw [R07.9] 08/22/2014  . Chest pain [R07.9] 08/22/2014  . Coronary artery calcification seen on CAT scan [I25.10] 02/19/2014  . Solitary pulmonary nodule [R91.1] 08/28/2013  .  Chest pain, atypical [R07.89] 07/18/2013  . Heart palpitations [R00.2] 06/27/2013  . Cough [R05] 06/04/2013  . Diastolic dysfunction [I51.9] 05/18/2013  . SOB (shortness of breath) [R06.02] 05/09/2013  . Other malaise and fatigue  [R53.81, R53.83] 01/21/2013  . Stress and adjustment reaction [F43.29] 12/09/2012  . Right sided abdominal pain [R10.9] 11/29/2012    Follow-up Information    Follow up with [facility] On 01/02/2015.   Why:  Appointment scheduled on this date at 12:45 pm for medication management with Dr. Greta Doom information:   [address] [place], [place] [address] Phone: [phone] Fax: [phone]      Follow up with Integrative therapy On 12/31/2014.   Why:  tuesday at 5:00 with [facility] information:   [address] [place]  [facility]  [phone]      Plan Of Care/Follow-up recommendations:  Activity:  as tolerated Diet:  healthy Tests:  routine  Is patient on multiple antipsychotic therapies at discharge:  No   Has Patient had three or more failed trials of antipsychotic monotherapy by history:  No  Recommended Plan for Multiple Antipsychotic Therapies: NA    Keirstyn Aydt,MD 12/29/2014, 11:02 AM

## 2014-12-28 NOTE — Progress Notes (Signed)
Patient ID: [name] [name], female   DOB: 07/13/1965, 49 y.o.   MRN: [id number] [facility] MD Progress Note  12/28/2014 2:40 PM [name] [name]  MRN:  [id number] Subjective: " I cannot go home today because I had an argument with my husband and he refused to take me home.''  Objective : Patient seen, interviewed, chart reviewed and case reviewed with the nursing staff. She reports being disappointment due to her husband refusal to participate in the family meeting arranged by the social worker. However, she denies suicidal or homicidal thoughts, intent or plan, delusional thinking or psychosis. Patient verbalizes being apprehensive, anxious but reports decreased depressive symptoms. Patient is cooperative with her treatment and unit milieu, she denies adverse reactions to her medications.  Principal Problem: MDD (major depressive disorder), recurrent severe, without psychosis Diagnosis:   Patient Active Problem List   Diagnosis Date Noted  . MDD (major depressive disorder) [F32.2] 12/28/2014  . MDD (major depressive disorder), recurrent severe, without psychosis [F33.2] 12/22/2014  . Essential hypertension [I10] 08/25/2014  . Morbid obesity [E66.01] 08/25/2014  . CAD (coronary artery disease), native coronary artery [I25.10] 08/24/2014  . Numbness and tingling in left arm [R20.0]   . Chest pressure [R07.89] 08/22/2014  . Arm numbness left [R20.8] 08/22/2014  . Chest pain radiating to jaw [R07.9] 08/22/2014  . Chest pain [R07.9] 08/22/2014  . Coronary artery calcification seen on CAT scan [I25.10] 02/19/2014  . Solitary pulmonary nodule [R91.1] 08/28/2013  . Chest pain, atypical [R07.89] 07/18/2013  . Heart palpitations [R00.2] 06/27/2013  . Cough [R05] 06/04/2013  . Diastolic dysfunction [I51.9] 05/18/2013  . SOB (shortness of breath) [R06.02] 05/09/2013  . Other malaise and fatigue [R53.81, R53.83] 01/21/2013  . Stress and adjustment reaction [F43.29] 12/09/2012  . Right sided  abdominal pain [R10.9] 11/29/2012   Total Time spent with patient: 20 minutes   Past Medical History:  Past Medical History  Diagnosis Date  . Rheumatoid arthritis(714.0)   . Depression   . Morbid obesity   . HSV-1 infection   . Diastolic dysfunction   . Fibromyalgia   . Coronary artery calcification seen on CAT scan   . Hiatal hernia   . Esophageal ring     Past Surgical History  Procedure Laterality Date  . Cesarean section    . Tubal ligation    . Laparoscopic gastric sleeve resection  11/21/12    [facility]  . Left heart catheterization with coronary angiogram N/A 08/26/2014    Procedure: LEFT HEART CATHETERIZATION WITH CORONARY ANGIOGRAM;  Surgeon: Runell Gess, MD;  Location: [facility] CATH LAB;  Service: Cardiovascular;  Laterality: N/A;  . Esophageal manometry N/A 10/28/2014    Procedure: ESOPHAGEAL MANOMETRY (EM);  Surgeon: Hilarie Fredrickson, MD;  Location: [facility] ENDOSCOPY;  Service: Endoscopy;  Laterality: N/A;  . Hernia repair      reports surgery on 3 hernias, with 2 more present   Family History:  Family History  Problem Relation Age of Onset  . Hyperlipidemia Mother   . Sarcoidosis Father   . Lung disease Father     Pleural Mesothelioma  . Cancer Father   . Heart attack Paternal Grandmother   . Hypertension Paternal Grandmother   . Stroke Neg Hx   . Colon cancer Neg Hx   . Esophageal cancer Neg Hx   . Rectal cancer Neg Hx   . Stomach cancer Neg Hx   . Hypertension Maternal Grandmother   . Diabetes Maternal Grandmother    Social History:  History  Alcohol Use No     History  Drug Use No    History   Social History  . Marital Status: Married    Spouse Name: [name]   . Number of Children: 2  . Years of Education: 16   Occupational History  . CUSTOMER SERVICE [employer]   Social History Main Topics  . Smoking status: Never Smoker   . Smokeless tobacco: Never Used  . Alcohol Use: No  . Drug Use: No  . Sexual Activity: Not on file    Other Topics Concern  . None   Social History Narrative   Marital Status: Married Probation officer)    Children:  Son [name] [name]) Daughter [name] [name])    Pets: None    Living Situation: Lives with husband and children    Occupation: Occupational psychologist Administrator)     Education: Oncologist (Psychology)     Tobacco Use/Exposure:  None    Alcohol Use:  Occasional   Drug Use:  None   Diet:  Regular   Exercise: Walking or Treadmill (2 x week)   Hobbies: Clinical cytogeneticist, [employer].               Additional History:    Sleep:  Improved   Appetite:   Improved    Assessment:   Musculoskeletal: Strength & Muscle Tone: within normal limits Gait & Station: normal Patient leans: N/A   Psychiatric Specialty Exam: Physical Exam  ROS no akathisia or restlessness endorsed,  Mild  nausea  But  no vomiting  Endorsed   Blood pressure 111/81, pulse 92, temperature 97.9 F (36.6 C), temperature source Oral, resp. rate 16, height 5\' 2"  (1.575 m), weight 110.224 kg (243 lb).Body mass index is 44.43 kg/(m^2).  General Appearance: improved grooming   Eye Contact::  Good  Speech:  Normal Rate  Volume:  Decreased  Mood:    "good"  Affect:   Still constricted,  But improved compared to admission, reactive and smiling at times  Thought Process:  Goal Directed  Orientation:  Other:  fully alert and attentive   Thought Content:  no hallucinations, no delusions , ruminative about family issues   Suicidal Thoughts:  No- at this time denies plan or intention to hurt self or SI  Homicidal Thoughts:  No  Memory:  recent and remote grossly intact   Judgement:  Other:  improving   Insight:  improving  Psychomotor Activity:  Normal  Concentration:  Good  Recall:  Good  Fund of Knowledge:Good  Language: Good  Akathisia:  Negative  Handed:  Right  AIMS (if indicated):     Assets:  Desire for Improvement Resilience  ADL's:  Fair   Cognition: alert and attentive   Sleep:   Number of Hours: 6     Current Medications: Current Facility-Administered Medications  Medication Dose Route Frequency Provider Last Rate Last Dose  . acetaminophen (TYLENOL) tablet 650 mg  650 mg Oral Q6H PRN [id number], MD      . alum & mag hydroxide-simeth (MAALOX/MYLANTA) 200-200-20 MG/5ML suspension 30 mL  30 mL Oral Q4H PRN 12-14-2000, MD      . ARIPiprazole (ABILIFY) tablet 2 mg  2 mg Oral Daily Rachael Fee, NP   2 mg at 12/28/14 0807  . atorvastatin (LIPITOR) tablet 80 mg  80 mg Oral q1800 0808, MD   80 mg at 12/27/14 1717  . B-complex with vitamin C tablet 1 tablet  1 tablet Oral Daily  Sanjuana Kava, NP   1 tablet at 12/28/14 [id number]  . buPROPion (WELLBUTRIN XL) 24 hr tablet 300 mg  300 mg Oral Daily Rachael Fee, MD   300 mg at 12/28/14 4235  . cyclobenzaprine (FLEXERIL) tablet 10 mg  10 mg Oral TID PRN Sanjuana Kava, NP      . diltiazem (CARDIZEM CD) 24 hr capsule 180 mg  180 mg Oral Daily Sanjuana Kava, NP   180 mg at 12/28/14 0807  . diphenhydrAMINE (BENADRYL) capsule 25 mg  25 mg Oral QHS PRN Caprice Kluver, MD      . doxepin (SINEQUAN) capsule 10 mg  10 mg Oral QHS Worthy Flank, NP   10 mg at 12/27/14 2115  . folic acid (FOLVITE) tablet 1 mg  1 mg Oral Daily Craige Cotta, MD   1 mg at 12/28/14 0807  . gabapentin (NEURONTIN) capsule 300 mg  300 mg Oral TID Sanjuana Kava, NP   300 mg at 12/28/14 1212  . hydrOXYzine (ATARAX/VISTARIL) tablet 25 mg  25 mg Oral BID PRN Caprice Kluver, MD   25 mg at 12/27/14 2015  . levothyroxine (SYNTHROID, LEVOTHROID) tablet 25 mcg  25 mcg Oral QAC breakfast Sanjuana Kava, NP   25 mcg at 12/28/14 [phone]  . lidocaine (LIDODERM) 5 % 1 patch  1 patch Transdermal Daily PRN Sanjuana Kava, NP   1 patch at 12/26/14 1441  . magnesium hydroxide (MILK OF MAGNESIA) suspension 30 mL  30 mL Oral Daily PRN Rachael Fee, MD      . pantoprazole (PROTONIX) EC tablet 40 mg  40 mg Oral Daily Sanjuana Kava, NP   40 mg at 12/28/14 0807  .  topiramate (TOPAMAX) tablet 50 mg  50 mg Oral Daily PRN Sanjuana Kava, NP      . Vitamin D (Ergocalciferol) (DRISDOL) capsule 50,000 Units  50,000 Units Oral Q M,W,F Sanjuana Kava, NP   50,000 Units at 12/27/14 4315    Lab Results:  No results found for this or any previous visit (from the past 48 hour(s)).  Physical Findings: AIMS: Facial and Oral Movements Muscles of Facial Expression: None, normal Lips and Perioral Area: None, normal Jaw: None, normal Tongue: None, normal,Extremity Movements Upper (arms, wrists, hands, fingers): None, normal Lower (legs, knees, ankles, toes): None, normal, Trunk Movements Neck, shoulders, hips: None, normal, Overall Severity Severity of abnormal movements (highest score from questions above): None, normal Incapacitation due to abnormal movements: None, normal Patient's awareness of abnormal movements (rate only patient's report): No Awareness, Dental Status Current problems with teeth and/or dentures?: No Does patient usually wear dentures?: No  CIWA:    COWS:      Assessment-  Depression gradually improving. At this time still presenting with constricted affect and some sadness but improved compared to admission and more confident in self . She is tolerating medications well and nausea she had described related to Abilify trial is subsiding and not associated with anorexia or vomiting. She is looking forward to family meeting with husband, if possible, tomorrow .  Treatment Plan Summary: Daily contact with patient to assess and evaluate symptoms and progress in treatment, Medication management, Plan inpatient treatment and medications as below Continue Abilify 2 mgrs QDAY as augmentation strategy for depression Continue Wellbutrin XL 300 mgrs QAM for depression Continue Neurontin 300 mgrs TID for pain  And for anxiety Protonix for GERD symptoms  Vitamin supplementation Continue  zyrtec 10 mg  QHS for allergic rhinitis. Will continue inpatient  treatment until disposition improves.  Medical Decision Making:  Established Problem, Stable/Improving (1), Review of Psycho-Social Stressors (1), Review or order clinical lab tests (1) and Review of New Medication or Change in Dosage (2)   Thedore Mins, MD 12/28/2014, 2:40 PM

## 2014-12-28 NOTE — Progress Notes (Signed)
Adult Services Patient-Family Contact/Session  Attendees:  Patient, husband and CSW  Goal(s):  See if pt will be allowed to return to the home with husband and children  Safety Concerns:  None  Narrative:  Long session was held 1:30-2:45pm allowing pt to express feelings and hopes to husband, and him to share with her why he does not feel she can return to the home right now.  Pt cried a great deal, practically begged him, and he was firm, quite focused on past hurt she has given him.  She was calm and appropriate, although tearful, in telling him about the pain she herself feels currently and in the past.  CSW did psychoeducation re depression, as husband admitted he did not understand.  Pt educated him about her rheumatoid arthritis also and how that affects her motivation and abilities.  He was very focused on how she left the home earlier in the year and how destructive that was, but was able to start talking about what it will take for him to see that she is progressing in her ability to handle her depression with the new medication, participation in therapy and other activities.  CSW strongly recommended [facility] to her.  They were able to come to an agreement that she will live elsewhere, possibly an extended stay hotel with her disability check, and will start to reintegrate into the family through activities with the children and visits.  She asked that he eventually spend some time with her alone, going out to dinner without being embarrassed by her.  This seemed to annoy him, but he was able to accept that it is something she wants to do.  She asked him to go to her therapy appointment on Tuesday at 7pm with her, and he said he was not sure he will be finished with his new job at that point, but will go if home in time.  She asked him to consider whether they can go to regular couples' therapy.  Barrier(s):  Pt's discharge was rescinded due to her emotional state after hearing that  she will definitely not be allowed to return to home with family.  Interventions:  Basic couples' therapy was provided in which CSW helped couple to focus on immediate solutions for the issue regarding her housing, as well as what will be necessary for each of them to feel progress is being made in her recovery and potential return to the home.  Recommendation(s):  Pt discharge to be rescinded, pt to make new plan for living situation.  Follow-up Required:  Yes  Explanation:  CSW to find out new plan  Sarina Ser 12/28/2014, 4:41 PM

## 2014-12-28 NOTE — Progress Notes (Signed)
D: Pt a little disappointed that her husband couldn't attend the scheduled family session; she feels he intentionally didn't come. Pt had several crying spells during visit from youngest daughter. Pt continue to complain of mild depression and moderate anxiety but states that she would continue to focus the positive. Pt denies SI, HI and AVH. Complained of mild pain of 2 on a 0 -10 scale level A: Medications administered as prescribed.  Support, encouragement, and safe environment provided.  15-minute safety checks continue. R: Pt was med compliant.  Safety checks continue.

## 2014-12-29 NOTE — [facility] Group Notes (Signed)
[facility] Group Notes:  (Clinical Social Work)  12/29/2014  [facility] Group Notes:  (Clinical Social Work)  12/29/2014  11:00AM-12:00PM  Summary of Progress/Problems:  The main focus of today's process group was to listen to a variety of genres of music and to identify that different types of music provoke different responses.  The patient then was able to identify personally what was soothing for them, as well as energizing.   The patient expressed knowledge of how each type of music affected her and how this can be used at home as a wellness/recovery tool.  She sang along and really seemed to like only the gospel music.  She was able to also share that she has a new discharge plan to rent the bottom of an old friend's home.  Type of Therapy:  Music Therapy   Participation Level:  Active  Participation Quality:  Attentive and Sharing  Affect:  Blunted  Cognitive:  Oriented  Insight:  Engaged  Engagement in Therapy:  Engaged  Modes of Intervention:   Activity, Exploration  Ambrose Mantle, LCSW 12/29/2014

## 2014-12-29 NOTE — Progress Notes (Signed)
Patient ID: [name] [name], female   DOB: 1965/08/14, 49 y.o.   MRN: [id number]   Pt was discharge home with samples of medication and prescriptions. Pt reported being ready for discharge. Pt reported being negative SI/HI, no AH/VH noted. Pt reported no issues or concerns.

## 2014-12-29 NOTE — [facility] Suicide Risk Assessment (Addendum)
[facility] Discharge Suicide Risk Assessment   Demographic Factors:  married, employed, living with her family  Total Time spent with patient: 30 minutes  Musculoskeletal: Strength & Muscle Tone: within normal limits Gait & Station: normal Patient leans: N/A  Psychiatric Specialty Exam: Physical Exam  Psychiatric: She has a normal mood and affect. Her speech is normal and behavior is normal. Judgment and thought content normal. Cognition and memory are normal.    Review of Systems  Constitutional: Negative.   HENT: Negative.   Eyes: Negative.   Respiratory: Negative.   Cardiovascular: Negative.   Gastrointestinal: Negative.   Genitourinary: Negative.   Musculoskeletal: Negative.   Skin: Negative.   Neurological: Negative.   Endo/Heme/Allergies: Negative.   Psychiatric/Behavioral: Negative.     Blood pressure 110/79, pulse 93, temperature 99 F (37.2 C), temperature source Oral, resp. rate 18, height 5\' 2"  (1.575 m), weight 110.224 kg (243 lb).Body mass index is 44.43 kg/(m^2).  General Appearance: Well Groomed  ::  Good  Speech:  Clear and Coherent  Volume:  Normal  Mood:  Euthymic  Affect:  Appropriate  Thought Process:  Goal Directed and Logical  Orientation:  Full (Time, Place, and Person)  Thought Content:  Negative  Suicidal Thoughts:  No  Homicidal Thoughts:  No  Memory:  Immediate;   Good Recent;   Good Remote;   Good  Judgement:  Fair  Insight:  Fair  Psychomotor Activity:  Normal  Concentration:  Good  Recall:  Good  Fund of Knowledge:Good  Language: Good  Akathisia:  No  Handed:  Right  AIMS (if indicated):     Assets:  Communication Skills Desire for Improvement Financial Resources/Insurance Housing Physical Health Social Support  Sleep:  Number of Hours: 4.5  Cognition: WNL  ADL's:  Intact   Have you used any form of tobacco in the last 30 days? (Cigarettes, Smokeless Tobacco, Cigars, and/or Pipes): No  Has this patient used any form  of tobacco in the last 30 days? (Cigarettes, Smokeless Tobacco, Cigars, and/or Pipes) No  Mental Status Per Nursing Assessment::   On Admission:   (pt states she is "homeless", was kicked out of her house)  Current Mental Status by Physician: patient denies suicidal ideation, intent or plan  Loss Factors: NA  Historical Factors: NA  Risk Reduction Factors:   Sense of responsibility to family, Living with another person, especially a relative, Positive social support, Positive therapeutic relationship and Positive coping skills or problem solving skills  Continued Clinical Symptoms:  Resolving depression  Cognitive Features That Contribute To Risk:  None    Suicide Risk:  Minimal: No identifiable suicidal ideation.  Patients presenting with no risk factors but with morbid ruminations; may be classified as minimal risk based on the severity of the depressive symptoms  Principal Problem: MDD (major depressive disorder), recurrent severe, without psychosis Discharge Diagnoses:  Patient Active Problem List   Diagnosis Date Noted  . MDD (major depressive disorder) [F32.2] 12/28/2014  . MDD (major depressive disorder), recurrent severe, without psychosis [F33.2] 12/22/2014  . Essential hypertension [I10] 08/25/2014  . Morbid obesity [E66.01] 08/25/2014  . CAD (coronary artery disease), native coronary artery [I25.10] 08/24/2014  . Numbness and tingling in left arm [R20.0]   . Chest pressure [R07.89] 08/22/2014  . Arm numbness left [R20.8] 08/22/2014  . Chest pain radiating to jaw [R07.9] 08/22/2014  . Chest pain [R07.9] 08/22/2014  . Coronary artery calcification seen on CAT scan [I25.10] 02/19/2014  . Solitary pulmonary nodule [R91.1] 08/28/2013  .  Chest pain, atypical [R07.89] 07/18/2013  . Heart palpitations [R00.2] 06/27/2013  . Cough [R05] 06/04/2013  . Diastolic dysfunction [I51.9] 05/18/2013  . SOB (shortness of breath) [R06.02] 05/09/2013  . Other malaise and fatigue  [R53.81, R53.83] 01/21/2013  . Stress and adjustment reaction [F43.29] 12/09/2012  . Right sided abdominal pain [R10.9] 11/29/2012    Follow-up Information    Follow up with [facility] On 01/02/2015.   Why:  Appointment scheduled on this date at 12:45 pm for medication management with Dr. Greta Doom information:   [address] [place], [place] [address] Phone: [phone] Fax: [phone]      Follow up with Integrative therapy On 12/31/2014.   Why:  Tuesday at 5:00 with [facility] information:   [address] Bea [place]  [facility]  [phone]      Plan Of Care/Follow-up recommendations:  Activity:  as tolerated Diet:  healthy Tests:  routine  Is patient on multiple antipsychotic therapies at discharge:  No   Has Patient had three or more failed trials of antipsychotic monotherapy by history:  No  Recommended Plan for Multiple Antipsychotic Therapies: NA    Mattisen Pohlmann,MD 12/29/2014, 1:17 PM

## 2014-12-29 NOTE — Progress Notes (Signed)
A: Pt at this time continues to express feels of moderate depression and anxiety; had a family section with husband that did not go as she hoped. Pt however states she is at peace, understanding that her relationship with her husband may get better once she gets better. Pt denies SI, HI and AVH. Complained of severe pain of 7 on a 0 -10 scale level. A: Medications administered as prescribed.  Support, encouragement, and safe environment provided. 15-minute safety checks continue. R: Pt was med compliant.  Safety checks continue.

## 2015-01-01 MED ORDER — DILTIAZEM HCL ER COATED BEADS 180 MG PO CP24: 180.0000 mg | ORAL_CAPSULE | Freq: Every day | ORAL | Status: DC

## 2015-01-01 MED ORDER — FOLIC ACID 1 MG PO TABS: 1.0000 mg | ORAL_TABLET | Freq: Every day | ORAL | Status: DC

## 2015-01-01 NOTE — Discharge Summary (Signed)
Physician Discharge Summary Note  Patient:  [name] [name] is an 49 y.o., female MRN:  [id number] DOB:  07/02/1965 Patient phone:  [phone] (home)  Patient address:   [address] [place] [address],  Total Time spent with patient: 45 minutes  Date of Admission:  12/22/2014 Date of Discharge: 12/29/2014  Reason for Admission:  Depression  Principal Problem: MDD (major depressive disorder), recurrent severe, without psychosis Discharge Diagnoses: Patient Active Problem List   Diagnosis Date Noted  . MDD (major depressive disorder) [F32.2] 12/28/2014  . MDD (major depressive disorder), recurrent severe, without psychosis [F33.2] 12/22/2014  . Essential hypertension [I10] 08/25/2014  . Morbid obesity [E66.01] 08/25/2014  . CAD (coronary artery disease), native coronary artery [I25.10] 08/24/2014  . Numbness and tingling in left arm [R20.0]   . Chest pressure [R07.89] 08/22/2014  . Arm numbness left [R20.8] 08/22/2014  . Chest pain radiating to jaw [R07.9] 08/22/2014  . Chest pain [R07.9] 08/22/2014  . Coronary artery calcification seen on CAT scan [I25.10] 02/19/2014  . Solitary pulmonary nodule [R91.1] 08/28/2013  . Chest pain, atypical [R07.89] 07/18/2013  . Heart palpitations [R00.2] 06/27/2013  . Cough [R05] 06/04/2013  . Diastolic dysfunction [I51.9] 05/18/2013  . SOB (shortness of breath) [R06.02] 05/09/2013  . Other malaise and fatigue [R53.81, R53.83] 01/21/2013  . Stress and adjustment reaction [F43.29] 12/09/2012  . Right sided abdominal pain [R10.9] 11/29/2012    Musculoskeletal: Strength & Muscle Tone: within normal limits Gait & Station: normal Patient leans: N/A  Psychiatric Specialty Exam: Physical Exam  Vitals reviewed.   ROS  Blood pressure 110/79, pulse 93, temperature 99 F (37.2 C), temperature source Oral, resp. rate 18, height 5\' 2"  (1.575 m), weight 110.224 kg (243 lb).Body mass index is 44.43 kg/(m^2).   General Appearance:  Well Groomed  :: Good  Speech: Clear and Coherent  Volume: Normal  Mood: Euthymic  Affect: Appropriate  Thought Process: Goal Directed and Logical  Orientation: Full (Time, Place, and Person)  Thought Content: Negative  Suicidal Thoughts: No  Homicidal Thoughts: No  Memory: Immediate; Good Recent; Good Remote; Good  Judgement: Fair  Insight: Fair  Psychomotor Activity: Normal  Concentration: Good  Recall: Good  Fund of Knowledge:Good  Language: Good  Akathisia: No  Handed: Right  AIMS (if indicated):    Assets: Communication Skills Desire for Improvement Financial Resources/Insurance Housing Physical Health Social Support  Sleep: Number of Hours: 4.5  Cognition: WNL  ADL's: Intact       Have you used any form of tobacco in the last 30 days? (Cigarettes, Smokeless Tobacco, Cigars, and/or Pipes): No  Has this patient used any form of tobacco in the last 30 days? (Cigarettes, Smokeless Tobacco, Cigars, and/or Pipes) N/A  Past Medical History:  Past Medical History  Diagnosis Date  . Rheumatoid arthritis(714.0)   . Depression   . Morbid obesity   . HSV-1 infection   . Diastolic dysfunction   . Fibromyalgia   . Coronary artery calcification seen on CAT scan   . Hiatal hernia   . Esophageal ring     Past Surgical History  Procedure Laterality Date  . Cesarean section    . Tubal ligation    . Laparoscopic gastric sleeve resection  11/21/12    [facility]  . Left heart catheterization with coronary angiogram N/A 08/26/2014    Procedure: LEFT HEART CATHETERIZATION WITH CORONARY ANGIOGRAM;  Surgeon: 08/28/2014, MD;  Location: [facility] CATH LAB;  Service: Cardiovascular;  Laterality: N/A;  .  Esophageal manometry N/A 10/28/2014    Procedure: ESOPHAGEAL MANOMETRY (EM);  Surgeon: Hilarie Fredrickson, MD;  Location: [facility] ENDOSCOPY;  Service: Endoscopy;  Laterality: N/A;  . Hernia repair      reports surgery on 3  hernias, with 2 more present   Family History:  Family History  Problem Relation Age of Onset  . Hyperlipidemia Mother   . Sarcoidosis Father   . Lung disease Father     Pleural Mesothelioma  . Cancer Father   . Heart attack Paternal Grandmother   . Hypertension Paternal Grandmother   . Stroke Neg Hx   . Colon cancer Neg Hx   . Esophageal cancer Neg Hx   . Rectal cancer Neg Hx   . Stomach cancer Neg Hx   . Hypertension Maternal Grandmother   . Diabetes Maternal Grandmother    Social History:  History  Alcohol Use No     History  Drug Use No    History   Social History  . Marital Status: Married    Spouse Name: [name] [name]   . Number of Children: 2  . Years of Education: 16   Occupational History  . CUSTOMER SERVICE [employer]   Social History Main Topics  . Smoking status: Never Smoker   . Smokeless tobacco: Never Used  . Alcohol Use: No  . Drug Use: No  . Sexual Activity: Not on file   Other Topics Concern  . None   Social History Narrative   Marital Status: Married Probation officer)    Children:  Son [name] [name]) Daughter [name] [name])    Pets: None    Living Situation: Lives with husband and children    Occupation: Occupational psychologist Administrator)     Education: Oncologist (Psychology)     Tobacco Use/Exposure:  None    Alcohol Use:  Occasional   Drug Use:  None   Diet:  Regular   Exercise: Walking or Treadmill (2 x week)   Hobbies: Clinical cytogeneticist, [employer].              Risk to Self: What has been your use of drugs/alcohol within the last 12 months?: Pt denies Risk to Others:   Prior Inpatient Therapy:   Prior Outpatient Therapy:    Level of Care:  OP  Hospital Course:  [name] [name] was admitted for MDD (major depressive disorder), recurrent severe, without psychosis and crisis management.  She was treated discharged with the medications listed below under Medication List.  Medical problems were identified and  treated as needed.  Home medications were restarted as appropriate.  Improvement was monitored by observation and [name] [name] daily report of symptom reduction.  Emotional and mental status was monitored by daily self-inventory reports completed by [name] [name] and clinical staff.         [name] [name] was evaluated by the treatment team for stability and plans for continued recovery upon discharge.  [name] [name] motivation was an integral factor for scheduling further treatment.  Employment, transportation, bed availability, health status, family support, and any pending legal issues were also considered during her hospital stay.  She was offered further treatment options upon discharge including but not limited to Residential, Intensive Outpatient, and Outpatient treatment.  [name] [name] will follow up with the services as listed below under Follow Up Information.     Upon completion of this admission the patient was both mentally and medically stable for discharge denying suicidal/homicidal ideation, auditory/visual/tactile hallucinations, delusional thoughts  and paranoia.      Consults:  psychiatry  Significant Diagnostic Studies:  labs: per lab  Discharge Vitals:   Blood pressure 110/79, pulse 93, temperature 99 F (37.2 C), temperature source Oral, resp. rate 18, height 5\' 2"  (1.575 m), weight 110.224 kg (243 lb). Body mass index is 44.43 kg/(m^2). Lab Results:   No results found for this or any previous visit (from the past 72 hour(s)).  Physical Findings: AIMS: Facial and Oral Movements Muscles of Facial Expression: None, normal Lips and Perioral Area: None, normal Jaw: None, normal Tongue: None, normal,Extremity Movements Upper (arms, wrists, hands, fingers): None, normal Lower (legs, knees, ankles, toes): None, normal, Trunk Movements Neck, shoulders, hips: None, normal, Overall Severity Severity of abnormal movements (highest score from  questions above): None, normal Incapacitation due to abnormal movements: None, normal Patient's awareness of abnormal movements (rate only patient's report): No Awareness, Dental Status Current problems with teeth and/or dentures?: No Does patient usually wear dentures?: No  CIWA:    COWS:      See Psychiatric Specialty Exam and Suicide Risk Assessment completed by Attending Physician prior to discharge.  Discharge destination:  Home  Is patient on multiple antipsychotic therapies at discharge:  No   Has Patient had three or more failed trials of antipsychotic monotherapy by history:  No    Recommended Plan for Multiple Antipsychotic Therapies: NA     Medication List    STOP taking these medications        Acyclovir-Hydrocortisone 5-1 % Crea  Commonly known as:  XERESE     Adapalene 0.3 % gel     benzonatate 200 MG capsule  Commonly known as:  TESSALON     Betamethasone Valerate 0.12 % foam     CARAFATE 1 GM/10ML suspension  Generic drug:  sucralfate     cetirizine 10 MG tablet  Commonly known as:  ZYRTEC     clindamycin-benzoyl peroxide gel  Commonly known as:  BENZACLIN     cyclobenzaprine 10 MG tablet  Commonly known as:  FLEXERIL     diclofenac sodium 1 % Gel  Commonly known as:  VOLTAREN     diphenhydrAMINE 25 MG tablet  Commonly known as:  BENADRYL     DULoxetine 30 MG capsule  Commonly known as:  CYMBALTA     EPINEPHrine 0.3 mg/0.3 mL Soaj injection  Commonly known as:  EPI-PEN     famciclovir 500 MG tablet  Commonly known as:  FAMVIR     HYDROcodone-acetaminophen 5-325 MG per tablet  Commonly known as:  NORCO/VICODIN     hydrocortisone 2.5 % lotion     hydrOXYzine 25 MG capsule  Commonly known as:  VISTARIL     hyoscyamine 0.125 MG SL tablet  Commonly known as:  LEVSIN/SL     lidocaine 5 %  Commonly known as:  LIDODERM     methotrexate 25 MG/ML injection     ORENCIA IV     pantoprazole 40 MG tablet  Commonly known as:   PROTONIX     SILENOR 6 MG Tabs  Generic drug:  Doxepin HCl     TL GARD RX 2.2-25-1 MG Tabs  Generic drug:  Folic Acid-Vit B6-Vit B12     traMADol 50 MG tablet  Commonly known as:  ULTRAM     triamcinolone cream 0.1 %  Commonly known as:  KENALOG     Vitamin D (Ergocalciferol) 50000 UNITS Caps capsule  Commonly known as:  DRISDOL  TAKE these medications      Indication   ARIPiprazole 2 MG tablet  Commonly known as:  ABILIFY  Take 1 tablet (2 mg total) by mouth daily.   Indication:  Adjunct to antidepressants.     atorvastatin 80 MG tablet  Commonly known as:  LIPITOR  Take 1 tablet (80 mg total) by mouth daily at 6 PM.   Indication:  high cholesterol     buPROPion 300 MG 24 hr tablet  Commonly known as:  WELLBUTRIN XL  Take 1 tablet (300 mg total) by mouth daily.   Indication:  Major Depressive Disorder     diltiazem 180 MG 24 hr capsule  Commonly known as:  CARTIA XT  Take 1 capsule (180 mg total) by mouth daily.   Indication:  High Blood Pressure     doxepin 10 MG capsule  Commonly known as:  SINEQUAN  Take 1 capsule (10 mg total) by mouth at bedtime.   Indication:  insomnia     fluticasone 50 MCG/ACT nasal spray  Commonly known as:  FLONASE  Place 2 sprays into both nostrils as needed for allergies.   Indication:  Hayfever     folic acid 1 MG tablet  Commonly known as:  FOLVITE  Take 1 tablet (1 mg total) by mouth daily.   Indication:  Deficiency of Folic Acid in the Diet     gabapentin 300 MG capsule  Commonly known as:  NEURONTIN  Take 1 capsule (300 mg total) by mouth 3 (three) times daily.   Indication:  Pain management     levothyroxine 25 MCG tablet  Commonly known as:  SYNTHROID, LEVOTHROID  Take 1 tablet (25 mcg total) by mouth daily before breakfast.   Indication:  Underactive Thyroid     Topiramate ER 50 MG Cp24  Take 50 mg by mouth daily as needed (migraines). Trokendi XR   Indication:  seizure disorder       Follow-up  Information    Follow up with [facility] On 01/02/2015.   Why:  Appointment scheduled on this date at 12:45 pm for medication management with Dr. Greta Doom information:   [address] [place], [place] [address] Phone: [phone] Fax: [phone]      Follow up with Integrative therapy On 12/31/2014.   Why:  Tuesday at 5:00 with [facility] information:   [address]  [facility]  [phone]      Follow-up recommendations:  Activity:  as tol, diet as tol  Comments:  1.  Take all your medications as prescribed.              2.  Report any adverse side effects to outpatient provider.                       3.  Patient instructed to not use alcohol or illegal drugs while on prescription medicines.            4.  In the event of worsening symptoms, instructed patient to call 911, the crisis hotline or go to nearest emergency room for evaluation of symptoms.  Total Discharge Time:  30 min  Signed: Velna Hatchet May Agustin AGNP-BC 01/01/2015, 9:25 AM  Patient seen face-to-face for psychiatric evaluation, chart reviewed and case discussed with the physician extender and developed treatment plan. Reviewed the information documented and agree with the treatment plan. Thedore Mins, MD

## 2015-01-08 ENCOUNTER — Other Ambulatory Visit (HOSPITAL_COMMUNITY): Payer: Self-pay | Admitting: *Deleted

## 2015-01-09 ENCOUNTER — Encounter (HOSPITAL_COMMUNITY)
Admission: RE | Admit: 2015-01-09 | Discharge: 2015-01-09 | Disposition: A | Discharge status: Home / Self Care | Payer: Managed Care, Other (non HMO) | Source: Ambulatory Visit | Attending: Rheumatology | Admitting: Rheumatology

## 2015-01-09 DIAGNOSIS — M069 Rheumatoid arthritis, unspecified: Secondary | ICD-10-CM | POA: Insufficient documentation

## 2015-01-09 MED ORDER — SODIUM CHLORIDE 0.9 % IV SOLN
1000.0000 mg | INTRAVENOUS | Status: DC
  Administered 2015-01-09: 1000 mg via INTRAVENOUS
  Filled 2015-01-09: qty 40

## 2015-01-09 MED ORDER — DIPHENHYDRAMINE HCL 25 MG PO TABS
25.0000 mg | ORAL_TABLET | Freq: Once | ORAL | Status: DC
  Filled 2015-01-09: qty 1

## 2015-01-09 MED ORDER — ACETAMINOPHEN 325 MG PO TABS
650.0000 mg | ORAL_TABLET | ORAL | Status: DC
  Administered 2015-01-09: 650 mg via ORAL

## 2015-01-09 MED ORDER — ACETAMINOPHEN 325 MG PO TABS
ORAL_TABLET | ORAL | Status: AC
  Filled 2015-01-09: qty 2

## 2015-01-09 MED ORDER — SODIUM CHLORIDE 0.9 % IV SOLN
INTRAVENOUS | Status: DC
  Administered 2015-01-09: 13:00:00 via INTRAVENOUS

## 2015-01-23 ENCOUNTER — Ambulatory Visit (INDEPENDENT_AMBULATORY_CARE_PROVIDER_SITE_OTHER)
Admission: RE | Admit: 2015-01-23 | Discharge: 2015-01-23 | Disposition: A | Discharge status: Home / Self Care | Payer: Managed Care, Other (non HMO) | Source: Ambulatory Visit | Attending: Pulmonary Disease | Admitting: Pulmonary Disease

## 2015-01-23 DIAGNOSIS — R0602 Shortness of breath: Secondary | ICD-10-CM

## 2015-01-23 DIAGNOSIS — R911 Solitary pulmonary nodule: Secondary | ICD-10-CM | POA: Diagnosis not present

## 2015-01-30 ENCOUNTER — Encounter: Payer: Self-pay | Admitting: Cardiology

## 2015-01-30 ENCOUNTER — Ambulatory Visit (INDEPENDENT_AMBULATORY_CARE_PROVIDER_SITE_OTHER): Payer: Managed Care, Other (non HMO) | Admitting: Cardiology

## 2015-01-30 ENCOUNTER — Ambulatory Visit (INDEPENDENT_AMBULATORY_CARE_PROVIDER_SITE_OTHER): Payer: Managed Care, Other (non HMO)

## 2015-01-30 VITALS — BP 128/82 | HR 88 | Ht 62.0 in | Wt 252.1 lb

## 2015-01-30 DIAGNOSIS — I2510 Atherosclerotic heart disease of native coronary artery without angina pectoris: Secondary | ICD-10-CM

## 2015-01-30 DIAGNOSIS — E785 Hyperlipidemia, unspecified: Secondary | ICD-10-CM | POA: Diagnosis not present

## 2015-01-30 DIAGNOSIS — I10 Essential (primary) hypertension: Secondary | ICD-10-CM | POA: Diagnosis not present

## 2015-01-30 DIAGNOSIS — R002 Palpitations: Secondary | ICD-10-CM

## 2015-01-30 DIAGNOSIS — R079 Chest pain, unspecified: Secondary | ICD-10-CM

## 2015-01-30 HISTORY — DX: Hyperlipidemia, unspecified: E78.5

## 2015-01-30 NOTE — Addendum Note (Signed)
Addended by: Gunnar Fusi A on: 01/30/2015 04:10 PM   Modules accepted: Orders

## 2015-01-30 NOTE — Patient Instructions (Signed)
Medication Instructions:  Your physician recommends that you continue on your current medications as directed. Please refer to the Current Medication list given to you today.   Labwork: WITHIN ONE WEEK: LFTs, Lipids  Testing/Procedures: Your physician has recommended that you wear an event monitor. Event monitors are medical devices that record the heart's electrical activity. Doctors most often [place] these monitors to diagnose arrhythmias. Arrhythmias are problems with the speed or rhythm of the heartbeat. The monitor is a small, portable device. You can wear one while you do your normal daily activities. This is usually used to diagnose what is causing palpitations/syncope (passing out).  Follow-Up: Your physician wants you to follow-up in: 1 year with Dr. Mayford Knife. You will receive a reminder letter in the mail two months in advance. If you don't receive a letter, please call our office to schedule the follow-up appointment.   Any Other Special Instructions Will Be Listed Below (If Applicable).

## 2015-01-30 NOTE — Progress Notes (Signed)
Cardiology Office Note   Date:  01/30/2015   ID:  [name] [name], DOB 1965/10/25, MRN [id number]  PCP:  Birdena Jubilee, MD    Chief Complaint  Patient presents with  . Follow-up    cad      History of Present Illness: [name] [name] is a 49 y.o. female with a history of diastolic dysfunction, RA, depression who presents back today. She initially saw me for atypical CP and cardiac workup with nuclear stress test and echo were normal with diastolic dysfunction on echo. A coronary CTA showed calcium score in the 95th% but no obstructive ASCAD (30% prox LAD stenosis).  She denies any chest pain.  She has chronic SOB that is fairly stable.  She occasionally has some fluttering in her chest for a few minutes.  She denies any syncope.  She has had some LE edema of the left foot which is also sore to move around.  She does not recall injuring it.     Past Medical History  Diagnosis Date  . Rheumatoid arthritis(714.0)   . Depression   . Morbid obesity   . HSV-1 infection   . Diastolic dysfunction   . Fibromyalgia   . Hiatal hernia   . Esophageal ring   . Dyslipidemia 01/30/2015  . Coronary artery calcification seen on CAT scan     minimal CAD with 30% prox and mild LAD    Past Surgical History  Procedure Laterality Date  . Cesarean section    . Tubal ligation    . Laparoscopic gastric sleeve resection  11/21/12    [facility]  . Left heart catheterization with coronary angiogram N/A 08/26/2014    Procedure: LEFT HEART CATHETERIZATION WITH CORONARY ANGIOGRAM;  Surgeon: Runell Gess, MD;  Location: [name];  Service: Cardiovascular;  Laterality: N/A;  . Esophageal manometry N/A 10/28/2014    Procedure: ESOPHAGEAL MANOMETRY (EM);  Surgeon: Hilarie Fredrickson, MD;  Location: [facility];  Service: Endoscopy;  Laterality: N/A;  . Hernia repair      reports surgery on 3 hernias, with 2 more present     Current Outpatient Prescriptions  Medication Sig  Dispense Refill  . Adapalene 0.3 % gel     . ARIPiprazole (ABILIFY) 2 MG tablet Take 1 tablet (2 mg total) by mouth daily. 30 tablet 0  . atorvastatin (LIPITOR) 80 MG tablet Take 1 tablet (80 mg total) by mouth daily at 6 PM. 30 tablet 11  . buPROPion (WELLBUTRIN XL) 300 MG 24 hr tablet Take 1 tablet (300 mg total) by mouth daily. 30 tablet 0  . CARAFATE 1 GM/10ML suspension TAKE 10 MLS (1 G TOTAL) BY MOUTH 4 (FOUR) TIMES DAILY - WITH MEALS AND AT BEDTIME.  0  . cyclobenzaprine (FLEXERIL) 10 MG tablet TAKE 1 TABLET BY MOUTH EVERY 6-8 HOURS AS NEEDED FOR MUSCLE SPASMS  0  . diclofenac sodium (VOLTAREN) 1 % GEL     . diltiazem (CARTIA XT) 180 MG 24 hr capsule Take 1 capsule (180 mg total) by mouth daily. 90 capsule 1  . doxepin (SINEQUAN) 10 MG capsule Take 1 capsule (10 mg total) by mouth at bedtime. 30 capsule 0  . DULoxetine (CYMBALTA) 30 MG capsule Take 30 mg by mouth 2 (two) times daily.     Marland Kitchen EPIPEN 2-PAK 0.3 MG/0.3ML SOAJ injection Inject 0.3 mg into the skin once.     Marland Kitchen  famciclovir (FAMVIR) 500 MG tablet TAKE 1 TAB BY MOUTH DAILY FOR PREVENTION OR 3 TABS AT ONSET OF FEVER BLISTER X 1 DAY  11  . fluticasone (FLONASE) 50 MCG/ACT nasal spray Place 2 sprays into both nostrils as needed for allergies. 16 g 11  . folic acid (FOLVITE) 1 MG tablet Take 1 tablet (1 mg total) by mouth daily. 30 tablet 0  . gabapentin (NEURONTIN) 300 MG capsule Take 1 capsule (300 mg total) by mouth 3 (three) times daily. 90 capsule 0  . HYDROcodone-acetaminophen (NORCO/VICODIN) 5-325 MG per tablet TAKE 1 TIDAN FOR SEVERE PAIN, (MAY FILL 12/18/14)  0  . hydrOXYzine (VISTARIL) 25 MG capsule     . levothyroxine (SYNTHROID, LEVOTHROID) 25 MCG tablet Take 1 tablet (25 mcg total) by mouth daily before breakfast. 30 tablet 0  . OTREXUP 25 MG/0.4ML SOAJ     . pantoprazole (PROTONIX) 40 MG tablet Take 40 mg by mouth daily.    . TL GARD RX 2.2-25-1 MG TABS     . Topiramate ER 50 MG CP24 Take 50 mg by mouth daily as needed  (migraines). Trokendi XR 30 capsule   . Vitamin D, Ergocalciferol, (DRISDOL) 50000 UNITS CAPS capsule Take 500,000 Units by mouth 3 (three) times a week. Mon, wed, fri    . XERESE 5-1 % CREA      No current facility-administered medications for this visit.    Allergies:   Flu virus vaccine and Influenza vaccines    Social History:  The patient  reports that she has never smoked. She has never used smokeless tobacco. She reports that she does not drink alcohol or use illicit drugs.   Family History:  The patient's family history includes Cancer in her father; Diabetes in her maternal grandmother; Heart attack in her paternal grandmother; Hyperlipidemia in her mother; Hypertension in her maternal grandmother and paternal grandmother; Lung disease in her father; Sarcoidosis in her father. There is no history of Stroke, Colon cancer, Esophageal cancer, Rectal cancer, or Stomach cancer.    ROS:  Please see the history of present illness.   Otherwise, review of systems are positive for none.   All other systems are reviewed and negative.    PHYSICAL EXAM: VS:  BP 128/82 mmHg  Pulse 88  Ht 5\' 2"  (1.575 m)  Wt 252 lb 1.9 oz (114.361 kg)  BMI 46.10 kg/m2  SpO2 99% , BMI Body mass index is 46.1 kg/(m^2). GEN: Well nourished, well developed, in no acute distress HEENT: normal Neck: no JVD, carotid bruits, or masses Cardiac: RRR; no murmurs, rubs, or gallops,no edema  Respiratory:  clear to auscultation bilaterally, normal work of breathing GI: soft, nontender, nondistended, + BS MS: no deformity or atrophy Skin: warm and dry, no rash Neuro:  Strength and sensation are intact Psych: euthymic mood, full affect   EKG:  EKG is not ordered today.    Recent Labs: 08/22/2014: B Natriuretic Peptide 11.2 12/12/2014: ALT 16 12/21/2014: Hemoglobin 13.9; Platelets 262 12/25/2014: BUN 9; Creatinine, Ser 0.84; Potassium 3.7; Sodium 140    Lipid Panel    Component Value Date/Time   CHOL 182  08/23/2014 0250   TRIG 62 08/23/2014 0250   HDL 49 08/23/2014 0250   CHOLHDL 3.7 08/23/2014 0250   VLDL 12 08/23/2014 0250   LDLCALC 121* 08/23/2014 0250      Wt Readings from Last 3 Encounters:  01/30/15 252 lb 1.9 oz (114.361 kg)  01/09/15 242 lb (109.77 kg)  12/22/14 243 lb (110.224  kg)    ASSESSMENT AND PLAN:  1. Atypical CP with normal nuclear stress test and echo. Coronary CTA calcium Score 128 isolated to foci in proximal and mid LAD 98th percentile for age and sex matched controls with Less than 30% calcific disease in proximal and mid LAD. No obstructive CAD byt CTA and normal coronary arteries by cath. Her CP has resolved. Continue ASA 81mg  daily. She needs aggressive risk factor modification.  2. Diastolic dysfunction by echo 3. Palpitations - occasionally she has some feels like they have changed in frequency and duration.  I will get an event monitor to reassess. Continue Diltiazem 4.  Minimal ASCAD of the LAD on coronary CTA but no CAD On cath.  on ASA.   5.  Dyslipidemia - her last LDL was 121 in March.  Her goal is 70.  She is trying to follow a low fat diet.  I will recheck her lipids.   6.  Left foot swelling - she does not have any pitting edema.  Her foot is sore when she moves it around so I suspect this is musculoskeletal.   Current medicines are reviewed at length with the patient today.  The patient does not have concerns regarding medicines.  The following changes have been made:  no change  Labs/ tests ordered today: See above Assessment and Plan No orders of the defined types were placed in this encounter.     Disposition:   FU with me in 1 year  Signed, April, MD  01/30/2015 2:09 PM    [facility] [address] [place], [place], [place]  [place] Phone: [phone]; Fax: [phone]

## 2015-02-06 ENCOUNTER — Other Ambulatory Visit (INDEPENDENT_AMBULATORY_CARE_PROVIDER_SITE_OTHER): Payer: Managed Care, Other (non HMO) | Admitting: *Deleted

## 2015-02-06 DIAGNOSIS — E785 Hyperlipidemia, unspecified: Secondary | ICD-10-CM | POA: Diagnosis not present

## 2015-02-06 LAB — HEPATIC FUNCTION PANEL
ALT: 15 U/L (ref 0–35)
AST: 18 U/L (ref 0–37)
Albumin: 3.6 g/dL (ref 3.5–5.2)
Alkaline Phosphatase: 77 U/L (ref 39–117)
BILIRUBIN TOTAL: 0.4 mg/dL (ref 0.2–1.2)
Bilirubin, Direct: 0.1 mg/dL (ref 0.0–0.3)
TOTAL PROTEIN: 6.6 g/dL (ref 6.0–8.3)

## 2015-02-06 LAB — LIPID PANEL
Cholesterol: 152 mg/dL (ref 0–200)
HDL: 55.5 mg/dL (ref >39.00)
LDL CALC: 80 mg/dL (ref 0–99)
NonHDL: 96.85
TRIGLYCERIDES: 82 mg/dL (ref 0.0–149.0)
Total CHOL/HDL Ratio: 3
VLDL: 16.4 mg/dL (ref 0.0–40.0)

## 2015-02-11 ENCOUNTER — Encounter (HOSPITAL_COMMUNITY)
Admission: RE | Admit: 2015-02-11 | Discharge: 2015-02-11 | Disposition: A | Discharge status: Home / Self Care | Payer: Managed Care, Other (non HMO) | Source: Ambulatory Visit | Attending: Rheumatology | Admitting: Rheumatology

## 2015-02-11 DIAGNOSIS — M069 Rheumatoid arthritis, unspecified: Secondary | ICD-10-CM | POA: Insufficient documentation

## 2015-02-11 LAB — COMPREHENSIVE METABOLIC PANEL
ALT: 20 U/L (ref 14–54)
ANION GAP: 7 (ref 5–15)
AST: 34 U/L (ref 15–41)
Albumin: 3.4 g/dL — ABNORMAL LOW (ref 3.5–5.0)
Alkaline Phosphatase: 74 U/L (ref 38–126)
BUN: 10 mg/dL (ref 6–20)
CHLORIDE: 108 mmol/L (ref 101–111)
CO2: 26 mmol/L (ref 22–32)
CREATININE: 1.04 mg/dL — AB (ref 0.44–1.00)
Calcium: 9.2 mg/dL (ref 8.9–10.3)
Glucose, Bld: 87 mg/dL (ref 65–99)
POTASSIUM: 4.6 mmol/L (ref 3.5–5.1)
Sodium: 141 mmol/L (ref 135–145)
Total Bilirubin: 1.1 mg/dL (ref 0.3–1.2)
Total Protein: 6.6 g/dL (ref 6.5–8.1)

## 2015-02-11 LAB — CBC WITH DIFFERENTIAL/PLATELET
Basophils Absolute: 0 10*3/uL (ref 0.0–0.1)
Basophils Relative: 0 % (ref 0–1)
EOS PCT: 1 % (ref 0–5)
Eosinophils Absolute: 0.1 10*3/uL (ref 0.0–0.7)
HCT: 41.7 % (ref 36.0–46.0)
Hemoglobin: 13.2 g/dL (ref 12.0–15.0)
LYMPHS ABS: 4.7 10*3/uL — AB (ref 0.7–4.0)
LYMPHS PCT: 43 % (ref 12–46)
MCH: 26.8 pg (ref 26.0–34.0)
MCHC: 31.7 g/dL (ref 30.0–36.0)
MCV: 84.8 fL (ref 78.0–100.0)
MONO ABS: 0.7 10*3/uL (ref 0.1–1.0)
Monocytes Relative: 7 % (ref 3–12)
Neutro Abs: 5.2 10*3/uL (ref 1.7–7.7)
Neutrophils Relative %: 49 % (ref 43–77)
PLATELETS: 285 10*3/uL (ref 150–400)
RBC: 4.92 MIL/uL (ref 3.87–5.11)
RDW: 13 % (ref 11.5–15.5)
WBC: 10.8 10*3/uL — ABNORMAL HIGH (ref 4.0–10.5)

## 2015-02-11 MED ORDER — ACETAMINOPHEN 325 MG PO TABS
ORAL_TABLET | ORAL | Status: AC
  Administered 2015-02-11: 650 mg via ORAL
  Filled 2015-02-11: qty 2

## 2015-02-11 MED ORDER — ABATACEPT 250 MG IV SOLR
1000.0000 mg | INTRAVENOUS | Status: DC
  Administered 2015-02-11: 1000 mg via INTRAVENOUS
  Filled 2015-02-11: qty 40

## 2015-02-11 MED ORDER — ACETAMINOPHEN 325 MG PO TABS
650.0000 mg | ORAL_TABLET | ORAL | Status: DC
  Administered 2015-02-11: 650 mg via ORAL

## 2015-02-11 MED ORDER — DIPHENHYDRAMINE HCL 25 MG PO CAPS: 25.0000 mg | ORAL_CAPSULE | ORAL | Status: DC

## 2015-02-11 MED ORDER — SODIUM CHLORIDE 0.9 % IV SOLN
Freq: Once | INTRAVENOUS | Status: AC
  Administered 2015-02-11: 13:00:00 via INTRAVENOUS

## 2015-02-18 ENCOUNTER — Other Ambulatory Visit: Payer: Self-pay | Admitting: Physician Assistant

## 2015-03-11 ENCOUNTER — Encounter (HOSPITAL_COMMUNITY)
Admission: RE | Admit: 2015-03-11 | Discharge: 2015-03-11 | Disposition: A | Discharge status: Home / Self Care | Payer: Managed Care, Other (non HMO) | Source: Ambulatory Visit | Attending: Rheumatology | Admitting: Rheumatology

## 2015-03-11 DIAGNOSIS — M069 Rheumatoid arthritis, unspecified: Secondary | ICD-10-CM | POA: Diagnosis not present

## 2015-03-11 MED ORDER — ACETAMINOPHEN 325 MG PO TABS
ORAL_TABLET | ORAL | Status: AC
  Filled 2015-03-11: qty 2

## 2015-03-11 MED ORDER — DIPHENHYDRAMINE HCL 25 MG PO CAPS
ORAL_CAPSULE | ORAL | Status: AC
  Filled 2015-03-11: qty 1

## 2015-03-11 MED ORDER — SODIUM CHLORIDE 0.9 % IV SOLN
1000.0000 mg | INTRAVENOUS | Status: AC
  Administered 2015-03-11: 1000 mg via INTRAVENOUS
  Filled 2015-03-11: qty 40

## 2015-03-11 MED ORDER — ACETAMINOPHEN 325 MG PO TABS
650.0000 mg | ORAL_TABLET | Freq: Once | ORAL | Status: AC
  Administered 2015-03-11: 650 mg via ORAL

## 2015-03-11 MED ORDER — SODIUM CHLORIDE 0.9 % IV SOLN
Freq: Once | INTRAVENOUS | Status: AC
  Administered 2015-03-11: 09:00:00 via INTRAVENOUS

## 2015-03-11 MED ORDER — DIPHENHYDRAMINE HCL 25 MG PO CAPS
25.0000 mg | ORAL_CAPSULE | Freq: Once | ORAL | Status: AC
  Administered 2015-03-11: 25 mg via ORAL

## 2015-03-12 ENCOUNTER — Ambulatory Visit: Payer: Self-pay | Admitting: Cardiology

## 2015-03-13 ENCOUNTER — Other Ambulatory Visit: Payer: Self-pay | Admitting: Physician Assistant

## 2015-03-26 ENCOUNTER — Encounter (HOSPITAL_COMMUNITY): Payer: Self-pay | Admitting: Psychiatry

## 2015-03-26 ENCOUNTER — Other Ambulatory Visit (HOSPITAL_COMMUNITY): Payer: Managed Care, Other (non HMO) | Attending: Psychiatry | Admitting: Psychiatry

## 2015-03-26 DIAGNOSIS — M797 Fibromyalgia: Secondary | ICD-10-CM | POA: Insufficient documentation

## 2015-03-26 DIAGNOSIS — F419 Anxiety disorder, unspecified: Secondary | ICD-10-CM | POA: Diagnosis not present

## 2015-03-26 DIAGNOSIS — I10 Essential (primary) hypertension: Secondary | ICD-10-CM | POA: Insufficient documentation

## 2015-03-26 DIAGNOSIS — F332 Major depressive disorder, recurrent severe without psychotic features: Secondary | ICD-10-CM | POA: Insufficient documentation

## 2015-03-26 DIAGNOSIS — M069 Rheumatoid arthritis, unspecified: Secondary | ICD-10-CM | POA: Insufficient documentation

## 2015-03-26 DIAGNOSIS — F410 Panic disorder [episodic paroxysmal anxiety] without agoraphobia: Secondary | ICD-10-CM | POA: Insufficient documentation

## 2015-03-26 DIAGNOSIS — G4710 Hypersomnia, unspecified: Secondary | ICD-10-CM | POA: Insufficient documentation

## 2015-03-26 DIAGNOSIS — F331 Major depressive disorder, recurrent, moderate: Secondary | ICD-10-CM
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
## 2015-03-26 NOTE — Progress Notes (Signed)
03/26/15 Encounter: 10:00- 10:45 AM  Purpose: Performing general wellness check-in. Intervention: Open guided discussion on relevant issues in recovery. Effect: Rosaleen listened quietly to group session.  Donnamarie Rossetti CPSS

## 2015-03-27 ENCOUNTER — Other Ambulatory Visit (HOSPITAL_COMMUNITY): Payer: Managed Care, Other (non HMO) | Admitting: Psychiatry

## 2015-03-27 DIAGNOSIS — F331 Major depressive disorder, recurrent, moderate: Secondary | ICD-10-CM

## 2015-03-27 DIAGNOSIS — F332 Major depressive disorder, recurrent severe without psychotic features: Secondary | ICD-10-CM | POA: Diagnosis not present

## 2015-03-27 NOTE — Progress Notes (Signed)
03/27/15 9:15 AM - 12:00 PM  Purpose: To affirm the statement "I am enough". Intervention: Open guided discussion on practical self-care & increasing self-worth. Effect: Harvin Hazel stated that she plays the role of "the giver" to increase her sense of self-worth.  Donnamarie Rossetti CPSS

## 2015-03-28 ENCOUNTER — Other Ambulatory Visit: Payer: Self-pay | Admitting: Specialist

## 2015-03-28 ENCOUNTER — Other Ambulatory Visit (HOSPITAL_COMMUNITY): Payer: Managed Care, Other (non HMO) | Admitting: Psychiatry

## 2015-03-28 DIAGNOSIS — M545 Low back pain, unspecified: Secondary | ICD-10-CM

## 2015-03-28 DIAGNOSIS — F332 Major depressive disorder, recurrent severe without psychotic features: Secondary | ICD-10-CM | POA: Diagnosis not present

## 2015-03-28 NOTE — Progress Notes (Signed)
    Daily Group Progress Note  Program: IOP  Group Time: 9:00-10:30  Participation Level: Active  Behavioral Response: Appropriate  Type of Therapy:  Group Therapy  Summary of Progress: Pt. Met with case manager for intake.      Group Time: 10:30-12:00  Participation Level:  Active  Behavioral Response: Appropriate  Type of Therapy: Psycho-education Group  Summary of Progress: Pt. Participated in yoga session facilitated by Jan Fireman, LPC for energy and increasing mood.   Nancie Neas, LPC

## 2015-03-28 NOTE — Progress Notes (Signed)
    Daily Group Progress Note  Program: IOP  Group Time: 9:00-10:30  Participation Level: Active  Behavioral Response: Appropriate  Type of Therapy:  Group Therapy  Summary of Progress: Pt. Presents with primarily flat affect, depressed, alert and engaged in the group process. Pt. Reported that she talked to her husband and although they are currently separated, she is hopeful that they will be able to reconcile. Pt. Reports that she moved out of the home while he is at work and that trust in the relationship was damaged as a result.     Group Time: 10:30-12:00  Participation Level:  Active  Behavioral Response: Appropriate  Type of Therapy: Psycho-education Group  Summary of Progress: Pt. Participated in grief and loss group facilitated by Theda Belfast.   Shaune Pollack, LPC

## 2015-03-28 NOTE — Progress Notes (Signed)
    Daily Group Progress Note  Program: IOP  Group Time: 9:00-10:30  Participation Level: Active  Behavioral Response: Appropriate  Type of Therapy:  Group Therapy  Summary of Progress: Pt. Presents with primarily flat affect, soft-spoken, appears to be depressed, but alert and engaged in the group process. Pt. Discussed the importance of self-care and stress management for the management of her depression and auto-immune disease.      Group Time: 10:30-12:00  Participation Level:  Active  Behavioral Response: Appropriate  Type of Therapy: Psycho-education Group  Summary of Progress: Pt. Participated in discussion about core self-worth co-facilitated by Donnamarie Rossetti. Pt. Discussed model of I am good enough-I treat myself with kindness-My tank is full-I am able to be fully present for others.   Shaune Pollack, LPC

## 2015-03-31 ENCOUNTER — Other Ambulatory Visit (HOSPITAL_COMMUNITY): Payer: Managed Care, Other (non HMO) | Admitting: Psychiatry

## 2015-03-31 DIAGNOSIS — F332 Major depressive disorder, recurrent severe without psychotic features: Secondary | ICD-10-CM | POA: Diagnosis not present

## 2015-03-31 NOTE — Progress Notes (Signed)
    Daily Group Progress Note  Program: IOP  Group Time: 0900-1045  Participation Level: Active  Behavioral Response: Appropriate and Sharing  Type of Therapy:  Group Therapy  Summary of Progress: Pt voiced that she has been in pain all weekend.  States she stayed in bed the whole weekend.  Reports she has an upcoming doctor's appointment to determine future treatment.  Pt voiced her disappointment in her Rheumatoid doctor.  According to pt, the doctor was more attentive whenever she first saw her years ago; but has become less attentive.  Pt states she will be switching to another provider because the current one is releasing her back to work.     Group Time: 1100-1200  Participation Level:  Minimal  Behavioral Response: Appropriate  Type of Therapy: Psycho-education Group  Summary of Progress: Peggye Fothergill, Pharm-D discussed and answered questions about various medications.   Chestine Spore, RITA, M.Ed, CNA

## 2015-04-01 ENCOUNTER — Other Ambulatory Visit (HOSPITAL_COMMUNITY): Payer: Managed Care, Other (non HMO) | Admitting: Psychiatry

## 2015-04-01 DIAGNOSIS — F331 Major depressive disorder, recurrent, moderate: Secondary | ICD-10-CM

## 2015-04-01 DIAGNOSIS — F332 Major depressive disorder, recurrent severe without psychotic features: Secondary | ICD-10-CM | POA: Diagnosis not present

## 2015-04-01 NOTE — Progress Notes (Signed)
    Daily Group Progress Note  Program: IOP  Group Time: 0900-1045  Participation Level: Active  Behavioral Response: Appropriate and Sharing  Type of Therapy:  Group Therapy  Summary of Progress: Pt voiced that although she was in pain this morning she pushed through and came to MH-IOP.  Pt voiced that she practices self care by getting her hair, nails done and getting massages.  Pt plans to get a massage today. Provided a certain peer with positive feedback.     Group Time: 1100-1200  Participation Level:  Active  Behavioral Response: Appropriate  Type of Therapy: Psycho-education Group  Summary of Progress: Fernanda Drum (Librarian, academic at [facility]) spoke to the group about all the services they provide within the community.  She provided everyone with a brochure and explained the process of attending a group or enrolling into any of their services.   Chestine Spore, RITA, M.Ed, CNA

## 2015-04-02 ENCOUNTER — Other Ambulatory Visit (HOSPITAL_COMMUNITY): Payer: Managed Care, Other (non HMO) | Admitting: Psychiatry

## 2015-04-02 ENCOUNTER — Ambulatory Visit
Admission: RE | Admit: 2015-04-02 | Discharge: 2015-04-02 | Disposition: A | Discharge status: Home / Self Care | Payer: Managed Care, Other (non HMO) | Source: Ambulatory Visit | Attending: Specialist | Admitting: Specialist

## 2015-04-02 DIAGNOSIS — M545 Low back pain, unspecified: Secondary | ICD-10-CM

## 2015-04-02 DIAGNOSIS — F332 Major depressive disorder, recurrent severe without psychotic features: Secondary | ICD-10-CM | POA: Diagnosis not present

## 2015-04-02 NOTE — Progress Notes (Signed)
    Daily Group Progress Note  Program: IOP  Group Time: 9:00-10:30  Participation Level: Active  Behavioral Response: Appropriate  Type of Therapy:  Group Therapy  Summary of Progress: Pt. Presented as talkative, engaged in the group process. Pt. Reported that she had a significant conversation with her husband and identified burden of attempting to please family members with the car that she drives, home that she lives in, etc. Pt. Recognizes that this has been a burden for her allowed herself to be affected by feeling that she was not meeting the approval of others. Pt. Participated in discussion about developing sensory awareness. Pt. Was able to identify her appreciation of the scent of sweet potato pie. Pt. Also discussed her love of creating decorative items and baking.      Group Time: 10:30-12:00  Participation Level:  Active  Behavioral Response: Appropriate  Type of Therapy: Psycho-education Group  Summary of Progress: Pt. Participated in discussion about responding to negative self-talk and understanding core feelings of inadequacy and poor self-worth.  Shaune Pollack, LPC

## 2015-04-03 ENCOUNTER — Other Ambulatory Visit (HOSPITAL_COMMUNITY): Payer: Managed Care, Other (non HMO) | Admitting: Psychiatry

## 2015-04-03 DIAGNOSIS — F331 Major depressive disorder, recurrent, moderate: Secondary | ICD-10-CM

## 2015-04-03 DIAGNOSIS — F332 Major depressive disorder, recurrent severe without psychotic features: Secondary | ICD-10-CM | POA: Diagnosis not present

## 2015-04-03 NOTE — Progress Notes (Signed)
04/03/15 Encounter: 9 AM - 12 PM  Purpose: Addressing Maslow's "Hierarchy of Needs". Intervention: Open guided discussion on trauma-related coping skills. Effect: Liah concurred on the importance of having both physical & emotional needs met in recovery.    CPSS  

## 2015-04-04 ENCOUNTER — Other Ambulatory Visit (HOSPITAL_COMMUNITY): Payer: Managed Care, Other (non HMO) | Admitting: Psychiatry

## 2015-04-04 DIAGNOSIS — F331 Major depressive disorder, recurrent, moderate: Secondary | ICD-10-CM

## 2015-04-04 DIAGNOSIS — F332 Major depressive disorder, recurrent severe without psychotic features: Secondary | ICD-10-CM | POA: Diagnosis not present
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
## 2015-04-07 ENCOUNTER — Other Ambulatory Visit (HOSPITAL_COMMUNITY): Payer: Self-pay | Admitting: *Deleted

## 2015-04-07 ENCOUNTER — Other Ambulatory Visit (HOSPITAL_COMMUNITY): Payer: Managed Care, Other (non HMO) | Admitting: Psychiatry

## 2015-04-07 DIAGNOSIS — F332 Major depressive disorder, recurrent severe without psychotic features: Secondary | ICD-10-CM | POA: Diagnosis not present

## 2015-04-07 DIAGNOSIS — F331 Major depressive disorder, recurrent, moderate: Secondary | ICD-10-CM

## 2015-04-07 NOTE — Progress Notes (Signed)
    Daily Group Progress Note  Program: IOP  Group Time: 9:00-10:30  Participation Level: Active  Behavioral Response: Appropriate  Type of Therapy:  Psycho-education Group  Summary of Progress: Pt. Participated in medication management group with Michelle Nasuti.      Group Time: 10:30-12:00  Participation Level:  Active  Behavioral Response: Appropriate  Type of Therapy: Group Therapy  Summary of Progress: Pt. Participated in discussion about mindfulness lead by speaker. Pt. Shared that she was concerned about back pain and expecting information from her orthopedist today about MRI results.   Shaune Pollack, LPC

## 2015-04-08 ENCOUNTER — Encounter (HOSPITAL_COMMUNITY): Payer: Self-pay

## 2015-04-08 ENCOUNTER — Other Ambulatory Visit (HOSPITAL_COMMUNITY): Payer: Managed Care, Other (non HMO) | Attending: Psychiatry | Admitting: Psychiatry

## 2015-04-08 DIAGNOSIS — F332 Major depressive disorder, recurrent severe without psychotic features: Secondary | ICD-10-CM | POA: Diagnosis present

## 2015-04-08 DIAGNOSIS — F419 Anxiety disorder, unspecified: Secondary | ICD-10-CM | POA: Diagnosis not present

## 2015-04-08 DIAGNOSIS — F331 Major depressive disorder, recurrent, moderate: Secondary | ICD-10-CM
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
## 2015-04-09 ENCOUNTER — Other Ambulatory Visit (HOSPITAL_COMMUNITY): Payer: Managed Care, Other (non HMO) | Admitting: Psychiatry

## 2015-04-09 DIAGNOSIS — F331 Major depressive disorder, recurrent, moderate: Secondary | ICD-10-CM

## 2015-04-09 DIAGNOSIS — F332 Major depressive disorder, recurrent severe without psychotic features: Secondary | ICD-10-CM | POA: Diagnosis not present

## 2015-04-09 NOTE — Progress Notes (Signed)
    Daily Group Progress Note  Program: IOP  Group Time: 9:00-10:30  Participation Level: Active  Behavioral Response: Appropriate  Type of Therapy:  Group Therapy  Summary of Progress: Pt. Presented with brightened affect and improved energy compared to yesterday's group. Pt. Was actively engaged in the group process and shared feedback with group member regarding parenting challenges. Pt. Has been using crocheting during group to assist with grounding.      Group Time: 10:30-12:00  Participation Level:  Active  Behavioral Response: Appropriate  Type of Therapy: Psycho-education Group  Summary of Progress: Pt. Actively listened to instruction on the grounding series and participated in 4-3-8 breathing and bumble bee breath exercises.   Shaune Pollack, LPC

## 2015-04-10 ENCOUNTER — Other Ambulatory Visit (HOSPITAL_COMMUNITY): Payer: Managed Care, Other (non HMO)

## 2015-04-11 ENCOUNTER — Other Ambulatory Visit (HOSPITAL_COMMUNITY): Payer: Managed Care, Other (non HMO) | Admitting: Psychiatry

## 2015-04-11 DIAGNOSIS — F331 Major depressive disorder, recurrent, moderate: Secondary | ICD-10-CM

## 2015-04-11 DIAGNOSIS — F332 Major depressive disorder, recurrent severe without psychotic features: Secondary | ICD-10-CM | POA: Diagnosis not present

## 2015-04-14 ENCOUNTER — Encounter (HOSPITAL_COMMUNITY)
Admission: RE | Admit: 2015-04-14 | Discharge: 2015-04-14 | Disposition: A | Discharge status: Home / Self Care | Payer: Managed Care, Other (non HMO) | Source: Ambulatory Visit | Attending: Rheumatology | Admitting: Rheumatology

## 2015-04-14 ENCOUNTER — Other Ambulatory Visit (HOSPITAL_COMMUNITY): Payer: Managed Care, Other (non HMO) | Admitting: Psychiatry

## 2015-04-14 DIAGNOSIS — M069 Rheumatoid arthritis, unspecified: Secondary | ICD-10-CM | POA: Insufficient documentation

## 2015-04-14 DIAGNOSIS — F331 Major depressive disorder, recurrent, moderate: Secondary | ICD-10-CM

## 2015-04-14 DIAGNOSIS — F332 Major depressive disorder, recurrent severe without psychotic features: Secondary | ICD-10-CM | POA: Diagnosis not present

## 2015-04-14 LAB — CBC WITH DIFFERENTIAL/PLATELET
BASOS ABS: 0 10*3/uL (ref 0.0–0.1)
Basophils Relative: 0 %
EOS PCT: 3 %
Eosinophils Absolute: 0.2 10*3/uL (ref 0.0–0.7)
HEMATOCRIT: 43.4 % (ref 36.0–46.0)
Hemoglobin: 13.8 g/dL (ref 12.0–15.0)
LYMPHS PCT: 40 %
Lymphs Abs: 3.7 10*3/uL (ref 0.7–4.0)
MCH: 27.1 pg (ref 26.0–34.0)
MCHC: 31.8 g/dL (ref 30.0–36.0)
MCV: 85.1 fL (ref 78.0–100.0)
MONO ABS: 0.6 10*3/uL (ref 0.1–1.0)
MONOS PCT: 6 %
NEUTROS ABS: 4.7 10*3/uL (ref 1.7–7.7)
Neutrophils Relative %: 51 %
PLATELETS: 250 10*3/uL (ref 150–400)
RBC: 5.1 MIL/uL (ref 3.87–5.11)
RDW: 12.6 % (ref 11.5–15.5)
WBC: 9.1 10*3/uL (ref 4.0–10.5)

## 2015-04-14 LAB — COMPREHENSIVE METABOLIC PANEL
ALT: 21 U/L (ref 14–54)
ANION GAP: 9 (ref 5–15)
AST: 26 U/L (ref 15–41)
Albumin: 3.4 g/dL — ABNORMAL LOW (ref 3.5–5.0)
Alkaline Phosphatase: 79 U/L (ref 38–126)
BILIRUBIN TOTAL: 0.4 mg/dL (ref 0.3–1.2)
BUN: 7 mg/dL (ref 6–20)
CHLORIDE: 105 mmol/L (ref 101–111)
CO2: 26 mmol/L (ref 22–32)
Calcium: 8.9 mg/dL (ref 8.9–10.3)
Creatinine, Ser: 1.01 mg/dL — ABNORMAL HIGH (ref 0.44–1.00)
Glucose, Bld: 91 mg/dL (ref 65–99)
POTASSIUM: 3.6 mmol/L (ref 3.5–5.1)
Sodium: 140 mmol/L (ref 135–145)
TOTAL PROTEIN: 6.7 g/dL (ref 6.5–8.1)

## 2015-04-14 MED ORDER — SODIUM CHLORIDE 0.9 % IV SOLN
INTRAVENOUS | Status: DC
  Administered 2015-04-14: 13:00:00 via INTRAVENOUS

## 2015-04-14 MED ORDER — SODIUM CHLORIDE 0.9 % IV SOLN
1000.0000 mg | INTRAVENOUS | Status: DC
  Administered 2015-04-14: 1000 mg via INTRAVENOUS
  Filled 2015-04-14: qty 40

## 2015-04-14 MED ORDER — ACETAMINOPHEN 325 MG PO TABS
650.0000 mg | ORAL_TABLET | ORAL | Status: DC
  Administered 2015-04-14: 650 mg via ORAL

## 2015-04-14 MED ORDER — DIPHENHYDRAMINE HCL 25 MG PO CAPS
ORAL_CAPSULE | ORAL | Status: AC
  Administered 2015-04-14: 25 mg via ORAL
  Filled 2015-04-14: qty 1

## 2015-04-14 MED ORDER — ACETAMINOPHEN 325 MG PO TABS
ORAL_TABLET | ORAL | Status: AC
  Administered 2015-04-14: 650 mg via ORAL
  Filled 2015-04-14: qty 2

## 2015-04-14 MED ORDER — DIPHENHYDRAMINE HCL 25 MG PO CAPS
25.0000 mg | ORAL_CAPSULE | ORAL | Status: DC
  Administered 2015-04-14: 25 mg via ORAL

## 2015-04-14 NOTE — Progress Notes (Signed)
    Daily Group Progress Note  Program: IOP  Group Time: 9:00-10:30  Participation Level: Active  Behavioral Response: Appropriate  Type of Therapy:  Group Therapy  Summary of Progress: Pt. Presented with bright affect, smiled appropriately, talkative, supportive of other group members and engaged in the group process. Pt. Continues to crochet in group to assist with grounding. Pt. Shared a moment when she felt challenged by the group when the group told her that she needed to set healthier boundaries with her husband. Pt. Reported that the confrontation was difficult to hear, but that it was necessary and helpful.      Group Time: 10:30-12:00  Participation Level:  None  Behavioral Response: Appropriate  Type of Therapy: Psycho-education Group  Summary of Progress: Pt. Was excused from grief and loss group at 10:40 due to previously scheduled appointment.  Shaune Pollack, LPC

## 2015-04-14 NOTE — Progress Notes (Signed)
    Daily Group Progress Note  Program: IOP  Group Time: 9:00-10:30  Participation Level: Active  Behavioral Response: Appropriate  Type of Therapy:  Psycho-education Group  Summary of Progress: Pt. Participated in medication management group with Michelle Nasuti.     Group Time: 10:30-12:00  Participation Level:  Active  Behavioral Response: Appropriate  Type of Therapy: Psycho-education Group  Summary of Progress: Pt. Continues to crochet and reports that it has been a pleasant leisure activity and helps with coping. Pt. Reports that she made decision to set personal boundary on contacting her husband and has not called or sent a text in 48 hours which is a significant achievement for her. Pt. Reports that she still desires a relationship and is hopeful that the family can spend Thanksgiving together, but she is now able to give him the space that he requested since the separation.   Shaune Pollack, LPC

## 2015-04-15 ENCOUNTER — Other Ambulatory Visit (HOSPITAL_COMMUNITY): Payer: Managed Care, Other (non HMO) | Admitting: Psychiatry

## 2015-04-15 DIAGNOSIS — F332 Major depressive disorder, recurrent severe without psychotic features: Secondary | ICD-10-CM | POA: Diagnosis not present

## 2015-04-15 DIAGNOSIS — F331 Major depressive disorder, recurrent, moderate: Secondary | ICD-10-CM

## 2015-04-15 NOTE — Progress Notes (Signed)
    Daily Group Progress Note  Program: IOP  Group Time: 9:00-10:30  Participation Level: Active  Behavioral Response: Appropriate  Type of Therapy:  Group Therapy  Summary of Progress: Pt. Presented well groomed. Pt. Reported that she was feeling lethargic and in pain which is usual for her following an infusion. Pt. Continues to express hopefulness about reuniting with her husband and children for the holidays.      Group Time: 10:30-12:00  Participation Level:  Active  Behavioral Response: Appropriate  Type of Therapy: Psycho-education Group  Summary of Progress: Pt. Participated in discussion about creating personal space and healthy boundaries following a relationship.  Shaune Pollack, LPC

## 2015-04-16 ENCOUNTER — Other Ambulatory Visit (HOSPITAL_COMMUNITY): Payer: Managed Care, Other (non HMO) | Admitting: Psychiatry

## 2015-04-16 ENCOUNTER — Ambulatory Visit: Payer: Self-pay | Admitting: Pulmonary Disease

## 2015-04-16 DIAGNOSIS — F331 Major depressive disorder, recurrent, moderate: Secondary | ICD-10-CM

## 2015-04-16 DIAGNOSIS — F332 Major depressive disorder, recurrent severe without psychotic features: Secondary | ICD-10-CM | POA: Diagnosis not present

## 2015-04-17 NOTE — Progress Notes (Signed)
    Daily Group Progress Note  Program: IOP  Group Time: 9:00-10:30  Participation Level: Active  Behavioral Response: Appropriate  Type of Therapy:  Group Therapy  Summary of Progress: Pt. Presented as talkative, makes good eye contact, engaged in the group process. Pt. Reports that she feels "yucky" referring to body pain. Pt. Reported that she was feeling anxious about wanting to move back home for Thanksgiving.     Group Time: 10:30-12:00  Participation Level:  Active  Behavioral Response: Appropriate  Type of Therapy: Psycho-education Group   Summary of Prgress: Pt. Watched and discussed Dewain Penning video about developing vulnerability for shame resilience and as necessary for healthy relationships.       Shaune Pollack, LPC

## 2015-04-21 ENCOUNTER — Other Ambulatory Visit (HOSPITAL_COMMUNITY): Payer: Managed Care, Other (non HMO) | Admitting: Psychiatry

## 2015-04-21 DIAGNOSIS — F331 Major depressive disorder, recurrent, moderate: Secondary | ICD-10-CM

## 2015-04-21 DIAGNOSIS — F332 Major depressive disorder, recurrent severe without psychotic features: Secondary | ICD-10-CM | POA: Diagnosis not present

## 2015-04-21 NOTE — Progress Notes (Signed)
    Daily Group Progress Note  Program: IOP  Group Time: 9:00-10:30  Participation Level: Active  Behavioral Response: Appropriate  Type of Therapy:  Psycho-education Group  Summary of Progress: Pt. Participated in medication management group with Michelle Nasuti.      Group Time: 10:30-12:00  Participation Level:  Active  Behavioral Response: Appropriate  Type of Therapy: Group Therapy  Summary of Progress: Pt. Presented in group as alert, engaged, uses crocheting to help with grounding. Pt. Reports that she was sick with respiratory infection last week, but feels much better. Pt. Discussed plans to meet with her husband at her therapist's office and is hopeful for a reconciliation for the holidays. Pt. Provided feedback for other group members regarding healing pain from relationships.   BH-PIOPB PSYCH

## 2015-04-22 ENCOUNTER — Other Ambulatory Visit (HOSPITAL_COMMUNITY): Payer: Managed Care, Other (non HMO) | Admitting: Psychiatry

## 2015-04-22 DIAGNOSIS — F332 Major depressive disorder, recurrent severe without psychotic features: Secondary | ICD-10-CM | POA: Diagnosis not present

## 2015-04-22 DIAGNOSIS — F331 Major depressive disorder, recurrent, moderate: Secondary | ICD-10-CM

## 2015-04-22 MED ORDER — ARIPIPRAZOLE 2 MG PO TABS: 2.0000 mg | ORAL_TABLET | Freq: Every day | ORAL | Status: DC

## 2015-04-22 MED ORDER — GABAPENTIN 300 MG PO CAPS: 300.0000 mg | ORAL_CAPSULE | Freq: Three times a day (TID) | ORAL | Status: DC

## 2015-04-22 NOTE — Progress Notes (Signed)
[name] [name] is a 49 y.o. ,separated, disabled, African American female, who was referred per psychiatrist (Dr. Evelene Croon); treatment for worsening depressive symptoms. Symptoms included: Anhedonia, low energy, no motivation, sadness, tearfulness, feelings of guilt, hopelessness and helplessness, irritability, ruminating thoughts and binge eating (has lost 10 lbs within one month). Denied SI/HI, A/V hallucinations, paranoia, and PTSD symptoms. Stressors: 1) Health Issues: Reports being diagnosed with Rheumatoid Arthritis on her 28th birthday. "I've been going down since then." Pt also struggles with Fibromyalgia among other health issues. 2) Marriage of seventeen years. Due to decompensation in her health; pt left the marriage in January 2016. Pt currently resides with a friend. Stated although husband is supportive, she became so overwhelmed with guilt because she couldn't function like she use to.  Pt completed MH-IOP today.  States she learned a lot of coping skills; able to practice self-care skills.  According to pt; the groups were helpful.  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  Reports she continues to struggle with pain.  Denies SI/HI or A/V hallucinations.  A:  D/C today.  F/U with Dr. Evelene Croon on 05-13-15 @ 1:30 pm and Kaitlyn Hecox, LMFT on 04-28-15 @ 7 pm.  Encouraged support groups.  R:  Pt receptive.   Chestine Spore, RITA, M.Ed, CNA

## 2015-04-22 NOTE — Progress Notes (Signed)
    Daily Group Progress Note  Program: IOP  Group Time: 9:00-10:30  Participation Level: Active  Behavioral Response: Appropriate  Type of Therapy:  Group Therapy  Summary of Progress: Pt. Presented with sad affect. Pt. Was alert, engaged in group process. Pt. Participated in discussion with [place] about discharge planning.      Group Time: 10:30-12:00  Participation Level:  Active  Behavioral Response: Appropriate  Type of Therapy: Psycho-education Group  Summary of Progress: Pt. Continued to present with sad affect. Pt. Reported readiness to discharge but sadness related to leaving the group. Pt. Also reported disappointment and sadness related to not being able to move back into her home with her husband and children for Thanksgiving. Pt. Discussed meeting with her therapist yesterday and deciding that it would be best for at this time to plant to have Thanksgiving with her family and plan to continue working with her husband on the relationship and reconciling.   Shaune Pollack, LPC

## 2015-04-22 NOTE — Progress Notes (Signed)
Patient ID: [name] [name], female   DOB: 08-Nov-1965, 49 y.o.   MRN: [id number] Discharge Note  Patient:  [name] [name] is an 49 y.o., female DOB:  02-Nov-1965  Date of Admission:  03/26/2015  Date of Discharge:  04/22/2015  Reason for Admission:depression and anxiety  IOP Course:  Ms Dedic attended and participated.  Still fairly depressed but some better she says.  Trying to learn to love herself and take care of herself rather than everybody else and hope she feels happier.  Accepts she will remain separated, plans to spend Thanksgiving with her husband and children and not get too depressed when she has to leave.  Applying some of the coping skills she has learned.  Mental Status at Discharge:depressed but not suicidal, anxiety decreased  Lab Results: No results found for this or any previous visit (from the past 48 hour(s)).   Current outpatient prescriptions:  .  Adapalene 0.3 % gel, , Disp: , Rfl:  .  ARIPiprazole (ABILIFY) 2 MG tablet, Take 1 tablet (2 mg total) by mouth daily., Disp: 30 tablet, Rfl: 0 .  atorvastatin (LIPITOR) 80 MG tablet, Take 1 tablet (80 mg total) by mouth daily at 6 PM., Disp: 30 tablet, Rfl: 11 .  buPROPion (WELLBUTRIN XL) 300 MG 24 hr tablet, Take 1 tablet (300 mg total) by mouth daily., Disp: 30 tablet, Rfl: 0 .  CARAFATE 1 GM/10ML suspension, TAKE 10 MLS (1 G TOTAL) BY MOUTH 4 (FOUR) TIMES DAILY - WITH MEALS AND AT BEDTIME., Disp: , Rfl: 0 .  cyclobenzaprine (FLEXERIL) 10 MG tablet, TAKE 1 TABLET BY MOUTH EVERY 6-8 HOURS AS NEEDED FOR MUSCLE SPASMS, Disp: , Rfl: 0 .  diclofenac sodium (VOLTAREN) 1 % GEL, , Disp: , Rfl:  .  diltiazem (CARTIA XT) 180 MG 24 hr capsule, Take 1 capsule (180 mg total) by mouth daily., Disp: 90 capsule, Rfl: 1 .  doxepin (SINEQUAN) 10 MG capsule, Take 1 capsule (10 mg total) by mouth at bedtime., Disp: 30 capsule, Rfl: 0 .  DULoxetine (CYMBALTA) 30 MG capsule, Take 30 mg by mouth 2 (two) times daily. , Disp: , Rfl:   .  EPIPEN 2-PAK 0.3 MG/0.3ML SOAJ injection, Inject 0.3 mg into the skin once. , Disp: , Rfl:  .  famciclovir (FAMVIR) 500 MG tablet, TAKE 1 TAB BY MOUTH DAILY FOR PREVENTION OR 3 TABS AT ONSET OF FEVER BLISTER X 1 DAY, Disp: , Rfl: 11 .  FLUoxetine (PROZAC) 10 MG capsule, Take 10 mg by mouth daily., Disp: , Rfl:  .  fluticasone (FLONASE) 50 MCG/ACT nasal spray, Place 2 sprays into both nostrils as needed for allergies., Disp: 16 g, Rfl: 11 .  gabapentin (NEURONTIN) 300 MG capsule, Take 1 capsule (300 mg total) by mouth 3 (three) times daily., Disp: 90 capsule, Rfl: 0 .  hydrOXYzine (VISTARIL) 25 MG capsule, , Disp: , Rfl:  .  levothyroxine (SYNTHROID, LEVOTHROID) 25 MCG tablet, Take 1 tablet (25 mcg total) by mouth daily before breakfast., Disp: 30 tablet, Rfl: 0 .  OTREXUP 25 MG/0.4ML SOAJ, , Disp: , Rfl:  .  pantoprazole (PROTONIX) 40 MG tablet, Take 40 mg by mouth daily., Disp: , Rfl:  .  pantoprazole (PROTONIX) 40 MG tablet, TAKE 1 TABLET (40 MG TOTAL) BY MOUTH 2 (TWO) TIMES DAILY., Disp: 60 tablet, Rfl: 6 .  tapentadol (NUCYNTA) 50 MG TABS tablet, Take 50 mg by mouth., Disp: , Rfl:  .  TL GARD RX 2.2-25-1 MG TABS, , Disp: ,  Rfl:  .  Topiramate ER 50 MG CP24, Take 50 mg by mouth daily as needed (migraines). Trokendi XR, Disp: 30 capsule, Rfl:  .  Vitamin D, Ergocalciferol, (DRISDOL) 50000 UNITS CAPS capsule, Take 500,000 Units by mouth 3 (three) times a week. Mon, wed, fri, Disp: , Rfl:  .  XERESE 5-1 % CREA, , Disp: , Rfl:   Axis Diagnosis:  Major depression, recurrent, severe without psychosis   Level of Care:  IOP  Discharge destination:  Other:  has appointments with her psychiatrist and therapist  Is patient on multiple antipsychotic therapies at discharge:  No    Has Patient had three or more failed trials of antipsychotic monotherapy by history:  Negative  Patient phone:  [phone] (home)  Patient address:   [address] [place] [address],   Follow-up  recommendations:  Activity:  continue current activity Diet:  continue current diet  Comments:  none  The patient received suicide prevention pamphlet:  Yes Belongings returned:  na  Carolanne Grumbling 04/22/2015, 9:37 AM

## 2015-04-22 NOTE — Patient Instructions (Addendum)
Patient completed MH-IOP today.  Will follow up with Dr. Evelene Croon on 05-13-15 @ 1:30 pm and Kaitlyn Hecox, LMFT on 04-28-15 @ 7 pm.  Encouraged support groups.

## 2015-04-23 ENCOUNTER — Ambulatory Visit (HOSPITAL_COMMUNITY): Payer: Self-pay

## 2015-04-24 ENCOUNTER — Ambulatory Visit (HOSPITAL_COMMUNITY): Payer: Self-pay

## 2015-04-25 ENCOUNTER — Ambulatory Visit (HOSPITAL_COMMUNITY): Payer: Self-pay

## 2015-04-28 ENCOUNTER — Emergency Department (HOSPITAL_COMMUNITY)
Admission: EM | Admit: 2015-04-28 | Discharge: 2015-04-28 | Disposition: A | Discharge status: Home / Self Care | Payer: Managed Care, Other (non HMO) | Attending: Physician Assistant | Admitting: Physician Assistant

## 2015-04-28 ENCOUNTER — Ambulatory Visit (HOSPITAL_COMMUNITY): Payer: Self-pay

## 2015-04-28 ENCOUNTER — Encounter (HOSPITAL_COMMUNITY): Payer: Self-pay | Admitting: Emergency Medicine

## 2015-04-28 DIAGNOSIS — Z79899 Other long term (current) drug therapy: Secondary | ICD-10-CM | POA: Insufficient documentation

## 2015-04-28 DIAGNOSIS — Z8719 Personal history of other diseases of the digestive system: Secondary | ICD-10-CM | POA: Insufficient documentation

## 2015-04-28 DIAGNOSIS — Z9889 Other specified postprocedural states: Secondary | ICD-10-CM | POA: Insufficient documentation

## 2015-04-28 DIAGNOSIS — F329 Major depressive disorder, single episode, unspecified: Secondary | ICD-10-CM | POA: Diagnosis not present

## 2015-04-28 DIAGNOSIS — M069 Rheumatoid arthritis, unspecified: Secondary | ICD-10-CM | POA: Diagnosis not present

## 2015-04-28 DIAGNOSIS — E785 Hyperlipidemia, unspecified: Secondary | ICD-10-CM | POA: Insufficient documentation

## 2015-04-28 DIAGNOSIS — R51 Headache: Secondary | ICD-10-CM | POA: Diagnosis present

## 2015-04-28 DIAGNOSIS — Z7951 Long term (current) use of inhaled steroids: Secondary | ICD-10-CM | POA: Insufficient documentation

## 2015-04-28 DIAGNOSIS — R253 Fasciculation: Secondary | ICD-10-CM | POA: Insufficient documentation

## 2015-04-28 DIAGNOSIS — Z8679 Personal history of other diseases of the circulatory system: Secondary | ICD-10-CM | POA: Insufficient documentation

## 2015-04-28 DIAGNOSIS — G514 Facial myokymia: Secondary | ICD-10-CM

## 2015-04-28 DIAGNOSIS — Z8619 Personal history of other infectious and parasitic diseases: Secondary | ICD-10-CM | POA: Insufficient documentation

## 2015-04-28 DIAGNOSIS — F419 Anxiety disorder, unspecified: Secondary | ICD-10-CM | POA: Diagnosis not present

## 2015-04-28 LAB — CBC WITH DIFFERENTIAL/PLATELET
Basophils Absolute: 0 10*3/uL (ref 0.0–0.1)
Basophils Relative: 1 %
EOS ABS: 0.2 10*3/uL (ref 0.0–0.7)
Eosinophils Relative: 3 %
HEMATOCRIT: 43 % (ref 36.0–46.0)
HEMOGLOBIN: 13.8 g/dL (ref 12.0–15.0)
LYMPHS ABS: 3.3 10*3/uL (ref 0.7–4.0)
Lymphocytes Relative: 46 %
MCH: 27.1 pg (ref 26.0–34.0)
MCHC: 32.1 g/dL (ref 30.0–36.0)
MCV: 84.5 fL (ref 78.0–100.0)
Monocytes Absolute: 0.5 10*3/uL (ref 0.1–1.0)
Monocytes Relative: 7 %
NEUTROS ABS: 3 10*3/uL (ref 1.7–7.7)
NEUTROS PCT: 43 %
Platelets: 265 10*3/uL (ref 150–400)
RBC: 5.09 MIL/uL (ref 3.87–5.11)
RDW: 12.5 % (ref 11.5–15.5)
WBC: 7 10*3/uL (ref 4.0–10.5)

## 2015-04-28 LAB — COMPREHENSIVE METABOLIC PANEL
ALT: 20 U/L (ref 14–54)
ANION GAP: 7 (ref 5–15)
AST: 24 U/L (ref 15–41)
Albumin: 3.7 g/dL (ref 3.5–5.0)
Alkaline Phosphatase: 77 U/L (ref 38–126)
BUN: 12 mg/dL (ref 6–20)
CHLORIDE: 108 mmol/L (ref 101–111)
CO2: 28 mmol/L (ref 22–32)
CREATININE: 1.06 mg/dL — AB (ref 0.44–1.00)
Calcium: 9.2 mg/dL (ref 8.9–10.3)
Glucose, Bld: 93 mg/dL (ref 65–99)
POTASSIUM: 3.6 mmol/L (ref 3.5–5.1)
SODIUM: 143 mmol/L (ref 135–145)
Total Bilirubin: 0.5 mg/dL (ref 0.3–1.2)
Total Protein: 7 g/dL (ref 6.5–8.1)

## 2015-04-28 LAB — PHOSPHORUS: PHOSPHORUS: 3.1 mg/dL (ref 2.5–4.6)

## 2015-04-28 LAB — MAGNESIUM: MAGNESIUM: 2 mg/dL (ref 1.7–2.4)

## 2015-04-28 MED ORDER — IBUPROFEN 800 MG PO TABS
800.0000 mg | ORAL_TABLET | Freq: Once | ORAL | Status: AC
  Administered 2015-04-28: 800 mg via ORAL
  Filled 2015-04-28: qty 1

## 2015-04-28 NOTE — ED Notes (Signed)
Pt requested IV instead of having blood drawn with straight stick. Pt sts "I'm a hard stick and I don't want to have to be stuck again."

## 2015-04-28 NOTE — Discharge Instructions (Signed)
You were seen today with facial twitching. Please follow-up with neurologist as needed.

## 2015-04-28 NOTE — ED Provider Notes (Signed)
CSN: [id number]     Arrival date & time 04/28/15  0920 History   First MD Initiated Contact with Patient 04/28/15 [phone]     Chief Complaint  Patient presents with  . Headache     (Consider location/radiation/quality/duration/timing/severity/associated sxs/prior Treatment) HPI    Patient is a 49 year old female with history of depression and anxiety, rheumatoid arthritis, fibromyalgia, CAD presenting with isolated left lower lip twitching. It started this morning. She has mild headache. She never had this before. She denies any nausea vomiting diarrhea. Denies any recent URI. Denies any weakness.  She does report to be on several medications including Wellbutrin, Flexeril, Cymbalta, Prozac, gabapentin, Abilify.  Past Medical History  Diagnosis Date  . Rheumatoid arthritis(714.0)   . Depression   . Morbid obesity (HCC)   . HSV-1 infection   . Diastolic dysfunction   . Fibromyalgia   . Hiatal hernia   . Esophageal ring   . Dyslipidemia 01/30/2015  . Coronary artery calcification seen on CAT scan     minimal CAD with 30% prox and mild LAD  . Anxiety    Past Surgical History  Procedure Laterality Date  . Cesarean section    . Tubal ligation    . Laparoscopic gastric sleeve resection  11/21/12    [facility]  . Left heart catheterization with coronary angiogram N/A 08/26/2014    Procedure: LEFT HEART CATHETERIZATION WITH CORONARY ANGIOGRAM;  Surgeon: Runell Gess, MD;  Location: [facility] CATH LAB;  Service: Cardiovascular;  Laterality: N/A;  . Esophageal manometry N/A 10/28/2014    Procedure: ESOPHAGEAL MANOMETRY (EM);  Surgeon: Hilarie Fredrickson, MD;  Location: [facility] ENDOSCOPY;  Service: Endoscopy;  Laterality: N/A;  . Hernia repair      reports surgery on 3 hernias, with 2 more present   Family History  Problem Relation Age of Onset  . Hyperlipidemia Mother   . Depression Mother   . Sarcoidosis Father   . Lung disease Father     Pleural Mesothelioma  . Cancer Father   . Heart  attack Paternal Grandmother   . Hypertension Paternal Grandmother   . Stroke Neg Hx   . Colon cancer Neg Hx   . Esophageal cancer Neg Hx   . Rectal cancer Neg Hx   . Stomach cancer Neg Hx   . Hypertension Maternal Grandmother   . Diabetes Maternal Grandmother    Social History  Substance Use Topics  . Smoking status: Never Smoker   . Smokeless tobacco: Never Used  . Alcohol Use: No   OB History    No data available     Review of Systems  Constitutional: Negative for activity change.  Respiratory: Negative for shortness of breath.   Cardiovascular: Negative for chest pain.  Gastrointestinal: Negative for abdominal pain.  Neurological: Negative for dizziness.  Psychiatric/Behavioral: Negative for agitation.      Allergies  Flu virus vaccine and Influenza vaccines  Home Medications   Prior to Admission medications   Medication Sig Start Date End Date Taking? Authorizing Provider  Adapalene 0.3 % gel Apply 1 application topically daily as needed (acne).  12/31/14  Yes Historical Provider, MD  ARIPiprazole (ABILIFY) 2 MG tablet Take 1 tablet (2 mg total) by mouth daily. 04/22/15  Yes Benjaman Pott, MD  atorvastatin (LIPITOR) 80 MG tablet Take 1 tablet (80 mg total) by mouth daily at 6 PM. 12/28/14  Yes Adonis Brook, NP  Betamethasone Valerate 0.12 % foam Apply 1 application topically daily as needed. Itching 04/22/15  Yes Historical Provider, MD  buPROPion (WELLBUTRIN XL) 300 MG 24 hr tablet Take 1 tablet (300 mg total) by mouth daily. 12/28/14  Yes Adonis Brook, NP  CARAFATE 1 GM/10ML suspension TAKE 10 MLS (1 G TOTAL) BY MOUTH 4 (FOUR) TIMES DAILY - WITH MEALS AND AT BEDTIME. 11/18/14  Yes Historical Provider, MD  clindamycin-benzoyl peroxide (BENZACLIN) gel Apply 1 application topically daily as needed. Acne 02/13/15  Yes Historical Provider, MD  clobetasol cream (TEMOVATE) 0.05 % Apply 1 application topically daily as needed. Skin discoloration 04/22/15  Yes Historical  Provider, MD  cyclobenzaprine (FLEXERIL) 10 MG tablet TAKE 1 TABLET BY MOUTH EVERY 6-8 HOURS AS NEEDED FOR MUSCLE SPASMS 01/22/15  Yes Historical Provider, MD  diclofenac sodium (VOLTAREN) 1 % GEL Apply 2 g topically 2 (two) times daily as needed (pain).  01/28/15  Yes Historical Provider, MD  diltiazem (CARTIA XT) 180 MG 24 hr capsule Take 1 capsule (180 mg total) by mouth daily. 01/01/15  Yes Adonis Brook, NP  doxepin (SINEQUAN) 10 MG capsule Take 1 capsule (10 mg total) by mouth at bedtime. 12/28/14  Yes Adonis Brook, NP  DULoxetine (CYMBALTA) 30 MG capsule Take 30 mg by mouth 2 (two) times daily.  12/30/14  Yes Historical Provider, MD  EPIPEN 2-PAK 0.3 MG/0.3ML SOAJ injection Inject 0.3 mg into the skin once.  12/30/14  Yes Historical Provider, MD  famciclovir (FAMVIR) 500 MG tablet TAKE 1 TAB BY MOUTH DAILY FOR PREVENTION OR 3 TABS AT ONSET OF FEVER BLISTER X 1 DAY 01/21/15  Yes Historical Provider, MD  FLUoxetine (PROZAC) 10 MG capsule Take 10 mg by mouth daily.   Yes Historical Provider, MD  fluticasone (FLONASE) 50 MCG/ACT nasal spray Place 2 sprays into both nostrils as needed for allergies. 12/28/14 12/29/15 Yes Adonis Brook, NP  gabapentin (NEURONTIN) 300 MG capsule Take 1 capsule (300 mg total) by mouth 3 (three) times daily. 04/22/15  Yes Benjaman Pott, MD  hydrOXYzine (VISTARIL) 25 MG capsule Take 25 mg by mouth every 8 (eight) hours as needed for anxiety.  12/30/14  Yes Historical Provider, MD  levothyroxine (SYNTHROID, LEVOTHROID) 25 MCG tablet Take 1 tablet (25 mcg total) by mouth daily before breakfast. 12/28/14  Yes Adonis Brook, NP  lidocaine (LIDODERM) 5 % Place 1 patch onto the skin daily as needed. 03/29/15  Yes Historical Provider, MD  neomycin-polymyxin-hydrocortisone (CORTISPORIN) 3.5-10000-1 otic suspension Place 1-2 drops into the left ear daily as needed. 04/09/15  Yes Historical Provider, MD  OTREXUP 25 MG/0.4ML SOAJ Inject 25 mg into the skin once a week. Tuesday 01/28/15   Yes Historical Provider, MD  pantoprazole (PROTONIX) 40 MG tablet TAKE 1 TABLET (40 MG TOTAL) BY MOUTH 2 (TWO) TIMES DAILY. 03/13/15  Yes Amy S Esterwood, PA-C  tapentadol (NUCYNTA) 50 MG TABS tablet Take 50 mg by mouth every 6 (six) hours as needed for moderate pain.    Yes Historical Provider, MD  TL GARD RX 2.2-25-1 MG TABS Take 1 tablet by mouth daily.  01/28/15  Yes Historical Provider, MD  Topiramate ER 50 MG CP24 Take 50 mg by mouth daily as needed (migraines). Trokendi XR 12/28/14  Yes Adonis Brook, NP  tretinoin (RETIN-A) 0.025 % cream Apply 1 application topically daily. 02/11/15  Yes Historical Provider, MD  triamcinolone cream (KENALOG) 0.1 % Apply 1 application topically daily as needed. Itching 04/13/15  Yes Historical Provider, MD  Vitamin D, Ergocalciferol, (DRISDOL) 50000 UNITS CAPS capsule Take 500,000 Units by mouth 3 (three) times a week.  Paulo Fruit, Caleen Essex 01/21/15 01/22/16 Yes Historical Provider, MD  XERESE 5-1 % CREA Apply 1 application topically daily as needed (fever blisters).  12/30/14  Yes Historical Provider, MD   BP 117/74 mmHg  Pulse 62  Temp(Src) 97.7 F (36.5 C) (Oral)  Resp 18  SpO2 100% Physical Exam  Constitutional: She is oriented to person, place, and time. She appears well-developed and well-nourished.  HENT:  Head: Normocephalic and atraumatic.  Eyes: Conjunctivae are normal. Right eye exhibits no discharge.  Neck: Neck supple.  Cardiovascular: Normal rate, regular rhythm and normal heart sounds.   No murmur heard. Pulmonary/Chest: Effort normal and breath sounds normal. She has no wheezes. She has no rales.  Abdominal: Soft. She exhibits no distension. There is no tenderness.  Musculoskeletal: Normal range of motion. She exhibits no edema.  Neurological: She is oriented to person, place, and time. No cranial nerve deficit.  Patient has twitching of her left lower lip. It is visible on exam.  Skin: Skin is warm and dry. No rash noted. She is not diaphoretic.   Psychiatric: She has a normal mood and affect. Her behavior is normal.  Nursing note and vitals reviewed.   ED Course  Procedures (including critical care time) Labs Review Labs Reviewed  COMPREHENSIVE METABOLIC PANEL - Abnormal; Notable for the following:    Creatinine, Ser 1.06 (*)    All other components within normal limits  CBC WITH DIFFERENTIAL/PLATELET  MAGNESIUM  PHOSPHORUS    Imaging Review No results found. I have personally reviewed and evaluated these images and lab results as part of my medical decision-making.   EKG Interpretation None      MDM   Final diagnoses:  None    Patient is a 48 year old female presenting with isolated left lower lip twitching. This started this morning. Patient's noted no other changes in her neurologic status. No recent illness.  And mildly concerned this could be an early tardive dyskinesia versus very focal seizure. Versus side effect of her psychiatric medications. We'll touch base with neurology.  12:59 PM  Discussed with neurology. They would not change any of her medications this time. They think it could be a tick. They recommend follow-up with neurology as needed in the future. They would not do any imaging today.  Patient understands return precautions.  Sera Hitsman Randall An, MD 04/28/15 1259

## 2015-04-28 NOTE — ED Notes (Signed)
Patient here with complaints of headache. Also states that her mouth twitches when smiling. Nausea, no vomiting.

## 2015-04-29 ENCOUNTER — Ambulatory Visit (HOSPITAL_COMMUNITY): Payer: Self-pay

## 2015-04-30 ENCOUNTER — Ambulatory Visit (HOSPITAL_COMMUNITY): Payer: Self-pay

## 2015-05-12 ENCOUNTER — Encounter (HOSPITAL_COMMUNITY)
Admission: RE | Admit: 2015-05-12 | Discharge: 2015-05-12 | Disposition: A | Discharge status: Home / Self Care | Payer: Managed Care, Other (non HMO) | Source: Ambulatory Visit | Attending: Rheumatology | Admitting: Rheumatology

## 2015-05-12 DIAGNOSIS — M069 Rheumatoid arthritis, unspecified: Secondary | ICD-10-CM | POA: Diagnosis not present

## 2015-05-12 MED ORDER — SODIUM CHLORIDE 0.9 % IV SOLN
1000.0000 mg | INTRAVENOUS | Status: DC
  Administered 2015-05-12: 1000 mg via INTRAVENOUS
  Filled 2015-05-12: qty 40

## 2015-05-12 MED ORDER — SODIUM CHLORIDE 0.9 % IV SOLN: INTRAVENOUS | Status: DC

## 2015-05-12 MED ORDER — DIPHENHYDRAMINE HCL 25 MG PO CAPS
25.0000 mg | ORAL_CAPSULE | ORAL | Status: DC
  Administered 2015-05-12: 25 mg via ORAL

## 2015-05-12 MED ORDER — ACETAMINOPHEN 325 MG PO TABS
650.0000 mg | ORAL_TABLET | ORAL | Status: DC
  Administered 2015-05-12: 650 mg via ORAL

## 2015-05-12 MED ORDER — ACETAMINOPHEN 325 MG PO TABS
ORAL_TABLET | ORAL | Status: AC
Start: 2015-05-12 — End: 2015-05-13
  Filled 2015-05-12: qty 2

## 2015-05-12 MED ORDER — DIPHENHYDRAMINE HCL 25 MG PO CAPS
ORAL_CAPSULE | ORAL | Status: AC
  Filled 2015-05-12: qty 1

## 2015-06-10 ENCOUNTER — Ambulatory Visit (HOSPITAL_COMMUNITY): Payer: Managed Care, Other (non HMO)

## 2015-06-12 ENCOUNTER — Ambulatory Visit (HOSPITAL_COMMUNITY)
Admission: RE | Admit: 2015-06-12 | Discharge: 2015-06-12 | Disposition: A | Discharge status: Home / Self Care | Payer: Managed Care, Other (non HMO) | Source: Ambulatory Visit | Attending: Rheumatology | Admitting: Rheumatology

## 2015-06-12 DIAGNOSIS — M069 Rheumatoid arthritis, unspecified: Secondary | ICD-10-CM | POA: Diagnosis present

## 2015-06-12 LAB — COMPREHENSIVE METABOLIC PANEL
ALBUMIN: 3.3 g/dL — AB (ref 3.5–5.0)
ALT: 17 U/L (ref 14–54)
ANION GAP: 7 (ref 5–15)
AST: 33 U/L (ref 15–41)
Alkaline Phosphatase: 68 U/L (ref 38–126)
BILIRUBIN TOTAL: 0.2 mg/dL — AB (ref 0.3–1.2)
BUN: 8 mg/dL (ref 6–20)
CHLORIDE: 110 mmol/L (ref 101–111)
CO2: 25 mmol/L (ref 22–32)
CREATININE: 0.98 mg/dL (ref 0.44–1.00)
Calcium: 8.9 mg/dL (ref 8.9–10.3)
GFR calc Af Amer: >60 mL/min (ref >60)
GFR calc non Af Amer: >60 mL/min (ref >60)
GLUCOSE: 96 mg/dL (ref 65–99)
POTASSIUM: 3.9 mmol/L (ref 3.5–5.1)
SODIUM: 142 mmol/L (ref 135–145)
Total Protein: 6.5 g/dL (ref 6.5–8.1)

## 2015-06-12 LAB — CBC
HCT: 41.9 % (ref 36.0–46.0)
HEMOGLOBIN: 13.1 g/dL (ref 12.0–15.0)
MCH: 26.8 pg (ref 26.0–34.0)
MCHC: 31.3 g/dL (ref 30.0–36.0)
MCV: 85.7 fL (ref 78.0–100.0)
PLATELETS: 257 10*3/uL (ref 150–400)
RBC: 4.89 MIL/uL (ref 3.87–5.11)
RDW: 13 % (ref 11.5–15.5)
WBC: 8 10*3/uL (ref 4.0–10.5)

## 2015-06-12 LAB — DIFFERENTIAL
BASOS ABS: 0.1 10*3/uL (ref 0.0–0.1)
Basophils Relative: 1 %
EOS ABS: 0.2 10*3/uL (ref 0.0–0.7)
EOS PCT: 2 %
LYMPHS ABS: 3 10*3/uL (ref 0.7–4.0)
LYMPHS PCT: 38 %
Monocytes Absolute: 0.4 10*3/uL (ref 0.1–1.0)
Monocytes Relative: 5 %
NEUTROS PCT: 54 %
Neutro Abs: 4.3 10*3/uL (ref 1.7–7.7)

## 2015-06-12 MED ORDER — SODIUM CHLORIDE 0.9 % IV SOLN
INTRAVENOUS | Status: DC
  Administered 2015-06-12: 09:00:00 via INTRAVENOUS

## 2015-06-12 MED ORDER — ACETAMINOPHEN 325 MG PO TABS
650.0000 mg | ORAL_TABLET | ORAL | Status: DC
  Administered 2015-06-12: 650 mg via ORAL

## 2015-06-12 MED ORDER — DIPHENHYDRAMINE HCL 25 MG PO CAPS
25.0000 mg | ORAL_CAPSULE | ORAL | Status: DC
  Administered 2015-06-12: 25 mg via ORAL

## 2015-06-12 MED ORDER — ACETAMINOPHEN 325 MG PO TABS
ORAL_TABLET | ORAL | Status: AC
  Filled 2015-06-12: qty 2

## 2015-06-12 MED ORDER — SODIUM CHLORIDE 0.9 % IV SOLN
1000.0000 mg | INTRAVENOUS | Status: DC
  Administered 2015-06-12: 1000 mg via INTRAVENOUS
  Filled 2015-06-12: qty 40

## 2015-06-12 MED ORDER — DIPHENHYDRAMINE HCL 25 MG PO CAPS
ORAL_CAPSULE | ORAL | Status: AC
  Filled 2015-06-12: qty 1

## 2015-06-27 IMAGING — RF DG ESOPHAGUS
14 of 22 series · 14 of 24 positions shown · non-contrast
Comparison: None.

CLINICAL DATA: Dysphagia and chest pain.

EXAM:
ESOPHOGRAM/BARIUM SWALLOW
TECHNIQUE: Combined double contrast and single contrast examination performed
using effervescent crystals, thick barium liquid, and thin barium
liquid.
FLUOROSCOPY TIME:  Radiation Exposure Index (as provided by the
fluoroscopic device):
If the device does not provide the exposure index:
Fluoroscopy Time:  1 minutes and 49 seconds
Number of Acquired Images:

[Series 1: run · 1 of 13 slices shown (1 of 14)]
[im 1/13]
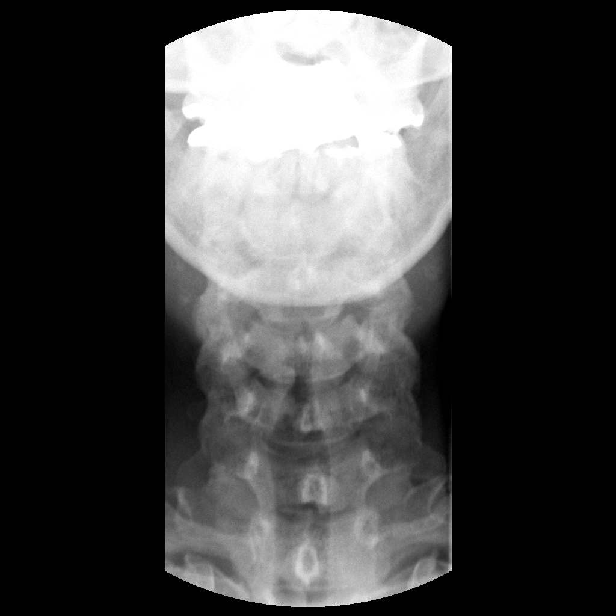

[Series 2: run · 1 of 12 slices shown (2 of 14)]
[im 1/12]
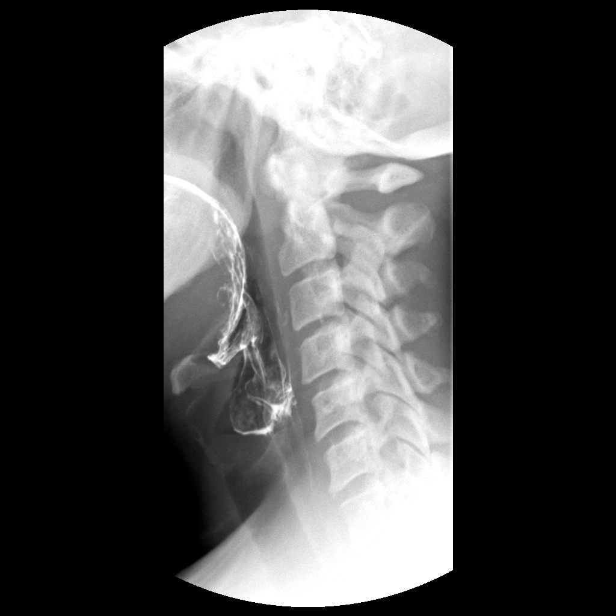

[Series 3: run · 1 of 1 slices shown (3 of 14)]
[im 1/1]
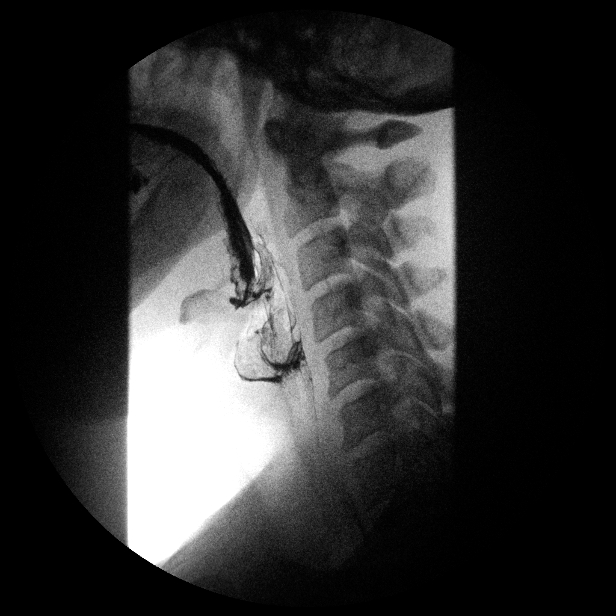

[Series 5: run · 1 of 3 slices shown (4 of 14)]
[im 1/3]
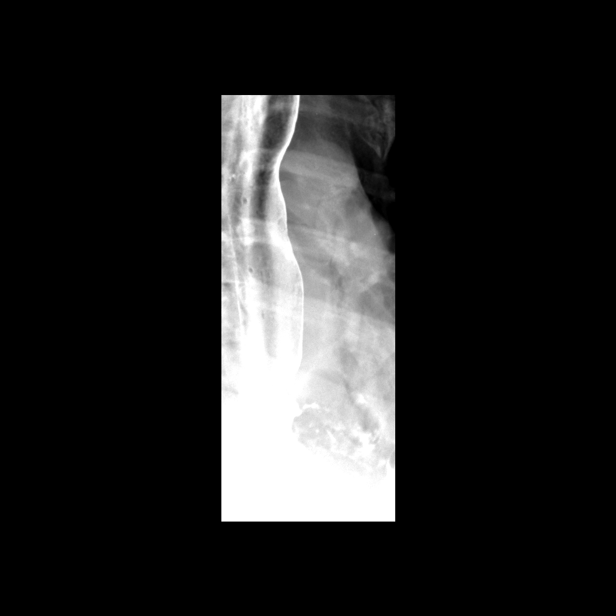

[Series 6: run · 1 of 3 slices shown (5 of 14)]
[im 1/3]
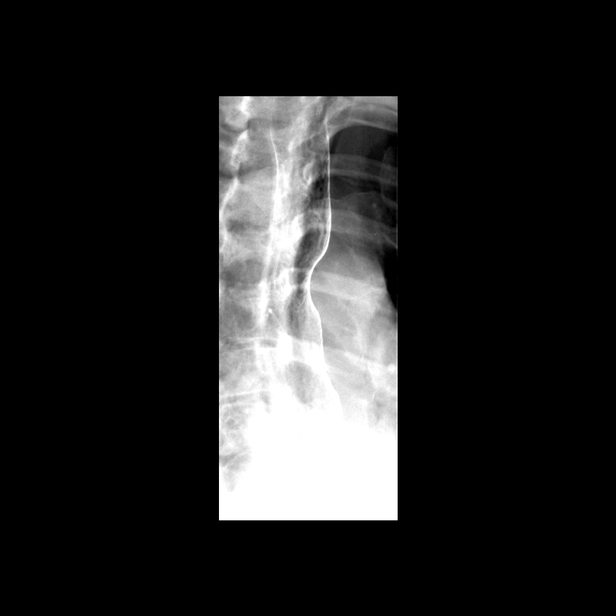

[Series 8: run · 1 of 1 slices shown (6 of 14)]
[im 1/1]
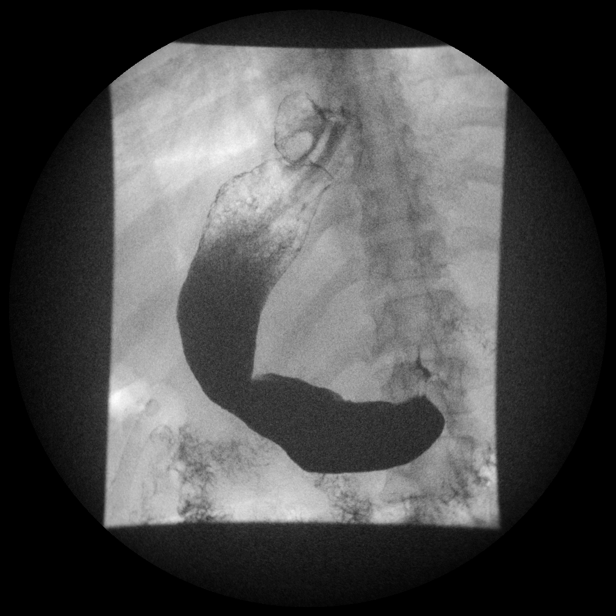

[Series 10: run · 1 of 1 slices shown (7 of 14)]
[im 1/1]
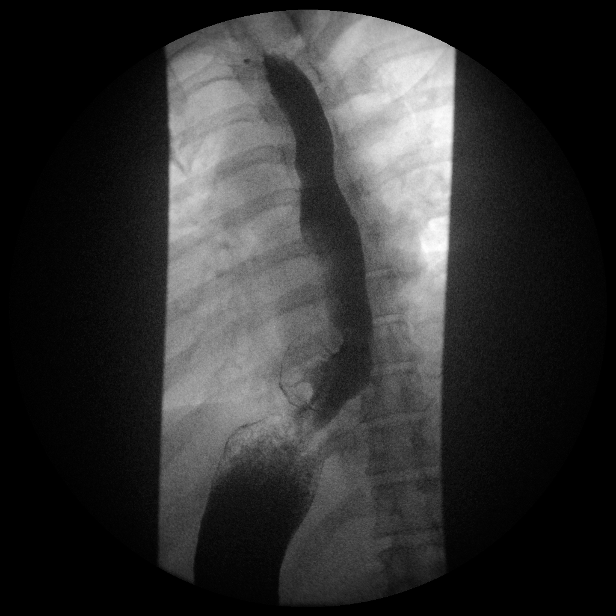

[Series 11: run · 1 of 1 slices shown (8 of 14)]
[im 1/1]
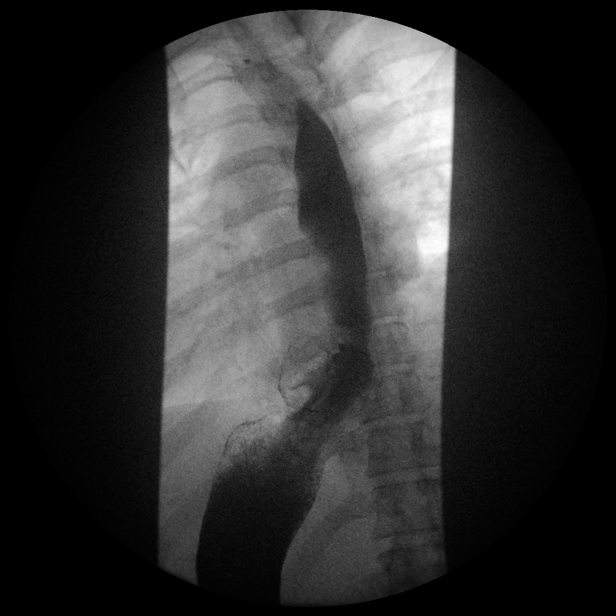

[Series 13: run · 1 of 1 slices shown (9 of 14)]
[im 1/1]
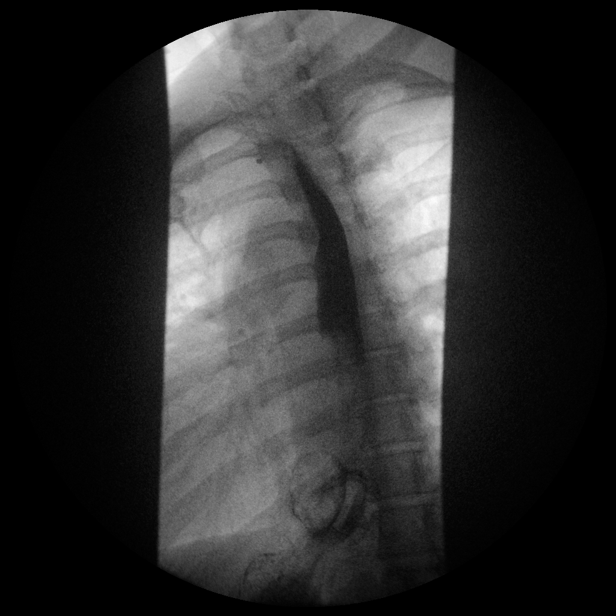

[Series 15: run · 1 of 1 slices shown (10 of 14)]
[im 1/1]
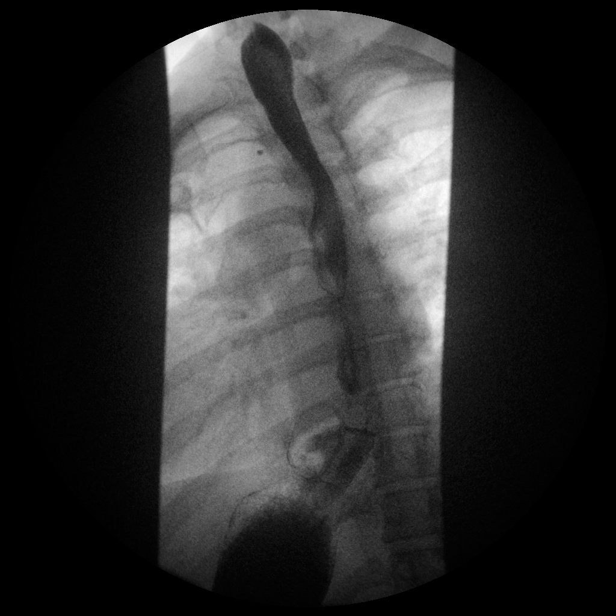

[Series 17: run · 1 of 1 slices shown (11 of 14)]
[im 1/1]
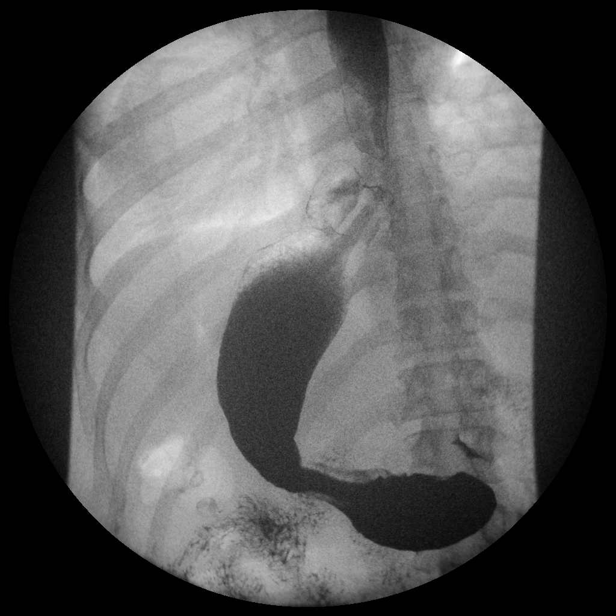

[Series 18: run · 1 of 1 slices shown (12 of 14)]
[im 1/1]
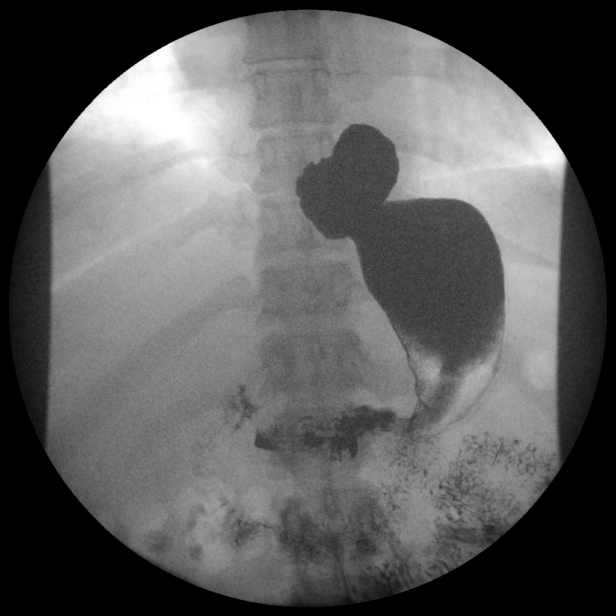

[Series 20: run · 1 of 1 slices shown (13 of 14)]
[im 1/1]
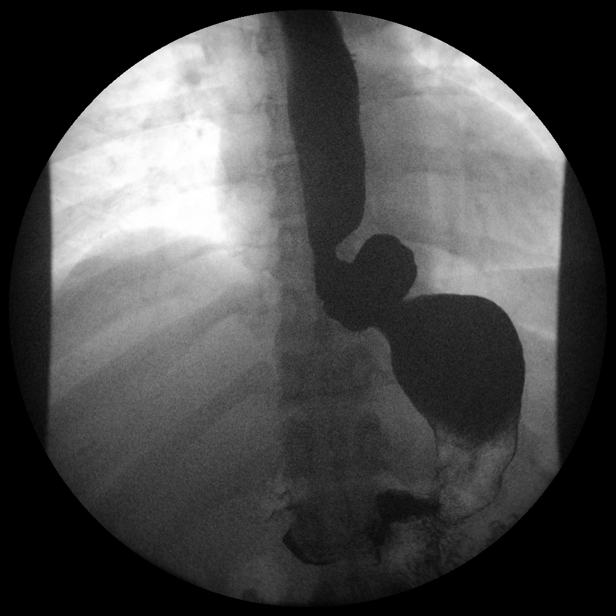

[Series 22: run · 1 of 1 slices shown (14 of 14)]
[im 1/1]
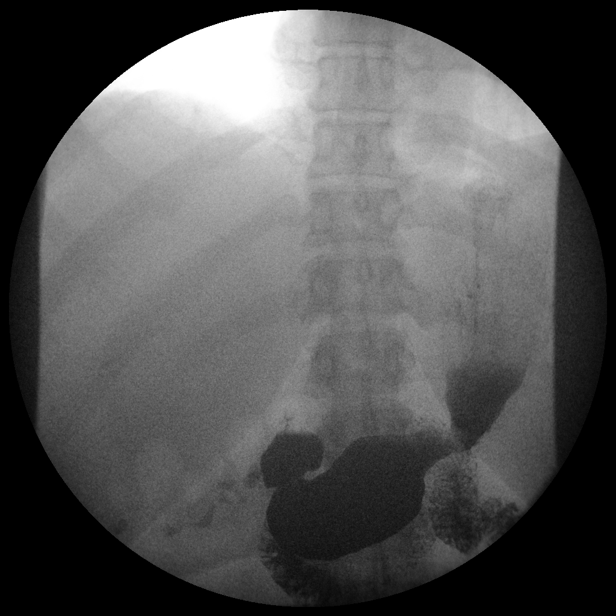

[14 of 24 positions shown; findings below may reference images not displayed]

FINDINGS: Initial barium swallows demonstrate normal pharyngeal motion with
swallowing. No laryngeal penetration or aspiration. No upper
esophageal webs, strictures or diverticuli.

Nonspecific esophageal dysmotility. Occasional disruption of the
primary peristaltic wave and occasional tertiary contraction. There
is a moderate-sized sliding-type hiatal hernia and associated gross
GE reflux. No stricture or mass is identified. The 13 mm barium
tablet passed into the stomach without difficulty. Surgical changes
involving the stomach from a gastric sleeve procedure. No
complicating features.
IMPRESSION: 1. Moderate-sized hiatal hernia with gross GE reflux.
2. Nonspecific esophageal dysmotility.
3. No esophageal stricture or mass.

## 2015-07-09 ENCOUNTER — Other Ambulatory Visit (HOSPITAL_COMMUNITY): Payer: Self-pay | Admitting: *Deleted

## 2015-07-10 ENCOUNTER — Ambulatory Visit (HOSPITAL_COMMUNITY)
Admission: RE | Admit: 2015-07-10 | Discharge: 2015-07-10 | Disposition: A | Discharge status: Home / Self Care | Payer: Managed Care, Other (non HMO) | Source: Ambulatory Visit | Attending: Rheumatology | Admitting: Rheumatology

## 2015-07-10 DIAGNOSIS — M069 Rheumatoid arthritis, unspecified: Secondary | ICD-10-CM | POA: Diagnosis present

## 2015-07-10 MED ORDER — SODIUM CHLORIDE 0.9 % IV SOLN: INTRAVENOUS | Status: DC

## 2015-07-10 MED ORDER — ACETAMINOPHEN 325 MG PO TABS
ORAL_TABLET | ORAL | Status: AC
  Administered 2015-07-10: 650 mg via ORAL
  Filled 2015-07-10: qty 2

## 2015-07-10 MED ORDER — DIPHENHYDRAMINE HCL 25 MG PO CAPS
25.0000 mg | ORAL_CAPSULE | ORAL | Status: DC
  Administered 2015-07-10: 25 mg via ORAL

## 2015-07-10 MED ORDER — DIPHENHYDRAMINE HCL 25 MG PO CAPS
ORAL_CAPSULE | ORAL | Status: AC
  Administered 2015-07-10: 25 mg via ORAL
  Filled 2015-07-10: qty 1

## 2015-07-10 MED ORDER — ACETAMINOPHEN 325 MG PO TABS
650.0000 mg | ORAL_TABLET | ORAL | Status: DC
  Administered 2015-07-10: 650 mg via ORAL

## 2015-07-10 MED ORDER — SODIUM CHLORIDE 0.9 % IV SOLN
1000.0000 mg | INTRAVENOUS | Status: DC
  Administered 2015-07-10: 1000 mg via INTRAVENOUS
  Filled 2015-07-10: qty 40

## 2015-08-07 ENCOUNTER — Encounter (HOSPITAL_COMMUNITY): Payer: Self-pay

## 2015-08-11 ENCOUNTER — Ambulatory Visit (HOSPITAL_COMMUNITY)
Admission: RE | Admit: 2015-08-11 | Discharge: 2015-08-11 | Disposition: A | Discharge status: Home / Self Care | Payer: Managed Care, Other (non HMO) | Source: Ambulatory Visit | Attending: Rheumatology | Admitting: Rheumatology

## 2015-08-11 DIAGNOSIS — Z79899 Other long term (current) drug therapy: Secondary | ICD-10-CM | POA: Insufficient documentation

## 2015-08-11 DIAGNOSIS — M069 Rheumatoid arthritis, unspecified: Secondary | ICD-10-CM | POA: Diagnosis present

## 2015-08-11 LAB — COMPREHENSIVE METABOLIC PANEL
ALK PHOS: 77 U/L (ref 38–126)
ALT: 15 U/L (ref 14–54)
ANION GAP: 8 (ref 5–15)
AST: 23 U/L (ref 15–41)
Albumin: 3.2 g/dL — ABNORMAL LOW (ref 3.5–5.0)
BILIRUBIN TOTAL: 0.2 mg/dL — AB (ref 0.3–1.2)
BUN: 13 mg/dL (ref 6–20)
CALCIUM: 9.2 mg/dL (ref 8.9–10.3)
CO2: 26 mmol/L (ref 22–32)
CREATININE: 1.19 mg/dL — AB (ref 0.44–1.00)
Chloride: 109 mmol/L (ref 101–111)
GFR calc non Af Amer: 53 mL/min — ABNORMAL LOW (ref >60)
GLUCOSE: 96 mg/dL (ref 65–99)
Potassium: 4.4 mmol/L (ref 3.5–5.1)
Sodium: 143 mmol/L (ref 135–145)
TOTAL PROTEIN: 6.4 g/dL — AB (ref 6.5–8.1)

## 2015-08-11 LAB — CBC WITH DIFFERENTIAL/PLATELET
Basophils Absolute: 0.1 10*3/uL (ref 0.0–0.1)
Basophils Relative: 1 %
Eosinophils Absolute: 0.2 10*3/uL (ref 0.0–0.7)
Eosinophils Relative: 3 %
HEMATOCRIT: 42.6 % (ref 36.0–46.0)
HEMOGLOBIN: 13.8 g/dL (ref 12.0–15.0)
LYMPHS ABS: 4.1 10*3/uL — AB (ref 0.7–4.0)
LYMPHS PCT: 44 %
MCH: 27.4 pg (ref 26.0–34.0)
MCHC: 32.4 g/dL (ref 30.0–36.0)
MCV: 84.7 fL (ref 78.0–100.0)
MONOS PCT: 6 %
Monocytes Absolute: 0.5 10*3/uL (ref 0.1–1.0)
NEUTROS ABS: 4.3 10*3/uL (ref 1.7–7.7)
NEUTROS PCT: 46 %
Platelets: 271 10*3/uL (ref 150–400)
RBC: 5.03 MIL/uL (ref 3.87–5.11)
RDW: 13.1 % (ref 11.5–15.5)
WBC: 9.2 10*3/uL (ref 4.0–10.5)

## 2015-08-11 MED ORDER — SODIUM CHLORIDE 0.9 % IV SOLN
1000.0000 mg | INTRAVENOUS | Status: AC
  Administered 2015-08-11: 1000 mg via INTRAVENOUS
  Filled 2015-08-11: qty 40

## 2015-08-11 MED ORDER — SODIUM CHLORIDE 0.9 % IV SOLN
1000.0000 mg | INTRAVENOUS | Status: DC
Start: 2015-09-04 — End: 2015-08-11
  Filled 2015-08-11: qty 40

## 2015-08-11 MED ORDER — DIPHENHYDRAMINE HCL 25 MG PO CAPS
ORAL_CAPSULE | ORAL | Status: AC
  Administered 2015-08-11: 25 mg
  Filled 2015-08-11: qty 1

## 2015-08-11 MED ORDER — SODIUM CHLORIDE 0.9 % IV SOLN
INTRAVENOUS | Status: AC
  Administered 2015-08-11: 12:00:00 via INTRAVENOUS

## 2015-08-11 MED ORDER — ACETAMINOPHEN 325 MG PO TABS
650.0000 mg | ORAL_TABLET | ORAL | Status: AC
  Administered 2015-08-11: 650 mg via ORAL

## 2015-08-11 MED ORDER — SODIUM CHLORIDE 0.9 % IV SOLN: INTRAVENOUS | Status: DC

## 2015-08-11 MED ORDER — ACETAMINOPHEN 325 MG PO TABS
ORAL_TABLET | ORAL | Status: AC
  Filled 2015-08-11: qty 2

## 2015-08-11 MED ORDER — ACETAMINOPHEN 325 MG PO TABS: 650.0000 mg | ORAL_TABLET | ORAL | Status: DC

## 2015-08-11 MED ORDER — DIPHENHYDRAMINE HCL 25 MG PO CAPS: 25.0000 mg | ORAL_CAPSULE | ORAL | Status: DC

## 2015-08-24 ENCOUNTER — Emergency Department (HOSPITAL_BASED_OUTPATIENT_CLINIC_OR_DEPARTMENT_OTHER)
Admission: EM | Admit: 2015-08-24 | Discharge: 2015-08-24 | Disposition: A | Discharge status: Home / Self Care | Payer: Managed Care, Other (non HMO) | Attending: Emergency Medicine | Admitting: Emergency Medicine

## 2015-08-24 ENCOUNTER — Encounter (HOSPITAL_BASED_OUTPATIENT_CLINIC_OR_DEPARTMENT_OTHER): Payer: Self-pay | Admitting: *Deleted

## 2015-08-24 DIAGNOSIS — F419 Anxiety disorder, unspecified: Secondary | ICD-10-CM | POA: Insufficient documentation

## 2015-08-24 DIAGNOSIS — Z8719 Personal history of other diseases of the digestive system: Secondary | ICD-10-CM | POA: Diagnosis not present

## 2015-08-24 DIAGNOSIS — Z8619 Personal history of other infectious and parasitic diseases: Secondary | ICD-10-CM | POA: Insufficient documentation

## 2015-08-24 DIAGNOSIS — R110 Nausea: Secondary | ICD-10-CM | POA: Diagnosis not present

## 2015-08-24 DIAGNOSIS — F329 Major depressive disorder, single episode, unspecified: Secondary | ICD-10-CM | POA: Diagnosis not present

## 2015-08-24 DIAGNOSIS — I2510 Atherosclerotic heart disease of native coronary artery without angina pectoris: Secondary | ICD-10-CM | POA: Insufficient documentation

## 2015-08-24 DIAGNOSIS — M545 Low back pain, unspecified: Secondary | ICD-10-CM

## 2015-08-24 DIAGNOSIS — E785 Hyperlipidemia, unspecified: Secondary | ICD-10-CM | POA: Insufficient documentation

## 2015-08-24 DIAGNOSIS — M069 Rheumatoid arthritis, unspecified: Secondary | ICD-10-CM | POA: Diagnosis not present

## 2015-08-24 LAB — URINALYSIS, ROUTINE W REFLEX MICROSCOPIC
Bilirubin Urine: NEGATIVE
GLUCOSE, UA: NEGATIVE mg/dL
Hgb urine dipstick: NEGATIVE
Ketones, ur: NEGATIVE mg/dL
LEUKOCYTES UA: NEGATIVE
NITRITE: NEGATIVE
PROTEIN: NEGATIVE mg/dL
Specific Gravity, Urine: 1.027 (ref 1.005–1.030)
pH: 6 (ref 5.0–8.0)

## 2015-08-24 MED ORDER — DIAZEPAM 5 MG PO TABS: 5.0000 mg | ORAL_TABLET | Freq: Four times a day (QID) | ORAL | Status: DC | PRN

## 2015-08-24 NOTE — ED Notes (Signed)
Mid right back pain since Friday- denies injury or falls

## 2015-08-24 NOTE — ED Provider Notes (Signed)
CSN: [id number]     Arrival date & time 08/24/15  1607 History  By signing my name below, I, Budd Palmer, attest that this documentation has been prepared under the direction and in the presence of Marily Memos, MD. Electronically Signed: Budd Palmer, ED Scribe. 08/24/2015. 6:02 PM.     Chief Complaint  Patient presents with  . Back Pain   The history is provided by the patient. No language interpreter was used.   HPI Comments: [name] [name] is a 50 y.o. female with a PMHx of rheumatoid arthritis, morbid obesity, and fibromyalgia who presents to the Emergency Department complaining of right-sided mid-back pain onset 2 days ago. Pt states it feels as though she has a bowling ball in her back. She reports associated nausea. She notes exacerbation of the pain with movement and sitting up straight. She states that this does not feel like her typical arthritis pains. She reports a PMHx of similar pain 5-10 years ago with a kidney stone, but notes that this pain is much less severe. She notes that this does not feel like a muscle strain. She denies any heavy lifting the day before onset. She denies any recent sick contacts. Pt denies rash, hematuria, dysuria, cough, v/d, constipation, and numbness.   Past Medical History  Diagnosis Date  . Rheumatoid arthritis(714.0)   . Depression   . Morbid obesity (HCC)   . HSV-1 infection   . Diastolic dysfunction   . Fibromyalgia   . Hiatal hernia   . Esophageal ring   . Dyslipidemia 01/30/2015  . Coronary artery calcification seen on CAT scan     minimal CAD with 30% prox and mild LAD  . Anxiety    Past Surgical History  Procedure Laterality Date  . Cesarean section    . Tubal ligation    . Laparoscopic gastric sleeve resection  11/21/12    [facility]  . Left heart catheterization with coronary angiogram N/A 08/26/2014    Procedure: LEFT HEART CATHETERIZATION WITH CORONARY ANGIOGRAM;  Surgeon: Runell Gess, MD;  Location: [facility] CATH  LAB;  Service: Cardiovascular;  Laterality: N/A;  . Esophageal manometry N/A 10/28/2014    Procedure: ESOPHAGEAL MANOMETRY (EM);  Surgeon: Hilarie Fredrickson, MD;  Location: [facility] ENDOSCOPY;  Service: Endoscopy;  Laterality: N/A;  . Hernia repair      reports surgery on 3 hernias, with 2 more present   Family History  Problem Relation Age of Onset  . Hyperlipidemia Mother   . Depression Mother   . Sarcoidosis Father   . Lung disease Father     Pleural Mesothelioma  . Cancer Father   . Heart attack Paternal Grandmother   . Hypertension Paternal Grandmother   . Stroke Neg Hx   . Colon cancer Neg Hx   . Esophageal cancer Neg Hx   . Rectal cancer Neg Hx   . Stomach cancer Neg Hx   . Hypertension Maternal Grandmother   . Diabetes Maternal Grandmother    Social History  Substance Use Topics  . Smoking status: Never Smoker   . Smokeless tobacco: Never Used  . Alcohol Use: No   OB History    No data available     Review of Systems  Respiratory: Negative for cough.   Gastrointestinal: Positive for nausea. Negative for vomiting, diarrhea and constipation.  Genitourinary: Negative for dysuria and hematuria.  Musculoskeletal: Positive for back pain.  Skin: Negative for rash.  Neurological: Negative for numbness.  All other systems reviewed and  are negative.   Allergies  Flu virus vaccine and Influenza vaccines  Home Medications   Prior to Admission medications   Medication Sig Start Date End Date Taking? Authorizing Provider  Adapalene 0.3 % gel Apply 1 application topically daily as needed (acne).  12/31/14  Yes Historical Provider, MD  ARIPiprazole (ABILIFY) 2 MG tablet Take 1 tablet (2 mg total) by mouth daily. 04/22/15  Yes Benjaman Pott, MD  atorvastatin (LIPITOR) 80 MG tablet Take 1 tablet (80 mg total) by mouth daily at 6 PM. 12/28/14  Yes Adonis Brook, NP  Betamethasone Valerate 0.12 % foam Apply 1 application topically daily as needed. Itching 04/22/15  Yes Historical  Provider, MD  buPROPion (WELLBUTRIN XL) 300 MG 24 hr tablet Take 1 tablet (300 mg total) by mouth daily. 12/28/14  Yes Adonis Brook, NP  CARAFATE 1 GM/10ML suspension TAKE 10 MLS (1 G TOTAL) BY MOUTH 4 (FOUR) TIMES DAILY - WITH MEALS AND AT BEDTIME. 11/18/14  Yes Historical Provider, MD  diclofenac sodium (VOLTAREN) 1 % GEL Apply 2 g topically 2 (two) times daily as needed (pain).  01/28/15  Yes Historical Provider, MD  diltiazem (CARTIA XT) 180 MG 24 hr capsule Take 1 capsule (180 mg total) by mouth daily. 01/01/15  Yes Adonis Brook, NP  doxepin (SINEQUAN) 10 MG capsule Take 1 capsule (10 mg total) by mouth at bedtime. 12/28/14  Yes Adonis Brook, NP  DULoxetine (CYMBALTA) 30 MG capsule Take 30 mg by mouth 2 (two) times daily.  12/30/14  Yes Historical Provider, MD  EPIPEN 2-PAK 0.3 MG/0.3ML SOAJ injection Inject 0.3 mg into the skin once.  12/30/14  Yes Historical Provider, MD  famciclovir (FAMVIR) 500 MG tablet TAKE 1 TAB BY MOUTH DAILY FOR PREVENTION OR 3 TABS AT ONSET OF FEVER BLISTER X 1 DAY 01/21/15  Yes Historical Provider, MD  FLUoxetine (PROZAC) 10 MG capsule Take 10 mg by mouth daily.   Yes Historical Provider, MD  fluticasone (FLONASE) 50 MCG/ACT nasal spray Place 2 sprays into both nostrils as needed for allergies. 12/28/14 12/29/15 Yes Adonis Brook, NP  gabapentin (NEURONTIN) 300 MG capsule Take 1 capsule (300 mg total) by mouth 3 (three) times daily. 04/22/15  Yes Benjaman Pott, MD  hydrOXYzine (VISTARIL) 25 MG capsule Take 25 mg by mouth every 8 (eight) hours as needed for anxiety.  12/30/14  Yes Historical Provider, MD  levothyroxine (SYNTHROID, LEVOTHROID) 25 MCG tablet Take 1 tablet (25 mcg total) by mouth daily before breakfast. 12/28/14  Yes Adonis Brook, NP  lidocaine (LIDODERM) 5 % Place 1 patch onto the skin daily as needed. 03/29/15  Yes Historical Provider, MD  OTREXUP 25 MG/0.4ML SOAJ Inject 25 mg into the skin once a week. Tuesday 01/28/15  Yes Historical Provider, MD   pantoprazole (PROTONIX) 40 MG tablet TAKE 1 TABLET (40 MG TOTAL) BY MOUTH 2 (TWO) TIMES DAILY. 03/13/15  Yes Amy S Esterwood, PA-C  tapentadol (NUCYNTA) 50 MG TABS tablet Take 50 mg by mouth every 6 (six) hours as needed for moderate pain.    Yes Historical Provider, MD  TL GARD RX 2.2-25-1 MG TABS Take 1 tablet by mouth daily.  01/28/15  Yes Historical Provider, MD  Topiramate ER 50 MG CP24 Take 50 mg by mouth daily as needed (migraines). Trokendi XR 12/28/14  Yes Adonis Brook, NP  tretinoin (RETIN-A) 0.025 % cream Apply 1 application topically daily. 02/11/15  Yes Historical Provider, MD  triamcinolone cream (KENALOG) 0.1 % Apply 1 application topically daily as  needed. Itching 04/13/15  Yes Historical Provider, MD  Vitamin D, Ergocalciferol, (DRISDOL) 50000 UNITS CAPS capsule Take 500,000 Units by mouth 3 (three) times a week. Paulo Fruit, Caleen Essex 01/21/15 01/22/16 Yes Historical Provider, MD  XERESE 5-1 % CREA Apply 1 application topically daily as needed (fever blisters).  12/30/14  Yes Historical Provider, MD  clindamycin-benzoyl peroxide (BENZACLIN) gel Apply 1 application topically daily as needed. Acne 02/13/15   Historical Provider, MD  clobetasol cream (TEMOVATE) 0.05 % Apply 1 application topically daily as needed. Skin discoloration 04/22/15   Historical Provider, MD  cyclobenzaprine (FLEXERIL) 10 MG tablet TAKE 1 TABLET BY MOUTH EVERY 6-8 HOURS AS NEEDED FOR MUSCLE SPASMS 01/22/15   Historical Provider, MD  diazepam (VALIUM) 5 MG tablet Take 1 tablet (5 mg total) by mouth every 6 (six) hours as needed (spasms). 08/24/15   Marily Memos, MD  neomycin-polymyxin-hydrocortisone (CORTISPORIN) 3.5-10000-1 otic suspension Place 1-2 drops into the left ear daily as needed. 04/09/15   Historical Provider, MD   BP 111/88 mmHg  Pulse 82  Temp(Src) 98 F (36.7 C) (Oral)  Resp 18  Ht 5\' 2"  (1.575 m)  Wt 249 lb (112.946 kg)  BMI 45.53 kg/m2  SpO2 100% Physical Exam  Constitutional: She is oriented to person,  place, and time. She appears well-developed and well-nourished.  HENT:  Head: Normocephalic and atraumatic.  Eyes: Conjunctivae are normal. Right eye exhibits no discharge. Left eye exhibits no discharge.  Pulmonary/Chest: Effort normal. No respiratory distress.  Abdominal: There is no CVA tenderness.  Musculoskeletal: Normal range of motion. She exhibits edema.  R thoracic paraspinal TTP, no saddle anaesthesia, no CVA or midline TTP, no left-sided paraspinal discomfort  Neurological: She is alert and oriented to person, place, and time. Coordination normal.  Normal strength and sensation  Skin: Skin is warm and dry. No rash noted. She is not diaphoretic. No erythema.  Psychiatric: She has a normal mood and affect.  Nursing note and vitals reviewed.   ED Course  Procedures  DIAGNOSTIC STUDIES: Oxygen Saturation is 100% on RA, normal by my interpretation.    COORDINATION OF CARE: 6:00 PM - Discussed lab results, probable muscle spasm, and plans to order valium to be taken if pt's flexeril does not alleviate the pain. Advised pt not to take flexeril and valium at the same time. Recommended pt apply warm compresses and do some stretches. Pt advised of plan for treatment and pt agrees.  Labs Review Labs Reviewed  URINALYSIS, ROUTINE W REFLEX MICROSCOPIC (NOT AT [facility]) - Abnormal; Notable for the following:    APPearance CLOUDY (*)    All other components within normal limits    Imaging Review No results found. I have personally reviewed and evaluated these images and lab results as part of my medical decision-making.   EKG Interpretation None      MDM   Final diagnoses:  Right-sided low back pain without sciatica    Likely MSK pain. Doubt nephrolithiasis, AAA, spinal pathology. Will treat symptomatically and follow up with pcp if not improving, here if worsening.     New Prescriptions: Discharge Medication List as of 08/24/2015  6:02 PM    START taking these medications    Details  diazepam (VALIUM) 5 MG tablet Take 1 tablet (5 mg total) by mouth every 6 (six) hours as needed (spasms)., Starting 08/24/2015, Until Discontinued, Print         I have personally and contemperaneously reviewed labs and imaging and used in my decision making  as above.   A medical screening exam was performed and I feel the patient has had an appropriate workup for their chief complaint at this time and likelihood of emergent condition existing is low. Their vital signs are stable. They have been counseled on decision, discharge, follow up and which symptoms necessitate immediate return to the emergency department.  They verbally stated understanding and agreement with plan and discharged in stable condition.    I personally performed the services described in this documentation, which was scribed in my presence. The recorded information has been reviewed and is accurate.    Marily Memos, MD 08/27/15 1534

## 2015-09-03 ENCOUNTER — Emergency Department (HOSPITAL_COMMUNITY): Payer: Managed Care, Other (non HMO)

## 2015-09-03 ENCOUNTER — Emergency Department (HOSPITAL_COMMUNITY)
Admission: EM | Admit: 2015-09-03 | Discharge: 2015-09-03 | Disposition: A | Discharge status: Home / Self Care | Payer: Managed Care, Other (non HMO) | Attending: Emergency Medicine | Admitting: Emergency Medicine

## 2015-09-03 ENCOUNTER — Encounter (HOSPITAL_COMMUNITY): Payer: Self-pay | Admitting: Neurology

## 2015-09-03 DIAGNOSIS — Z9889 Other specified postprocedural states: Secondary | ICD-10-CM | POA: Insufficient documentation

## 2015-09-03 DIAGNOSIS — R0602 Shortness of breath: Secondary | ICD-10-CM | POA: Insufficient documentation

## 2015-09-03 DIAGNOSIS — M069 Rheumatoid arthritis, unspecified: Secondary | ICD-10-CM | POA: Insufficient documentation

## 2015-09-03 DIAGNOSIS — I2510 Atherosclerotic heart disease of native coronary artery without angina pectoris: Secondary | ICD-10-CM | POA: Insufficient documentation

## 2015-09-03 DIAGNOSIS — Z79899 Other long term (current) drug therapy: Secondary | ICD-10-CM | POA: Insufficient documentation

## 2015-09-03 DIAGNOSIS — M797 Fibromyalgia: Secondary | ICD-10-CM | POA: Diagnosis not present

## 2015-09-03 DIAGNOSIS — R05 Cough: Secondary | ICD-10-CM | POA: Diagnosis not present

## 2015-09-03 DIAGNOSIS — Z8619 Personal history of other infectious and parasitic diseases: Secondary | ICD-10-CM | POA: Diagnosis not present

## 2015-09-03 DIAGNOSIS — R079 Chest pain, unspecified: Secondary | ICD-10-CM | POA: Diagnosis present

## 2015-09-03 DIAGNOSIS — F329 Major depressive disorder, single episode, unspecified: Secondary | ICD-10-CM | POA: Diagnosis not present

## 2015-09-03 DIAGNOSIS — K219 Gastro-esophageal reflux disease without esophagitis: Secondary | ICD-10-CM | POA: Insufficient documentation

## 2015-09-03 DIAGNOSIS — R0789 Other chest pain: Secondary | ICD-10-CM | POA: Diagnosis not present

## 2015-09-03 DIAGNOSIS — E785 Hyperlipidemia, unspecified: Secondary | ICD-10-CM | POA: Insufficient documentation

## 2015-09-03 DIAGNOSIS — F419 Anxiety disorder, unspecified: Secondary | ICD-10-CM | POA: Insufficient documentation

## 2015-09-03 LAB — CBC
HEMATOCRIT: 40.2 % (ref 36.0–46.0)
HEMOGLOBIN: 12.8 g/dL (ref 12.0–15.0)
MCH: 26.8 pg (ref 26.0–34.0)
MCHC: 31.8 g/dL (ref 30.0–36.0)
MCV: 84.1 fL (ref 78.0–100.0)
Platelets: 239 10*3/uL (ref 150–400)
RBC: 4.78 MIL/uL (ref 3.87–5.11)
RDW: 12.6 % (ref 11.5–15.5)
WBC: 7.9 10*3/uL (ref 4.0–10.5)

## 2015-09-03 LAB — BASIC METABOLIC PANEL
ANION GAP: 7 (ref 5–15)
BUN: 11 mg/dL (ref 6–20)
CALCIUM: 8.3 mg/dL — AB (ref 8.9–10.3)
CO2: 23 mmol/L (ref 22–32)
Chloride: 111 mmol/L (ref 101–111)
Creatinine, Ser: 1 mg/dL (ref 0.44–1.00)
GLUCOSE: 80 mg/dL (ref 65–99)
POTASSIUM: 4.1 mmol/L (ref 3.5–5.1)
Sodium: 141 mmol/L (ref 135–145)

## 2015-09-03 LAB — I-STAT TROPONIN, ED: TROPONIN I, POC: 0 ng/mL (ref 0.00–0.08)

## 2015-09-03 MED ORDER — IBUPROFEN 400 MG PO TABS
400.0000 mg | ORAL_TABLET | Freq: Once | ORAL | Status: AC
  Administered 2015-09-03: 400 mg via ORAL
  Filled 2015-09-03: qty 1

## 2015-09-03 MED ORDER — TRAMADOL HCL 50 MG PO TABS
50.0000 mg | ORAL_TABLET | Freq: Once | ORAL | Status: AC
  Administered 2015-09-03: 50 mg via ORAL
  Filled 2015-09-03: qty 1

## 2015-09-03 MED ORDER — HYDROCODONE-ACETAMINOPHEN 5-325 MG PO TABS: 1.0000 | ORAL_TABLET | Freq: Four times a day (QID) | ORAL | Status: DC | PRN

## 2015-09-03 NOTE — ED Provider Notes (Signed)
CSN: [id number]     Arrival date & time 09/03/15  1815 History   First MD Initiated Contact with Patient 09/03/15 1816     Chief Complaint  Patient presents with  . Chest Pain     (Consider location/radiation/quality/duration/timing/severity/associated sxs/prior Treatment) Patient is a 50 y.o. female presenting with chest pain. The history is provided by the patient.  Chest Pain Associated symptoms: no abdominal pain, no back pain, no fever, no headache, no palpitations, no shortness of breath and not vomiting   Patient notes hx RA and fibromyalgia, states that early this afternoon, onset left chest pain. Constant. Dull. Moderate.  No radiating. No diaphoresis or nv. Mild sob. Had cough yesterday, episodic, mild. No sore throat. No fever or chills. No hx same pain. Hx gerd. No hx cad or fam hx premature cad.  No leg pain or swelling. No recent trauma, immobility, surgery or travel. No hx dvt or pe.  Had prior cardiac cath, neg.   Pt denies chest wall injury or strain. No syncope. No recent exertional cp or discomfort. No unusual doe or fatigue.      Past Medical History  Diagnosis Date  . Rheumatoid arthritis(714.0)   . Depression   . Morbid obesity (HCC)   . HSV-1 infection   . Diastolic dysfunction   . Fibromyalgia   . Hiatal hernia   . Esophageal ring   . Dyslipidemia 01/30/2015  . Coronary artery calcification seen on CAT scan     minimal CAD with 30% prox and mild LAD  . Anxiety    Past Surgical History  Procedure Laterality Date  . Cesarean section    . Tubal ligation    . Laparoscopic gastric sleeve resection  11/21/12    [facility]  . Left heart catheterization with coronary angiogram N/A 08/26/2014    Procedure: LEFT HEART CATHETERIZATION WITH CORONARY ANGIOGRAM;  Surgeon: Runell Gess, MD;  Location: [facility] CATH LAB;  Service: Cardiovascular;  Laterality: N/A;  . Esophageal manometry N/A 10/28/2014    Procedure: ESOPHAGEAL MANOMETRY (EM);  Surgeon: Hilarie Fredrickson, MD;   Location: [facility] ENDOSCOPY;  Service: Endoscopy;  Laterality: N/A;  . Hernia repair      reports surgery on 3 hernias, with 2 more present   Family History  Problem Relation Age of Onset  . Hyperlipidemia Mother   . Depression Mother   . Sarcoidosis Father   . Lung disease Father     Pleural Mesothelioma  . Cancer Father   . Heart attack Paternal Grandmother   . Hypertension Paternal Grandmother   . Stroke Neg Hx   . Colon cancer Neg Hx   . Esophageal cancer Neg Hx   . Rectal cancer Neg Hx   . Stomach cancer Neg Hx   . Hypertension Maternal Grandmother   . Diabetes Maternal Grandmother    Social History  Substance Use Topics  . Smoking status: Never Smoker   . Smokeless tobacco: Never Used  . Alcohol Use: No   OB History    No data available     Review of Systems  Constitutional: Negative for fever and chills.  HENT: Negative for sore throat.   Eyes: Negative for redness.  Respiratory: Negative for shortness of breath.   Cardiovascular: Positive for chest pain. Negative for palpitations and leg swelling.  Gastrointestinal: Negative for vomiting and abdominal pain.  Genitourinary: Negative for flank pain.  Musculoskeletal: Negative for back pain and neck pain.  Skin: Negative for rash.  Neurological: Negative for  headaches.  Hematological: Does not bruise/bleed easily.  Psychiatric/Behavioral: Negative for confusion.      Allergies  Flu virus vaccine and Influenza vaccines  Home Medications   Prior to Admission medications   Medication Sig Start Date End Date Taking? Authorizing Provider  Adapalene 0.3 % gel Apply 1 application topically daily as needed (acne).  12/31/14   Historical Provider, MD  ARIPiprazole (ABILIFY) 2 MG tablet Take 1 tablet (2 mg total) by mouth daily. 04/22/15   Benjaman Pott, MD  atorvastatin (LIPITOR) 80 MG tablet Take 1 tablet (80 mg total) by mouth daily at 6 PM. 12/28/14   Adonis Brook, NP  Betamethasone Valerate 0.12 % foam Apply 1  application topically daily as needed. Itching 04/22/15   Historical Provider, MD  buPROPion (WELLBUTRIN XL) 300 MG 24 hr tablet Take 1 tablet (300 mg total) by mouth daily. 12/28/14   Adonis Brook, NP  CARAFATE 1 GM/10ML suspension TAKE 10 MLS (1 G TOTAL) BY MOUTH 4 (FOUR) TIMES DAILY - WITH MEALS AND AT BEDTIME. 11/18/14   Historical Provider, MD  clindamycin-benzoyl peroxide (BENZACLIN) gel Apply 1 application topically daily as needed. Acne 02/13/15   Historical Provider, MD  clobetasol cream (TEMOVATE) 0.05 % Apply 1 application topically daily as needed. Skin discoloration 04/22/15   Historical Provider, MD  cyclobenzaprine (FLEXERIL) 10 MG tablet TAKE 1 TABLET BY MOUTH EVERY 6-8 HOURS AS NEEDED FOR MUSCLE SPASMS 01/22/15   Historical Provider, MD  diazepam (VALIUM) 5 MG tablet Take 1 tablet (5 mg total) by mouth every 6 (six) hours as needed (spasms). 08/24/15   Marily Memos, MD  diclofenac sodium (VOLTAREN) 1 % GEL Apply 2 g topically 2 (two) times daily as needed (pain).  01/28/15   Historical Provider, MD  diltiazem (CARTIA XT) 180 MG 24 hr capsule Take 1 capsule (180 mg total) by mouth daily. 01/01/15   Adonis Brook, NP  doxepin (SINEQUAN) 10 MG capsule Take 1 capsule (10 mg total) by mouth at bedtime. 12/28/14   Adonis Brook, NP  DULoxetine (CYMBALTA) 30 MG capsule Take 30 mg by mouth 2 (two) times daily.  12/30/14   Historical Provider, MD  EPIPEN 2-PAK 0.3 MG/0.3ML SOAJ injection Inject 0.3 mg into the skin once.  12/30/14   Historical Provider, MD  famciclovir (FAMVIR) 500 MG tablet TAKE 1 TAB BY MOUTH DAILY FOR PREVENTION OR 3 TABS AT ONSET OF FEVER BLISTER X 1 DAY 01/21/15   Historical Provider, MD  FLUoxetine (PROZAC) 10 MG capsule Take 10 mg by mouth daily.    Historical Provider, MD  fluticasone (FLONASE) 50 MCG/ACT nasal spray Place 2 sprays into both nostrils as needed for allergies. 12/28/14 12/29/15  Adonis Brook, NP  gabapentin (NEURONTIN) 300 MG capsule Take 1 capsule (300 mg  total) by mouth 3 (three) times daily. 04/22/15   Benjaman Pott, MD  hydrOXYzine (VISTARIL) 25 MG capsule Take 25 mg by mouth every 8 (eight) hours as needed for anxiety.  12/30/14   Historical Provider, MD  levothyroxine (SYNTHROID, LEVOTHROID) 25 MCG tablet Take 1 tablet (25 mcg total) by mouth daily before breakfast. 12/28/14   Adonis Brook, NP  lidocaine (LIDODERM) 5 % Place 1 patch onto the skin daily as needed. 03/29/15   Historical Provider, MD  neomycin-polymyxin-hydrocortisone (CORTISPORIN) 3.5-10000-1 otic suspension Place 1-2 drops into the left ear daily as needed. 04/09/15   Historical Provider, MD  OTREXUP 25 MG/0.4ML SOAJ Inject 25 mg into the skin once a week. Tuesday 01/28/15   Historical  Provider, MD  pantoprazole (PROTONIX) 40 MG tablet TAKE 1 TABLET (40 MG TOTAL) BY MOUTH 2 (TWO) TIMES DAILY. 03/13/15   Amy S Esterwood, PA-C  tapentadol (NUCYNTA) 50 MG TABS tablet Take 50 mg by mouth every 6 (six) hours as needed for moderate pain.     Historical Provider, MD  TL GARD RX 2.2-25-1 MG TABS Take 1 tablet by mouth daily.  01/28/15   Historical Provider, MD  Topiramate ER 50 MG CP24 Take 50 mg by mouth daily as needed (migraines). Trokendi XR 12/28/14   Adonis Brook, NP  tretinoin (RETIN-A) 0.025 % cream Apply 1 application topically daily. 02/11/15   Historical Provider, MD  triamcinolone cream (KENALOG) 0.1 % Apply 1 application topically daily as needed. Itching 04/13/15   Historical Provider, MD  Vitamin D, Ergocalciferol, (DRISDOL) 50000 UNITS CAPS capsule Take 500,000 Units by mouth 3 (three) times a week. Paulo Fruit, Caleen Essex 01/21/15 01/22/16  Historical Provider, MD  XERESE 5-1 % CREA Apply 1 application topically daily as needed (fever blisters).  12/30/14   Historical Provider, MD   BP 104/73 mmHg  Pulse 65  Temp(Src) 98 F (36.7 C) (Oral)  Resp 14  SpO2 97% Physical Exam  Constitutional: She appears well-developed and well-nourished. No distress.  HENT:  Mouth/Throat: Oropharynx  is clear and moist.  Eyes: Conjunctivae are normal. No scleral icterus.  Neck: Neck supple. No tracheal deviation present.  Cardiovascular: Normal rate, regular rhythm, normal heart sounds and intact distal pulses.  Exam reveals no gallop and no friction rub.   No murmur heard. Pulmonary/Chest: Effort normal. No respiratory distress. She exhibits tenderness.  Abdominal: Soft. Normal appearance and bowel sounds are normal. She exhibits no distension. There is no tenderness.  Musculoskeletal: She exhibits no edema or tenderness.  Neurological: She is alert.  Skin: Skin is warm and dry. No rash noted. She is not diaphoretic.  Psychiatric: She has a normal mood and affect.  Nursing note and vitals reviewed.   ED Course  Procedures (including critical care time) Labs Review      I have personally reviewed and evaluated these images and lab results as part of my medical decision-making.   EKG Interpretation   Date/Time:  Wednesday September 03 2015 18:18:33 EDT Ventricular Rate:  67 PR Interval:  123 QRS Duration: 98 QT Interval:  398 QTC Calculation: 420 R Axis:   80 Text Interpretation:  Sinus rhythm Ventricular premature complex Low  voltage, precordial leads Confirmed by Denton Lank  MD, Caryn Bee ([id number]) on  09/03/2015 6:26:11 PM      MDM   Labs. Cxr.  Reviewed nursing notes and prior charts for additional history.     Expand All Collapse All        CARDIAC CATHETERIZATION      ANGIOGRAPHIC RESULTS:   1. Left main; normal  2. LAD; normal 3. Left circumflex; normal.  4. Right coronary artery; dominant and normal 5. Left ventriculography; RAO left ventriculogram was performed using  25 mL of Visipaque dye at 12 mL/second. The overall LVEF estimated  60 % Without wall motion abnormalities  IMPRESSION:Mrs. Muser has normal coronary arteries and normal LV function. Her chest pain was noncardiac. A femoral angiogram was performed with the intention to close her  femoral puncture site with a "minx closure device" however I puncture the origin of the profunda therefore making that not possible. Patient will be gently hydrated, remain recumbent for 4 hours and then will be discharged home. She will follow-up with the mid-level  provider in one to 2 weeks after that when necessary. She'll be treated with antireflux measures.  Runell Gess MD, [facility] 08/26/2014     Hr 60, rr 14, pulse ox 100%. Pt w chest wall tenderness, reproducing symptoms.   Recheck pt comfortable. No distress. Symptoms and exam appear most c/w chest wall pain.  Pt currently appears stable for d/c.       Cathren Laine, MD 09/03/15 2000

## 2015-09-03 NOTE — Discharge Instructions (Signed)
It was our pleasure to provide your ER care today - we hope that you feel better.  Take motrin or aleve as need for pain. You may also take hydrocodone as need for pain. No driving when taking hydrocodone. Also, do not take tylenol or acetaminophen containing medication when taking hydrocodone.  Follow up with primary care doctor in the next few days.  Return to ER if worse, new symptoms, fevers, trouble breathing, persistent/recurrent chest pain, other concern.  You were given pain medication in the ER - no driving for the next 4 hours.      Nonspecific Chest Pain  Chest pain can be caused by many different conditions. There is always a chance that your pain could be related to something serious, such as a heart attack or a blood clot in your lungs. Chest pain can also be caused by conditions that are not life-threatening. If you have chest pain, it is very important to follow up with your health care provider. CAUSES  Chest pain can be caused by:  Heartburn.  Pneumonia or bronchitis.  Anxiety or stress.  Inflammation around your heart (pericarditis) or lung (pleuritis or pleurisy).  A blood clot in your lung.  A collapsed lung (pneumothorax). It can develop suddenly on its own (spontaneous pneumothorax) or from trauma to the chest.  Shingles infection (varicella-zoster virus).  Heart attack.  Damage to the bones, muscles, and cartilage that make up your chest wall. This can include:  Bruised bones due to injury.  Strained muscles or cartilage due to frequent or repeated coughing or overwork.  Fracture to one or more ribs.  Sore cartilage due to inflammation (costochondritis). RISK FACTORS  Risk factors for chest pain may include:  Activities that increase your risk for trauma or injury to your chest.  Respiratory infections or conditions that cause frequent coughing.  Medical conditions or overeating that can cause heartburn.  Heart disease or family history of  heart disease.  Conditions or health behaviors that increase your risk of developing a blood clot.  Having had chicken pox (varicella zoster). SIGNS AND SYMPTOMS Chest pain can feel like:  Burning or tingling on the surface of your chest or deep in your chest.  Crushing, pressure, aching, or squeezing pain.  Dull or sharp pain that is worse when you move, cough, or take a deep breath.  Pain that is also felt in your back, neck, shoulder, or arm, or pain that spreads to any of these areas. Your chest pain may come and go, or it may stay constant. DIAGNOSIS Lab tests or other studies may be needed to find the cause of your pain. Your health care provider may have you take a test called an ambulatory ECG (electrocardiogram). An ECG records your heartbeat patterns at the time the test is performed. You may also have other tests, such as:  Transthoracic echocardiogram (TTE). During echocardiography, sound waves are used to create a picture of all of the heart structures and to look at how blood flows through your heart.  Transesophageal echocardiogram (TEE).This is a more advanced imaging test that obtains images from inside your body. It allows your health care provider to see your heart in finer detail.  Cardiac monitoring. This allows your health care provider to monitor your heart rate and rhythm in real time.  Holter monitor. This is a portable device that records your heartbeat and can help to diagnose abnormal heartbeats. It allows your health care provider to track your heart activity for several  days, if needed.  Stress tests. These can be done through exercise or by taking medicine that makes your heart beat more quickly.  Blood tests.  Imaging tests. TREATMENT  Your treatment depends on what is causing your chest pain. Treatment may include:  Medicines. These may include:  Acid blockers for heartburn.  Anti-inflammatory medicine.  Pain medicine for inflammatory  conditions.  Antibiotic medicine, if an infection is present.  Medicines to dissolve blood clots.  Medicines to treat coronary artery disease.  Supportive care for conditions that do not require medicines. This may include:  Resting.  Applying heat or cold packs to injured areas.  Limiting activities until pain decreases. HOME CARE INSTRUCTIONS  If you were prescribed an antibiotic medicine, finish it all even if you start to feel better.  Avoid any activities that bring on chest pain.  Do not use any tobacco products, including cigarettes, chewing tobacco, or electronic cigarettes. If you need help quitting, ask your health care provider.  Do not drink alcohol.  Take medicines only as directed by your health care provider.  Keep all follow-up visits as directed by your health care provider. This is important. This includes any further testing if your chest pain does not go away.  If heartburn is the cause for your chest pain, you may be told to keep your head raised (elevated) while sleeping. This reduces the chance that acid will go from your stomach into your esophagus.  Make lifestyle changes as directed by your health care provider. These may include:  Getting regular exercise. Ask your health care provider to suggest some activities that are safe for you.  Eating a heart-healthy diet. A registered dietitian can help you to learn healthy eating options.  Maintaining a healthy weight.  Managing diabetes, if necessary.  Reducing stress. SEEK MEDICAL CARE IF:  Your chest pain does not go away after treatment.  You have a rash with blisters on your chest.  You have a fever. SEEK IMMEDIATE MEDICAL CARE IF:   Your chest pain is worse.  You have an increasing cough, or you cough up blood.  You have severe abdominal pain.  You have severe weakness.  You faint.  You have chills.  You have sudden, unexplained chest discomfort.  You have sudden, unexplained  discomfort in your arms, back, neck, or jaw.  You have shortness of breath at any time.  You suddenly start to sweat, or your skin gets clammy.  You feel nauseous or you vomit.  You suddenly feel light-headed or dizzy.  Your heart begins to beat quickly, or it feels like it is skipping beats. These symptoms may represent a serious problem that is an emergency. Do not wait to see if the symptoms will go away. Get medical help right away. Call your local emergency services (911 in the [place] Do not drive yourself to the hospital.   This information is not intended to replace advice given to you by your health care provider. Make sure you discuss any questions you have with your health care provider.   Document Released: 03/03/2005 Document Revised: 06/14/2014 Document Reviewed: 12/28/2013 Elsevier Interactive Patient Education 2016 [employer]    Chest Wall Pain Chest wall pain is pain in or around the bones and muscles of your chest. Sometimes, an injury causes this pain. Sometimes, the cause may not be known. This pain may take several weeks or longer to get better. HOME CARE INSTRUCTIONS  Pay attention to any changes in your symptoms. Take  these actions to help with your pain:   Rest as told by your health care provider.   Avoid activities that cause pain. These include any activities that use your chest muscles or your abdominal and side muscles to lift heavy items.   If directed, apply ice to the painful area:  Put ice in a plastic bag.  Place a towel between your skin and the bag.  Leave the ice on for 20 minutes, 2-3 times per day.  Take over-the-counter and prescription medicines only as told by your health care provider.  Do not use tobacco products, including cigarettes, chewing tobacco, and e-cigarettes. If you need help quitting, ask your health care provider.  Keep all follow-up visits as told by your health care provider. This is important. SEEK MEDICAL  CARE IF:  You have a fever.  Your chest pain becomes worse.  You have new symptoms. SEEK IMMEDIATE MEDICAL CARE IF:  You have nausea or vomiting.  You feel sweaty or light-headed.  You have a cough with phlegm (sputum) or you cough up blood.  You develop shortness of breath.   This information is not intended to replace advice given to you by your health care provider. Make sure you discuss any questions you have with your health care provider.   Document Released: 05/24/2005 Document Revised: 02/12/2015 Document Reviewed: 08/19/2014 Elsevier Interactive Patient Education [employer]

## 2015-09-03 NOTE — ED Notes (Signed)
Per ems- today at 1300 while resting she had sudden onset left sided sharp pain, radiating to left arm and left jaw. BP 119/80, EKG SR. Given 324 aspirin, 1 nitro. After nitro BP dropped 59 systolic. CBG 91. Pt is a x 4

## 2015-09-03 NOTE — ED Notes (Signed)
MD at bedside. 

## 2015-09-08 ENCOUNTER — Encounter (HOSPITAL_COMMUNITY)
Admission: RE | Admit: 2015-09-08 | Discharge: 2015-09-08 | Disposition: A | Discharge status: Home / Self Care | Payer: Managed Care, Other (non HMO) | Source: Ambulatory Visit | Attending: Rheumatology | Admitting: Rheumatology

## 2015-09-08 DIAGNOSIS — M069 Rheumatoid arthritis, unspecified: Secondary | ICD-10-CM | POA: Diagnosis not present

## 2015-09-08 MED ORDER — SODIUM CHLORIDE 0.9 % IV SOLN
INTRAVENOUS | Status: DC
  Administered 2015-09-08: 11:00:00 via INTRAVENOUS

## 2015-09-08 MED ORDER — ACETAMINOPHEN 325 MG PO TABS
650.0000 mg | ORAL_TABLET | ORAL | Status: DC
  Administered 2015-09-08: 650 mg via ORAL

## 2015-09-08 MED ORDER — DIPHENHYDRAMINE HCL 25 MG PO CAPS
ORAL_CAPSULE | ORAL | Status: AC
  Filled 2015-09-08: qty 1

## 2015-09-08 MED ORDER — SODIUM CHLORIDE 0.9 % IV SOLN
1000.0000 mg | INTRAVENOUS | Status: DC
  Administered 2015-09-08: 1000 mg via INTRAVENOUS
  Filled 2015-09-08: qty 40

## 2015-09-08 MED ORDER — DIPHENHYDRAMINE HCL 25 MG PO TABS
25.0000 mg | ORAL_TABLET | ORAL | Status: DC
  Administered 2015-09-08: 25 mg via ORAL
  Filled 2015-09-08: qty 1

## 2015-09-08 MED ORDER — ACETAMINOPHEN 325 MG PO TABS
ORAL_TABLET | ORAL | Status: AC
  Filled 2015-09-08: qty 2

## 2015-09-10 ENCOUNTER — Encounter (HOSPITAL_COMMUNITY): Payer: Self-pay

## 2015-09-10 ENCOUNTER — Emergency Department (HOSPITAL_COMMUNITY): Payer: Managed Care, Other (non HMO)

## 2015-09-10 ENCOUNTER — Emergency Department (HOSPITAL_COMMUNITY)
Admission: EM | Admit: 2015-09-10 | Discharge: 2015-09-10 | Disposition: A | Discharge status: Home / Self Care | Payer: Managed Care, Other (non HMO) | Attending: Emergency Medicine | Admitting: Emergency Medicine

## 2015-09-10 DIAGNOSIS — M069 Rheumatoid arthritis, unspecified: Secondary | ICD-10-CM | POA: Insufficient documentation

## 2015-09-10 DIAGNOSIS — R079 Chest pain, unspecified: Secondary | ICD-10-CM

## 2015-09-10 DIAGNOSIS — M797 Fibromyalgia: Secondary | ICD-10-CM | POA: Insufficient documentation

## 2015-09-10 DIAGNOSIS — Z8619 Personal history of other infectious and parasitic diseases: Secondary | ICD-10-CM | POA: Diagnosis not present

## 2015-09-10 DIAGNOSIS — Z7951 Long term (current) use of inhaled steroids: Secondary | ICD-10-CM | POA: Diagnosis not present

## 2015-09-10 DIAGNOSIS — R519 Headache, unspecified: Secondary | ICD-10-CM

## 2015-09-10 DIAGNOSIS — R51 Headache: Secondary | ICD-10-CM | POA: Insufficient documentation

## 2015-09-10 DIAGNOSIS — Z8719 Personal history of other diseases of the digestive system: Secondary | ICD-10-CM | POA: Insufficient documentation

## 2015-09-10 DIAGNOSIS — F419 Anxiety disorder, unspecified: Secondary | ICD-10-CM | POA: Insufficient documentation

## 2015-09-10 DIAGNOSIS — F329 Major depressive disorder, single episode, unspecified: Secondary | ICD-10-CM | POA: Insufficient documentation

## 2015-09-10 DIAGNOSIS — Z79899 Other long term (current) drug therapy: Secondary | ICD-10-CM | POA: Diagnosis not present

## 2015-09-10 DIAGNOSIS — E785 Hyperlipidemia, unspecified: Secondary | ICD-10-CM | POA: Diagnosis not present

## 2015-09-10 LAB — CBC
HEMATOCRIT: 41.7 % (ref 36.0–46.0)
Hemoglobin: 13.8 g/dL (ref 12.0–15.0)
MCH: 27.2 pg (ref 26.0–34.0)
MCHC: 33.1 g/dL (ref 30.0–36.0)
MCV: 82.1 fL (ref 78.0–100.0)
Platelets: 280 10*3/uL (ref 150–400)
RBC: 5.08 MIL/uL (ref 3.87–5.11)
RDW: 12.6 % (ref 11.5–15.5)
WBC: 9.7 10*3/uL (ref 4.0–10.5)

## 2015-09-10 LAB — I-STAT TROPONIN, ED: TROPONIN I, POC: 0 ng/mL (ref 0.00–0.08)

## 2015-09-10 LAB — BASIC METABOLIC PANEL
ANION GAP: 7 (ref 5–15)
BUN: 12 mg/dL (ref 6–20)
CHLORIDE: 108 mmol/L (ref 101–111)
CO2: 26 mmol/L (ref 22–32)
Calcium: 8.9 mg/dL (ref 8.9–10.3)
Creatinine, Ser: 0.93 mg/dL (ref 0.44–1.00)
GFR calc Af Amer: >60 mL/min (ref >60)
GFR calc non Af Amer: >60 mL/min (ref >60)
Glucose, Bld: 91 mg/dL (ref 65–99)
Potassium: 3.9 mmol/L (ref 3.5–5.1)
Sodium: 141 mmol/L (ref 135–145)

## 2015-09-10 MED ORDER — DIPHENHYDRAMINE HCL 50 MG/ML IJ SOLN
25.0000 mg | Freq: Once | INTRAMUSCULAR | Status: AC
  Administered 2015-09-10: 25 mg via INTRAMUSCULAR
  Filled 2015-09-10: qty 1

## 2015-09-10 MED ORDER — KETOROLAC TROMETHAMINE 60 MG/2ML IM SOLN
60.0000 mg | Freq: Once | INTRAMUSCULAR | Status: AC
  Administered 2015-09-10: 60 mg via INTRAMUSCULAR
  Filled 2015-09-10: qty 2

## 2015-09-10 MED ORDER — METOCLOPRAMIDE HCL 5 MG/ML IJ SOLN
10.0000 mg | Freq: Once | INTRAMUSCULAR | Status: AC
Start: 2015-09-10 — End: 2015-09-10
  Administered 2015-09-10: 10 mg via INTRAMUSCULAR
  Filled 2015-09-10: qty 2

## 2015-09-10 NOTE — Discharge Instructions (Signed)
General Headache Without Cause °A headache is pain or discomfort felt around the head or neck area. The specific cause of a headache may not be found. There are many causes and types of headaches. A few common ones are: °· Tension headaches. °· Migraine headaches. °· Cluster headaches. °· Chronic daily headaches. °HOME CARE INSTRUCTIONS  °Watch your condition for any changes. Take these steps to help with your condition: °Managing Pain °· Take over-the-counter and prescription medicines only as told by your health care provider. °· Lie down in a dark, quiet room when you have a headache. °· If directed, apply ice to the head and neck area: °· Put ice in a plastic bag. °· Place a towel between your skin and the bag. °· Leave the ice on for 20 minutes, 2-3 times per day. °· Use a heating pad or hot shower to apply heat to the head and neck area as told by your health care provider. °· Keep lights dim if bright lights bother you or make your headaches worse. °Eating and Drinking °· Eat meals on a regular schedule. °· Limit alcohol use. °· Decrease the amount of caffeine you drink, or stop drinking caffeine. °General Instructions °· Keep all follow-up visits as told by your health care provider. This is important. °· Keep a headache journal to help find out what may trigger your headaches. For example, write down: °· What you eat and drink. °· How much sleep you get. °· Any change to your diet or medicines. °· Try massage or other relaxation techniques. °· Limit stress. °· Sit up straight, and do not tense your muscles. °· Do not use tobacco products, including cigarettes, chewing tobacco, or e-cigarettes. If you need help quitting, ask your health care provider. °· Exercise regularly as told by your health care provider. °· Sleep on a regular schedule. Get 7-9 hours of sleep, or the amount recommended by your health care provider. °SEEK MEDICAL CARE IF:  °· Your symptoms are not helped by medicine. °· You have a  headache that is different from the usual headache. °· You have nausea or you vomit. °· You have a fever. °SEEK IMMEDIATE MEDICAL CARE IF:  °· Your headache becomes severe. °· You have repeated vomiting. °· You have a stiff neck. °· You have a loss of vision. °· You have problems with speech. °· You have pain in the eye or ear. °· You have muscular weakness or loss of muscle control. °· You lose your balance or have trouble walking. °· You feel faint or pass out. °· You have confusion. °  °This information is not intended to replace advice given to you by your health care provider. Make sure you discuss any questions you have with your health care provider. °  °Document Released: 05/24/2005 Document Revised: 02/12/2015 Document Reviewed: 09/16/2014 °Elsevier Interactive Patient Education ©2016 [employer] ° °Nonspecific Chest Pain  °Chest pain can be caused by many different conditions. There is always a chance that your pain could be related to something serious, such as a heart attack or a blood clot in your lungs. Chest pain can also be caused by conditions that are not life-threatening. If you have chest pain, it is very important to follow up with your health care provider. °CAUSES  °Chest pain can be caused by: °· Heartburn. °· Pneumonia or bronchitis. °· Anxiety or stress. °· Inflammation around your heart (pericarditis) or lung (pleuritis or pleurisy). °· A blood clot in your lung. °· A collapsed   lung (pneumothorax). It can develop suddenly on its own (spontaneous pneumothorax) or from trauma to the chest. °· Shingles infection (varicella-zoster virus). °· Heart attack. °· Damage to the bones, muscles, and cartilage that make up your chest wall. This can include: °¨ Bruised bones due to injury. °¨ Strained muscles or cartilage due to frequent or repeated coughing or overwork. °¨ Fracture to one or more ribs. °¨ Sore cartilage due to inflammation (costochondritis). °RISK FACTORS  °Risk factors for chest  pain may include: °· Activities that increase your risk for trauma or injury to your chest. °· Respiratory infections or conditions that cause frequent coughing. °· Medical conditions or overeating that can cause heartburn. °· Heart disease or family history of heart disease. °· Conditions or health behaviors that increase your risk of developing a blood clot. °· Having had chicken pox (varicella zoster). °SIGNS AND SYMPTOMS °Chest pain can feel like: °· Burning or tingling on the surface of your chest or deep in your chest. °· Crushing, pressure, aching, or squeezing pain. °· Dull or sharp pain that is worse when you move, cough, or take a deep breath. °· Pain that is also felt in your back, neck, shoulder, or arm, or pain that spreads to any of these areas. °Your chest pain may come and go, or it may stay constant. °DIAGNOSIS °Lab tests or other studies may be needed to find the cause of your pain. Your health care provider may have you take a test called an ambulatory ECG (electrocardiogram). An ECG records your heartbeat patterns at the time the test is performed. You may also have other tests, such as: °· Transthoracic echocardiogram (TTE). During echocardiography, sound waves are used to create a picture of all of the heart structures and to look at how blood flows through your heart. °· Transesophageal echocardiogram (TEE). This is a more advanced imaging test that obtains images from inside your body. It allows your health care provider to see your heart in finer detail. °· Cardiac monitoring. This allows your health care provider to monitor your heart rate and rhythm in real time. °· Holter monitor. This is a portable device that records your heartbeat and can help to diagnose abnormal heartbeats. It allows your health care provider to track your heart activity for several days, if needed. °· Stress tests. These can be done through exercise or by taking medicine that makes your heart beat more  quickly. °· Blood tests. °· Imaging tests. °TREATMENT  °Your treatment depends on what is causing your chest pain. Treatment may include: °· Medicines. These may include: °¨ Acid blockers for heartburn. °¨ Anti-inflammatory medicine. °¨ Pain medicine for inflammatory conditions. °¨ Antibiotic medicine, if an infection is present. °¨ Medicines to dissolve blood clots. °¨ Medicines to treat coronary artery disease. °· Supportive care for conditions that do not require medicines. This may include: °¨ Resting. °¨ Applying heat or cold packs to injured areas. °¨ Limiting activities until pain decreases. °HOME CARE INSTRUCTIONS °· If you were prescribed an antibiotic medicine, finish it all even if you start to feel better. °· Avoid any activities that bring on chest pain. °· Do not use any tobacco products, including cigarettes, chewing tobacco, or electronic cigarettes. If you need help quitting, ask your health care provider. °· Do not drink alcohol. °· Take medicines only as directed by your health care provider. °· Keep all follow-up visits as directed by your health care provider. This is important. This includes any further testing if your chest pain   does not go away. °· If heartburn is the cause for your chest pain, you may be told to keep your head raised (elevated) while sleeping. This reduces the chance that acid will go from your stomach into your esophagus. °· Make lifestyle changes as directed by your health care provider. These may include: °¨ Getting regular exercise. Ask your health care provider to suggest some activities that are safe for you. °¨ Eating a heart-healthy diet. A registered dietitian can help you to learn healthy eating options. °¨ Maintaining a healthy weight. °¨ Managing diabetes, if necessary. °¨ Reducing stress. °SEEK MEDICAL CARE IF: °· Your chest pain does not go away after treatment. °· You have a rash with blisters on your chest. °· You have a fever. °SEEK IMMEDIATE MEDICAL CARE  IF:  °· Your chest pain is worse. °· You have an increasing cough, or you cough up blood. °· You have severe abdominal pain. °· You have severe weakness. °· You faint. °· You have chills. °· You have sudden, unexplained chest discomfort. °· You have sudden, unexplained discomfort in your arms, back, neck, or jaw. °· You have shortness of breath at any time. °· You suddenly start to sweat, or your skin gets clammy. °· You feel nauseous or you vomit. °· You suddenly feel light-headed or dizzy. °· Your heart begins to beat quickly, or it feels like it is skipping beats. °These symptoms may represent a serious problem that is an emergency. Do not wait to see if the symptoms will go away. Get medical help right away. Call your local emergency services (911 in the [place] Do not drive yourself to the hospital. °  °This information is not intended to replace advice given to you by your health care provider. Make sure you discuss any questions you have with your health care provider. °  °Document Released: 03/03/2005 Document Revised: 06/14/2014 Document Reviewed: 12/28/2013 °Elsevier Interactive Patient Education ©2016 [employer] ° °

## 2015-09-10 NOTE — ED Notes (Signed)
Pt here with chest pain up left neck.  Pt had same last week.  Had some cough but denies worsening.  Pt states no fever.  Some shortness of breath.

## 2015-09-10 NOTE — ED Provider Notes (Signed)
CSN: [id number]     Arrival date & time 09/10/15  1723 History   First MD Initiated Contact with Patient 09/10/15 1915     Chief Complaint  Patient presents with  . Chest Pain     (Consider location/radiation/quality/duration/timing/severity/associated sxs/prior Treatment) HPI A reports that she has chest pain that she thinks is "" inflammation in her chest wall." She reported achy in the center of her chest and worse when she moves. She also reports she feels like her heart has been beating "out of her chest". She reports at one point it made her feel lightheaded like she would pass out. That was earlier today. Patient also reports that she has a migraine headache. She has a left-sided headache that is sharp and radiates down towards her face. It is aching and sharp in quality. She reports some nausea but has not had any vomiting. No gait instability. No focal weakness numbness or tingling. Reports she's had problems with headaches in the past and this was several years since she's had a headache this bad. The fevers, no chills no neck stiffness. Also feels that she has had some sinus pressure. Past Medical History  Diagnosis Date  . Rheumatoid arthritis(714.0)   . Depression   . Morbid obesity (HCC)   . HSV-1 infection   . Diastolic dysfunction   . Fibromyalgia   . Hiatal hernia   . Esophageal ring   . Dyslipidemia 01/30/2015  . Coronary artery calcification seen on CAT scan     minimal CAD with 30% prox and mild LAD  . Anxiety    Past Surgical History  Procedure Laterality Date  . Cesarean section    . Tubal ligation    . Laparoscopic gastric sleeve resection  11/21/12    [facility]  . Left heart catheterization with coronary angiogram N/A 08/26/2014    Procedure: LEFT HEART CATHETERIZATION WITH CORONARY ANGIOGRAM;  Surgeon: Runell Gess, MD;  Location[facility] CATH LAB;  Service: Cardiovascular;  Laterality: N/A;  . Esophageal manometry N/A 10/28/2014    Procedure: ESOPHAGEAL  MANOMETRY (EM);  Surgeon: Hilarie Fredrickson, MD;  Location: [facility] ENDOSCOPY;  Service: Endoscopy;  Laterality: N/A;  . Hernia repair      reports surgery on 3 hernias, with 2 more present   Family History  Problem Relation Age of Onset  . Hyperlipidemia Mother   . Depression Mother   . Sarcoidosis Father   . Lung disease Father     Pleural Mesothelioma  . Cancer Father   . Heart attack Paternal Grandmother   . Hypertension Paternal Grandmother   . Stroke Neg Hx   . Colon cancer Neg Hx   . Esophageal cancer Neg Hx   . Rectal cancer Neg Hx   . Stomach cancer Neg Hx   . Hypertension Maternal Grandmother   . Diabetes Maternal Grandmother    Social History  Substance Use Topics  . Smoking status: Never Smoker   . Smokeless tobacco: Never Used  . Alcohol Use: No   OB History    No data available     Review of Systems  10 Systems reviewed and are negative for acute change except as noted in the HPI.   Allergies  Flu virus vaccine and Influenza vaccines  Home Medications   Prior to Admission medications   Medication Sig Start Date End Date Taking? Authorizing Provider  Adapalene 0.3 % gel Apply 1 application topically daily as needed (acne).  12/31/14  Yes Historical Provider, MD  ARIPiprazole (ABILIFY) 2 MG tablet Take 1 tablet (2 mg total) by mouth daily. 04/22/15  Yes Benjaman Pott, MD  atorvastatin (LIPITOR) 80 MG tablet Take 1 tablet (80 mg total) by mouth daily at 6 PM. 12/28/14  Yes Adonis Brook, NP  Betamethasone Valerate 0.12 % foam Apply 1 application topically daily as needed. Itching 04/22/15  Yes Historical Provider, MD  buPROPion (WELLBUTRIN XL) 300 MG 24 hr tablet Take 1 tablet (300 mg total) by mouth daily. 12/28/14  Yes Adonis Brook, NP  CARAFATE 1 GM/10ML suspension TAKE 10 MLS (1 G TOTAL) BY MOUTH 4 (FOUR) TIMES DAILY - WITH MEALS AND AT BEDTIME. 11/18/14  Yes Historical Provider, MD  clindamycin-benzoyl peroxide (BENZACLIN) gel Apply 1 application topically  daily as needed. Acne 02/13/15  Yes Historical Provider, MD  clobetasol cream (TEMOVATE) 0.05 % Apply 1 application topically daily as needed. Skin discoloration 04/22/15  Yes Historical Provider, MD  cyclobenzaprine (FLEXERIL) 10 MG tablet TAKE 1 TABLET BY MOUTH EVERY 6-8 HOURS AS NEEDED FOR MUSCLE SPASMS 01/22/15  Yes Historical Provider, MD  diclofenac sodium (VOLTAREN) 1 % GEL Apply 2 g topically 2 (two) times daily as needed (pain).  01/28/15  Yes Historical Provider, MD  diltiazem (CARTIA XT) 180 MG 24 hr capsule Take 1 capsule (180 mg total) by mouth daily. 01/01/15  Yes Adonis Brook, NP  doxepin (SINEQUAN) 10 MG capsule Take 1 capsule (10 mg total) by mouth at bedtime. Patient taking differently: Take 10 mg by mouth at bedtime as needed (sleep).  12/28/14  Yes Adonis Brook, NP  DULoxetine (CYMBALTA) 30 MG capsule Take 30 mg by mouth 2 (two) times daily.  12/30/14  Yes Historical Provider, MD  famciclovir (FAMVIR) 500 MG tablet TAKE 1 TAB BY MOUTH DAILY FOR PREVENTION OR 3 TABS AT ONSET OF FEVER BLISTER X 1 DAY 01/21/15  Yes Historical Provider, MD  FLUoxetine (PROZAC) 10 MG capsule Take 10 mg by mouth daily.   Yes Historical Provider, MD  fluticasone (FLONASE) 50 MCG/ACT nasal spray Place 2 sprays into both nostrils as needed for allergies. 12/28/14 12/29/15 Yes Adonis Brook, NP  gabapentin (NEURONTIN) 300 MG capsule Take 1 capsule (300 mg total) by mouth 3 (three) times daily. 04/22/15  Yes Benjaman Pott, MD  HYDROcodone-acetaminophen (NORCO/VICODIN) 5-325 MG tablet Take 1-2 tablets by mouth every 6 (six) hours as needed for moderate pain. 09/03/15  Yes Cathren Laine, MD  hydrOXYzine (VISTARIL) 25 MG capsule Take 25 mg by mouth every 8 (eight) hours as needed for anxiety.  12/30/14  Yes Historical Provider, MD  levothyroxine (SYNTHROID, LEVOTHROID) 25 MCG tablet Take 1 tablet (25 mcg total) by mouth daily before breakfast. 12/28/14  Yes Adonis Brook, NP  lidocaine (LIDODERM) 5 % Place 1 patch  onto the skin daily as needed (pain). Hip 03/29/15  Yes Historical Provider, MD  neomycin-polymyxin-hydrocortisone (CORTISPORIN) 3.5-10000-1 otic suspension Place 1-2 drops into the left ear daily as needed (ear pain).  04/09/15  Yes Historical Provider, MD  pantoprazole (PROTONIX) 40 MG tablet TAKE 1 TABLET (40 MG TOTAL) BY MOUTH 2 (TWO) TIMES DAILY. 03/13/15  Yes Amy S Esterwood, PA-C  tapentadol (NUCYNTA) 50 MG TABS tablet Take 50 mg by mouth every 6 (six) hours as needed for moderate pain.    Yes Historical Provider, MD  Topiramate ER 50 MG CP24 Take 50 mg by mouth daily as needed (migraines). Trokendi XR 12/28/14  Yes Adonis Brook, NP  tretinoin (RETIN-A) 0.025 % cream Apply 1 application topically daily. 02/11/15  Yes Historical Provider, MD  EPIPEN 2-PAK 0.3 MG/0.3ML SOAJ injection Inject 0.3 mg into the skin once.  12/30/14   Historical Provider, MD  OTREXUP 25 MG/0.4ML SOAJ Inject 25 mg into the skin once a week. Tuesday 01/28/15   Historical Provider, MD  triamcinolone cream (KENALOG) 0.1 % Apply 1 application topically daily as needed. Itching 04/13/15   Historical Provider, MD  Vitamin D, Ergocalciferol, (DRISDOL) 50000 UNITS CAPS capsule Take 500,000 Units by mouth 3 (three) times a week. Paulo Fruit, Caleen Essex 01/21/15 01/22/16  Historical Provider, MD  XERESE 5-1 % CREA Apply 1 application topically daily as needed (fever blisters).  12/30/14   Historical Provider, MD   BP 127/97 mmHg  Pulse 63  Temp(Src) 98.4 F (36.9 C) (Oral)  Resp 18  SpO2 100% Physical Exam  Constitutional: She is oriented to person, place, and time.  Patient is morbidly obese and she is alert and nontoxic. Patient is sitting at the edge of the stretcher speaking with family members. She is well in appearance. Respiratory distress.  HENT:  Head: Normocephalic and atraumatic.  Right Ear: External ear normal.  Left Ear: External ear normal.  Nose: Nose normal.  Mouth/Throat: Oropharynx is clear and moist.  Bilateral TMs  normal.   Eyes: EOM are normal. Pupils are equal, round, and reactive to light. Right eye exhibits no discharge. Left eye exhibits no discharge.  Neck: Neck supple.  Cardiovascular: Normal rate, regular rhythm, normal heart sounds and intact distal pulses.   Pulmonary/Chest: Effort normal and breath sounds normal.  Abdominal: Soft. Bowel sounds are normal. She exhibits no distension. There is no tenderness.  Musculoskeletal: Normal range of motion. She exhibits no edema.  Neurological: She is alert and oriented to person, place, and time. She has normal strength. No cranial nerve deficit. She exhibits normal muscle tone. Coordination normal. GCS eye subscore is 4. GCS verbal subscore is 5. GCS motor subscore is 6.  Finger-nose examination bilaterally. Motor exam 5 out of 5 upper and lower extremity. Normal cognitive function.  Skin: Skin is warm, dry and intact.  Psychiatric: She has a normal mood and affect.    ED Course  Procedures (including critical care time) Labs Review Labs Reviewed  BASIC METABOLIC PANEL  CBC  I-STAT TROPOININ, ED    Imaging Review Dg Chest 2 View  09/10/2015  CLINICAL DATA:  Left upper chest pain radiating into neck with cough and shortness of breath. EXAM: CHEST - 2 VIEW COMPARISON:  09/03/2015 FINDINGS: The heart size and mediastinal contours are within normal limits. There is no evidence of pulmonary edema, consolidation, pneumothorax, nodule or pleural fluid. The visualized skeletal structures are unremarkable. IMPRESSION: No active disease. Electronically Signed   By: Irish Lack M.D.   On: 09/10/2015 17:59   I have personally reviewed and evaluated these images and lab results as part of my medical decision-making.   EKG Interpretation None     Recheck: 21:55 a reports pain is much improved after medications. She reports she feels well and ready to go home. MDM   Final diagnoses:  Nonintractable episodic headache, unspecified headache type   Chest pain, unspecified chest pain type   Patient presents with complaint of both chest pain and headache. Chest pain is worse with movement and has associated palpitation. On examination the patient has normal cardiac examination without tachycardia or irregularity. The pressures are normal. The patient has  had a cardiac catheterization, echocardiogram and cardiology consultations for similar symptoms. She does not have underlying  cardiac dysfunction. At this time I do not feel the symptoms are likely to represent ischemia or dysrhythmia. A also reports headache that is left sided. No  Associated neurologic dysfunction, normal blood pressure, no infectious symptomatology. She has gotten excellent relief of discomfort with migraine cocktail. He does describe history of headaches although she has not have problems with them for several years. At this time I feel most likely etiology is migraine. Patient is counseled however to follow-up with her family doctor for recheck. Headache discharge instructions describe signs and symptoms were to return.    Arby Barrette, MD 09/10/15 2203

## 2015-09-12 LAB — QUANTIFERON IN TUBE
QFT TB AG MINUS NIL VALUE: 0.06 IU/mL
QUANTIFERON MITOGEN VALUE: 8.57 IU/mL
QUANTIFERON TB AG VALUE: 0.14 [IU]/mL
QUANTIFERON TB GOLD: NEGATIVE
Quantiferon Nil Value: 0.08 IU/mL

## 2015-09-12 LAB — QUANTIFERON TB GOLD ASSAY (BLOOD)

## 2015-09-16 ENCOUNTER — Other Ambulatory Visit: Payer: Self-pay | Admitting: Physician Assistant

## 2015-10-03 ENCOUNTER — Other Ambulatory Visit (HOSPITAL_COMMUNITY): Payer: Self-pay | Admitting: *Deleted

## 2015-10-06 ENCOUNTER — Ambulatory Visit (HOSPITAL_COMMUNITY)
Admission: RE | Admit: 2015-10-06 | Discharge: 2015-10-06 | Disposition: A | Discharge status: Home / Self Care | Payer: Managed Care, Other (non HMO) | Source: Ambulatory Visit | Attending: Rheumatology | Admitting: Rheumatology

## 2015-10-06 DIAGNOSIS — M069 Rheumatoid arthritis, unspecified: Secondary | ICD-10-CM | POA: Diagnosis present

## 2015-10-06 MED ORDER — SODIUM CHLORIDE 0.9 % IV SOLN
INTRAVENOUS | Status: DC
  Administered 2015-10-06: 11:00:00 via INTRAVENOUS

## 2015-10-06 MED ORDER — ACETAMINOPHEN 325 MG PO TABS
ORAL_TABLET | ORAL | Status: AC
  Administered 2015-10-06: 650 mg via ORAL
  Filled 2015-10-06: qty 2

## 2015-10-06 MED ORDER — ACETAMINOPHEN 325 MG PO TABS
650.0000 mg | ORAL_TABLET | ORAL | Status: DC
  Administered 2015-10-06: 650 mg via ORAL

## 2015-10-06 MED ORDER — SODIUM CHLORIDE 0.9 % IV SOLN
1000.0000 mg | INTRAVENOUS | Status: DC
  Administered 2015-10-06: 1000 mg via INTRAVENOUS
  Filled 2015-10-06: qty 40

## 2015-10-06 MED ORDER — DIPHENHYDRAMINE HCL 25 MG PO CAPS
25.0000 mg | ORAL_CAPSULE | ORAL | Status: DC
Start: 2015-10-06 — End: 2015-10-07
  Administered 2015-10-06: 25 mg via ORAL

## 2015-10-06 MED ORDER — DIPHENHYDRAMINE HCL 25 MG PO CAPS
ORAL_CAPSULE | ORAL | Status: AC
  Administered 2015-10-06: 25 mg via ORAL
  Filled 2015-10-06: qty 1

## 2015-10-09 ENCOUNTER — Other Ambulatory Visit: Payer: Self-pay | Admitting: Physician Assistant

## 2015-10-13 MED ORDER — ATORVASTATIN CALCIUM 80 MG PO TABS: ORAL_TABLET | ORAL | Status: DC

## 2015-10-23 MED FILL — HYDROCODON-APAP 5-325: 5-325 | 2 days supply | Qty: 10 | Fill #0

## 2015-10-23 MED FILL — OTREXUP 25 MG/0.4 ML AUTO-I: 25 | 30 days supply | Qty: 2 | Fill #0

## 2015-10-31 ENCOUNTER — Ambulatory Visit (HOSPITAL_COMMUNITY)
Admission: RE | Admit: 2015-10-31 | Discharge: 2015-10-31 | Disposition: A | Discharge status: Home / Self Care | Payer: Managed Care, Other (non HMO) | Source: Ambulatory Visit | Attending: Rheumatology | Admitting: Rheumatology

## 2015-10-31 DIAGNOSIS — M069 Rheumatoid arthritis, unspecified: Secondary | ICD-10-CM | POA: Insufficient documentation

## 2015-10-31 LAB — CBC WITH DIFFERENTIAL/PLATELET
Basophils Absolute: 0 10*3/uL (ref 0.0–0.1)
Basophils Relative: 1 %
Eosinophils Absolute: 0.2 10*3/uL (ref 0.0–0.7)
Eosinophils Relative: 2 %
HEMATOCRIT: 43 % (ref 36.0–46.0)
HEMOGLOBIN: 13.6 g/dL (ref 12.0–15.0)
LYMPHS ABS: 3.7 10*3/uL (ref 0.7–4.0)
LYMPHS PCT: 42 %
MCH: 26.8 pg (ref 26.0–34.0)
MCHC: 31.6 g/dL (ref 30.0–36.0)
MCV: 84.8 fL (ref 78.0–100.0)
Monocytes Absolute: 0.4 10*3/uL (ref 0.1–1.0)
Monocytes Relative: 5 %
NEUTROS ABS: 4.5 10*3/uL (ref 1.7–7.7)
NEUTROS PCT: 50 %
Platelets: 298 10*3/uL (ref 150–400)
RBC: 5.07 MIL/uL (ref 3.87–5.11)
RDW: 12.4 % (ref 11.5–15.5)
WBC: 8.9 10*3/uL (ref 4.0–10.5)

## 2015-10-31 LAB — COMPREHENSIVE METABOLIC PANEL
ALK PHOS: 77 U/L (ref 38–126)
ALT: 14 U/L (ref 14–54)
AST: 21 U/L (ref 15–41)
Albumin: 3.4 g/dL — ABNORMAL LOW (ref 3.5–5.0)
Anion gap: 7 (ref 5–15)
BUN: 8 mg/dL (ref 6–20)
CALCIUM: 9.4 mg/dL (ref 8.9–10.3)
CO2: 24 mmol/L (ref 22–32)
CREATININE: 0.93 mg/dL (ref 0.44–1.00)
Chloride: 107 mmol/L (ref 101–111)
Glucose, Bld: 101 mg/dL — ABNORMAL HIGH (ref 65–99)
Potassium: 3.3 mmol/L — ABNORMAL LOW (ref 3.5–5.1)
SODIUM: 138 mmol/L (ref 135–145)
Total Bilirubin: 0.4 mg/dL (ref 0.3–1.2)
Total Protein: 6.7 g/dL (ref 6.5–8.1)

## 2015-10-31 MED ORDER — SODIUM CHLORIDE 0.9 % IV SOLN
1000.0000 mg | INTRAVENOUS | Status: DC
  Administered 2015-10-31: 1000 mg via INTRAVENOUS
  Filled 2015-10-31: qty 40

## 2015-10-31 MED ORDER — ACETAMINOPHEN 325 MG PO TABS
ORAL_TABLET | ORAL | Status: AC
  Filled 2015-10-31: qty 2

## 2015-10-31 MED ORDER — DIPHENHYDRAMINE HCL 25 MG PO CAPS
25.0000 mg | ORAL_CAPSULE | ORAL | Status: DC
  Administered 2015-10-31: 25 mg via ORAL

## 2015-10-31 MED ORDER — ACETAMINOPHEN 325 MG PO TABS
650.0000 mg | ORAL_TABLET | ORAL | Status: DC
  Administered 2015-10-31: 650 mg via ORAL

## 2015-10-31 MED ORDER — SODIUM CHLORIDE 0.9 % IV SOLN
INTRAVENOUS | Status: DC
  Administered 2015-10-31: 13:00:00 via INTRAVENOUS

## 2015-10-31 MED ORDER — DIPHENHYDRAMINE HCL 25 MG PO CAPS
ORAL_CAPSULE | ORAL | Status: AC
  Filled 2015-10-31: qty 1

## 2015-12-01 ENCOUNTER — Other Ambulatory Visit (HOSPITAL_COMMUNITY): Payer: Self-pay | Admitting: *Deleted

## 2015-12-02 ENCOUNTER — Encounter (HOSPITAL_COMMUNITY)
Admission: RE | Admit: 2015-12-02 | Discharge: 2015-12-02 | Disposition: A | Discharge status: Home / Self Care | Payer: Managed Care, Other (non HMO) | Source: Ambulatory Visit | Attending: Rheumatology | Admitting: Rheumatology

## 2015-12-02 DIAGNOSIS — M069 Rheumatoid arthritis, unspecified: Secondary | ICD-10-CM | POA: Diagnosis present

## 2015-12-02 MED ORDER — DIPHENHYDRAMINE HCL 25 MG PO CAPS
25.0000 mg | ORAL_CAPSULE | ORAL | Status: DC
  Administered 2015-12-02: 25 mg via ORAL

## 2015-12-02 MED ORDER — SODIUM CHLORIDE 0.9 % IV SOLN
INTRAVENOUS | Status: DC
  Administered 2015-12-02: 12:00:00 via INTRAVENOUS

## 2015-12-02 MED ORDER — ACETAMINOPHEN 325 MG PO TABS
ORAL_TABLET | ORAL | Status: AC
  Filled 2015-12-02: qty 2

## 2015-12-02 MED ORDER — ACETAMINOPHEN 325 MG PO TABS
650.0000 mg | ORAL_TABLET | ORAL | Status: DC
Start: 2015-12-02 — End: 2015-12-03
  Administered 2015-12-02: 650 mg via ORAL

## 2015-12-02 MED ORDER — DIPHENHYDRAMINE HCL 25 MG PO CAPS
ORAL_CAPSULE | ORAL | Status: AC
  Administered 2015-12-02: 25 mg via ORAL
  Filled 2015-12-02: qty 1

## 2015-12-02 MED ORDER — SODIUM CHLORIDE 0.9 % IV SOLN
1000.0000 mg | INTRAVENOUS | Status: DC
  Administered 2015-12-02: 1000 mg via INTRAVENOUS
  Filled 2015-12-02: qty 40

## 2015-12-25 ENCOUNTER — Encounter: Payer: Self-pay | Admitting: Cardiology

## 2015-12-31 ENCOUNTER — Encounter (HOSPITAL_COMMUNITY)
Admission: RE | Admit: 2015-12-31 | Discharge: 2015-12-31 | Disposition: A | Discharge status: Home / Self Care | Payer: Managed Care, Other (non HMO) | Source: Ambulatory Visit | Attending: Rheumatology | Admitting: Rheumatology

## 2015-12-31 DIAGNOSIS — M069 Rheumatoid arthritis, unspecified: Secondary | ICD-10-CM | POA: Diagnosis present

## 2015-12-31 LAB — COMPREHENSIVE METABOLIC PANEL
ALT: 14 U/L (ref 14–54)
ANION GAP: 5 (ref 5–15)
AST: 19 U/L (ref 15–41)
Albumin: 3.1 g/dL — ABNORMAL LOW (ref 3.5–5.0)
Alkaline Phosphatase: 68 U/L (ref 38–126)
BUN: 7 mg/dL (ref 6–20)
CHLORIDE: 108 mmol/L (ref 101–111)
CO2: 26 mmol/L (ref 22–32)
Calcium: 8.8 mg/dL — ABNORMAL LOW (ref 8.9–10.3)
Creatinine, Ser: 0.79 mg/dL (ref 0.44–1.00)
GFR calc non Af Amer: >60 mL/min (ref >60)
Glucose, Bld: 90 mg/dL (ref 65–99)
Potassium: 3.8 mmol/L (ref 3.5–5.1)
SODIUM: 139 mmol/L (ref 135–145)
Total Bilirubin: 0.5 mg/dL (ref 0.3–1.2)
Total Protein: 6.1 g/dL — ABNORMAL LOW (ref 6.5–8.1)

## 2015-12-31 LAB — CBC
HEMATOCRIT: 42.2 % (ref 36.0–46.0)
Hemoglobin: 13.1 g/dL (ref 12.0–15.0)
MCH: 26.5 pg (ref 26.0–34.0)
MCHC: 31 g/dL (ref 30.0–36.0)
MCV: 85.4 fL (ref 78.0–100.0)
PLATELETS: 306 10*3/uL (ref 150–400)
RBC: 4.94 MIL/uL (ref 3.87–5.11)
RDW: 12.7 % (ref 11.5–15.5)
WBC: 8.7 10*3/uL (ref 4.0–10.5)

## 2015-12-31 LAB — DIFFERENTIAL
BASOS PCT: 0 %
Basophils Absolute: 0 10*3/uL (ref 0.0–0.1)
EOS PCT: 3 %
Eosinophils Absolute: 0.2 10*3/uL (ref 0.0–0.7)
Lymphocytes Relative: 45 %
Lymphs Abs: 3.9 10*3/uL (ref 0.7–4.0)
MONO ABS: 0.6 10*3/uL (ref 0.1–1.0)
Monocytes Relative: 7 %
NEUTROS ABS: 4 10*3/uL (ref 1.7–7.7)
NEUTROS PCT: 45 %

## 2015-12-31 MED ORDER — ACETAMINOPHEN 325 MG PO TABS
ORAL_TABLET | ORAL | Status: AC
  Filled 2015-12-31: qty 2

## 2015-12-31 MED ORDER — SODIUM CHLORIDE 0.9 % IV SOLN
INTRAVENOUS | Status: DC
  Administered 2015-12-31: 12:00:00 via INTRAVENOUS

## 2015-12-31 MED ORDER — DIPHENHYDRAMINE HCL 25 MG PO CAPS
ORAL_CAPSULE | ORAL | Status: AC
  Filled 2015-12-31: qty 1

## 2015-12-31 MED ORDER — SODIUM CHLORIDE 0.9 % IV SOLN
1000.0000 mg | INTRAVENOUS | Status: DC
  Administered 2015-12-31: 1000 mg via INTRAVENOUS
  Filled 2015-12-31: qty 40

## 2015-12-31 MED ORDER — DIPHENHYDRAMINE HCL 25 MG PO TABS
25.0000 mg | ORAL_TABLET | Freq: Once | ORAL | Status: AC
  Administered 2015-12-31: 25 mg via ORAL
  Filled 2015-12-31: qty 1

## 2015-12-31 MED ORDER — ACETAMINOPHEN 325 MG PO TABS
650.0000 mg | ORAL_TABLET | ORAL | Status: DC
  Administered 2015-12-31: 650 mg via ORAL

## 2016-01-02 MED FILL — OTREXUP 25 MG/0.4 ML AUTO-I: 25 | 30 days supply | Qty: 2 | Fill #1

## 2016-01-05 DIAGNOSIS — M533 Sacrococcygeal disorders, not elsewhere classified: Secondary | ICD-10-CM | POA: Insufficient documentation

## 2016-01-24 ENCOUNTER — Encounter: Payer: Self-pay | Admitting: Pulmonary Disease

## 2016-01-24 ENCOUNTER — Encounter: Payer: Self-pay | Admitting: Radiology

## 2016-01-24 DIAGNOSIS — M069 Rheumatoid arthritis, unspecified: Secondary | ICD-10-CM | POA: Insufficient documentation

## 2016-01-24 DIAGNOSIS — M0579 Rheumatoid arthritis with rheumatoid factor of multiple sites without organ or systems involvement: Secondary | ICD-10-CM

## 2016-01-28 ENCOUNTER — Ambulatory Visit (HOSPITAL_COMMUNITY)
Admission: RE | Admit: 2016-01-28 | Discharge: 2016-01-28 | Disposition: A | Discharge status: Home / Self Care | Payer: Managed Care, Other (non HMO) | Source: Ambulatory Visit | Attending: Rheumatology | Admitting: Rheumatology

## 2016-01-28 DIAGNOSIS — M0579 Rheumatoid arthritis with rheumatoid factor of multiple sites without organ or systems involvement: Secondary | ICD-10-CM | POA: Insufficient documentation

## 2016-01-28 MED ORDER — ACETAMINOPHEN 325 MG PO TABS
ORAL_TABLET | ORAL | Status: AC
  Filled 2016-01-28: qty 2

## 2016-01-28 MED ORDER — SODIUM CHLORIDE 0.9 % IV SOLN
1000.0000 mg | INTRAVENOUS | Status: DC
  Administered 2016-01-28: 1000 mg via INTRAVENOUS
  Filled 2016-01-28: qty 40

## 2016-01-28 MED ORDER — ACETAMINOPHEN 325 MG PO TABS
650.0000 mg | ORAL_TABLET | ORAL | Status: DC
  Administered 2016-01-28: 650 mg via ORAL

## 2016-01-28 MED ORDER — DIPHENHYDRAMINE HCL 25 MG PO CAPS
ORAL_CAPSULE | ORAL | Status: AC
  Filled 2016-01-28: qty 1

## 2016-01-28 MED ORDER — SODIUM CHLORIDE 0.9 % IV SOLN
INTRAVENOUS | Status: DC
  Administered 2016-01-28: 13:00:00 via INTRAVENOUS

## 2016-01-28 MED ORDER — DIPHENHYDRAMINE HCL 25 MG PO TABS
25.0000 mg | ORAL_TABLET | ORAL | Status: DC
  Administered 2016-01-28: 25 mg via ORAL
  Filled 2016-01-28: qty 1

## 2016-02-03 ENCOUNTER — Other Ambulatory Visit: Payer: Self-pay | Admitting: Physician Assistant

## 2016-02-05 ENCOUNTER — Ambulatory Visit: Payer: Self-pay | Admitting: Cardiology

## 2016-02-10 ENCOUNTER — Encounter: Payer: Self-pay | Admitting: Cardiology

## 2016-02-13 ENCOUNTER — Other Ambulatory Visit: Payer: Self-pay | Admitting: Physician Assistant

## 2016-02-17 ENCOUNTER — Emergency Department (HOSPITAL_BASED_OUTPATIENT_CLINIC_OR_DEPARTMENT_OTHER): Payer: Managed Care, Other (non HMO)

## 2016-02-17 ENCOUNTER — Emergency Department (HOSPITAL_BASED_OUTPATIENT_CLINIC_OR_DEPARTMENT_OTHER)
Admission: EM | Admit: 2016-02-17 | Discharge: 2016-02-17 | Disposition: A | Discharge status: Home / Self Care | Payer: Managed Care, Other (non HMO) | Attending: Emergency Medicine | Admitting: Emergency Medicine

## 2016-02-17 ENCOUNTER — Encounter (HOSPITAL_BASED_OUTPATIENT_CLINIC_OR_DEPARTMENT_OTHER): Payer: Self-pay

## 2016-02-17 DIAGNOSIS — I2510 Atherosclerotic heart disease of native coronary artery without angina pectoris: Secondary | ICD-10-CM | POA: Insufficient documentation

## 2016-02-17 DIAGNOSIS — Z792 Long term (current) use of antibiotics: Secondary | ICD-10-CM | POA: Diagnosis not present

## 2016-02-17 DIAGNOSIS — J069 Acute upper respiratory infection, unspecified: Secondary | ICD-10-CM | POA: Insufficient documentation

## 2016-02-17 DIAGNOSIS — I10 Essential (primary) hypertension: Secondary | ICD-10-CM | POA: Diagnosis not present

## 2016-02-17 DIAGNOSIS — Z791 Long term (current) use of non-steroidal anti-inflammatories (NSAID): Secondary | ICD-10-CM | POA: Diagnosis not present

## 2016-02-17 DIAGNOSIS — Z79899 Other long term (current) drug therapy: Secondary | ICD-10-CM | POA: Insufficient documentation

## 2016-02-17 DIAGNOSIS — R51 Headache: Secondary | ICD-10-CM | POA: Diagnosis present

## 2016-02-17 MED ORDER — FLUCONAZOLE 50 MG PO TABS
150.0000 mg | ORAL_TABLET | Freq: Once | ORAL | Status: AC
  Administered 2016-02-17: 150 mg via ORAL
  Filled 2016-02-17: qty 1

## 2016-02-17 MED FILL — OTREXUP 25 MG/0.4 ML AUTO-I: 25 | 30 days supply | Qty: 2 | Fill #2

## 2016-02-17 NOTE — ED Notes (Addendum)
Pt reports headache, congestion, sinus pressure, tightness in chest while breathing in.  Pt has hx of RA and told MD she was worried about that as well.  Pt c/o palpitations, about three times a day, that started about a month ago.  MD notified.

## 2016-02-17 NOTE — ED Provider Notes (Signed)
[facility] Provider Note   CSN: [id number] Arrival date & time: 02/17/16  1126     History   Chief Complaint Chief Complaint  Patient presents with  . Headache    HPI [name] [name] is a 50 y.o. female.  The history is provided by the patient and a relative.  Headache   Associated symptoms include palpitations.  Patient presents with her daughter. Daughter presents with URI type symptoms had a syncopal episode. Patient states that she has had headache congestion some nausea vomiting diarrhea. States she has a dull headache. History of rheumatoid arthritis. No fevers but has had some chills. Also has occasional palpitations. Began around a month ago. Has cough with occasional sputum. She's had a mildly decreased oral intake. Patient also thinks she may have a vaginal yeast infection. Has had some discharge and itching. Has had yeast infections the past and states it feels like this.  Past Medical History:  Diagnosis Date  . Anxiety   . Coronary artery calcification seen on CAT scan    minimal CAD with 30% prox and mild LAD  . Depression   . Diastolic dysfunction   . Dyslipidemia 01/30/2015  . Esophageal ring   . Fibromyalgia   . Hiatal hernia   . HSV-1 infection   . Morbid obesity (HCC)   . Rheumatoid arthritis(714.0)    M05.79  . Rheumatoid arthritis, seropositive, 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HCC)    Treated with Orencia, TB neg 09/12/2015    Patient Active Problem List   Diagnosis Date Noted  . Rheumatoid arthritis with rheumatoid factor of multiple sites without organ or systems involvement (HCC) 01/24/2016  . Dyslipidemia 01/30/2015  . 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 Essential hypertension 08/25/2014  . Morbid obesity (HCC) 08/25/2014  . CAD (coronary artery disease), native coronary artery 08/24/2014  . Numbness and tingling in left arm   . Arm numbness left 08/22/2014  . Chest pain 08/22/2014  .  Coronary artery calcification seen on CAT scan 02/19/2014  . Solitary pulmonary nodule 08/28/2013  . Chest pain, atypical 07/18/2013  . Heart palpitations 06/27/2013  . Cough 06/04/2013  . Diastolic dysfunction 05/18/2013  . SOB (shortness of breath) 05/09/2013  . Other malaise and fatigue 01/21/2013  . Stress and adjustment reaction 12/09/2012  . Right sided abdominal pain 11/29/2012    Past Surgical History:  Procedure Laterality Date  . CESAREAN SECTION    . ESOPHAGEAL MANOMETRY N/A 10/28/2014   Procedure: ESOPHAGEAL MANOMETRY (EM);  Surgeon: Hilarie Fredrickson, MD;  Location: [facility];  Service: Endoscopy;  Laterality: N/A;  . HERNIA REPAIR     reports surgery on 3 hernias, with 2 more present  . LAPAROSCOPIC GASTRIC SLEEVE RESECTION  11/21/12   [facility]  . LEFT HEART CATHETERIZATION WITH CORONARY ANGIOGRAM N/A 08/26/2014   Procedure: LEFT HEART CATHETERIZATION WITH CORONARY ANGIOGRAM;  Surgeon: Runell Gess, MD;  Location: [facility] CATH LAB;  Service: Cardiovascular;  Laterality: N/A;  . TUBAL LIGATION      OB History    No data available       Home Medications    Prior to Admission medications   Medication Sig Start Date End Date Taking? Authorizing Provider  Abatacept (ORENCIA IV) Inject 1,000 mg into the vein every 30 (thirty) days. INFUSE OVER 30 MINUTES EVERY 4 WEEKS 1000MG    Yes , MD  Adapalene 0.3 % gel Apply 1 application topically daily as needed (acne).  12/31/14  Yes Historical Provider, MD  ARIPiprazole (ABILIFY) 2 MG tablet Take 1 tablet (2 mg total) by mouth daily. 04/22/15  Yes Benjaman Pott, MD  atorvastatin (LIPITOR) 80 MG tablet TAKE 1 TABLET BY MOUTH EVERY DAY AT 6PM 10/13/15  Yes Quintella Reichert, MD  Betamethasone Valerate 0.12 % foam Apply 1 application topically daily as needed. Itching 04/22/15  Yes Historical Provider, MD  buPROPion (WELLBUTRIN XL) 300 MG 24 hr tablet Take 1 tablet (300 mg total) by mouth daily. 12/28/14  Yes Adonis Brook, NP  CARAFATE 1 GM/10ML suspension TAKE 10 MLS (1 G TOTAL) BY MOUTH 4 (FOUR) TIMES DAILY - WITH MEALS AND AT BEDTIME. 11/18/14  Yes Historical Provider, MD  clindamycin-benzoyl peroxide (BENZACLIN) gel Apply 1 application topically daily as needed. Acne 02/13/15  Yes Historical Provider, MD  clobetasol cream (TEMOVATE) 0.05 % Apply 1 application topically daily as needed. Skin discoloration 04/22/15  Yes Historical Provider, MD  cyclobenzaprine (FLEXERIL) 10 MG tablet TAKE 1 TABLET BY MOUTH EVERY 6-8 HOURS AS NEEDED FOR MUSCLE SPASMS 01/22/15  Yes Historical Provider, MD  diclofenac sodium (VOLTAREN) 1 % GEL Apply 2 g topically 2 (two) times daily as needed (pain).  01/28/15  Yes Historical Provider, MD  diltiazem (CARTIA XT) 180 MG 24 hr capsule Take 1 capsule (180 mg total) by mouth daily. 01/01/15  Yes Adonis Brook, NP  doxepin (SINEQUAN) 10 MG capsule Take 1 capsule (10 mg total) by mouth at bedtime. Patient taking differently: Take 10 mg by mouth at bedtime as needed (sleep).  12/28/14  Yes Adonis Brook, NP  DULoxetine (CYMBALTA) 30 MG capsule Take 30 mg by mouth 2 (two) times daily.  12/30/14  Yes Historical Provider, MD  EPIPEN 2-PAK 0.3 MG/0.3ML SOAJ injection Inject 0.3 mg into the skin once.  12/30/14  Yes Historical Provider, MD  famciclovir (FAMVIR) 500 MG tablet TAKE 1 TAB BY MOUTH DAILY FOR PREVENTION OR 3 TABS AT ONSET OF FEVER BLISTER X 1 DAY 01/21/15  Yes Historical Provider, MD  FLUoxetine (PROZAC) 10 MG capsule Take 10 mg by mouth daily.   Yes Historical Provider, MD  fluticasone (FLONASE) 50 MCG/ACT nasal spray Place 2 sprays into both nostrils as needed for allergies. 12/28/14 02/17/16 Yes Adonis Brook, NP  gabapentin (NEURONTIN) 300 MG capsule Take 1 capsule (300 mg total) by mouth 3 (three) times daily. 04/22/15  Yes Benjaman Pott, MD  HYDROcodone-acetaminophen (NORCO/VICODIN) 5-325 MG tablet Take 1-2 tablets by mouth every 6 (six) hours as needed for moderate pain. 09/03/15   Yes Cathren Laine, MD  hydrOXYzine (VISTARIL) 25 MG capsule Take 25 mg by mouth every 8 (eight) hours as needed for anxiety.  12/30/14  Yes Historical Provider, MD  levothyroxine (SYNTHROID, LEVOTHROID) 25 MCG tablet Take 1 tablet (25 mcg total) by mouth daily before breakfast. 12/28/14  Yes Adonis Brook, NP  lidocaine (LIDODERM) 5 % Place 1 patch onto the skin daily as needed (pain). Hip 03/29/15  Yes Historical Provider, MD  Methotrexate, PF, (OTREXUP) 20 MG/0.4ML SOAJ Inject 20 mg into the skin once a week.   Yes Historical Provider, MD  neomycin-polymyxin-hydrocortisone (CORTISPORIN) 3.5-10000-1 otic suspension Place 1-2 drops into the left ear daily as needed (ear pain).  04/09/15  Yes Historical Provider, MD  OTREXUP 25 MG/0.4ML SOAJ Inject 25 mg into the skin once a week. Tuesday 01/28/15  Yes Historical Provider, MD  pantoprazole (PROTONIX) 40 MG tablet TAKE 1 TABLET (40 MG TOTAL) BY MOUTH 2 (TWO) TIMES DAILY. 02/03/16  Yes  Amy S Esterwood, PA-C  pantoprazole (PROTONIX) 40 MG tablet TAKE 1 TABLET (40 MG TOTAL) BY MOUTH 2 (TWO) TIMES DAILY. 02/13/16  Yes Amy S Esterwood, PA-C  tapentadol (NUCYNTA) 50 MG TABS tablet Take 50 mg by mouth every 6 (six) hours as needed for moderate pain.    Yes Historical Provider, MD  Topiramate ER 50 MG CP24 Take 50 mg by mouth daily as needed (migraines). Trokendi XR 12/28/14  Yes Adonis Brook, NP  tretinoin (RETIN-A) 0.025 % cream Apply 1 application topically daily. 02/11/15  Yes Historical Provider, MD  triamcinolone cream (KENALOG) 0.1 % Apply 1 application topically daily as needed. Itching 04/13/15  Yes Historical Provider, MD  XERESE 5-1 % CREA Apply 1 application topically daily as needed (fever blisters).  12/30/14  Yes Historical Provider, MD    Family History Family History  Problem Relation Age of Onset  . Hyperlipidemia Mother   . Depression Mother   . Sarcoidosis Father   . Lung disease Father     Pleural Mesothelioma  . Cancer Father   . Heart  attack Paternal Grandmother   . Hypertension Paternal Grandmother   . Hypertension Maternal Grandmother   . Diabetes Maternal Grandmother   . Stroke Neg Hx   . Colon cancer Neg Hx   . Esophageal cancer Neg Hx   . Rectal cancer Neg Hx   . Stomach cancer Neg Hx     Social History Social History  Substance Use Topics  . Smoking status: Never Smoker  . Smokeless tobacco: Never Used  . Alcohol use No     Allergies   Flu virus vaccine and Influenza vaccines   Review of Systems Review of Systems  Constitutional: Positive for fatigue. Negative for appetite change.  HENT: Negative for dental problem.   Respiratory: Positive for cough. Negative for chest tightness.   Cardiovascular: Positive for palpitations. Negative for chest pain.  Gastrointestinal: Negative for abdominal pain.  Genitourinary: Negative for dysuria.  Musculoskeletal: Negative for gait problem.  Skin: Negative for rash.  Neurological: Positive for headaches.  Psychiatric/Behavioral: Negative for behavioral problems.     Physical Exam Updated Vital Signs BP 123/90 (BP Location: Left Arm)   Pulse 86   Temp 97.8 F (36.6 C) (Oral)   Resp 16   Ht 5\' 2"  (1.575 m)   Wt 252 lb (114.3 kg)   SpO2 97%   BMI 46.09 kg/m   Physical Exam  Constitutional: She appears well-developed and well-nourished.  HENT:  Head: Atraumatic.  Neck: Neck supple.  Cardiovascular: Normal rate.   Pulmonary/Chest:  Few scattered rales that cleared in the right midlung field  Abdominal: Soft. There is no tenderness.  Neurological: She is alert.  Skin: Skin is warm. Capillary refill takes less than 2 seconds.  Psychiatric: She has a normal mood and affect.     ED Treatments / Results  Labs (all labs ordered are listed, but only abnormal results are displayed) Labs Reviewed - No data to display  EKG  EKG Interpretation  Date/Time:  Tuesday February 17 2016 12:19:05 EDT Ventricular Rate:  74 PR Interval:  126 QRS  Duration: 84 QT Interval:  394 QTC Calculation: 437 R Axis:   79 Text Interpretation:  Normal sinus rhythm Normal ECG Confirmed by Rubin Payor  MD, Harrold Donath [phone]) on 02/17/2016 12:22:08 PM       Radiology Dg Chest 2 View  Result Date: 02/17/2016 CLINICAL DATA:  Headaches, congestion, and tightness in chest. EXAM: CHEST  2 VIEW COMPARISON:  Two-view chest x-ray 09/10/2015 FINDINGS: Heart size is normal. Lung volumes are low. Mild interstitial prominence is present without focal airspace disease the. There are no effusions. The visualized soft tissues and bony thorax are unremarkable. IMPRESSION: 1. Low lung volumes without focal airspace disease. 2. Mild interstitial prominence is likely due to low low lung volumes. Mild airway thickening may reflect reactive airways disease. Electronically Signed   By: Marin Roberts M.D.   On: 02/17/2016 12:19    Procedures Procedures (including critical care time)  Medications Ordered in ED Medications  fluconazole (DIFLUCAN) tablet 150 mg (150 mg Oral Given 02/17/16 1214)     Initial Impression / Assessment and Plan / ED Course  I have reviewed the triage vital signs and the nursing notes.  Pertinent labs & imaging results that were available during my care of the patient were reviewed by me and considered in my medical decision making (see chart for details).  Clinical Course    Patient with URI symptoms. Few crackles on exam did not show M.D. on x-ray. Well-appearing. States she may have a yeast infection and has had discharge. Empirically treated instead a pelvic exam. Also states she had palpitations. EKG reassuring. Will discharge home to follow-up with her PCP as needed. Likely URI.  Final Clinical Impressions(s) / ED Diagnoses   Final diagnoses:  URI (upper respiratory infection)    New Prescriptions New Prescriptions   No medications on file     Benjiman Core, MD 02/17/16 1317

## 2016-02-17 NOTE — ED Notes (Signed)
Pt walked back from xray

## 2016-02-17 NOTE — ED Triage Notes (Signed)
PT reports one week history of headache, right ear pain, nasal congestion, sinus pressure.

## 2016-02-17 NOTE — ED Notes (Signed)
MD at bedside. 

## 2016-02-17 NOTE — ED Notes (Signed)
When walking back to room, patient whispered "I think I have a yeast infection too."

## 2016-02-20 DIAGNOSIS — M533 Sacrococcygeal disorders, not elsewhere classified: Secondary | ICD-10-CM | POA: Insufficient documentation

## 2016-02-25 ENCOUNTER — Ambulatory Visit (HOSPITAL_COMMUNITY): Payer: Managed Care, Other (non HMO)

## 2016-02-25 ENCOUNTER — Encounter: Payer: Self-pay | Admitting: Cardiology

## 2016-02-25 ENCOUNTER — Ambulatory Visit (INDEPENDENT_AMBULATORY_CARE_PROVIDER_SITE_OTHER): Payer: Managed Care, Other (non HMO) | Admitting: Cardiology

## 2016-02-25 VITALS — BP 112/78 | HR 87 | Ht 62.0 in | Wt 252.8 lb

## 2016-02-25 DIAGNOSIS — E785 Hyperlipidemia, unspecified: Secondary | ICD-10-CM

## 2016-02-25 DIAGNOSIS — I2510 Atherosclerotic heart disease of native coronary artery without angina pectoris: Secondary | ICD-10-CM

## 2016-02-25 DIAGNOSIS — R002 Palpitations: Secondary | ICD-10-CM | POA: Diagnosis not present

## 2016-02-25 DIAGNOSIS — I10 Essential (primary) hypertension: Secondary | ICD-10-CM

## 2016-02-25 NOTE — Patient Instructions (Signed)
Your physician recommends that you continue on your current medications as directed. Please refer to the Current Medication list given to you today.  Your physician has requested that you have a lexiscan myoview. For further information please visit [email or web address] Please follow instruction sheet, as given.  Your physician has recommended that you wear an event monitor. Event monitors are medical devices that record the heart's electrical activity. Doctors most often [place] these monitors to diagnose arrhythmias. Arrhythmias are problems with the speed or rhythm of the heartbeat. The monitor is a small, portable device. You can wear one while you do your normal daily activities. This is usually used to diagnose what is causing palpitations/syncope (passing out). 30 DAY   Your physician wants you to follow-up in: YEAR WITH DR Mayford Knife   You will receive a reminder letter in the mail two months in advance. If you don't receive a letter, please call our office to schedule the follow-up appointment.

## 2016-02-25 NOTE — Progress Notes (Signed)
Cardiology Office Note    Date:  02/25/2016   ID:  [name] [name], DOB 17-Jun-1965, MRN [id number]  PCP:  Birdena Jubilee, MD  Cardiologist:  Armanda Magic, MD   Chief Complaint  Patient presents with  . Coronary Artery Disease  . Hypertension  . Hyperlipidemia    History of Present Illness:  [name] [name] is a 50 y.o. female with a history of diastolic dysfunction, RA, depression who presents back today. She initially saw me for atypical CP and cardiac workup with nuclear stress test and echo were normal with diastolic dysfunction on echo. A coronary CTA showed calcium score in the 95th% but no obstructive ASCAD (30% prox LAD stenosis).  She has recently been having palpitations for the past 2 months.  She says that these occur a few times daily and feel like fluttering.  This palpitations make her lightheaded but she has not had any syncope.  She does not drink caffeine.  She also has been having some chest tightness when she gets the palpitations and sometimes with exertion.  She has not had any SOB or DOE recently.   She denies any syncope, PND, orthopnea or LE edema.  Past Medical History:  Diagnosis Date  . Anxiety   . Coronary artery calcification seen on CAT scan    minimal CAD with 30% prox and mild LAD  . Depression   . Diastolic dysfunction   . Dyslipidemia 01/30/2015  . Esophageal ring   . Fibromyalgia   . Hiatal hernia   . HSV-1 infection   . Morbid obesity (HCC)   . Rheumatoid arthritis(714.0)    M05.79  . Rheumatoid arthritis, seropositive, 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HCC)    Treated with Orencia, TB neg 09/12/2015    Past Surgical History:  Procedure Laterality Date  . CESAREAN SECTION    . ESOPHAGEAL MANOMETRY N/A 10/28/2014   Procedure: ESOPHAGEAL MANOMETRY (EM);  Surgeon: Hilarie Fredrickson, MD;  Location: [facility];  Service: Endoscopy;  Laterality: N/A;  . HERNIA REPAIR     reports surgery on 3 hernias, with 2 more present  . LAPAROSCOPIC GASTRIC SLEEVE  RESECTION  11/21/12   [facility]  . LEFT HEART CATHETERIZATION WITH CORONARY ANGIOGRAM N/A 08/26/2014   Procedure: LEFT HEART CATHETERIZATION WITH CORONARY ANGIOGRAM;  Surgeon: Runell Gess, MD;  Location: [facility] CATH LAB;  Service: Cardiovascular;  Laterality: N/A;  . TUBAL LIGATION      Current Medications: Outpatient Medications Prior to Visit  Medication Sig Dispense Refill  . Abatacept (ORENCIA IV) Inject 1,000 mg into the vein every 30 (thirty) days. INFUSE OVER 30 MINUTES EVERY 4 WEEKS 1000MG     . Adapalene 0.3 % gel Apply 1 application topically daily as needed (acne).     . ARIPiprazole (ABILIFY) 2 MG tablet Take 1 tablet (2 mg total) by mouth daily. 30 tablet 0  . atorvastatin (LIPITOR) 80 MG tablet TAKE 1 TABLET BY MOUTH EVERY DAY AT 6PM 30 tablet 3  . Betamethasone Valerate 0.12 % foam Apply 1 application topically daily as needed. Itching  7  . buPROPion (WELLBUTRIN XL) 300 MG 24 hr tablet Take 1 tablet (300 mg total) by mouth daily. 30 tablet 0  . CARAFATE 1 GM/10ML suspension TAKE 10 MLS (1 G TOTAL) BY MOUTH 4 (FOUR) TIMES DAILY - WITH MEALS AND AT BEDTIME.  0  . clindamycin-benzoyl peroxide (BENZACLIN) gel Apply 1 application topically daily as needed. Acne    . clobetasol cream (TEMOVATE) 0.05 % Apply 1 application  topically daily as needed. Skin discoloration  0  . cyclobenzaprine (FLEXERIL) 10 MG tablet TAKE 1 TABLET BY MOUTH EVERY 6-8 HOURS AS NEEDED FOR MUSCLE SPASMS  0  . diclofenac sodium (VOLTAREN) 1 % GEL Apply 2 g topically 2 (two) times daily as needed (pain).     Marland Kitchen diltiazem (CARTIA XT) 180 MG 24 hr capsule Take 1 capsule (180 mg total) by mouth daily. 90 capsule 1  . doxepin (SINEQUAN) 10 MG capsule Take 1 capsule (10 mg total) by mouth at bedtime. (Patient taking differently: Take 10 mg by mouth at bedtime as needed (sleep). ) 30 capsule 0  . DULoxetine (CYMBALTA) 30 MG capsule Take 30 mg by mouth 2 (two) times daily.     Marland Kitchen EPIPEN 2-PAK 0.3 MG/0.3ML SOAJ injection  Inject 0.3 mg into the skin once.     . famciclovir (FAMVIR) 500 MG tablet TAKE 1 TAB BY MOUTH DAILY FOR PREVENTION OR 3 TABS AT ONSET OF FEVER BLISTER X 1 DAY  11  . FLUoxetine (PROZAC) 10 MG capsule Take 10 mg by mouth daily.    Marland Kitchen gabapentin (NEURONTIN) 300 MG capsule Take 1 capsule (300 mg total) by mouth 3 (three) times daily. 90 capsule 0  . HYDROcodone-acetaminophen (NORCO/VICODIN) 5-325 MG tablet Take 1-2 tablets by mouth every 6 (six) hours as needed for moderate pain. 10 tablet 0  . hydrOXYzine (VISTARIL) 25 MG capsule Take 25 mg by mouth every 8 (eight) hours as needed for anxiety.     Marland Kitchen levothyroxine (SYNTHROID, LEVOTHROID) 25 MCG tablet Take 1 tablet (25 mcg total) by mouth daily before breakfast. 30 tablet 0  . lidocaine (LIDODERM) 5 % Place 1 patch onto the skin daily as needed (pain). Hip    . Methotrexate, PF, (OTREXUP) 20 MG/0.4ML SOAJ Inject 20 mg into the skin once a week.    . neomycin-polymyxin-hydrocortisone (CORTISPORIN) 3.5-10000-1 otic suspension Place 1-2 drops into the left ear daily as needed (ear pain).   0  . pantoprazole (PROTONIX) 40 MG tablet TAKE 1 TABLET (40 MG TOTAL) BY MOUTH 2 (TWO) TIMES DAILY. 60 tablet 6  . tapentadol (NUCYNTA) 50 MG TABS tablet Take 50 mg by mouth every 6 (six) hours as needed for moderate pain.     . Topiramate ER 50 MG CP24 Take 50 mg by mouth daily as needed (migraines). Trokendi XR 30 capsule   . tretinoin (RETIN-A) 0.025 % cream Apply 1 application topically daily.    Marland Kitchen triamcinolone cream (KENALOG) 0.1 % Apply 1 application topically daily as needed. Itching  0  . XERESE 5-1 % CREA Apply 1 application topically daily as needed (fever blisters).     . fluticasone (FLONASE) 50 MCG/ACT nasal spray Place 2 sprays into both nostrils as needed for allergies. 16 g 11  . OTREXUP 25 MG/0.4ML SOAJ Inject 25 mg into the skin once a week. Tuesday    . pantoprazole (PROTONIX) 40 MG tablet TAKE 1 TABLET (40 MG TOTAL) BY MOUTH 2 (TWO) TIMES DAILY.  (Patient not taking: Reported on 02/25/2016) 60 tablet 6   No facility-administered medications prior to visit.      Allergies:   Flu virus vaccine and Influenza vaccines   Social History   Social History  . Marital status: Married    Spouse name: [name]   . Number of children: 2  . Years of education: 16   Occupational History  . CUSTOMER SERVICE [employer]   Social History Main Topics  . Smoking status: Never  Smoker  . Smokeless tobacco: Never Used  . Alcohol use No  . Drug use: No  . Sexual activity: Yes    Partners: Male    Birth control/ protection: None   Other Topics Concern  . None   Social History Narrative   Marital Status: Married Probation officer)    Children:  Son [name]) Daughter [name])    Pets: None    Living Situation: Lives with husband and children    Occupation: Occupational psychologist Administrator)     Education: Oncologist (Psychology)     Tobacco Use/Exposure:  None    Alcohol Use:  Occasional   Drug Use:  None   Diet:  Regular   Exercise: Walking or Treadmill (2 x week)   Hobbies: Clinical cytogeneticist, [employer].                 Family History:  The patient's family history includes Cancer in her father; Depression in her mother; Diabetes in her maternal grandmother; Heart attack in her paternal grandmother; Hyperlipidemia in her mother; Hypertension in her maternal grandmother and paternal grandmother; Lung disease in her father; Sarcoidosis in her father.   ROS:   Please see the history of present illness.    ROS All other systems reviewed and are negative.  No flowsheet data found.     PHYSICAL EXAM:   VS:  BP 112/78   Pulse 87   Ht 5\' 2"  (1.575 m)   Wt 252 lb 12.8 oz (114.7 kg)   SpO2 98%   BMI 46.24 kg/m    GEN: Well nourished, well developed, in no acute distress  HEENT: normal  Neck: no JVD, carotid bruits, or masses Cardiac: RRR; no murmurs, rubs, or gallops,no edema.  Intact distal pulses  bilaterally.  Respiratory:  clear to auscultation bilaterally, normal work of breathing GI: soft, nontender, nondistended, + BS MS: no deformity or atrophy  Skin: warm and dry, no rash Neuro:  Alert and Oriented x 3, Strength and sensation are intact Psych: euthymic mood, full affect  Wt Readings from Last 3 Encounters:  02/25/16 252 lb 12.8 oz (114.7 kg)  02/17/16 252 lb (114.3 kg)  01/28/16 249 lb (112.9 kg)      Studies/Labs Reviewed:   EKG:  EKG is not ordered today.    Recent Labs: 04/28/2015: Magnesium 2.0 12/31/2015: ALT 14; BUN 7; Creatinine, Ser 0.79; Hemoglobin 13.1; Platelets 306; Potassium 3.8; Sodium 139   Lipid Panel    Component Value Date/Time   CHOL 152 02/06/2015 1055   TRIG 82.0 02/06/2015 1055   HDL 55.50 02/06/2015 1055   CHOLHDL 3 02/06/2015 1055   VLDL 16.4 02/06/2015 1055   LDLCALC 80 02/06/2015 1055    Additional studies/ records that were reviewed today include:  none    ASSESSMENT:    1. Coronary artery calcification seen on CAT scan   2. Essential hypertension   3. Dyslipidemia   4. Heart palpitations      PLAN:  In order of problems listed above:    1. Coronary artery calcifications on CT scan - she has been having some anginal chest pain so I will get a nuclear stress test to rule out ischemia.  Continue aggressive risk factor modification with statin.   2.   HTN - BP controlled on current meds.  Continue Cardizem.  3.   Hyperlipidemia - LDL goal < 70.  Continue statin.  4.   Heart palpitations - I will get a heart monitor to evaluate  for arrhythmias.    Medication Adjustments/Labs and Tests Ordered: Current medicines are reviewed at length with the patient today.  Concerns regarding medicines are outlined above.  Medication changes, Labs and Tests ordered today are listed in the Patient Instructions below.  There are no Patient Instructions on file for this visit.   Signed, Armanda Magicraci Hania Cerone, MD  02/25/2016 1:43 PM    [facility] [address], [place], [place]  [address] Phone: [phone]) [phone]; Fax: [phone]

## 2016-02-26 ENCOUNTER — Telehealth (HOSPITAL_COMMUNITY): Payer: Self-pay | Admitting: *Deleted

## 2016-02-26 NOTE — Telephone Encounter (Signed)
Patient given detailed instructions per Myocardial Perfusion Study Information Sheet for the test on 03/01/16. Patient notified to arrive 15 minutes early and that it is imperative to arrive on time for appointment to keep from having the test rescheduled.  If you need to cancel or reschedule your appointment, please call the office within 24 hours of your appointment. Failure to do so may result in a cancellation of your appointment, and a $50 no show fee. Patient verbalized understanding. Ricky Ala, RN

## 2016-02-27 ENCOUNTER — Ambulatory Visit (HOSPITAL_COMMUNITY)
Admission: RE | Admit: 2016-02-27 | Discharge: 2016-02-27 | Disposition: A | Discharge status: Home / Self Care | Payer: Managed Care, Other (non HMO) | Source: Ambulatory Visit | Attending: Rheumatology | Admitting: Rheumatology

## 2016-02-27 DIAGNOSIS — M0579 Rheumatoid arthritis with rheumatoid factor of multiple sites without organ or systems involvement: Secondary | ICD-10-CM | POA: Diagnosis not present

## 2016-02-27 LAB — COMPREHENSIVE METABOLIC PANEL WITH GFR
ALT: 13 U/L — ABNORMAL LOW (ref 14–54)
AST: 18 U/L (ref 15–41)
Albumin: 3.3 g/dL — ABNORMAL LOW (ref 3.5–5.0)
Alkaline Phosphatase: 69 U/L (ref 38–126)
Anion gap: 5 (ref 5–15)
BUN: 9 mg/dL (ref 6–20)
CO2: 27 mmol/L (ref 22–32)
Calcium: 8.9 mg/dL (ref 8.9–10.3)
Chloride: 110 mmol/L (ref 101–111)
Creatinine, Ser: 0.86 mg/dL (ref 0.44–1.00)
GFR calc Af Amer: >60 mL/min
GFR calc non Af Amer: >60 mL/min
Glucose, Bld: 92 mg/dL (ref 65–99)
Potassium: 4.1 mmol/L (ref 3.5–5.1)
Sodium: 142 mmol/L (ref 135–145)
Total Bilirubin: 0.6 mg/dL (ref 0.3–1.2)
Total Protein: 6.7 g/dL (ref 6.5–8.1)

## 2016-02-27 LAB — DIFFERENTIAL
Basophils Absolute: 0 10*3/uL (ref 0.0–0.1)
Basophils Relative: 0 %
Eosinophils Absolute: 0.2 10*3/uL (ref 0.0–0.7)
Eosinophils Relative: 2 %
Lymphocytes Relative: 46 %
Lymphs Abs: 3.6 10*3/uL (ref 0.7–4.0)
Monocytes Absolute: 0.5 10*3/uL (ref 0.1–1.0)
Monocytes Relative: 6 %
Neutro Abs: 3.6 10*3/uL (ref 1.7–7.7)
Neutrophils Relative %: 46 %

## 2016-02-27 LAB — CBC
HCT: 43.8 % (ref 36.0–46.0)
Hemoglobin: 13.7 g/dL (ref 12.0–15.0)
MCH: 27.1 pg (ref 26.0–34.0)
MCHC: 31.3 g/dL (ref 30.0–36.0)
MCV: 86.7 fL (ref 78.0–100.0)
Platelets: 323 10*3/uL (ref 150–400)
RBC: 5.05 MIL/uL (ref 3.87–5.11)
RDW: 13 % (ref 11.5–15.5)
WBC: 7.9 10*3/uL (ref 4.0–10.5)

## 2016-02-27 MED ORDER — ACETAMINOPHEN 325 MG PO TABS
ORAL_TABLET | ORAL | Status: AC
  Administered 2016-02-27: 650 mg via ORAL
  Filled 2016-02-27: qty 2

## 2016-02-27 MED ORDER — DIPHENHYDRAMINE HCL 25 MG PO CAPS
25.0000 mg | ORAL_CAPSULE | Freq: Once | ORAL | Status: AC
  Administered 2016-02-27: 25 mg via ORAL

## 2016-02-27 MED ORDER — ACETAMINOPHEN 325 MG PO TABS
650.0000 mg | ORAL_TABLET | ORAL | Status: DC
  Administered 2016-02-27: 650 mg via ORAL

## 2016-02-27 MED ORDER — SODIUM CHLORIDE 0.9 % IV SOLN
1000.0000 mg | INTRAVENOUS | Status: DC
  Administered 2016-02-27: 1000 mg via INTRAVENOUS
  Filled 2016-02-27: qty 40

## 2016-02-27 MED ORDER — DIPHENHYDRAMINE HCL 25 MG PO CAPS
ORAL_CAPSULE | ORAL | Status: AC
  Administered 2016-02-27: 25 mg via ORAL
  Filled 2016-02-27: qty 1

## 2016-02-27 MED ORDER — SODIUM CHLORIDE 0.9 % IV SOLN
INTRAVENOUS | Status: DC
  Administered 2016-02-27: 11:00:00 via INTRAVENOUS

## 2016-03-01 ENCOUNTER — Ambulatory Visit (HOSPITAL_COMMUNITY): Payer: Managed Care, Other (non HMO) | Attending: Cardiovascular Disease

## 2016-03-01 DIAGNOSIS — I2510 Atherosclerotic heart disease of native coronary artery without angina pectoris: Secondary | ICD-10-CM

## 2016-03-01 MED ORDER — TECHNETIUM TC 99M TETROFOSMIN IV KIT
32.5000 | PACK | Freq: Once | INTRAVENOUS | Status: AC | PRN
  Administered 2016-03-01: 33 via INTRAVENOUS
  Filled 2016-03-01: qty 33

## 2016-03-01 MED ORDER — REGADENOSON 0.4 MG/5ML IV SOLN
0.4000 mg | Freq: Once | INTRAVENOUS | Status: AC
  Administered 2016-03-01: 0.4 mg via INTRAVENOUS

## 2016-03-02 ENCOUNTER — Ambulatory Visit (INDEPENDENT_AMBULATORY_CARE_PROVIDER_SITE_OTHER): Payer: Managed Care, Other (non HMO)

## 2016-03-02 ENCOUNTER — Other Ambulatory Visit: Payer: Self-pay

## 2016-03-02 ENCOUNTER — Ambulatory Visit (HOSPITAL_COMMUNITY): Payer: Managed Care, Other (non HMO) | Attending: Cardiology

## 2016-03-02 DIAGNOSIS — R002 Palpitations: Secondary | ICD-10-CM

## 2016-03-02 LAB — MYOCARDIAL PERFUSION IMAGING
CHL CUP NUCLEAR SDS: 2
LV dias vol: 81 mL (ref 46–106)
LV sys vol: 30 mL
NUC STRESS TID: 1.11
Peak HR: 98 {beats}/min
RATE: 0.33
Rest HR: 73 {beats}/min
SRS: 5
SSS: 4

## 2016-03-02 MED ORDER — TECHNETIUM TC 99M TETROFOSMIN IV KIT
32.8000 | PACK | Freq: Once | INTRAVENOUS | Status: AC | PRN
  Administered 2016-03-02: 32.8 via INTRAVENOUS
  Filled 2016-03-02: qty 33

## 2016-03-02 MED ORDER — DILTIAZEM HCL ER COATED BEADS 180 MG PO CP24: 180.0000 mg | ORAL_CAPSULE | Freq: Every day | ORAL | 3 refills | Status: DC

## 2016-03-24 ENCOUNTER — Inpatient Hospital Stay (HOSPITAL_COMMUNITY): Admission: RE | Admit: 2016-03-24 | Payer: Self-pay | Source: Ambulatory Visit

## 2016-04-01 ENCOUNTER — Other Ambulatory Visit: Payer: Self-pay | Admitting: Radiology

## 2016-04-01 DIAGNOSIS — M0579 Rheumatoid arthritis with rheumatoid factor of multiple sites without organ or systems involvement: Secondary | ICD-10-CM

## 2016-04-01 MED ORDER — SODIUM CHLORIDE 0.9 % IV SOLN: 1000.0000 mg | INTRAVENOUS | Status: DC

## 2016-04-04 ENCOUNTER — Other Ambulatory Visit: Payer: Self-pay | Admitting: Radiology

## 2016-04-04 DIAGNOSIS — M0579 Rheumatoid arthritis with rheumatoid factor of multiple sites without organ or systems involvement: Secondary | ICD-10-CM

## 2016-04-05 ENCOUNTER — Other Ambulatory Visit: Payer: Self-pay | Admitting: Cardiology

## 2016-04-06 ENCOUNTER — Telehealth (INDEPENDENT_AMBULATORY_CARE_PROVIDER_SITE_OTHER): Payer: Self-pay

## 2016-04-06 ENCOUNTER — Other Ambulatory Visit (HOSPITAL_COMMUNITY): Payer: Self-pay | Admitting: *Deleted

## 2016-04-06 NOTE — Telephone Encounter (Signed)
Faxed PA for Orencia to [phone]

## 2016-04-07 ENCOUNTER — Ambulatory Visit (HOSPITAL_COMMUNITY)
Admission: RE | Admit: 2016-04-07 | Discharge: 2016-04-07 | Disposition: A | Discharge status: Home / Self Care | Payer: BLUE CROSS/BLUE SHIELD | Source: Ambulatory Visit | Attending: Rheumatology | Admitting: Rheumatology

## 2016-04-07 DIAGNOSIS — M0579 Rheumatoid arthritis with rheumatoid factor of multiple sites without organ or systems involvement: Secondary | ICD-10-CM | POA: Diagnosis not present

## 2016-04-07 LAB — COMPREHENSIVE METABOLIC PANEL
ALBUMIN: 3.2 g/dL — AB (ref 3.5–5.0)
ALK PHOS: 68 U/L (ref 38–126)
ALT: 17 U/L (ref 14–54)
ANION GAP: 4 — AB (ref 5–15)
AST: 27 U/L (ref 15–41)
BUN: 6 mg/dL (ref 6–20)
CALCIUM: 8.9 mg/dL (ref 8.9–10.3)
CO2: 28 mmol/L (ref 22–32)
Chloride: 110 mmol/L (ref 101–111)
Creatinine, Ser: 0.97 mg/dL (ref 0.44–1.00)
GFR calc Af Amer: >60 mL/min (ref >60)
GFR calc non Af Amer: >60 mL/min (ref >60)
GLUCOSE: 132 mg/dL — AB (ref 65–99)
Potassium: 3.7 mmol/L (ref 3.5–5.1)
SODIUM: 142 mmol/L (ref 135–145)
Total Bilirubin: 0.3 mg/dL (ref 0.3–1.2)
Total Protein: 6.7 g/dL (ref 6.5–8.1)

## 2016-04-07 LAB — CBC
HCT: 41.1 % (ref 36.0–46.0)
HEMOGLOBIN: 13.2 g/dL (ref 12.0–15.0)
MCH: 27.3 pg (ref 26.0–34.0)
MCHC: 32.1 g/dL (ref 30.0–36.0)
MCV: 85.1 fL (ref 78.0–100.0)
PLATELETS: 291 10*3/uL (ref 150–400)
RBC: 4.83 MIL/uL (ref 3.87–5.11)
RDW: 12.7 % (ref 11.5–15.5)
WBC: 8.3 10*3/uL (ref 4.0–10.5)

## 2016-04-07 MED ORDER — SODIUM CHLORIDE 0.9 % IV SOLN
1000.0000 mg | INTRAVENOUS | Status: DC
  Administered 2016-04-07: 1000 mg via INTRAVENOUS
  Filled 2016-04-07: qty 40

## 2016-04-07 MED ORDER — ACETAMINOPHEN 325 MG PO TABS
ORAL_TABLET | ORAL | Status: AC
  Filled 2016-04-07: qty 2

## 2016-04-07 MED ORDER — DIPHENHYDRAMINE HCL 25 MG PO CAPS
ORAL_CAPSULE | ORAL | Status: AC
Start: 2016-04-07 — End: 2016-04-07
  Filled 2016-04-07: qty 1

## 2016-04-07 MED ORDER — DIPHENHYDRAMINE HCL 25 MG PO CAPS
25.0000 mg | ORAL_CAPSULE | Freq: Once | ORAL | Status: DC
Start: 2016-04-07 — End: 2016-04-07
  Administered 2016-04-07: 25 mg via ORAL

## 2016-04-07 MED ORDER — ACETAMINOPHEN 325 MG PO TABS
650.0000 mg | ORAL_TABLET | Freq: Once | ORAL | Status: DC
  Administered 2016-04-07: 650 mg via ORAL

## 2016-04-08 ENCOUNTER — Telehealth: Payer: Self-pay | Admitting: Radiology

## 2016-04-08 NOTE — Telephone Encounter (Signed)
I have called patient to advise labs are normal no answer so my chart message was sent

## 2016-04-13 ENCOUNTER — Telehealth: Payer: Self-pay | Admitting: Cardiology

## 2016-04-13 DIAGNOSIS — R0602 Shortness of breath: Secondary | ICD-10-CM

## 2016-04-13 DIAGNOSIS — R9439 Abnormal result of other cardiovascular function study: Secondary | ICD-10-CM

## 2016-04-13 NOTE — Telephone Encounter (Signed)
-----   Message from Quintella Reichert, MD sent at 03/04/2016  8:03 PM EDT ----- Please order coronary CTA with morphology

## 2016-04-13 NOTE — Telephone Encounter (Signed)
Patient reports her insurance information is now current and it is OK to proceed with ordering CT. Test ordered for scheduling. Patient was grateful for assistance.

## 2016-04-13 NOTE — Telephone Encounter (Signed)
Follow Up: ° ° ° ° ° °Returning your call from Friday. °

## 2016-04-15 NOTE — Telephone Encounter (Signed)
Received a fax from [employer] stating that active coverage not available for member.  Will call patient and insurance to verify coverage for patient infusion.

## 2016-04-16 ENCOUNTER — Other Ambulatory Visit: Payer: Self-pay | Admitting: Rheumatology

## 2016-04-16 NOTE — Telephone Encounter (Signed)
Last visit 10/22/15 Next visit 05/06/16 Labs c/w previous 04/07/16 Ok to refill per Dr Corliss Skains

## 2016-04-21 ENCOUNTER — Telehealth: Payer: Self-pay | Admitting: Radiology

## 2016-04-21 ENCOUNTER — Encounter: Payer: Self-pay | Admitting: Cardiology

## 2016-04-21 NOTE — Telephone Encounter (Signed)
I have gotten a call about a PA on her Otrexup, this is pending on cover my meds.

## 2016-04-23 NOTE — Telephone Encounter (Signed)
Patient current insurance is now [employer] of [place].  BCBS PA form for Dub Amis was faxed by [place] at [employer].

## 2016-04-26 ENCOUNTER — Telehealth: Payer: Self-pay | Admitting: Radiology

## 2016-04-26 NOTE — Telephone Encounter (Signed)
[employer] has denied the Otrexup, will have to change to injectable MTX will call patient

## 2016-04-28 MED ORDER — "TUBERCULIN-ALLERGY SYRINGES 28G X 1/2"" 1 ML MISC": 3 refills | Status: DC

## 2016-04-28 MED ORDER — METHOTREXATE SODIUM CHEMO INJECTION 50 MG/2ML: 25.0000 mg | Freq: Once | INTRAMUSCULAR | 0 refills | Status: AC

## 2016-04-28 NOTE — Telephone Encounter (Signed)
Sent my chart message and have left message for her also

## 2016-04-28 NOTE — Addendum Note (Signed)
Addended byCaffie Damme on: 04/28/2016 02:05 PM   Modules accepted: Orders

## 2016-05-04 ENCOUNTER — Ambulatory Visit (HOSPITAL_COMMUNITY)
Admission: RE | Admit: 2016-05-04 | Discharge: 2016-05-04 | Disposition: A | Discharge status: Home / Self Care | Payer: BLUE CROSS/BLUE SHIELD | Source: Ambulatory Visit | Attending: Cardiology | Admitting: Cardiology

## 2016-05-04 ENCOUNTER — Telehealth: Payer: Self-pay | Admitting: Rheumatology

## 2016-05-04 ENCOUNTER — Encounter (HOSPITAL_COMMUNITY): Payer: Self-pay

## 2016-05-04 DIAGNOSIS — R9439 Abnormal result of other cardiovascular function study: Secondary | ICD-10-CM

## 2016-05-04 DIAGNOSIS — R0602 Shortness of breath: Secondary | ICD-10-CM | POA: Diagnosis present

## 2016-05-04 DIAGNOSIS — I2510 Atherosclerotic heart disease of native coronary artery without angina pectoris: Secondary | ICD-10-CM | POA: Insufficient documentation

## 2016-05-04 MED ORDER — METOPROLOL TARTRATE 5 MG/5ML IV SOLN
5.0000 mg | INTRAVENOUS | Status: DC | PRN
Start: 2016-05-04 — End: 2016-05-05
  Administered 2016-05-04 (×2): 5 mg via INTRAVENOUS

## 2016-05-04 MED ORDER — METOPROLOL TARTRATE 5 MG/5ML IV SOLN
INTRAVENOUS | Status: AC
  Filled 2016-05-04: qty 15

## 2016-05-04 MED ORDER — NITROGLYCERIN 0.4 MG SL SUBL
SUBLINGUAL_TABLET | SUBLINGUAL | Status: AC
  Filled 2016-05-04: qty 1

## 2016-05-04 MED ORDER — IOPAMIDOL (ISOVUE-370) INJECTION 76%
INTRAVENOUS | Status: AC
Start: 2016-05-04 — End: 2016-05-04
  Administered 2016-05-04: 80 mL
  Filled 2016-05-04: qty 100

## 2016-05-04 MED ORDER — NITROGLYCERIN 0.4 MG SL SUBL
0.8000 mg | SUBLINGUAL_TABLET | Freq: Once | SUBLINGUAL | Status: AC
  Administered 2016-05-04: 0.8 mg via SUBLINGUAL

## 2016-05-04 NOTE — Telephone Encounter (Signed)
Patient called to let Amy know that she does remember how to take MTX injections and would like that rx to be called in.

## 2016-05-05 ENCOUNTER — Other Ambulatory Visit: Payer: Self-pay | Admitting: Radiology

## 2016-05-06 ENCOUNTER — Ambulatory Visit: Payer: Self-pay | Admitting: Rheumatology

## 2016-05-07 ENCOUNTER — Ambulatory Visit (HOSPITAL_COMMUNITY): Admission: RE | Admit: 2016-05-07 | Payer: BLUE CROSS/BLUE SHIELD | Source: Ambulatory Visit

## 2016-05-11 ENCOUNTER — Telehealth: Payer: Self-pay | Admitting: Cardiology

## 2016-05-11 DIAGNOSIS — I2510 Atherosclerotic heart disease of native coronary artery without angina pectoris: Secondary | ICD-10-CM

## 2016-05-11 MED ORDER — DILTIAZEM HCL ER COATED BEADS 180 MG PO CP24: 180.0000 mg | ORAL_CAPSULE | Freq: Every day | ORAL | 3 refills | Status: DC

## 2016-05-11 MED ORDER — ISOSORBIDE MONONITRATE ER 30 MG PO TB24: 15.0000 mg | ORAL_TABLET | Freq: Every day | ORAL | 11 refills | Status: DC

## 2016-05-11 NOTE — Telephone Encounter (Signed)
New Message  Pt voiced she is calling to get her results.  Please f/u with pt

## 2016-05-11 NOTE — Telephone Encounter (Signed)
-----   Message from Quintella Reichert, MD sent at 05/09/2016  3:20 PM EST ----- Coronary CTA showed high calcium score with 25-50% long diffuse calcified plaque in the prox to mid LAD and severe noncalcified ostial to pros plaque with >70% stenosis and minimal plaque in RCa and LCx.  Diag likely culprit for symptoms.  Please add Imdur 15mg  daily and followup with extender in 2-3 weeks to see how she is doing.  Have her come in for FLP and ALT

## 2016-05-11 NOTE — Telephone Encounter (Signed)
Informed patient of results and verbal understanding expressed.  Instructed patient to START IMDUR 15 mg daily. Scheduled patient 12/19 with B. Simmons for evaluation. She will come fasting and have FLP and ALT same day. She also requests Dilt refills. She agrees with treatment plan.

## 2016-05-11 NOTE — Telephone Encounter (Signed)
Follow up  Pt voiced she is calling to get her results.  Please f/u with pt

## 2016-05-18 ENCOUNTER — Telehealth: Payer: Self-pay | Admitting: Cardiology

## 2016-05-18 NOTE — Telephone Encounter (Signed)
I don't feel the Imdur is working.  It is making me have diarrhea and flutters in my chest. Just don't feel good.

## 2016-05-18 NOTE — Telephone Encounter (Signed)
Patient states since she has started Imdur, she has experienced heart fluttering a couple times a day. With the fluttering, she has some SOB.  She reports no other symptoms otherwise. She requests a sooner appointment than scheduled 12/19. Offered her OV tomorrow, but she did not want to come in the morning. Scheduled patient Thursday with B. Simmons.  She understands to call if symptoms worsen prior to appointment.

## 2016-05-19 ENCOUNTER — Encounter: Payer: Self-pay | Admitting: Cardiology

## 2016-05-20 ENCOUNTER — Encounter: Payer: Self-pay | Admitting: Cardiology

## 2016-05-20 ENCOUNTER — Ambulatory Visit (INDEPENDENT_AMBULATORY_CARE_PROVIDER_SITE_OTHER): Payer: BLUE CROSS/BLUE SHIELD | Admitting: Cardiology

## 2016-05-20 VITALS — BP 126/82 | HR 97 | Ht 62.0 in | Wt 258.4 lb

## 2016-05-20 DIAGNOSIS — I2510 Atherosclerotic heart disease of native coronary artery without angina pectoris: Secondary | ICD-10-CM

## 2016-05-20 MED ORDER — NITROGLYCERIN 0.4 MG SL SUBL: 0.4000 mg | SUBLINGUAL_TABLET | SUBLINGUAL | 3 refills | Status: DC | PRN

## 2016-05-20 MED ORDER — ATORVASTATIN CALCIUM 80 MG PO TABS: 80.0000 mg | ORAL_TABLET | Freq: Every day | ORAL | 3 refills | Status: DC

## 2016-05-20 MED ORDER — METOPROLOL TARTRATE 25 MG PO TABS: 12.5000 mg | ORAL_TABLET | Freq: Two times a day (BID) | ORAL | 3 refills | Status: DC

## 2016-05-20 NOTE — Progress Notes (Signed)
05/20/2016 [name]   1966-03-30  [id number]  Primary Physician Birdena Jubilee, MD Primary Cardiologist: Dr. Mayford Knife    Reason for Visit/CC: F/u for Chest Pain/ CAD   HPI:  The patient is a 50 y.o. female with a history of diastolic dysfunction, RA, depression who presents back today for f/u for CAD and chest pain. She initially saw Dr. Mayford Knife several years ago for atypical CP and cardiac workup with nuclear stress test and echo were normal with diastolic dysfunction on echo. A coronary CTA showed calcium score in the 95th% but no obstructive ASCAD (30% prox LAD stenosis).  Most recently, she developed palpitations and recurrent CP leading to repeat assessment. Cardiac monitor showed some PACs but no other significant arrhythmias. She had a NST on 03/01/16 that showed a fixed small, mild apical perfusion defect may be due to attenuation given normal wall motion.  There was also a small, mild reversible basal anterolateral perfusion defect. Ischemia could not be ruled out. Subsequently, Dr. Mayford Knife ordered for her to undergo a repeat coronary CTA with morphology. This was performed 05/04/16 and demonstrated a coronary calcium score of 171. All in proximal to mid LAD. This was 33 percentile for age and sex matched control. Normal coronary origin with right dominance. Diffuse long calcified plaque in proximal to mid LAD associated with 25-50% stenosis. Second diagonal branch has severe noncalcified ostial/proximal plaque with associated stenosis >70%. Minimal plaque in RCA and LCX arteries.  Mildly dilated pulmonary artery measuring 31 mm suggestive of pulmonary hypertension.  Based on her coronary CTA findings, medical therapy was recommended. Low dose Imdur, 15 mg, was added. Unfortunately, she was unable to tolerate Imdur due to severe headaches and diarrhea. Patient self discontinued Imdur and side effects resolved. She continues to note chest pain and palpitations. Her chest pain occurs  with no particular pattern. It can occur at rest and with exertion. Described and pressure/tightness. Typically last several seconds to minutes and spontaneously resolves. Mild associated dyspnea when this occurs. She is currently CP free. VSS.   Current Meds  Medication Sig  . Adapalene 0.3 % gel Apply 1 application topically daily as needed (acne).   . ARIPiprazole (ABILIFY) 2 MG tablet Take 1 tablet (2 mg total) by mouth daily.  Marland Kitchen atorvastatin (LIPITOR) 80 MG tablet Take 1 tablet (80 mg total) by mouth daily at 6 PM.  . Betamethasone Valerate 0.12 % foam Apply 1 application topically daily as needed. Itching  . buPROPion (WELLBUTRIN XL) 300 MG 24 hr tablet Take 1 tablet (300 mg total) by mouth daily.  Marland Kitchen CARAFATE 1 GM/10ML suspension TAKE 10 MLS (1 G TOTAL) BY MOUTH 4 (FOUR) TIMES DAILY - WITH MEALS AND AT BEDTIME.  . clindamycin-benzoyl peroxide (BENZACLIN) gel Apply 1 application topically daily as needed. Acne  . clobetasol cream (TEMOVATE) 0.05 % Apply 1 application topically daily as needed. Skin discoloration  . cyclobenzaprine (FLEXERIL) 10 MG tablet TAKE 1 TABLET BY MOUTH EVERY 6-8 HOURS AS NEEDED FOR MUSCLE SPASMS  . diclofenac sodium (VOLTAREN) 1 % GEL Apply 2 g topically 2 (two) times daily as needed (pain).   Marland Kitchen diltiazem (CARTIA XT) 180 MG 24 hr capsule Take 1 capsule (180 mg total) by mouth daily.  Marland Kitchen doxepin (SINEQUAN) 10 MG capsule Take 1 capsule (10 mg total) by mouth at bedtime. (Patient taking differently: Take 10 mg by mouth at bedtime as needed (sleep). )  . DULoxetine (CYMBALTA) 30 MG capsule Take 30 mg by mouth 2 (two) times  daily.   Marland Kitchen. EPIPEN 2-PAK 0.3 MG/0.3ML SOAJ injection Inject 0.3 mg into the skin once.   . famciclovir (FAMVIR) 500 MG tablet TAKE 1 TAB BY MOUTH DAILY FOR PREVENTION OR 3 TABS AT ONSET OF FEVER BLISTER X 1 DAY  . FLUoxetine (PROZAC) 10 MG capsule Take 10 mg by mouth daily.  Marland Kitchen. gabapentin (NEURONTIN) 300 MG capsule Take 1 capsule (300 mg total) by mouth 3  (three) times daily.  Marland Kitchen. HYDROcodone-acetaminophen (NORCO/VICODIN) 5-325 MG tablet Take 1-2 tablets by mouth every 6 (six) hours as needed for moderate pain.  . hydrOXYzine (VISTARIL) 25 MG capsule Take 25 mg by mouth every 8 (eight) hours as needed for anxiety.   . isosorbide mononitrate (IMDUR) 30 MG 24 hr tablet Take 0.5 tablets (15 mg total) by mouth daily.  Marland Kitchen. levothyroxine (SYNTHROID, LEVOTHROID) 25 MCG tablet Take 1 tablet (25 mcg total) by mouth daily before breakfast.  . lidocaine (LIDODERM) 5 % Place 1 patch onto the skin daily as needed (pain). Hip  . neomycin-polymyxin-hydrocortisone (CORTISPORIN) 3.5-10000-1 otic suspension Place 1-2 drops into the left ear daily as needed (ear pain).   . pantoprazole (PROTONIX) 40 MG tablet TAKE 1 TABLET (40 MG TOTAL) BY MOUTH 2 (TWO) TIMES DAILY.  . tapentadol (NUCYNTA) 50 MG TABS tablet Take 50 mg by mouth every 6 (six) hours as needed for moderate pain.   . Topiramate ER 50 MG CP24 Take 50 mg by mouth daily as needed (migraines). Trokendi XR  . tretinoin (RETIN-A) 0.025 % cream Apply 1 application topically daily.  Marland Kitchen. triamcinolone cream (KENALOG) 0.1 % Apply 1 application topically daily as needed. Itching  . Tuberculin-Allergy Syringes 28G X 1/2" 1 ML MISC To use with injectable methotrexate once a week  . XERESE 5-1 % CREA Apply 1 application topically daily as needed (fever blisters).   . [DISCONTINUED] atorvastatin (LIPITOR) 80 MG tablet TAKE 1 TABLET BY MOUTH EVERY DAY AT 6PM   Allergies  Allergen Reactions  . Flu Virus Vaccine Anaphylaxis  . Influenza Vaccines Anaphylaxis   Past Medical History:  Diagnosis Date  . Anxiety   . Coronary artery calcification seen on CAT scan    minimal CAD with 30% prox and mild LAD  . Depression   . Diastolic dysfunction   . Dyslipidemia 01/30/2015  . Esophageal ring   . Fibromyalgia   . Hiatal hernia   . HSV-1 infection   . Morbid obesity (HCC)   . Rheumatoid arthritis(714.0)    M05.79  .  Rheumatoid arthritis, seropositive, 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HCC)    Treated with Orencia, TB neg 09/12/2015   Family History  Problem Relation Age of Onset  . Hyperlipidemia Mother   . Depression Mother   . Sarcoidosis Father   . Lung disease Father     Pleural Mesothelioma  . Cancer Father   . Heart attack Paternal Grandmother   . Hypertension Paternal Grandmother   . Hypertension Maternal Grandmother   . Diabetes Maternal Grandmother   . Stroke Neg Hx   . Colon cancer Neg Hx   . Esophageal cancer Neg Hx   . Rectal cancer Neg Hx   . Stomach cancer Neg Hx    Past Surgical History:  Procedure Laterality Date  . CESAREAN SECTION    . ESOPHAGEAL MANOMETRY N/A 10/28/2014   Procedure: ESOPHAGEAL MANOMETRY (EM);  Surgeon: Hilarie FredricksonJohn N Perry, MD;  Location: [facility];  Service: Endoscopy;  Laterality: N/A;  . HERNIA REPAIR     reports surgery on 3  hernias, with 2 more present  . LAPAROSCOPIC GASTRIC SLEEVE RESECTION  11/21/12   [facility]  . LEFT HEART CATHETERIZATION WITH CORONARY ANGIOGRAM N/A 08/26/2014   Procedure: LEFT HEART CATHETERIZATION WITH CORONARY ANGIOGRAM;  Surgeon: Runell Gess, MD;  Location: [facility] CATH LAB;  Service: Cardiovascular;  Laterality: N/A;  . TUBAL LIGATION     Social History   Social History  . Marital status: Married    Spouse name: [name]   . Number of children: 2  . Years of education: 16   Occupational History  . CUSTOMER SERVICE [employer]   Social History Main Topics  . Smoking status: Never Smoker  . Smokeless tobacco: Never Used  . Alcohol use No  . Drug use: No  . Sexual activity: Yes    Partners: Male    Birth control/ protection: None   Other Topics Concern  . Not on file   Social History Narrative   Marital Status: Married Probation officer)    Children:  Son [name]) Daughter [name])    Pets: None    Living Situation: Lives with husband and children    Occupation: Occupational psychologist Administrator)     Education:  Oncologist (Psychology)     Tobacco Use/Exposure:  None    Alcohol Use:  Occasional   Drug Use:  None   Diet:  Regular   Exercise: Walking or Treadmill (2 x week)   Hobbies: Clinical cytogeneticist, [employer].                 Review of Systems: General: negative for chills, fever, night sweats or weight changes.  Cardiovascular: negative for chest pain, dyspnea on exertion, edema, orthopnea, palpitations, paroxysmal nocturnal dyspnea or shortness of breath Dermatological: negative for rash Respiratory: negative for cough or wheezing Urologic: negative for hematuria Abdominal: negative for nausea, vomiting, diarrhea, bright red blood per rectum, melena, or hematemesis Neurologic: negative for visual changes, syncope, or dizziness All other systems reviewed and are otherwise negative except as noted above.   Physical Exam:  Blood pressure 126/82, pulse 97, height 5\' 2"  (1.575 m), weight 258 lb 6.4 oz (117.2 kg).  General appearance: alert, cooperative and no distress Neck: no carotid bruit and no JVD Lungs: clear to auscultation bilaterally Heart: regular rate and rhythm, S1, S2 normal, no murmur, click, rub or gallop Extremities: extremities normal, atraumatic, no cyanosis or edema Pulses: 2+ and symmetric Skin: Skin color, texture, turgor normal. No rashes or lesions Neurologic: Grossly normal  EKG not performed   Coronary CTA with Morphology 05/04/16  IMPRESSION: 1. Coronary calcium score of 171. All in proximal to mid LAD. This was 73 percentile for age and sex matched control.  2. Normal coronary origin with right dominance.  3. Diffuse long calcified plaque in proximal to mid LAD associated with 25-50% stenosis. Second diagonal branch has severe noncalcified ostial/proximal plaque with associated stenosis >70%.  Minimal plaque in RCA and LCX arteries.  4. Mildly dilated pulmonary artery measuring 31 mm suggestive of pulmonary hypertension.  ASSESSMENT  AND PLAN:   1. CAD: recent coronary CTA suggest CAD as outlined above. Medical therapy initially recommended. Pt is intolerant to Imdur due to side effects. She does not appear to be on a beta blocker. HR is 97 bpm and BP is 126/82. Will add low dose metoprolol, 12.5 mg BID, for antianginal therapy. This will also hopefully help with her palpitations (few PACs noted on recent monitor). F/u in 2 weeks for repeat assessment. Will discuss with  Dr. Mayford Knife, if definitive LHC with FFR testing of the diagnoal should be pursued if no notable improvement with metoprolol. I also advised that the patient start taking low dose ASA, 81 mg daily, given her coronary disease. Continue statin therapy with Lipitor.     Robbie Lis PA-C 05/20/2016 3:52 PM

## 2016-05-20 NOTE — Progress Notes (Deleted)
05/20/2016 [name] [name]   03-06-1966  [id number]  Primary Physician Birdena Jubilee, MD Primary Cardiologist: *** Electrophysiologist: ***  Reason for Visit/CC:   HPI:  ***  No outpatient prescriptions have been marked as taking for the 05/20/16 encounter (Appointment) with Allayne Butcher, PA-C.   Allergies  Allergen Reactions  . Flu Virus Vaccine Anaphylaxis  . Influenza Vaccines Anaphylaxis   Past Medical History:  Diagnosis Date  . Anxiety   . Coronary artery calcification seen on CAT scan    minimal CAD with 30% prox and mild LAD  . Depression   . Diastolic dysfunction   . Dyslipidemia 01/30/2015  . Esophageal ring   . Fibromyalgia   . Hiatal hernia   . HSV-1 infection   . Morbid obesity (HCC)   . Rheumatoid arthritis(714.0)    M05.79  . Rheumatoid arthritis, seropositive, 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HCC)    Treated with Orencia, TB neg 09/12/2015   Family History  Problem Relation Age of Onset  . Hyperlipidemia Mother   . Depression Mother   . Sarcoidosis Father   . Lung disease Father     Pleural Mesothelioma  . Cancer Father   . Heart attack Paternal Grandmother   . Hypertension Paternal Grandmother   . Hypertension Maternal Grandmother   . Diabetes Maternal Grandmother   . Stroke Neg Hx   . Colon cancer Neg Hx   . Esophageal cancer Neg Hx   . Rectal cancer Neg Hx   . Stomach cancer Neg Hx    Past Surgical History:  Procedure Laterality Date  . CESAREAN SECTION    . ESOPHAGEAL MANOMETRY N/A 10/28/2014   Procedure: ESOPHAGEAL MANOMETRY (EM);  Surgeon: Hilarie Fredrickson, MD;  Location: [facility];  Service: Endoscopy;  Laterality: N/A;  . HERNIA REPAIR     reports surgery on 3 hernias, with 2 more present  . LAPAROSCOPIC GASTRIC SLEEVE RESECTION  11/21/12   [facility]  . LEFT HEART CATHETERIZATION WITH CORONARY ANGIOGRAM N/A 08/26/2014   Procedure: LEFT HEART CATHETERIZATION WITH CORONARY ANGIOGRAM;  Surgeon: Runell Gess, MD;  Location: [facility] CATH  LAB;  Service: Cardiovascular;  Laterality: N/A;  . TUBAL LIGATION     Social History   Social History  . Marital status: Married    Spouse name: [name]   . Number of children: 2  . Years of education: 16   Occupational History  . CUSTOMER SERVICE [employer]   Social History Main Topics  . Smoking status: Never Smoker  . Smokeless tobacco: Never Used  . Alcohol use No  . Drug use: No  . Sexual activity: Yes    Partners: Male    Birth control/ protection: None   Other Topics Concern  . Not on file   Social History Narrative   Marital Status: Married Probation officer)    Children:  Son [name]) Daughter [name])    Pets: None    Living Situation: Lives with husband and children    Occupation: Occupational psychologist Administrator)     Education: Oncologist (Psychology)     Tobacco Use/Exposure:  None    Alcohol Use:  Occasional   Drug Use:  None   Diet:  Regular   Exercise: Walking or Treadmill (2 x week)   Hobbies: Clinical cytogeneticist, [employer].                 Review of Systems: General: negative for chills, fever, night sweats or weight changes.  Cardiovascular: negative for chest pain,  dyspnea on exertion, edema, orthopnea, palpitations, paroxysmal nocturnal dyspnea or shortness of breath Dermatological: negative for rash Respiratory: negative for cough or wheezing Urologic: negative for hematuria Abdominal: negative for nausea, vomiting, diarrhea, bright red blood per rectum, melena, or hematemesis Neurologic: negative for visual changes, syncope, or dizziness All other systems reviewed and are otherwise negative except as noted above.   Physical Exam:  There were no vitals taken for this visit.  {Physical LSLH:7342876}  EKG ***  ASSESSMENT AND PLAN:   No problem-specific Assessment & Plan notes found for this encounter.   PLAN  ***  Robbie Lis PA-C 05/20/2016 3:20 PM

## 2016-05-20 NOTE — Patient Instructions (Addendum)
Medication Instructions:   START TAKING METOPROLOL 12.5 MG TWICE A DAY   START TAKING  ASPIRIN 81 MG ONCE A DAY   START TAKING NITROGLYCERIN 0.4 MG SUBLINGUAL     If you need a refill on your cardiac medications before your next appointment, please call your pharmacy.  Labwork: NONE ORDERED  TODAY    Testing/Procedures:  NONE ORDERED  TODAY    Follow-Up:  IN 2 WEEKS WITH SIMMONS   Any Other Special Instructions Will Be Listed Below (If Applicable).

## 2016-05-24 ENCOUNTER — Encounter: Payer: Self-pay | Admitting: Cardiology

## 2016-05-25 ENCOUNTER — Ambulatory Visit: Payer: Self-pay | Admitting: Cardiology

## 2016-05-25 ENCOUNTER — Other Ambulatory Visit: Payer: Self-pay

## 2016-05-26 ENCOUNTER — Telehealth: Payer: Self-pay | Admitting: Rheumatology

## 2016-05-26 ENCOUNTER — Telehealth: Payer: Self-pay

## 2016-05-26 NOTE — Telephone Encounter (Signed)
Called to check on patient. She reports no improvement in symptoms with addition of metoprolol.  She "feels exactly the same as at visit- bad."  Offered to schedule patient with APP to discuss catheterization, but she states she rather simply schedule the cath if possible.  She states she had a cath about 5 years ago and understands risks and benefits- she "just wants to get it done."  To B. Sharol Harness, PA for OK to schedule.

## 2016-05-26 NOTE — Telephone Encounter (Signed)
-----   Message from Brittainy M Simmons, PA-C sent at 05/24/2016  9:32 AM EST ----- Per Dr.Turner, we should proceed with cath if patient is still having symptoms. Can you please f/u with her to see if any improvement with addition of metoprolol? If still having symptoms then we can have her f/u to discuss cath. Thanks.  

## 2016-05-26 NOTE — Telephone Encounter (Signed)
Spoke with Ms. Turri about her Orencia infusion from 04/07/16. She says she is receiving bills for the procedure. Her authorization was approved from 04/23/16 through 06/06/16. She is requesting that we call [employer] to get her approval retroactive to 04/07/16.   Called [employer] and confirmed that it could be done. Was informed by a representative that it would require an appeal (not commonly done by the company). Patient will need to sign and fax a release form and we will fax the appeal form in as well. Left message for patient to call back to update her about the process. Representative also stated that it would take up to 30 calender days before we would receive and answer.  Victoriana Aziz, [place], CPhT

## 2016-05-26 NOTE — Telephone Encounter (Deleted)
-----   Message from Allayne Butcher, [place] sent at 05/24/2016  9:32 AM EST ----- Per Dr.Turner, we should proceed with cath if patient is still having symptoms. Can you please f/u with her to see if any improvement with addition of metoprolol? If still having symptoms then we can have her f/u to discuss cath. Thanks.

## 2016-05-26 NOTE — Telephone Encounter (Signed)
Patient states she has an Orencia infusion scheduled for tomorrow and her new insurance needs an authorization. Patient's new insurance is [employer].

## 2016-05-26 NOTE — Telephone Encounter (Signed)
Contacted [employer] and she is authorized from 04/23/16-06/06/16 so she is good for her 12/21 infusion. Have contacted the patient and she is aware.

## 2016-05-27 ENCOUNTER — Ambulatory Visit (HOSPITAL_COMMUNITY): Admission: RE | Admit: 2016-05-27 | Payer: BLUE CROSS/BLUE SHIELD | Source: Ambulatory Visit

## 2016-06-02 ENCOUNTER — Telehealth: Payer: Self-pay

## 2016-06-02 ENCOUNTER — Ambulatory Visit: Payer: Self-pay | Admitting: Rheumatology

## 2016-06-02 NOTE — Telephone Encounter (Signed)
She still needs a visit with APP for cath workups

## 2016-06-02 NOTE — Telephone Encounter (Signed)
Received a fax from [facility] stating the patient does not have a new plan with the company for 2018. Pt is scheduled to infuse orencia on 06/08/16.   Called pt to verify new insurance plan for 2018. Could not leave a message, mailbox was full.

## 2016-06-03 ENCOUNTER — Telehealth: Payer: Self-pay

## 2016-06-03 NOTE — Progress Notes (Signed)
Cardiology Office Note    Date:  06/04/2016   ID:  [name] [name], DOB [birth year], MRN [id number]  PCP:  Birdena Jubilee, MD  Cardiologist: Dr.Turner  Chief Complaint: Cath discussion   History of Present Illness:   [name] [name] is a 50 y.o. female mild non obstructive CAD, HLD, chronic diastolic CHF, RA, depression and recent chest pain presents for cath discussion.   She had cardiac workup with nuclear stress test and echo were normal with diastolic dysfunction and elevated filling pressures on echo in 2014. A coronary CTA showed calcium score in the 95th% but no obstructive ASCAD (30% prox LAD stenosis).   She was evaluated again by Dr. Mayford Knife 08/22/14 with recurrent chest discomfort.  Her symptoms are quite severe in the office and she was transported to the hospital for further evaluation.  Cardiac enzymes remained normal. Cardiac catheterization demonstrated normal coronary arteries. Proton pump inhibitor was started.  Most recently 02/2016, she developed palpitations and recurrent CP leading to repeat assessment. Cardiac monitor showed some PACs but no other significant arrhythmias. She had a NST on 03/01/16 that showed a fixed small, mild apical perfusion defect may be due to attenuation given normal wall motion. There was also a small, mild reversible basal anterolateral perfusion defect. Ischemia could not be ruled out. Subsequently, Dr. Mayford Knife ordered for her to undergo a repeat coronary CTA with morphology. This was performed 05/04/16 and demonstrated a coronary calcium score of 171. All in proximal to mid LAD. This was 55 percentile for age and sex matched control. Normal coronary origin with right dominance. Diffuse long calcified plaque in proximal to mid LAD associated with 25-50% stenosis. Second diagonal branch has severe noncalcified ostial/proximal plaque with associated stenosis >70%. Minimal plaque in RCA and LCX arteries.  Mildly dilated pulmonary artery  measuring 31 mm suggestive of pulmonary hypertension.  Low dose Imdur added to chest pain due to CTA findings. However unable to tolerate. Seen in clinic by Robbie Lis 05/20/16 for ongoing chest pain and palpitations. BB and ASA added to regimen. Due to ongoing symptoms plan made for cath for definite evaluation of coronary anatomy.   She continues to feeling of pounding sensation and squeezing chest tightness multiple times in a day. This lasts for one to 2 minutes and resolved by itself. Associated shortness of breath. No dizziness, syncope, lower extremity edema, orthopnea, PND, melena or blood in her stool or urine. Compliant with her medication.   Past Medical History:  Diagnosis Date  . Anxiety   . Coronary artery calcification seen on CAT scan    minimal CAD with 30% prox and mild LAD  . Depression   . Diastolic dysfunction   . Dyslipidemia 01/30/2015  . Esophageal ring   . Fibromyalgia   . Hiatal hernia   . HSV-1 infection   . Morbid obesity (HCC)   . Rheumatoid arthritis(714.0)    M05.79  . Rheumatoid arthritis, seropositive, 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HCC)    Treated with Orencia, TB neg 09/12/2015    Past Surgical History:  Procedure Laterality Date  . CESAREAN SECTION    . ESOPHAGEAL MANOMETRY N/A 10/28/2014   Procedure: ESOPHAGEAL MANOMETRY (EM);  Surgeon: Hilarie Fredrickson, MD;  Location: [facility];  Service: Endoscopy;  Laterality: N/A;  . HERNIA REPAIR     reports surgery on 3 hernias, with 2 more present  . LAPAROSCOPIC GASTRIC SLEEVE RESECTION  11/21/12   [facility]  . LEFT HEART CATHETERIZATION WITH CORONARY ANGIOGRAM N/A 08/26/2014  Procedure: LEFT HEART CATHETERIZATION WITH CORONARY ANGIOGRAM;  Surgeon: Runell Gess, MD;  Location: [facility] CATH LAB;  Service: Cardiovascular;  Laterality: N/A;  . TUBAL LIGATION      Current Medications: Prior to Admission medications   Medication Sig Start Date End Date Taking? Authorizing Provider  Adapalene 0.3 % gel Apply 1  application topically daily as needed (acne).  12/31/14   Historical Provider, MD  ARIPiprazole (ABILIFY) 2 MG tablet Take 1 tablet (2 mg total) by mouth daily. 04/22/15   Benjaman Pott, MD  aspirin EC 81 MG tablet Take 81 mg by mouth daily.    Historical Provider, MD  atorvastatin (LIPITOR) 80 MG tablet Take 1 tablet (80 mg total) by mouth daily at 6 PM. 05/20/16   Brittainy M Sharol Harness, PA-C  Betamethasone Valerate 0.12 % foam Apply 1 application topically daily as needed. Itching 04/22/15   Historical Provider, MD  buPROPion (WELLBUTRIN XL) 300 MG 24 hr tablet Take 1 tablet (300 mg total) by mouth daily. 12/28/14   Adonis Brook, NP  CARAFATE 1 GM/10ML suspension TAKE 10 MLS (1 G TOTAL) BY MOUTH 4 (FOUR) TIMES DAILY - WITH MEALS AND AT BEDTIME. 11/18/14   Historical Provider, MD  clindamycin-benzoyl peroxide (BENZACLIN) gel Apply 1 application topically daily as needed. Acne 02/13/15   Historical Provider, MD  clobetasol cream (TEMOVATE) 0.05 % Apply 1 application topically daily as needed. Skin discoloration 04/22/15   Historical Provider, MD  cyclobenzaprine (FLEXERIL) 10 MG tablet TAKE 1 TABLET BY MOUTH EVERY 6-8 HOURS AS NEEDED FOR MUSCLE SPASMS 01/22/15   Historical Provider, MD  diclofenac sodium (VOLTAREN) 1 % GEL Apply 2 g topically 2 (two) times daily as needed (pain).  01/28/15   Historical Provider, MD  diltiazem (CARTIA XT) 180 MG 24 hr capsule Take 1 capsule (180 mg total) by mouth daily. 05/11/16   Quintella Reichert, MD  doxepin (SINEQUAN) 10 MG capsule Take 1 capsule (10 mg total) by mouth at bedtime. Patient taking differently: Take 10 mg by mouth at bedtime as needed (sleep).  12/28/14   Adonis Brook, NP  DULoxetine (CYMBALTA) 30 MG capsule Take 30 mg by mouth 2 (two) times daily.  12/30/14   Historical Provider, MD  EPIPEN 2-PAK 0.3 MG/0.3ML SOAJ injection Inject 0.3 mg into the skin once.  12/30/14   Historical Provider, MD  famciclovir (FAMVIR) 500 MG tablet TAKE 1 TAB BY MOUTH DAILY FOR  PREVENTION OR 3 TABS AT ONSET OF FEVER BLISTER X 1 DAY 01/21/15   Historical Provider, MD  FLUoxetine (PROZAC) 10 MG capsule Take 10 mg by mouth daily.    Historical Provider, MD  fluticasone (FLONASE) 50 MCG/ACT nasal spray Place 2 sprays into both nostrils as needed for allergies. 12/28/14 02/17/16  Adonis Brook, NP  gabapentin (NEURONTIN) 300 MG capsule Take 1 capsule (300 mg total) by mouth 3 (three) times daily. 04/22/15   Benjaman Pott, MD  HYDROcodone-acetaminophen (NORCO/VICODIN) 5-325 MG tablet Take 1-2 tablets by mouth every 6 (six) hours as needed for moderate pain. 09/03/15   Cathren Laine, MD  hydrOXYzine (VISTARIL) 25 MG capsule Take 25 mg by mouth every 8 (eight) hours as needed for anxiety.  12/30/14   Historical Provider, MD  isosorbide mononitrate (IMDUR) 30 MG 24 hr tablet Take 0.5 tablets (15 mg total) by mouth daily. 05/11/16 05/06/17  Quintella Reichert, MD  levothyroxine (SYNTHROID, LEVOTHROID) 25 MCG tablet Take 1 tablet (25 mcg total) by mouth daily before breakfast. 12/28/14   Velna Hatchet  Agustin, NP  lidocaine (LIDODERM) 5 % Place 1 patch onto the skin daily as needed (pain). Hip 03/29/15   Historical Provider, MD  metoprolol tartrate (LOPRESSOR) 25 MG tablet Take 0.5 tablets (12.5 mg total) by mouth 2 (two) times daily. 05/20/16   Brittainy M Simmons, PA-C  neomycin-polymyxin-hydrocortisone (CORTISPORIN) 3.5-10000-1 otic suspension Place 1-2 drops into the left ear daily as needed (ear pain).  04/09/15   Historical Provider, MD  nitroGLYCERIN (NITROSTAT) 0.4 MG SL tablet Place 1 tablet (0.4 mg total) under the tongue every 5 (five) minutes as needed for chest pain. 05/20/16   Brittainy M Simmons, PA-C  pantoprazole (PROTONIX) 40 MG tablet TAKE 1 TABLET (40 MG TOTAL) BY MOUTH 2 (TWO) TIMES DAILY. 02/03/16   Amy S Esterwood, PA-C  tapentadol (NUCYNTA) 50 MG TABS tablet Take 50 mg by mouth every 6 (six) hours as needed for moderate pain.     Historical Provider, MD  Topiramate ER 50 MG CP24  Take 50 mg by mouth daily as needed (migraines). Trokendi XR 12/28/14   Sheila Agustin, NP  tretinoin (RETIN-A) 0.025 % cream Apply 1 application topically daily. 02/11/15   Historical Provider, MD  triamcinolone cream (KENALOG) 0.1 % Apply 1 application topically daily as needed. Itching 04/13/15   Historical Provider, MD  Tuberculin-Allergy Syringes 28G X 1/2" 1 ML MISC To use with injectable methotrexate once a week 04/28/16   Shaili Deveshwar, MD  XERESE 5-1 % CREA Apply 1 application topically daily as needed (fever blisters).  12/30/14   Historical Provider, MD    Allergies:   Flu virus vaccine and Influenza vaccines   Social History   Social History  . Marital status: Married    Spouse name: Christopher   . Number of children: 2  . Years of education: 16   Occupational History  . CUSTOMER SERVICE [employer]   Social History Main Topics  . Smoking status: Never Smoker  . Smokeless tobacco: Never Used  . Alcohol use No  . Drug use: No  . Sexual activity: Yes    Partners: Male    Birth control/ protection: None   Other Topics Concern  . 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                 Family History:  The patient's family history includes Cancer in her father; Depression in her mother; Diabetes in her maternal grandmother; Heart attack in her paternal grandmother; Hyperlipidemia in her mother; Hypertension in her maternal grandmother and paternal grandmother; Lung disease in her father; Sarcoidosis in her father.   ROS:     Please see the history of present illness.    ROS All other systems reviewed and are negative.   PHYSICAL EXAM:   VS:  BP 120/70   Pulse 82   Ht 5' 2" (1.575 m)   Wt 261 lb (118.4 kg)   LMP  (LMP Unknown)   SpO2 98%   BMI 47.74 kg/m      GEN: Well nourished, well developed, in no acute distress  HEENT: normal  Neck: no JVD, carotid bruits, or masses Cardiac: RRR; no murmurs, rubs, or gallops,no edema  Respiratory:  clear to auscultation bilaterally, normal work of breathing GI: soft, nontender, nondistended, + BS MS: no deformity or atrophy  Skin: warm and dry, no rash Neuro:  Alert and Oriented x 3, Strength and sensation are intact Psych: euthymic mood, full affect  Wt Readings from Last 3 Encounters:  06/04/16 261 lb (118.4 kg)  05/20/16 258 lb 6.4 oz (117.2 kg)  04/07/16 252 lb (114.3 kg)      Studies/Labs Reviewed:   EKG:  EKG is not ordered today.   Recent Labs: 04/07/2016: ALT 17; BUN 6; Creatinine, Ser 0.97; Hemoglobin 13.2; Platelets 291; Potassium 3.7; Sodium 142   Lipid Panel    Component Value Date/Time   CHOL 152 02/06/2015 1055   TRIG 82.0 02/06/2015 1055   HDL 55.50 02/06/2015 1055   CHOLHDL 3 02/06/2015 1055   VLDL 16.4 02/06/2015 1055   LDLCALC 80 02/06/2015 1055    Additional studies/ records that were reviewed today include:   Myoview 03/02/16  The left ventricular ejection fraction is normal (55-65%).  Nuclear stress EF: 63%.  There was no ST segment deviation noted during stress.  The study is normal.  This is a low risk study.   1. EF 63% with normal wall motion.  2. Fixed small, mild apical perfusion defect may be due to attenuation given normal wall motion.  There is also a small, mild reversible basal anterolateral perfusion defect.  Cannot rule out small area of ischemia.   Overall low risk study.   Monitor 03/02/16 Highlights    Normal sinus rhythm and sinus tachycardia with average heart rate 92bpm. The heart rate  ranged from 70-120bpm.  Rare PAC      Echocardiogram:05/10/13 LV EF: 50% -  55%  ------------------------------------------------------------ Indications:   Chest pain 786.51. Shortness of breath 786.05.  ------------------------------------------------------------ History:  PMH: Elevated troponin. Acquired from the patient and from the patient's chart. Chest pain. Abdominal pain. Back pain. Dyspnea. Risk factors: Morbidly obese.  ------------------------------------------------------------ Study Conclusions  - Left ventricle: The cavity size was normal. Wall thickness was normal. Systolic function was normal. The estimated ejection fraction was in the range of 50% to 55%. Doppler parameters are consistent with abnormal left ventricular relaxation (grade 1 diastolic dysfunction). Doppler parameters are consistent with high ventricular filling pressure. - Left atrium: The atrium was mildly dilated.  Cardiac Catheterization: 08/26/14 HEMODYNAMICS:    AO SYSTOLIC/AO DIASTOLIC: 141/89   LV SYSTOLIC/LV DIASTOLIC: 145/23  ANGIOGRAPHIC RESULTS:   1. Left main; normal  2. LAD; normal 3. Left circumflex; normal.  4. Right coronary artery; dominant and normal 5. Left ventriculography; RAO left ventriculogram was performed using  25 mL of Visipaque dye at 12 mL/second. The overall LVEF estimated  60 %  Without wall motion abnormalities  IMPRESSION:Mrs. Valenta has normal coronary arteries and normal LV function. Her chest pain was noncardiac. A femoral angiogram was performed with the intention to close her femoral puncture site with a "minx closure device" however I puncture the origin of the profunda therefore making that not possible. Patient will be gently hydrated, remain recumbent for 4 hours and then will be discharged home. She will follow-up with the mid-level provider in one to 2 weeks after that when necessary. She'll be treated with antireflux  measures.    ASSESSMENT & PLAN:    1. Atypical  Chest Pain/Coronary Calcification on CT - Plan for cath for definite evaluation of coronary anatomy. Continue ASA, statin and BB. Intolerant to Imdur.   The patient understands that risks include but are not limited to stroke (1 in 1000), death (1 in 1000), kidney failure [usually temporary] (1 in 500), bleeding (1 in 200), allergic reaction [possibly serious] (1 in 200), and agrees to proceed.   2. Palpitations - Cardiac monitor showed some PACs but no other significant arrhythmias. Continues to have sensation of "punding with chest tightness". Will increase metoprolol to 25mg  BID.   3. HTN - Stable and well controlled on current medications.  4. HL - Continue statin. Followed by PCP.    Medication Adjustments/Labs and Tests Ordered: Current medicines are reviewed at length with the patient today.  Concerns regarding medicines are outlined above.  Medication changes, Labs and Tests ordered today are listed in the Patient Instructions below. Patient Instructions  Medication Instructions:    Labwork: TODAY BMET, CBC, PT/INR  Testing/Procedures: Your physician has requested that you have a cardiac catheterization. Cardiac catheterization is used to diagnose and/or treat various heart conditions. Doctors may recommend this procedure for a number of different reasons. The most common reason is to evaluate chest pain. Chest pain can be a symptom of coronary artery disease (CAD), and cardiac catheterization can show whether plaque is narrowing or blocking your heart's arteries. This procedure is also used to evaluate the valves, as well as measure the blood flow and oxygen levels in different parts of your heart. For further information please visit [email or web address] Please follow instruction sheet, as given.   Follow-Up:   Any Other Special Instructions Will Be Listed Below (If Applicable).     If you need a refill on your  cardiac medications before your next appointment, please call your pharmacy.      Lorelei Pont, [place]  06/04/2016 4:04 PM    [facility] [address] [place], [place], [place]  [address] Phone: [phone]; Fax: [phone]

## 2016-06-03 NOTE — Telephone Encounter (Signed)
Spoke with [name] about her denial from [place]  For Orencia infusion stating that she had no active coverage for 2018. Patient said she will contact insurance agent can call me back later in the day with an update. Her next infusion is scheduled for 06/08/16.   Chinaza Rooke, [place], CPhT

## 2016-06-03 NOTE — Telephone Encounter (Signed)
Scheduled patient tomorrow, 12/29 with Chelsea Aus for cath work-up.

## 2016-06-04 ENCOUNTER — Encounter: Payer: Self-pay | Admitting: *Deleted

## 2016-06-04 ENCOUNTER — Other Ambulatory Visit (HOSPITAL_COMMUNITY): Payer: Self-pay | Admitting: *Deleted

## 2016-06-04 ENCOUNTER — Ambulatory Visit (INDEPENDENT_AMBULATORY_CARE_PROVIDER_SITE_OTHER): Payer: BLUE CROSS/BLUE SHIELD | Admitting: Physician Assistant

## 2016-06-04 ENCOUNTER — Telehealth: Payer: Self-pay

## 2016-06-04 VITALS — BP 120/70 | HR 82 | Ht 62.0 in | Wt 261.0 lb

## 2016-06-04 DIAGNOSIS — I2510 Atherosclerotic heart disease of native coronary artery without angina pectoris: Secondary | ICD-10-CM | POA: Diagnosis not present

## 2016-06-04 DIAGNOSIS — R002 Palpitations: Secondary | ICD-10-CM | POA: Diagnosis not present

## 2016-06-04 DIAGNOSIS — I10 Essential (primary) hypertension: Secondary | ICD-10-CM | POA: Diagnosis not present

## 2016-06-04 DIAGNOSIS — E785 Hyperlipidemia, unspecified: Secondary | ICD-10-CM

## 2016-06-04 LAB — CBC
HCT: 42.6 % (ref 35.0–45.0)
Hemoglobin: 13.7 g/dL (ref 11.7–15.5)
MCH: 26.6 pg — AB (ref 27.0–33.0)
MCHC: 32.2 g/dL (ref 32.0–36.0)
MCV: 82.6 fL (ref 80.0–100.0)
MPV: 9.3 fL (ref 7.5–12.5)
Platelets: 328 10*3/uL (ref 140–400)
RBC: 5.16 MIL/uL — ABNORMAL HIGH (ref 3.80–5.10)
RDW: 13 % (ref 11.0–15.0)
WBC: 10.1 10*3/uL (ref 3.8–10.8)

## 2016-06-04 MED ORDER — METOPROLOL TARTRATE 25 MG PO TABS: 25.0000 mg | ORAL_TABLET | Freq: Two times a day (BID) | ORAL | 3 refills | Status: DC

## 2016-06-04 NOTE — Telephone Encounter (Signed)
Spoke with patient about her insurance policy for 2018. She says her agent enrolled her into a new program with [employer] with a lower deductible. She has not received her card yet but hoping they will expedite it so she can receive her new card and ID number. We will need this information in order to submit a prior authorization for her infusion that is scheduled for 1/2. Patient is aware she will need to re-schedule her appointment for a later date.   We received her signed copy of consent to be sent along with her appeal form to be sent to [facility].   Form was faxed today and will take at least 30 calendar days to be processed.   Forms will be scanned in epic.  Arpi Diebold, [place], CPhT

## 2016-06-04 NOTE — Patient Instructions (Addendum)
Medication Instructions:  1. INCREASE METOPROLOL TARTRATE TO 25 MG TWICE DAILY  Labwork: TODAY BMET, CBC, PT/INR  Testing/Procedures: Your physician has requested that you have a cardiac catheterization. Cardiac catheterization is used to diagnose and/or treat various heart conditions. Doctors may recommend this procedure for a number of different reasons. The most common reason is to evaluate chest pain. Chest pain can be a symptom of coronary artery disease (CAD), and cardiac catheterization can show whether plaque is narrowing or blocking your heart's arteries. This procedure is also used to evaluate the valves, as well as measure the blood flow and oxygen levels in different parts of your heart. For further information please visit [email or web address] Please follow instruction sheet, as given.   Follow-Up: YOU WILL NEED A  2 WEEK FOLLOW UP POST PROCEDURE WITH EITHER VIN BHAGAT, PA OR BRITTANY SIMMONS SAME DAY DR. Mayford Knife IS INT HE OFFICE   Any Other Special Instructions Will Be Listed Below (If Applicable).     If you need a refill on your cardiac medications before your next appointment, please call your pharmacy.

## 2016-06-05 LAB — BASIC METABOLIC PANEL
BUN: 11 mg/dL (ref 7–25)
CHLORIDE: 105 mmol/L (ref 98–110)
CO2: 25 mmol/L (ref 20–31)
Calcium: 9.1 mg/dL (ref 8.6–10.2)
Creat: 0.79 mg/dL (ref 0.50–1.10)
GLUCOSE: 82 mg/dL (ref 65–99)
Potassium: 4 mmol/L (ref 3.5–5.3)
Sodium: 141 mmol/L (ref 135–146)

## 2016-06-05 LAB — PROTIME-INR
INR: 1
Prothrombin Time: 10.5 s (ref 9.0–11.5)

## 2016-06-08 ENCOUNTER — Telehealth: Payer: Self-pay

## 2016-06-08 ENCOUNTER — Ambulatory Visit (HOSPITAL_COMMUNITY): Admission: RE | Admit: 2016-06-08 | Payer: BLUE CROSS/BLUE SHIELD | Source: Ambulatory Visit

## 2016-06-08 NOTE — Telephone Encounter (Signed)
Patient called to give her new insurance ID number for 2018. The only difference is one of the leading letters has been change from YPI to YPA, the numbers are the same.   A prior authorizaiton for her Orencia infusion has been submitted to [facility] and we will contact patient when we receive the results.   Patient will contact Laverne to reschedule appointment.   Documents will be scanned.   [name] [name], [place]

## 2016-06-09 ENCOUNTER — Ambulatory Visit: Payer: Self-pay | Admitting: Cardiology

## 2016-06-09 ENCOUNTER — Telehealth: Payer: Self-pay

## 2016-06-09 NOTE — Telephone Encounter (Signed)
Received a denial fax from [facility] for patients appeal for the Orencia infusion from 04/07/16. Claim states that the forms were submitted to wrong office.   Spoke with patient to get customer service number and verify ID from her insurance card for 2017.   ID: [id number] (Started 04/07/16) [employer] Customer Service Number: [phone]  Spoke with representative from the number provided and got the right number to fax and verified which forms needed to be sent.   Sent correct forms to [employer] at [phone]  Will scan document in epic.  Andrius Andrepont, [place], CPhT

## 2016-06-10 ENCOUNTER — Telehealth: Payer: Self-pay

## 2016-06-10 NOTE — Telephone Encounter (Signed)
Received a fax from [facility] confirming the approval for patients Orencia Infusion from 06/08/16-06/08/17 The approval was for "Office" and it should have been for "outpatient." Tried to call [employer] to have it changed but the office was closed due to the weather. Will call again on 1/5 to get it changed to outpatient service.   Reference number #[id number]   Informed the patient it was approved for a total of 1,300. I will verify with [employer] what that means as far as her out of pocket expense was on tomorrow.  Pt voiced understanding.She states that she was once enrolled into the orencia patient assistance program and insisted on calling them together while on the phone. Representative informed pt that a paper copy would need to be sent in with a signed consent for renewal/new applications.   Pt will contact me if she needs anymore information.   Will scan document in epic  [place], [place], CPhT

## 2016-06-11 ENCOUNTER — Ambulatory Visit (HOSPITAL_COMMUNITY)
Admission: RE | Admit: 2016-06-11 | Discharge: 2016-06-11 | Disposition: A | Discharge status: Home / Self Care | Payer: BLUE CROSS/BLUE SHIELD | Source: Ambulatory Visit | Attending: Interventional Cardiology | Admitting: Interventional Cardiology

## 2016-06-11 ENCOUNTER — Encounter (HOSPITAL_COMMUNITY)
Admission: RE | Disposition: A | Discharge status: Home / Self Care | Payer: Self-pay | Source: Ambulatory Visit | Attending: Interventional Cardiology

## 2016-06-11 ENCOUNTER — Encounter (HOSPITAL_COMMUNITY): Payer: Self-pay | Admitting: Interventional Cardiology

## 2016-06-11 ENCOUNTER — Telehealth: Payer: Self-pay

## 2016-06-11 DIAGNOSIS — Z833 Family history of diabetes mellitus: Secondary | ICD-10-CM | POA: Insufficient documentation

## 2016-06-11 DIAGNOSIS — Z9884 Bariatric surgery status: Secondary | ICD-10-CM | POA: Insufficient documentation

## 2016-06-11 DIAGNOSIS — E785 Hyperlipidemia, unspecified: Secondary | ICD-10-CM | POA: Insufficient documentation

## 2016-06-11 DIAGNOSIS — Z6841 Body Mass Index (BMI) 40.0 and over, adult: Secondary | ICD-10-CM | POA: Diagnosis not present

## 2016-06-11 DIAGNOSIS — I2510 Atherosclerotic heart disease of native coronary artery without angina pectoris: Secondary | ICD-10-CM | POA: Insufficient documentation

## 2016-06-11 DIAGNOSIS — I110 Hypertensive heart disease with heart failure: Secondary | ICD-10-CM | POA: Diagnosis not present

## 2016-06-11 DIAGNOSIS — R931 Abnormal findings on diagnostic imaging of heart and coronary circulation: Secondary | ICD-10-CM | POA: Diagnosis not present

## 2016-06-11 DIAGNOSIS — M069 Rheumatoid arthritis, unspecified: Secondary | ICD-10-CM | POA: Diagnosis not present

## 2016-06-11 DIAGNOSIS — Z7951 Long term (current) use of inhaled steroids: Secondary | ICD-10-CM | POA: Insufficient documentation

## 2016-06-11 DIAGNOSIS — R002 Palpitations: Secondary | ICD-10-CM | POA: Insufficient documentation

## 2016-06-11 DIAGNOSIS — Z8249 Family history of ischemic heart disease and other diseases of the circulatory system: Secondary | ICD-10-CM | POA: Insufficient documentation

## 2016-06-11 DIAGNOSIS — Z7982 Long term (current) use of aspirin: Secondary | ICD-10-CM | POA: Insufficient documentation

## 2016-06-11 DIAGNOSIS — M797 Fibromyalgia: Secondary | ICD-10-CM | POA: Insufficient documentation

## 2016-06-11 DIAGNOSIS — F329 Major depressive disorder, single episode, unspecified: Secondary | ICD-10-CM | POA: Diagnosis not present

## 2016-06-11 DIAGNOSIS — I5032 Chronic diastolic (congestive) heart failure: Secondary | ICD-10-CM | POA: Diagnosis not present

## 2016-06-11 DIAGNOSIS — F419 Anxiety disorder, unspecified: Secondary | ICD-10-CM | POA: Insufficient documentation

## 2016-06-11 DIAGNOSIS — K449 Diaphragmatic hernia without obstruction or gangrene: Secondary | ICD-10-CM | POA: Insufficient documentation

## 2016-06-11 DIAGNOSIS — R0789 Other chest pain: Secondary | ICD-10-CM | POA: Insufficient documentation

## 2016-06-11 HISTORY — PX: CARDIAC CATHETERIZATION: SHX172

## 2016-06-11 SURGERY — LEFT HEART CATH AND CORONARY ANGIOGRAPHY
Anesthesia: LOCAL

## 2016-06-11 MED ORDER — SODIUM CHLORIDE 0.9 % IV SOLN: INTRAVENOUS | Status: DC

## 2016-06-11 MED ORDER — ASPIRIN 81 MG PO CHEW
CHEWABLE_TABLET | ORAL | Status: AC
  Administered 2016-06-11: 81 mg via ORAL
  Filled 2016-06-11: qty 1

## 2016-06-11 MED ORDER — SODIUM CHLORIDE 0.9% FLUSH: 3.0000 mL | INTRAVENOUS | Status: DC | PRN

## 2016-06-11 MED ORDER — LIDOCAINE HCL (PF) 1 % IJ SOLN
INTRAMUSCULAR | Status: DC | PRN
  Administered 2016-06-11: 2 mL

## 2016-06-11 MED ORDER — HYDROCODONE-ACETAMINOPHEN 5-325 MG PO TABS
2.0000 | ORAL_TABLET | Freq: Once | ORAL | Status: AC
  Administered 2016-06-11: 2 via ORAL

## 2016-06-11 MED ORDER — IOPAMIDOL (ISOVUE-370) INJECTION 76%
INTRAVENOUS | Status: AC
  Filled 2016-06-11: qty 100

## 2016-06-11 MED ORDER — FENTANYL CITRATE (PF) 100 MCG/2ML IJ SOLN
INTRAMUSCULAR | Status: DC | PRN
  Administered 2016-06-11: 25 ug via INTRAVENOUS
  Administered 2016-06-11: 50 ug via INTRAVENOUS
  Administered 2016-06-11: 25 ug via INTRAVENOUS

## 2016-06-11 MED ORDER — SODIUM CHLORIDE 0.9% FLUSH: 3.0000 mL | Freq: Two times a day (BID) | INTRAVENOUS | Status: DC

## 2016-06-11 MED ORDER — MIDAZOLAM HCL 2 MG/2ML IJ SOLN
INTRAMUSCULAR | Status: DC | PRN
  Administered 2016-06-11 (×2): 1 mg via INTRAVENOUS
  Administered 2016-06-11: 2 mg via INTRAVENOUS

## 2016-06-11 MED ORDER — ASPIRIN 81 MG PO CHEW
81.0000 mg | CHEWABLE_TABLET | ORAL | Status: AC
Start: 2016-06-11 — End: 2016-06-11
  Administered 2016-06-11: 81 mg via ORAL

## 2016-06-11 MED ORDER — LIDOCAINE HCL (PF) 1 % IJ SOLN
INTRAMUSCULAR | Status: AC
  Filled 2016-06-11: qty 30

## 2016-06-11 MED ORDER — HEPARIN SODIUM (PORCINE) 1000 UNIT/ML IJ SOLN
INTRAMUSCULAR | Status: DC | PRN
  Administered 2016-06-11: 6000 [IU] via INTRAVENOUS

## 2016-06-11 MED ORDER — HYDROCODONE-ACETAMINOPHEN 5-325 MG PO TABS
ORAL_TABLET | ORAL | Status: AC
  Filled 2016-06-11: qty 2

## 2016-06-11 MED ORDER — SODIUM CHLORIDE 0.9 % WEIGHT BASED INFUSION
3.0000 mL/kg/h | INTRAVENOUS | Status: AC
  Administered 2016-06-11: 3 mL/kg/h via INTRAVENOUS

## 2016-06-11 MED ORDER — HEPARIN (PORCINE) IN NACL 2-0.9 UNIT/ML-% IJ SOLN
INTRAMUSCULAR | Status: AC
  Filled 2016-06-11: qty 1000

## 2016-06-11 MED ORDER — SODIUM CHLORIDE 0.9 % IV SOLN: 250.0000 mL | INTRAVENOUS | Status: DC | PRN

## 2016-06-11 MED ORDER — IOPAMIDOL (ISOVUE-370) INJECTION 76%
INTRAVENOUS | Status: DC | PRN
  Administered 2016-06-11: 70 mL via INTRA_ARTERIAL

## 2016-06-11 MED ORDER — FENTANYL CITRATE (PF) 100 MCG/2ML IJ SOLN
INTRAMUSCULAR | Status: AC
  Filled 2016-06-11: qty 2

## 2016-06-11 MED ORDER — MIDAZOLAM HCL 2 MG/2ML IJ SOLN
INTRAMUSCULAR | Status: AC
  Filled 2016-06-11: qty 2

## 2016-06-11 MED ORDER — VERAPAMIL HCL 2.5 MG/ML IV SOLN
INTRAVENOUS | Status: AC
  Filled 2016-06-11: qty 2

## 2016-06-11 MED ORDER — SODIUM CHLORIDE 0.9 % WEIGHT BASED INFUSION: 1.0000 mL/kg/h | INTRAVENOUS | Status: DC

## 2016-06-11 MED ORDER — HEPARIN (PORCINE) IN NACL 2-0.9 UNIT/ML-% IJ SOLN
INTRAMUSCULAR | Status: DC | PRN
  Administered 2016-06-11: 1000 mL

## 2016-06-11 MED ORDER — HEPARIN SODIUM (PORCINE) 1000 UNIT/ML IJ SOLN
INTRAMUSCULAR | Status: AC
  Filled 2016-06-11: qty 1

## 2016-06-11 SURGICAL SUPPLY — 12 items
CATH EXPO 5F FL3.5 (CATHETERS) ×1 IMPLANT
CATH EXPO 5FR FR4 (CATHETERS) ×1 IMPLANT
CATH LAUNCHER 5F EBU3.0 (CATHETERS) IMPLANT
CATHETER LAUNCHER 5F EBU3.0 (CATHETERS) ×2
DEVICE RAD COMP TR BAND LRG (VASCULAR PRODUCTS) ×1 IMPLANT
GLIDESHEATH SLEND SS 6F .021 (SHEATH) ×1 IMPLANT
GUIDEWIRE INQWIRE 1.5J.035X260 (WIRE) IMPLANT
INQWIRE 1.5J .035X260CM (WIRE) ×2
KIT HEART LEFT (KITS) ×2 IMPLANT
PACK CARDIAC CATHETERIZATION (CUSTOM PROCEDURE TRAY) ×2 IMPLANT
TRANSDUCER W/STOPCOCK (MISCELLANEOUS) ×2 IMPLANT
TUBING CIL FLEX 10 FLL-RA (TUBING) ×2 IMPLANT

## 2016-06-11 NOTE — Telephone Encounter (Signed)
Contacted [employer] to to have "place of service" changed from "office" to [facility]" for patients Orencia infusion. She is covered from 06/08/16 through 06/08/17.   Spoke with patient to inform of the change and to clarify that the "Total Authorized" amount is the number of units of medication per infusion that is allowed. In her case it's 1300. Patient voiced understanding. She also states that she enrolled into an [place] assistance program that will also help with her expenses for the infusion.   Will scan document  [name] [name], [place], CPhT

## 2016-06-11 NOTE — Interval H&P Note (Signed)
Cath Lab Visit (complete for each Cath Lab visit)  Clinical Evaluation Leading to the Procedure:   ACS: No.  Non-ACS:    Anginal Classification: CCS III  Anti-ischemic medical therapy: Minimal Therapy (1 class of medications)  Non-Invasive Test Results: Intermediate-risk stress test findings: cardiac mortality 1-3%/year  Prior CABG: No previous CABG  Abnormal CT scan    History and Physical Interval Note:  06/11/2016 9:46 AM  [name] [name]  has presented today for surgery, with the diagnosis of calcification via CT, cp  The various methods of treatment have been discussed with the patient and family. After consideration of risks, benefits and other options for treatment, the patient has consented to  Procedure(s): Left Heart Cath and Coronary Angiography (N/A) as a surgical intervention .  The patient's history has been reviewed, patient examined, no change in status, stable for surgery.  I have reviewed the patient's chart and labs.  Questions were answered to the patient's satisfaction.     Lance Muss

## 2016-06-11 NOTE — Progress Notes (Signed)
Victorino Dike, RN called and made aware that IV team used ultrasound and obtained 2nd IV.

## 2016-06-11 NOTE — Progress Notes (Signed)
Pt difficult IV insertion, attempts made by 2 RNs, only able to insert 1 IV. Victorino Dike, RN aware, instructed to consult IV team, order placed.

## 2016-06-11 NOTE — H&P (View-Only) (Signed)
Cardiology Office Note    Date:  06/04/2016   ID:  [name] [name], DOB [birth year], MRN [id number]  PCP:  Birdena Jubilee, MD  Cardiologist: Dr.Turner  Chief Complaint: Cath discussion   History of Present Illness:   [name] [name] is a 51 y.o. female mild non obstructive CAD, HLD, chronic diastolic CHF, RA, depression and recent chest pain presents for cath discussion.   She had cardiac workup with nuclear stress test and echo were normal with diastolic dysfunction and elevated filling pressures on echo in 2014. A coronary CTA showed calcium score in the 95th% but no obstructive ASCAD (30% prox LAD stenosis).   She was evaluated again by Dr. Mayford Knife 08/22/14 with recurrent chest discomfort.  Her symptoms are quite severe in the office and she was transported to the hospital for further evaluation.  Cardiac enzymes remained normal. Cardiac catheterization demonstrated normal coronary arteries. Proton pump inhibitor was started.  Most recently 02/2016, she developed palpitations and recurrent CP leading to repeat assessment. Cardiac monitor showed some PACs but no other significant arrhythmias. She had a NST on 03/01/16 that showed a fixed small, mild apical perfusion defect may be due to attenuation given normal wall motion. There was also a small, mild reversible basal anterolateral perfusion defect. Ischemia could not be ruled out. Subsequently, Dr. Mayford Knife ordered for her to undergo a repeat coronary CTA with morphology. This was performed 05/04/16 and demonstrated a coronary calcium score of 171. All in proximal to mid LAD. This was 55 percentile for age and sex matched control. Normal coronary origin with right dominance. Diffuse long calcified plaque in proximal to mid LAD associated with 25-50% stenosis. Second diagonal branch has severe noncalcified ostial/proximal plaque with associated stenosis >70%. Minimal plaque in RCA and LCX arteries.  Mildly dilated pulmonary artery  measuring 31 mm suggestive of pulmonary hypertension.  Low dose Imdur added to chest pain due to CTA findings. However unable to tolerate. Seen in clinic by Robbie Lis 05/20/16 for ongoing chest pain and palpitations. BB and ASA added to regimen. Due to ongoing symptoms plan made for cath for definite evaluation of coronary anatomy.   She continues to feeling of pounding sensation and squeezing chest tightness multiple times in a day. This lasts for one to 2 minutes and resolved by itself. Associated shortness of breath. No dizziness, syncope, lower extremity edema, orthopnea, PND, melena or blood in her stool or urine. Compliant with her medication.   Past Medical History:  Diagnosis Date  . Anxiety   . Coronary artery calcification seen on CAT scan    minimal CAD with 30% prox and mild LAD  . Depression   . Diastolic dysfunction   . Dyslipidemia 01/30/2015  . Esophageal ring   . Fibromyalgia   . Hiatal hernia   . HSV-1 infection   . Morbid obesity (HCC)   . Rheumatoid arthritis(714.0)    M05.79  . Rheumatoid arthritis, seropositive, 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HCC)    Treated with Orencia, TB neg 09/12/2015    Past Surgical History:  Procedure Laterality Date  . CESAREAN SECTION    . ESOPHAGEAL MANOMETRY N/A 10/28/2014   Procedure: ESOPHAGEAL MANOMETRY (EM);  Surgeon: Hilarie Fredrickson, MD;  Location: [facility];  Service: Endoscopy;  Laterality: N/A;  . HERNIA REPAIR     reports surgery on 3 hernias, with 2 more present  . LAPAROSCOPIC GASTRIC SLEEVE RESECTION  11/21/12   [facility]  . LEFT HEART CATHETERIZATION WITH CORONARY ANGIOGRAM N/A 08/26/2014  Procedure: LEFT HEART CATHETERIZATION WITH CORONARY ANGIOGRAM;  Surgeon: Runell Gess, MD;  Location: [facility] CATH LAB;  Service: Cardiovascular;  Laterality: N/A;  . TUBAL LIGATION      Current Medications: Prior to Admission medications   Medication Sig Start Date End Date Taking? Authorizing Provider  Adapalene 0.3 % gel Apply 1  application topically daily as needed (acne).  12/31/14   Historical Provider, MD  ARIPiprazole (ABILIFY) 2 MG tablet Take 1 tablet (2 mg total) by mouth daily. 04/22/15   Benjaman Pott, MD  aspirin EC 81 MG tablet Take 81 mg by mouth daily.    Historical Provider, MD  atorvastatin (LIPITOR) 80 MG tablet Take 1 tablet (80 mg total) by mouth daily at 6 PM. 05/20/16   Brittainy M Sharol Harness, PA-C  Betamethasone Valerate 0.12 % foam Apply 1 application topically daily as needed. Itching 04/22/15   Historical Provider, MD  buPROPion (WELLBUTRIN XL) 300 MG 24 hr tablet Take 1 tablet (300 mg total) by mouth daily. 12/28/14   Adonis Brook, NP  CARAFATE 1 GM/10ML suspension TAKE 10 MLS (1 G TOTAL) BY MOUTH 4 (FOUR) TIMES DAILY - WITH MEALS AND AT BEDTIME. 11/18/14   Historical Provider, MD  clindamycin-benzoyl peroxide (BENZACLIN) gel Apply 1 application topically daily as needed. Acne 02/13/15   Historical Provider, MD  clobetasol cream (TEMOVATE) 0.05 % Apply 1 application topically daily as needed. Skin discoloration 04/22/15   Historical Provider, MD  cyclobenzaprine (FLEXERIL) 10 MG tablet TAKE 1 TABLET BY MOUTH EVERY 6-8 HOURS AS NEEDED FOR MUSCLE SPASMS 01/22/15   Historical Provider, MD  diclofenac sodium (VOLTAREN) 1 % GEL Apply 2 g topically 2 (two) times daily as needed (pain).  01/28/15   Historical Provider, MD  diltiazem (CARTIA XT) 180 MG 24 hr capsule Take 1 capsule (180 mg total) by mouth daily. 05/11/16   Quintella Reichert, MD  doxepin (SINEQUAN) 10 MG capsule Take 1 capsule (10 mg total) by mouth at bedtime. Patient taking differently: Take 10 mg by mouth at bedtime as needed (sleep).  12/28/14   Adonis Brook, NP  DULoxetine (CYMBALTA) 30 MG capsule Take 30 mg by mouth 2 (two) times daily.  12/30/14   Historical Provider, MD  EPIPEN 2-PAK 0.3 MG/0.3ML SOAJ injection Inject 0.3 mg into the skin once.  12/30/14   Historical Provider, MD  famciclovir (FAMVIR) 500 MG tablet TAKE 1 TAB BY MOUTH DAILY FOR  PREVENTION OR 3 TABS AT ONSET OF FEVER BLISTER X 1 DAY 01/21/15   Historical Provider, MD  FLUoxetine (PROZAC) 10 MG capsule Take 10 mg by mouth daily.    Historical Provider, MD  fluticasone (FLONASE) 50 MCG/ACT nasal spray Place 2 sprays into both nostrils as needed for allergies. 12/28/14 02/17/16  Adonis Brook, NP  gabapentin (NEURONTIN) 300 MG capsule Take 1 capsule (300 mg total) by mouth 3 (three) times daily. 04/22/15   Benjaman Pott, MD  HYDROcodone-acetaminophen (NORCO/VICODIN) 5-325 MG tablet Take 1-2 tablets by mouth every 6 (six) hours as needed for moderate pain. 09/03/15   Cathren Laine, MD  hydrOXYzine (VISTARIL) 25 MG capsule Take 25 mg by mouth every 8 (eight) hours as needed for anxiety.  12/30/14   Historical Provider, MD  isosorbide mononitrate (IMDUR) 30 MG 24 hr tablet Take 0.5 tablets (15 mg total) by mouth daily. 05/11/16 05/06/17  Quintella Reichert, MD  levothyroxine (SYNTHROID, LEVOTHROID) 25 MCG tablet Take 1 tablet (25 mcg total) by mouth daily before breakfast. 12/28/14   Velna Hatchet  Agustin, NP  lidocaine (LIDODERM) 5 % Place 1 patch onto the skin daily as needed (pain). Hip 03/29/15   Historical Provider, MD  metoprolol tartrate (LOPRESSOR) 25 MG tablet Take 0.5 tablets (12.5 mg total) by mouth 2 (two) times daily. 05/20/16   Brittainy Sherlynn Carbon, PA-C  neomycin-polymyxin-hydrocortisone (CORTISPORIN) 3.5-10000-1 otic suspension Place 1-2 drops into the left ear daily as needed (ear pain).  04/09/15   Historical Provider, MD  nitroGLYCERIN (NITROSTAT) 0.4 MG SL tablet Place 1 tablet (0.4 mg total) under the tongue every 5 (five) minutes as needed for chest pain. 05/20/16   Brittainy Sherlynn Carbon, PA-C  pantoprazole (PROTONIX) 40 MG tablet TAKE 1 TABLET (40 MG TOTAL) BY MOUTH 2 (TWO) TIMES DAILY. 02/03/16   Amy S Esterwood, PA-C  tapentadol (NUCYNTA) 50 MG TABS tablet Take 50 mg by mouth every 6 (six) hours as needed for moderate pain.     Historical Provider, MD  Topiramate ER 50 MG CP24  Take 50 mg by mouth daily as needed (migraines). Trokendi XR 12/28/14   Adonis Brook, NP  tretinoin (RETIN-A) 0.025 % cream Apply 1 application topically daily. 02/11/15   Historical Provider, MD  triamcinolone cream (KENALOG) 0.1 % Apply 1 application topically daily as needed. Itching 04/13/15   Historical Provider, MD  Tuberculin-Allergy Syringes 28G X 1/2" 1 ML MISC To use with injectable methotrexate once a week 04/28/16   Pollyann Savoy, MD  XERESE 5-1 % CREA Apply 1 application topically daily as needed (fever blisters).  12/30/14   Historical Provider, MD    Allergies:   Flu virus vaccine and Influenza vaccines   Social History   Social History  . Marital status: Married    Spouse name: [name]   . Number of children: 2  . Years of education: 16   Occupational History  . CUSTOMER SERVICE [employer]   Social History Main Topics  . Smoking status: Never Smoker  . Smokeless tobacco: Never Used  . Alcohol use No  . Drug use: No  . Sexual activity: Yes    Partners: Male    Birth control/ protection: None   Other Topics Concern  . Not on file   Social History Narrative   Marital Status: Married Probation officer)    Children:  Son [name]) Daughter [name])    Pets: None    Living Situation: Lives with husband and children    Occupation: Occupational psychologist Administrator)     Education: Oncologist (Psychology)     Tobacco Use/Exposure:  None    Alcohol Use:  Occasional   Drug Use:  None   Diet:  Regular   Exercise: Walking or Treadmill (2 x week)   Hobbies: Clinical cytogeneticist, [employer].                 Family History:  The patient's family history includes Cancer in her father; Depression in her mother; Diabetes in her maternal grandmother; Heart attack in her paternal grandmother; Hyperlipidemia in her mother; Hypertension in her maternal grandmother and paternal grandmother; Lung disease in her father; Sarcoidosis in her father.   ROS:     Please see the history of present illness.    ROS All other systems reviewed and are negative.   PHYSICAL EXAM:   VS:  BP 120/70   Pulse 82   Ht 5\' 2"  (1.575 m)   Wt 261 lb (118.4 kg)   LMP  (LMP Unknown)   SpO2 98%   BMI 47.74 kg/m  GEN: Well nourished, well developed, in no acute distress  HEENT: normal  Neck: no JVD, carotid bruits, or masses Cardiac: RRR; no murmurs, rubs, or gallops,no edema  Respiratory:  clear to auscultation bilaterally, normal work of breathing GI: soft, nontender, nondistended, + BS MS: no deformity or atrophy  Skin: warm and dry, no rash Neuro:  Alert and Oriented x 3, Strength and sensation are intact Psych: euthymic mood, full affect  Wt Readings from Last 3 Encounters:  06/04/16 261 lb (118.4 kg)  05/20/16 258 lb 6.4 oz (117.2 kg)  04/07/16 252 lb (114.3 kg)      Studies/Labs Reviewed:   EKG:  EKG is not ordered today.   Recent Labs: 04/07/2016: ALT 17; BUN 6; Creatinine, Ser 0.97; Hemoglobin 13.2; Platelets 291; Potassium 3.7; Sodium 142   Lipid Panel    Component Value Date/Time   CHOL 152 02/06/2015 1055   TRIG 82.0 02/06/2015 1055   HDL 55.50 02/06/2015 1055   CHOLHDL 3 02/06/2015 1055   VLDL 16.4 02/06/2015 1055   LDLCALC 80 02/06/2015 1055    Additional studies/ records that were reviewed today include:   Myoview 03/02/16  The left ventricular ejection fraction is normal (55-65%).  Nuclear stress EF: 63%.  There was no ST segment deviation noted during stress.  The study is normal.  This is a low risk study.   1. EF 63% with normal wall motion.  2. Fixed small, mild apical perfusion defect may be due to attenuation given normal wall motion.  There is also a small, mild reversible basal anterolateral perfusion defect.  Cannot rule out small area of ischemia.   Overall low risk study.   Monitor 03/02/16 Highlights    Normal sinus rhythm and sinus tachycardia with average heart rate 92bpm. The heart rate  ranged from 70-120bpm.  Rare PAC      Echocardiogram:05/10/13 LV EF: 50% -  55%  ------------------------------------------------------------ Indications:   Chest pain 786.51. Shortness of breath 786.05.  ------------------------------------------------------------ History:  PMH: Elevated troponin. Acquired from the patient and from the patient's chart. Chest pain. Abdominal pain. Back pain. Dyspnea. Risk factors: Morbidly obese.  ------------------------------------------------------------ Study Conclusions  - Left ventricle: The cavity size was normal. Wall thickness was normal. Systolic function was normal. The estimated ejection fraction was in the range of 50% to 55%. Doppler parameters are consistent with abnormal left ventricular relaxation (grade 1 diastolic dysfunction). Doppler parameters are consistent with high ventricular filling pressure. - Left atrium: The atrium was mildly dilated.  Cardiac Catheterization: 08/26/14 HEMODYNAMICS:    AO SYSTOLIC/AO DIASTOLIC: 141/89   LV SYSTOLIC/LV DIASTOLIC: 145/23  ANGIOGRAPHIC RESULTS:   1. Left main; normal  2. LAD; normal 3. Left circumflex; normal.  4. Right coronary artery; dominant and normal 5. Left ventriculography; RAO left ventriculogram was performed using  25 mL of Visipaque dye at 12 mL/second. The overall LVEF estimated  60 %  Without wall motion abnormalities  IMPRESSION:Mrs. Valenta has normal coronary arteries and normal LV function. Her chest pain was noncardiac. A femoral angiogram was performed with the intention to close her femoral puncture site with a "minx closure device" however I puncture the origin of the profunda therefore making that not possible. Patient will be gently hydrated, remain recumbent for 4 hours and then will be discharged home. She will follow-up with the mid-level provider in one to 2 weeks after that when necessary. She'll be treated with antireflux  measures.    ASSESSMENT & PLAN:    1. Atypical  Chest Pain/Coronary Calcification on CT - Plan for cath for definite evaluation of coronary anatomy. Continue ASA, statin and BB. Intolerant to Imdur.   The patient understands that risks include but are not limited to stroke (1 in 1000), death (1 in 1000), kidney failure [usually temporary] (1 in 500), bleeding (1 in 200), allergic reaction [possibly serious] (1 in 200), and agrees to proceed.   2. Palpitations - Cardiac monitor showed some PACs but no other significant arrhythmias. Continues to have sensation of "punding with chest tightness". Will increase metoprolol to 25mg  BID.   3. HTN - Stable and well controlled on current medications.  4. HL - Continue statin. Followed by PCP.    Medication Adjustments/Labs and Tests Ordered: Current medicines are reviewed at length with the patient today.  Concerns regarding medicines are outlined above.  Medication changes, Labs and Tests ordered today are listed in the Patient Instructions below. Patient Instructions  Medication Instructions:    Labwork: TODAY BMET, CBC, PT/INR  Testing/Procedures: Your physician has requested that you have a cardiac catheterization. Cardiac catheterization is used to diagnose and/or treat various heart conditions. Doctors may recommend this procedure for a number of different reasons. The most common reason is to evaluate chest pain. Chest pain can be a symptom of coronary artery disease (CAD), and cardiac catheterization can show whether plaque is narrowing or blocking your heart's arteries. This procedure is also used to evaluate the valves, as well as measure the blood flow and oxygen levels in different parts of your heart. For further information please visit [email or web address] Please follow instruction sheet, as given.   Follow-Up:   Any Other Special Instructions Will Be Listed Below (If Applicable).     If you need a refill on your  cardiac medications before your next appointment, please call your pharmacy.      Lorelei Pont, [place]  06/04/2016 4:04 PM    [facility] [address] [place], [place], [place]  [address] Phone: [phone]; Fax: [phone]

## 2016-06-11 NOTE — Discharge Instructions (Signed)

## 2016-07-01 ENCOUNTER — Encounter: Payer: Self-pay | Admitting: Cardiology

## 2016-07-01 ENCOUNTER — Ambulatory Visit (INDEPENDENT_AMBULATORY_CARE_PROVIDER_SITE_OTHER): Payer: BLUE CROSS/BLUE SHIELD | Admitting: Cardiology

## 2016-07-01 VITALS — BP 110/88 | HR 82 | Ht 62.0 in | Wt 258.1 lb

## 2016-07-01 DIAGNOSIS — R002 Palpitations: Secondary | ICD-10-CM | POA: Diagnosis not present

## 2016-07-01 NOTE — Progress Notes (Signed)
07/01/2016 [name] [name]   1965/07/26  [id number]  Primary Physician Birdena Jubilee, MD Primary Cardiologist: Dr. Mayford Knife    Reason for Visit/CC: F/u for Chest Pain s/p cath   HPI:  The patient is a 51 y.o.femalewith a history of diastolic dysfunction, RA, depression who presents back today for f/u for CAD and chest pain. She initially saw Dr. Mayford Knife several years ago for atypical CP and cardiac workup with nuclear stress test and echo were normal with diastolic dysfunction on echo. A coronary CTA showed calcium score in the 95th% but no obstructive ASCAD (30% prox LAD stenosis).  Most recently, she developed palpitations and recurrent CP leading to repeat assessment. Cardiac monitor showed some PACs but no other significant arrhythmias. She had a NST on 03/01/16 that showed a fixed small, mild apical perfusion defect may be due to attenuation given normal wall motion. There was also a small, mild reversible basal anterolateral perfusion defect. Ischemia could not be ruled out. Subsequently, Dr. Mayford Knife ordered for her to undergo a repeat coronary CTA with morphology. This was performed 05/04/16 and demonstrated a coronary calcium score of 171. All in proximal to mid LAD. This was 27 percentile for age and sex matched control. Normal coronary origin with right dominance. Diffuse long calcified plaque in proximal to mid LAD associated with 25-50% stenosis. Second diagonal branch has severe noncalcified ostial/proximal plaque with associated stenosis >70%. Minimal plaque in RCA and LCX arteries.  Mildly dilated pulmonary artery measuring 31 mm suggestive of pulmonary hypertension.  Based on her coronary CTA findings, medical therapy was recommended. Low dose Imdur, 15 mg, was added. Unfortunately, she was unable to tolerate Imdur due to severe headaches and diarrhea. Patient self discontinued Imdur and side effects resolved. She continued to note chest pain and palpitations. After discussion  with Dr. Mayford Knife, it was decided to proceed with definitive LHC given continued CP.   LHC was performed by Dr. Eldridge Dace on 06/11/16. This showed nonobstructive CAD with 10% mid LAD lesion. No aortic stenosis. LVEF was normal (55-65%) as well as normal LVEDP. Continued medical therapy was recommended.   She presents back to clinic for post cath f/u. She reports that she has done well. No recurrent CP. Her palpitations are less frequent. She denies any post cath complications. HR and BP are both well controlled.   Current Meds  Medication Sig  . Abatacept (ORENCIA IV) Inject 1,000 mg into the vein every 28 (twenty-eight) days.   . Adapalene 0.3 % gel Apply 1 application topically daily as needed (acne).   . ARIPiprazole (ABILIFY) 2 MG tablet Take 1 tablet (2 mg total) by mouth daily.  Marland Kitchen aspirin EC 81 MG tablet Take 81 mg by mouth daily.  Marland Kitchen atorvastatin (LIPITOR) 80 MG tablet Take 1 tablet (80 mg total) by mouth daily at 6 PM.  . Betamethasone Valerate 0.12 % foam Apply 1 application topically daily as needed. Itching  . buPROPion (WELLBUTRIN XL) 300 MG 24 hr tablet Take 1 tablet (300 mg total) by mouth daily.  . clindamycin-benzoyl peroxide (BENZACLIN) gel Apply 1 application topically daily as needed. Acne  . clobetasol cream (TEMOVATE) 0.05 % Apply 1 application topically daily as needed. Skin discoloration  . cyclobenzaprine (FLEXERIL) 10 MG tablet TAKE 1 TABLET BY MOUTH EVERY 6-8 HOURS AS NEEDED FOR MUSCLE SPASMS  . diclofenac sodium (VOLTAREN) 1 % GEL Apply 2 g topically 2 (two) times daily as needed (pain).   Marland Kitchen diltiazem (CARTIA XT) 180 MG 24 hr capsule Take  1 capsule (180 mg total) by mouth daily.  Marland Kitchen doxepin (SINEQUAN) 10 MG capsule Take 10 mg by mouth at bedtime as needed (sleep).  . EPIPEN 2-PAK 0.3 MG/0.3ML SOAJ injection Inject 0.3 mg into the skin as needed (allergic reaction).   . famciclovir (FAMVIR) 500 MG tablet Take 1500mg  on the onset of fever blister for 2 or 3 days as needed for  fever blisters  . FLUoxetine (PROZAC) 10 MG capsule Take 10 mg by mouth daily.  Marland Kitchen gabapentin (NEURONTIN) 300 MG capsule Take 1 capsule (300 mg total) by mouth 3 (three) times daily.  . hydrOXYzine (VISTARIL) 25 MG capsule Take 25 mg by mouth every 8 (eight) hours as needed for anxiety.   Marland Kitchen levothyroxine (SYNTHROID, LEVOTHROID) 25 MCG tablet Take 1 tablet (25 mcg total) by mouth daily before breakfast.  . lidocaine (LIDODERM) 5 % Place 1 patch onto the skin daily as needed (pain). Hip  . metoprolol tartrate (LOPRESSOR) 25 MG tablet Take 1 tablet (25 mg total) by mouth 2 (two) times daily.  . nitroGLYCERIN (NITROSTAT) 0.4 MG SL tablet Place 1 tablet (0.4 mg total) under the tongue every 5 (five) minutes as needed for chest pain.  . pantoprazole (PROTONIX) 40 MG tablet TAKE 1 TABLET (40 MG TOTAL) BY MOUTH 2 (TWO) TIMES DAILY.  Marland Kitchen PRESCRIPTION MEDICATION Inject 1 Dose as directed every 28 (twenty-eight) days.  Marland Kitchen tretinoin (RETIN-A) 0.025 % cream Apply 1 application topically daily.  Marland Kitchen triamcinolone cream (KENALOG) 0.1 % Apply 1 application topically daily as needed. Itching  . Tuberculin-Allergy Syringes 28G X 1/2" 1 ML MISC To use with injectable methotrexate once a week  . XERESE 5-1 % CREA Apply 1 application topically daily as needed (fever blisters).    Allergies  Allergen Reactions  . Influenza Vaccines Anaphylaxis  . Isosorbide     Severe headaches   Past Medical History:  Diagnosis Date  . Anxiety   . Coronary artery calcification seen on CAT scan    minimal CAD with 30% prox and mild LAD  . Depression   . Diastolic dysfunction   . Dyslipidemia 01/30/2015  . Esophageal ring   . Fibromyalgia   . Hiatal hernia   . HSV-1 infection   . Morbid obesity (HCC)   . Rheumatoid arthritis(714.0)    M05.79  . Rheumatoid arthritis, seropositive, 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HCC)    Treated with Orencia, TB neg 09/12/2015   Family History  Problem Relation Age of Onset  . Hyperlipidemia Mother   .  Depression Mother   . Sarcoidosis Father   . Lung disease Father     Pleural Mesothelioma  . Cancer Father   . Heart attack Paternal Grandmother   . Hypertension Paternal Grandmother   . Hypertension Maternal Grandmother   . Diabetes Maternal Grandmother   . Stroke Neg Hx   . Colon cancer Neg Hx   . Esophageal cancer Neg Hx   . Rectal cancer Neg Hx   . Stomach cancer Neg Hx    Past Surgical History:  Procedure Laterality Date  . CARDIAC CATHETERIZATION N/A 06/11/2016   Procedure: Left Heart Cath and Coronary Angiography;  Surgeon: Corky Crafts, MD;  Location: [facility] INVASIVE CV LAB;  Service: Cardiovascular;  Laterality: N/A;  . CESAREAN SECTION    . ESOPHAGEAL MANOMETRY N/A 10/28/2014   Procedure: ESOPHAGEAL MANOMETRY (EM);  Surgeon: Hilarie Fredrickson, MD;  Location: [facility] ENDOSCOPY;  Service: Endoscopy;  Laterality: N/A;  . HERNIA REPAIR     reports surgery  on 3 hernias, with 2 more present  . LAPAROSCOPIC GASTRIC SLEEVE RESECTION  11/21/12   [facility]  . LEFT HEART CATHETERIZATION WITH CORONARY ANGIOGRAM N/A 08/26/2014   Procedure: LEFT HEART CATHETERIZATION WITH CORONARY ANGIOGRAM;  Surgeon: Runell Gess, MD;  Location: [facility] CATH LAB;  Service: Cardiovascular;  Laterality: N/A;  . TUBAL LIGATION     Social History   Social History  . Marital status: Married    Spouse name: [name]   . Number of children: 2  . Years of education: 16   Occupational History  . CUSTOMER SERVICE [employer]   Social History Main Topics  . Smoking status: Never Smoker  . Smokeless tobacco: Never Used  . Alcohol use No  . Drug use: No  . Sexual activity: Yes    Partners: Male    Birth control/ protection: None   Other Topics Concern  . Not on file   Social History Narrative   Marital Status: Married Probation officer)    Children:  Son [name]) Daughter [name])    Pets: None    Living Situation: Lives with husband and children    Occupation: Occupational psychologist  Administrator)     Education: Oncologist (Psychology)     Tobacco Use/Exposure:  None    Alcohol Use:  Occasional   Drug Use:  None   Diet:  Regular   Exercise: Walking or Treadmill (2 x week)   Hobbies: Clinical cytogeneticist, [employer].                 Review of Systems: General: negative for chills, fever, night sweats or weight changes.  Cardiovascular: negative for chest pain, dyspnea on exertion, edema, orthopnea, palpitations, paroxysmal nocturnal dyspnea or shortness of breath Dermatological: negative for rash Respiratory: negative for cough or wheezing Urologic: negative for hematuria Abdominal: negative for nausea, vomiting, diarrhea, bright red blood per rectum, melena, or hematemesis Neurologic: negative for visual changes, syncope, or dizziness All other systems reviewed and are otherwise negative except as noted above.   Physical Exam:  Blood pressure 110/88, pulse 82, height 5\' 2"  (1.575 m), weight 258 lb 1.9 oz (117.1 kg).  General appearance: alert, cooperative, no distress and moderately obese Neck: no carotid bruit and no JVD Lungs: clear to auscultation bilaterally Heart: regular rate and rhythm, S1, S2 normal, no murmur, click, rub or gallop Extremities: extremities normal, atraumatic, no cyanosis or edema Pulses: 2+ and symmetric Skin: Skin color, texture, turgor normal. No rashes or lesions Neurologic: Grossly normal  EKG NSR  ASSESSMENT AND PLAN:   1. Nonobstructive CAD: LHC showed only 10% mid LAD lesion. No other disease. Continue medical therapy. Pt reassured that cath was ok.   2. Palpitations: Cardiac monitor showed  some PACs but no other significant arrhythmias. Metoprolol was increased last OV to 25 mg BID. Symptoms are controlled. EKG today shows NSR. HR 82 bpm.   3. HTN: BP is controlled on current regimen. We discussed heart healthy diet and increasing physical activity for weight loss.   4. HLD: on statin therapy. Followed by PCP.    PLAN  F/u with Dr. in 1 year.   Trammell Bowden PA-C 07/01/2016 1:28 PM

## 2016-07-01 NOTE — Patient Instructions (Addendum)
Medication Instructions:   Your physician recommends that you continue on your current medications as directed. Please refer to the Current Medication list given to you today.   If you need a refill on your cardiac medications before your next appointment, please call your pharmacy.  Labwork: NONE ORDERED  TODAY    Testing/Procedures: NONE ORDERED  TODAY    Follow-Up: Your physician wants you to follow-up in: ONE YEAR WITH  [facility]  You will receive a reminder letter in the mail two months in advance. If you don't receive a letter, please call our office to schedule the follow-up appointment.     Any Other Special Instructions Will Be Listed Below (If Applicable).                                                                                                                                                   

## 2016-07-02 ENCOUNTER — Other Ambulatory Visit (HOSPITAL_COMMUNITY): Payer: Self-pay | Admitting: *Deleted

## 2016-07-05 ENCOUNTER — Ambulatory Visit (HOSPITAL_COMMUNITY)
Admission: RE | Admit: 2016-07-05 | Discharge: 2016-07-05 | Disposition: A | Discharge status: Home / Self Care | Payer: BLUE CROSS/BLUE SHIELD | Source: Ambulatory Visit | Attending: Rheumatology | Admitting: Rheumatology

## 2016-07-05 DIAGNOSIS — M0579 Rheumatoid arthritis with rheumatoid factor of multiple sites without organ or systems involvement: Secondary | ICD-10-CM | POA: Diagnosis present

## 2016-07-05 LAB — CBC
HEMATOCRIT: 40.8 % (ref 36.0–46.0)
HEMOGLOBIN: 13 g/dL (ref 12.0–15.0)
MCH: 26.5 pg (ref 26.0–34.0)
MCHC: 31.9 g/dL (ref 30.0–36.0)
MCV: 83.1 fL (ref 78.0–100.0)
Platelets: 310 10*3/uL (ref 150–400)
RBC: 4.91 MIL/uL (ref 3.87–5.11)
RDW: 12.8 % (ref 11.5–15.5)
WBC: 8.2 10*3/uL (ref 4.0–10.5)

## 2016-07-05 LAB — COMPREHENSIVE METABOLIC PANEL
ALBUMIN: 3.4 g/dL — AB (ref 3.5–5.0)
ALK PHOS: 68 U/L (ref 38–126)
ALT: 14 U/L (ref 14–54)
AST: 22 U/L (ref 15–41)
Anion gap: 7 (ref 5–15)
BILIRUBIN TOTAL: 0.6 mg/dL (ref 0.3–1.2)
BUN: 8 mg/dL (ref 6–20)
CO2: 25 mmol/L (ref 22–32)
CREATININE: 0.92 mg/dL (ref 0.44–1.00)
Calcium: 8.9 mg/dL (ref 8.9–10.3)
Chloride: 108 mmol/L (ref 101–111)
GFR calc Af Amer: >60 mL/min (ref >60)
GLUCOSE: 91 mg/dL (ref 65–99)
POTASSIUM: 4 mmol/L (ref 3.5–5.1)
Sodium: 140 mmol/L (ref 135–145)
TOTAL PROTEIN: 6.9 g/dL (ref 6.5–8.1)

## 2016-07-05 MED ORDER — ACETAMINOPHEN 325 MG PO TABS
ORAL_TABLET | ORAL | Status: AC
  Filled 2016-07-05: qty 2

## 2016-07-05 MED ORDER — DIPHENHYDRAMINE HCL 25 MG PO CAPS
ORAL_CAPSULE | ORAL | Status: AC
  Filled 2016-07-05: qty 1

## 2016-07-05 MED ORDER — DIPHENHYDRAMINE HCL 25 MG PO CAPS
25.0000 mg | ORAL_CAPSULE | Freq: Once | ORAL | Status: AC
  Administered 2016-07-05: 25 mg via ORAL

## 2016-07-05 MED ORDER — ACETAMINOPHEN 325 MG PO TABS
650.0000 mg | ORAL_TABLET | Freq: Once | ORAL | Status: AC
  Administered 2016-07-05: 650 mg via ORAL

## 2016-07-05 MED ORDER — ABATACEPT 250 MG IV SOLR
1000.0000 mg | INTRAVENOUS | Status: DC
  Administered 2016-07-05: 1000 mg via INTRAVENOUS
  Filled 2016-07-05: qty 40

## 2016-07-05 NOTE — Progress Notes (Signed)
CBC, CMP normal

## 2016-07-06 ENCOUNTER — Telehealth: Payer: Self-pay | Admitting: Rheumatology

## 2016-07-06 NOTE — Telephone Encounter (Signed)
Called patient to schedule follow up appointment and she says she is "not feeling too good" and she will call back to schedule once she is feeling better.

## 2016-07-06 NOTE — Telephone Encounter (Signed)
-----   Message from Caffie Damme, RT sent at 07/06/2016  1:39 PM EST ----- Needs appointment.

## 2016-07-06 NOTE — Progress Notes (Signed)
Labs normal.

## 2016-07-08 ENCOUNTER — Telehealth: Payer: Self-pay

## 2016-07-08 NOTE — Telephone Encounter (Signed)
Received a fax from [employer] [place] regarding an approval for an APPEAL for her ORENCIA infusion on April 07, 2016 as a one time exception   Spoke with Ms. Pitter who voiced understanding. She said after her infusion earlier this week she felt like she got the flu. She said she was due to be seen by Mr. Leane Call and wanted to make an appointment. The call was transferred to [facility].   Reference [id number] Appeal ID: [id number] Phone [phone]  Will scan document into epic.  Jemimah Cressy, [place], CPhT   9:01 AM

## 2016-07-14 ENCOUNTER — Telehealth: Payer: Self-pay | Admitting: Pharmacist

## 2016-07-14 ENCOUNTER — Encounter: Payer: Self-pay | Admitting: Pharmacist

## 2016-07-14 NOTE — Telephone Encounter (Signed)
Patient's lidocaine patches were denied.  I called patient to inform her.  Left message asking her to call me back.  Will discuss other options with patient including OTC lidocaine patches or goodrx coupon for lidocaine patches (~$80 for 30 patches at [facility]).

## 2016-07-14 NOTE — Progress Notes (Signed)
Received PA request for Lidoderm patches.  PA submitted on Cover my Meds.  Will update patient once I know status.

## 2016-07-15 NOTE — Telephone Encounter (Signed)
I spoke to patient and informed her that her lidocaine patches were denied.  I discussed over the counter lidocaine patches with her.  Patient voiced understanding and denied any questions at this time.

## 2016-07-19 ENCOUNTER — Telehealth: Payer: Self-pay | Admitting: *Deleted

## 2016-07-19 NOTE — Telephone Encounter (Signed)
Prior Authorization for Voltaren Gel completed on Cover my meds.

## 2016-08-02 ENCOUNTER — Inpatient Hospital Stay (HOSPITAL_COMMUNITY): Admission: RE | Admit: 2016-08-02 | Payer: Self-pay | Source: Ambulatory Visit

## 2016-08-03 ENCOUNTER — Other Ambulatory Visit (HOSPITAL_COMMUNITY): Payer: Self-pay | Admitting: *Deleted

## 2016-08-03 DIAGNOSIS — M797 Fibromyalgia: Secondary | ICD-10-CM | POA: Insufficient documentation

## 2016-08-03 DIAGNOSIS — Z79899 Other long term (current) drug therapy: Secondary | ICD-10-CM | POA: Insufficient documentation

## 2016-08-03 NOTE — Progress Notes (Signed)
Office Visit Note  Patient: [name] [name]             Date of Birth: August 08, 1965           MRN: [id number]             PCP: Ival Bible, MD Referring: Jonathon Resides, MD Visit Date: 08/05/2016 Occupation: _0 @    Subjective:  Follow-up Rheumatoid arthritis and high-risk prescription  History of Present Illness: [name] [name] is a 51 y.o. female  Last seen 10/22/2015.  Patient is doing well with her Orencia IV every month except she states that she starts having some joint pain and stiffness a week before she is due for her infusion. She used to do better with her Orencia in the past.  Patient's fibromyalgia is also aggravated currently. She states that she is hurting all over.  Patient is working very hard with her other doctors to discontinue as many medications as possible. She does not feel like she wants to take unnecessary medications and she is coordinating the care with her other physicians.  I advised her that we may want to increase her Otrexup to 1 higher dose (to 22.60m weekly) to see if she will benefit from that since we cannot increase her Orencia at this time. She is agreeable.     Activities of Daily Living:  Patient reports morning stiffness for 30 minutes.   Patient Denies nocturnal pain.  Difficulty dressing/grooming: Reports Difficulty climbing stairs: Reports Difficulty getting out of chair: Reports Difficulty using hands for taps, buttons, cutlery, and/or writing: Reports   No Rheumatology ROS completed.   PMFS History:  Patient Active Problem List   Diagnosis Date Noted  . High risk medication use 08/03/2016  . Fibromyalgia syndrome 08/03/2016  . Abnormal cardiac CT angiography   . Sacrococcygeal pain 02/20/2016  . Rheumatoid arthritis involving 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[facility] 01/24/2016  . 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 Dyslipidemia 01/30/2015  . MDD (major depressive disorder), recurrent severe, without psychosis ([facility]  12/22/2014  . Essential hypertension 08/25/2014  . Morbid obesity ([facility] 08/25/2014  . CAD (coronary artery disease), native coronary artery 08/24/2014  . Numbness and tingling in left arm   . Arm numbness left 08/22/2014  . Chest pain 08/22/2014  . Gastroesophageal reflux disease without esophagitis 07/23/2014  . Coronary artery calcification seen on CAT scan 02/19/2014  . Hip pain 10/23/2013  . Solitary pulmonary nodule 08/28/2013  . Chest pain, atypical 07/18/2013  . Heart palpitations 06/27/2013  . Cough 06/04/2013  . Diastolic dysfunction 119/62/2297 . SOB (shortness of breath) 05/09/2013  . Other malaise and fatigue 01/21/2013  . Stress and adjustment reaction 12/09/2012  . Right sided abdominal pain 11/29/2012  . 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/21/2012  . Morbid obesity with BMI of 50.0-59.9, adult ([facility] 11/21/2012  . Arthritis 07/13/2012  . Depression 07/13/2012  . Routine health maintenance 05/07/2012    Past Medical History:  Diagnosis Date  . Anxiety   . Coronary artery calcification seen on CAT scan    minimal CAD with 30% prox and mild LAD  . Depression   . Diastolic dysfunction   . Dyslipidemia 01/30/2015  . Esophageal ring   . Fibromyalgia   . Hiatal hernia   . HSV-1 infection   . Morbid obesity ([facility]   . Rheumatoid arthritis(714.0)    M05.79  . Rheumatoid arthritis, seropositive, 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[facility]    Treated with Orencia, TB neg 09/12/2015    Family History  Problem Relation Age of Onset  . Hyperlipidemia Mother   . Depression Mother   . Sarcoidosis Father   . Lung disease Father     Pleural Mesothelioma  . Cancer Father   . Heart attack Paternal Grandmother   . Hypertension Paternal Grandmother   . Hypertension Maternal Grandmother   . Diabetes Maternal Grandmother   . Stroke Neg Hx   . Colon cancer Neg Hx   . Esophageal cancer Neg Hx   . Rectal cancer Neg Hx   . Stomach cancer Neg Hx    Past Surgical History:  Procedure  Laterality Date  . CARDIAC CATHETERIZATION N/A 06/11/2016   Procedure: Left Heart Cath and Coronary Angiography;  Surgeon: Jettie Booze, MD;  Location: [facility];  Service: Cardiovascular;  Laterality: N/A;  . CESAREAN SECTION    . ESOPHAGEAL MANOMETRY N/A 10/28/2014   Procedure: ESOPHAGEAL MANOMETRY (EM);  Surgeon: Irene Shipper, MD;  Location: [facility];  Service: Endoscopy;  Laterality: N/A;  . HERNIA REPAIR     reports surgery on 3 hernias, with 2 more present  . LAPAROSCOPIC GASTRIC SLEEVE RESECTION  11/21/12   [facility]  . LEFT HEART CATHETERIZATION WITH CORONARY ANGIOGRAM N/A 08/26/2014   Procedure: LEFT HEART CATHETERIZATION WITH CORONARY ANGIOGRAM;  Surgeon: Lorretta Harp, MD;  Location: [facility] C[facility];  Service: Cardiovascular;  Laterality: N/A;  . TUBAL LIGATION     Social History   Social History Narrative   Marital Status: Married Aeronautical engineer)    Children:  Son Marcello Moores) Daughter Margreta Journey)    Pets: None    Living Situation: Lives with husband and children    Occupation: Radiation protection practitioner Scientist, clinical (histocompatibility and immunogenetics))     Education: Dietitian (Psychology)     Tobacco Use/Exposure:  None    Alcohol Use:  Occasional   Drug Use:  None   Diet:  Regular   Exercise: Walking or Treadmill (2 x week)   Hobbies: Barrister's clerk, [employer]orations.                 Objective: Vital Signs: BP 122/78   Pulse 92   Resp 13   Ht _0  (1.575 m)   Wt 258 lb (117 kg)   BMI 47.19 kg/m    Physical Exam   Musculoskeletal Exam:  Full range of motion of all joints Grip strength is equal and strong bilaterally Fiber myalgia tender points are 18 out of 18 positive  CDAI Exam: No CDAI exam completed.    Investigation: Findings:  Labs from 08/11/2015 show elevated creatinine at 1.19 with a GFR normal at 60.  Repeat BMP on April 5th shows creatinine normal at 0.93 and GFR normal at 70.  CBC was normal in March and April.    I tried to review old x-rays and was  able to find an x-ray from 10/25/2011.  Looking at the lateral view of the right foot shows no "holes" in the talar bone.    RAPID 3 shows a raw score of 19.2 with an index of 6.3, which is consistent with high severity  Abnormal kidney function on March labs and then the labs all return back to normal in April  Last TB Gold normal-12/12/2015    Hospital Outpatient Visit on 07/05/2016  Component Date Value Ref Range Status  . WBC 07/05/2016 8.2  4.0 - 10.5 K/uL Final  . RBC 07/05/2016 4.91  3.87 - 5.11 MIL/uL Final  . Hemoglobin 07/05/2016 13.0  12.0 - 15.0 g/dL  Final  . HCT 07/05/2016 40.8  36.0 - 46.0 % Final  . MCV 07/05/2016 83.1  78.0 - 100.0 fL Final  . MCH 07/05/2016 26.5  26.0 - 34.0 pg Final  . MCHC 07/05/2016 31.9  30.0 - 36.0 g/dL Final  . RDW 07/05/2016 12.8  11.5 - 15.5 % Final  . Platelets 07/05/2016 310  150 - 400 K/uL Final  . Sodium 07/05/2016 140  135 - 145 mmol/L Final  . Potassium 07/05/2016 4.0  3.5 - 5.1 mmol/L Final  . Chloride 07/05/2016 108  101 - 111 mmol/L Final  . CO2 07/05/2016 25  22 - 32 mmol/L Final  . Glucose, Bld 07/05/2016 91  65 - 99 mg/dL Final  . BUN 07/05/2016 8  6 - 20 mg/dL Final  . Creatinine, Ser 07/05/2016 0.92  0.44 - 1.00 mg/dL Final  . Calcium 07/05/2016 8.9  8.9 - 10.3 mg/dL Final  . Total Protein 07/05/2016 6.9  6.5 - 8.1 g/dL Final  . Albumin 07/05/2016 3.4* 3.5 - 5.0 g/dL Final  . AST 07/05/2016 22  15 - 41 U/L Final  . ALT 07/05/2016 14  14 - 54 U/L Final  . Alkaline Phosphatase 07/05/2016 68  38 - 126 U/L Final  . Total Bilirubin 07/05/2016 0.6  0.3 - 1.2 mg/dL Final  . GFR calc non Af Amer 07/05/2016 >60  >60 mL/min Final  . GFR calc Af Amer 07/05/2016 >60  >60 mL/min Final   Comment: (NOTE) The eGFR has been calculated using the CKD EPI equation. This calculation has not been validated in all clinical situations. eGFR's persistently <60 mL/min signify possible Chronic Kidney Disease.   . Anion gap 07/05/2016 7  5 - 15  Final  Office Visit on 06/04/2016  Component Date Value Ref Range Status  . Sodium 06/04/2016 141  135 - 146 mmol/L Final  . Potassium 06/04/2016 4.0  3.5 - 5.3 mmol/L Final  . Chloride 06/04/2016 105  98 - 110 mmol/L Final  . CO2 06/04/2016 25  20 - 31 mmol/L Final  . Glucose, Bld 06/04/2016 82  65 - 99 mg/dL Final  . BUN 06/04/2016 11  7 - 25 mg/dL Final  . Creat 06/04/2016 0.79  0.50 - 1.10 mg/dL Final  . Calcium 06/04/2016 9.1  8.6 - 10.2 mg/dL Final  . WBC 06/04/2016 10.1  3.8 - 10.8 K/uL Final  . RBC 06/04/2016 5.16* 3.80 - 5.10 MIL/uL Final  . Hemoglobin 06/04/2016 13.7  11.7 - 15.5 g/dL Final  . HCT 06/04/2016 42.6  35.0 - 45.0 % Final  . MCV 06/04/2016 82.6  80.0 - 100.0 fL Final  . MCH 06/04/2016 26.6* 27.0 - 33.0 pg Final  . MCHC 06/04/2016 32.2  32.0 - 36.0 g/dL Final  . RDW 06/04/2016 13.0  11.0 - 15.0 % Final  . Platelets 06/04/2016 328  140 - 400 K/uL Final  . MPV 06/04/2016 9.3  7.5 - 12.5 fL Final  . Prothrombin Time 06/04/2016 10.5  9.0 - 11.5 sec Final   Comment:   For more information on this test, go to: http://education.questdiagnostics.com/faq/FAQ104     . INR 06/04/2016 1.0   Final   Comment:   Reference Range                        0.9-1.1 Moderate-intensity Warfarin Therapy    2.0-3.0 Higher-intensity Warfarin Therapy      3.0-4.0     Hospital Outpatient Visit on 04/07/2016  Component  Date Value Ref Range Status  . WBC 04/07/2016 8.3  4.0 - 10.5 K/uL Final  . RBC 04/07/2016 4.83  3.87 - 5.11 MIL/uL Final  . Hemoglobin 04/07/2016 13.2  12.0 - 15.0 g/dL Final  . HCT 04/07/2016 41.1  36.0 - 46.0 % Final  . MCV 04/07/2016 85.1  78.0 - 100.0 fL Final  . MCH 04/07/2016 27.3  26.0 - 34.0 pg Final  . MCHC 04/07/2016 32.1  30.0 - 36.0 g/dL Final  . RDW 04/07/2016 12.7  11.5 - 15.5 % Final  . Platelets 04/07/2016 291  150 - 400 K/uL Final  . Sodium 04/07/2016 142  135 - 145 mmol/L Final  . Potassium 04/07/2016 3.7  3.5 - 5.1 mmol/L Final  . Chloride  04/07/2016 110  101 - 111 mmol/L Final  . CO2 04/07/2016 28  22 - 32 mmol/L Final  . Glucose, Bld 04/07/2016 132* 65 - 99 mg/dL Final  . BUN 04/07/2016 6  6 - 20 mg/dL Final  . Creatinine, Ser 04/07/2016 0.97  0.44 - 1.00 mg/dL Final  . Calcium 04/07/2016 8.9  8.9 - 10.3 mg/dL Final  . Total Protein 04/07/2016 6.7  6.5 - 8.1 g/dL Final  . Albumin 04/07/2016 3.2* 3.5 - 5.0 g/dL Final  . AST 04/07/2016 27  15 - 41 U/L Final  . ALT 04/07/2016 17  14 - 54 U/L Final  . Alkaline Phosphatase 04/07/2016 68  38 - 126 U/L Final  . Total Bilirubin 04/07/2016 0.3  0.3 - 1.2 mg/dL Final  . GFR calc non Af Amer 04/07/2016 >60  >60 mL/min Final  . GFR calc Af Amer 04/07/2016 >60  >60 mL/min Final   Comment: (NOTE) The eGFR has been calculated using the CKD EPI equation. This calculation has not been validated in all clinical situations. eGFR's persistently <60 mL/min signify possible Chronic Kidney Disease.   . Anion gap 04/07/2016 4* 5 - 15 Final  Appointment on 03/01/2016  Component Date Value Ref Range Status  . Rest HR 03/02/2016 73  bpm Final  . Rest BP 03/02/2016 126/78  mmHg Final  . Peak HR 03/02/2016 98  bpm Final  . Peak BP 03/02/2016 132/62  mmHg Final  . SSS 03/02/2016 4   Final  . SRS 03/02/2016 5   Final  . SDS 03/02/2016 2   Final  . LHR 03/02/2016 0.33   Final  . TID 03/02/2016 1.11   Final  . LV sys vol 03/02/2016 30  mL Final  . LV dias vol 03/02/2016 81  46 - 106 mL Final  Hospital Outpatient Visit on 02/27/2016  Component Date Value Ref Range Status  . WBC 02/27/2016 7.9  4.0 - 10.5 K/uL Final  . RBC 02/27/2016 5.05  3.87 - 5.11 MIL/uL Final  . Hemoglobin 02/27/2016 13.7  12.0 - 15.0 g/dL Final  . HCT 02/27/2016 43.8  36.0 - 46.0 % Final  . MCV 02/27/2016 86.7  78.0 - 100.0 fL Final  . MCH 02/27/2016 27.1  26.0 - 34.0 pg Final  . MCHC 02/27/2016 31.3  30.0 - 36.0 g/dL Final  . RDW 02/27/2016 13.0  11.5 - 15.5 % Final  . Platelets 02/27/2016 323  150 - 400 K/uL Final    . Neutrophils Relative % 02/27/2016 46  % Final  . Neutro Abs 02/27/2016 3.6  1.7 - 7.7 K/uL Final  . Lymphocytes Relative 02/27/2016 46  % Final  . Lymphs Abs 02/27/2016 3.6  0.7 - 4.0 K/uL Final  .  Monocytes Relative 02/27/2016 6  % Final  . Monocytes Absolute 02/27/2016 0.5  0.1 - 1.0 K/uL Final  . Eosinophils Relative 02/27/2016 2  % Final  . Eosinophils Absolute 02/27/2016 0.2  0.0 - 0.7 K/uL Final  . Basophils Relative 02/27/2016 0  % Final  . Basophils Absolute 02/27/2016 0.0  0.0 - 0.1 K/uL Final  . Sodium 02/27/2016 142  135 - 145 mmol/L Final  . Potassium 02/27/2016 4.1  3.5 - 5.1 mmol/L Final  . Chloride 02/27/2016 110  101 - 111 mmol/L Final  . CO2 02/27/2016 27  22 - 32 mmol/L Final  . Glucose, Bld 02/27/2016 92  65 - 99 mg/dL Final  . BUN 02/27/2016 9  6 - 20 mg/dL Final  . Creatinine, Ser 02/27/2016 0.86  0.44 - 1.00 mg/dL Final  . Calcium 02/27/2016 8.9  8.9 - 10.3 mg/dL Final  . Total Protein 02/27/2016 6.7  6.5 - 8.1 g/dL Final  . Albumin 02/27/2016 3.3* 3.5 - 5.0 g/dL Final  . AST 02/27/2016 18  15 - 41 U/L Final  . ALT 02/27/2016 13* 14 - 54 U/L Final  . Alkaline Phosphatase 02/27/2016 69  38 - 126 U/L Final  . Total Bilirubin 02/27/2016 0.6  0.3 - 1.2 mg/dL Final  . GFR calc non Af Amer 02/27/2016 >60  >60 mL/min Final  . GFR calc Af Amer 02/27/2016 >60  >60 mL/min Final   Comment: (NOTE) The eGFR has been calculated using the CKD EPI equation. This calculation has not been validated in all clinical situations. eGFR's persistently <60 mL/min signify possible Chronic Kidney Disease.   . Anion gap 02/27/2016 5  5 - 15 Final      Imaging: No results found.  Speciality Comments: No specialty comments available.    Procedures:  No procedures performed Allergies: Influenza vaccines and Isosorbide   Assessment / Plan:     Visit Diagnoses: Rheumatoid arthritis involving multiple sites with positive rheumatoid factor (HCC) - -CCP  High risk medication  use - She has failed Humira and Enbrel in the past. Orencia-IV every monthOtrexup- 42m q week?Folic Acid two every day?  Fibromyalgia syndrome  Sacroiliac joint disease  Sacrococcygeal pain  Arthritis   Plan: #1: Rheumatoid arthritis. Doing well currently with her rheumatoid arthritis except flaring a week before her next infusion is due. Symptoms include stiffness and mild pain  #2: High risk prescription. Orencia every month. Her last infusion was yesterday (08/05/2016). Otrexup 20 mg weekly. We are increasing her dose 1 step higher to 22.5 mg. We will see if this is beneficial to the patient. I've asked the patient to keep a calendar to monitor her current pain level. If she is improving then we'll see the quit 22.5. If she is not improving then we will have to discuss other treatment options. Note that she has failed Humira and Enbrel in the past. Folic acid 2 mg daily Note that she gets her labs done at infusion time every other infusion.  #3: Fibromyalgia. Currently flaring. Has 18 out of 18 tender points.  #4: Patient would like a refill of Voltaren gel I'm giving her 10 tubes with 3 refills. Patient is aware how to use the medication properly  #5: I've asked the patient to return back in 3 months so we can verify that she is improving with increased dose of methotrexate. Note that they did not approve Otrexup and patient is using generic injectable methotrexate. She is not having any problems using the injectable  generic methotrexate at this time.  #3: Orders: No orders of the defined types were placed in this encounter.  Meds ordered this encounter  Medications  . methotrexate 50 MG/2ML injection    Sig: Inject 0.9 mLs (22.5 mg total) into the skin once a week.    Dispense:  10.8 mL    Refill:  0    Dispense 90 day supply with preservatives    Order Specific Question:   Supervising Provider    Answer:   Bo Merino [[id number]]  . folic acid (FOLVITE) 1 MG  tablet    Sig: Take 2 tablets (2 mg total) by mouth daily.    Dispense:  180 tablet    Refill:  4    Order Specific Question:   Supervising Provider    Answer:   Bo Merino [[id number]]  . diclofenac sodium (VOLTAREN) 1 % GEL    Sig: Apply 4 g topically 3 (three) times daily as needed. Voltaren Gel 3 grams to 3 large joints upto TID 3 TUBES with 3 refills    Dispense:  10 Tube    Refill:  3    Voltaren Gel 3 grams to 3 large joints upto TID 3 TUBES with 3 refills    Order Specific Question:   Supervising Provider    Answer:   Bo Merino [[id number]]  . Tuberculin-Allergy Syringes 27G X 1/2" 1 ML KIT    Sig: Inject 1 Syringe into the skin once a week. If syringe size greater than 60m, please call me at my office.    Dispense:  12 each    Refill:  4    Order Specific Question:   Supervising Provider    Answer:   DBo Merino[[id number]   Face-to-face time spent with patient was 30 minutes. 50% of time was spent in counseling and coordination of care.  Follow-Up Instructions: Return in about 3 months (around 11/05/2016).   NEliezer Lofts PA-C  Note - This record has been created using [employer]  Chart creation errors have been sought, but may not always  have been located. Such creation errors do not reflect on  the standard of medical care.

## 2016-08-04 ENCOUNTER — Encounter (HOSPITAL_COMMUNITY)
Admission: RE | Admit: 2016-08-04 | Discharge: 2016-08-04 | Disposition: A | Discharge status: Home / Self Care | Payer: BLUE CROSS/BLUE SHIELD | Source: Ambulatory Visit | Attending: Rheumatology | Admitting: Rheumatology

## 2016-08-04 DIAGNOSIS — M0579 Rheumatoid arthritis with rheumatoid factor of multiple sites without organ or systems involvement: Secondary | ICD-10-CM | POA: Insufficient documentation

## 2016-08-04 MED ORDER — ACETAMINOPHEN 325 MG PO TABS
650.0000 mg | ORAL_TABLET | Freq: Once | ORAL | Status: AC
  Administered 2016-08-04: 650 mg via ORAL

## 2016-08-04 MED ORDER — ACETAMINOPHEN 325 MG PO TABS
ORAL_TABLET | ORAL | Status: AC
  Filled 2016-08-04: qty 2

## 2016-08-04 MED ORDER — SODIUM CHLORIDE 0.9 % IV SOLN
1000.0000 mg | INTRAVENOUS | Status: DC
  Administered 2016-08-04: 1000 mg via INTRAVENOUS
  Filled 2016-08-04: qty 40

## 2016-08-04 MED ORDER — DIPHENHYDRAMINE HCL 25 MG PO TABS
25.0000 mg | ORAL_TABLET | Freq: Once | ORAL | Status: AC
  Administered 2016-08-04: 10:00:00 25 mg via ORAL
  Filled 2016-08-04: qty 1

## 2016-08-04 MED ORDER — DIPHENHYDRAMINE HCL 25 MG PO CAPS
ORAL_CAPSULE | ORAL | Status: AC
  Filled 2016-08-04: qty 1

## 2016-08-05 ENCOUNTER — Other Ambulatory Visit: Payer: Self-pay | Admitting: Radiology

## 2016-08-05 ENCOUNTER — Ambulatory Visit (INDEPENDENT_AMBULATORY_CARE_PROVIDER_SITE_OTHER): Payer: BLUE CROSS/BLUE SHIELD | Admitting: Rheumatology

## 2016-08-05 ENCOUNTER — Encounter: Payer: Self-pay | Admitting: Rheumatology

## 2016-08-05 VITALS — BP 122/78 | HR 92 | Resp 13 | Ht 62.0 in | Wt 258.0 lb

## 2016-08-05 DIAGNOSIS — M0579 Rheumatoid arthritis with rheumatoid factor of multiple sites without organ or systems involvement: Secondary | ICD-10-CM | POA: Diagnosis not present

## 2016-08-05 DIAGNOSIS — M1990 Unspecified osteoarthritis, unspecified site: Secondary | ICD-10-CM | POA: Diagnosis not present

## 2016-08-05 DIAGNOSIS — M533 Sacrococcygeal disorders, not elsewhere classified: Secondary | ICD-10-CM

## 2016-08-05 DIAGNOSIS — Z79899 Other long term (current) drug therapy: Secondary | ICD-10-CM | POA: Diagnosis not present

## 2016-08-05 DIAGNOSIS — M797 Fibromyalgia: Secondary | ICD-10-CM | POA: Diagnosis not present

## 2016-08-05 MED ORDER — METHOTREXATE SODIUM CHEMO INJECTION 50 MG/2ML: 22.5000 mg | INTRAMUSCULAR | 0 refills | Status: AC

## 2016-08-05 MED ORDER — FOLIC ACID 1 MG PO TABS
2.0000 mg | ORAL_TABLET | Freq: Every day | ORAL | 4 refills | Status: AC
Start: 2016-08-05 — End: 2017-10-29

## 2016-08-05 MED ORDER — DICLOFENAC SODIUM 1 % TD GEL: 3.0000 g | Freq: Three times a day (TID) | TRANSDERMAL | 3 refills | Status: DC | PRN

## 2016-08-05 MED ORDER — "TUBERCULIN-ALLERGY SYRINGES 27G X 1/2"" 1 ML KIT": 1.0000 | PACK | 4 refills | Status: DC

## 2016-08-05 NOTE — Progress Notes (Signed)
Rheumatology Medication Review by a Pharmacist Does the patient feel that his/her medications are working for him/her?  Yes Has the patient been experiencing any side effects to the medications prescribed?  No Does the patient have any problems obtaining medications?  No - Otrexup was not covered by her insurance, she is now using methotrexate vials and syringes.  Patient denies any difficulty using methotrexate vials and syringes.    Issues to address at subsequent visits: None   Pharmacist comments:  [name] [name] is a pleasant 51 yo F who presents for follow up of her rheumatoid arthritis.  She is currently receiving Orencia IV 1000 mg every 4 weeks.  She is also taking methotrexate 0.8 mL weekly and folic acid 2 mg daily.  Patient is currently getting her standing labs with her infusions.  Patient's most recent TB Gold was negative on 09/08/15.  She will be due for TB Gold again in April 2018.  Patient denies any questions or concerns regarding her medications at this time.     Lilla Shook, Pharm.D., BCPS, CPP Clinical Pharmacist Pager: [phone] Phone: [phone] 08/05/2016 3:31 PM

## 2016-08-10 ENCOUNTER — Telehealth: Payer: Self-pay

## 2016-08-10 ENCOUNTER — Encounter: Payer: Self-pay | Admitting: Rheumatology

## 2016-08-10 NOTE — Telephone Encounter (Signed)
Patient picked up letter on 08/10/16 at 1:30pm

## 2016-08-10 NOTE — Telephone Encounter (Signed)
Patient called requesting a letter from Mr Leane Call. She states that she is seeking emergency energy assistance because she has been on disability for three years and her husband has been out of work. She has received a disconnection notice and plans to go to the office on Thursday. She said she was told that it would be helpful if she sent a letter from the doctor stating her condition and why she needs to have heat. It this something you can do for her?   Thanks, [employer]

## 2016-08-10 NOTE — Telephone Encounter (Signed)
Spoke to patient to let her know that Mr. Leane Call wrote hr a letter and it was ready to be picked up at the front desk. Patient states that she will be over to pick it up soon.

## 2016-08-31 ENCOUNTER — Other Ambulatory Visit (HOSPITAL_COMMUNITY): Payer: Self-pay | Admitting: *Deleted

## 2016-09-01 ENCOUNTER — Encounter (HOSPITAL_COMMUNITY)
Admission: RE | Admit: 2016-09-01 | Discharge: 2016-09-01 | Disposition: A | Discharge status: Home / Self Care | Payer: BLUE CROSS/BLUE SHIELD | Source: Ambulatory Visit | Attending: Rheumatology | Admitting: Rheumatology

## 2016-09-01 DIAGNOSIS — M0579 Rheumatoid arthritis with rheumatoid factor of multiple sites without organ or systems involvement: Secondary | ICD-10-CM | POA: Insufficient documentation

## 2016-09-01 MED ORDER — SODIUM CHLORIDE 0.9 % IV SOLN
1000.0000 mg | INTRAVENOUS | Status: AC
  Administered 2016-09-01: 1000 mg via INTRAVENOUS
  Filled 2016-09-01: qty 40

## 2016-09-01 MED ORDER — DIPHENHYDRAMINE HCL 25 MG PO CAPS
ORAL_CAPSULE | ORAL | Status: AC
  Filled 2016-09-01: qty 1

## 2016-09-01 MED ORDER — DIPHENHYDRAMINE HCL 25 MG PO TABS
25.0000 mg | ORAL_TABLET | Freq: Once | ORAL | Status: AC
  Administered 2016-09-01: 25 mg via ORAL
  Filled 2016-09-01: qty 1

## 2016-09-01 MED ORDER — ACETAMINOPHEN 325 MG PO TABS
ORAL_TABLET | ORAL | Status: AC
  Administered 2016-09-01: 11:00:00 650 mg
  Filled 2016-09-01: qty 2

## 2016-09-01 MED ORDER — ACETAMINOPHEN 325 MG PO TABS: 650.0000 mg | ORAL_TABLET | Freq: Once | ORAL | Status: DC

## 2016-09-03 LAB — QUANTIFERON IN TUBE
QUANTIFERON MITOGEN VALUE: 6.16 [IU]/mL
QUANTIFERON NIL VALUE: 0.05 [IU]/mL
QUANTIFERON TB AG VALUE: 0.04 [IU]/mL
QUANTIFERON TB GOLD: NEGATIVE

## 2016-09-03 LAB — QUANTIFERON TB GOLD ASSAY (BLOOD)

## 2016-09-04 NOTE — Progress Notes (Signed)
TB: Negative

## 2016-09-05 ENCOUNTER — Other Ambulatory Visit (INDEPENDENT_AMBULATORY_CARE_PROVIDER_SITE_OTHER): Payer: Self-pay | Admitting: Specialist

## 2016-09-05 ENCOUNTER — Other Ambulatory Visit: Payer: Self-pay | Admitting: Cardiology

## 2016-09-07 NOTE — Telephone Encounter (Signed)
Cyclobenzaprine 10mg  Refill

## 2016-09-28 ENCOUNTER — Other Ambulatory Visit (HOSPITAL_COMMUNITY): Payer: Self-pay | Admitting: *Deleted

## 2016-09-28 ENCOUNTER — Telehealth: Payer: Self-pay | Admitting: Rheumatology

## 2016-09-28 NOTE — Telephone Encounter (Signed)
Patient scheduled to come in tomorrow for Orencia infusion. Please place order.

## 2016-09-29 ENCOUNTER — Other Ambulatory Visit: Payer: Self-pay | Admitting: Radiology

## 2016-09-29 ENCOUNTER — Encounter (HOSPITAL_COMMUNITY)
Admission: RE | Admit: 2016-09-29 | Discharge: 2016-09-29 | Disposition: A | Discharge status: Home / Self Care | Payer: BLUE CROSS/BLUE SHIELD | Source: Ambulatory Visit | Attending: Rheumatology | Admitting: Rheumatology

## 2016-09-29 DIAGNOSIS — E875 Hyperkalemia: Secondary | ICD-10-CM

## 2016-09-29 DIAGNOSIS — M0579 Rheumatoid arthritis with rheumatoid factor of multiple sites without organ or systems involvement: Secondary | ICD-10-CM

## 2016-09-29 LAB — COMPREHENSIVE METABOLIC PANEL
ALBUMIN: 3.5 g/dL (ref 3.5–5.0)
ALT: 20 U/L (ref 14–54)
ANION GAP: 6 (ref 5–15)
AST: 30 U/L (ref 15–41)
Alkaline Phosphatase: 66 U/L (ref 38–126)
BILIRUBIN TOTAL: 1.1 mg/dL (ref 0.3–1.2)
BUN: 7 mg/dL (ref 6–20)
CO2: 27 mmol/L (ref 22–32)
CREATININE: 0.86 mg/dL (ref 0.44–1.00)
Calcium: 8.9 mg/dL (ref 8.9–10.3)
Chloride: 106 mmol/L (ref 101–111)
GFR calc Af Amer: >60 mL/min (ref >60)
GFR calc non Af Amer: >60 mL/min (ref >60)
Glucose, Bld: 71 mg/dL (ref 65–99)
POTASSIUM: 5.5 mmol/L — AB (ref 3.5–5.1)
SODIUM: 139 mmol/L (ref 135–145)
Total Protein: 6.7 g/dL (ref 6.5–8.1)

## 2016-09-29 LAB — CBC WITH DIFFERENTIAL/PLATELET
BASOS PCT: 1 %
Basophils Absolute: 0.1 10*3/uL (ref 0.0–0.1)
EOS ABS: 0.1 10*3/uL (ref 0.0–0.7)
EOS PCT: 2 %
HCT: 42.3 % (ref 36.0–46.0)
Hemoglobin: 13.4 g/dL (ref 12.0–15.0)
Lymphocytes Relative: 45 %
Lymphs Abs: 4 10*3/uL (ref 0.7–4.0)
MCH: 26.7 pg (ref 26.0–34.0)
MCHC: 31.7 g/dL (ref 30.0–36.0)
MCV: 84.4 fL (ref 78.0–100.0)
MONO ABS: 0.5 10*3/uL (ref 0.1–1.0)
MONOS PCT: 6 %
Neutro Abs: 4 10*3/uL (ref 1.7–7.7)
Neutrophils Relative %: 46 %
PLATELETS: 267 10*3/uL (ref 150–400)
RBC: 5.01 MIL/uL (ref 3.87–5.11)
RDW: 13.8 % (ref 11.5–15.5)
WBC: 8.6 10*3/uL (ref 4.0–10.5)

## 2016-09-29 LAB — POTASSIUM: POTASSIUM: 3.8 mmol/L (ref 3.5–5.3)

## 2016-09-29 MED ORDER — ACETAMINOPHEN 325 MG PO TABS: 650.0000 mg | ORAL_TABLET | ORAL | Status: DC

## 2016-09-29 MED ORDER — SODIUM CHLORIDE 0.9 % IV SOLN
INTRAVENOUS | Status: DC
  Administered 2016-09-29: 11:00:00 via INTRAVENOUS

## 2016-09-29 MED ORDER — SODIUM CHLORIDE 0.9 % IV SOLN: 1000.0000 mg | INTRAVENOUS | Status: DC

## 2016-09-29 MED ORDER — SODIUM CHLORIDE 0.9 % IV SOLN
1000.0000 mg | INTRAVENOUS | Status: DC
  Administered 2016-09-29: 11:00:00 1000 mg via INTRAVENOUS
  Filled 2016-09-29: qty 40

## 2016-09-29 MED ORDER — DIPHENHYDRAMINE HCL 25 MG PO CAPS: 25.0000 mg | ORAL_CAPSULE | ORAL | Status: DC

## 2016-09-29 MED ORDER — DIPHENHYDRAMINE HCL 25 MG PO CAPS
ORAL_CAPSULE | ORAL | Status: AC
  Administered 2016-09-29: 11:00:00 25 mg
  Filled 2016-09-29: qty 1

## 2016-09-29 MED ORDER — ACETAMINOPHEN 325 MG PO TABS
ORAL_TABLET | ORAL | Status: AC
  Administered 2016-09-29: 11:00:00 650 mg
  Filled 2016-09-29: qty 2

## 2016-09-29 NOTE — Progress Notes (Signed)
K is high prob due to hemolysis. Pl have her repeat K

## 2016-09-29 NOTE — Progress Notes (Signed)
WNL

## 2016-09-29 NOTE — Progress Notes (Signed)
Orders updated for infusions Tb gold up to date  Orencia Tylenol Benadryl CBC CMP ordered

## 2016-09-29 NOTE — Progress Notes (Unsigned)
I called patient, her K+ was out of range elevated, she will come by here for a STAT K+

## 2016-09-30 NOTE — Telephone Encounter (Signed)
Called patient to advise normal labs repeat K+

## 2016-09-30 NOTE — Telephone Encounter (Signed)
-----   Message from Pollyann Savoy, MD sent at 09/29/2016 10:08 PM EDT ----- WNL

## 2016-10-05 ENCOUNTER — Other Ambulatory Visit: Payer: Self-pay | Admitting: Rheumatology

## 2016-10-05 NOTE — Telephone Encounter (Signed)
08/05/16 last visit  11/11/16 next visit Ok to refill per Dr Corliss Skains

## 2016-10-08 ENCOUNTER — Other Ambulatory Visit: Payer: Self-pay | Admitting: Radiology

## 2016-10-08 NOTE — Progress Notes (Signed)
Infusion orders are current for patient CBC CMP Tylenol Benadryl TB gold not due yet, appointments are up to date and follow up appointment t is scheduled 

## 2016-10-26 ENCOUNTER — Other Ambulatory Visit (HOSPITAL_COMMUNITY): Payer: Self-pay | Admitting: *Deleted

## 2016-10-27 ENCOUNTER — Encounter (HOSPITAL_COMMUNITY)
Admission: RE | Admit: 2016-10-27 | Discharge: 2016-10-27 | Disposition: A | Discharge status: Home / Self Care | Payer: BLUE CROSS/BLUE SHIELD | Source: Ambulatory Visit | Attending: Rheumatology | Admitting: Rheumatology

## 2016-10-27 DIAGNOSIS — M069 Rheumatoid arthritis, unspecified: Secondary | ICD-10-CM | POA: Insufficient documentation

## 2016-10-27 LAB — COMPREHENSIVE METABOLIC PANEL
ALBUMIN: 3.4 g/dL — AB (ref 3.5–5.0)
ALT: 12 U/L — ABNORMAL LOW (ref 14–54)
ANION GAP: 7 (ref 5–15)
AST: 19 U/L (ref 15–41)
Alkaline Phosphatase: 63 U/L (ref 38–126)
BILIRUBIN TOTAL: 0.5 mg/dL (ref 0.3–1.2)
BUN: 7 mg/dL (ref 6–20)
CO2: 24 mmol/L (ref 22–32)
Calcium: 8.9 mg/dL (ref 8.9–10.3)
Chloride: 107 mmol/L (ref 101–111)
Creatinine, Ser: 0.81 mg/dL (ref 0.44–1.00)
GFR calc Af Amer: >60 mL/min (ref >60)
GFR calc non Af Amer: >60 mL/min (ref >60)
GLUCOSE: 91 mg/dL (ref 65–99)
POTASSIUM: 4 mmol/L (ref 3.5–5.1)
SODIUM: 138 mmol/L (ref 135–145)
TOTAL PROTEIN: 6.6 g/dL (ref 6.5–8.1)

## 2016-10-27 LAB — CBC WITH DIFFERENTIAL/PLATELET
BASOS ABS: 0.1 10*3/uL (ref 0.0–0.1)
BASOS PCT: 1 %
EOS ABS: 0.2 10*3/uL (ref 0.0–0.7)
EOS PCT: 2 %
HCT: 41.7 % (ref 36.0–46.0)
Hemoglobin: 13.4 g/dL (ref 12.0–15.0)
Lymphocytes Relative: 44 %
Lymphs Abs: 3.6 10*3/uL (ref 0.7–4.0)
MCH: 27.1 pg (ref 26.0–34.0)
MCHC: 32.1 g/dL (ref 30.0–36.0)
MCV: 84.4 fL (ref 78.0–100.0)
MONO ABS: 0.5 10*3/uL (ref 0.1–1.0)
MONOS PCT: 7 %
NEUTROS ABS: 3.8 10*3/uL (ref 1.7–7.7)
NEUTROS PCT: 46 %
PLATELETS: 271 10*3/uL (ref 150–400)
RBC: 4.94 MIL/uL (ref 3.87–5.11)
RDW: 13.6 % (ref 11.5–15.5)
WBC: 8.2 10*3/uL (ref 4.0–10.5)

## 2016-10-27 MED ORDER — DIPHENHYDRAMINE HCL 25 MG PO CAPS
25.0000 mg | ORAL_CAPSULE | ORAL | Status: DC
  Administered 2016-10-27: 25 mg via ORAL

## 2016-10-27 MED ORDER — SODIUM CHLORIDE 0.9 % IV SOLN
1000.0000 mg | INTRAVENOUS | Status: DC
  Administered 2016-10-27: 11:00:00 1000 mg via INTRAVENOUS
  Filled 2016-10-27: qty 40

## 2016-10-27 MED ORDER — ACETAMINOPHEN 325 MG PO TABS
650.0000 mg | ORAL_TABLET | ORAL | Status: DC
  Administered 2016-10-27: 650 mg via ORAL

## 2016-10-27 MED ORDER — ACETAMINOPHEN 500 MG PO TABS
ORAL_TABLET | ORAL | Status: AC
  Filled 2016-10-27: qty 2

## 2016-10-27 MED ORDER — ACETAMINOPHEN 325 MG PO TABS
ORAL_TABLET | ORAL | Status: AC
  Filled 2016-10-27: qty 1

## 2016-10-27 MED ORDER — DIPHENHYDRAMINE HCL 25 MG PO CAPS
ORAL_CAPSULE | ORAL | Status: AC
  Filled 2016-10-27: qty 1

## 2016-10-27 MED ORDER — SODIUM CHLORIDE 0.9 % IV SOLN: INTRAVENOUS | Status: DC

## 2016-10-27 NOTE — Progress Notes (Signed)
wnl

## 2016-10-29 ENCOUNTER — Telehealth: Payer: Self-pay | Admitting: Pharmacist

## 2016-10-29 NOTE — Telephone Encounter (Signed)
Received a call from [place] at [facility] regarding site of care for infusions.  It was identified that patient's [employer] insurance was terminated on 10/18/16.    I called patient to discuss her insurance.  She reports her insurance was changed to [employer] on 10/06/16.  I again discussed with patient importance of letting us know anytime her insurance changes as her infusions have to be pre-certified.  Patient voiced understanding and provided me with her new insurance information.    ID: [id number] [id number]  Phone #: [phone]  Can you submit prior authorization for her future infusions and try to see if we can get 10/27/16 infusion approved?    Lilla Shook, Pharm.D., BCPS, CPP Clinical Pharmacist Pager: [phone] Phone: [phone] 10/29/2016 8:24 AM

## 2016-11-04 NOTE — Telephone Encounter (Signed)
Spoke with [facility] billing department to give them patient's new insurance information. Her profile was updated. We will fax a copy of the new authorization once we receive a response from insurance.   [name] [name], [place], CPhT 3:40 PM

## 2016-11-05 ENCOUNTER — Other Ambulatory Visit: Payer: Self-pay | Admitting: Cardiology

## 2016-11-09 ENCOUNTER — Other Ambulatory Visit: Payer: Self-pay | Admitting: Radiology

## 2016-11-09 NOTE — Progress Notes (Signed)
Infusion orders are current for patient [name] [name] Benadryl TB gold not due yet, appointments are up to date and follow up appointment  is scheduled CBC CMP not due this infusion will be done next infusion

## 2016-11-11 ENCOUNTER — Encounter: Payer: Self-pay | Admitting: Rheumatology

## 2016-11-11 ENCOUNTER — Ambulatory Visit (INDEPENDENT_AMBULATORY_CARE_PROVIDER_SITE_OTHER): Payer: BLUE CROSS/BLUE SHIELD | Admitting: Rheumatology

## 2016-11-11 VITALS — BP 134/80 | HR 80 | Resp 14 | Ht 62.0 in | Wt 254.0 lb

## 2016-11-11 DIAGNOSIS — M533 Sacrococcygeal disorders, not elsewhere classified: Secondary | ICD-10-CM

## 2016-11-11 DIAGNOSIS — Z79899 Other long term (current) drug therapy: Secondary | ICD-10-CM | POA: Diagnosis not present

## 2016-11-11 DIAGNOSIS — M0579 Rheumatoid arthritis with rheumatoid factor of multiple sites without organ or systems involvement: Secondary | ICD-10-CM

## 2016-11-11 DIAGNOSIS — M797 Fibromyalgia: Secondary | ICD-10-CM | POA: Diagnosis not present

## 2016-11-11 MED ORDER — LIDOCAINE 5 % EX PTCH: 1.0000 | MEDICATED_PATCH | Freq: Every day | CUTANEOUS | 3 refills | Status: DC | PRN

## 2016-11-11 MED ORDER — FOLIC ACID-VIT B6-VIT B12 2.2-25-1 MG PO TABS: 1.0000 | ORAL_TABLET | Freq: Every day | ORAL | 3 refills | Status: DC

## 2016-11-11 MED ORDER — DICLOFENAC SODIUM 1 % TD GEL: 4.0000 g | Freq: Four times a day (QID) | TRANSDERMAL | 3 refills | Status: DC

## 2016-11-11 NOTE — Progress Notes (Signed)
Office Visit Note  Patient: [name] [name]             Date of Birth: 07-Jan-1966           MRN: [id number]             PCP: [name], MD Referring: [name], MD Visit Date: 11/11/2016 Occupation: @GUAROCC @    Subjective:  Pain of the Right Foot and Pain of the Right Hip (Right SI joint )   History of Present Illness: [name] [name] is a 51 y.o. female  Last seen 08/05/2016.  Patient's rheumatoid arthritis was not well-controlled according to the patient because she was having pain a week before her next Orencia was due. As a result, we offered the patient to increase and her methotrexate and she was to start taking 22.5 mg every week and continue Orencia every month. Note: She has failed Humira and Enbrel in the past.  Her last labs were done May 2018 at within normal limits Her TB gold was also done in April May 2018 and it's negative  Pt c/o pain to right SI joint, right great toe, bilateral elbows, bilateral wrists, all of the fingers. Patient states that this started about 4 days ago. Patient states that there was no activities or injuries that could've triggered this at this time. Patient also states that she's been taking Orencia as scheduled and she is taking the Otrexup at 22.5 mg every week as scheduled.  Patient's fibromyalgia is flaring. Her son is graduating from high school in a few days. Patient has been working hard to give him a June 2018. Unfortunately, a few days after the graduation party he's continued to be leaving for the [employer]. She realizes that she's been under a lot of stress because of that eventual entrance into the [employer] base and the dangers that go along with it.    Activities of Daily Living:  Patient reports morning stiffness for 15 minutes.   Patient Reports nocturnal pain.  Difficulty dressing/grooming: Reports Difficulty climbing stairs: Reports Difficulty getting out of chair: Reports Difficulty using hands  for taps, buttons, cutlery, and/or writing: Reports   Review of Systems  Constitutional: Positive for fatigue.  HENT: Negative for mouth sores and mouth dryness.   Eyes: Negative for dryness.  Respiratory: Negative for shortness of breath.   Gastrointestinal: Negative for constipation and diarrhea.  Musculoskeletal: Positive for myalgias and myalgias.  Skin: Negative for sensitivity to sunlight.  Psychiatric/Behavioral: Positive for sleep disturbance. Negative for decreased concentration.    PMFS History:  Patient Active Problem List   Diagnosis Date Noted  . High risk medication use 08/03/2016  . Fibromyalgia syndrome 08/03/2016  . Abnormal cardiac CT angiography   . Sacrococcygeal pain 02/20/2016  . Rheumatoid arthritis involving 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HCC) 01/24/2016  . 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 Dyslipidemia 01/30/2015  . 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 Essential hypertension 08/25/2014  . Morbid obesity (HCC) 08/25/2014  . CAD (coronary artery disease), native coronary artery 08/24/2014  . Numbness and tingling in left arm   . Arm numbness left 08/22/2014  . Chest pain 08/22/2014  . Gastroesophageal reflux disease without esophagitis 07/23/2014  . Coronary artery calcification seen on CAT scan 02/19/2014  . Hip pain 10/23/2013  . Solitary pulmonary nodule 08/28/2013  . Chest pain, atypical 07/18/2013  . Heart palpitations 06/27/2013  . Cough 06/04/2013  . Diastolic dysfunction 05/18/2013  . SOB (shortness of  breath) 05/09/2013  . Other malaise and fatigue 01/21/2013  . Stress and adjustment reaction 12/09/2012  . Right sided abdominal pain 11/29/2012  . 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/21/2012  . Morbid obesity with BMI of 50.0-59.9, adult ([place]) 11/21/2012  . Arthritis 07/13/2012  . Depression 07/13/2012  . Routine health maintenance 05/07/2012    Past Medical History:  Diagnosis Date    . Anxiety   . Coronary artery calcification seen on CAT scan    minimal CAD with 30% prox and mild LAD  . Depression   . Diastolic dysfunction   . Dyslipidemia 01/30/2015  . Esophageal ring   . Fibromyalgia   . Hiatal hernia   . HSV-1 infection   . Morbid obesity ([place])   . Rheumatoid arthritis(714.0)    M05.79  . Rheumatoid arthritis, seropositive, 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[place])    Treated with Orencia, TB neg 09/12/2015    Family History  Problem Relation Age of Onset  . Hyperlipidemia Mother   . Depression Mother   . Sarcoidosis Father   . Lung disease Father        Pleural Mesothelioma  . Cancer Father   . Heart attack Paternal Grandmother   . Hypertension Paternal Grandmother   . Hypertension Maternal Grandmother   . Diabetes Maternal Grandmother   . Stroke Neg Hx   . Colon cancer Neg Hx   . Esophageal cancer Neg Hx   . Rectal cancer Neg Hx   . Stomach cancer Neg Hx    Past Surgical History:  Procedure Laterality Date  . CARDIAC CATHETERIZATION N/A 06/11/2016   Procedure: Left Heart Cath and Coronary Angiography;  Surgeon: Jettie Booze, MD;  Location: [facility];  Service: Cardiovascular;  Laterality: N/A;  . CESAREAN SECTION    . ESOPHAGEAL MANOMETRY N/A 10/28/2014   Procedure: ESOPHAGEAL MANOMETRY (EM);  Surgeon: Irene Shipper, MD;  Location: [facility];  Service: Endoscopy;  Laterality: N/A;  . HERNIA REPAIR     reports surgery on 3 hernias, with 2 more present  . LAPAROSCOPIC GASTRIC SLEEVE RESECTION  11/21/12   [facility]  . LEFT HEART CATHETERIZATION WITH CORONARY ANGIOGRAM N/A 08/26/2014   Procedure: LEFT HEART CATHETERIZATION WITH CORONARY ANGIOGRAM;  Surgeon: Lorretta Harp, MD;  Location: [facility] CATH LAB;  Service: Cardiovascular;  Laterality: N/A;  . TUBAL LIGATION     Social History   Social History Narrative   Marital Status: Married Aeronautical engineer)    Children:  Son Marcello Moores) Daughter Margreta Journey)    Pets: None    Living Situation: Lives with  husband and children    Occupation: Radiation protection practitioner Scientist, clinical (histocompatibility and immunogenetics))     Education: Dietitian (Psychology)     Tobacco Use/Exposure:  None    Alcohol Use:  Occasional   Drug Use:  None   Diet:  Regular   Exercise: Walking or Treadmill (2 x week)   Hobbies: Barrister's clerk, [employer] Decorations.                 Objective: Vital Signs: BP 134/80   Pulse 80   Resp 14   Ht '5\' 2"'$  (1.575 m)   Wt 254 lb (115.2 kg)   BMI 46.46 kg/m    Physical Exam  Constitutional: She is oriented to person, place, and time. She appears well-developed and well-nourished.  HENT:  Head: Normocephalic and atraumatic.  Eyes: EOM are normal. Pupils are equal, round, and reactive to light.  Cardiovascular: Normal rate, regular rhythm and normal heart sounds.  Exam reveals no gallop and no friction rub.   No murmur heard. Pulmonary/Chest: Effort normal and breath sounds normal. She has no wheezes. She has no rales.  Abdominal: Soft. Bowel sounds are normal. She exhibits no distension. There is no tenderness. There is no guarding. No hernia.  Musculoskeletal: Normal range of motion. She exhibits no edema, tenderness or deformity.  Lymphadenopathy:    She has no cervical adenopathy.  Neurological: She is alert and oriented to person, place, and time. Coordination normal.  Skin: Skin is warm and dry. Capillary refill takes less than 2 seconds. No rash noted.  Psychiatric: She has a normal mood and affect. Her behavior is normal.  Nursing note and vitals reviewed.    Musculoskeletal Exam:  Full range of motion of all joints Grip strength is equal and strong bilaterally Fibromyalgia tender points are 18 out of 18 positive  CDAI Exam: CDAI Homunculus Exam:   Joint Counts:  CDAI Tender Joint count: 0 CDAI Swollen Joint count: 0  No synovitis on examination. It appears that patient's rheumatoid arthritis is very well controlled with the current dual therapy of Orencia IV every month and  Otrexup 22.5 mg every week. (She is doing much better with this higher dose of Otrexup that she did a few months ago when she was on 20 mg of Otrexup).   Investigation: No additional findings. Hospital Outpatient Visit on 10/27/2016  Component Date Value Ref Range Status  . WBC 10/27/2016 8.2  4.0 - 10.5 K/uL Final  . RBC 10/27/2016 4.94  3.87 - 5.11 MIL/uL Final  . Hemoglobin 10/27/2016 13.4  12.0 - 15.0 g/dL Final  . HCT 84/21/0312 41.7  36.0 - 46.0 % Final  . MCV 10/27/2016 84.4  78.0 - 100.0 fL Final  . MCH 10/27/2016 27.1  26.0 - 34.0 pg Final  . MCHC 10/27/2016 32.1  30.0 - 36.0 g/dL Final  . RDW 81/18/8677 13.6  11.5 - 15.5 % Final  . Platelets 10/27/2016 271  150 - 400 K/uL Final  . Neutrophils Relative % 10/27/2016 46  % Final  . Neutro Abs 10/27/2016 3.8  1.7 - 7.7 K/uL Final  . Lymphocytes Relative 10/27/2016 44  % Final  . Lymphs Abs 10/27/2016 3.6  0.7 - 4.0 K/uL Final  . Monocytes Relative 10/27/2016 7  % Final  . Monocytes Absolute 10/27/2016 0.5  0.1 - 1.0 K/uL Final  . Eosinophils Relative 10/27/2016 2  % Final  . Eosinophils Absolute 10/27/2016 0.2  0.0 - 0.7 K/uL Final  . Basophils Relative 10/27/2016 1  % Final  . Basophils Absolute 10/27/2016 0.1  0.0 - 0.1 K/uL Final  . Sodium 10/27/2016 138  135 - 145 mmol/L Final  . Potassium 10/27/2016 4.0  3.5 - 5.1 mmol/L Final  . Chloride 10/27/2016 107  101 - 111 mmol/L Final  . CO2 10/27/2016 24  22 - 32 mmol/L Final  . Glucose, Bld 10/27/2016 91  65 - 99 mg/dL Final  . BUN 37/36/6815 7  6 - 20 mg/dL Final  . Creatinine, Ser 10/27/2016 0.81  0.44 - 1.00 mg/dL Final  . Calcium 94/70/7615 8.9  8.9 - 10.3 mg/dL Final  . Total Protein 10/27/2016 6.6  6.5 - 8.1 g/dL Final  . Albumin 18/34/3735 3.4* 3.5 - 5.0 g/dL Final  . AST 78/97/8478 19  15 - 41 U/L Final  . ALT 10/27/2016 12* 14 - 54 U/L Final  . Alkaline Phosphatase 10/27/2016 63  38 - 126 U/L Final  .  Total Bilirubin 10/27/2016 0.5  0.3 - 1.2 mg/dL Final  . GFR  calc non Af Amer 10/27/2016 >60  >60 mL/min Final  . GFR calc Af Amer 10/27/2016 >60  >60 mL/min Final   Comment: (NOTE) The eGFR has been calculated using the CKD EPI equation. This calculation has not been validated in all clinical situations. eGFR's persistently <60 mL/min signify possible Chronic Kidney Disease.   . Anion gap 10/27/2016 7  5 - 15 Final  Orders Only on 09/29/2016  Component Date Value Ref Range Status  . Potassium 09/29/2016 3.8  3.5 - 5.3 mmol/L Final  Orders Only on 09/29/2016  Component Date Value Ref Range Status  . WBC 09/29/2016 8.6  4.0 - 10.5 K/uL Final  . RBC 09/29/2016 5.01  3.87 - 5.11 MIL/uL Final  . Hemoglobin 09/29/2016 13.4  12.0 - 15.0 g/dL Final  . HCT 09/29/2016 42.3  36.0 - 46.0 % Final  . MCV 09/29/2016 84.4  78.0 - 100.0 fL Final  . MCH 09/29/2016 26.7  26.0 - 34.0 pg Final  . MCHC 09/29/2016 31.7  30.0 - 36.0 g/dL Final  . RDW 09/29/2016 13.8  11.5 - 15.5 % Final  . Platelets 09/29/2016 267  150 - 400 K/uL Final  . Neutrophils Relative % 09/29/2016 46  % Final  . Neutro Abs 09/29/2016 4.0  1.7 - 7.7 K/uL Final  . Lymphocytes Relative 09/29/2016 45  % Final  . Lymphs Abs 09/29/2016 4.0  0.7 - 4.0 K/uL Final  . Monocytes Relative 09/29/2016 6  % Final  . Monocytes Absolute 09/29/2016 0.5  0.1 - 1.0 K/uL Final  . Eosinophils Relative 09/29/2016 2  % Final  . Eosinophils Absolute 09/29/2016 0.1  0.0 - 0.7 K/uL Final  . Basophils Relative 09/29/2016 1  % Final  . Basophils Absolute 09/29/2016 0.1  0.0 - 0.1 K/uL Final  . Sodium 09/29/2016 139  135 - 145 mmol/L Final  . Potassium 09/29/2016 5.5* 3.5 - 5.1 mmol/L Final  . Chloride 09/29/2016 106  101 - 111 mmol/L Final  . CO2 09/29/2016 27  22 - 32 mmol/L Final  . Glucose, Bld 09/29/2016 71  65 - 99 mg/dL Final  . BUN 09/29/2016 7  6 - 20 mg/dL Final  . Creatinine, Ser 09/29/2016 0.86  0.44 - 1.00 mg/dL Final  . Calcium 09/29/2016 8.9  8.9 - 10.3 mg/dL Final  . Total Protein 09/29/2016 6.7   6.5 - 8.1 g/dL Final  . Albumin 09/29/2016 3.5  3.5 - 5.0 g/dL Final  . AST 09/29/2016 30  15 - 41 U/L Final  . ALT 09/29/2016 20  14 - 54 U/L Final  . Alkaline Phosphatase 09/29/2016 66  38 - 126 U/L Final  . Total Bilirubin 09/29/2016 1.1  0.3 - 1.2 mg/dL Final  . GFR calc non Af Amer 09/29/2016 >60  >60 mL/min Final  . GFR calc Af Amer 09/29/2016 >60  >60 mL/min Final   Comment: (NOTE) The eGFR has been calculated using the CKD EPI equation. This calculation has not been validated in all clinical situations. eGFR's persistently <60 mL/min signify possible Chronic Kidney Disease.   Georgiann Hahn gap 09/29/2016 6  5 - 15 Final  Hospital Outpatient Visit on 09/01/2016  Component Date Value Ref Range Status  . QUANTIFERON INCUBATION 09/01/2016 Comment   Final   Comment: (NOTE) Specimen incubated at [facility], [place], [place]. Performed At: [facility] [place], [place] [id number] Lindon Romp MD GM:[id number]   .  QUANTIFERON TB GOLD 09/01/2016 Negative  Negative Final  . QUANTIFERON CRITERIA 09/01/2016 Comment   Final   Comment: (NOTE) To be considered positive a specimen should have a TB Ag minus Nil value greater than or equal to 0.35 IU/mL and in addition the TB Ag minus Nil value must be greater than or equal to 25% of the Nil value. There may be insufficient information in these values to differentiate between some negative and some indeterminate test values.   . QUANTIFERON TB AG VALUE 09/01/2016 0.04  IU/mL Final  . Quantiferon Nil Value 09/01/2016 0.05  IU/mL Final  . QUANTIFERON MITOGEN VALUE 09/01/2016 6.16  IU/mL Final  . QFT TB AG MINUS NIL VALUE 09/01/2016 <0.00  IU/mL Final  . Interpretation: 09/01/2016 Comment   Final   Comment: (NOTE) The QuantiFERON TB Gold (in Tube) assay is intended for use as an aid in the diagnosis of TB infection. Negative results suggest that there is no TB infection. In patients with high suspicion of exposure, a  negative test should be repeated. A positive test indicates infection with Mycobacterium tuberculosis. Among individuals without tuberculosis infection, a positive test may be due to exposure to [place], M. szulgai or M. marinum. On the Internet, go to https://figueroa-lambert.info/ for further details. Performed At: [facility] [place], [place] [id number] Lindon Romp MD QA:[id number]      Imaging: No results found.  Speciality Comments: No specialty comments available.    Procedures:  No procedures performed Allergies: Influenza vaccines and Isosorbide   Assessment / Plan:     Visit Diagnoses: Rheumatoid arthritis involving multiple sites with positive rheumatoid factor (HCC)  High risk medication use  Fibromyalgia syndrome  Sacroiliac joint disease   Plan: #1: Rheumatoid arthritis. With positive rheumatoid factor and high-risk prescription Patient's RA is doing well. No joint pain, swelling, stiffness. No synovitis on examination. Current patient's pain is stemming from her fibromyalgia. She is getting adequate relief with the combination of Orencia IV once a month; Otrexup 22.5 mg once a week; folic acid 2 mg daily (we will change this to fat tab since patient's insurance prefers this)  #2: Fibromyalgia. 18 out of 18 tender points. Specifically she is having significant pain to the right greater trochanter bursa and bilateral trapezius muscles. See procedure notes for full details.  #3: Obesity. Patient will try to lose about 7 pounds by the next visit to our office. She we'll try to do water aerobics when possible.  #4: Fatigue and insomnia. Ongoing  #5: Stress stress/anxiety Patient's son is graduating from high school and going into the [employer].  #6: Return to clinic in 5 months  #7: Stop folic acid; new prescription for fat tab. (Patient's insurance company prefers fat tab). Refill Voltaren gel; 3 tubes with 3 refill Refill Lidoderm 5%; 30  patches with 3 refills  Orders: No orders of the defined types were placed in this encounter.  Meds ordered this encounter  Medications  . Folic Acid-Vit N9-GXQ J19 (FABB) 2.2-25-1 MG TABS    Sig: Take 1 tablet by mouth daily.    Dispense:  90 each    Refill:  3  . diclofenac sodium (VOLTAREN) 1 % GEL    Sig: Apply 4 g topically 4 (four) times daily.    Dispense:  3 Tube    Refill:  3  . lidocaine (LIDODERM) 5 %    Sig: Place 1 patch onto the skin daily as needed (pain). Hip  Dispense:  30 patch    Refill:  3    Face-to-face time spent with patient was 40 minutes. 50% of time was spent in counseling and coordination of care.  Follow-Up Instructions: Return in about 5 months (around 04/13/2017) for FMS, FATIGUE,INSOMNIA, RA,.   Eliezer Lofts, PA-C  Note - This record has been created using [employer].  Chart creation errors have been sought, but may not always  have been located. Such creation errors do not reflect on  the standard of medical care.

## 2016-11-25 ENCOUNTER — Other Ambulatory Visit (HOSPITAL_COMMUNITY): Payer: Self-pay | Admitting: *Deleted

## 2016-11-26 ENCOUNTER — Encounter (HOSPITAL_COMMUNITY)
Admission: RE | Admit: 2016-11-26 | Discharge: 2016-11-26 | Disposition: A | Discharge status: Home / Self Care | Payer: BLUE CROSS/BLUE SHIELD | Source: Ambulatory Visit | Attending: Rheumatology | Admitting: Rheumatology

## 2016-11-26 DIAGNOSIS — M0579 Rheumatoid arthritis with rheumatoid factor of multiple sites without organ or systems involvement: Secondary | ICD-10-CM | POA: Insufficient documentation

## 2016-11-26 MED ORDER — DIPHENHYDRAMINE HCL 25 MG PO CAPS
25.0000 mg | ORAL_CAPSULE | ORAL | Status: DC
  Administered 2016-11-26: 25 mg via ORAL

## 2016-11-26 MED ORDER — DIPHENHYDRAMINE HCL 25 MG PO CAPS
ORAL_CAPSULE | ORAL | Status: AC
  Filled 2016-11-26: qty 1

## 2016-11-26 MED ORDER — ACETAMINOPHEN 325 MG PO TABS
650.0000 mg | ORAL_TABLET | ORAL | Status: DC
  Administered 2016-11-26: 650 mg via ORAL

## 2016-11-26 MED ORDER — SODIUM CHLORIDE 0.9 % IV SOLN
INTRAVENOUS | Status: DC
  Administered 2016-11-26: 11:00:00 via INTRAVENOUS

## 2016-11-26 MED ORDER — SODIUM CHLORIDE 0.9 % IV SOLN
1000.0000 mg | INTRAVENOUS | Status: DC
  Administered 2016-11-26: 12:00:00 1000 mg via INTRAVENOUS
  Filled 2016-11-26: qty 40

## 2016-11-26 MED ORDER — ACETAMINOPHEN 325 MG PO TABS
ORAL_TABLET | ORAL | Status: AC
  Filled 2016-11-26: qty 2

## 2016-12-22 ENCOUNTER — Encounter (HOSPITAL_COMMUNITY)
Admission: RE | Admit: 2016-12-22 | Discharge: 2016-12-22 | Disposition: A | Discharge status: Home / Self Care | Payer: BLUE CROSS/BLUE SHIELD | Source: Ambulatory Visit | Attending: Rheumatology | Admitting: Rheumatology

## 2016-12-22 ENCOUNTER — Telehealth: Payer: Self-pay

## 2016-12-22 DIAGNOSIS — M0579 Rheumatoid arthritis with rheumatoid factor of multiple sites without organ or systems involvement: Secondary | ICD-10-CM | POA: Diagnosis present

## 2016-12-22 LAB — COMPREHENSIVE METABOLIC PANEL
ALBUMIN: 3 g/dL — AB (ref 3.5–5.0)
ALT: 12 U/L — ABNORMAL LOW (ref 14–54)
ANION GAP: 4 — AB (ref 5–15)
AST: 18 U/L (ref 15–41)
Alkaline Phosphatase: 61 U/L (ref 38–126)
BUN: 9 mg/dL (ref 6–20)
CHLORIDE: 108 mmol/L (ref 101–111)
CO2: 28 mmol/L (ref 22–32)
Calcium: 8.7 mg/dL — ABNORMAL LOW (ref 8.9–10.3)
Creatinine, Ser: 0.97 mg/dL (ref 0.44–1.00)
GFR calc Af Amer: >60 mL/min (ref >60)
GFR calc non Af Amer: >60 mL/min (ref >60)
GLUCOSE: 90 mg/dL (ref 65–99)
POTASSIUM: 4.1 mmol/L (ref 3.5–5.1)
SODIUM: 140 mmol/L (ref 135–145)
TOTAL PROTEIN: 6.1 g/dL — AB (ref 6.5–8.1)
Total Bilirubin: 0.7 mg/dL (ref 0.3–1.2)

## 2016-12-22 LAB — CBC
HCT: 40.4 % (ref 36.0–46.0)
Hemoglobin: 12.8 g/dL (ref 12.0–15.0)
MCH: 27.3 pg (ref 26.0–34.0)
MCHC: 31.7 g/dL (ref 30.0–36.0)
MCV: 86.1 fL (ref 78.0–100.0)
PLATELETS: 298 10*3/uL (ref 150–400)
RBC: 4.69 MIL/uL (ref 3.87–5.11)
RDW: 13 % (ref 11.5–15.5)
WBC: 7.8 10*3/uL (ref 4.0–10.5)

## 2016-12-22 LAB — DIFFERENTIAL
BASOS ABS: 0 10*3/uL (ref 0.0–0.1)
BASOS PCT: 1 %
EOS ABS: 0.1 10*3/uL (ref 0.0–0.7)
EOS PCT: 2 %
LYMPHS ABS: 3.3 10*3/uL (ref 0.7–4.0)
Lymphocytes Relative: 42 %
Monocytes Absolute: 0.6 10*3/uL (ref 0.1–1.0)
Monocytes Relative: 8 %
NEUTROS PCT: 47 %
Neutro Abs: 3.8 10*3/uL (ref 1.7–7.7)

## 2016-12-22 MED ORDER — ACETAMINOPHEN 325 MG PO TABS
650.0000 mg | ORAL_TABLET | ORAL | Status: AC
  Administered 2016-12-22: 11:00:00 650 mg via ORAL

## 2016-12-22 MED ORDER — ACETAMINOPHEN 325 MG PO TABS
ORAL_TABLET | ORAL | Status: AC
  Filled 2016-12-22: qty 2

## 2016-12-22 MED ORDER — SODIUM CHLORIDE 0.9 % IV SOLN
1000.0000 mg | INTRAVENOUS | Status: DC
  Administered 2016-12-22: 12:00:00 1000 mg via INTRAVENOUS
  Filled 2016-12-22: qty 40

## 2016-12-22 MED ORDER — DIPHENHYDRAMINE HCL 25 MG PO CAPS
25.0000 mg | ORAL_CAPSULE | ORAL | Status: AC
  Administered 2016-12-22: 25 mg via ORAL

## 2016-12-22 MED ORDER — SODIUM CHLORIDE 0.9 % IV SOLN
INTRAVENOUS | Status: DC
  Administered 2016-12-22: 12:00:00 via INTRAVENOUS

## 2016-12-22 NOTE — Telephone Encounter (Signed)
Patient advised of lab results and recommendations. Patient verbalized understanding.  

## 2016-12-22 NOTE — Telephone Encounter (Signed)
Patient would like a call back concerning labs.  Cb# is [phone].  Thank You. Please advise.

## 2016-12-22 NOTE — Progress Notes (Signed)
Low Ca , advise Ca supplement

## 2017-01-01 ENCOUNTER — Emergency Department (HOSPITAL_COMMUNITY): Payer: BLUE CROSS/BLUE SHIELD

## 2017-01-01 ENCOUNTER — Encounter (HOSPITAL_COMMUNITY): Payer: Self-pay | Admitting: Emergency Medicine

## 2017-01-01 ENCOUNTER — Emergency Department (HOSPITAL_COMMUNITY)
Admission: EM | Admit: 2017-01-01 | Discharge: 2017-01-02 | Disposition: A | Discharge status: Home / Self Care | Payer: BLUE CROSS/BLUE SHIELD | Attending: Emergency Medicine | Admitting: Emergency Medicine

## 2017-01-01 DIAGNOSIS — I639 Cerebral infarction, unspecified: Secondary | ICD-10-CM

## 2017-01-01 DIAGNOSIS — I10 Essential (primary) hypertension: Secondary | ICD-10-CM | POA: Diagnosis not present

## 2017-01-01 DIAGNOSIS — R4182 Altered mental status, unspecified: Secondary | ICD-10-CM

## 2017-01-01 DIAGNOSIS — R51 Headache: Secondary | ICD-10-CM | POA: Insufficient documentation

## 2017-01-01 DIAGNOSIS — I2510 Atherosclerotic heart disease of native coronary artery without angina pectoris: Secondary | ICD-10-CM | POA: Diagnosis not present

## 2017-01-01 DIAGNOSIS — Z7982 Long term (current) use of aspirin: Secondary | ICD-10-CM | POA: Diagnosis not present

## 2017-01-01 DIAGNOSIS — R531 Weakness: Secondary | ICD-10-CM

## 2017-01-01 DIAGNOSIS — R519 Headache, unspecified: Secondary | ICD-10-CM

## 2017-01-01 DIAGNOSIS — Z79899 Other long term (current) drug therapy: Secondary | ICD-10-CM | POA: Insufficient documentation

## 2017-01-01 DIAGNOSIS — R079 Chest pain, unspecified: Secondary | ICD-10-CM | POA: Diagnosis not present

## 2017-01-01 LAB — CBC
HEMATOCRIT: 41.1 % (ref 36.0–46.0)
HEMOGLOBIN: 13.2 g/dL (ref 12.0–15.0)
MCH: 27.2 pg (ref 26.0–34.0)
MCHC: 32.1 g/dL (ref 30.0–36.0)
MCV: 84.6 fL (ref 78.0–100.0)
Platelets: 257 10*3/uL (ref 150–400)
RBC: 4.86 MIL/uL (ref 3.87–5.11)
RDW: 12.9 % (ref 11.5–15.5)
WBC: 11.8 10*3/uL — ABNORMAL HIGH (ref 4.0–10.5)

## 2017-01-01 LAB — I-STAT TROPONIN, ED: TROPONIN I, POC: 0.03 ng/mL (ref 0.00–0.08)

## 2017-01-01 LAB — I-STAT BETA HCG BLOOD, ED (MC, WL, AP ONLY)

## 2017-01-01 NOTE — ED Triage Notes (Signed)
BIB EMS from home, pt had onset of palpitations 2130 that turned into chest pressure. Pt then reported tinglings in jaw and L arm. Also reported HA and blurred vision. Pt A/OX4 but very slow to respond. VSS, CBG 112.

## 2017-01-01 NOTE — ED Notes (Signed)
EDP at bedside  

## 2017-01-01 NOTE — Consult Note (Signed)
NEURO HOSPITALIST CONSULT NOTE   Requestig physician: Dr. Blinda Leatherwood  Reason for Consult: Right sided weakness  History obtained from:  Patient, Family and Chart     HPI:                                                                                                                                          ARLISA LECLERE is an 51 y.o. female who presented via EMS from home, initially complaining of palpitations at 2130 that then turned into chest pressure. She also reported tingling in her jaw and left arm, headache and blurred vision. Vitals were stable with CBG of 112. Code Stroke was called.   CT head was normal.   Neurology called to bedside with findings on exam suggestive of embellishment and felt most likely to be nonphysiological. Given the remaining possibility of acute infarction, a STAT MRI/MRA was indicated to rule out infarction and or occlusion. MRI brain was negative for acute infarction. MRA was negative for LVO.   Following MRI scans, the patient and her family endorsed significant recent psychosocial stressors.   Past Medical History:  Diagnosis Date  . Anxiety   . Coronary artery calcification seen on CAT scan    minimal CAD with 30% prox and mild LAD  . Depression   . Diastolic dysfunction   . Dyslipidemia 01/30/2015  . Esophageal ring   . Fibromyalgia   . Hiatal hernia   . HSV-1 infection   . Morbid obesity (HCC)   . Rheumatoid arthritis(714.0)    M05.79  . Rheumatoid arthritis, seropositive, 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HCC)    Treated with Orencia, TB neg 09/12/2015    Past Surgical History:  Procedure Laterality Date  . CARDIAC CATHETERIZATION N/A 06/11/2016   Procedure: Left Heart Cath and Coronary Angiography;  Surgeon: Corky Crafts, MD;  Location: [facility] INVASIVE CV LAB;  Service: Cardiovascular;  Laterality: N/A;  . CESAREAN SECTION    . ESOPHAGEAL MANOMETRY N/A 10/28/2014   Procedure: ESOPHAGEAL MANOMETRY (EM);  Surgeon: Hilarie Fredrickson,  MD;  Location: [facility] ENDOSCOPY;  Service: Endoscopy;  Laterality: N/A;  . HERNIA REPAIR     reports surgery on 3 hernias, with 2 more present  . LAPAROSCOPIC GASTRIC SLEEVE RESECTION  11/21/12   [facility]  . LEFT HEART CATHETERIZATION WITH CORONARY ANGIOGRAM N/A 08/26/2014   Procedure: LEFT HEART CATHETERIZATION WITH CORONARY ANGIOGRAM;  Surgeon: Runell Gess, MD;  Location: [facility] CATH LAB;  Service: Cardiovascular;  Laterality: N/A;  . TUBAL LIGATION      Family History  Problem Relation Age of Onset  . Hyperlipidemia Mother   . Depression Mother   . Sarcoidosis Father   . Lung disease Father        Pleural Mesothelioma  . Cancer Father   .  Heart attack Paternal Grandmother   . Hypertension Paternal Grandmother   . Hypertension Maternal Grandmother   . Diabetes Maternal Grandmother   . Stroke Neg Hx   . Colon cancer Neg Hx   . Esophageal cancer Neg Hx   . Rectal cancer Neg Hx   . Stomach cancer Neg Hx    Social History:  reports that she has never smoked. She has never used smokeless tobacco. She reports that she does not drink alcohol or use drugs.  Allergies  Allergen Reactions  . Influenza Vaccines Anaphylaxis  . Isosorbide     Severe headaches    MEDICATIONS:                                                                                                                     Home medication list reviewed.   ROS:                                                                                                                                       Left sided sensory numbness and right sided weakness. Other ROS as per HPI.  Blood pressure 125/81, pulse 80, temperature 98.1 F (36.7 C), temperature source Oral, resp. rate (!) 21, height 5\' 2"  (1.575 m), weight 114.3 kg (252 lb), SpO2 94 %.  General Examination:                                                                                                      HEENT-  [place]/AT  Lungs- Respirations unlabored Extremities- No  edema  Neurological Examination Mental Status: Awake. Sedated affect appears embellished and is distractable. Fully oriented with slow, laborious answers to questions. Exhibits a pattern of dysarthria that has characteristics most consistent with nonphysiological etiology/embellishment. Able to follow all commands. Naming intact.  Cranial Nerves: II: Vsual fields intact. PERRL III,IV, VI: Ptosis not present, EOMI without nystagmus V,VII: Draws up/contracts right perioral muscles intermittently with variable intensity - this finding is distractable.  VIII: hearing intact  to voice IX,X: No hypophonia XI: Variable response XII: midline tongue extension Motor: RUE Approximately 4/5 with variable strength RLE: Approximately 4-/5 with variable strength LUE 5/5 LLE 5/5 Sensory: Subjectively increased temperature sensation to LUE and LLE.  Deep Tendon Reflexes: 2+ and symmetric throughout Plantars: Right: downgoing  Left: downgoing Cerebellar: No ataxia with FNF on left Gait: Deferred   Lab Results: Basic Metabolic Panel: No results for input(s): NA, K, CL, CO2, GLUCOSE, BUN, CREATININE, CALCIUM, MG, PHOS in the last 168 hours.  Liver Function Tests: No results for input(s): AST, ALT, ALKPHOS, BILITOT, PROT, ALBUMIN in the last 168 hours. No results for input(s): LIPASE, AMYLASE in the last 168 hours. No results for input(s): AMMONIA in the last 168 hours.  CBC: No results for input(s): WBC, NEUTROABS, HGB, HCT, MCV, PLT in the last 168 hours.  Cardiac Enzymes: No results for input(s): CKTOTAL, CKMB, CKMBINDEX, TROPONINI in the last 168 hours.  Lipid Panel: No results for input(s): CHOL, TRIG, HDL, CHOLHDL, VLDL, LDLCALC in the last 168 hours.  CBG: No results for input(s): GLUCAP in the last 168 hours.  Microbiology: Results for orders placed or performed during the hospital encounter of 08/22/14  MRSA PCR Screening     Status: None   Collection Time: 08/22/14 11:59 PM   Result Value Ref Range Status   MRSA by PCR NEGATIVE NEGATIVE Final    Comment:        The GeneXpert MRSA Assay (FDA approved for NASAL specimens only), is one component of a comprehensive MRSA colonization surveillance program. It is not intended to diagnose MRSA infection nor to guide or monitor treatment for MRSA infections.     Coagulation Studies: No results for input(s): LABPROT, INR in the last 72 hours.  Imaging: No results found.  Assessment: 51 year old female with acute onset of right sided weakness 1. Exam reveals multiple features which are distractable, variable and in some cases (mouth drawn up on right) clearly nonphysiological. Overall clinical impression is that the findings are either embellished or due to conversion disorder. 2. MRI brain negative for acute infarction. Her brain shows no atrophy or chronic white matter ischemic changes. MRI was performed while symptoms and exam findings documented above were still present.  3. MRA brain negative for LVO. Vessels do not appear to show evidence for intracranial atherosclerosis  Recommendations: 1. Discussed my impression with patient and her family. After this discussion, the patient and her family endorsed a history of 2 current and ongoing significant psychosocial stressors.  2. Reassured patient and her family.  3. At conclusion of my assessment, patient and family were considering possible discharge home.    Electronically signed: Dr. Caryl Pina 01/01/2017, 11:40 PM

## 2017-01-02 ENCOUNTER — Emergency Department (HOSPITAL_COMMUNITY): Payer: BLUE CROSS/BLUE SHIELD

## 2017-01-02 LAB — BASIC METABOLIC PANEL
ANION GAP: 7 (ref 5–15)
BUN: 12 mg/dL (ref 6–20)
CHLORIDE: 110 mmol/L (ref 101–111)
CO2: 22 mmol/L (ref 22–32)
Calcium: 8.8 mg/dL — ABNORMAL LOW (ref 8.9–10.3)
Creatinine, Ser: 0.77 mg/dL (ref 0.44–1.00)
GFR calc Af Amer: >60 mL/min (ref >60)
GFR calc non Af Amer: >60 mL/min (ref >60)
Glucose, Bld: 101 mg/dL — ABNORMAL HIGH (ref 65–99)
Potassium: 3.5 mmol/L (ref 3.5–5.1)
SODIUM: 139 mmol/L (ref 135–145)

## 2017-01-02 LAB — CBG MONITORING, ED: GLUCOSE-CAPILLARY: 93 mg/dL (ref 65–99)

## 2017-01-02 MED ORDER — HYDROCODONE-ACETAMINOPHEN 5-325 MG PO TABS
1.0000 | ORAL_TABLET | Freq: Once | ORAL | Status: AC
  Administered 2017-01-02: 1 via ORAL
  Filled 2017-01-02: qty 1

## 2017-01-02 MED ORDER — LORAZEPAM 2 MG/ML IJ SOLN
INTRAMUSCULAR | Status: AC
  Administered 2017-01-02: 2 mg
  Filled 2017-01-02: qty 1

## 2017-01-02 NOTE — ED Provider Notes (Signed)
[facility]-EMERGENCY DEPT Provider Note   CSN: [id number] Arrival date & time: 01/01/17  2257     History   Chief Complaint Chief Complaint  Patient presents with  . Code Stroke    HPI [name] [name] is a 51 y.o. female.  The history is provided by the patient and medical records. No language interpreter was used.   [name] [name] is a 51 y.o. female  with a PMH of CHF, CAD, RA, fibromyalgia who presents to the Emergency Department for evaluation. Per daughter at bedside, patient became very slow to respond while at dinner around 10pm tonight. Daughter states mental status now is different from baseline. Patient states that her heart starting beating out of her chest for a few minutes then she developed chest pressure, headache. Associated with jaw and left arm numbness. Denies weakness. Chest pressure feels as if someone is sitting on chest. No meds prior to arrival. Hx of CAD with recent cath in January 2018 showing mild LAD 10% stenosed. No shortness of breath. No syncopal episodes. Denies weakness.   Past Medical History:  Diagnosis Date  . Anxiety   . Coronary artery calcification seen on CAT scan    minimal CAD with 30% prox and mild LAD  . Depression   . Diastolic dysfunction   . Dyslipidemia 01/30/2015  . Esophageal ring   . Fibromyalgia   . Hiatal hernia   . HSV-1 infection   . Morbid obesity (HCC)   . Rheumatoid arthritis(714.0)    M05.79  . Rheumatoid arthritis, seropositive, 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HCC)    Treated with Orencia, TB neg 09/12/2015    Patient Active Problem List   Diagnosis Date Noted  . High risk medication use 08/03/2016  . Fibromyalgia syndrome 08/03/2016  . Abnormal cardiac CT angiography   . Sacrococcygeal pain 02/20/2016  . Rheumatoid arthritis involving 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HCC) 01/24/2016  . 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 Dyslipidemia 01/30/2015  . MDD (major depressive disorder), recurrent severe, 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    . Essential hypertension 08/25/2014  . Morbid obesity (HCC) 08/25/2014  . CAD (coronary artery disease), native coronary artery 08/24/2014  . Numbness and tingling in left arm   . Arm numbness left 08/22/2014  . Chest pain 08/22/2014  . Gastroesophageal reflux disease without esophagitis 07/23/2014  . Coronary artery calcification seen on CAT scan 02/19/2014  . Hip pain 10/23/2013  . Solitary pulmonary nodule 08/28/2013  . Chest pain, atypical 07/18/2013  . Heart palpitations 06/27/2013  . Cough 06/04/2013  . Diastolic dysfunction 05/18/2013  . SOB (shortness of breath) 05/09/2013  . Other malaise and fatigue 01/21/2013  . Stress and adjustment reaction 12/09/2012  . Right sided abdominal pain 11/29/2012  . 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/21/2012  . Morbid obesity with BMI of 50.0-59.9, adult (HCC) 11/21/2012  . Arthritis 07/13/2012  . Depression 07/13/2012  . Routine health maintenance 05/07/2012    Past Surgical History:  Procedure Laterality Date  . CARDIAC CATHETERIZATION N/A 06/11/2016   Procedure: Left Heart Cath and Coronary Angiography;  Surgeon: Corky Crafts, MD;  Location: [facility] INVASIVE CV LAB;  Service: Cardiovascular;  Laterality: N/A;  . CESAREAN SECTION    . ESOPHAGEAL MANOMETRY N/A 10/28/2014   Procedure: ESOPHAGEAL MANOMETRY (EM);  Surgeon: Hilarie Fredrickson, MD;  Location: [facility] ENDOSCOPY;  Service: Endoscopy;  Laterality: N/A;  . HERNIA REPAIR     reports surgery on 3 hernias, with 2 more present  . LAPAROSCOPIC GASTRIC SLEEVE RESECTION  11/21/12  [facility]  . LEFT HEART CATHETERIZATION WITH CORONARY ANGIOGRAM N/A 08/26/2014   Procedure: LEFT HEART CATHETERIZATION WITH CORONARY ANGIOGRAM;  Surgeon: Runell Gess, MD;  Location: [facility] CATH LAB;  Service: Cardiovascular;  Laterality: N/A;  . TUBAL LIGATION      OB History    No data available       Home Medications    Prior to Admission medications   Medication Sig Start Date End Date  Taking? Authorizing Provider  Abatacept (ORENCIA IV) Inject 1,000 mg into the vein every 28 (twenty-eight) days.     [provider]  Adapalene 0.3 % gel Apply 1 application topically daily as needed (acne).  12/31/14   [provider]  ARIPiprazole (ABILIFY) 2 MG tablet Take 1 tablet (2 mg total) by mouth daily. 04/22/15   Benjaman Pott, MD  aspirin EC 81 MG tablet Take 81 mg by mouth daily.    [provider]  atorvastatin (LIPITOR) 80 MG tablet Take 1 tablet (80 mg total) by mouth daily at 6 PM. 05/20/16   Sharol Harness, Roxy Horseman, PA-C  Betamethasone Valerate 0.12 % foam Apply 1 application topically daily as needed. Itching 04/22/15   [provider]  buPROPion (WELLBUTRIN XL) 300 MG 24 hr tablet Take 1 tablet (300 mg total) by mouth daily. 12/28/14   Adonis Brook, NP  clindamycin-benzoyl peroxide (BENZACLIN) gel Apply 1 application topically daily as needed. Acne 02/13/15   [provider]  clobetasol cream (TEMOVATE) 0.05 % Apply 1 application topically daily as needed. Skin discoloration 04/22/15   [provider]  cyclobenzaprine (FLEXERIL) 10 MG tablet TAKE 1 TABLET BY MOUTH EVERY 6 TO 8 HOURS AS NEEDED MUSCLE SPASMS 09/07/16   Kerrin Champagne, MD  diclofenac sodium (VOLTAREN) 1 % GEL Apply 4 g topically 4 (four) times daily. 11/11/16   Panwala, Naitik, PA-C  diltiazem (CARTIA XT) 180 MG 24 hr capsule Take 1 capsule (180 mg total) by mouth daily. 05/11/16   Quintella Reichert, MD  doxepin (SINEQUAN) 10 MG capsule Take 10 mg by mouth at bedtime as needed (sleep).    [provider]  EPIPEN 2-PAK 0.3 MG/0.3ML SOAJ injection Inject 0.3 mg into the skin as needed (allergic reaction).  12/30/14   [provider]  famciclovir (FAMVIR) 500 MG tablet Take 1500mg  on the onset of fever blister for 2 or 3 days as needed for fever blisters 01/21/15   [provider]  FLUoxetine (PROZAC) 10 MG capsule Take 10 mg by mouth daily.     [provider]  fluticasone (FLONASE) 50 MCG/ACT nasal spray Place 2 sprays into both nostrils as needed for allergies. 12/28/14 06/09/16  08/07/16, NP  folic acid (FOLVITE) 1 MG tablet Take 2 tablets (2 mg total) by mouth daily. 08/05/16 10/29/17  10/31/17, PA-C  Folic Acid-Vit B6-Vit B12 (FABB) 2.2-25-1 MG TABS Take 1 tablet by mouth daily. 11/11/16   Panwala, Naitik, PA-C  gabapentin (NEURONTIN) 300 MG capsule Take 1 capsule (300 mg total) by mouth 3 (three) times daily. 04/22/15   04/24/15, MD  hydrOXYzine (VISTARIL) 25 MG capsule Take 25 mg by mouth every 8 (eight) hours as needed for anxiety.  12/30/14   [provider]  levothyroxine (SYNTHROID, LEVOTHROID) 25 MCG tablet Take 1 tablet (25 mcg total) by mouth daily before breakfast. 12/28/14   12/30/14, NP  lidocaine (LIDODERM) 5 % Place 1 patch onto the skin daily as needed (pain). Hip 11/11/16  Panwala, Naitik, PA-C  metoprolol tartrate (LOPRESSOR) 25 MG tablet Take 1 tablet (25 mg total) by mouth 2 (two) times daily. Patient not taking: Reported on 08/05/2016 06/04/16 09/02/16  Manson Passey, PA  nitroGLYCERIN (NITROSTAT) 0.4 MG SL tablet PLACE 1 TABLET UNDER THE TONGUE EVERY 5 MINUTES AS NEEDED FOR CHEST PAIN. 09/06/16   Quintella Reichert, MD  pantoprazole (PROTONIX) 40 MG tablet TAKE 1 TABLET (40 MG TOTAL) BY MOUTH 2 (TWO) TIMES DAILY. 02/03/16   Esterwood, Amy S, PA-C  PRESCRIPTION MEDICATION Inject 1 Dose as directed every 28 (twenty-eight) days.    [provider]  rosuvastatin (CRESTOR) 20 MG tablet Take 20 mg by mouth. 09/21/16 09/21/17  [provider]  tretinoin (RETIN-A) 0.025 % cream Apply 1 application topically daily. 02/11/15   [provider]  triamcinolone cream (KENALOG) 0.1 % Apply 1 application topically daily as needed. Itching 04/13/15   [provider]  Tuberculin-Allergy Syringes 28G X 1/2" 1 ML MISC To use with injectable methotrexate once a week  04/28/16   Pollyann Savoy, MD  XERESE 5-1 % CREA Apply 1 application topically daily as needed (fever blisters).  12/30/14   [provider]    Family History Family History  Problem Relation Age of Onset  . Hyperlipidemia Mother   . Depression Mother   . Sarcoidosis Father   . Lung disease Father        Pleural Mesothelioma  . Cancer Father   . Heart attack Paternal Grandmother   . Hypertension Paternal Grandmother   . Hypertension Maternal Grandmother   . Diabetes Maternal Grandmother   . Stroke Neg Hx   . Colon cancer Neg Hx   . Esophageal cancer Neg Hx   . Rectal cancer Neg Hx   . Stomach cancer Neg Hx     Social History Social History  Substance Use Topics  . Smoking status: Never Smoker  . Smokeless tobacco: Never Used  . Alcohol use No     Allergies   Influenza vaccines and Isosorbide   Review of Systems Review of Systems  Respiratory: Negative for shortness of breath.   Cardiovascular: Positive for chest pain and palpitations. Negative for leg swelling.  Neurological: Positive for numbness and headaches.  All other systems reviewed and are negative.    Physical Exam Updated Vital Signs BP 125/73   Pulse 84   Temp 98.1 F (36.7 C) (Oral)   Resp 18   Ht 5\' 2"  (1.575 m)   Wt 114.3 kg (252 lb)   SpO2 97%   BMI 46.09 kg/m   Physical Exam  Constitutional: She is oriented to person, place, and time. She appears well-developed and well-nourished. No distress.  HENT:  Head: Normocephalic and atraumatic.  Cardiovascular: Normal rate, regular rhythm and normal heart sounds.   No murmur heard. Pulmonary/Chest: Effort normal and breath sounds normal. No respiratory distress.  Abdominal: Soft. She exhibits no distension. There is no tenderness.  Musculoskeletal: She exhibits no edema.  Neurological: She is alert and oriented to person, place, and time.  Alert and oriented x 4. Speech is clear and answers questions appropriately, however she is  very slow to respond and often repeating phrases. Able to follow commands.  Cranial Nerves:  II:  Peripheral visual fields grossly normal, pupils equal, round, reactive to light III, IV, VI: EOM intact bilaterally, ptosis not present V,VII: smile symmetric, eyes kept closed tightly against resistance VIII: hearing grossly normal IX, X: symmetric soft palate movement, uvula elevates symmetrically  XI: bilateral shoulder shrug symmetric and strong XII: midline tongue extension 5/5 muscle strength in left upper and lower extremities 4/5 muscle strength on right however patient notes this is baseline 2/2 RA. Strong and equal grip strength. Sensory to light touch normal in all four extremities.  Normal finger-to-nose and rapid alternating movements; no drift.  Skin: Skin is warm and dry.  Nursing note and vitals reviewed.    ED Treatments / Results  Labs (all labs ordered are listed, but only abnormal results are displayed) Labs Reviewed  BASIC METABOLIC PANEL - Abnormal; Notable for the following:       Result Value   Glucose, Bld 101 (*)    Calcium 8.8 (*)    All other components within normal limits  CBC - Abnormal; Notable for the following:    WBC 11.8 (*)    All other components within normal limits  CBG MONITORING, ED  I-STAT TROPONIN, ED  I-STAT BETA HCG BLOOD, ED ([facility], [facility], [facility] ONLY)    EKG  EKG Interpretation None       Radiology Dg Chest Portable 1 View  Result Date: 01/01/2017 CLINICAL DATA:  Mental status change. EXAM: PORTABLE CHEST 1 VIEW COMPARISON:  02/17/2016 FINDINGS: Cardiomediastinal silhouette is normal. Mediastinal contours appear intact. There is no evidence of focal airspace consolidation, pleural effusion or pneumothorax. Osseous structures are without acute abnormality. Soft tissues are grossly normal. IMPRESSION: No active disease. Electronically Signed   By: Ted Mcalpine M.D.   On: 01/01/2017 23:40   Ct Head Code Stroke Wo Contrast`  Result  Date: 01/02/2017 CLINICAL DATA:  Code stroke. 51 y/o F; tingling in jaw and left arm with associated headache and blurred vision. EXAM: CT HEAD WITHOUT CONTRAST TECHNIQUE: Contiguous axial images were obtained from the base of the skull through the vertex without intravenous contrast. COMPARISON:  08/24/2014 CT head FINDINGS: Brain: No evidence of acute infarction, hemorrhage, hydrocephalus, extra-axial collection or mass lesion/mass effect. Partially empty sella turcica without interval change. Vascular: No hyperdense vessel or unexpected calcification. Skull: Normal. Negative for fracture or focal lesion. Sinuses/Orbits: No acute finding. Other: None. ASPECTS [facility] Stroke Program Early CT Score) - Ganglionic level infarction (caudate, lentiform nuclei, internal capsule, insula, M1-M3 cortex): 7 - Supraganglionic infarction (M4-M6 cortex): 3 Total score (0-10 with 10 being normal): 10 IMPRESSION: 1. No acute intracranial abnormality. 2. ASPECTS is 10 3. Stable unremarkable CT of head for age. These results were called by telephone at the time of interpretation on 01/01/2017 at 11:59 pm to PA [facility] , who verbally acknowledged these results. Electronically Signed   By: Mitzi Hansen M.D.   On: 01/02/2017 00:00    Procedures Procedures (including critical care time)  Medications Ordered in ED Medications  LORazepam (ATIVAN) 2 MG/ML injection (not administered)     Initial Impression / Assessment and Plan / ED Course  I have reviewed the triage vital signs and the nursing notes.  Pertinent labs & imaging results that were available during my care of the patient were reviewed by me and considered in my medical decision making (see chart for details).    [name] [name] is a 51 y.o. female who presents to ED for being slow to respond while at dinner tonight around 10pm associated with chest pain and left arm numbness. No focal neuro deficits on exam. Code stroke initiated. See  neuro note for full consultation recommendations. EKG reassuring. Trop negative. CT head negative.  MRI negative - code stroke canceled. Low risk  heart score. PERC negative. Evaluation does not show pathology that would require ongoing emergent intervention or inpatient treatment. Will have patient follow up with her cardiologist - understands to call Monday am. Discussed reasons to return with patient and family at bedside. All questions answered.   Patient discussed with Dr. Blinda Leatherwood who agrees with treatment plan.    Final Clinical Impressions(s) / ED Diagnoses   Final diagnoses:  Bad headache  Chest pain with low risk for cardiac etiology    New Prescriptions New Prescriptions   No medications on file     Laryah Neuser, [place] Picket, PA-C 01/02/17 0116    Gilda Crease, MD 01/09/17 [phone]

## 2017-01-02 NOTE — Discharge Instructions (Signed)
It was my pleasure taking care of you today!   Fortunately your labs and imaging today were very reassuring, however I would still like you to call your cardiologist on Monday morning to schedule a follow up appointment.   Return to ER for new/worsening symptoms, any additional concerns.

## 2017-01-02 NOTE — ED Notes (Signed)
Pt transported to MRI with this RN, RR and Neuro

## 2017-01-02 NOTE — ED Notes (Signed)
Per Dr. Otelia Limes cancel Code Stroke

## 2017-01-02 NOTE — Progress Notes (Signed)
Mrs. [name] [name] is a 51 yo female presenting to ED via [facility] with acute onset of Left side chest pressure, tingling to Left arm and jaw, associated with Right side weakness. LKW 2130, CBG 93, NIH 6. Code stroke cancelled per Dr. Otelia Limes, pt to be evaluated by EDP/PA, they will determine if pt is admitted or discharged per EDP/PA.

## 2017-01-11 ENCOUNTER — Other Ambulatory Visit: Payer: Self-pay | Admitting: Radiology

## 2017-01-11 DIAGNOSIS — M0579 Rheumatoid arthritis with rheumatoid factor of multiple sites without organ or systems involvement: Secondary | ICD-10-CM

## 2017-01-11 NOTE — Progress Notes (Signed)
Infusion orders updated today including CBC CMP Tylenol and Benadryl appointments are up to date and a follow up appointment is scheduled/ TB gold is due on 08/2017 

## 2017-01-17 ENCOUNTER — Other Ambulatory Visit: Payer: Self-pay | Admitting: *Deleted

## 2017-01-17 DIAGNOSIS — M797 Fibromyalgia: Secondary | ICD-10-CM

## 2017-01-19 ENCOUNTER — Other Ambulatory Visit (HOSPITAL_COMMUNITY): Payer: Self-pay | Admitting: *Deleted

## 2017-01-19 ENCOUNTER — Encounter (HOSPITAL_COMMUNITY): Admission: RE | Admit: 2017-01-19 | Payer: BLUE CROSS/BLUE SHIELD | Source: Ambulatory Visit

## 2017-01-20 ENCOUNTER — Encounter (HOSPITAL_COMMUNITY)
Admission: RE | Admit: 2017-01-20 | Discharge: 2017-01-20 | Disposition: A | Discharge status: Home / Self Care | Payer: BLUE CROSS/BLUE SHIELD | Source: Ambulatory Visit | Attending: Rheumatology | Admitting: Rheumatology

## 2017-01-20 DIAGNOSIS — M0579 Rheumatoid arthritis with rheumatoid factor of multiple sites without organ or systems involvement: Secondary | ICD-10-CM

## 2017-01-20 MED ORDER — DIPHENHYDRAMINE HCL 25 MG PO CAPS
25.0000 mg | ORAL_CAPSULE | ORAL | Status: DC
  Administered 2017-01-20: 25 mg via ORAL

## 2017-01-20 MED ORDER — DIPHENHYDRAMINE HCL 25 MG PO CAPS
ORAL_CAPSULE | ORAL | Status: AC
  Filled 2017-01-20: qty 1

## 2017-01-20 MED ORDER — ACETAMINOPHEN 325 MG PO TABS
ORAL_TABLET | ORAL | Status: AC
  Filled 2017-01-20: qty 2

## 2017-01-20 MED ORDER — ACETAMINOPHEN 325 MG PO TABS
650.0000 mg | ORAL_TABLET | ORAL | Status: DC
  Administered 2017-01-20: 650 mg via ORAL

## 2017-01-20 MED ORDER — SODIUM CHLORIDE 0.9 % IV SOLN
1000.0000 mg | INTRAVENOUS | Status: DC
  Administered 2017-01-20: 1000 mg via INTRAVENOUS
  Filled 2017-01-20: qty 40

## 2017-01-24 ENCOUNTER — Telehealth: Payer: Self-pay | Admitting: Radiology

## 2017-01-24 DIAGNOSIS — M533 Sacrococcygeal disorders, not elsewhere classified: Secondary | ICD-10-CM

## 2017-01-24 DIAGNOSIS — M797 Fibromyalgia: Secondary | ICD-10-CM

## 2017-01-24 NOTE — Telephone Encounter (Signed)
Patient asking for referral for Integrative therapy  Ok to refer ?

## 2017-01-24 NOTE — Telephone Encounter (Signed)
ok 

## 2017-01-25 NOTE — Telephone Encounter (Signed)
Left message to advise patient referral has been placed.  

## 2017-01-27 ENCOUNTER — Telehealth: Payer: Self-pay | Admitting: Rheumatology

## 2017-01-27 NOTE — Telephone Encounter (Signed)
We received an approval letter from [facility]. Please see document in "Media" dated 11/26/2016.   Able Malloy, [place], CPhT 4:31 PM

## 2017-01-27 NOTE — Telephone Encounter (Signed)
Insurance company calling to inform us that her Orencia injections have been approved thru 06/06/17. Case # [id number].

## 2017-02-10 ENCOUNTER — Telehealth: Payer: Self-pay | Admitting: Rheumatology

## 2017-02-10 NOTE — Telephone Encounter (Signed)
Patient called about orencia infusion authorization. Please call patient.

## 2017-02-10 NOTE — Telephone Encounter (Signed)
Patient returned call. She had questions about her authorization for Orencia infusions. I informed her that she has an authorization on file that is good until 06/06/17. Her next appointment is on 02/17/17. Patient voiced understanding and denied any questions at this time.   Lamika Connolly, [place], CPhT 3:27 PM

## 2017-02-17 ENCOUNTER — Encounter (HOSPITAL_COMMUNITY)
Admission: RE | Admit: 2017-02-17 | Discharge: 2017-02-17 | Disposition: A | Discharge status: Home / Self Care | Payer: BLUE CROSS/BLUE SHIELD | Source: Ambulatory Visit | Attending: Rheumatology | Admitting: Rheumatology

## 2017-02-17 DIAGNOSIS — M0579 Rheumatoid arthritis with rheumatoid factor of multiple sites without organ or systems involvement: Secondary | ICD-10-CM | POA: Diagnosis present

## 2017-02-17 LAB — COMPREHENSIVE METABOLIC PANEL
ALT: 16 U/L (ref 14–54)
AST: 21 U/L (ref 15–41)
Albumin: 3.1 g/dL — ABNORMAL LOW (ref 3.5–5.0)
Alkaline Phosphatase: 65 U/L (ref 38–126)
Anion gap: 6 (ref 5–15)
BILIRUBIN TOTAL: 0.5 mg/dL (ref 0.3–1.2)
BUN: 10 mg/dL (ref 6–20)
CALCIUM: 8.6 mg/dL — AB (ref 8.9–10.3)
CO2: 27 mmol/L (ref 22–32)
CREATININE: 0.93 mg/dL (ref 0.44–1.00)
Chloride: 107 mmol/L (ref 101–111)
GFR calc Af Amer: >60 mL/min (ref >60)
Glucose, Bld: 82 mg/dL (ref 65–99)
Potassium: 4.1 mmol/L (ref 3.5–5.1)
Sodium: 140 mmol/L (ref 135–145)
TOTAL PROTEIN: 6.1 g/dL — AB (ref 6.5–8.1)

## 2017-02-17 LAB — CBC
HEMATOCRIT: 41.2 % (ref 36.0–46.0)
Hemoglobin: 13 g/dL (ref 12.0–15.0)
MCH: 26.6 pg (ref 26.0–34.0)
MCHC: 31.6 g/dL (ref 30.0–36.0)
MCV: 84.3 fL (ref 78.0–100.0)
Platelets: 283 10*3/uL (ref 150–400)
RBC: 4.89 MIL/uL (ref 3.87–5.11)
RDW: 12.6 % (ref 11.5–15.5)
WBC: 11.2 10*3/uL — AB (ref 4.0–10.5)

## 2017-02-17 MED ORDER — ABATACEPT 250 MG IV SOLR
1000.0000 mg | INTRAVENOUS | Status: DC
  Administered 2017-02-17: 13:00:00 1000 mg via INTRAVENOUS
  Filled 2017-02-17: qty 40

## 2017-02-17 MED ORDER — ACETAMINOPHEN 325 MG PO TABS
ORAL_TABLET | ORAL | Status: AC
  Filled 2017-02-17: qty 2

## 2017-02-17 MED ORDER — ACETAMINOPHEN 325 MG PO TABS
650.0000 mg | ORAL_TABLET | ORAL | Status: DC
Start: 2017-02-17 — End: 2017-02-18
  Administered 2017-02-17: 12:00:00 650 mg via ORAL

## 2017-02-17 MED ORDER — DIPHENHYDRAMINE HCL 25 MG PO CAPS
ORAL_CAPSULE | ORAL | Status: AC
  Filled 2017-02-17: qty 1

## 2017-02-17 MED ORDER — DIPHENHYDRAMINE HCL 25 MG PO CAPS
25.0000 mg | ORAL_CAPSULE | ORAL | Status: DC
  Administered 2017-02-17: 12:00:00 25 mg via ORAL

## 2017-02-17 NOTE — Progress Notes (Signed)
stable °

## 2017-03-10 ENCOUNTER — Telehealth: Payer: Self-pay | Admitting: Radiology

## 2017-03-10 ENCOUNTER — Ambulatory Visit (INDEPENDENT_AMBULATORY_CARE_PROVIDER_SITE_OTHER): Payer: BLUE CROSS/BLUE SHIELD | Admitting: Rheumatology

## 2017-03-10 ENCOUNTER — Encounter: Payer: Self-pay | Admitting: Rheumatology

## 2017-03-10 VITALS — BP 133/95 | HR 92 | Resp 16 | Ht 62.0 in | Wt 258.0 lb

## 2017-03-10 DIAGNOSIS — R5383 Other fatigue: Secondary | ICD-10-CM | POA: Diagnosis not present

## 2017-03-10 DIAGNOSIS — Z79899 Other long term (current) drug therapy: Secondary | ICD-10-CM | POA: Diagnosis not present

## 2017-03-10 DIAGNOSIS — M0579 Rheumatoid arthritis with rheumatoid factor of multiple sites without organ or systems involvement: Secondary | ICD-10-CM

## 2017-03-10 DIAGNOSIS — M797 Fibromyalgia: Secondary | ICD-10-CM

## 2017-03-10 DIAGNOSIS — G4709 Other insomnia: Secondary | ICD-10-CM | POA: Diagnosis not present

## 2017-03-10 NOTE — Progress Notes (Signed)
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  Counseled patient on purpose, proper use, and adverse effects of Orencia. The most common adverse effects are increased risk of infections, headache, and injection site reactions or infusion reactions.  There is the possibility of an increased risk of malignancy but it is not well understood if this increased risk is due to the medication or the disease state.  Reviewed the importance of regular labs while on Orencia.  Counseled patient that Dub Amis should be held prior to scheduled surgery.  Counseled patient to avoid live vaccines while on Orencia.Provided patient with medication education material and answered all questions.  Patient consented to [facility].  Will upload consent into patient's chart.  Will apply for Orencia through patient's insurance.  Reviewed storage information for Orencia.  Advised initial injection must be administered in office.

## 2017-03-10 NOTE — Patient Instructions (Signed)
Abatacept solution for injection (subcutaneous or intravenous use)  What is this medicine?  ABATACEPT (a ba TA sept) is used to treat moderate to severe active rheumatoid arthritis or psoriatic arthritis in adults. This medicine is also used to treat juvenile idiopathic arthritis.  This medicine may be used for other purposes; ask your health care provider or pharmacist if you have questions.  COMMON BRAND NAME(S): Orencia  What should I tell my health care provider before I take this medicine?  They need to know if you have any of these conditions:  -are taking other medicines to treat rheumatoid arthritis  -COPD  -diabetes  -infection or history of infections  -recently received or scheduled to receive a vaccine  -scheduled to have surgery  -tuberculosis, a positive skin test for tuberculosis or have recently been in close contact with someone who has tuberculosis  -viral hepatitis  -an unusual or allergic reaction to abatacept, other medicines, foods, dyes, or preservatives  -pregnant or trying to get pregnant  -breast-feeding  How should I use this medicine?  This medicine is for infusion into a vein or for injection under the skin. Infusions are given by a health care professional in a hospital or clinic setting. If you are to give your own medicine at home, you will be taught how to prepare and give this medicine under the skin. Use exactly as directed. Take your medicine at regular intervals. Do not take your medicine more often than directed.  It is important that you put your used needles and syringes in a special sharps container. Do not put them in a trash can. If you do not have a sharps container, call your pharmacist or healthcare provider to get one.  Talk to your pediatrician regarding the use of this medicine in children. While infusions in a clinic may be prescribed for children as young as 2 years for selected conditions, precautions do apply.  Overdosage: If you think you have taken too much of  this medicine contact a poison control center or emergency room at once.  NOTE: This medicine is only for you. Do not share this medicine with others.  What if I miss a dose?  This medicine is used once a week if given by injection under the skin. If you miss a dose, take it as soon as you can. If it is almost time for your next dose, take only that dose. Do not take double or extra doses.  If you are to be given an infusion, it is important not to miss your dose. Doses are usually every 4 weeks. Call your doctor or health care professional if you are unable to keep an appointment.  What may interact with this medicine?  Do not take this medicine with any of the following medications:  -adalimumab  -anakinra  -certolizumab  -etanercept  -golimumab  -infliximab  -live virus vaccines  -rituximab  -tocilizumab  This medicine may also interact with the following medications:  -vaccines  This list may not describe all possible interactions. Give your health care provider a list of all the medicines, herbs, non-prescription drugs, or dietary supplements you use. Also tell them if you smoke, drink alcohol, or use illegal drugs. Some items may interact with your medicine.  What should I watch for while using this medicine?  Visit your doctor for regular check ups while you are taking this medicine. Tell your doctor or healthcare professional if your symptoms do not start to get better or if they   get worse.  Call your doctor or health care professional if you get a cold or other infection while receiving this medicine. Do not treat yourself. This medicine may decrease your body's ability to fight infection. Try to avoid being around people who are sick.  What side effects may I notice from receiving this medicine?  Side effects that you should report to your doctor or health care professional as soon as possible:  -allergic reactions like skin rash, itching or hives, swelling of the face, lips, or tongue  -breathing  problems  -chest pain  -signs of infection - fever or chills, cough, unusual tiredness, pain or trouble passing urine, or warm, red or painful skin  Side effects that usually do not require medical attention (report to your doctor or health care professional if they continue or are bothersome):  -dizziness  -headache  -nausea, vomiting  -sore throat  -stomach upset  This list may not describe all possible side effects. Call your doctor for medical advice about side effects. You may report side effects to FDA at 1-800-FDA-1088.  Where should I keep my medicine?  Infusions will be given in a hospital or clinic and will not be stored at home.  Storage for syringes given under the skin and stored at home:  Keep out of the reach of children. Store in a refrigerator between 2 and 8 degrees C (36 and 46 degrees F). Keep this medicine in the original container. Protect from light. Do not freeze. Throw away any unused medicine after the expiration date.  NOTE: This sheet is a summary. It may not cover all possible information. If you have questions about this medicine, talk to your doctor, pharmacist, or health care provider.   2018 [employer] (2015-12-11 10:07:35)

## 2017-03-10 NOTE — Telephone Encounter (Signed)
Orencia needs to be changed from IV infusions to Sub q injections can you get authorization ? Then communicate with Sue Lush for a nurse visit.

## 2017-03-10 NOTE — Progress Notes (Signed)
Office Visit Note  Patient: [name] [name]             Date of Birth: 1965-09-05           MRN: [id number]             PCP: Jonathon Resides, MD Referring: Jonathon Resides, MD Visit Date: 03/10/2017 Occupation: '@GUAROCC'$ @    Subjective:  Medication Management   History of Present Illness: [name] [name] is a 51 y.o. female  Last seen November 11, 2016   C/o orencia IV not lasting  The usual 4 weeks like before. Over the last 3 months, patient has noticedthat she requires Orencia by IV earlier then every 4 weeks. Originally, she was looking for to getting the shot it every 3 weeks. Lately, she is looking for to getting the shot when only 2 weeks have passed. She isn't that much discomfort at 2 weeks that she wishes she can get the shot then.  Hen the above episodes occur where she needs the red see [place] sooner than every 4 weeks, she's having discomfort swelling pain stiffness warmth to her bilateral wristbilateral hands bilateral ankles bilateral knees.She is unable to make a fist in the morning. She feels as bad during these flares as she did before sh  She continues to take the methotrexate as prescribed. (pt says she is taking 1 ML of methotrexate instead of the 0.9 mL's of methotrexate by mistake. She will go back to 0.9 at her next scheduled ose and maintain that dosage.).  Saw podiatrist for right foot pain. She said it was a Rheumatoid arthritis flare. Pt was given prednisone taper and got very good benefit. Also given soft cast & cam walker.  FMS ==> flaring. Seeing integrative therapy.  They are doing well. FMS RATED 10 out of 10.  Activities of Daily Living:  Patient reports morning stiffness for 1 hours.   Patient Reports nocturnal pain.  Difficulty dressing/grooming: Reports Difficulty climbing stairs: Reports Difficulty getting out of chair: Reports Difficulty using hands for taps, buttons, cutlery, and/or writing: Reports   No Rheumatology ROS completed.    PMFS History:  Patient Active Problem List   Diagnosis Date Noted  . High risk medication use 08/03/2016  . Fibromyalgia syndrome 08/03/2016  . Abnormal cardiac CT angiography   . Sacrococcygeal pain 02/20/2016  . Rheumatoid arthritis involving 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[place]) 01/24/2016  . 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 Dyslipidemia 01/30/2015  . MDD (major depressive disorder), recurrent severe, without psychosis ([place]) 12/22/2014  . Essential hypertension 08/25/2014  . Morbid obesity ([place]) 08/25/2014  . CAD (coronary artery disease), native coronary artery 08/24/2014  . Numbness and tingling in left arm   . Arm numbness left 08/22/2014  . Chest pain 08/22/2014  . Gastroesophageal reflux disease without esophagitis 07/23/2014  . Coronary artery calcification seen on CAT scan 02/19/2014  . Hip pain 10/23/2013  . Solitary pulmonary nodule 08/28/2013  . Chest pain, atypical 07/18/2013  . Heart palpitations 06/27/2013  . Cough 06/04/2013  . Diastolic dysfunction 89/38/1017  . SOB (shortness of breath) 05/09/2013  . Other malaise and fatigue 01/21/2013  . Stress and adjustment reaction 12/09/2012  . Right sided abdominal pain 11/29/2012  . 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/21/2012  . Morbid obesity with BMI of 50.0-59.9, adult ([place]) 11/21/2012  . Arthritis 07/13/2012  . Depression 07/13/2012  . Routine health maintenance 05/07/2012    Past Medical History:  Diagnosis Date  . Anxiety   .  Coronary artery calcification seen on CAT scan    minimal CAD with 30% prox and mild LAD  . Depression   . Diastolic dysfunction   . Dyslipidemia 01/30/2015  . Esophageal ring   . Fibromyalgia   . Hiatal hernia   . HSV-1 infection   . Morbid obesity ([place])   . Rheumatoid arthritis(714.0)    M05.79  . Rheumatoid arthritis, seropositive, 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[place])    Treated with Orencia, TB neg 09/12/2015    Family History  Problem Relation Age of Onset  . Hyperlipidemia Mother     . Depression Mother   . Sarcoidosis Father   . Lung disease Father        Pleural Mesothelioma  . Cancer Father   . Heart attack Paternal Grandmother   . Hypertension Paternal Grandmother   . Hypertension Maternal Grandmother   . Diabetes Maternal Grandmother   . Stroke Neg Hx   . Colon cancer Neg Hx   . Esophageal cancer Neg Hx   . Rectal cancer Neg Hx   . Stomach cancer Neg Hx    Past Surgical History:  Procedure Laterality Date  . CARDIAC CATHETERIZATION N/A 06/11/2016   Procedure: Left Heart Cath and Coronary Angiography;  Surgeon: Jettie Booze, MD;  Location: [facility];  Service: Cardiovascular;  Laterality: N/A;  . CESAREAN SECTION    . ESOPHAGEAL MANOMETRY N/A 10/28/2014   Procedure: ESOPHAGEAL MANOMETRY (EM);  Surgeon: Irene Shipper, MD;  Location: [facility];  Service: Endoscopy;  Laterality: N/A;  . HERNIA REPAIR     reports surgery on 3 hernias, with 2 more present  . LAPAROSCOPIC GASTRIC SLEEVE RESECTION  11/21/12   [facility]  . LEFT HEART CATHETERIZATION WITH CORONARY ANGIOGRAM N/A 08/26/2014   Procedure: LEFT HEART CATHETERIZATION WITH CORONARY ANGIOGRAM;  Surgeon: Lorretta Harp, MD;  Location: [facility];  Service: Cardiovascular;  Laterality: N/A;  . TUBAL LIGATION     Social History   Social History Narrative   Marital Status: Married Aeronautical engineer)    Children:  Son Marcello Moores) Daughter Margreta Journey)    Pets: None    Living Situation: Lives with husband and children    Occupation: Radiation protection practitioner Scientist, clinical (histocompatibility and immunogenetics))     Education: Dietitian (Psychology)     Tobacco Use/Exposure:  None    Alcohol Use:  Occasional   Drug Use:  None   Diet:  Regular   Exercise: Walking or Treadmill (2 x week)   Hobbies: Barrister's clerk, [employer] Decorations.                 Objective: Vital Signs: BP (!) 133/95   Pulse 92   Resp 16   Ht '5\' 2"'$  (1.575 m)   Wt 258 lb (117 kg)   BMI 47.19 kg/m    Physical Exam   Musculoskeletal Exam:  Full  range of motion of all joints Grip strength is equal and strong bilaterally Fiber myalgia tender points are 18 out of 18 positive  CDAI Exam: No CDAI exam completed.  No synovitis on examination (Patient does have flare of her rheumatoid arthritis before her next infusion of Orencia is due.) During this time, she has pain to bilateral elbowsshoulders wrists hands knees ankles and feet.  Investigation: No additional findings. Hospital Outpatient Visit on 02/17/2017  Component Date Value Ref Range Status  . WBC 02/17/2017 11.2* 4.0 - 10.5 K/uL Final  . RBC 02/17/2017 4.89  3.87 - 5.11 MIL/uL Final  . Hemoglobin 02/17/2017 13.0  12.0 - 15.0 g/dL Final  . HCT 02/17/2017 41.2  36.0 - 46.0 % Final  . MCV 02/17/2017 84.3  78.0 - 100.0 fL Final  . MCH 02/17/2017 26.6  26.0 - 34.0 pg Final  . MCHC 02/17/2017 31.6  30.0 - 36.0 g/dL Final  . RDW 02/17/2017 12.6  11.5 - 15.5 % Final  . Platelets 02/17/2017 283  150 - 400 K/uL Final  . Sodium 02/17/2017 140  135 - 145 mmol/L Final  . Potassium 02/17/2017 4.1  3.5 - 5.1 mmol/L Final  . Chloride 02/17/2017 107  101 - 111 mmol/L Final  . CO2 02/17/2017 27  22 - 32 mmol/L Final  . Glucose, Bld 02/17/2017 82  65 - 99 mg/dL Final  . BUN 02/17/2017 10  6 - 20 mg/dL Final  . Creatinine, Ser 02/17/2017 0.93  0.44 - 1.00 mg/dL Final  . Calcium 02/17/2017 8.6* 8.9 - 10.3 mg/dL Final  . Total Protein 02/17/2017 6.1* 6.5 - 8.1 g/dL Final  . Albumin 02/17/2017 3.1* 3.5 - 5.0 g/dL Final  . AST 02/17/2017 21  15 - 41 U/L Final  . ALT 02/17/2017 16  14 - 54 U/L Final  . Alkaline Phosphatase 02/17/2017 65  38 - 126 U/L Final  . Total Bilirubin 02/17/2017 0.5  0.3 - 1.2 mg/dL Final  . GFR calc non Af Amer 02/17/2017 >60  >60 mL/min Final  . GFR calc Af Amer 02/17/2017 >60  >60 mL/min Final   Comment: (NOTE) The eGFR has been calculated using the CKD EPI equation. This calculation has not been validated in all clinical situations. eGFR's persistently <60  mL/min signify possible Chronic Kidney Disease.   . Anion gap 02/17/2017 6  5 - 15 Final  Admission on 01/01/2017, Discharged on 01/02/2017  Component Date Value Ref Range Status  . Glucose-Capillary 01/02/2017 93  65 - 99 mg/dL Final  . Sodium 01/01/2017 139  135 - 145 mmol/L Final  . Potassium 01/01/2017 3.5  3.5 - 5.1 mmol/L Final  . Chloride 01/01/2017 110  101 - 111 mmol/L Final  . CO2 01/01/2017 22  22 - 32 mmol/L Final  . Glucose, Bld 01/01/2017 101* 65 - 99 mg/dL Final  . BUN 01/01/2017 12  6 - 20 mg/dL Final  . Creatinine, Ser 01/01/2017 0.77  0.44 - 1.00 mg/dL Final  . Calcium 01/01/2017 8.8* 8.9 - 10.3 mg/dL Final  . GFR calc non Af Amer 01/01/2017 >60  >60 mL/min Final  . GFR calc Af Amer 01/01/2017 >60  >60 mL/min Final   Comment: (NOTE) The eGFR has been calculated using the CKD EPI equation. This calculation has not been validated in all clinical situations. eGFR's persistently <60 mL/min signify possible Chronic Kidney Disease.   . Anion gap 01/01/2017 7  5 - 15 Final  . Troponin i, poc 01/01/2017 0.03  0.00 - 0.08 ng/mL Final  . Comment 3 01/01/2017          Final   Comment: Due to the release kinetics of cTnI, a negative result within the first hours of the onset of symptoms does not rule out myocardial infarction with certainty. If myocardial infarction is still suspected, repeat the test at appropriate intervals.   . WBC 01/01/2017 11.8* 4.0 - 10.5 K/uL Final  . RBC 01/01/2017 4.86  3.87 - 5.11 MIL/uL Final  . Hemoglobin 01/01/2017 13.2  12.0 - 15.0 g/dL Final  . HCT 01/01/2017 41.1  36.0 - 46.0 % Final  . MCV 01/01/2017 84.6  78.0 - 100.0 fL Final  . MCH 01/01/2017 27.2  26.0 - 34.0 pg Final  . MCHC 01/01/2017 32.1  30.0 - 36.0 g/dL Final  . RDW 01/01/2017 12.9  11.5 - 15.5 % Final  . Platelets 01/01/2017 257  150 - 400 K/uL Final  . I-stat hCG, quantitative 01/01/2017 <5.0  <5 mIU/mL Final  . Comment 3 01/01/2017          Final   Comment:   GEST. AGE       CONC.  (mIU/mL)   <=1 WEEK        5 - 50     2 WEEKS       50 - 500     3 WEEKS       100 - 10,000     4 WEEKS     1,000 - 30,000        FEMALE AND NON-PREGNANT FEMALE:     LESS THAN 5 mIU/mL   Hospital Outpatient Visit on 12/22/2016  Component Date Value Ref Range Status  . WBC 12/22/2016 7.8  4.0 - 10.5 K/uL Final  . RBC 12/22/2016 4.69  3.87 - 5.11 MIL/uL Final  . Hemoglobin 12/22/2016 12.8  12.0 - 15.0 g/dL Final  . HCT 12/22/2016 40.4  36.0 - 46.0 % Final  . MCV 12/22/2016 86.1  78.0 - 100.0 fL Final  . MCH 12/22/2016 27.3  26.0 - 34.0 pg Final  . MCHC 12/22/2016 31.7  30.0 - 36.0 g/dL Final  . RDW 12/22/2016 13.0  11.5 - 15.5 % Final  . Platelets 12/22/2016 298  150 - 400 K/uL Final  . Neutrophils Relative % 12/22/2016 47  % Final  . Neutro Abs 12/22/2016 3.8  1.7 - 7.7 K/uL Final  . Lymphocytes Relative 12/22/2016 42  % Final  . Lymphs Abs 12/22/2016 3.3  0.7 - 4.0 K/uL Final  . Monocytes Relative 12/22/2016 8  % Final  . Monocytes Absolute 12/22/2016 0.6  0.1 - 1.0 K/uL Final  . Eosinophils Relative 12/22/2016 2  % Final  . Eosinophils Absolute 12/22/2016 0.1  0.0 - 0.7 K/uL Final  . Basophils Relative 12/22/2016 1  % Final  . Basophils Absolute 12/22/2016 0.0  0.0 - 0.1 K/uL Final  . Sodium 12/22/2016 140  135 - 145 mmol/L Final  . Potassium 12/22/2016 4.1  3.5 - 5.1 mmol/L Final  . Chloride 12/22/2016 108  101 - 111 mmol/L Final  . CO2 12/22/2016 28  22 - 32 mmol/L Final  . Glucose, Bld 12/22/2016 90  65 - 99 mg/dL Final  . BUN 12/22/2016 9  6 - 20 mg/dL Final  . Creatinine, Ser 12/22/2016 0.97  0.44 - 1.00 mg/dL Final  . Calcium 12/22/2016 8.7* 8.9 - 10.3 mg/dL Final  . Total Protein 12/22/2016 6.1* 6.5 - 8.1 g/dL Final  . Albumin 12/22/2016 3.0* 3.5 - 5.0 g/dL Final  . AST 12/22/2016 18  15 - 41 U/L Final  . ALT 12/22/2016 12* 14 - 54 U/L Final  . Alkaline Phosphatase 12/22/2016 61  38 - 126 U/L Final  . Total Bilirubin 12/22/2016 0.7  0.3 - 1.2 mg/dL Final    . GFR calc non Af Amer 12/22/2016 >60  >60 mL/min Final  . GFR calc Af Amer 12/22/2016 >60  >60 mL/min Final   Comment: (NOTE) The eGFR has been calculated using the CKD EPI equation. This calculation has not been validated in all clinical situations. eGFR's persistently <60 mL/min signify possible Chronic Kidney  Disease.   Georgiann Hahn gap 12/22/2016 4* 5 - 15 Final  Hospital Outpatient Visit on 10/27/2016  Component Date Value Ref Range Status  . WBC 10/27/2016 8.2  4.0 - 10.5 K/uL Final  . RBC 10/27/2016 4.94  3.87 - 5.11 MIL/uL Final  . Hemoglobin 10/27/2016 13.4  12.0 - 15.0 g/dL Final  . HCT 10/27/2016 41.7  36.0 - 46.0 % Final  . MCV 10/27/2016 84.4  78.0 - 100.0 fL Final  . MCH 10/27/2016 27.1  26.0 - 34.0 pg Final  . MCHC 10/27/2016 32.1  30.0 - 36.0 g/dL Final  . RDW 10/27/2016 13.6  11.5 - 15.5 % Final  . Platelets 10/27/2016 271  150 - 400 K/uL Final  . Neutrophils Relative % 10/27/2016 46  % Final  . Neutro Abs 10/27/2016 3.8  1.7 - 7.7 K/uL Final  . Lymphocytes Relative 10/27/2016 44  % Final  . Lymphs Abs 10/27/2016 3.6  0.7 - 4.0 K/uL Final  . Monocytes Relative 10/27/2016 7  % Final  . Monocytes Absolute 10/27/2016 0.5  0.1 - 1.0 K/uL Final  . Eosinophils Relative 10/27/2016 2  % Final  . Eosinophils Absolute 10/27/2016 0.2  0.0 - 0.7 K/uL Final  . Basophils Relative 10/27/2016 1  % Final  . Basophils Absolute 10/27/2016 0.1  0.0 - 0.1 K/uL Final  . Sodium 10/27/2016 138  135 - 145 mmol/L Final  . Potassium 10/27/2016 4.0  3.5 - 5.1 mmol/L Final  . Chloride 10/27/2016 107  101 - 111 mmol/L Final  . CO2 10/27/2016 24  22 - 32 mmol/L Final  . Glucose, Bld 10/27/2016 91  65 - 99 mg/dL Final  . BUN 10/27/2016 7  6 - 20 mg/dL Final  . Creatinine, Ser 10/27/2016 0.81  0.44 - 1.00 mg/dL Final  . Calcium 10/27/2016 8.9  8.9 - 10.3 mg/dL Final  . Total Protein 10/27/2016 6.6  6.5 - 8.1 g/dL Final  . Albumin 10/27/2016 3.4* 3.5 - 5.0 g/dL Final  . AST 10/27/2016 19  15 -  41 U/L Final  . ALT 10/27/2016 12* 14 - 54 U/L Final  . Alkaline Phosphatase 10/27/2016 63  38 - 126 U/L Final  . Total Bilirubin 10/27/2016 0.5  0.3 - 1.2 mg/dL Final  . GFR calc non Af Amer 10/27/2016 >60  >60 mL/min Final  . GFR calc Af Amer 10/27/2016 >60  >60 mL/min Final   Comment: (NOTE) The eGFR has been calculated using the CKD EPI equation. This calculation has not been validated in all clinical situations. eGFR's persistently <60 mL/min signify possible Chronic Kidney Disease.   . Anion gap 10/27/2016 7  5 - 15 Final  Orders Only on 09/29/2016  Component Date Value Ref Range Status  . Potassium 09/29/2016 3.8  3.5 - 5.3 mmol/L Final  Orders Only on 09/29/2016  Component Date Value Ref Range Status  . WBC 09/29/2016 8.6  4.0 - 10.5 K/uL Final  . RBC 09/29/2016 5.01  3.87 - 5.11 MIL/uL Final  . Hemoglobin 09/29/2016 13.4  12.0 - 15.0 g/dL Final  . HCT 09/29/2016 42.3  36.0 - 46.0 % Final  . MCV 09/29/2016 84.4  78.0 - 100.0 fL Final  . MCH 09/29/2016 26.7  26.0 - 34.0 pg Final  . MCHC 09/29/2016 31.7  30.0 - 36.0 g/dL Final  . RDW 09/29/2016 13.8  11.5 - 15.5 % Final  . Platelets 09/29/2016 267  150 - 400 K/uL Final  . Neutrophils Relative % 09/29/2016 46  %  Final  . Neutro Abs 09/29/2016 4.0  1.7 - 7.7 K/uL Final  . Lymphocytes Relative 09/29/2016 45  % Final  . Lymphs Abs 09/29/2016 4.0  0.7 - 4.0 K/uL Final  . Monocytes Relative 09/29/2016 6  % Final  . Monocytes Absolute 09/29/2016 0.5  0.1 - 1.0 K/uL Final  . Eosinophils Relative 09/29/2016 2  % Final  . Eosinophils Absolute 09/29/2016 0.1  0.0 - 0.7 K/uL Final  . Basophils Relative 09/29/2016 1  % Final  . Basophils Absolute 09/29/2016 0.1  0.0 - 0.1 K/uL Final  . Sodium 09/29/2016 139  135 - 145 mmol/L Final  . Potassium 09/29/2016 5.5* 3.5 - 5.1 mmol/L Final  . Chloride 09/29/2016 106  101 - 111 mmol/L Final  . CO2 09/29/2016 27  22 - 32 mmol/L Final  . Glucose, Bld 09/29/2016 71  65 - 99 mg/dL Final  . BUN  09/29/2016 7  6 - 20 mg/dL Final  . Creatinine, Ser 09/29/2016 0.86  0.44 - 1.00 mg/dL Final  . Calcium 09/29/2016 8.9  8.9 - 10.3 mg/dL Final  . Total Protein 09/29/2016 6.7  6.5 - 8.1 g/dL Final  . Albumin 09/29/2016 3.5  3.5 - 5.0 g/dL Final  . AST 09/29/2016 30  15 - 41 U/L Final  . ALT 09/29/2016 20  14 - 54 U/L Final  . Alkaline Phosphatase 09/29/2016 66  38 - 126 U/L Final  . Total Bilirubin 09/29/2016 1.1  0.3 - 1.2 mg/dL Final  . GFR calc non Af Amer 09/29/2016 >60  >60 mL/min Final  . GFR calc Af Amer 09/29/2016 >60  >60 mL/min Final   Comment: (NOTE) The eGFR has been calculated using the CKD EPI equation. This calculation has not been validated in all clinical situations. eGFR's persistently <60 mL/min signify possible Chronic Kidney Disease.   . Anion gap 09/29/2016 6  5 - 15 Final     Imaging: No results found.  Speciality Comments: No specialty comments available.    Procedures:  No procedures performed Allergies: Influenza vaccines and Isosorbide   Assessment / Plan:     Visit Diagnoses: Rheumatoid arthritis involving multiple sites with positive rheumatoid factor (HCC)  High risk medication use  Fibromyalgia  Fatigue, unspecified type  Other insomnia   Plan: #1: Rheumatoid arthritis with positive rheumatoid factor. Having a flare currently. Having inadequate response to her current treatment. Patient reports that her Orencia infusion used to last every 4 weeks.Now over the last 3 months, her pain begins to occur 3 weeks after her infusion. Last month, her pain was occurring 2 weeks out the infusion. She has not missed any doses and feels that she is getting inadequate response to Orencia now. She continues to take her methotrexate. She was taking 1 mL every week. At the last office visit, we wanted the patient to be taking 22.5 mg (0.9 mL's) every week. I've advised the patient to go back to 0.9 mL's  #2: High risk prescription Inadequate response to  Orencia IV every 4 weeks. His taking methotrexate 25 mg per week but we've asked the patient to take 22.5 mg per week as discussed at the last office visit. Patient is agreeable. For now, we will do weekly injectable Orencia and see if this formulation works more adequately for the patient's rheumatoid arthritis.  Ok to consider actemra or xeljanz in future; pt wants to have orencia injection.  Will come to our office next  1-2 week and get her first injection next  week.(patient is due for her Orencia infusion 03/22/2017)  Apply for Orencia weekly injection I spoke with Amy and she will send a message to [place] and coordinate with Seth Bake.  Once approved, we will switch the patient overto Orencia weekly inj.  Return to clinic in 3 months for follow-up to see how well she is responding to weekly Orencia injection.    Orders: No orders of the defined types were placed in this encounter.  No orders of the defined types were placed in this encounter.   Face-to-face time spent with patient was 30 minutes. 50% of time was spent in counseling and coordination of care.  Follow-Up Instructions: Return in about 2 weeks (around 03/24/2017) for ra,fms,orencia infusion failure, switch to orencia weekly inj.   Eliezer Lofts, PA-C I examined and evaluated the patient with Eliezer Lofts PA. I had detailed discussion with patient regarding different treatment options. We discussed the option of switching her to Actemra or [place]. She is failed anti-TNF's in the past. She did not like the side effects of Actemra and Xeljanz especially increase in LDL. At this point she wants to switch to subcutaneous Orencia. We will apply for Orencia subcutaneous at this point. The plan of care was discussed as noted above.  Bo Merino, MD Note - This record has been created using Editor, commissioning.  Chart creation errors have been sought, but may not always  have been located. Such creation errors do not  reflect on  the standard of medical care.
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
## 2017-03-15 ENCOUNTER — Other Ambulatory Visit: Payer: Self-pay | Admitting: *Deleted

## 2017-03-15 NOTE — Progress Notes (Signed)
Infusion orders are current for patient CBC CMP Tylenol Benadryl appointments are up to date and follow up appointment  is scheduled TB gold not due yet, due on 09/01/2017.

## 2017-03-16 ENCOUNTER — Other Ambulatory Visit: Payer: Self-pay | Admitting: Cardiology

## 2017-03-17 ENCOUNTER — Encounter (HOSPITAL_COMMUNITY): Payer: Self-pay

## 2017-03-22 ENCOUNTER — Inpatient Hospital Stay (HOSPITAL_COMMUNITY): Admission: RE | Admit: 2017-03-22 | Payer: Self-pay | Source: Ambulatory Visit

## 2017-03-23 DIAGNOSIS — M533 Sacrococcygeal disorders, not elsewhere classified: Secondary | ICD-10-CM | POA: Insufficient documentation

## 2017-03-28 ENCOUNTER — Encounter (HOSPITAL_COMMUNITY): Admission: RE | Admit: 2017-03-28 | Payer: BLUE CROSS/BLUE SHIELD | Source: Ambulatory Visit

## 2017-03-31 ENCOUNTER — Other Ambulatory Visit (HOSPITAL_COMMUNITY): Payer: Self-pay

## 2017-04-01 ENCOUNTER — Encounter (HOSPITAL_COMMUNITY)
Admission: RE | Admit: 2017-04-01 | Discharge: 2017-04-01 | Disposition: A | Discharge status: Home / Self Care | Payer: BLUE CROSS/BLUE SHIELD | Source: Ambulatory Visit | Attending: Rheumatology | Admitting: Rheumatology

## 2017-04-01 DIAGNOSIS — M0579 Rheumatoid arthritis with rheumatoid factor of multiple sites without organ or systems involvement: Secondary | ICD-10-CM | POA: Insufficient documentation

## 2017-04-01 MED ORDER — DIPHENHYDRAMINE HCL 25 MG PO CAPS
ORAL_CAPSULE | ORAL | Status: AC
  Administered 2017-04-01: 12:00:00 25 mg via ORAL
  Filled 2017-04-01: qty 1

## 2017-04-01 MED ORDER — DIPHENHYDRAMINE HCL 25 MG PO CAPS
25.0000 mg | ORAL_CAPSULE | ORAL | Status: DC
  Administered 2017-04-01: 25 mg via ORAL

## 2017-04-01 MED ORDER — ACETAMINOPHEN 325 MG PO TABS
ORAL_TABLET | ORAL | Status: AC
  Administered 2017-04-01: 650 mg via ORAL
  Filled 2017-04-01: qty 2

## 2017-04-01 MED ORDER — ACETAMINOPHEN 325 MG PO TABS
650.0000 mg | ORAL_TABLET | ORAL | Status: DC
  Administered 2017-04-01: 650 mg via ORAL

## 2017-04-01 MED ORDER — SODIUM CHLORIDE 0.9 % IV SOLN
1000.0000 mg | INTRAVENOUS | Status: DC
  Administered 2017-04-01: 12:00:00 1000 mg via INTRAVENOUS
  Filled 2017-04-01: qty 40

## 2017-04-13 ENCOUNTER — Ambulatory Visit: Payer: BLUE CROSS/BLUE SHIELD | Admitting: Rheumatology

## 2017-04-15 ENCOUNTER — Other Ambulatory Visit (INDEPENDENT_AMBULATORY_CARE_PROVIDER_SITE_OTHER): Payer: Self-pay | Admitting: Specialist

## 2017-04-18 NOTE — Telephone Encounter (Signed)
Cyclobenzarine refill request

## 2017-05-02 ENCOUNTER — Other Ambulatory Visit: Payer: Self-pay | Admitting: *Deleted

## 2017-05-02 ENCOUNTER — Encounter (HOSPITAL_COMMUNITY)
Admission: RE | Admit: 2017-05-02 | Discharge: 2017-05-02 | Disposition: A | Discharge status: Home / Self Care | Payer: BLUE CROSS/BLUE SHIELD | Source: Ambulatory Visit | Attending: Rheumatology | Admitting: Rheumatology

## 2017-05-02 DIAGNOSIS — M0579 Rheumatoid arthritis with rheumatoid factor of multiple sites without organ or systems involvement: Secondary | ICD-10-CM

## 2017-05-02 LAB — CBC
HEMATOCRIT: 40.8 % (ref 36.0–46.0)
Hemoglobin: 12.8 g/dL (ref 12.0–15.0)
MCH: 26.7 pg (ref 26.0–34.0)
MCHC: 31.4 g/dL (ref 30.0–36.0)
MCV: 85.2 fL (ref 78.0–100.0)
PLATELETS: 262 10*3/uL (ref 150–400)
RBC: 4.79 MIL/uL (ref 3.87–5.11)
RDW: 13.4 % (ref 11.5–15.5)
WBC: 8.6 10*3/uL (ref 4.0–10.5)

## 2017-05-02 LAB — COMPREHENSIVE METABOLIC PANEL
ALBUMIN: 3 g/dL — AB (ref 3.5–5.0)
ALT: 20 U/L (ref 14–54)
AST: 26 U/L (ref 15–41)
Alkaline Phosphatase: 71 U/L (ref 38–126)
Anion gap: 5 (ref 5–15)
BILIRUBIN TOTAL: 0.3 mg/dL (ref 0.3–1.2)
BUN: 10 mg/dL (ref 6–20)
CO2: 27 mmol/L (ref 22–32)
CREATININE: 0.89 mg/dL (ref 0.44–1.00)
Calcium: 8.7 mg/dL — ABNORMAL LOW (ref 8.9–10.3)
Chloride: 108 mmol/L (ref 101–111)
GFR calc Af Amer: >60 mL/min (ref >60)
GLUCOSE: 81 mg/dL (ref 65–99)
Potassium: 3.4 mmol/L — ABNORMAL LOW (ref 3.5–5.1)
Sodium: 140 mmol/L (ref 135–145)
TOTAL PROTEIN: 6.3 g/dL — AB (ref 6.5–8.1)

## 2017-05-02 MED ORDER — ACETAMINOPHEN 325 MG PO TABS
ORAL_TABLET | ORAL | Status: AC
  Administered 2017-05-02: 650 mg
  Filled 2017-05-02: qty 2

## 2017-05-02 MED ORDER — DIPHENHYDRAMINE HCL 25 MG PO CAPS: 25.0000 mg | ORAL_CAPSULE | ORAL | Status: DC

## 2017-05-02 MED ORDER — DIPHENHYDRAMINE HCL 25 MG PO CAPS
ORAL_CAPSULE | ORAL | Status: AC
  Administered 2017-05-02: 12:00:00 25 mg
  Filled 2017-05-02: qty 1

## 2017-05-02 MED ORDER — SODIUM CHLORIDE 0.9 % IV SOLN
1000.0000 mg | INTRAVENOUS | Status: DC
  Administered 2017-05-02: 1000 mg via INTRAVENOUS
  Filled 2017-05-02: qty 40

## 2017-05-02 MED ORDER — ACETAMINOPHEN 325 MG PO TABS: 650.0000 mg | ORAL_TABLET | ORAL | Status: DC

## 2017-05-02 NOTE — Progress Notes (Signed)
WNL

## 2017-05-02 NOTE — Progress Notes (Signed)
Potassium is low. Rest the labs are stable. Please notify patient and fax results to her PCP

## 2017-05-04 DIAGNOSIS — M7711 Lateral epicondylitis, right elbow: Secondary | ICD-10-CM | POA: Insufficient documentation

## 2017-05-13 ENCOUNTER — Emergency Department (HOSPITAL_BASED_OUTPATIENT_CLINIC_OR_DEPARTMENT_OTHER)
Admission: EM | Admit: 2017-05-13 | Discharge: 2017-05-13 | Disposition: A | Discharge status: Home / Self Care | Payer: BLUE CROSS/BLUE SHIELD | Attending: Emergency Medicine | Admitting: Emergency Medicine

## 2017-05-13 ENCOUNTER — Telehealth: Payer: Self-pay

## 2017-05-13 ENCOUNTER — Other Ambulatory Visit: Payer: Self-pay

## 2017-05-13 ENCOUNTER — Encounter (HOSPITAL_BASED_OUTPATIENT_CLINIC_OR_DEPARTMENT_OTHER): Payer: Self-pay | Admitting: *Deleted

## 2017-05-13 DIAGNOSIS — I2510 Atherosclerotic heart disease of native coronary artery without angina pectoris: Secondary | ICD-10-CM | POA: Insufficient documentation

## 2017-05-13 DIAGNOSIS — Z79899 Other long term (current) drug therapy: Secondary | ICD-10-CM | POA: Insufficient documentation

## 2017-05-13 DIAGNOSIS — B349 Viral infection, unspecified: Secondary | ICD-10-CM | POA: Diagnosis not present

## 2017-05-13 DIAGNOSIS — Z7982 Long term (current) use of aspirin: Secondary | ICD-10-CM | POA: Insufficient documentation

## 2017-05-13 DIAGNOSIS — R0981 Nasal congestion: Secondary | ICD-10-CM | POA: Diagnosis present

## 2017-05-13 MED ORDER — PROMETHAZINE-DM 6.25-15 MG/5ML PO SYRP: 5.0000 mL | ORAL_SOLUTION | Freq: Four times a day (QID) | ORAL | 0 refills | Status: DC | PRN

## 2017-05-13 NOTE — Telephone Encounter (Signed)
Called the patient to verify benefits for 2019. Pt currently receives infusions that may require a pre-certification. Left message for patient to call back.   [name] [name], [place], CPhT 8:51 AM

## 2017-05-13 NOTE — ED Triage Notes (Signed)
URI x 2-3 days. Pt here with her son who is also a patient.

## 2017-05-13 NOTE — ED Provider Notes (Signed)
[facility] EMERGENCY DEPARTMENT Provider Note   CSN: [id number] Arrival date & time: 05/13/17  2158     History   Chief Complaint Chief Complaint  Patient presents with  . URI    HPI [name] [name] is a 51 y.o. female.  HPI   51 year old female with history of fibromyalgia, rheumatoid arthritis, anxiety presenting with cold symptoms.  For the past 2 days patient reports sinus headache, congestion, throat irritation, pain in her neck and having some chills.  Symptoms moderate in severity.  She is tried some Motrin without adequate relief.  She also report having increased mucus production.  She denies nausea vomiting or diarrhea, trouble swallowing, chest pain or shortness of breath.  No productive cough or hemoptysis.  She does have history of rheumatoid arthritis and currently taking immunosuppressive medication.  Past Medical History:  Diagnosis Date  . Anxiety   . Coronary artery calcification seen on CAT scan    minimal CAD with 30% prox and mild LAD  . Depression   . Diastolic dysfunction   . Dyslipidemia 01/30/2015  . Esophageal ring   . Fibromyalgia   . Hiatal hernia   . HSV-1 infection   . Morbid obesity (HCC)   . Rheumatoid arthritis(714.0)    M05.79  . Rheumatoid arthritis, seropositive, 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HCC)    Treated with Orencia, TB neg 09/12/2015    Patient Active Problem List   Diagnosis Date Noted  . High risk medication use 08/03/2016  . Fibromyalgia syndrome 08/03/2016  . Abnormal cardiac CT angiography   . Sacrococcygeal pain 02/20/2016  . Rheumatoid arthritis involving 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HCC) 01/24/2016  . 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 Dyslipidemia 01/30/2015  . 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 Essential hypertension 08/25/2014  . Morbid obesity (HCC) 08/25/2014  . CAD (coronary artery disease), native coronary artery 08/24/2014  . Numbness and tingling in left arm    . Arm numbness left 08/22/2014  . Chest pain 08/22/2014  . Gastroesophageal reflux disease without esophagitis 07/23/2014  . Coronary artery calcification seen on CAT scan 02/19/2014  . Hip pain 10/23/2013  . Solitary pulmonary nodule 08/28/2013  . Chest pain, atypical 07/18/2013  . Heart palpitations 06/27/2013  . Cough 06/04/2013  . Diastolic dysfunction 05/18/2013  . SOB (shortness of breath) 05/09/2013  . Other malaise and fatigue 01/21/2013  . Stress and adjustment reaction 12/09/2012  . Right sided abdominal pain 11/29/2012  . 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/21/2012  . Morbid obesity with BMI of 50.0-59.9, adult (HCC) 11/21/2012  . Arthritis 07/13/2012  . Depression 07/13/2012  . Routine health maintenance 05/07/2012    Past Surgical History:  Procedure Laterality Date  . CARDIAC CATHETERIZATION N/A 06/11/2016   Procedure: Left Heart Cath and Coronary Angiography;  Surgeon: Corky Crafts, MD;  Location: [facility] INVASIVE CV LAB;  Service: Cardiovascular;  Laterality: N/A;  . CESAREAN SECTION    . ESOPHAGEAL MANOMETRY N/A 10/28/2014   Procedure: ESOPHAGEAL MANOMETRY (EM);  Surgeon: Hilarie Fredrickson, MD;  Location: [facility] ENDOSCOPY;  Service: Endoscopy;  Laterality: N/A;  . HERNIA REPAIR     reports surgery on 3 hernias, with 2 more present  . LAPAROSCOPIC GASTRIC SLEEVE RESECTION  11/21/12   [facility]  . LEFT HEART CATHETERIZATION WITH CORONARY ANGIOGRAM N/A 08/26/2014   Procedure: LEFT HEART CATHETERIZATION WITH CORONARY ANGIOGRAM;  Surgeon: Runell Gess, MD;  Location: [facility] CATH LAB;  Service: Cardiovascular;  Laterality: N/A;  . TUBAL  LIGATION      OB History    No data available       Home Medications    Prior to Admission medications   Medication Sig Start Date End Date Taking? Authorizing Provider  Abatacept (ORENCIA IV) Inject 1,000 mg into the vein every 28 (twenty-eight) days.     [provider]  Adapalene 0.3 % gel Apply 1 application  topically daily as needed (acne).  12/31/14   [provider]  amitriptyline (ELAVIL) 10 MG tablet Take 10 mg by mouth at bedtime.    [provider]  ARIPiprazole (ABILIFY) 2 MG tablet Take 1 tablet (2 mg total) by mouth daily. 04/22/15   Benjaman Pott, MD  aspirin EC 81 MG tablet Take 81 mg by mouth daily.    [provider]  atorvastatin (LIPITOR) 80 MG tablet Take 1 tablet (80 mg total) by mouth daily at 6 PM. 05/20/16   Sharol Harness, Roxy Horseman, PA-C  Betamethasone Valerate 0.12 % foam Apply 1 application topically daily as needed. Itching 04/22/15   [provider]  buPROPion (WELLBUTRIN XL) 300 MG 24 hr tablet Take 1 tablet (300 mg total) by mouth daily. 12/28/14   Adonis Brook, NP  clindamycin-benzoyl peroxide (BENZACLIN) gel Apply 1 application topically daily as needed. Acne 02/13/15   [provider]  clobetasol cream (TEMOVATE) 0.05 % Apply 1 application topically daily as needed. Skin discoloration 04/22/15   [provider]  cyclobenzaprine (FLEXERIL) 10 MG tablet TAKE 1 TABLET BY MOUTH EVERY 6 TO 8 HOURS AS NEEDED MUSCLE SPASMS 09/07/16   Kerrin Champagne, MD  cyclobenzaprine (FLEXERIL) 10 MG tablet TAKE 1 TABLET EVERY 6-8HRS AS NEEDED FOR MUSCLE SPASMS 04/18/17   Kerrin Champagne, MD  diclofenac sodium (VOLTAREN) 1 % GEL Apply 4 g topically 4 (four) times daily. 11/11/16   Panwala, Naitik, PA-C  diltiazem (CARDIZEM CD) 180 MG 24 hr capsule TAKE 1 CAPSULE BY MOUTH EVERY DAY 03/16/17   Quintella Reichert, MD  doxepin (SINEQUAN) 10 MG capsule Take 10 mg by mouth at bedtime as needed (sleep).    [provider]  EPIPEN 2-PAK 0.3 MG/0.3ML SOAJ injection Inject 0.3 mg into the skin as needed (allergic reaction).  12/30/14   [provider]  famciclovir (FAMVIR) 500 MG tablet Take 1500mg  on the onset of fever blister for 2 or 3 days as needed for fever blisters 01/21/15   [provider]  FLUoxetine (PROZAC) 10 MG capsule Take  10 mg by mouth daily.    [provider]  fluticasone (FLONASE) 50 MCG/ACT nasal spray Place 2 sprays into both nostrils as needed for allergies. 12/28/14 06/09/16  Adonis Brook, NP  folic acid (FOLVITE) 1 MG tablet Take 2 tablets (2 mg total) by mouth daily. 08/05/16 10/29/17  Tawni Pummel, PA-C  Folic Acid-Vit B6-Vit B12 (FABB) 2.2-25-1 MG TABS Take 1 tablet by mouth daily. 11/11/16   Panwala, Naitik, PA-C  gabapentin (NEURONTIN) 300 MG capsule Take 1 capsule (300 mg total) by mouth 3 (three) times daily. 04/22/15   Benjaman Pott, MD  hydrOXYzine (VISTARIL) 25 MG capsule Take 25 mg by mouth every 8 (eight) hours as needed for anxiety.  12/30/14   [provider]  levothyroxine (SYNTHROID, LEVOTHROID) 25 MCG tablet Take 1 tablet (25 mcg total) by mouth daily before breakfast. 12/28/14   Adonis Brook, NP  lidocaine (LIDODERM) 5 % Place 1 patch onto the skin daily as needed (pain). Hip 11/11/16   Panwala,  Naitik, PA-C  Methotrexate, Anti-Rheumatic, (OTREXUP [place]) Inject into the skin.    [provider]  metoprolol tartrate (LOPRESSOR) 25 MG tablet Take 1 tablet (25 mg total) by mouth 2 (two) times daily. Patient not taking: Reported on 08/05/2016 06/04/16 09/02/16  Manson Passey, PA  nitroGLYCERIN (NITROSTAT) 0.4 MG SL tablet PLACE 1 TABLET UNDER THE TONGUE EVERY 5 MINUTES AS NEEDED FOR CHEST PAIN. 09/06/16   Quintella Reichert, MD  pantoprazole (PROTONIX) 40 MG tablet TAKE 1 TABLET (40 MG TOTAL) BY MOUTH 2 (TWO) TIMES DAILY. 02/03/16   Esterwood, Amy S, PA-C  rosuvastatin (CRESTOR) 20 MG tablet Take 20 mg by mouth. 09/21/16 09/21/17  [provider]  tretinoin (RETIN-A) 0.025 % cream Apply 1 application topically daily. 02/11/15   [provider]  triamcinolone cream (KENALOG) 0.1 % Apply 1 application topically daily as needed. Itching 04/13/15   [provider]  Tuberculin-Allergy Syringes 28G X 1/2" 1 ML MISC To use with injectable methotrexate once a  week 04/28/16   Pollyann Savoy, MD  XERESE 5-1 % CREA Apply 1 application topically daily as needed (fever blisters).  12/30/14   [provider]    Family History Family History  Problem Relation Age of Onset  . Hyperlipidemia Mother   . Depression Mother   . Sarcoidosis Father   . Lung disease Father        Pleural Mesothelioma  . Cancer Father   . Heart attack Paternal Grandmother   . Hypertension Paternal Grandmother   . Hypertension Maternal Grandmother   . Diabetes Maternal Grandmother   . Stroke Neg Hx   . Colon cancer Neg Hx   . Esophageal cancer Neg Hx   . Rectal cancer Neg Hx   . Stomach cancer Neg Hx     Social History Social History   Tobacco Use  . Smoking status: Never Smoker  . Smokeless tobacco: Never Used  Substance Use Topics  . Alcohol use: No  . Drug use: No     Allergies   Influenza vaccines and Isosorbide   Review of Systems Review of Systems  All other systems reviewed and are negative.    Physical Exam Updated Vital Signs BP 119/85 (BP Location: Left Arm)   Pulse (!) 113   Temp 97.9 F (36.6 C) (Oral)   Resp 18   Ht 5\' 2"  (1.575 m)   Wt 113.4 kg (250 lb)   SpO2 100%   BMI 45.73 kg/m   Physical Exam  Constitutional: She is oriented to person, place, and time. She appears well-developed and well-nourished. No distress.  Obese female, non toxic in appearance  HENT:  Head: Atraumatic.  Ears: Normal TMs bilaterally  nose: Normal nares  throat: Uvula midline no tonsillar enlargement or exudates  Eyes: Conjunctivae and EOM are normal. Pupils are equal, round, and reactive to light.  Neck: Normal range of motion. Neck supple.  No nuchal rigidity  Cardiovascular:  Mild tachycardia without murmur rubs gallops  Pulmonary/Chest: Effort normal and breath sounds normal. No stridor. No respiratory distress. She has no wheezes. She has no rales.  Abdominal: Soft. She exhibits no distension. There is no tenderness.    Lymphadenopathy:    She has cervical adenopathy.  Neurological: She is alert and oriented to person, place, and time.  Skin: No rash noted.  Psychiatric: She has a normal mood and affect.  Nursing note and vitals reviewed.    ED Treatments / Results  Labs (all labs ordered are listed,  but only abnormal results are displayed) Labs Reviewed - No data to display  EKG  EKG Interpretation None       Radiology No results found.  Procedures Procedures (including critical care time)  Medications Ordered in ED Medications - No data to display   Initial Impression / Assessment and Plan / ED Course  I have reviewed the triage vital signs and the nursing notes.  Pertinent labs & imaging results that were available during my care of the patient were reviewed by me and considered in my medical decision making (see chart for details).     BP 119/85 (BP Location: Left Arm)   Pulse (!) 113   Temp 97.9 F (36.6 C) (Oral)   Resp 18   Ht 5\' 2"  (1.575 m)   Wt 113.4 kg (250 lb)   SpO2 100%   BMI 45.73 kg/m    Final Clinical Impressions(s) / ED Diagnoses   Final diagnoses:  Viral syndrome    ED Discharge Orders        Ordered    promethazine-dextromethorphan (PROMETHAZINE-DM) 6.25-15 MG/5ML syrup  4 times daily PRN     05/13/17 2257     10:54 PM Patient here with cold symptoms.  She does have some mild sinus tenderness on percussion but otherwise her exam is unremarkable.  Symptoms ongoing for 2 days.  Likely viral in etiology.  Will provide symptomatically treatment.  No suspicion for bacterial sinusitis.   14/07/18, PA-C 05/13/17 2306    2307, MD 05/13/17 [id number]

## 2017-05-17 ENCOUNTER — Other Ambulatory Visit: Payer: Self-pay | Admitting: Cardiology

## 2017-05-27 ENCOUNTER — Telehealth: Payer: Self-pay | Admitting: Rheumatology

## 2017-05-27 NOTE — Telephone Encounter (Signed)
Called [employer] to check the status of patients pre-certification for Orencia IV. Spoke with [place] who states that the authorization has been initiated and is needing clinical documentation. [facility] is in network and no authorization is required but a pre-determination is highly suggested.  Office notes from 11/11/2016 has been faxed over with reference number: [id number]. Will update once we receive a response.   Phone: [phone] Fax: [phone]  Call reference: [id number]  Abran Duke, CPhT 11:07 AM

## 2017-05-27 NOTE — Telephone Encounter (Signed)
[name] from [facility] was calling for PA for infusion for patient.  Call back #[phone]   Reference #[id number]

## 2017-05-27 NOTE — Telephone Encounter (Signed)
Returned call to [facility] at [facility]. Had to leave a message. Will update once we receive a response.    Andray Assefa, [place], CPhT 10:18 AM

## 2017-06-02 ENCOUNTER — Other Ambulatory Visit: Payer: Self-pay

## 2017-06-02 MED ORDER — PREDNISONE 5 MG PO TABS: ORAL_TABLET | ORAL | 0 refills | Status: DC

## 2017-06-02 NOTE — Telephone Encounter (Signed)
Prednisone taper sent to the pharmacy and patient notified.

## 2017-06-02 NOTE — Telephone Encounter (Signed)
Called [employer] to check the status of patients pre-certification. Spoke with [name] who states that the clinical documents have been received and the authorization is being reviewed. There is a turnaround time of 15 days. Called the case manager, [name] to see if we could get the authorization expedited because patient is scheduled for her next infusion on 06/08/16. Had to leave a message. Will update once we receive a response.   Phone: [phone] Reference number: [id number]  Called patient to update. Will call patient at the end of the week to give an update and have her reschedule if necessary. Patient voice understanding and denies any questions at this time.   [name] [name], [place], CPhT 11:29 AM

## 2017-06-02 NOTE — Telephone Encounter (Signed)
Ok to give Pred taper 20 mg po qd and taper by 5 mg every 4 days.

## 2017-06-02 NOTE — Telephone Encounter (Signed)
Spoke with patient. She states that she is currently having a really bad flare in her elbows, fingers, knee, hip and ankles. She is requesting a prednisone taper. Please advise. Thanks!  Gorgeous [place], [place], CPhT 11:30 AM

## 2017-06-02 NOTE — Telephone Encounter (Signed)
Patient states she is experiencing a flare that started yesterday in her elbows, fingers, knees, hips and ankles. Patient states the onset was all at once. Patient is on Orenica IV, MTX and gabapentin. Patient is requesting a prednisone taper.

## 2017-06-03 ENCOUNTER — Telehealth: Payer: Self-pay

## 2017-06-03 NOTE — Telephone Encounter (Signed)
Called [employer] to get an expedited review for patients pre-certification for Orencia IV. Spoke with Cordelia Pen who was able to process the authorization. Patient has been approved from 06/03/2017 through 06/02/2018. Patient may have as many treatments and as many changed in does as necessary. An approval letter will be mailed to the clinic.   Will send document to scan center once received  Reference number: [id number] Phone: [phone]  Called patient to update. She did not answer and her mailbox was full.   Takeya Marquis, [place], CPhT 3:17 PM

## 2017-06-03 NOTE — Telephone Encounter (Signed)
Error

## 2017-06-03 NOTE — Telephone Encounter (Signed)
Patient returned call. She voices understanding and denies any questions at this time.   Baltazar Pekala, [place], CPhT 3:31 PM

## 2017-06-08 ENCOUNTER — Encounter (HOSPITAL_COMMUNITY)
Admission: RE | Admit: 2017-06-08 | Discharge: 2017-06-08 | Disposition: A | Discharge status: Home / Self Care | Payer: BLUE CROSS/BLUE SHIELD | Source: Ambulatory Visit | Attending: Rheumatology | Admitting: Rheumatology

## 2017-06-08 DIAGNOSIS — M0579 Rheumatoid arthritis with rheumatoid factor of multiple sites without organ or systems involvement: Secondary | ICD-10-CM | POA: Diagnosis present

## 2017-06-08 MED ORDER — DIPHENHYDRAMINE HCL 25 MG PO CAPS
25.0000 mg | ORAL_CAPSULE | ORAL | Status: DC
  Administered 2017-06-08: 12:00:00 25 mg via ORAL

## 2017-06-08 MED ORDER — SODIUM CHLORIDE 0.9 % IV SOLN
1000.0000 mg | INTRAVENOUS | Status: DC
  Administered 2017-06-08: 1000 mg via INTRAVENOUS
  Filled 2017-06-08: qty 40

## 2017-06-08 MED ORDER — ACETAMINOPHEN 325 MG PO TABS
ORAL_TABLET | ORAL | Status: AC
  Filled 2017-06-08: qty 2

## 2017-06-08 MED ORDER — DIPHENHYDRAMINE HCL 25 MG PO CAPS
ORAL_CAPSULE | ORAL | Status: AC
  Filled 2017-06-08: qty 1

## 2017-06-08 MED ORDER — ACETAMINOPHEN 325 MG PO TABS
650.0000 mg | ORAL_TABLET | ORAL | Status: DC
  Administered 2017-06-08: 12:00:00 650 mg via ORAL

## 2017-06-11 ENCOUNTER — Other Ambulatory Visit: Payer: Self-pay | Admitting: Cardiology

## 2017-07-01 ENCOUNTER — Other Ambulatory Visit: Payer: Self-pay | Admitting: *Deleted

## 2017-07-01 NOTE — Progress Notes (Signed)
Infusion orders are current for patient CBC CMP Tylenol Benadryl appointments are up to date and follow up appointment  is scheduled TB gold not due yet.  

## 2017-07-02 ENCOUNTER — Other Ambulatory Visit: Payer: Self-pay | Admitting: Rheumatology

## 2017-07-04 NOTE — Telephone Encounter (Addendum)
Last Visit: 03/10/17 Next Visit: 08/19/17  Okay to refill per Dr. Corliss Skains

## 2017-07-05 ENCOUNTER — Other Ambulatory Visit (HOSPITAL_COMMUNITY): Payer: Self-pay | Admitting: *Deleted

## 2017-07-06 ENCOUNTER — Encounter (HOSPITAL_COMMUNITY)
Admission: RE | Admit: 2017-07-06 | Discharge: 2017-07-06 | Disposition: A | Discharge status: Home / Self Care | Payer: BLUE CROSS/BLUE SHIELD | Source: Ambulatory Visit | Attending: Rheumatology | Admitting: Rheumatology

## 2017-07-06 ENCOUNTER — Encounter (HOSPITAL_COMMUNITY): Payer: Self-pay

## 2017-07-06 DIAGNOSIS — M0579 Rheumatoid arthritis with rheumatoid factor of multiple sites without organ or systems involvement: Secondary | ICD-10-CM | POA: Insufficient documentation

## 2017-07-06 LAB — CBC
HEMATOCRIT: 41.3 % (ref 36.0–46.0)
Hemoglobin: 13.4 g/dL (ref 12.0–15.0)
MCH: 27.9 pg (ref 26.0–34.0)
MCHC: 32.4 g/dL (ref 30.0–36.0)
MCV: 85.9 fL (ref 78.0–100.0)
Platelets: 256 10*3/uL (ref 150–400)
RBC: 4.81 MIL/uL (ref 3.87–5.11)
RDW: 13.5 % (ref 11.5–15.5)
WBC: 11.5 10*3/uL — AB (ref 4.0–10.5)

## 2017-07-06 LAB — COMPREHENSIVE METABOLIC PANEL
ALT: 16 U/L (ref 14–54)
AST: 21 U/L (ref 15–41)
Albumin: 3 g/dL — ABNORMAL LOW (ref 3.5–5.0)
Alkaline Phosphatase: 59 U/L (ref 38–126)
Anion gap: 9 (ref 5–15)
BILIRUBIN TOTAL: 0.5 mg/dL (ref 0.3–1.2)
BUN: 9 mg/dL (ref 6–20)
CHLORIDE: 108 mmol/L (ref 101–111)
CO2: 24 mmol/L (ref 22–32)
CREATININE: 0.87 mg/dL (ref 0.44–1.00)
Calcium: 8.9 mg/dL (ref 8.9–10.3)
GFR calc Af Amer: >60 mL/min (ref >60)
Glucose, Bld: 93 mg/dL (ref 65–99)
Potassium: 4.1 mmol/L (ref 3.5–5.1)
Sodium: 141 mmol/L (ref 135–145)
TOTAL PROTEIN: 6.3 g/dL — AB (ref 6.5–8.1)

## 2017-07-06 MED ORDER — DIPHENHYDRAMINE HCL 25 MG PO CAPS
ORAL_CAPSULE | ORAL | Status: AC
  Filled 2017-07-06: qty 1

## 2017-07-06 MED ORDER — DIPHENHYDRAMINE HCL 25 MG PO CAPS
25.0000 mg | ORAL_CAPSULE | ORAL | Status: DC
  Administered 2017-07-06: 25 mg via ORAL

## 2017-07-06 MED ORDER — ACETAMINOPHEN 325 MG PO TABS
650.0000 mg | ORAL_TABLET | ORAL | Status: DC
  Administered 2017-07-06: 650 mg via ORAL

## 2017-07-06 MED ORDER — SODIUM CHLORIDE 0.9 % IV SOLN
INTRAVENOUS | Status: DC
  Administered 2017-07-06: 09:00:00 via INTRAVENOUS

## 2017-07-06 MED ORDER — ACETAMINOPHEN 325 MG PO TABS
ORAL_TABLET | ORAL | Status: AC
  Filled 2017-07-06: qty 2

## 2017-07-06 MED ORDER — SODIUM CHLORIDE 0.9 % IV SOLN
1000.0000 mg | INTRAVENOUS | Status: DC
  Administered 2017-07-06: 09:00:00 1000 mg via INTRAVENOUS
  Filled 2017-07-06: qty 40

## 2017-07-06 NOTE — Progress Notes (Signed)
Labs are stable.

## 2017-07-08 ENCOUNTER — Encounter (HOSPITAL_COMMUNITY): Payer: Self-pay

## 2017-07-15 ENCOUNTER — Other Ambulatory Visit: Payer: Self-pay | Admitting: *Deleted

## 2017-07-15 NOTE — Progress Notes (Signed)
Infusion orders are current for patient CBC CMP Tylenol Benadryl appointments are up to date and follow up appointment  is scheduled TB gold not due yet.  

## 2017-07-18 ENCOUNTER — Other Ambulatory Visit: Payer: Self-pay

## 2017-07-18 ENCOUNTER — Other Ambulatory Visit: Payer: Self-pay | Admitting: Rheumatology

## 2017-07-18 MED ORDER — GABAPENTIN 100 MG PO CAPS: 100.0000 mg | ORAL_CAPSULE | Freq: Three times a day (TID) | ORAL | 0 refills | Status: DC

## 2017-07-18 MED ORDER — DICLOFENAC SODIUM 1 % TD GEL: TRANSDERMAL | 3 refills | Status: DC

## 2017-07-18 MED ORDER — GABAPENTIN 300 MG PO CAPS: 300.0000 mg | ORAL_CAPSULE | Freq: Three times a day (TID) | ORAL | 0 refills | Status: DC

## 2017-07-18 NOTE — Telephone Encounter (Signed)
Last visit: 03/10/2017 Next visit: 08/19/2017  Okay to refill voltaren gel.   Okay to refill gabapentin?   Please see message below about switching from MTX to Otrexup.

## 2017-07-18 NOTE — Telephone Encounter (Signed)
Patient request refill on Gabapentin, and MTX, and Voltaren Gel. [facility] [address] Patient would like to discuss switching fron MTX to Otrexup perfilled syringes. Patient has changed insurance to [employer]. MTX syringes are not covered w/ insurance.

## 2017-07-18 NOTE — Telephone Encounter (Signed)
Ok to refill Gabapentin and Voltaren gel.    Chasta, please apply for Otrexup and contact patient regarding insurance change.

## 2017-07-20 NOTE — Telephone Encounter (Signed)
Returned pts call to see what her new insurance plan was. She states that her plan has not changed. She was sitting in a doctors office at the time and requested that she call me back.   Will address once pt returns call.  Shermaine Rivet, [place], CPhT 8:45 AM

## 2017-07-21 ENCOUNTER — Telehealth: Payer: Self-pay

## 2017-07-21 NOTE — Telephone Encounter (Signed)
We can change to Rasuvo.

## 2017-07-21 NOTE — Telephone Encounter (Signed)
Patient called stating that a prior authorization was needed for OTREXUP with her new insurance.   Submitted a new PA to pts insurance via CoverMyMeds.The preferred injectable mtx for pts plans is Rasuvo. Can we change to Rasuvo or continue with Otrexup?   Amberli Ruegg, [place], CPhT 1:21 PM

## 2017-07-21 NOTE — Telephone Encounter (Signed)
Authorization for Rasuvo 25mg  has been submitted to pt insurance via Cover My Meds.  Will update once we receive a response.   Jahkai Yandell, [place], CPhT 3:07 PM

## 2017-07-22 MED ORDER — METHOTREXATE (PF) 25 MG/0.5ML ~~LOC~~ SOAJ: 25.0000 mg | SUBCUTANEOUS | 0 refills | Status: DC

## 2017-07-22 NOTE — Addendum Note (Signed)
Addended by: Henriette Combs on: 07/22/2017 03:49 PM   Modules accepted: Orders

## 2017-07-22 NOTE — Telephone Encounter (Signed)
Received a fax from [facility] regarding a prior authorization approval for RASUVO from 07/22/2017 to 2//15/2021.   Reference number: [id number] Phone number: NONE  Will send document to scan center.  Called pt to update. [facility] [address] Patient voices understanding and denies any questions at this time.  Please send new Rx into Pharmacy. Thanks!  Snigdha Howser, [place], CPhT 3:25 PM

## 2017-07-22 NOTE — Telephone Encounter (Signed)
Prescription sent to the pharmacy.

## 2017-07-27 HISTORY — PX: COLONOSCOPY: SHX174

## 2017-08-02 ENCOUNTER — Other Ambulatory Visit (HOSPITAL_COMMUNITY): Payer: Self-pay | Admitting: *Deleted

## 2017-08-03 ENCOUNTER — Encounter (HOSPITAL_COMMUNITY)
Admission: RE | Admit: 2017-08-03 | Discharge: 2017-08-03 | Disposition: A | Discharge status: Home / Self Care | Payer: BLUE CROSS/BLUE SHIELD | Source: Ambulatory Visit | Attending: Rheumatology | Admitting: Rheumatology

## 2017-08-03 DIAGNOSIS — M0579 Rheumatoid arthritis with rheumatoid factor of multiple sites without organ or systems involvement: Secondary | ICD-10-CM | POA: Diagnosis present

## 2017-08-03 MED ORDER — SODIUM CHLORIDE 0.9 % IV SOLN
1000.0000 mg | INTRAVENOUS | Status: DC
  Administered 2017-08-03: 1000 mg via INTRAVENOUS
  Filled 2017-08-03: qty 40

## 2017-08-03 MED ORDER — DIPHENHYDRAMINE HCL 25 MG PO CAPS
25.0000 mg | ORAL_CAPSULE | ORAL | Status: DC
  Administered 2017-08-03: 25 mg via ORAL

## 2017-08-03 MED ORDER — SODIUM CHLORIDE 0.9 % IV SOLN
INTRAVENOUS | Status: DC
  Administered 2017-08-03: 12:00:00 via INTRAVENOUS

## 2017-08-03 MED ORDER — ACETAMINOPHEN 325 MG PO TABS
ORAL_TABLET | ORAL | Status: AC
  Administered 2017-08-03: 650 mg via ORAL
  Filled 2017-08-03: qty 2

## 2017-08-03 MED ORDER — ACETAMINOPHEN 325 MG PO TABS
650.0000 mg | ORAL_TABLET | ORAL | Status: DC
  Administered 2017-08-03: 650 mg via ORAL

## 2017-08-03 MED ORDER — DIPHENHYDRAMINE HCL 25 MG PO CAPS
ORAL_CAPSULE | ORAL | Status: AC
  Administered 2017-08-03: 12:00:00 25 mg via ORAL
  Filled 2017-08-03: qty 1

## 2017-08-05 NOTE — Progress Notes (Signed)
Office Visit Note  Patient: [name] [name]             Date of Birth: 1965-06-23           MRN: [id number]             PCP: [name], MD Referring: [name], MD Visit Date: 08/19/2017 Occupation: @GUAROCC @    Subjective:  Left hip pain   History of Present Illness: [name] [name] is a 52 y.o. female with history of seropositive rheumatoid arthritis, osteoarthritis and fibromyalgia syndrome.  She states she has been having a lot of pain and discomfort over her left trochanteric bursa.  She states she is having difficulty walking and severe pain in the left trochanter.  She continues to have some discomfort in her right trochanter as well.  Her rheumatoid arthritis seems to be quite well controlled on current combination of medications.  She is having a lot of problems with fibromyalgia flare with generalized pain and fatigue.  Activities of Daily Living:  Patient reports morning stiffness for 1 hour.   Patient Reports nocturnal pain.  Difficulty dressing/grooming: Denies Difficulty climbing stairs: Reports Difficulty getting out of chair: Reports Difficulty using hands for taps, buttons, cutlery, and/or writing: Denies   Review of Systems  Constitutional: Positive for fatigue. Negative for night sweats, weight gain, weight loss and weakness.  HENT: Positive for mouth dryness. Negative for mouth sores, trouble swallowing, trouble swallowing and nose dryness.   Eyes: Positive for dryness. Negative for pain, redness and visual disturbance.  Respiratory: Negative for cough, shortness of breath and difficulty breathing.   Cardiovascular: Positive for hypertension. Negative for chest pain, palpitations, irregular heartbeat and swelling in legs/feet.  Gastrointestinal: Negative for blood in stool, constipation and diarrhea.  Endocrine: Negative for increased urination.  Genitourinary: Negative for vaginal dryness.  Musculoskeletal: Positive for arthralgias,  joint pain and morning stiffness. Negative for joint swelling, myalgias, muscle weakness, muscle tenderness and myalgias.  Skin: Negative for color change, rash, hair loss, skin tightness, ulcers and sensitivity to sunlight.  Allergic/Immunologic: Negative for susceptible to infections.  Neurological: Negative for dizziness, memory loss and night sweats.  Hematological: Negative for swollen glands.  Psychiatric/Behavioral: Positive for depressed mood and sleep disturbance. The patient is nervous/anxious.     PMFS History:  Patient Active Problem List   Diagnosis Date Noted  . High risk medication use 08/03/2016  . Fibromyalgia syndrome 08/03/2016  . Abnormal cardiac CT angiography   . Sacrococcygeal pain 02/20/2016  . Rheumatoid arthritis involving 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HCC) 01/24/2016  . 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 Dyslipidemia 01/30/2015  . 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 Essential hypertension 08/25/2014  . Morbid obesity (HCC) 08/25/2014  . CAD (coronary artery disease), native coronary artery 08/24/2014  . Numbness and tingling in left arm   . Arm numbness left 08/22/2014  . Chest pain 08/22/2014  . Gastroesophageal reflux disease without esophagitis 07/23/2014  . Coronary artery calcification seen on CAT scan 02/19/2014  . Hip pain 10/23/2013  . Solitary pulmonary nodule 08/28/2013  . Chest pain, atypical 07/18/2013  . Heart palpitations 06/27/2013  . Cough 06/04/2013  . Diastolic dysfunction 05/18/2013  . SOB (shortness of breath) 05/09/2013  . Other malaise and fatigue 01/21/2013  . Stress and adjustment reaction 12/09/2012  . Right sided abdominal pain 11/29/2012  . 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/21/2012  . Morbid obesity with BMI of 50.0-59.9, adult (HCC) 11/21/2012  .  Arthritis 07/13/2012  . Depression 07/13/2012  . Routine health maintenance 05/07/2012    Past Medical History:    Diagnosis Date  . Anxiety   . Coronary artery calcification seen on CAT scan    minimal CAD with 30% prox and mild LAD  . Depression   . Diastolic dysfunction   . Dyslipidemia 01/30/2015  . Esophageal ring   . Fibromyalgia   . Hiatal hernia   . HSV-1 infection   . Morbid obesity (HCC)   . Rheumatoid arthritis(714.0)    M05.79  . Rheumatoid arthritis, seropositive, 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HCC)    Treated with Orencia, TB neg 09/12/2015    Family History  Problem Relation Age of Onset  . Hyperlipidemia Mother   . Depression Mother   . Sarcoidosis Father   . Lung disease Father        Pleural Mesothelioma  . Cancer Father   . Heart attack Paternal Grandmother   . Hypertension Paternal Grandmother   . Hypertension Maternal Grandmother   . Diabetes Maternal Grandmother   . Stroke Neg Hx   . Colon cancer Neg Hx   . Esophageal cancer Neg Hx   . Rectal cancer Neg Hx   . Stomach cancer Neg Hx    Past Surgical History:  Procedure Laterality Date  . CARDIAC CATHETERIZATION N/A 06/11/2016   Procedure: Left Heart Cath and Coronary Angiography;  Surgeon: Corky Crafts, MD;  Location: [facility] INVASIVE CV LAB;  Service: Cardiovascular;  Laterality: N/A;  . CESAREAN SECTION    . COLONOSCOPY  07/27/2017  . ESOPHAGEAL MANOMETRY N/A 10/28/2014   Procedure: ESOPHAGEAL MANOMETRY (EM);  Surgeon: Hilarie Fredrickson, MD;  Location: [facility] ENDOSCOPY;  Service: Endoscopy;  Laterality: N/A;  . HERNIA REPAIR     reports surgery on 3 hernias, with 2 more present  . LAPAROSCOPIC GASTRIC SLEEVE RESECTION  11/21/12   [facility]  . LEFT HEART CATHETERIZATION WITH CORONARY ANGIOGRAM N/A 08/26/2014   Procedure: LEFT HEART CATHETERIZATION WITH CORONARY ANGIOGRAM;  Surgeon: Runell Gess, MD;  Location: [facility] CATH LAB;  Service: Cardiovascular;  Laterality: N/A;  . TUBAL LIGATION     Social History   Social History Narrative   Marital Status: Married Probation officer)    Children:  Son [name]) Daughter [name])     Pets: None    Living Situation: Lives with husband and children    Occupation: Occupational psychologist Administrator)     Education: Oncologist (Psychology)     Tobacco Use/Exposure:  None    Alcohol Use:  Occasional   Drug Use:  None   Diet:  Regular   Exercise: Walking or Treadmill (2 x week)   Hobbies: Clinical cytogeneticist, [employer].                 Objective: Vital Signs: BP 117/83 (BP Location: Left Arm, Patient Position: Sitting, Cuff Size: Normal)   Pulse 78   Resp 19   Ht 5\' 2"  (1.575 m)   Wt 267 lb (121.1 kg)   BMI 48.83 kg/m    Physical Exam  Constitutional: She is oriented to person, place, and time. She appears well-developed and well-nourished.  HENT:  Head: Normocephalic and atraumatic.  Eyes: Conjunctivae and EOM are normal.  Neck: Normal range of motion.  Cardiovascular: Normal rate, regular rhythm, normal heart sounds and intact distal pulses.  Pulmonary/Chest: Effort normal and breath sounds normal.  Abdominal: Soft. Bowel sounds are normal.  Lymphadenopathy:    She has no cervical adenopathy.  Neurological: She is alert and oriented to person, place, and time.  Skin: Skin is warm and dry. Capillary refill takes less than 2 seconds.  Psychiatric: She has a normal mood and affect. Her behavior is normal.  Nursing note and vitals reviewed.    Musculoskeletal Exam: C-spine thoracic lumbar spine good range of motion.  Shoulder joints elbow joints wrist joint MCPs PIPs DIPs with good range of motion with no synovitis.  Hip joints knee joints ankles MTPs PIPs DIPs are good range of motion with no synovitis.  She had tenderness on palpation over bilateral trochanteric bursa more so on the left than the right side.  She is generalized hyperalgesia.  CDAI Exam: CDAI Homunculus Exam:   Joint Counts:  CDAI Tender Joint count: 0 CDAI Swollen Joint count: 0  Global Assessments:  Patient Global Assessment: 4 Provider Global Assessment: 2  CDAI  Calculated Score: 6    Investigation: No additional findings.TB Gold: 09/01/2016 Negative  CBC Latest Ref Rng & Units 07/06/2017 05/02/2017 02/17/2017  WBC 4.0 - 10.5 K/uL 11.5(H) 8.6 11.2(H)  Hemoglobin 12.0 - 15.0 g/dL 37.8 58.8 50.2  Hematocrit 36.0 - 46.0 % 41.3 40.8 41.2  Platelets 150 - 400 K/uL 256 262 283   CMP Latest Ref Rng & Units 07/06/2017 05/02/2017 02/17/2017  Glucose 65 - 99 mg/dL 93 81 82  BUN 6 - 20 mg/dL 9 10 10   Creatinine 0.44 - 1.00 mg/dL 7.74 1.28 7.86  Sodium 135 - 145 mmol/L 141 140 140  Potassium 3.5 - 5.1 mmol/L 4.1 3.4(L) 4.1  Chloride 101 - 111 mmol/L 108 108 107  CO2 22 - 32 mmol/L 24 27 27   Calcium 8.9 - 10.3 mg/dL 8.9 7.6(H) 2.0(N)  Total Protein 6.5 - 8.1 g/dL 6.3(L) 6.3(L) 6.1(L)  Total Bilirubin 0.3 - 1.2 mg/dL 0.5 0.3 0.5  Alkaline Phos 38 - 126 U/L 59 71 65  AST 15 - 41 U/L 21 26 21   ALT 14 - 54 U/L 16 20 16     Imaging: No results found.  Speciality Comments: ORENCIA 1000 mg x 4 weeks TB GOLD negative 09/01/16    Procedures:  Large Joint Inj: L greater trochanter on 08/19/2017 11:36 AM Indications: pain Details: 27 G 1.5 in needle, lateral approach  Arthrogram: No  Medications: 1.5 mL lidocaine (PF) 1 %; 60 mg triamcinolone acetonide 40 MG/ML Aspirate: 0 mL Outcome: tolerated well, no immediate complications Procedure, treatment alternatives, risks and benefits explained, specific risks discussed. Consent was given by the patient. Immediately prior to procedure a time out was called to verify the correct patient, procedure, equipment, support staff and site/side marked as required. Patient was prepped and draped in the usual sterile fashion.     Allergies: Influenza vaccines and Isosorbide   Assessment / Plan:     Visit Diagnoses: Rheumatoid arthritis involving multiple sites with positive rheumatoid factor (HCC) lysed pain discomfort and positive tender points but she does not have any synovitis on examination her rheumatoid  arthritis seems to be quite well controlled.  High risk medication use - Orencia IV, MTX 1 mL subcu weekly, folic acid 2mg  po qd.  Her labs have been stable.  She will continue to get labs with her infusions.  Trochanteric bursitis of both hips: She has pain and discomfort over bilateral trochanteric bursa especially her left trochanteric bursa.  After informed consent was obtained left trochanteric bursa was injected with cortisone as described above.  Primary osteoarthritis of both knees: Chronic pain  Fibromyalgia: She has  generalized pain and discomfort and hyperalgesia.  Other fatigue: Related to insomnia.  Other insomnia: Good sleep hygiene was discussed.  S/P laparoscopic sleeve gastrectomy  History of vitamin D deficiency and she is on supplement.  History of depression  History of diastolic dysfunction  Dyslipidemia  Solitary pulmonary nodule  History of hypertension  History of coronary artery disease   Association of heart disease with rheumatoid arthritis was discussed. Need to monitor blood pressure, cholesterol, and to exercise 30-60 minutes on daily basis was discussed. Poor dental hygiene can be a predisposing factor for rheumatoid arthritis. Good dental hygiene was discussed.  Orders: No orders of the defined types were placed in this encounter.  No orders of the defined types were placed in this encounter.   Face-to-face time spent with patient was 30 minutes.  Greater than 50% of time was spent in counseling and coordination of care.  Follow-Up Instructions: Return in about 5 months (around 01/19/2018) for Rheumatoid arthritis,OA, FMS.   Pollyann Savoy, MD  Note - This record has been created using Animal nutritionist.  Chart creation errors have been sought, but may not always  have been located. Such creation errors do not reflect on  the standard of medical care.

## 2017-08-10 ENCOUNTER — Other Ambulatory Visit: Payer: Self-pay | Admitting: *Deleted

## 2017-08-10 NOTE — Progress Notes (Signed)
Infusion orders are current for patient CBC CMP Tylenol Benadryl appointments are up to date and follow up appointment  is scheduled TB gold due and order placed.  

## 2017-08-17 ENCOUNTER — Other Ambulatory Visit: Payer: Self-pay | Admitting: Physician Assistant

## 2017-08-17 NOTE — Telephone Encounter (Signed)
Last visit: 03/10/2017 Next visit: 08/19/2017  Okay to refill per Dr. Corliss Skains

## 2017-08-19 ENCOUNTER — Ambulatory Visit (INDEPENDENT_AMBULATORY_CARE_PROVIDER_SITE_OTHER): Payer: BLUE CROSS/BLUE SHIELD | Admitting: Rheumatology

## 2017-08-19 ENCOUNTER — Encounter: Payer: Self-pay | Admitting: Rheumatology

## 2017-08-19 VITALS — BP 117/83 | HR 78 | Resp 19 | Ht 62.0 in | Wt 267.0 lb

## 2017-08-19 DIAGNOSIS — Z8679 Personal history of other diseases of the circulatory system: Secondary | ICD-10-CM

## 2017-08-19 DIAGNOSIS — M0579 Rheumatoid arthritis with rheumatoid factor of multiple sites without organ or systems involvement: Secondary | ICD-10-CM | POA: Diagnosis not present

## 2017-08-19 DIAGNOSIS — Z8659 Personal history of other mental and behavioral disorders: Secondary | ICD-10-CM

## 2017-08-19 DIAGNOSIS — Z9884 Bariatric surgery status: Secondary | ICD-10-CM

## 2017-08-19 DIAGNOSIS — M7061 Trochanteric bursitis, right hip: Secondary | ICD-10-CM

## 2017-08-19 DIAGNOSIS — M797 Fibromyalgia: Secondary | ICD-10-CM | POA: Diagnosis not present

## 2017-08-19 DIAGNOSIS — R911 Solitary pulmonary nodule: Secondary | ICD-10-CM

## 2017-08-19 DIAGNOSIS — G4709 Other insomnia: Secondary | ICD-10-CM

## 2017-08-19 DIAGNOSIS — E785 Hyperlipidemia, unspecified: Secondary | ICD-10-CM | POA: Diagnosis not present

## 2017-08-19 DIAGNOSIS — R5383 Other fatigue: Secondary | ICD-10-CM | POA: Diagnosis not present

## 2017-08-19 DIAGNOSIS — Z79899 Other long term (current) drug therapy: Secondary | ICD-10-CM

## 2017-08-19 DIAGNOSIS — M170 Bilateral primary osteoarthritis of knee: Secondary | ICD-10-CM | POA: Diagnosis not present

## 2017-08-19 DIAGNOSIS — Z8639 Personal history of other endocrine, nutritional and metabolic disease: Secondary | ICD-10-CM | POA: Diagnosis not present

## 2017-08-19 DIAGNOSIS — M7062 Trochanteric bursitis, left hip: Secondary | ICD-10-CM

## 2017-08-19 MED ORDER — LIDOCAINE HCL (PF) 1 % IJ SOLN
1.5000 mL | INTRAMUSCULAR | Status: AC | PRN
  Administered 2017-08-19: 1.5 mL

## 2017-08-19 MED ORDER — TRIAMCINOLONE ACETONIDE 40 MG/ML IJ SUSP
60.0000 mg | INTRAMUSCULAR | Status: AC | PRN
  Administered 2017-08-19: 60 mg via INTRA_ARTICULAR

## 2017-08-19 NOTE — Patient Instructions (Signed)
Iliotibial Band Syndrome Rehab  Ask your health care provider which exercises are safe for you. Do exercises exactly as told by your health care provider and adjust them as directed. It is normal to feel mild stretching, pulling, tightness, or discomfort as you do these exercises, but you should stop right away if you feel sudden pain or your pain gets worse. Do not begin these exercises until told by your health care provider.  Stretching and range of motion exercises  These exercises warm up your muscles and joints and improve the movement and flexibility of your hip and pelvis.  Exercise A: Quadriceps, prone    1. Lie on your abdomen on a firm surface, such as a bed or padded floor.  2. Bend your left / right knee and hold your ankle. If you cannot reach your ankle or pant leg, loop a belt around your foot and grab the belt instead.  3. Gently pull your heel toward your buttocks. Your knee should not slide out to the side. You should feel a stretch in the front of your thigh and knee.  4. Hold this position for __________ seconds.  Repeat __________ times. Complete this stretch __________ times a day.  Exercise B: Iliotibial band    1. Lie on your side with your left / right leg in the top position.  2. Bend both of your knees and grab your left / right ankle. Stretch out your bottom arm to help you balance.  3. Slowly bring your top knee back so your thigh goes behind your trunk.  4. Slowly lower your top leg toward the floor until you feel a gentle stretch on the outside of your left / right hip and thigh. If you do not feel a stretch and your knee will not fall farther, place the heel of your other foot on top of your knee and pull your knee down toward the floor with your foot.  5. Hold this position for __________ seconds.  Repeat __________ times. Complete this stretch __________ times a day.  Strengthening exercises  These exercises build strength and endurance in your hip and pelvis. Endurance is the  ability to use your muscles for a long time, even after they get tired.  Exercise C: Straight leg raises (  hip abductors)  1. Lie on your side with your left / right leg in the top position. Lie so your head, shoulder, knee, and hip line up. You may bend your bottom knee to help you balance.  2. Roll your hips slightly forward so your hips are stacked directly over each other and your left / right knee is facing forward.  3. Tense the muscles in your outer thigh and lift your top leg 4-6 inches (10-15 cm).  4. Hold this position for __________ seconds.  5. Slowly return to the starting position. Let your muscles relax completely before doing another repetition.  Repeat __________ times. Complete this exercise __________ times a day.  Exercise D: Straight leg raises (  hip extensors)  1. Lie on your abdomen on your bed or a firm surface. You can put a pillow under your hips if that is more comfortable.  2. Bend your left / right knee so your foot is straight up in the air.  3. Squeeze your buttock muscles and lift your left / right thigh off the bed. Do not let your back arch.  4. Tense this muscle as hard as you can without increasing any knee pain.    5. Hold this position for __________ seconds.  6. Slowly lower your leg to the starting position and allow it to relax completely.  Repeat __________ times. Complete this exercise __________ times a day.  Exercise E: Hip hike  1. Stand sideways on a bottom step. Stand on your left / right leg with your other foot unsupported next to the step. You can hold onto the railing or wall if needed for balance.  2. Keep your knees straight and your torso square. Then, lift your left / right hip up toward the ceiling.  3. Slowly let your left / right hip lower toward the floor, past the starting position. Your foot should get closer to the floor. Do not lean or bend your knees.  Repeat __________ times. Complete this exercise __________ times a day.  This information is not  intended to replace advice given to you by your health care provider. Make sure you discuss any questions you have with your health care provider.  Document Released: 05/24/2005 Document Revised: 01/27/2016 Document Reviewed: 04/25/2015  [employer]vier Interactive Patient Education © 2018 [employer]

## 2017-08-21 ENCOUNTER — Emergency Department (HOSPITAL_BASED_OUTPATIENT_CLINIC_OR_DEPARTMENT_OTHER)
Admission: EM | Admit: 2017-08-21 | Discharge: 2017-08-21 | Disposition: A | Discharge status: Home / Self Care | Payer: BLUE CROSS/BLUE SHIELD | Attending: Emergency Medicine | Admitting: Emergency Medicine

## 2017-08-21 ENCOUNTER — Other Ambulatory Visit: Payer: Self-pay

## 2017-08-21 ENCOUNTER — Emergency Department (HOSPITAL_BASED_OUTPATIENT_CLINIC_OR_DEPARTMENT_OTHER): Payer: BLUE CROSS/BLUE SHIELD

## 2017-08-21 ENCOUNTER — Encounter (HOSPITAL_BASED_OUTPATIENT_CLINIC_OR_DEPARTMENT_OTHER): Payer: Self-pay | Admitting: *Deleted

## 2017-08-21 DIAGNOSIS — Z79899 Other long term (current) drug therapy: Secondary | ICD-10-CM | POA: Insufficient documentation

## 2017-08-21 DIAGNOSIS — Z7982 Long term (current) use of aspirin: Secondary | ICD-10-CM | POA: Insufficient documentation

## 2017-08-21 DIAGNOSIS — I10 Essential (primary) hypertension: Secondary | ICD-10-CM | POA: Insufficient documentation

## 2017-08-21 DIAGNOSIS — M5432 Sciatica, left side: Secondary | ICD-10-CM | POA: Diagnosis not present

## 2017-08-21 DIAGNOSIS — I2510 Atherosclerotic heart disease of native coronary artery without angina pectoris: Secondary | ICD-10-CM | POA: Insufficient documentation

## 2017-08-21 DIAGNOSIS — M79605 Pain in left leg: Secondary | ICD-10-CM | POA: Diagnosis present

## 2017-08-21 MED ORDER — ACETAMINOPHEN 500 MG PO TABS: 1000.0000 mg | ORAL_TABLET | Freq: Three times a day (TID) | ORAL | 0 refills | Status: AC

## 2017-08-21 MED ORDER — KETOROLAC TROMETHAMINE 60 MG/2ML IM SOLN
30.0000 mg | Freq: Once | INTRAMUSCULAR | Status: AC
  Administered 2017-08-21: 30 mg via INTRAMUSCULAR
  Filled 2017-08-21: qty 2

## 2017-08-21 NOTE — ED Provider Notes (Signed)
[facility] EMERGENCY DEPARTMENT Provider Note  CSN: [id number] Arrival date & time: 08/21/17 1717  Chief Complaint(s) Leg Pain  HPI [name] [name] is a 52 y.o. female with a history of rheumatoid arthritis, morbid obesity who presents to the emergency department with 4-5 days of constant left-sided lower back pain with radiation down the leg to her foot.  Pain is constant but radiation is intermittent.  Exacerbated with movement and certain positions.  Alleviated only by lying still and certain positions.  Patient has tried taking muscle relaxers without relief.  Has not tried taking any other pain medicine, has not tried stretching.  She denies any falls or trauma.  Denies any recent fevers or infections.  No bladder/bowel incontinence.  No lower extremity weakness.  No loss of sensation.  She denies any other physical complaints.  HPI  Past Medical History Past Medical History:  Diagnosis Date  . Anxiety   . Coronary artery calcification seen on CAT scan    minimal CAD with 30% prox and mild LAD  . Depression   . Diastolic dysfunction   . Dyslipidemia 01/30/2015  . Esophageal ring   . Fibromyalgia   . Hiatal hernia   . HSV-1 infection   . Morbid obesity (HCC)   . Rheumatoid arthritis(714.0)    M05.79  . Rheumatoid arthritis, seropositive, 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HCC)    Treated with Orencia, TB neg 09/12/2015   Patient Active Problem List   Diagnosis Date Noted  . High risk medication use 08/03/2016  . Fibromyalgia syndrome 08/03/2016  . Abnormal cardiac CT angiography   . Sacrococcygeal pain 02/20/2016  . Rheumatoid arthritis involving 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HCC) 01/24/2016  . 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 Dyslipidemia 01/30/2015  . 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 Essential hypertension 08/25/2014  . Morbid obesity (HCC) 08/25/2014  . CAD (coronary artery disease), native coronary artery 08/24/2014  .  Numbness and tingling in left arm   . Arm numbness left 08/22/2014  . Chest pain 08/22/2014  . Gastroesophageal reflux disease without esophagitis 07/23/2014  . Coronary artery calcification seen on CAT scan 02/19/2014  . Hip pain 10/23/2013  . Solitary pulmonary nodule 08/28/2013  . Chest pain, atypical 07/18/2013  . Heart palpitations 06/27/2013  . Cough 06/04/2013  . Diastolic dysfunction 05/18/2013  . SOB (shortness of breath) 05/09/2013  . Other malaise and fatigue 01/21/2013  . Stress and adjustment reaction 12/09/2012  . Right sided abdominal pain 11/29/2012  . 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/21/2012  . Morbid obesity with BMI of 50.0-59.9, adult (HCC) 11/21/2012  . Arthritis 07/13/2012  . Depression 07/13/2012  . Routine health maintenance 05/07/2012   Home Medication(s) Prior to Admission medications   Medication Sig Start Date End Date Taking? Authorizing Provider  Abatacept (ORENCIA IV) Inject 1,000 mg into the vein every 28 (twenty-eight) days.    Yes [provider]  Adapalene 0.3 % gel Apply 1 application topically daily as needed (acne).  12/31/14  Yes [provider]  ARIPiprazole (ABILIFY) 2 MG tablet Take 1 tablet (2 mg total) by mouth daily. 04/22/15  Yes Benjaman Pott, MD  B-D TB SYRINGE 1CC/27GX1/2" 27G X 1/2" 1 ML MISC TO USE WITH INJECTABLE METHOTREXATE ONCE A WEEK 07/04/17  Yes Deveshwar, Janalyn Rouse, MD  Betamethasone Valerate 0.12 % foam Apply 1 application topically daily as needed. Itching 04/22/15  Yes [provider]  buPROPion (WELLBUTRIN XL) 300 MG 24 hr tablet Take 1 tablet (300  mg total) by mouth daily. 12/28/14  Yes Adonis Brook, NP  clindamycin-benzoyl peroxide (BENZACLIN) gel Apply 1 application topically daily as needed. Acne 02/13/15  Yes [provider]  clobetasol cream (TEMOVATE) 0.05 % Apply 1 application topically daily as needed. Skin discoloration 04/22/15  Yes [provider]    cyclobenzaprine (FLEXERIL) 10 MG tablet TAKE 1 TABLET EVERY 6-8HRS AS NEEDED FOR MUSCLE SPASMS 04/18/17  Yes Kerrin Champagne, MD  diclofenac sodium (VOLTAREN) 1 % GEL Apply 3 g to 3 large joints, up to 3 times daily as needed. 07/18/17  Yes Gearldine Bienenstock, PA-C  diltiazem (CARDIZEM CD) 180 MG 24 hr capsule Take 1 capsule (180 mg total) by mouth daily. Please make yearly appt with Dr. Mayford Knife for January. 1st attempt 06/13/17  Yes Turner, Cornelious Bryant, MD  EPIPEN 2-PAK 0.3 MG/0.3ML SOAJ injection Inject 0.3 mg into the skin as needed (allergic reaction).  12/30/14  Yes [provider]  famciclovir (FAMVIR) 500 MG tablet Take 1500mg  on the onset of fever blister for 2 or 3 days as needed for fever blisters 01/21/15  Yes [provider]  fluticasone (FLONASE) 50 MCG/ACT nasal spray Place 2 sprays into both nostrils as needed for allergies. 12/28/14 08/21/17 Yes Adonis Brook, NP  Folic Acid-Vit B6-Vit B12 (FABB) 2.2-25-1 MG TABS Take 1 tablet by mouth daily. 11/11/16  Yes Panwala, Naitik, PA-C  gabapentin (NEURONTIN) 100 MG capsule TAKE 1 CAPSULE BY MOUTH THREE TIMES A DAY 08/17/17  Yes Deveshwar, Janalyn Rouse, MD  hydrOXYzine (VISTARIL) 25 MG capsule Take 25 mg by mouth every 8 (eight) hours as needed for anxiety.  12/30/14  Yes [provider]  levothyroxine (SYNTHROID, LEVOTHROID) 25 MCG tablet Take 1 tablet (25 mcg total) by mouth daily before breakfast. 12/28/14  Yes Adonis Brook, NP  lidocaine (LIDODERM) 5 % Place 1 patch onto the skin daily as needed (pain). Hip 11/11/16  Yes Panwala, Naitik, PA-C  Methotrexate, Anti-Rheumatic, (OTREXUP [place]) Inject into the skin.   Yes [provider]  Methotrexate, PF, 25 MG/0.5ML SOAJ Inject 25 mg into the skin once a week. 07/22/17  Yes Deveshwar, Janalyn Rouse, MD  nitroGLYCERIN (NITROSTAT) 0.4 MG SL tablet PLACE 1 TABLET UNDER THE TONGUE EVERY 5 MINUTES AS NEEDED FOR CHEST PAIN. 09/06/16  Yes Turner, Traci R, MD  pantoprazole (PROTONIX) 40 MG tablet TAKE  1 TABLET (40 MG TOTAL) BY MOUTH 2 (TWO) TIMES DAILY. 02/03/16  Yes Esterwood, Amy S, PA-C  rosuvastatin (CRESTOR) 20 MG tablet Take 20 mg by mouth. 09/21/16 09/21/17 Yes [provider]  tretinoin (RETIN-A) 0.025 % cream Apply 1 application topically daily. 02/11/15  Yes [provider]  triamcinolone cream (KENALOG) 0.1 % Apply 1 application topically daily as needed. Itching 04/13/15  Yes [provider]  Tuberculin-Allergy Syringes 28G X 1/2" 1 ML MISC To use with injectable methotrexate once a week 04/28/16  Yes Deveshwar, Janalyn Rouse, MD  XERESE 5-1 % CREA Apply 1 application topically daily as needed (fever blisters).  12/30/14  Yes [provider]  acetaminophen (TYLENOL) 500 MG tablet Take 2 tablets (1,000 mg total) by mouth every 8 (eight) hours for 5 days. Do not take more than 4000 mg of acetaminophen (Tylenol) in a 24-hour period. Please note that other medicines that you may be prescribed may have Tylenol as well. 08/21/17 08/26/17  Nira Conn, MD  amitriptyline (ELAVIL) 10 MG tablet Take 10 mg by mouth at bedtime.    [provider]  aspirin EC 81 MG  tablet Take 81 mg by mouth daily.    [provider]  atorvastatin (LIPITOR) 80 MG tablet Take 1 tablet (80 mg total) by mouth daily at 6 PM. Patient not taking: Reported on 08/19/2017 05/20/16   Robbie Lis M, PA-C  cyclobenzaprine (FLEXERIL) 10 MG tablet TAKE 1 TABLET BY MOUTH EVERY 6 TO 8 HOURS AS NEEDED MUSCLE SPASMS 09/07/16   Kerrin Champagne, MD  doxepin (SINEQUAN) 10 MG capsule Take 10 mg by mouth at bedtime as needed (sleep).    [provider]  FLUoxetine (PROZAC) 10 MG capsule Take 10 mg by mouth daily.    [provider]  folic acid (FOLVITE) 1 MG tablet Take 2 tablets (2 mg total) by mouth daily. Patient not taking: Reported on 08/19/2017 08/05/16 10/29/17  Tawni Pummel, PA-C  metoprolol tartrate (LOPRESSOR) 25 MG tablet Take 1 tablet (25 mg total) by mouth  2 (two) times daily. Patient not taking: Reported on 08/05/2016 06/04/16 09/02/16  Manson Passey, PA  predniSONE (DELTASONE) 5 MG tablet Take 20mg  a day for 4 days, then 15mg  a day for 4 days, then 10mg  a day for 4 days, then 5mg  a day for 4 days then stop. Patient not taking: Reported on 08/19/2017 06/02/17   Pollyann Savoy, MD  promethazine-dextromethorphan (PROMETHAZINE-DM) 6.25-15 MG/5ML syrup Take 5 mLs by mouth 4 (four) times daily as needed for cough. Patient not taking: Reported on 08/19/2017 05/13/17   Fayrene Helper, PA-C                                                                                                                                    Past Surgical History Past Surgical History:  Procedure Laterality Date  . CARDIAC CATHETERIZATION N/A 06/11/2016   Procedure: Left Heart Cath and Coronary Angiography;  Surgeon: Corky Crafts, MD;  Location[facility] INVASIVE CV LAB;  Service: Cardiovascular;  Laterality: N/A;  . CESAREAN SECTION    . COLONOSCOPY  07/27/2017  . ESOPHAGEAL MANOMETRY N/A 10/28/2014   Procedure: ESOPHAGEAL MANOMETRY (EM);  Surgeon: Hilarie Fredrickson, MD;  Location: [facility] ENDOSCOPY;  Service: Endoscopy;  Laterality: N/A;  . HERNIA REPAIR     reports surgery on 3 hernias, with 2 more present  . LAPAROSCOPIC GASTRIC SLEEVE RESECTION  11/21/12   [facility]  . LEFT HEART CATHETERIZATION WITH CORONARY ANGIOGRAM N/A 08/26/2014   Procedure: LEFT HEART CATHETERIZATION WITH CORONARY ANGIOGRAM;  Surgeon: Runell Gess, MD;  Location: [facility] CATH LAB;  Service: Cardiovascular;  Laterality: N/A;  . TUBAL LIGATION     Family History Family History  Problem Relation Age of Onset  . Hyperlipidemia Mother   . Depression Mother   . Sarcoidosis Father   . Lung disease Father        Pleural Mesothelioma  . Cancer Father   . Heart attack Paternal Grandmother   . Hypertension Paternal Grandmother   . Hypertension Maternal Grandmother   .  Diabetes Maternal Grandmother   .  Stroke Neg Hx   . Colon cancer Neg Hx   . Esophageal cancer Neg Hx   . Rectal cancer Neg Hx   . Stomach cancer Neg Hx     Social History Social History   Tobacco Use  . Smoking status: Never Smoker  . Smokeless tobacco: Never Used  Substance Use Topics  . Alcohol use: No  . Drug use: No   Allergies Influenza vaccines and Isosorbide  Review of Systems Review of Systems All other systems are reviewed and are negative for acute change except as noted in the HPI  Physical Exam Vital Signs  I have reviewed the triage vital signs BP 128/77 (BP Location: Right Arm)   Pulse 84   Temp 98.5 F (36.9 C) (Oral)   Resp 18   Ht 5\' 2"  (1.575 m)   Wt 121.1 kg (267 lb)   SpO2 100%   BMI 48.83 kg/m   Physical Exam  Constitutional: She is oriented to person, place, and time. She appears well-developed and well-nourished. No distress.  Obese  HENT:  Head: Normocephalic and atraumatic.  Right Ear: External ear normal.  Left Ear: External ear normal.  Nose: Nose normal.  Eyes: Conjunctivae and EOM are normal. No scleral icterus.  Neck: Normal range of motion and phonation normal.  Cardiovascular: Normal rate and regular rhythm.  Pulmonary/Chest: Effort normal. No stridor. No respiratory distress.  Abdominal: She exhibits no distension.  Musculoskeletal: Normal range of motion. She exhibits no edema.       Lumbar back: She exhibits tenderness. She exhibits no bony tenderness.       Back:       Left upper leg: She exhibits tenderness.       Legs: Neurological: She is alert and oriented to person, place, and time.  Spine Exam: Strength: 5/5 throughout LE bilaterally  Sensation: Intact to light touch in proximal and distal LE bilaterally Reflexes: 1+ quadriceps and achilles reflexes   Skin: She is not diaphoretic.  Psychiatric: She has a normal mood and affect. Her behavior is normal.  Vitals reviewed.   ED Results and Treatments Labs (all labs ordered are listed, but only  abnormal results are displayed) Labs Reviewed - No data to display                                                                                                                       EKG  EKG Interpretation  Date/Time:    Ventricular Rate:    PR Interval:    QRS Duration:   QT Interval:    QTC Calculation:   R Axis:     Text Interpretation:        Radiology Dg Lumbar Spine 2-3 Views  Result Date: 08/21/2017 CLINICAL DATA:  Left posterior hip pain radiating down the left leg x3 days without known injury. EXAM: LUMBAR SPINE - 2-3 VIEW COMPARISON:  CT lumbar spine 04/02/2015 FINDINGS: Redemonstration of bilateral sacroiliac joint  sclerosis and joint space narrowing consistent with chronic sacroiliitis. There are 5 non ribbed lumbar vertebrae without evidence of fracture or suspicious osseous lesions. There is joint space narrowing facet sclerosis from L3 through S1 most prominent at L5-S1. There is normal lumbar lordosis. No pars defects. Disc spaces are maintained. IMPRESSION: 1. Lumbar facet arthropathy L3 through S1 more severely affecting L5-S1. 2. Redemonstration of chronic bilateral sacroiliitis with sclerosis and joint space narrowing of the sacroiliac joints. 3. No acute lumbar spine fracture or suspicious osseous lesions. No significant disc flattening. Electronically Signed   By: Tollie Eth M.D.   On: 08/21/2017 20:55   Pertinent labs & imaging results that were available during my care of the patient were reviewed by me and considered in my medical decision making (see chart for details).  Medications Ordered in ED Medications  ketorolac (TORADOL) injection 30 mg (30 mg Intramuscular Given 08/21/17 2022)                                                                                                                                    Procedures Procedures  (including critical care time)  Medical Decision Making / ED Course I have reviewed the nursing notes for this encounter  and the patient's prior records (if available in EHR or on provided paperwork).    52 y.o. female presents with back pain in lumbar area for 5 days with signs of radicular pain. No acute traumatic onset. No red flag symptoms of fever, weight loss, saddle anesthesia, weakness, fecal/urinary incontinence or urinary retention.  No evidence suggesting cauda equina.  Suspect MSK etiology vs sciatica. Plain film with arthropathy in lumbosacral facets. patient was recommended to take short course of scheduled Tylenol and engage in early mobility as definitive treatment. Return precautions discussed for worsening or new concerning symptoms.   Recommended close follow-up with PCP.   Final Clinical Impression(s) / ED Diagnoses Final diagnoses:  Left sided sciatica    Disposition: Discharge  Condition: Good  I have discussed the results, Dx and Tx plan with the patient and husband who expressed understanding and agree(s) with the plan. Discharge instructions discussed at great length. The patient and husband were given strict return precautions who verbalized understanding of the instructions. No further questions at time of discharge.    ED Discharge Orders        Ordered    acetaminophen (TYLENOL) 500 MG tablet  Every 8 hours     08/21/17 2120       Follow Up: [name], MD [address] [place] [address] [phone]  Schedule an appointment as soon as possible for a visit  In 1-2 weeks if pain persists     This chart was dictated using voice recognition software.  Despite best efforts to proofread,  errors can occur which can change the documentation meaning.   Nira Conn, MD 08/21/17 2122

## 2017-08-21 NOTE — ED Triage Notes (Signed)
Pt reports shooting pain leg left since Thursday. Reports she saw her doctor on Friday and had a shot for bursitis. Denies injury. Pain worse with movement and sitting. Pt has a hx of RA

## 2017-08-21 NOTE — Discharge Instructions (Signed)
You may use over-the-counter Motrin (Ibuprofen), Acetaminophen (Tylenol), topical muscle creams such as SalonPas, Icy Hot, Bengay, etc. Please stretch, apply heat, and have massage therapy for additional assistance. ° °

## 2017-08-21 NOTE — ED Notes (Signed)
Pt given d/c instructions as per chart. Rx x 1. Verbalizes understanding. No questions. 

## 2017-08-22 ENCOUNTER — Telehealth (INDEPENDENT_AMBULATORY_CARE_PROVIDER_SITE_OTHER): Payer: Self-pay | Admitting: Specialist

## 2017-08-22 NOTE — Telephone Encounter (Signed)
Patient would like a call from you, [name] [name] before she schedules her appt with Dr. Otelia Sergeant. CB# [phone]

## 2017-08-23 ENCOUNTER — Telehealth (INDEPENDENT_AMBULATORY_CARE_PROVIDER_SITE_OTHER): Payer: Self-pay | Admitting: Specialist

## 2017-08-23 NOTE — Telephone Encounter (Signed)
Patient came in to office.  I discussed the problem with her.  I advised her to keep her appt with James on Thursday, and if James saw it fit to order the MRI or meds then we can go ahead and get it started and then follow up with Dr. Nitka on 09/26/17 to review scan if necessary, I will also place her on the cancellation list and if something opens sooner with Dr. Nitka we will get her in sooner.  Patient states that she understands this and she will keep her appt on 08/25/2017 with James Owens, PA-C 

## 2017-08-23 NOTE — Telephone Encounter (Signed)
Patient came in to office.  I discussed the problem with her.  I advised her to keep her appt with Fayrene Fearing on Thursday, and if Fayrene Fearing saw it fit to order the MRI or meds then we can go ahead and get it started and then follow up with Dr. Otelia Sergeant on 09/26/17 to review scan if necessary, I will also place her on the cancellation list and if something opens sooner with Dr. Otelia Sergeant we will get her in sooner.  Patient states that she understands this and she will keep her appt on 08/25/2017 with Zonia Kief, PA-C

## 2017-08-23 NOTE — Telephone Encounter (Signed)
Patient came into the office wanting to see Dr. Otelia [name] for some lower back pain radiating down her left leg.  She has an appointment with Fayrene Fearing on Thursday (3/21) and I advised her that I could ask you to put her on the cancellation list.  CB#[phone].  Thank you.

## 2017-08-25 ENCOUNTER — Ambulatory Visit (INDEPENDENT_AMBULATORY_CARE_PROVIDER_SITE_OTHER): Payer: BLUE CROSS/BLUE SHIELD | Admitting: Surgery

## 2017-08-25 ENCOUNTER — Ambulatory Visit (INDEPENDENT_AMBULATORY_CARE_PROVIDER_SITE_OTHER): Payer: BLUE CROSS/BLUE SHIELD

## 2017-08-25 ENCOUNTER — Encounter (INDEPENDENT_AMBULATORY_CARE_PROVIDER_SITE_OTHER): Payer: Self-pay | Admitting: Surgery

## 2017-08-25 DIAGNOSIS — M25552 Pain in left hip: Secondary | ICD-10-CM

## 2017-08-25 DIAGNOSIS — G8929 Other chronic pain: Secondary | ICD-10-CM | POA: Diagnosis not present

## 2017-08-25 DIAGNOSIS — M533 Sacrococcygeal disorders, not elsewhere classified: Secondary | ICD-10-CM

## 2017-08-25 DIAGNOSIS — M5416 Radiculopathy, lumbar region: Secondary | ICD-10-CM | POA: Diagnosis not present

## 2017-08-25 NOTE — Progress Notes (Signed)
Office Visit Note   Patient: [name] [name]           Date of Birth: 22-Dec-1965           MRN: [id number] Visit Date: 08/25/2017              Requested by: [name], MD [address] [place], [place] [address] PCP: [name], MD   Assessment & Plan: Visit Diagnoses:  1. Pain in left hip   2. Chronic SI joint pain   3. Radiculopathy, lumbar region     Plan: With patient's chronic low back pain and lower extremity radiculopathy I will get a lumbar MRI to rule out HNP/stenosis.  I think patient has a fairly significant problem with the severe bilateral SI joint degenerative changes that she has.  Patient stated that surgeon at [facility] would not do a fusion of the SI joints because he only does that if a patient has had a bad trauma.  I did speak with Dr. Estill Bamberg here in town and he agreed to see patient to see if she is a operative candidate for that.  He recommended getting  CT guided bilateral SI joint injections with anesthetic and this was ordered.  I will schedule patient an appointment to see him after that study has been completed.  Follow-up with Dr. Otelia Sergeant to review lumbar MRI.  All questions answered.  Follow-Up Instructions: Return in about 3 weeks (around 09/15/2017) for with Dr Otelia Sergeant to review mri lumbar and CT scan.   Orders:  Orders Placed This Encounter  Procedures  . XR HIP UNILAT W OR W/O PELVIS 2-3 VIEWS LEFT  . CT GUIDED NEEDLE PLACEMENT  . MR Lumbar Spine w/o contrast   No orders of the defined types were placed in this encounter.     Procedures: No procedures performed   Clinical Data: No additional findings.   Subjective: Chief Complaint  Patient presents with  . Left Hip - Pain  . Lower Back - Pain    HPI Patient returns to the office with complaints of low back pain and left lower extremity radiculopathy.  Patient was recently seen in the emergency room 4 days ago and diagnosed with left-sided sciatica.   Was also recently seen by Dr. Corliss Skains and was given a left lateral hip Marcaine/Depo-Medrol injection.  Continues to have ongoing severe pain over the bilateral SI joints.  Has had multiple injections in the past.  Dr Otelia Sergeant had referred to [facility] to see about possible SI joint fusions but the physician out there did not think that she was an operative candidate.  Patient stated she was told that he only does that procedure on patients who have had severe trauma.  Patient describes symptoms as being constant and aggravated with activity. Review of Systems No current cardiac pulmonary GI GU issues  Objective: Vital Signs: There were no vitals taken for this visit.  Physical Exam  Constitutional: She is oriented to person, place, and time. No distress.  HENT:  Head: Normocephalic and atraumatic.  Eyes: Pupils are equal, round, and reactive to light.  Pulmonary/Chest: No respiratory distress.  Musculoskeletal:  Gait antalgic.  Tender over the bilateral SI joints.  Tender over the left hip greater trochanter bursa.  Negative straight leg raise.  Calves nontender.  Neurovascular intact.  No focal motor deficits.  Neurological: She is alert and oriented to person, place, and time.  Skin: Skin is warm and dry.  Psychiatric: She has a normal mood and affect.    Ortho Exam  Specialty Comments:  No specialty comments available.  Imaging: No results found.   PMFS History: Patient Active Problem List   Diagnosis Date Noted  . High risk medication use 08/03/2016  . Fibromyalgia syndrome 08/03/2016  . Abnormal cardiac CT angiography   . Sacrococcygeal pain 02/20/2016  . Rheumatoid arthritis involving 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HCC) 01/24/2016  . 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 Dyslipidemia 01/30/2015  . 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 Essential hypertension 08/25/2014  . Morbid obesity (HCC) 08/25/2014  . CAD (coronary artery  disease), native coronary artery 08/24/2014  . Numbness and tingling in left arm   . Arm numbness left 08/22/2014  . Chest pain 08/22/2014  . Gastroesophageal reflux disease without esophagitis 07/23/2014  . Coronary artery calcification seen on CAT scan 02/19/2014  . Hip pain 10/23/2013  . Solitary pulmonary nodule 08/28/2013  . Chest pain, atypical 07/18/2013  . Heart palpitations 06/27/2013  . Cough 06/04/2013  . Diastolic dysfunction 05/18/2013  . SOB (shortness of breath) 05/09/2013  . Other malaise and fatigue 01/21/2013  . Stress and adjustment reaction 12/09/2012  . Right sided abdominal pain 11/29/2012  . 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/21/2012  . Morbid obesity with BMI of 50.0-59.9, adult (HCC) 11/21/2012  . Arthritis 07/13/2012  . Depression 07/13/2012  . Routine health maintenance 05/07/2012   Past Medical History:  Diagnosis Date  . Anxiety   . Coronary artery calcification seen on CAT scan    minimal CAD with 30% prox and mild LAD  . Depression   . Diastolic dysfunction   . Dyslipidemia 01/30/2015  . Esophageal ring   . Fibromyalgia   . Hiatal hernia   . HSV-1 infection   . Morbid obesity (HCC)   . Rheumatoid arthritis(714.0)    M05.79  . Rheumatoid arthritis, seropositive, 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HCC)    Treated with Orencia, TB neg 09/12/2015    Family History  Problem Relation Age of Onset  . Hyperlipidemia Mother   . Depression Mother   . Sarcoidosis Father   . Lung disease Father        Pleural Mesothelioma  . Cancer Father   . Heart attack Paternal Grandmother   . Hypertension Paternal Grandmother   . Hypertension Maternal Grandmother   . Diabetes Maternal Grandmother   . Stroke Neg Hx   . Colon cancer Neg Hx   . Esophageal cancer Neg Hx   . Rectal cancer Neg Hx   . Stomach cancer Neg Hx     Past Surgical History:  Procedure Laterality Date  . CARDIAC CATHETERIZATION N/A 06/11/2016   Procedure: Left Heart Cath and Coronary  Angiography;  Surgeon: Corky Crafts, MD;  Location: [facility] INVASIVE CV LAB;  Service: Cardiovascular;  Laterality: N/A;  . CESAREAN SECTION    . COLONOSCOPY  07/27/2017  . ESOPHAGEAL MANOMETRY N/A 10/28/2014   Procedure: ESOPHAGEAL MANOMETRY (EM);  Surgeon: Hilarie Fredrickson, MD;  Location: [facility] ENDOSCOPY;  Service: Endoscopy;  Laterality: N/A;  . HERNIA REPAIR     reports surgery on 3 hernias, with 2 more present  . LAPAROSCOPIC GASTRIC SLEEVE RESECTION  11/21/12   [facility]  . LEFT HEART CATHETERIZATION WITH CORONARY ANGIOGRAM N/A 08/26/2014   Procedure: LEFT HEART CATHETERIZATION WITH CORONARY ANGIOGRAM;  Surgeon: Runell Gess, MD;  Location: [facility] CATH LAB;  Service: Cardiovascular;  Laterality: N/A;  . TUBAL LIGATION  Social History   Occupational History  . Occupation: Financial trader SERVICE    Employer: [employer]  Tobacco Use  . Smoking status: Never Smoker  . Smokeless tobacco: Never Used  Substance and Sexual Activity  . Alcohol use: No  . Drug use: No  . Sexual activity: Yes    Partners: Male    Birth control/protection: None

## 2017-08-26 ENCOUNTER — Telehealth (INDEPENDENT_AMBULATORY_CARE_PROVIDER_SITE_OTHER): Payer: Self-pay | Admitting: Surgery

## 2017-08-26 ENCOUNTER — Encounter (INDEPENDENT_AMBULATORY_CARE_PROVIDER_SITE_OTHER): Payer: Self-pay | Admitting: Surgery

## 2017-08-26 ENCOUNTER — Other Ambulatory Visit (INDEPENDENT_AMBULATORY_CARE_PROVIDER_SITE_OTHER): Payer: Self-pay | Admitting: Surgery

## 2017-08-26 DIAGNOSIS — G8929 Other chronic pain: Secondary | ICD-10-CM

## 2017-08-26 DIAGNOSIS — M533 Sacrococcygeal disorders, not elsewhere classified: Principal | ICD-10-CM

## 2017-08-26 MED ORDER — DIAZEPAM 2 MG PO TABS: ORAL_TABLET | ORAL | 0 refills | Status: DC

## 2017-08-26 NOTE — Telephone Encounter (Signed)
Per Message from James--I called in Valium 2 mg, 1 tablet 1 hour prior to scan, repeat prior to scan if needed. MUST HAVE A DRIVER.  I called and advised patient of this.

## 2017-08-26 NOTE — Telephone Encounter (Signed)
Patient called stating that her MRI is scheduled for Sunday.  She is claustrophobic and is needing an RX prescription for Valium called into her pharmacy.  She uses [facility] on [address]   [phone].  Thank you.

## 2017-08-28 ENCOUNTER — Ambulatory Visit
Admission: RE | Admit: 2017-08-28 | Discharge: 2017-08-28 | Disposition: A | Discharge status: Home / Self Care | Payer: BLUE CROSS/BLUE SHIELD | Source: Ambulatory Visit | Attending: Surgery | Admitting: Surgery

## 2017-08-28 DIAGNOSIS — M5416 Radiculopathy, lumbar region: Secondary | ICD-10-CM

## 2017-08-31 ENCOUNTER — Encounter (HOSPITAL_COMMUNITY)
Admission: RE | Admit: 2017-08-31 | Discharge: 2017-08-31 | Disposition: A | Discharge status: Home / Self Care | Payer: BLUE CROSS/BLUE SHIELD | Source: Ambulatory Visit | Attending: Rheumatology | Admitting: Rheumatology

## 2017-08-31 DIAGNOSIS — M0579 Rheumatoid arthritis with rheumatoid factor of multiple sites without organ or systems involvement: Secondary | ICD-10-CM | POA: Diagnosis not present

## 2017-08-31 LAB — COMPREHENSIVE METABOLIC PANEL
ALK PHOS: 58 U/L (ref 38–126)
ALT: 12 U/L — ABNORMAL LOW (ref 14–54)
ANION GAP: 5 (ref 5–15)
AST: 15 U/L (ref 15–41)
Albumin: 2.8 g/dL — ABNORMAL LOW (ref 3.5–5.0)
BILIRUBIN TOTAL: 0.4 mg/dL (ref 0.3–1.2)
BUN: 9 mg/dL (ref 6–20)
CALCIUM: 8.4 mg/dL — AB (ref 8.9–10.3)
CO2: 25 mmol/L (ref 22–32)
Chloride: 109 mmol/L (ref 101–111)
Creatinine, Ser: 0.76 mg/dL (ref 0.44–1.00)
GFR calc Af Amer: >60 mL/min (ref >60)
GLUCOSE: 85 mg/dL (ref 65–99)
Potassium: 3.8 mmol/L (ref 3.5–5.1)
Sodium: 139 mmol/L (ref 135–145)
TOTAL PROTEIN: 5.9 g/dL — AB (ref 6.5–8.1)

## 2017-08-31 LAB — CBC
HEMATOCRIT: 40.5 % (ref 36.0–46.0)
HEMOGLOBIN: 12.7 g/dL (ref 12.0–15.0)
MCH: 26.8 pg (ref 26.0–34.0)
MCHC: 31.4 g/dL (ref 30.0–36.0)
MCV: 85.4 fL (ref 78.0–100.0)
Platelets: 261 10*3/uL (ref 150–400)
RBC: 4.74 MIL/uL (ref 3.87–5.11)
RDW: 12.9 % (ref 11.5–15.5)
WBC: 10.5 10*3/uL (ref 4.0–10.5)

## 2017-08-31 MED ORDER — SODIUM CHLORIDE 0.9 % IV SOLN
1000.0000 mg | INTRAVENOUS | Status: AC
  Administered 2017-08-31: 13:00:00 1000 mg via INTRAVENOUS
  Filled 2017-08-31: qty 40

## 2017-08-31 MED ORDER — DIPHENHYDRAMINE HCL 25 MG PO CAPS
25.0000 mg | ORAL_CAPSULE | ORAL | Status: DC
  Administered 2017-08-31: 25 mg via ORAL

## 2017-08-31 MED ORDER — DIPHENHYDRAMINE HCL 25 MG PO CAPS
ORAL_CAPSULE | ORAL | Status: AC
  Filled 2017-08-31: qty 1

## 2017-08-31 MED ORDER — ACETAMINOPHEN 325 MG PO TABS
650.0000 mg | ORAL_TABLET | ORAL | Status: DC
  Administered 2017-08-31: 650 mg via ORAL

## 2017-08-31 MED ORDER — SODIUM CHLORIDE 0.9 % IV SOLN
INTRAVENOUS | Status: DC
  Administered 2017-08-31: 13:00:00 via INTRAVENOUS

## 2017-08-31 MED ORDER — ACETAMINOPHEN 325 MG PO TABS
ORAL_TABLET | ORAL | Status: AC
  Filled 2017-08-31: qty 2

## 2017-09-04 LAB — QUANTIFERON-TB GOLD PLUS (RQFGPL)
QUANTIFERON MITOGEN VALUE: 0.55 [IU]/mL
QUANTIFERON NIL VALUE: 0.02 [IU]/mL
QUANTIFERON TB2 AG VALUE: 0.03 [IU]/mL
QuantiFERON TB1 Ag Value: 0.04 IU/mL

## 2017-09-04 LAB — QUANTIFERON-TB GOLD PLUS: QuantiFERON-TB Gold Plus: NEGATIVE

## 2017-09-07 ENCOUNTER — Ambulatory Visit
Admission: RE | Admit: 2017-09-07 | Discharge: 2017-09-07 | Disposition: A | Discharge status: Home / Self Care | Payer: BLUE CROSS/BLUE SHIELD | Source: Ambulatory Visit | Attending: Surgery | Admitting: Surgery

## 2017-09-07 ENCOUNTER — Other Ambulatory Visit (INDEPENDENT_AMBULATORY_CARE_PROVIDER_SITE_OTHER): Payer: Self-pay | Admitting: Surgery

## 2017-09-07 DIAGNOSIS — M533 Sacrococcygeal disorders, not elsewhere classified: Principal | ICD-10-CM

## 2017-09-07 DIAGNOSIS — G8929 Other chronic pain: Secondary | ICD-10-CM

## 2017-09-07 MED ORDER — DIAZEPAM 5 MG PO TABS
10.0000 mg | ORAL_TABLET | Freq: Once | ORAL | Status: AC
  Administered 2017-09-07: 10 mg via ORAL

## 2017-09-09 ENCOUNTER — Encounter (INDEPENDENT_AMBULATORY_CARE_PROVIDER_SITE_OTHER): Payer: Self-pay | Admitting: Surgery

## 2017-09-12 ENCOUNTER — Encounter (INDEPENDENT_AMBULATORY_CARE_PROVIDER_SITE_OTHER): Payer: Self-pay | Admitting: Surgery

## 2017-09-14 ENCOUNTER — Encounter (INDEPENDENT_AMBULATORY_CARE_PROVIDER_SITE_OTHER): Payer: Self-pay | Admitting: Specialist

## 2017-09-14 ENCOUNTER — Ambulatory Visit (INDEPENDENT_AMBULATORY_CARE_PROVIDER_SITE_OTHER): Payer: BLUE CROSS/BLUE SHIELD | Admitting: Specialist

## 2017-09-14 VITALS — BP 130/85 | HR 80 | Ht 62.0 in | Wt 267.0 lb

## 2017-09-14 DIAGNOSIS — M533 Sacrococcygeal disorders, not elsewhere classified: Secondary | ICD-10-CM

## 2017-09-14 DIAGNOSIS — M47816 Spondylosis without myelopathy or radiculopathy, lumbar region: Secondary | ICD-10-CM

## 2017-09-14 NOTE — Patient Instructions (Signed)
Avoid bending, stooping and avoid lifting weights greater than 10 lbs. Avoid prolong standing and walking. Avoid frequent bending and stooping  No lifting greater than 10 lbs. May use ice or moist heat for pain. Weight loss is of benefit. Handicap license is approved. Pool walking or stationary bike for exercise. Will refer to Dr. Yevette Edwards for assessment for consideration of SI fusions.

## 2017-09-14 NOTE — Progress Notes (Signed)
Office Visit Note   Patient: [name] [name]           Date of Birth: 1965/07/20           MRN: [id number] Visit Date: 09/14/2017              Requested by: [name], MD [address] [place], [place] [address] PCP: [name], MD   Assessment & Plan: Visit Diagnoses:  1. Sacroiliac joint disease   2. Spondylosis without myelopathy or radiculopathy, lumbar region     Plan:Avoid bending, stooping and avoid lifting weights greater than 10 lbs. Avoid prolong standing and walking. Avoid frequent bending and stooping  No lifting greater than 10 lbs. May use ice or moist heat for pain. Weight loss is of benefit. Handicap license is approved. Pool walking or stationary bike for exercise. Will refer to Dr. Yevette Edwards for assessment for consideration of SI fusions.  Follow-Up Instructions: Return if symptoms worsen or fail to improve.   Orders:  No orders of the defined types were placed in this encounter.  No orders of the defined types were placed in this encounter.     Procedures: No procedures performed   Clinical Data: No additional findings.   Subjective: Chief Complaint  Patient presents with  . Lower Back - Follow-up    52 year old female with history of SI pain and buttock pain with radiation into the backs of the legs into the posterior thighs and calfs and posterior ankles. She reports about 50%  Improvement with CT guided  SI joint injection with sensorcaine   Review of Systems  Constitutional: Negative.   HENT: Negative.   Eyes: Negative.   Respiratory: Negative.   Cardiovascular: Negative.   Gastrointestinal: Negative.   Endocrine: Negative.   Genitourinary: Negative.   Musculoskeletal: Negative.   Skin: Negative.   Allergic/Immunologic: Negative.   Neurological: Negative.   Hematological: Negative.   Psychiatric/Behavioral: Negative.      Objective: Vital Signs: BP 130/85 (BP Location: Left Arm, Patient Position:  Sitting)   Pulse 80   Ht 5\' 2"  (1.575 m)   Wt 267 lb (121.1 kg)   BMI 48.83 kg/m   Physical Exam  Constitutional: She is oriented to person, place, and time. She appears well-developed and well-nourished.  HENT:  Head: Normocephalic and atraumatic.  Eyes: Pupils are equal, round, and reactive to light. EOM are normal.  Neck: Normal range of motion. Neck supple.  Pulmonary/Chest: Effort normal and breath sounds normal.  Abdominal: Soft. Bowel sounds are normal.  Musculoskeletal: Normal range of motion.  Neurological: She is alert and oriented to person, place, and time.  Skin: Skin is warm and dry.  Psychiatric: She has a normal mood and affect. Her behavior is normal. Judgment and thought content normal.    Ortho Exam  Specialty Comments:  No specialty comments available.  Imaging: No results found.   PMFS History: Patient Active Problem List   Diagnosis Date Noted  . High risk medication use 08/03/2016  . Fibromyalgia syndrome 08/03/2016  . Abnormal cardiac CT angiography   . Sacrococcygeal pain 02/20/2016  . Rheumatoid arthritis involving 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HCC) 01/24/2016  . 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 Dyslipidemia 01/30/2015  . 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 Essential hypertension 08/25/2014  . Morbid obesity (HCC) 08/25/2014  . CAD (coronary artery disease), native coronary artery 08/24/2014  . Numbness and tingling in left arm   .  Arm numbness left 08/22/2014  . Chest pain 08/22/2014  . Gastroesophageal reflux disease without esophagitis 07/23/2014  . Coronary artery calcification seen on CAT scan 02/19/2014  . Hip pain 10/23/2013  . Solitary pulmonary nodule 08/28/2013  . Chest pain, atypical 07/18/2013  . Heart palpitations 06/27/2013  . Cough 06/04/2013  . Diastolic dysfunction 05/18/2013  . SOB (shortness of breath) 05/09/2013  . Other malaise and fatigue 01/21/2013  . Stress and  adjustment reaction 12/09/2012  . Right sided abdominal pain 11/29/2012  . 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/21/2012  . Morbid obesity with BMI of 50.0-59.9, adult (HCC) 11/21/2012  . Arthritis 07/13/2012  . Depression 07/13/2012  . Routine health maintenance 05/07/2012   Past Medical History:  Diagnosis Date  . Anxiety   . Coronary artery calcification seen on CAT scan    minimal CAD with 30% prox and mild LAD  . Depression   . Diastolic dysfunction   . Dyslipidemia 01/30/2015  . Esophageal ring   . Fibromyalgia   . Hiatal hernia   . HSV-1 infection   . Morbid obesity (HCC)   . Rheumatoid arthritis(714.0)    M05.79  . Rheumatoid arthritis, seropositive, 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HCC)    Treated with Orencia, TB neg 09/12/2015    Family History  Problem Relation Age of Onset  . Hyperlipidemia Mother   . Depression Mother   . Sarcoidosis Father   . Lung disease Father        Pleural Mesothelioma  . Cancer Father   . Heart attack Paternal Grandmother   . Hypertension Paternal Grandmother   . Hypertension Maternal Grandmother   . Diabetes Maternal Grandmother   . Stroke Neg Hx   . Colon cancer Neg Hx   . Esophageal cancer Neg Hx   . Rectal cancer Neg Hx   . Stomach cancer Neg Hx     Past Surgical History:  Procedure Laterality Date  . CARDIAC CATHETERIZATION N/A 06/11/2016   Procedure: Left Heart Cath and Coronary Angiography;  Surgeon: Corky Crafts, MD;  Location: [facility] INVASIVE CV LAB;  Service: Cardiovascular;  Laterality: N/A;  . CESAREAN SECTION    . COLONOSCOPY  07/27/2017  . ESOPHAGEAL MANOMETRY N/A 10/28/2014   Procedure: ESOPHAGEAL MANOMETRY (EM);  Surgeon: Hilarie Fredrickson, MD;  Location: [facility] ENDOSCOPY;  Service: Endoscopy;  Laterality: N/A;  . HERNIA REPAIR     reports surgery on 3 hernias, with 2 more present  . LAPAROSCOPIC GASTRIC SLEEVE RESECTION  11/21/12   [facility]  . LEFT HEART CATHETERIZATION WITH CORONARY ANGIOGRAM N/A 08/26/2014   Procedure:  LEFT HEART CATHETERIZATION WITH CORONARY ANGIOGRAM;  Surgeon: Runell Gess, MD;  Location: [facility] CATH LAB;  Service: Cardiovascular;  Laterality: N/A;  . TUBAL LIGATION     Social History   Occupational History  . Occupation: Financial trader SERVICE    Employer: [employer]vertising [employer]  Tobacco Use  . Smoking status: Never Smoker  . Smokeless tobacco: Never Used  Substance and Sexual Activity  . Alcohol use: No  . Drug use: No  . Sexual activity: Yes    Partners: Male    Birth control/protection: None

## 2017-09-19 ENCOUNTER — Other Ambulatory Visit: Payer: Self-pay | Admitting: Physician Assistant

## 2017-09-19 NOTE — Telephone Encounter (Signed)
Last Visit: 08/19/17 Next Visit: 01/24/18  Okay to refill per Dr. Corliss Skains

## 2017-09-20 ENCOUNTER — Other Ambulatory Visit: Payer: Self-pay | Admitting: *Deleted

## 2017-09-20 MED ORDER — GABAPENTIN 100 MG PO CAPS: ORAL_CAPSULE | ORAL | 2 refills | Status: DC

## 2017-09-20 NOTE — Telephone Encounter (Signed)
Refill request received via fax  Last visit: 08/19/17 Next Visit 01/24/18  Okay to refill per Dr. Corliss Skains

## 2017-09-26 ENCOUNTER — Ambulatory Visit (INDEPENDENT_AMBULATORY_CARE_PROVIDER_SITE_OTHER): Payer: BLUE CROSS/BLUE SHIELD | Admitting: Specialist

## 2017-09-27 ENCOUNTER — Other Ambulatory Visit (HOSPITAL_COMMUNITY): Payer: Self-pay | Admitting: *Deleted

## 2017-09-27 ENCOUNTER — Other Ambulatory Visit: Payer: Self-pay | Admitting: *Deleted

## 2017-09-27 DIAGNOSIS — M0579 Rheumatoid arthritis with rheumatoid factor of multiple sites without organ or systems involvement: Secondary | ICD-10-CM

## 2017-09-27 NOTE — Progress Notes (Signed)
Msg left with Sue Lush at Dr Fatima Sanger office for orders for tomorrow's scheduled orencia.

## 2017-09-28 ENCOUNTER — Inpatient Hospital Stay (HOSPITAL_COMMUNITY): Admission: RE | Admit: 2017-09-28 | Payer: Self-pay | Source: Ambulatory Visit

## 2017-10-04 ENCOUNTER — Telehealth: Payer: Self-pay

## 2017-10-04 NOTE — Telephone Encounter (Signed)
Patient states sh was out of town and missed her April 24 infusion and the infusion center must have cancelled her May appointment. Patient is going to contact infusion center to reschedule her appointment.

## 2017-10-04 NOTE — Telephone Encounter (Signed)
After reviewing pts profile, I learned that she did not show up for her April infusion and has canceled her infusion scheduled for Oct 26, 2017 .  Please advise. Thanks!  Lucciano Vitali, [place], CPhT 1:50 PM

## 2017-10-10 ENCOUNTER — Other Ambulatory Visit: Payer: Self-pay | Admitting: *Deleted

## 2017-10-10 NOTE — Progress Notes (Signed)
Infusion orders are current for patient CBC CMP Tylenol Benadryl appointments are up to date and follow up appointment  is scheduled TB gold not due yet.  

## 2017-10-11 ENCOUNTER — Ambulatory Visit (HOSPITAL_COMMUNITY)
Admission: RE | Admit: 2017-10-11 | Discharge: 2017-10-11 | Disposition: A | Discharge status: Home / Self Care | Payer: BLUE CROSS/BLUE SHIELD | Source: Ambulatory Visit | Attending: Rheumatology | Admitting: Rheumatology

## 2017-10-11 DIAGNOSIS — M0579 Rheumatoid arthritis with rheumatoid factor of multiple sites without organ or systems involvement: Secondary | ICD-10-CM | POA: Insufficient documentation

## 2017-10-11 LAB — CBC
HCT: 41.7 % (ref 36.0–46.0)
Hemoglobin: 13.3 g/dL (ref 12.0–15.0)
MCH: 27.1 pg (ref 26.0–34.0)
MCHC: 31.9 g/dL (ref 30.0–36.0)
MCV: 85.1 fL (ref 78.0–100.0)
PLATELETS: 273 10*3/uL (ref 150–400)
RBC: 4.9 MIL/uL (ref 3.87–5.11)
RDW: 12.9 % (ref 11.5–15.5)
WBC: 9.7 10*3/uL (ref 4.0–10.5)

## 2017-10-11 LAB — COMPREHENSIVE METABOLIC PANEL
ALBUMIN: 3.3 g/dL — AB (ref 3.5–5.0)
ALT: 16 U/L (ref 14–54)
AST: 21 U/L (ref 15–41)
Alkaline Phosphatase: 66 U/L (ref 38–126)
Anion gap: 5 (ref 5–15)
BUN: 8 mg/dL (ref 6–20)
CHLORIDE: 108 mmol/L (ref 101–111)
CO2: 28 mmol/L (ref 22–32)
Calcium: 8.9 mg/dL (ref 8.9–10.3)
Creatinine, Ser: 0.87 mg/dL (ref 0.44–1.00)
GFR calc Af Amer: >60 mL/min (ref >60)
Glucose, Bld: 85 mg/dL (ref 65–99)
POTASSIUM: 4.2 mmol/L (ref 3.5–5.1)
Sodium: 141 mmol/L (ref 135–145)
Total Bilirubin: 0.3 mg/dL (ref 0.3–1.2)
Total Protein: 6.8 g/dL (ref 6.5–8.1)

## 2017-10-11 MED ORDER — DIPHENHYDRAMINE HCL 25 MG PO CAPS
ORAL_CAPSULE | ORAL | Status: AC
  Filled 2017-10-11: qty 1

## 2017-10-11 MED ORDER — DIPHENHYDRAMINE HCL 25 MG PO CAPS
25.0000 mg | ORAL_CAPSULE | ORAL | Status: DC
  Administered 2017-10-11: 25 mg via ORAL

## 2017-10-11 MED ORDER — SODIUM CHLORIDE 0.9 % IV SOLN
1000.0000 mg | INTRAVENOUS | Status: DC
  Administered 2017-10-11: 1000 mg via INTRAVENOUS
  Filled 2017-10-11: qty 40

## 2017-10-11 MED ORDER — ACETAMINOPHEN 325 MG PO TABS
650.0000 mg | ORAL_TABLET | ORAL | Status: DC
  Administered 2017-10-11: 650 mg via ORAL

## 2017-10-11 MED ORDER — ACETAMINOPHEN 325 MG PO TABS
ORAL_TABLET | ORAL | Status: AC
  Filled 2017-10-11: qty 2

## 2017-10-12 ENCOUNTER — Telehealth: Payer: Self-pay | Admitting: *Deleted

## 2017-10-12 MED ORDER — PREDNISONE 5 MG PO TABS: ORAL_TABLET | ORAL | 0 refills | Status: DC

## 2017-10-12 NOTE — Telephone Encounter (Signed)
Patient states she is having a flare. Patient just had her Orencia Infusion yesterday. Patient states she is having pain in her hips, ankles, elbows and wrists. Patient states she is having pain and swelling in her fingers and toes. Patient is requesting a prednisone taper. Please advise.

## 2017-10-12 NOTE — Telephone Encounter (Signed)
Please, give her  Pred taper as rxd on 06/02/17.

## 2017-10-12 NOTE — Telephone Encounter (Signed)
Patient advised that prednisone taper has been sent in.

## 2017-10-13 ENCOUNTER — Encounter (INDEPENDENT_AMBULATORY_CARE_PROVIDER_SITE_OTHER): Payer: Self-pay | Admitting: Specialist

## 2017-10-26 ENCOUNTER — Encounter (HOSPITAL_COMMUNITY): Payer: Self-pay

## 2017-11-07 ENCOUNTER — Other Ambulatory Visit: Payer: Self-pay | Admitting: *Deleted

## 2017-11-07 ENCOUNTER — Telehealth: Payer: Self-pay | Admitting: Rheumatology

## 2017-11-07 DIAGNOSIS — M0579 Rheumatoid arthritis with rheumatoid factor of multiple sites without organ or systems involvement: Secondary | ICD-10-CM

## 2017-11-07 NOTE — Telephone Encounter (Signed)
Patient called stating she is scheduled for her Orencia infusion tomorrow at 12:00.  Patient states she is scheduled for her SI joint surgery with Dr. Yevette [name] on 12/20/17 and wants to know if it is okay to go to her appointment tomorrow.

## 2017-11-07 NOTE — Telephone Encounter (Signed)
Patient advise she could receive her infusion tomorrow and then hold the July infusion.

## 2017-11-08 ENCOUNTER — Encounter (HOSPITAL_COMMUNITY)
Admission: RE | Admit: 2017-11-08 | Discharge: 2017-11-08 | Disposition: A | Discharge status: Home / Self Care | Payer: BLUE CROSS/BLUE SHIELD | Source: Ambulatory Visit | Attending: Rheumatology | Admitting: Rheumatology

## 2017-11-08 DIAGNOSIS — M0579 Rheumatoid arthritis with rheumatoid factor of multiple sites without organ or systems involvement: Secondary | ICD-10-CM | POA: Diagnosis present

## 2017-11-08 MED ORDER — ACETAMINOPHEN 325 MG PO TABS
ORAL_TABLET | ORAL | Status: AC
  Filled 2017-11-08: qty 2

## 2017-11-08 MED ORDER — ACETAMINOPHEN 325 MG PO TABS
650.0000 mg | ORAL_TABLET | ORAL | Status: DC
  Administered 2017-11-08: 650 mg via ORAL

## 2017-11-08 MED ORDER — DIPHENHYDRAMINE HCL 25 MG PO CAPS
25.0000 mg | ORAL_CAPSULE | ORAL | Status: DC
  Administered 2017-11-08: 25 mg via ORAL

## 2017-11-08 MED ORDER — DIPHENHYDRAMINE HCL 25 MG PO CAPS
ORAL_CAPSULE | ORAL | Status: AC
  Filled 2017-11-08: qty 1

## 2017-11-08 MED ORDER — SODIUM CHLORIDE 0.9 % IV SOLN
1000.0000 mg | INTRAVENOUS | Status: DC
  Administered 2017-11-08: 1000 mg via INTRAVENOUS
  Filled 2017-11-08: qty 40

## 2017-11-17 ENCOUNTER — Other Ambulatory Visit: Payer: Self-pay | Admitting: Rheumatology

## 2017-11-17 NOTE — Telephone Encounter (Signed)
Last visit: 08/19/17 Next Visit 01/24/18 Labs: 10/11/17 Albumin stable. All other labs are WNL  Okay to refill per Dr. Corliss Skains

## 2017-12-05 ENCOUNTER — Encounter (HOSPITAL_COMMUNITY): Payer: Self-pay

## 2017-12-06 ENCOUNTER — Inpatient Hospital Stay (HOSPITAL_COMMUNITY): Admission: RE | Admit: 2017-12-06 | Payer: Self-pay | Source: Ambulatory Visit

## 2017-12-14 DIAGNOSIS — E785 Hyperlipidemia, unspecified: Secondary | ICD-10-CM | POA: Insufficient documentation

## 2017-12-14 DIAGNOSIS — J309 Allergic rhinitis, unspecified: Secondary | ICD-10-CM | POA: Insufficient documentation

## 2017-12-14 DIAGNOSIS — B001 Herpesviral vesicular dermatitis: Secondary | ICD-10-CM | POA: Insufficient documentation

## 2017-12-14 DIAGNOSIS — R0981 Nasal congestion: Secondary | ICD-10-CM | POA: Insufficient documentation

## 2017-12-14 MED FILL — CETIRIZINE HCL 10 MG TABLET: 10 | 100 days supply | Qty: 100 | Fill #0

## 2017-12-14 MED FILL — ROSUVASTATIN CALCIUM 20 MG: 20 | 30 days supply | Qty: 30 | Fill #0

## 2017-12-14 MED FILL — MOMETASONE FUROATE 50 MCG S: 50 | 30 days supply | Qty: 17 | Fill #0

## 2017-12-21 ENCOUNTER — Encounter (INDEPENDENT_AMBULATORY_CARE_PROVIDER_SITE_OTHER): Payer: Self-pay | Admitting: Surgery

## 2017-12-21 ENCOUNTER — Ambulatory Visit (HOSPITAL_COMMUNITY)
Admission: RE | Admit: 2017-12-21 | Discharge: 2017-12-21 | Disposition: A | Discharge status: Home / Self Care | Payer: BLUE CROSS/BLUE SHIELD | Source: Ambulatory Visit | Attending: Rheumatology | Admitting: Rheumatology

## 2017-12-21 DIAGNOSIS — M0579 Rheumatoid arthritis with rheumatoid factor of multiple sites without organ or systems involvement: Secondary | ICD-10-CM

## 2017-12-21 LAB — COMPREHENSIVE METABOLIC PANEL
ALBUMIN: 3.1 g/dL — AB (ref 3.5–5.0)
ALK PHOS: 63 U/L (ref 38–126)
ALT: 14 U/L (ref 0–44)
AST: 17 U/L (ref 15–41)
Anion gap: 5 (ref 5–15)
BILIRUBIN TOTAL: 0.4 mg/dL (ref 0.3–1.2)
BUN: 9 mg/dL (ref 6–20)
CALCIUM: 8.7 mg/dL — AB (ref 8.9–10.3)
CO2: 30 mmol/L (ref 22–32)
Chloride: 106 mmol/L (ref 98–111)
Creatinine, Ser: 0.99 mg/dL (ref 0.44–1.00)
GFR calc non Af Amer: >60 mL/min (ref >60)
GLUCOSE: 103 mg/dL — AB (ref 70–99)
POTASSIUM: 3.3 mmol/L — AB (ref 3.5–5.1)
Sodium: 141 mmol/L (ref 135–145)
TOTAL PROTEIN: 6.4 g/dL — AB (ref 6.5–8.1)

## 2017-12-21 LAB — CBC
HEMATOCRIT: 42.3 % (ref 36.0–46.0)
Hemoglobin: 12.9 g/dL (ref 12.0–15.0)
MCH: 26.2 pg (ref 26.0–34.0)
MCHC: 30.5 g/dL (ref 30.0–36.0)
MCV: 85.8 fL (ref 78.0–100.0)
Platelets: 267 10*3/uL (ref 150–400)
RBC: 4.93 MIL/uL (ref 3.87–5.11)
RDW: 12.4 % (ref 11.5–15.5)
WBC: 10.3 10*3/uL (ref 4.0–10.5)

## 2017-12-21 MED ORDER — ACETAMINOPHEN 325 MG PO TABS
650.0000 mg | ORAL_TABLET | ORAL | Status: DC
  Administered 2017-12-21: 650 mg via ORAL

## 2017-12-21 MED ORDER — DIPHENHYDRAMINE HCL 25 MG PO CAPS
ORAL_CAPSULE | ORAL | Status: AC
  Filled 2017-12-21: qty 1

## 2017-12-21 MED ORDER — SODIUM CHLORIDE 0.9 % IV SOLN
1000.0000 mg | INTRAVENOUS | Status: DC
  Administered 2017-12-21: 1000 mg via INTRAVENOUS
  Filled 2017-12-21: qty 40

## 2017-12-21 MED ORDER — ACETAMINOPHEN 325 MG PO TABS
ORAL_TABLET | ORAL | Status: AC
  Filled 2017-12-21: qty 2

## 2017-12-21 MED ORDER — DIPHENHYDRAMINE HCL 25 MG PO CAPS
25.0000 mg | ORAL_CAPSULE | ORAL | Status: DC
  Administered 2017-12-21: 25 mg via ORAL

## 2017-12-21 NOTE — Progress Notes (Signed)
Potassium is low.  Please notify patient and fax results to her PCP.  She may need to adjust her potassium through her PCPs office.

## 2017-12-27 ENCOUNTER — Other Ambulatory Visit: Payer: Self-pay | Admitting: *Deleted

## 2017-12-27 DIAGNOSIS — Z79899 Other long term (current) drug therapy: Secondary | ICD-10-CM

## 2018-01-11 ENCOUNTER — Other Ambulatory Visit: Payer: Self-pay | Admitting: Cardiology

## 2018-01-18 ENCOUNTER — Ambulatory Visit (HOSPITAL_COMMUNITY)
Admission: RE | Admit: 2018-01-18 | Discharge: 2018-01-18 | Disposition: A | Discharge status: Home / Self Care | Payer: BLUE CROSS/BLUE SHIELD | Source: Ambulatory Visit | Attending: Rheumatology | Admitting: Rheumatology

## 2018-01-18 DIAGNOSIS — Z79899 Other long term (current) drug therapy: Secondary | ICD-10-CM | POA: Insufficient documentation

## 2018-01-18 MED ORDER — ACETAMINOPHEN 325 MG PO TABS
ORAL_TABLET | ORAL | Status: AC
  Filled 2018-01-18: qty 2

## 2018-01-18 MED ORDER — DIPHENHYDRAMINE HCL 25 MG PO CAPS
25.0000 mg | ORAL_CAPSULE | ORAL | Status: DC
  Administered 2018-01-18: 25 mg via ORAL

## 2018-01-18 MED ORDER — SODIUM CHLORIDE 0.9 % IV SOLN
1000.0000 mg | INTRAVENOUS | Status: DC
  Administered 2018-01-18: 1000 mg via INTRAVENOUS
  Filled 2018-01-18: qty 40

## 2018-01-18 MED ORDER — DIPHENHYDRAMINE HCL 25 MG PO CAPS
ORAL_CAPSULE | ORAL | Status: AC
  Filled 2018-01-18: qty 1

## 2018-01-18 MED ORDER — ACETAMINOPHEN 325 MG PO TABS
650.0000 mg | ORAL_TABLET | ORAL | Status: DC
  Administered 2018-01-18: 650 mg via ORAL

## 2018-01-23 ENCOUNTER — Encounter (HOSPITAL_BASED_OUTPATIENT_CLINIC_OR_DEPARTMENT_OTHER): Payer: Self-pay | Admitting: *Deleted

## 2018-01-23 ENCOUNTER — Other Ambulatory Visit: Payer: Self-pay | Admitting: Cardiology

## 2018-01-23 ENCOUNTER — Other Ambulatory Visit: Payer: Self-pay

## 2018-01-23 ENCOUNTER — Emergency Department (HOSPITAL_BASED_OUTPATIENT_CLINIC_OR_DEPARTMENT_OTHER)
Admission: EM | Admit: 2018-01-23 | Discharge: 2018-01-23 | Disposition: A | Discharge status: Home / Self Care | Payer: BLUE CROSS/BLUE SHIELD | Attending: Emergency Medicine | Admitting: Emergency Medicine

## 2018-01-23 DIAGNOSIS — Z79899 Other long term (current) drug therapy: Secondary | ICD-10-CM | POA: Insufficient documentation

## 2018-01-23 DIAGNOSIS — I2510 Atherosclerotic heart disease of native coronary artery without angina pectoris: Secondary | ICD-10-CM | POA: Diagnosis not present

## 2018-01-23 DIAGNOSIS — G8929 Other chronic pain: Secondary | ICD-10-CM | POA: Insufficient documentation

## 2018-01-23 DIAGNOSIS — M545 Low back pain: Secondary | ICD-10-CM | POA: Diagnosis present

## 2018-01-23 DIAGNOSIS — M533 Sacrococcygeal disorders, not elsewhere classified: Secondary | ICD-10-CM

## 2018-01-23 DIAGNOSIS — M5441 Lumbago with sciatica, right side: Secondary | ICD-10-CM | POA: Diagnosis not present

## 2018-01-23 DIAGNOSIS — M461 Sacroiliitis, not elsewhere classified: Secondary | ICD-10-CM | POA: Diagnosis not present

## 2018-01-23 DIAGNOSIS — I10 Essential (primary) hypertension: Secondary | ICD-10-CM | POA: Insufficient documentation

## 2018-01-23 MED ORDER — HYDROCODONE-ACETAMINOPHEN 5-325 MG PO TABS: 1.0000 | ORAL_TABLET | ORAL | 0 refills | Status: DC | PRN

## 2018-01-23 MED ORDER — LIDOCAINE 5 % EX PTCH: 1.0000 | MEDICATED_PATCH | CUTANEOUS | 0 refills | Status: DC

## 2018-01-23 MED ORDER — HYDROCODONE-ACETAMINOPHEN 5-325 MG PO TABS
1.0000 | ORAL_TABLET | Freq: Once | ORAL | Status: AC
  Administered 2018-01-23: 1 via ORAL
  Filled 2018-01-23: qty 1

## 2018-01-23 MED ORDER — KETOROLAC TROMETHAMINE 60 MG/2ML IM SOLN
60.0000 mg | Freq: Once | INTRAMUSCULAR | Status: AC
  Administered 2018-01-23: 60 mg via INTRAMUSCULAR
  Filled 2018-01-23: qty 2

## 2018-01-23 MED FILL — LIDOCAINE PATCH 5%: 5 | 14 days supply | Qty: 14 | Fill #0

## 2018-01-23 MED FILL — HYDROCODON-APAP 5-325: 5-325 | 2 days supply | Qty: 10 | Fill #0

## 2018-01-23 NOTE — Progress Notes (Deleted)
Office Visit Note  Patient: [name] [name]             Date of Birth: [birth year]           MRN: [id number]             PCP: [name], MD Referring: [name], MD Visit Date: 01/24/2018 Occupation: @GUAROCC @  Subjective:  No chief complaint on file.   History of Present Illness: [name] [name] is a 52 y.o. female ***   Activities of Daily Living:  Patient reports morning stiffness for *** {minute/hour:19697}.   Patient {ACTIONS;DENIES/REPORTS:21021675::"Denies"} nocturnal pain.  Difficulty dressing/grooming: {ACTIONS;DENIES/REPORTS:21021675::"Denies"} Difficulty climbing stairs: {ACTIONS;DENIES/REPORTS:21021675::"Denies"} Difficulty getting out of chair: {ACTIONS;DENIES/REPORTS:21021675::"Denies"} Difficulty using hands for taps, buttons, cutlery, and/or writing: {ACTIONS;DENIES/REPORTS:21021675::"Denies"}  No Rheumatology ROS completed.   PMFS History:  Patient Active Problem List   Diagnosis Date Noted  . High risk medication use 08/03/2016  . Fibromyalgia syndrome 08/03/2016  . Abnormal cardiac CT angiography   . Sacrococcygeal pain 02/20/2016  . Rheumatoid arthritis involving 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HCC) 01/24/2016  . 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 Dyslipidemia 01/30/2015  . 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 Essential hypertension 08/25/2014  . Morbid obesity (HCC) 08/25/2014  . CAD (coronary artery disease), native coronary artery 08/24/2014  . Numbness and tingling in left arm   . Arm numbness left 08/22/2014  . Chest pain 08/22/2014  . Gastroesophageal reflux disease without esophagitis 07/23/2014  . Coronary artery calcification seen on CAT scan 02/19/2014  . Hip pain 10/23/2013  . Solitary pulmonary nodule 08/28/2013  . Chest pain, atypical 07/18/2013  . Heart palpitations 06/27/2013  . Cough 06/04/2013  . Diastolic dysfunction 05/18/2013  . SOB (shortness of breath) 05/09/2013   . Other malaise and fatigue 01/21/2013  . Stress and adjustment reaction 12/09/2012  . Right sided abdominal pain 11/29/2012  . 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/21/2012  . Morbid obesity with BMI of 50.0-59.9, adult (HCC) 11/21/2012  . Arthritis 07/13/2012  . Depression 07/13/2012  . Routine health maintenance 05/07/2012    Past Medical History:  Diagnosis Date  . Anxiety   . Coronary artery calcification seen on CAT scan    minimal CAD with 30% prox and mild LAD  . Depression   . Diastolic dysfunction   . Dyslipidemia 01/30/2015  . Esophageal ring   . Fibromyalgia   . Hiatal hernia   . HSV-1 infection   . Morbid obesity (HCC)   . Rheumatoid arthritis(714.0)    M05.79  . Rheumatoid arthritis, seropositive, 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HCC)    Treated with Orencia, TB neg 09/12/2015    Family History  Problem Relation Age of Onset  . Hyperlipidemia Mother   . Depression Mother   . Sarcoidosis Father   . Lung disease Father        Pleural Mesothelioma  . Cancer Father   . Heart attack Paternal Grandmother   . Hypertension Paternal Grandmother   . Hypertension Maternal Grandmother   . Diabetes Maternal Grandmother   . Stroke Neg Hx   . Colon cancer Neg Hx   . Esophageal cancer Neg Hx   . Rectal cancer Neg Hx   . Stomach cancer Neg Hx    Past Surgical History:  Procedure Laterality Date  . CARDIAC CATHETERIZATION N/A 06/11/2016   Procedure: Left Heart Cath and Coronary Angiography;  Surgeon: 08/09/2016, MD;  Location: [facility] INVASIVE CV LAB;  Service: Cardiovascular;  Laterality: N/A;  .  CESAREAN SECTION    . COLONOSCOPY  07/27/2017  . ESOPHAGEAL MANOMETRY N/A 10/28/2014   Procedure: ESOPHAGEAL MANOMETRY (EM);  Surgeon: Hilarie Fredrickson, MD;  Location: [facility] ENDOSCOPY;  Service: Endoscopy;  Laterality: N/A;  . HERNIA REPAIR     reports surgery on 3 hernias, with 2 more present  . LAPAROSCOPIC GASTRIC SLEEVE RESECTION  11/21/12   [facility]  . LEFT HEART  CATHETERIZATION WITH CORONARY ANGIOGRAM N/A 08/26/2014   Procedure: LEFT HEART CATHETERIZATION WITH CORONARY ANGIOGRAM;  Surgeon: Runell Gess, MD;  Location: [facility] CATH LAB;  Service: Cardiovascular;  Laterality: N/A;  . TUBAL LIGATION     Social History   Social History Narrative   Marital Status: Married Probation officer)    Children:  Son [name]) Daughter [name])    Pets: None    Living Situation: Lives with husband and children    Occupation: Occupational psychologist Administrator)     Education: Oncologist (Psychology)     Tobacco Use/Exposure:  None    Alcohol Use:  Occasional   Drug Use:  None   Diet:  Regular   Exercise: Walking or Treadmill (2 x week)   Hobbies: Clinical cytogeneticist, [employer].                Objective: Vital Signs: There were no vitals taken for this visit.   Physical Exam   Musculoskeletal Exam: ***  CDAI Exam: CDAI Score: Not documented Patient Global Assessment: Not documented; Provider Global Assessment: Not documented Swollen: Not documented; Tender: Not documented Joint Exam   Not documented   There is currently no information documented on the homunculus. Go to the Rheumatology activity and complete the homunculus joint exam.  Investigation: No additional findings.  Imaging: No results found.  Recent Labs: Lab Results  Component Value Date   WBC 10.3 12/21/2017   HGB 12.9 12/21/2017   PLT 267 12/21/2017   NA 141 12/21/2017   K 3.3 (L) 12/21/2017   CL 106 12/21/2017   CO2 30 12/21/2017   GLUCOSE 103 (H) 12/21/2017   BUN 9 12/21/2017   CREATININE 0.99 12/21/2017   BILITOT 0.4 12/21/2017   ALKPHOS 63 12/21/2017   AST 17 12/21/2017   ALT 14 12/21/2017   PROT 6.4 (L) 12/21/2017   ALBUMIN 3.1 (L) 12/21/2017   CALCIUM 8.7 (L) 12/21/2017   GFRAA >60 12/21/2017   QFTBGOLD Negative 09/01/2016   QFTBGOLDPLUS Negative 08/31/2017    Speciality Comments: ORENCIA 1000 mg x 4 weeks TB GOLD negative  09/01/16  Procedures:  No procedures performed Allergies: Influenza vaccines and Isosorbide   Assessment / Plan:     Visit Diagnoses: No diagnosis found.   Orders: No orders of the defined types were placed in this encounter.  No orders of the defined types were placed in this encounter.   Face-to-face time spent with patient was *** minutes. Greater than 50% of time was spent in counseling and coordination of care.  Follow-Up Instructions: No follow-ups on file.   Ellen Henri, CMA  Note - This record has been created using Animal nutritionist.  Chart creation errors have been sought, but may not always  have been located. Such creation errors do not reflect on  the standard of medical care.

## 2018-01-23 NOTE — Discharge Instructions (Signed)
Your exam was consistent with continued sacroiliac joint pain and inflammation.  Your pain improved with the anti-inflammatory injection and the pain medication, we feel safe discharging you to follow-up with your orthopedic surgeon.  Please use the pain medicine and the new patches to help with the pain.  If any symptoms change or worsen, please return to the nearest emergency department.

## 2018-01-23 NOTE — ED Provider Notes (Signed)
[facility] EMERGENCY DEPARTMENT Provider Note   CSN: [id number] Arrival date & time: 01/23/18  1321     History   Chief Complaint Chief Complaint  Patient presents with  . Hip Pain    HPI [name] [name] is a 52 y.o. female.  The history is provided by the patient and medical records. No language interpreter was used.  Back Pain   This is a chronic problem. The current episode started more than 1 week ago. The problem occurs constantly. The problem has been gradually worsening. The pain is associated with no known injury. The pain is present in the sacro-iliac joint. The quality of the pain is described as stabbing, shooting and aching. The pain radiates to the right knee, right thigh and right foot. The pain is at a severity of 8/10. The pain is severe. The symptoms are aggravated by bending. The pain is the same all the time. Pertinent negatives include no chest pain, no fever, no numbness, no headaches, no abdominal pain, no perianal numbness, no bladder incontinence, no dysuria, no pelvic pain, no paresthesias, no paresis, no tingling and no weakness. She has tried muscle relaxants, ice, heat, analgesics and NSAIDs for the symptoms. The treatment provided mild relief. Risk factors include obesity.    Past Medical History:  Diagnosis Date  . Anxiety   . Coronary artery calcification seen on CAT scan    minimal CAD with 30% prox and mild LAD  . Depression   . Diastolic dysfunction   . Dyslipidemia 01/30/2015  . Esophageal ring   . Fibromyalgia   . Hiatal hernia   . HSV-1 infection   . Morbid obesity (HCC)   . Rheumatoid arthritis(714.0)    M05.79  . Rheumatoid arthritis, seropositive, 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HCC)    Treated with Orencia, TB neg 09/12/2015    Patient Active Problem List   Diagnosis Date Noted  . High risk medication use 08/03/2016  . Fibromyalgia syndrome 08/03/2016  . Abnormal cardiac CT angiography   . Sacrococcygeal pain 02/20/2016  .  Rheumatoid arthritis involving 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HCC) 01/24/2016  . 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 Dyslipidemia 01/30/2015  . 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 Essential hypertension 08/25/2014  . Morbid obesity (HCC) 08/25/2014  . CAD (coronary artery disease), native coronary artery 08/24/2014  . Numbness and tingling in left arm   . Arm numbness left 08/22/2014  . Chest pain 08/22/2014  . Gastroesophageal reflux disease without esophagitis 07/23/2014  . Coronary artery calcification seen on CAT scan 02/19/2014  . Hip pain 10/23/2013  . Solitary pulmonary nodule 08/28/2013  . Chest pain, atypical 07/18/2013  . Heart palpitations 06/27/2013  . Cough 06/04/2013  . Diastolic dysfunction 05/18/2013  . SOB (shortness of breath) 05/09/2013  . Other malaise and fatigue 01/21/2013  . Stress and adjustment reaction 12/09/2012  . Right sided abdominal pain 11/29/2012  . 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/21/2012  . Morbid obesity with BMI of 50.0-59.9, adult (HCC) 11/21/2012  . Arthritis 07/13/2012  . Depression 07/13/2012  . Routine health maintenance 05/07/2012    Past Surgical History:  Procedure Laterality Date  . CARDIAC CATHETERIZATION N/A 06/11/2016   Procedure: Left Heart Cath and Coronary Angiography;  Surgeon: Corky Crafts, MD;  Location: [facility] INVASIVE CV LAB;  Service: Cardiovascular;  Laterality: N/A;  . CESAREAN SECTION    . COLONOSCOPY  07/27/2017  . ESOPHAGEAL MANOMETRY N/A 10/28/2014   Procedure: ESOPHAGEAL MANOMETRY (EM);  Surgeon:  Hilarie Fredrickson, MD;  Location: [facility] ENDOSCOPY;  Service: Endoscopy;  Laterality: N/A;  . HERNIA REPAIR     reports surgery on 3 hernias, with 2 more present  . LAPAROSCOPIC GASTRIC SLEEVE RESECTION  11/21/12   [facility]  . LEFT HEART CATHETERIZATION WITH CORONARY ANGIOGRAM N/A 08/26/2014   Procedure: LEFT HEART CATHETERIZATION WITH CORONARY ANGIOGRAM;  Surgeon:  Runell Gess, MD;  Location: [facility] CATH LAB;  Service: Cardiovascular;  Laterality: N/A;  . TUBAL LIGATION       OB History   None      Home Medications    Prior to Admission medications   Medication Sig Start Date End Date Taking? Authorizing Provider  Abatacept (ORENCIA IV) Inject 1,000 mg into the vein every 28 (twenty-eight) days.     [provider]  Adapalene 0.3 % gel Apply 1 application topically daily as needed (acne).  12/31/14   [provider]  amitriptyline (ELAVIL) 10 MG tablet Take 10 mg by mouth at bedtime.    [provider]  ARIPiprazole (ABILIFY) 2 MG tablet Take 1 tablet (2 mg total) by mouth daily. 04/22/15   Benjaman Pott, MD  aspirin EC 81 MG tablet Take 81 mg by mouth daily.    [provider]  atorvastatin (LIPITOR) 80 MG tablet Take 1 tablet (80 mg total) by mouth daily at 6 PM. 05/20/16   Simmons, Brittainy M, PA-C  B-D TB SYRINGE 1CC/27GX1/2" 27G X 1/2" 1 ML MISC TO USE WITH INJECTABLE METHOTREXATE ONCE A WEEK 07/04/17   Pollyann Savoy, MD  Betamethasone Valerate 0.12 % foam Apply 1 application topically daily as needed. Itching 04/22/15   [provider]  buPROPion (WELLBUTRIN XL) 300 MG 24 hr tablet Take 1 tablet (300 mg total) by mouth daily. 12/28/14   Adonis Brook, NP  clindamycin-benzoyl peroxide (BENZACLIN) gel Apply 1 application topically daily as needed. Acne 02/13/15   [provider]  clobetasol cream (TEMOVATE) 0.05 % Apply 1 application topically daily as needed. Skin discoloration 04/22/15   [provider]  cyclobenzaprine (FLEXERIL) 10 MG tablet TAKE 1 TABLET BY MOUTH EVERY 6 TO 8 HOURS AS NEEDED MUSCLE SPASMS 09/07/16   Kerrin Champagne, MD  cyclobenzaprine (FLEXERIL) 10 MG tablet TAKE 1 TABLET EVERY 6-8HRS AS NEEDED FOR MUSCLE SPASMS 04/18/17   Kerrin Champagne, MD  diazepam (VALIUM) 2 MG tablet Take 1 tablet 1 hour prior to scan, and may repeat right before scan if needed.  MUST  HAVE A DRIVER. 02/24/09   Naida Sleight, PA-C  diclofenac sodium (VOLTAREN) 1 % GEL APPLY 3 G TO 3 LARGE JOINTS, UP TO 3 TIMES DAILY AS NEEDED. 09/19/17   Pollyann Savoy, MD  diltiazem (CARDIZEM CD) 180 MG 24 hr capsule Take 1 capsule (180 mg total) by mouth daily. Please make yearly appt with Dr. Mayford Knife for January. 1st attempt 06/13/17   Quintella Reichert, MD  doxepin (SINEQUAN) 10 MG capsule Take 10 mg by mouth at bedtime as needed (sleep).    [provider]  EPIPEN 2-PAK 0.3 MG/0.3ML SOAJ injection Inject 0.3 mg into the skin as needed (allergic reaction).  12/30/14   [provider]  famciclovir (FAMVIR) 500 MG tablet Take 1500mg  on the onset of fever blister for 2 or 3 days as needed for fever blisters 01/21/15   [provider]  FLUoxetine (PROZAC) 10 MG capsule Take 10 mg by mouth daily.    [provider]  fluticasone (FLONASE) 50  MCG/ACT nasal spray Place 2 sprays into both nostrils as needed for allergies. 12/28/14 08/21/17  Adonis Brook, NP  Folic Acid-Vit B6-Vit B12 (FABB) 2.2-25-1 MG TABS Take 1 tablet by mouth daily. 11/11/16   Panwala, Naitik, PA-C  gabapentin (NEURONTIN) 100 MG capsule TAKE 1 CAPSULE BY MOUTH THREE TIMES A DAY 09/20/17   Pollyann Savoy, MD  hydrOXYzine (VISTARIL) 25 MG capsule Take 25 mg by mouth every 8 (eight) hours as needed for anxiety.  12/30/14   [provider]  levothyroxine (SYNTHROID, LEVOTHROID) 25 MCG tablet Take 1 tablet (25 mcg total) by mouth daily before breakfast. 12/28/14   Adonis Brook, NP  lidocaine (LIDODERM) 5 % Place 1 patch onto the skin daily as needed (pain). Hip 11/11/16   Panwala, Naitik, PA-C  Methotrexate, Anti-Rheumatic, (OTREXUP [place]) Inject into the skin.    [provider]  metoprolol tartrate (LOPRESSOR) 25 MG tablet Take 1 tablet (25 mg total) by mouth 2 (two) times daily. Patient not taking: Reported on 08/05/2016 06/04/16 09/02/16  Manson Passey, PA  nitroGLYCERIN (NITROSTAT)  0.4 MG SL tablet PLACE 1 TABLET UNDER THE TONGUE EVERY 5 MINUTES AS NEEDED FOR CHEST PAIN. 09/06/16   Quintella Reichert, MD  pantoprazole (PROTONIX) 40 MG tablet TAKE 1 TABLET (40 MG TOTAL) BY MOUTH 2 (TWO) TIMES DAILY. 02/03/16   Esterwood, Amy S, PA-C  predniSONE (DELTASONE) 5 MG tablet Take 20mg  a day for 4 days, then 15mg  a day for 4 days, then 10mg  a day for 4 days, then 5mg  a day for 4 days then stop. 10/12/17   Pollyann Savoy, MD  promethazine-dextromethorphan (PROMETHAZINE-DM) 6.25-15 MG/5ML syrup Take 5 mLs by mouth 4 (four) times daily as needed for cough. 05/13/17   Fayrene Helper, PA-C  RASUVO 25 MG/0.5ML SOAJ INJECT ONE PEN SUBCUTANEOUSLY ONCE EVERY WEEK. STORE AT ROOM TEMPERATURE BETWEEN 68 - 77 DEGREES F. 11/17/17   Pollyann Savoy, MD  rosuvastatin (CRESTOR) 20 MG tablet Take 20 mg by mouth. 09/21/16 09/21/17  [provider]  tretinoin (RETIN-A) 0.025 % cream Apply 1 application topically daily. 02/11/15   [provider]  triamcinolone cream (KENALOG) 0.1 % Apply 1 application topically daily as needed. Itching 04/13/15   [provider]  Tuberculin-Allergy Syringes 28G X 1/2" 1 ML MISC To use with injectable methotrexate once a week 04/28/16   Pollyann Savoy, MD  XERESE 5-1 % CREA Apply 1 application topically daily as needed (fever blisters).  12/30/14   [provider]    Family History Family History  Problem Relation Age of Onset  . Hyperlipidemia Mother   . Depression Mother   . Sarcoidosis Father   . Lung disease Father        Pleural Mesothelioma  . Cancer Father   . Heart attack Paternal Grandmother   . Hypertension Paternal Grandmother   . Hypertension Maternal Grandmother   . Diabetes Maternal Grandmother   . Stroke Neg Hx   . Colon cancer Neg Hx   . Esophageal cancer Neg Hx   . Rectal cancer Neg Hx   . Stomach cancer Neg Hx     Social History Social History   Tobacco Use  . Smoking status: Never Smoker  . Smokeless tobacco:  Never Used  Substance Use Topics  . Alcohol use: No  . Drug use: No     Allergies   Influenza vaccines and Isosorbide   Review of Systems Review of Systems  Constitutional: Negative for chills, fatigue and fever.  HENT: Negative  for ear pain and sore throat.   Eyes: Negative for pain and visual disturbance.  Respiratory: Negative for cough and shortness of breath.   Cardiovascular: Negative for chest pain and palpitations.  Gastrointestinal: Negative for abdominal pain, constipation, diarrhea, nausea and vomiting.  Genitourinary: Negative for bladder incontinence, dysuria, flank pain, frequency, hematuria and pelvic pain.  Musculoskeletal: Positive for back pain. Negative for arthralgias, neck pain and neck stiffness.  Skin: Negative for color change, rash and wound.  Neurological: Negative for dizziness, tingling, tremors, seizures, syncope, weakness, numbness, headaches and paresthesias.  All other systems reviewed and are negative.    Physical Exam Updated Vital Signs BP 125/75   Pulse 87   Temp 97.9 F (36.6 C)   Resp 18   Ht 5\' 2"  (1.575 m)   Wt (!) 173.5 kg   SpO2 98%   BMI 69.96 kg/m   Physical Exam  Constitutional: She is oriented to person, place, and time. She appears well-developed and well-nourished. No distress.  HENT:  Head: Normocephalic and atraumatic.  Mouth/Throat: Oropharynx is clear and moist. No oropharyngeal exudate.  Eyes: Pupils are equal, round, and reactive to light. Conjunctivae and EOM are normal.  Neck: Normal range of motion. Neck supple.  Cardiovascular: Normal rate and regular rhythm.  No murmur heard. Pulmonary/Chest: Effort normal and breath sounds normal. No respiratory distress. She has no wheezes. She has no rales. She exhibits no tenderness.  Abdominal: Soft. There is no tenderness.  Musculoskeletal: She exhibits tenderness. She exhibits no edema or deformity.       Right hip: She exhibits tenderness.       Lumbar back: She  exhibits tenderness and pain.       Back:       Legs: Normal strength and sensation in bilateral lower extremities.  Normal pulses and cap refill present in legs.  Numbness in right hip and right SI area.  Midline back tenderness.  Neurological: She is alert and oriented to person, place, and time. A cranial nerve deficit is present. No sensory deficit. She exhibits normal muscle tone.  Skin: Skin is warm and dry. Capillary refill takes less than 2 seconds. No rash noted. She is not diaphoretic. No erythema.  Psychiatric: She has a normal mood and affect.  Nursing note and vitals reviewed.    ED Treatments / Results  Labs (all labs ordered are listed, but only abnormal results are displayed) Labs Reviewed - No data to display  EKG None  Radiology No results found.  Procedures Procedures (including critical care time)  Medications Ordered in ED Medications  ketorolac (TORADOL) injection 60 mg (60 mg Intramuscular Given 01/23/18 1535)  HYDROcodone-acetaminophen (NORCO/VICODIN) 5-325 MG per tablet 1 tablet (1 tablet Oral Given 01/23/18 1534)     Initial Impression / Assessment and Plan / ED Course  I have reviewed the triage vital signs and the nursing notes.  Pertinent labs & imaging results that were available during my care of the patient were reviewed by me and considered in my medical decision making (see chart for details).     [name] [name] is a 52 y.o. female with a past medical history significant for diastolic dysfunction, CAD, morbid obesity, hypertension, fibromyalgia, depression, and chronic back/hip pains who presents with right back pain.  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  She reports that something happened with her insurance and it was not approved.  Patient reports is  in the process of getting it reapproved.  She says that all of her medications or time for the surgery  and now she is having uncontrolled right hip and back pain.  She denies any new traumas or falls.  She denies any fevers, chills, cough, congestion, dysuria, loss of bladder control, or loss of bowel movement control.  She reports that they have done injections in the past and did biopsies confirming the sacroiliitis.  She says that she ran out of her Lidoderm patches which helped her somewhat in the past but reports the pain is an 8 out of 10 and she can barely ambulate currently.  On exam, patient has right hip and SI joint tenderness.  No midline back tenderness.  No significant discrepancy between leg swelling bilaterally.  Normal sensation and strength in legs.  Range of motion limited by pain in the right hip.  Lungs clear chest is nontender.  Abdomen nontender.  Clinically I suspect patient is having uncontrolled pain due to the absence of the medications which were out due to the surgery being rescheduled.  Although I suspect her pain is likely exacerbation of chronic pain and our policy is not to provide narcotic pain medications for this, given the circumstances of the surgery problem, I do feel comfortable giving the patient injection of Toradol as well as a hydrocodone pill today.  I discussed with the patient about getting a short prescription of hydrocodone until she can touch base with her orthopedic surgeon to reestablish the surgery and outpatient pain regimen.  Patient also given prescription for Lidoderm patches as this helped her and is targeted at the [facility] joint.  Anticipate reassessment after medications.  I do not feel patient needs blood work or imaging at this time given the lack of changes.  Anticipate discharge after pain is under better control.  5:16 PM Patient was reassessed and was able to ambulate.  She reports her pain was feeling better.  She had no numbness or tingling otherwise.  Given patient improvement I feel complete giving patient a refill of her Lidoderm patches  and a short course of oral pain medication.  She will call her orthopedic surgeon today or tomorrow to discuss further management.  Patient understood return precautions and was discharged in good condition with improved symptoms.  Final Clinical Impressions(s) / ED Diagnoses   Final diagnoses:  SI (sacroiliac) pain  Acute right-sided low back pain with right-sided sciatica    ED Discharge Orders         Ordered    HYDROcodone-acetaminophen (NORCO/VICODIN) 5-325 MG tablet  Every 4 hours PRN     01/23/18 1719    lidocaine (LIDODERM) 5 %  Every 24 hours     01/23/18 1719          Clinical Impression: 1. SI (sacroiliac) pain   2. Acute right-sided low back pain with right-sided sciatica     Disposition: Discharge  Condition: Good  I have discussed the results, Dx and Tx plan with the pt(& family if present). He/she/they expressed understanding and agree(s) with the plan. Discharge instructions discussed at great length. Strict return precautions discussed and pt &/or family have verbalized understanding of the instructions. No further questions at time of discharge.    New Prescriptions   HYDROCODONE-ACETAMINOPHEN (NORCO/VICODIN) 5-325 MG TABLET    Take 1 tablet by mouth every 4 (four) hours as needed.   LIDOCAINE (LIDODERM) 5 %    Place 1 patch onto the skin daily. Remove &  Discard patch within 12 hours or as directed by MD    Follow Up: [name], MD [address] [place] [address] [phone]     [facility] [place] [facility] [address] [id number] [address] [place] [address] [phone]        Demeka Sutter, Canary Brim, MD 01/23/18 1719

## 2018-01-23 NOTE — ED Triage Notes (Signed)
Pt c/o chronic  right hip pain , states " I need pain  Control"

## 2018-01-23 NOTE — ED Notes (Signed)
Pt amb to [facility]

## 2018-01-24 ENCOUNTER — Ambulatory Visit: Payer: BLUE CROSS/BLUE SHIELD | Admitting: Rheumatology

## 2018-01-31 ENCOUNTER — Other Ambulatory Visit: Payer: Self-pay | Admitting: *Deleted

## 2018-01-31 NOTE — Progress Notes (Signed)
Infusion orders are current for patient CBC CMP Tylenol Benadryl appointments are up to date and follow up appointment  is scheduled TB gold not due yet.  

## 2018-02-01 ENCOUNTER — Telehealth: Payer: Self-pay | Admitting: Rheumatology

## 2018-02-01 NOTE — Telephone Encounter (Signed)
Patient request a call. She went to the hospital last week due to increased pain with RA. Patient got a pain medication, and shot. Patient not able to get pain under control. Please call to discuss.

## 2018-02-01 NOTE — Progress Notes (Signed)
Office Visit Note  Patient: [name] [name]             Date of Birth: [birth year]           MRN: [id number]             PCP: [name], MD Referring: [name], MD Visit Date: 02/02/2018 Occupation: @GUAROCC @  Subjective:  Generalized pain   History of Present Illness: [name] [name] is a 52 y.o. female with history of seropositive rheumatoid arthritis, osteoarthritis, fibromyalgia, and trochanteric bursitis.  Patient is on Orencia IV infusions, methotrexate 1 mL subcutaneously once weekly and folic acid 2 mg by mouth daily.  Patient reports that her last infusion was 2 weeks ago.  She states that she is having pain in multiple joints including bilateral ankles and bilateral knee joints.  She states that she is also having bilateral SI joint pain and right-sided sciatica.  She states that her surgeon is currently going through the appeal process for SI joint fusion which her insurance company denied.  She reports that her fibromyalgia has been flaring for the past week.  She has generalized muscle aches and muscle tenderness.  She is also been having worsening fatigue and insomnia.  She states that she has not slept well for the past 3 nights.  She states that the weather changes have  Increased the frequency of fibromyalgia flares.    Activities of Daily Living:  Patient reports morning stiffness for 2 hours.   Patient Reports nocturnal pain.  Difficulty dressing/grooming: Denies Difficulty climbing stairs: Reports Difficulty getting out of chair: Reports Difficulty using hands for taps, buttons, cutlery, and/or writing: Reports  Review of Systems  Constitutional: Positive for fatigue.  HENT: Negative for mouth sores, mouth dryness and nose dryness.   Eyes: Negative for pain, visual disturbance and dryness.  Respiratory: Negative for cough, hemoptysis, shortness of breath and difficulty breathing.   Cardiovascular: Negative for chest pain, palpitations,  hypertension and swelling in legs/feet.  Gastrointestinal: Positive for constipation. Negative for blood in stool and diarrhea.  Endocrine: Negative for increased urination.  Genitourinary: Negative for painful urination.  Musculoskeletal: Positive for arthralgias, joint pain, joint swelling, myalgias, morning stiffness, muscle tenderness and myalgias. Negative for muscle weakness.  Skin: Negative for color change, pallor, rash, hair loss, nodules/bumps, skin tightness, ulcers and sensitivity to sunlight.  Allergic/Immunologic: Negative for susceptible to infections.  Neurological: Negative for dizziness, numbness, headaches and weakness.  Hematological: Negative for swollen glands.  Psychiatric/Behavioral: Positive for depressed mood and sleep disturbance. The patient is nervous/anxious.     PMFS History:  Patient Active Problem List   Diagnosis Date Noted  . High risk medication use 08/03/2016  . Fibromyalgia syndrome 08/03/2016  . Abnormal cardiac CT angiography   . Sacrococcygeal pain 02/20/2016  . Rheumatoid arthritis involving 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HCC) 01/24/2016  . 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 Dyslipidemia 01/30/2015  . 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 Essential hypertension 08/25/2014  . Morbid obesity (HCC) 08/25/2014  . CAD (coronary artery disease), native coronary artery 08/24/2014  . Numbness and tingling in left arm   . Arm numbness left 08/22/2014  . Chest pain 08/22/2014  . Gastroesophageal reflux disease without esophagitis 07/23/2014  . Coronary artery calcification seen on CAT scan 02/19/2014  . Hip pain 10/23/2013  . Solitary pulmonary nodule 08/28/2013  . Chest pain, atypical 07/18/2013  . Heart palpitations 06/27/2013  . Cough 06/04/2013  . Diastolic dysfunction  05/18/2013  . SOB (shortness of breath) 05/09/2013  . Other malaise and fatigue 01/21/2013  . Stress and adjustment reaction 12/09/2012    . Right sided abdominal pain 11/29/2012  . 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/21/2012  . Morbid obesity with BMI of 50.0-59.9, adult (HCC) 11/21/2012  . Arthritis 07/13/2012  . Depression 07/13/2012  . Routine health maintenance 05/07/2012    Past Medical History:  Diagnosis Date  . Anxiety   . Coronary artery calcification seen on CAT scan    minimal CAD with 30% prox and mild LAD  . Depression   . Diastolic dysfunction   . Dyslipidemia 01/30/2015  . Esophageal ring   . Fibromyalgia   . Hiatal hernia   . HSV-1 infection   . Morbid obesity (HCC)   . Rheumatoid arthritis(714.0)    M05.79  . Rheumatoid arthritis, seropositive, 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HCC)    Treated with Orencia, TB neg 09/12/2015    Family History  Problem Relation Age of Onset  . Hyperlipidemia Mother   . Depression Mother   . Sarcoidosis Father   . Lung disease Father        Pleural Mesothelioma  . Cancer Father   . Heart attack Paternal Grandmother   . Hypertension Paternal Grandmother   . Hypertension Maternal Grandmother   . Diabetes Maternal Grandmother   . Stroke Neg Hx   . Colon cancer Neg Hx   . Esophageal cancer Neg Hx   . Rectal cancer Neg Hx   . Stomach cancer Neg Hx    Past Surgical History:  Procedure Laterality Date  . CARDIAC CATHETERIZATION N/A 06/11/2016   Procedure: Left Heart Cath and Coronary Angiography;  Surgeon: Corky Crafts, MD;  Location: [facility] INVASIVE CV LAB;  Service: Cardiovascular;  Laterality: N/A;  . CESAREAN SECTION    . COLONOSCOPY  07/27/2017  . ESOPHAGEAL MANOMETRY N/A 10/28/2014   Procedure: ESOPHAGEAL MANOMETRY (EM);  Surgeon: Hilarie Fredrickson, MD;  Location: [facility] ENDOSCOPY;  Service: Endoscopy;  Laterality: N/A;  . HERNIA REPAIR     reports surgery on 3 hernias, with 2 more present  . LAPAROSCOPIC GASTRIC SLEEVE RESECTION  11/21/12   [facility]  . LEFT HEART CATHETERIZATION WITH CORONARY ANGIOGRAM N/A 08/26/2014   Procedure: LEFT HEART CATHETERIZATION WITH  CORONARY ANGIOGRAM;  Surgeon: Runell Gess, MD;  Location: [facility] CATH LAB;  Service: Cardiovascular;  Laterality: N/A;  . TUBAL LIGATION     Social History   Social History Narrative   Marital Status: Married Probation officer)    Children:  Son [name]) Daughter [name])    Pets: None    Living Situation: Lives with husband and children    Occupation: Occupational psychologist Administrator)     Education: Oncologist (Psychology)     Tobacco Use/Exposure:  None    Alcohol Use:  Occasional   Drug Use:  None   Diet:  Regular   Exercise: Walking or Treadmill (2 x week)   Hobbies: Clinical cytogeneticist, [employer].                Objective: Vital Signs: BP 126/86 (BP Location: Left Arm, Patient Position: Sitting, Cuff Size: Large)   Pulse 93   Resp 16   Ht 5\' 2"  (1.575 m)   Wt 269 lb 12.8 oz (122.4 kg)   BMI 49.35 kg/m    Physical Exam  Constitutional: She is oriented to person, place, and time. She appears well-developed and well-nourished.  HENT:  Head: Normocephalic and atraumatic.  Eyes: Conjunctivae and EOM are normal.  Neck: Normal range of motion.  Cardiovascular: Normal rate, regular rhythm, normal heart sounds and intact distal pulses.  Pulmonary/Chest: Effort normal and breath sounds normal.  Abdominal: Soft. Bowel sounds are normal.  Lymphadenopathy:    She has no cervical adenopathy.  Neurological: She is alert and oriented to person, place, and time.  Skin: Skin is warm and dry. Capillary refill takes less than 2 seconds.  Psychiatric: She has a normal mood and affect. Her behavior is normal.  Nursing note and vitals reviewed.    Musculoskeletal Exam: Generalized hyperalgesia on exam.  C-spine limited ROM with discomfort.  Thoracic and lumbar spine good range of motion.  Bilateral SI joint tenderness.  Shoulder joints limited ROM due to discomfort.  Elbow joints, wrist joints, MCPs, PIPs, and DIPs good ROM with no synovitis.  Hip joints, knee joints,  ankle joints, MTPs, PIPs, and DIPs good ROM with no synovitis.  No warmth or effusion of knee joints.  18/18 tender points.    CDAI Exam: CDAI Score: 1.4  Patient Global Assessment: 7 (mm); Provider Global Assessment: 7 (mm) Swollen: 0 ; Tender: 0  Joint Exam   Not documented   There is currently no information documented on the homunculus. Go to the Rheumatology activity and complete the homunculus joint exam.  Investigation: No additional findings.  Imaging: No results found.  Recent Labs: Lab Results  Component Value Date   WBC 10.3 12/21/2017   HGB 12.9 12/21/2017   PLT 267 12/21/2017   NA 141 12/21/2017   K 3.3 (L) 12/21/2017   CL 106 12/21/2017   CO2 30 12/21/2017   GLUCOSE 103 (H) 12/21/2017   BUN 9 12/21/2017   CREATININE 0.99 12/21/2017   BILITOT 0.4 12/21/2017   ALKPHOS 63 12/21/2017   AST 17 12/21/2017   ALT 14 12/21/2017   PROT 6.4 (L) 12/21/2017   ALBUMIN 3.1 (L) 12/21/2017   CALCIUM 8.7 (L) 12/21/2017   GFRAA >60 12/21/2017   QFTBGOLD Negative 09/01/2016   QFTBGOLDPLUS Negative 08/31/2017    Speciality Comments: ORENCIA 1000 mg x 4 weeks TB GOLD negative 09/01/16  Procedures:  No procedures performed Allergies: Influenza vaccines and Isosorbide   Assessment / Plan:     Visit Diagnoses: Rheumatoid arthritis involving multiple sites with positive rheumatoid factor (HCC): She has no synovitis on exam.  She has not had any recent rheumatoid arthritis flares.  She has not been on prednisone recently.  She is currently having a fibromyalgia flare.  Her last Orencia infusion was 2 weeks ago.  She will continue on the current treatment regimen of Orencia IV infusions, methotrexate 1 mL subcutaneous a once weekly and folic acid 2 mg daily.  Refills of Rasuvo and folic acid were sent to the pharmacy today.  She is advised to notify us if she develops increased joint pain or joint swelling.  She will follow-up in 5 months.  High risk medication use - Orencia IV,  MTX 1 mL subcu weekly, folic acid 2mg  po qd. CBC and CMP were drawn on 12/11/2017.  Primary osteoarthritis of both knees: No warmth or effusion.  She is good range of motion.  She is having increased pain in bilateral knee joints.  Fibromyalgia: She is currently having a fibromyalgia flare.  She has generalized hyperalgesia on exam.  She has not been sleeping well for the past 3 nights.  She is been having increased generalized muscle aches and muscle tenderness.  Her fatigue is also  been worsening.  She is having trapezius muscle spasms and muscle tenderness.  She has bilateral trochanteric bursitis as well.  She will continue taking gabapentin 100 mg 1 capsule by mouth 3 times daily.  She also uses Lidoderm patches as well as Voltaren gel.  A refill of Voltaren gel was provided to the patient today.  She was encouraged to try to exercise on a regular basis.  Good sleep hygiene was discussed.  Trochanteric bursitis of both hips: She has tenderness of bilateral trochanteric bursa on exam.  She was encouraged to perform exercises on a regular basis.  Other fatigue: Chronic and related to insomnia.   Other insomnia: Chronic. Good sleep hygiene was discussed.   Other medical conditions are listed as follows:   History of coronary artery disease  History of hypertension  Solitary pulmonary nodule  Dyslipidemia  History of diastolic dysfunction  History of depression  History of vitamin D deficiency  S/P laparoscopic sleeve gastrectomy   Orders: No orders of the defined types were placed in this encounter.  Meds ordered this encounter  Medications  . diclofenac sodium (VOLTAREN) 1 % GEL    Sig: Apply 3 grams to 3 large joints up to 3 times daily as needed.    Dispense:  3 Tube    Refill:  3  . Methotrexate, PF, (RASUVO) 25 MG/0.5ML SOAJ    Sig: INJECT ONE PEN SUBCUTANEOUSLY ONCE EVERY WEEK. STORE AT ROOM TEMPERATURE BETWEEN 68 - 77 DEGREES F.    Dispense:  12 pen    Refill:  0  .  Folic Acid-Vit B6-Vit B12 (FABB) 2.2-25-1 MG TABS    Sig: Take 2 tablets by mouth daily.    Dispense:  180 each    Refill:  1    Face-to-face time spent with patient was 30 minutes. Greater than 50% of time was spent in counseling and coordination of care.  Follow-Up Instructions: Return in about 5 months (around 07/05/2018) for Rheumatoid arthritis, Osteoarthritis, Fibromyalgia.   Gearldine Bienenstock, PA-C  Note - This record has been created using Dragon software.  Chart creation errors have been sought, but may not always  have been located. Such creation errors do not reflect on  the standard of medical care.

## 2018-02-01 NOTE — Telephone Encounter (Signed)
Patient has been scheduled for 02/02/18 at 2:00 pm.

## 2018-02-02 ENCOUNTER — Encounter: Payer: Self-pay | Admitting: Physician Assistant

## 2018-02-02 ENCOUNTER — Ambulatory Visit (INDEPENDENT_AMBULATORY_CARE_PROVIDER_SITE_OTHER): Payer: BLUE CROSS/BLUE SHIELD | Admitting: Physician Assistant

## 2018-02-02 VITALS — BP 126/86 | HR 93 | Resp 16 | Ht 62.0 in | Wt 269.8 lb

## 2018-02-02 DIAGNOSIS — Z8639 Personal history of other endocrine, nutritional and metabolic disease: Secondary | ICD-10-CM

## 2018-02-02 DIAGNOSIS — M170 Bilateral primary osteoarthritis of knee: Secondary | ICD-10-CM

## 2018-02-02 DIAGNOSIS — M7061 Trochanteric bursitis, right hip: Secondary | ICD-10-CM

## 2018-02-02 DIAGNOSIS — R5383 Other fatigue: Secondary | ICD-10-CM

## 2018-02-02 DIAGNOSIS — G4709 Other insomnia: Secondary | ICD-10-CM

## 2018-02-02 DIAGNOSIS — Z79899 Other long term (current) drug therapy: Secondary | ICD-10-CM

## 2018-02-02 DIAGNOSIS — M0579 Rheumatoid arthritis with rheumatoid factor of multiple sites without organ or systems involvement: Secondary | ICD-10-CM

## 2018-02-02 DIAGNOSIS — Z8679 Personal history of other diseases of the circulatory system: Secondary | ICD-10-CM

## 2018-02-02 DIAGNOSIS — Z8659 Personal history of other mental and behavioral disorders: Secondary | ICD-10-CM

## 2018-02-02 DIAGNOSIS — M7062 Trochanteric bursitis, left hip: Secondary | ICD-10-CM

## 2018-02-02 DIAGNOSIS — R911 Solitary pulmonary nodule: Secondary | ICD-10-CM

## 2018-02-02 DIAGNOSIS — Z9884 Bariatric surgery status: Secondary | ICD-10-CM

## 2018-02-02 DIAGNOSIS — M797 Fibromyalgia: Secondary | ICD-10-CM

## 2018-02-02 DIAGNOSIS — E785 Hyperlipidemia, unspecified: Secondary | ICD-10-CM

## 2018-02-02 MED ORDER — DICLOFENAC SODIUM 1 % TD GEL: TRANSDERMAL | 3 refills | Status: DC

## 2018-02-02 MED ORDER — FOLIC ACID-VIT B6-VIT B12 2.2-25-1 MG PO TABS: 2.0000 | ORAL_TABLET | Freq: Every day | ORAL | 1 refills | Status: DC

## 2018-02-02 MED ORDER — METHOTREXATE (PF) 25 MG/0.5ML ~~LOC~~ SOAJ: SUBCUTANEOUS | 0 refills | Status: DC

## 2018-02-02 NOTE — Patient Instructions (Signed)
Standing Labs We placed an order today for your standing lab work.   Please come back and get your standing labs in October and every 3 months   We have open lab Monday through Friday from 8:30-11:30 AM and 1:30-4:00 PM  at the office of Dr. Shaili Deveshwar.   You may experience shorter wait times on Monday and Friday afternoons. The office is located at [address], [address], [place], [place] [address] No appointment is necessary.   Labs are drawn by [facility].  You may receive a bill from [facility] for your lab work. If you have any questions regarding directions or hours of operation,  please call [phone].    

## 2018-02-07 ENCOUNTER — Other Ambulatory Visit: Payer: Self-pay | Admitting: Physician Assistant

## 2018-02-07 NOTE — Telephone Encounter (Signed)
Last Visit: 02/02/18 Next Visit due January 2020. Message sent to the front to schedule patient.   Okay to refill per Dr. Corliss Skains

## 2018-02-13 ENCOUNTER — Telehealth: Payer: Self-pay | Admitting: Rheumatology

## 2018-02-13 NOTE — Telephone Encounter (Signed)
Patient called requesting prescription refill of Prednisone to be sent to [facility] on [address] in [place].  Patient states she is having pain and swelling in her ankles, wrists, and legs.

## 2018-02-13 NOTE — Telephone Encounter (Signed)
Contacted patient and offered multiple appointments. Patient declined appointments. Patient states she will call after lunch when she has been able to talk with her neighbor to see if she may be able to bring her to her appointment.

## 2018-02-15 ENCOUNTER — Ambulatory Visit (HOSPITAL_COMMUNITY)
Admission: RE | Admit: 2018-02-15 | Discharge: 2018-02-15 | Disposition: A | Discharge status: Home / Self Care | Payer: BLUE CROSS/BLUE SHIELD | Source: Ambulatory Visit | Attending: Rheumatology | Admitting: Rheumatology

## 2018-02-15 DIAGNOSIS — Z79899 Other long term (current) drug therapy: Secondary | ICD-10-CM

## 2018-02-15 DIAGNOSIS — M0579 Rheumatoid arthritis with rheumatoid factor of multiple sites without organ or systems involvement: Secondary | ICD-10-CM | POA: Diagnosis not present

## 2018-02-15 LAB — COMPREHENSIVE METABOLIC PANEL
ALBUMIN: 3.1 g/dL — AB (ref 3.5–5.0)
ALK PHOS: 66 U/L (ref 38–126)
ALT: 16 U/L (ref 0–44)
ANION GAP: 9 (ref 5–15)
AST: 24 U/L (ref 15–41)
BUN: 8 mg/dL (ref 6–20)
CALCIUM: 8.9 mg/dL (ref 8.9–10.3)
CHLORIDE: 109 mmol/L (ref 98–111)
CO2: 24 mmol/L (ref 22–32)
Creatinine, Ser: 0.93 mg/dL (ref 0.44–1.00)
GFR calc non Af Amer: >60 mL/min (ref >60)
GLUCOSE: 107 mg/dL — AB (ref 70–99)
Potassium: 3.3 mmol/L — ABNORMAL LOW (ref 3.5–5.1)
Sodium: 142 mmol/L (ref 135–145)
Total Bilirubin: 0.5 mg/dL (ref 0.3–1.2)
Total Protein: 6.6 g/dL (ref 6.5–8.1)

## 2018-02-15 LAB — CBC
HEMATOCRIT: 44.7 % (ref 36.0–46.0)
HEMOGLOBIN: 13.6 g/dL (ref 12.0–15.0)
MCH: 26.1 pg (ref 26.0–34.0)
MCHC: 30.4 g/dL (ref 30.0–36.0)
MCV: 85.6 fL (ref 78.0–100.0)
Platelets: 253 10*3/uL (ref 150–400)
RBC: 5.22 MIL/uL — ABNORMAL HIGH (ref 3.87–5.11)
RDW: 12.6 % (ref 11.5–15.5)
WBC: 7.1 10*3/uL (ref 4.0–10.5)

## 2018-02-15 MED ORDER — SODIUM CHLORIDE 0.9 % IV SOLN
1000.0000 mg | INTRAVENOUS | Status: DC
  Administered 2018-02-15: 1000 mg via INTRAVENOUS
  Filled 2018-02-15: qty 40

## 2018-02-15 MED ORDER — ACETAMINOPHEN 325 MG PO TABS
650.0000 mg | ORAL_TABLET | ORAL | Status: DC
  Administered 2018-02-15: 650 mg via ORAL

## 2018-02-15 MED ORDER — DIPHENHYDRAMINE HCL 25 MG PO CAPS
ORAL_CAPSULE | ORAL | Status: AC
  Administered 2018-02-15: 25 mg via ORAL
  Filled 2018-02-15: qty 1

## 2018-02-15 MED ORDER — ACETAMINOPHEN 325 MG PO TABS
ORAL_TABLET | ORAL | Status: AC
  Administered 2018-02-15: 650 mg via ORAL
  Filled 2018-02-15: qty 2

## 2018-02-15 MED ORDER — DIPHENHYDRAMINE HCL 25 MG PO CAPS
25.0000 mg | ORAL_CAPSULE | ORAL | Status: DC
  Administered 2018-02-15: 25 mg via ORAL

## 2018-02-15 NOTE — Progress Notes (Signed)
K is low. Please have patient contact her PCP.

## 2018-02-16 ENCOUNTER — Ambulatory Visit: Payer: Self-pay | Admitting: Physician Assistant

## 2018-02-17 ENCOUNTER — Other Ambulatory Visit: Payer: Self-pay | Admitting: *Deleted

## 2018-02-17 NOTE — Progress Notes (Signed)
Infusion orders are current for patient CBC CMP Tylenol Benadryl appointments are up to date and follow up appointment  is scheduled TB gold not due yet.  

## 2018-02-20 ENCOUNTER — Other Ambulatory Visit: Payer: Self-pay | Admitting: Rheumatology

## 2018-02-20 DIAGNOSIS — E876 Hypokalemia: Secondary | ICD-10-CM | POA: Insufficient documentation

## 2018-02-20 DIAGNOSIS — R5383 Other fatigue: Secondary | ICD-10-CM | POA: Insufficient documentation

## 2018-02-20 DIAGNOSIS — F419 Anxiety disorder, unspecified: Secondary | ICD-10-CM | POA: Insufficient documentation

## 2018-02-24 ENCOUNTER — Other Ambulatory Visit: Payer: Self-pay | Admitting: Cardiology

## 2018-03-15 ENCOUNTER — Ambulatory Visit (HOSPITAL_COMMUNITY)
Admission: RE | Admit: 2018-03-15 | Discharge: 2018-03-15 | Disposition: A | Discharge status: Home / Self Care | Payer: BLUE CROSS/BLUE SHIELD | Source: Ambulatory Visit | Attending: Rheumatology | Admitting: Rheumatology

## 2018-03-15 DIAGNOSIS — M0579 Rheumatoid arthritis with rheumatoid factor of multiple sites without organ or systems involvement: Secondary | ICD-10-CM | POA: Insufficient documentation

## 2018-03-15 DIAGNOSIS — Z79899 Other long term (current) drug therapy: Secondary | ICD-10-CM

## 2018-03-15 MED ORDER — SODIUM CHLORIDE 0.9 % IV SOLN
1000.0000 mg | INTRAVENOUS | Status: DC
  Administered 2018-03-15: 1000 mg via INTRAVENOUS
  Filled 2018-03-15: qty 40

## 2018-03-15 MED ORDER — DIPHENHYDRAMINE HCL 25 MG PO CAPS: 25.0000 mg | ORAL_CAPSULE | ORAL | Status: DC

## 2018-03-15 MED ORDER — ACETAMINOPHEN 325 MG PO TABS: 650.0000 mg | ORAL_TABLET | ORAL | Status: DC

## 2018-03-27 ENCOUNTER — Other Ambulatory Visit: Payer: Self-pay | Admitting: *Deleted

## 2018-03-27 DIAGNOSIS — M0579 Rheumatoid arthritis with rheumatoid factor of multiple sites without organ or systems involvement: Secondary | ICD-10-CM

## 2018-04-12 ENCOUNTER — Ambulatory Visit (HOSPITAL_COMMUNITY)
Admission: RE | Admit: 2018-04-12 | Discharge: 2018-04-12 | Disposition: A | Discharge status: Home / Self Care | Payer: BLUE CROSS/BLUE SHIELD | Source: Ambulatory Visit | Attending: Rheumatology | Admitting: Rheumatology

## 2018-04-12 DIAGNOSIS — M0579 Rheumatoid arthritis with rheumatoid factor of multiple sites without organ or systems involvement: Secondary | ICD-10-CM | POA: Insufficient documentation

## 2018-04-12 LAB — COMPREHENSIVE METABOLIC PANEL
ALBUMIN: 3.3 g/dL — AB (ref 3.5–5.0)
ALT: 16 U/L (ref 0–44)
AST: 21 U/L (ref 15–41)
Alkaline Phosphatase: 75 U/L (ref 38–126)
Anion gap: 8 (ref 5–15)
BILIRUBIN TOTAL: 0.5 mg/dL (ref 0.3–1.2)
BUN: 14 mg/dL (ref 6–20)
CHLORIDE: 105 mmol/L (ref 98–111)
CO2: 26 mmol/L (ref 22–32)
CREATININE: 1.12 mg/dL — AB (ref 0.44–1.00)
Calcium: 9 mg/dL (ref 8.9–10.3)
GFR calc Af Amer: >60 mL/min (ref >60)
GFR, EST NON AFRICAN AMERICAN: 56 mL/min — AB (ref >60)
GLUCOSE: 123 mg/dL — AB (ref 70–99)
POTASSIUM: 4.1 mmol/L (ref 3.5–5.1)
Sodium: 139 mmol/L (ref 135–145)
Total Protein: 6.7 g/dL (ref 6.5–8.1)

## 2018-04-12 LAB — CBC
HEMATOCRIT: 45 % (ref 36.0–46.0)
Hemoglobin: 13.4 g/dL (ref 12.0–15.0)
MCH: 25.5 pg — ABNORMAL LOW (ref 26.0–34.0)
MCHC: 29.8 g/dL — ABNORMAL LOW (ref 30.0–36.0)
MCV: 85.6 fL (ref 80.0–100.0)
NRBC: 0 % (ref 0.0–0.2)
PLATELETS: 305 10*3/uL (ref 150–400)
RBC: 5.26 MIL/uL — ABNORMAL HIGH (ref 3.87–5.11)
RDW: 12.6 % (ref 11.5–15.5)
WBC: 9.4 10*3/uL (ref 4.0–10.5)

## 2018-04-12 MED ORDER — ACETAMINOPHEN 325 MG PO TABS
650.0000 mg | ORAL_TABLET | ORAL | Status: DC
  Administered 2018-04-12: 650 mg via ORAL

## 2018-04-12 MED ORDER — SODIUM CHLORIDE 0.9 % IV SOLN
1000.0000 mg | INTRAVENOUS | Status: DC
  Administered 2018-04-12: 1000 mg via INTRAVENOUS
  Filled 2018-04-12: qty 40

## 2018-04-12 MED ORDER — DIPHENHYDRAMINE HCL 25 MG PO CAPS
ORAL_CAPSULE | ORAL | Status: AC
  Administered 2018-04-12: 25 mg via ORAL
  Filled 2018-04-12: qty 1

## 2018-04-12 MED ORDER — DIPHENHYDRAMINE HCL 25 MG PO CAPS
25.0000 mg | ORAL_CAPSULE | ORAL | Status: DC
  Administered 2018-04-12: 25 mg via ORAL

## 2018-04-12 MED ORDER — ACETAMINOPHEN 325 MG PO TABS
ORAL_TABLET | ORAL | Status: AC
  Administered 2018-04-12: 650 mg via ORAL
  Filled 2018-04-12: qty 2

## 2018-04-12 NOTE — Progress Notes (Signed)
Repeat BMP with GFR in 1 month.  Please advise patient not to take any NSAIDs.

## 2018-04-16 ENCOUNTER — Other Ambulatory Visit: Payer: Self-pay | Admitting: Cardiology

## 2018-04-26 ENCOUNTER — Other Ambulatory Visit: Payer: Self-pay | Admitting: Physician Assistant

## 2018-04-26 MED ORDER — FOLIC ACID 1 MG PO TABS: 2.0000 mg | ORAL_TABLET | Freq: Every day | ORAL | 3 refills | Status: DC

## 2018-04-26 NOTE — Telephone Encounter (Signed)
Patient advised can call and folic acid 1 mg 2 tablets p.o. daily.  She can take B complex over-the-counter since FABB tab is not available. Patient verbalize understanding.

## 2018-04-26 NOTE — Telephone Encounter (Signed)
Please notify patient that 5 tablets not available.  He can call and folic acid 1 mg 2 tablets p.o. daily.  She can take B complex over-the-counter.

## 2018-04-27 ENCOUNTER — Other Ambulatory Visit: Payer: Self-pay | Admitting: *Deleted

## 2018-04-27 NOTE — Progress Notes (Signed)
Infusion orders are current for patient CBC CMP Tylenol Benadryl appointments are up to date and follow up appointment  is scheduled TB gold not due yet.  

## 2018-05-10 ENCOUNTER — Ambulatory Visit (HOSPITAL_COMMUNITY)
Admission: RE | Admit: 2018-05-10 | Discharge: 2018-05-10 | Disposition: A | Discharge status: Home / Self Care | Payer: BLUE CROSS/BLUE SHIELD | Source: Ambulatory Visit | Attending: Rheumatology | Admitting: Rheumatology

## 2018-05-11 ENCOUNTER — Emergency Department (HOSPITAL_COMMUNITY): Payer: BLUE CROSS/BLUE SHIELD

## 2018-05-11 ENCOUNTER — Emergency Department (HOSPITAL_COMMUNITY)
Admission: EM | Admit: 2018-05-11 | Discharge: 2018-05-12 | Disposition: A | Discharge status: Home / Self Care | Payer: BLUE CROSS/BLUE SHIELD | Attending: Emergency Medicine | Admitting: Emergency Medicine

## 2018-05-11 ENCOUNTER — Other Ambulatory Visit: Payer: Self-pay | Admitting: Rheumatology

## 2018-05-11 ENCOUNTER — Other Ambulatory Visit: Payer: Self-pay

## 2018-05-11 DIAGNOSIS — R079 Chest pain, unspecified: Secondary | ICD-10-CM

## 2018-05-11 DIAGNOSIS — Z79899 Other long term (current) drug therapy: Secondary | ICD-10-CM | POA: Insufficient documentation

## 2018-05-11 DIAGNOSIS — Z7722 Contact with and (suspected) exposure to environmental tobacco smoke (acute) (chronic): Secondary | ICD-10-CM | POA: Insufficient documentation

## 2018-05-11 DIAGNOSIS — Z7982 Long term (current) use of aspirin: Secondary | ICD-10-CM | POA: Diagnosis not present

## 2018-05-11 LAB — CBC WITH DIFFERENTIAL/PLATELET
ABS IMMATURE GRANULOCYTES: 0.04 10*3/uL (ref 0.00–0.07)
BASOS ABS: 0.1 10*3/uL (ref 0.0–0.1)
Basophils Relative: 1 %
EOS PCT: 1 %
Eosinophils Absolute: 0.1 10*3/uL (ref 0.0–0.5)
HEMATOCRIT: 40.4 % (ref 36.0–46.0)
HEMOGLOBIN: 12.4 g/dL (ref 12.0–15.0)
IMMATURE GRANULOCYTES: 0 %
LYMPHS ABS: 4.3 10*3/uL — AB (ref 0.7–4.0)
LYMPHS PCT: 39 %
MCH: 26 pg (ref 26.0–34.0)
MCHC: 30.7 g/dL (ref 30.0–36.0)
MCV: 84.7 fL (ref 80.0–100.0)
Monocytes Absolute: 0.9 10*3/uL (ref 0.1–1.0)
Monocytes Relative: 8 %
NEUTROS ABS: 5.4 10*3/uL (ref 1.7–7.7)
NEUTROS PCT: 51 %
Platelets: 271 10*3/uL (ref 150–400)
RBC: 4.77 MIL/uL (ref 3.87–5.11)
RDW: 12.9 % (ref 11.5–15.5)
WBC: 10.9 10*3/uL — AB (ref 4.0–10.5)
nRBC: 0 % (ref 0.0–0.2)

## 2018-05-11 LAB — I-STAT CHEM 8, ED
BUN: 10 mg/dL (ref 6–20)
CHLORIDE: 107 mmol/L (ref 98–111)
Calcium, Ion: 1.08 mmol/L — ABNORMAL LOW (ref 1.15–1.40)
Creatinine, Ser: 0.9 mg/dL (ref 0.44–1.00)
Glucose, Bld: 94 mg/dL (ref 70–99)
HEMATOCRIT: 39 % (ref 36.0–46.0)
Hemoglobin: 13.3 g/dL (ref 12.0–15.0)
POTASSIUM: 3.3 mmol/L — AB (ref 3.5–5.1)
SODIUM: 141 mmol/L (ref 135–145)
TCO2: 27 mmol/L (ref 22–32)

## 2018-05-11 LAB — I-STAT TROPONIN, ED
Troponin i, poc: 0 ng/mL (ref 0.00–0.08)
Troponin i, poc: 0 ng/mL (ref 0.00–0.08)

## 2018-05-11 MED ORDER — ALUM & MAG HYDROXIDE-SIMETH 200-200-20 MG/5ML PO SUSP
30.0000 mL | Freq: Once | ORAL | Status: AC
  Administered 2018-05-11: 30 mL via ORAL
  Filled 2018-05-11: qty 30

## 2018-05-11 NOTE — Discharge Instructions (Signed)
Testing today is normal.  There is no sign of heart attack at this time.  You can try using Maalox for heartburn, or Tylenol if needed for pain.  Follow-up with your cardiologist for a checkup in 1 or 2 weeks.  Return here if needed for problems.

## 2018-05-11 NOTE — ED Triage Notes (Signed)
Pt presents to ED for evaluation of chest pain and shob that started about 30 mins ago. Chest pain is central and constant pressure, non radiating and began after stressful conversation with family. Hx of anxiety. Shob resolved on arrival. 3 nitro and 324 asa given PTA, with relief to 8/10 to 6/10.

## 2018-05-11 NOTE — ED Notes (Signed)
Pt states that pain had been decreasing, but now it's coming back again. "Feels like I need to burp."

## 2018-05-11 NOTE — Telephone Encounter (Signed)
Last Visit: 02/02/18 Next Visit: 06/14/18 Labs: 04/12/18 Creat 1.12 Glucose 123  Okay to refill per Dr. Corliss Skains

## 2018-05-11 NOTE — ED Provider Notes (Signed)
[facility] EMERGENCY DEPARTMENT Provider Note   CSN: [id number] Arrival date & time: 05/11/18  1932     History   Chief Complaint Chief Complaint  Patient presents with  . Chest Pain  . Shortness of Breath    HPI RAYMIE TRANI is a 52 y.o. female.  HPI   She is here for evaluation of chest pain and shortness of breath which started just prior to arrival.  He was treated by EMS, and transported here.  She has been given nitroglycerin and aspirin, with resolution of her discomfort.  She has history of coronary artery disease found at cath.  She states her chest pain started while her two teenage children were arguing.  She took a single nitroglycerin and then EMS gave her 2 more, and subsequently her pain improved.  Pain was initially 8/10.  It now is again 8/10.  She is also received aspirin.  She has not had to use nitroglycerin since her catheterization in 2016.  She states that she was short of breath earlier, but that has now abated.  She denies vomiting but did have some nausea.  She denies vertigo but she did notice some dizziness earlier.  She has been eating well.  She has not had any recent cough.  She does not feel like she has swelling in her legs.  She is not currently employed.  There are no other known modifying factors.  Past Medical History:  Diagnosis Date  . Anxiety   . Coronary artery calcification seen on CAT scan    minimal CAD with 30% prox and mild LAD  . Depression   . Diastolic dysfunction   . Dyslipidemia 01/30/2015  . Esophageal ring   . Fibromyalgia   . Hiatal hernia   . HSV-1 infection   . Morbid obesity (HCC)   . Rheumatoid arthritis(714.0)    M05.79  . Rheumatoid arthritis, seropositive, 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HCC)    Treated with Orencia, TB neg 09/12/2015    Patient Active Problem List   Diagnosis Date Noted  . High risk medication use 08/03/2016  . Fibromyalgia syndrome 08/03/2016  . Abnormal cardiac CT angiography   .  Sacrococcygeal pain 02/20/2016  . Rheumatoid arthritis involving 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HCC) 01/24/2016  . 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 Dyslipidemia 01/30/2015  . 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 Essential hypertension 08/25/2014  . Morbid obesity (HCC) 08/25/2014  . CAD (coronary artery disease), native coronary artery 08/24/2014  . Numbness and tingling in left arm   . Arm numbness left 08/22/2014  . Chest pain 08/22/2014  . Gastroesophageal reflux disease without esophagitis 07/23/2014  . Coronary artery calcification seen on CAT scan 02/19/2014  . Hip pain 10/23/2013  . Solitary pulmonary nodule 08/28/2013  . Chest pain, atypical 07/18/2013  . Heart palpitations 06/27/2013  . Cough 06/04/2013  . Diastolic dysfunction 05/18/2013  . SOB (shortness of breath) 05/09/2013  . Other malaise and fatigue 01/21/2013  . Stress and adjustment reaction 12/09/2012  . Right sided abdominal pain 11/29/2012  . 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/21/2012  . Morbid obesity with BMI of 50.0-59.9, adult (HCC) 11/21/2012  . Arthritis 07/13/2012  . Depression 07/13/2012  . Routine health maintenance 05/07/2012    Past Surgical History:  Procedure Laterality Date  . CARDIAC CATHETERIZATION N/A 06/11/2016   Procedure: Left Heart Cath and Coronary Angiography;  Surgeon: Corky Crafts, MD;  Location: [facility] INVASIVE CV LAB;  Service: Cardiovascular;  Laterality: N/A;  . CESAREAN SECTION    . COLONOSCOPY  07/27/2017  . ESOPHAGEAL MANOMETRY N/A 10/28/2014   Procedure: ESOPHAGEAL MANOMETRY (EM);  Surgeon: Hilarie Fredrickson, MD;  Location: [facility] ENDOSCOPY;  Service: Endoscopy;  Laterality: N/A;  . HERNIA REPAIR     reports surgery on 3 hernias, with 2 more present  . LAPAROSCOPIC GASTRIC SLEEVE RESECTION  11/21/12   [facility]  . LEFT HEART CATHETERIZATION WITH CORONARY ANGIOGRAM N/A 08/26/2014   Procedure: LEFT HEART CATHETERIZATION WITH  CORONARY ANGIOGRAM;  Surgeon: Runell Gess, MD;  Location: [facility] CATH LAB;  Service: Cardiovascular;  Laterality: N/A;  . TUBAL LIGATION       OB History   None      Home Medications    Prior to Admission medications   Medication Sig Start Date End Date Taking? Authorizing Provider  Abatacept (ORENCIA IV) Inject 1,000 mg into the vein every 28 (twenty-eight) days.     [provider]  Adapalene 0.3 % gel Apply 1 application topically daily as needed (acne).  12/31/14   [provider]  amitriptyline (ELAVIL) 10 MG tablet Take 10 mg by mouth at bedtime.    [provider]  ARIPiprazole (ABILIFY) 2 MG tablet Take 1 tablet (2 mg total) by mouth daily. 04/22/15   Benjaman Pott, MD  aspirin EC 81 MG tablet Take 81 mg by mouth daily.    [provider]  atorvastatin (LIPITOR) 80 MG tablet Take 1 tablet (80 mg total) by mouth daily at 6 PM. Please make overdue appt with Dr. Mayford Knife for future refills. 3rd attempt! 04/17/18   Quintella Reichert, MD  B-D TB SYRINGE 1CC/27GX1/2" 27G X 1/2" 1 ML MISC TO USE WITH INJECTABLE METHOTREXATE ONCE A WEEK 07/04/17   Pollyann Savoy, MD  Betamethasone Valerate 0.12 % foam Apply 1 application topically daily as needed. Itching 04/22/15   [provider]  buPROPion (WELLBUTRIN XL) 300 MG 24 hr tablet Take 1 tablet (300 mg total) by mouth daily. 12/28/14   Adonis Brook, NP  clindamycin-benzoyl peroxide (BENZACLIN) gel Apply 1 application topically daily as needed. Acne 02/13/15   [provider]  clobetasol cream (TEMOVATE) 0.05 % Apply 1 application topically daily as needed. Skin discoloration 04/22/15   [provider]  diazepam (VALIUM) 2 MG tablet Take 1 tablet 1 hour prior to scan, and may repeat right before scan if needed.  MUST HAVE A DRIVER. Patient not taking: Reported on 02/02/2018 08/26/17   Naida Sleight, PA-C  diclofenac sodium (VOLTAREN) 1 % GEL APPLY 3 GRAMS TO 3 LARGE JOINTS UP TO  THREE TIMES A DAY AS NEEDED 02/07/18   Pollyann Savoy, MD  diltiazem (CARDIZEM CD) 180 MG 24 hr capsule Take 1 capsule (180 mg total) by mouth daily. Please make yearly appt with Dr. Mayford Knife for January. 1st attempt 06/13/17   Quintella Reichert, MD  doxepin (SINEQUAN) 10 MG capsule Take 10 mg by mouth at bedtime as needed (sleep).    [provider]  EPIPEN 2-PAK 0.3 MG/0.3ML SOAJ injection Inject 0.3 mg into the skin as needed (allergic reaction).  12/30/14   [provider]  famciclovir (FAMVIR) 500 MG tablet Take 1500mg  on the onset of fever blister for 2 or 3 days as needed for fever blisters 01/21/15   [provider]  FLUoxetine (PROZAC) 10 MG capsule Take 10 mg by mouth daily.    [provider]  fluticasone (FLONASE) 50 MCG/ACT nasal spray Place  2 sprays into both nostrils as needed for allergies. 12/28/14 02/02/18  Adonis Brook, NP  folic acid (FOLVITE) 1 MG tablet Take 2 tablets (2 mg total) by mouth daily. 04/26/18   Pollyann Savoy, MD  Folic Acid-Vit B6-Vit B12 (FABB) 2.2-25-1 MG TABS Take 1 tablet by mouth daily. 11/11/16   Panwala, Naitik, PA-C  Folic Acid-Vit B6-Vit B12 (FABB) 2.2-25-1 MG TABS Take 2 tablets by mouth daily. 02/02/18   Gearldine Bienenstock, PA-C  gabapentin (NEURONTIN) 100 MG capsule TAKE 1 CAPSULE BY MOUTH THREE TIMES A DAY 09/20/17   Pollyann Savoy, MD  HYDROcodone-acetaminophen (NORCO/VICODIN) 5-325 MG tablet Take 1 tablet by mouth every 4 (four) hours as needed. Patient not taking: Reported on 02/02/2018 01/23/18   Tegeler, Canary Brim, MD  hydrOXYzine (VISTARIL) 25 MG capsule Take 25 mg by mouth every 8 (eight) hours as needed for anxiety.  12/30/14   [provider]  levothyroxine (SYNTHROID, LEVOTHROID) 25 MCG tablet Take 1 tablet (25 mcg total) by mouth daily before breakfast. 12/28/14   Adonis Brook, NP  lidocaine (LIDODERM) 5 % Place 1 patch onto the skin daily. Remove & Discard patch within 12 hours or as directed by MD  01/23/18   Tegeler, Canary Brim, MD  Methotrexate, Anti-Rheumatic, (OTREXUP [place]) Inject into the skin.    [provider]  Methotrexate, PF, (RASUVO) 25 MG/0.5ML SOAJ INJECT ONE PEN SUBCUTANEOUSLY ONCE EVERY WEEK. STORE AT ROOM TEMPERATURE BETWEEN 68 - 77 DEGREES F. 02/02/18   Gearldine Bienenstock, PA-C  metoprolol tartrate (LOPRESSOR) 25 MG tablet Take 1 tablet (25 mg total) by mouth 2 (two) times daily. Patient not taking: Reported on 08/05/2016 06/04/16 09/02/16  Manson Passey, PA  nitroGLYCERIN (NITROSTAT) 0.4 MG SL tablet PLACE 1 TABLET UNDER THE TONGUE EVERY 5 MINUTES AS NEEDED FOR CHEST PAIN. 09/06/16   Quintella Reichert, MD  pantoprazole (PROTONIX) 40 MG tablet TAKE 1 TABLET (40 MG TOTAL) BY MOUTH 2 (TWO) TIMES DAILY. 02/03/16   Esterwood, Amy S, PA-C  RASUVO 25 MG/0.5ML SOAJ INJECT ONE PEN SUBCUTANEOUSLY ONCE EVERY WEEK. STORE AT ROOM TEMPERATURE BETWEEN 68 - 77 DEGREES F. 05/11/18   Pollyann Savoy, MD  rosuvastatin (CRESTOR) 20 MG tablet Take 20 mg by mouth. 09/21/16 02/02/18  [provider]  tretinoin (RETIN-A) 0.025 % cream Apply 1 application topically as needed.  02/11/15   [provider]  triamcinolone cream (KENALOG) 0.1 % Apply 1 application topically daily as needed. Itching 04/13/15   [provider]  Tuberculin-Allergy Syringes 28G X 1/2" 1 ML MISC To use with injectable methotrexate once a week 04/28/16   Pollyann Savoy, MD  XERESE 5-1 % CREA Apply 1 application topically daily as needed (fever blisters).  12/30/14   [provider]    Family History Family History  Problem Relation Age of Onset  . Hyperlipidemia Mother   . Depression Mother   . Sarcoidosis Father   . Lung disease Father        Pleural Mesothelioma  . Cancer Father   . Heart attack Paternal Grandmother   . Hypertension Paternal Grandmother   . Hypertension Maternal Grandmother   . Diabetes Maternal Grandmother   . Stroke Neg Hx   . Colon cancer Neg Hx   .  Esophageal cancer Neg Hx   . Rectal cancer Neg Hx   . Stomach cancer Neg Hx     Social History Social History   Tobacco Use  . Smoking status: Never Smoker  . Smokeless tobacco: Never  Used  Substance Use Topics  . Alcohol use: No  . Drug use: No     Allergies   Influenza vaccines and Isosorbide   Review of Systems Review of Systems  All other systems reviewed and are negative.    Physical Exam Updated Vital Signs Ht 5\' 2"  (1.575 m)   Wt 119.7 kg   SpO2 99%   BMI 48.29 kg/m   Physical Exam  Constitutional: She is oriented to person, place, and time. She appears well-developed. No distress.  Morbidly obese  HENT:  Head: Normocephalic and atraumatic.  Eyes: Pupils are equal, round, and reactive to light. Conjunctivae and EOM are normal.  Neck: Normal range of motion and phonation normal. Neck supple.  Cardiovascular: Normal rate and regular rhythm.  Pulmonary/Chest: Effort normal and breath sounds normal. She exhibits no tenderness.  Abdominal: Soft. She exhibits no distension. There is no tenderness. There is no guarding.  Musculoskeletal: Normal range of motion.  Neurological: She is alert and oriented to person, place, and time. She exhibits normal muscle tone.  Skin: Skin is warm and dry.  Psychiatric: She has a normal mood and affect. Her behavior is normal. Judgment and thought content normal.  Nursing note and vitals reviewed.    ED Treatments / Results  Labs (all labs ordered are listed, but only abnormal results are displayed) Labs Reviewed - No data to display  EKG None  Radiology No results found.  Procedures Procedures (including critical care time)  Medications Ordered in ED Medications - No data to display   Initial Impression / Assessment and Plan / ED Course  I have reviewed the triage vital signs and the nursing notes.  Pertinent labs & imaging results that were available during my care of the patient were reviewed by me and  considered in my medical decision making (see chart for details).  Clinical Course as of May 12 42  Fri May 12, 2018  0042 Normal  I-stat troponin, ED [EW]  [phone] Normal except potassium low, calcium low  I-stat Chem 8, ED(!) [EW]  [phone] Normal except white count high  CBC with Differential(!) [EW]    Clinical Course User Index [EW] 1655, MD     Patient Vitals for the past 24 hrs:  BP Temp Temp src Pulse Resp SpO2 Height Weight  05/11/18 1949 (!) 144/72 97.8 F (36.6 C) Oral 88 12 99 % - -  05/11/18 1944 - - - - - - 5\' 2"  (1.575 m) 119.7 kg  05/11/18 1943 - - - - - 99 % - -    At discharge -reevaluation with update and discussion. After initial assessment and treatment, an updated evaluation reveals she is comfortable and has no further complaints.  Findings discussed and questions answered.   Medical Decision Making: Nonspecific chest pain.  ED evaluation is reassuring.  Negative delta troponin.  Patient has mild coronary disease on cath, 3 years ago.  No indication for hospitalization at this time.  She has access to follow-up care.  CRITICAL CARE-no Performed by: 14/05/19  Nursing Notes Reviewed/ Care Coordinated Applicable Imaging Reviewed Interpretation of Laboratory Data incorporated into ED treatment  The patient appears reasonably screened and/or stabilized for discharge and I doubt any other medical condition or other [facility] requiring further screening, evaluation, or treatment in the ED at this time prior to discharge.  Plan: Home Medications-continue usual medications; Home Treatments-rest, fluids; return here if the recommended treatment, does not improve the symptoms; Recommended follow  up-cardiology follow-up 1 to 2 weeks and as needed.   Final Clinical Impressions(s) / ED Diagnoses   Final diagnoses:  None    ED Discharge Orders    None       Mancel Bale, MD 05/12/18 [phone]

## 2018-05-12 ENCOUNTER — Telehealth: Payer: Self-pay | Admitting: Pharmacy Technician

## 2018-05-12 ENCOUNTER — Telehealth (INDEPENDENT_AMBULATORY_CARE_PROVIDER_SITE_OTHER): Payer: Self-pay | Admitting: Rheumatology

## 2018-05-12 ENCOUNTER — Telehealth: Payer: Self-pay | Admitting: Rheumatology

## 2018-05-12 NOTE — Telephone Encounter (Signed)
Prescription faxed to [facility]

## 2018-05-12 NOTE — Telephone Encounter (Signed)
yes

## 2018-05-12 NOTE — Telephone Encounter (Signed)
Submitted new Orencia precertification for 2020. Submitted patient h&p, now awaiting response.  Submitted for 14 units, with coverage 06/07/18 to 06/07/19  Case# [id number] Phone# [phone] Fax# [phone]  11:05 AM Dorthula Nettles, CPhT

## 2018-05-12 NOTE — Telephone Encounter (Signed)
Submitted [name] [name] was for [id number], was advised no [name] [name] is needed for [id number] (1st hour of infusion).

## 2018-05-12 NOTE — ED Notes (Signed)
Patient verbalizes understanding of discharge instructions. Opportunity for questioning and answers were provided. Armband removed by staff, pt discharged from ED in wheelchair.  

## 2018-05-12 NOTE — Telephone Encounter (Signed)
Message Sent in Error 

## 2018-05-12 NOTE — Telephone Encounter (Signed)
Marie from [facility] left a message yesterday inquiring about the RX for the patient's TENS unit.  She requested that the RX be sent to her at fax #[phone].  Her CB#[phone].  Thank you.

## 2018-05-16 ENCOUNTER — Ambulatory Visit (HOSPITAL_COMMUNITY)
Admission: RE | Admit: 2018-05-16 | Discharge: 2018-05-16 | Disposition: A | Discharge status: Home / Self Care | Payer: BLUE CROSS/BLUE SHIELD | Source: Ambulatory Visit | Attending: Rheumatology | Admitting: Rheumatology

## 2018-05-16 DIAGNOSIS — M0579 Rheumatoid arthritis with rheumatoid factor of multiple sites without organ or systems involvement: Secondary | ICD-10-CM | POA: Diagnosis not present

## 2018-05-16 MED ORDER — DIPHENHYDRAMINE HCL 25 MG PO CAPS
25.0000 mg | ORAL_CAPSULE | ORAL | Status: DC
  Administered 2018-05-16: 25 mg via ORAL

## 2018-05-16 MED ORDER — ACETAMINOPHEN 325 MG PO TABS
650.0000 mg | ORAL_TABLET | ORAL | Status: DC
  Administered 2018-05-16: 650 mg via ORAL

## 2018-05-16 MED ORDER — SODIUM CHLORIDE 0.9 % IV SOLN
1000.0000 mg | INTRAVENOUS | Status: DC
  Filled 2018-05-16: qty 40

## 2018-05-16 MED ORDER — ACETAMINOPHEN 325 MG PO TABS
ORAL_TABLET | ORAL | Status: AC
  Filled 2018-05-16: qty 2

## 2018-05-16 MED ORDER — DIPHENHYDRAMINE HCL 25 MG PO CAPS
ORAL_CAPSULE | ORAL | Status: AC
  Filled 2018-05-16: qty 1

## 2018-05-16 NOTE — Progress Notes (Signed)
Pharmacy called and asked if we could call the patient and have her come in tomorrow morning for her orencia because her medicine will expire at noon tomorrow.  I spoke with the patient, explained that her medication would expire at noon and asked her to come in at 10:30am tomorrow so we could get her IV access and infuse her medication prior to its expiration and she agreed to do so.

## 2018-05-16 NOTE — Progress Notes (Signed)
Pt was assessed by two department RN's for a PIV and stuck once without success.  Pharmacy was notified by Alferd Apa RN that we did not have access yet so not to hurry. IV team called and stuck patient twice without success.   Patient refused to be stuck anymore and stated she wanted to try another day.  Clarisse Gouge, RN notified pharmacy who was already making her medicine. I explained to the patient that the pharmacy was already making the medicine and IV team can send another nurse to try but the patient refused.

## 2018-05-17 ENCOUNTER — Ambulatory Visit (HOSPITAL_COMMUNITY)
Admission: RE | Admit: 2018-05-17 | Discharge: 2018-05-17 | Disposition: A | Discharge status: Home / Self Care | Payer: BLUE CROSS/BLUE SHIELD | Source: Ambulatory Visit | Attending: Rheumatology | Admitting: Rheumatology

## 2018-05-17 ENCOUNTER — Encounter (HOSPITAL_COMMUNITY): Payer: Self-pay

## 2018-05-17 DIAGNOSIS — M0579 Rheumatoid arthritis with rheumatoid factor of multiple sites without organ or systems involvement: Secondary | ICD-10-CM

## 2018-05-17 MED ORDER — SODIUM CHLORIDE 0.9 % IV SOLN
1000.0000 mg | INTRAVENOUS | Status: AC
  Administered 2018-05-17: 1000 mg via INTRAVENOUS
  Filled 2018-05-17: qty 40

## 2018-05-17 MED ORDER — DIPHENHYDRAMINE HCL 25 MG PO CAPS
ORAL_CAPSULE | ORAL | Status: AC
  Filled 2018-05-17: qty 1

## 2018-05-17 MED ORDER — ACETAMINOPHEN 325 MG PO TABS
ORAL_TABLET | ORAL | Status: AC
  Filled 2018-05-17: qty 2

## 2018-05-17 MED ORDER — SODIUM CHLORIDE 0.9 % IV SOLN
1000.0000 mg | INTRAVENOUS | Status: DC
  Filled 2018-05-17: qty 40

## 2018-05-17 MED ORDER — DIPHENHYDRAMINE HCL 25 MG PO CAPS
25.0000 mg | ORAL_CAPSULE | ORAL | Status: DC
  Administered 2018-05-17: 25 mg via ORAL

## 2018-05-17 MED ORDER — ACETAMINOPHEN 325 MG PO TABS
650.0000 mg | ORAL_TABLET | ORAL | Status: DC
  Administered 2018-05-17: 650 mg via ORAL

## 2018-05-18 ENCOUNTER — Encounter (INDEPENDENT_AMBULATORY_CARE_PROVIDER_SITE_OTHER): Payer: Self-pay

## 2018-05-18 ENCOUNTER — Other Ambulatory Visit: Payer: Self-pay | Admitting: Rheumatology

## 2018-05-18 NOTE — Telephone Encounter (Signed)
Spoke to patient about renewing for [employer] copay assistance for Orencia IV. She states she usually renews over the phone.Advised her that she can continue to do that, but If she has any trouble I will print out renewal application for her to come sign. Patient said ok.

## 2018-05-19 NOTE — Progress Notes (Signed)
Office Visit Note  Patient: [name] [name]             Date of Birth: September 25, 1965           MRN: [id number]             PCP: [name], MD Referring: [name], MD Visit Date: 06/01/2018 Occupation: @GUAROCC @  Subjective:  Pain in multiple joints   History of Present Illness: [name] [name] is a 52 y.o. female  with history of seropositive rheumatoid arthritis, osteoarthritis, fibromyalgia, and trochanteric bursitis. She is on Orencia IV infusions, MTX 1 ml sq once weekly, and FABB po daily. Her last infusion was about 2 weeks ago. She is having in pain in both hands, both ankle joints, and both feet.  She states the pain is severe in both hands.  She reports she does not feel like Orenica and MTX are working.  She states she was previously taking Cymbalta, which she felt helped with her pain and she would like to restart.  She is taking Gabapentin 100 mg po daily and flexeril 10 mg 1 tablet po PRN for muscle spasms. She has been going to integrative therapies twice a week.   Activities of Daily Living:  Patient reports morning stiffness for 2-3 hours.   Patient Reports nocturnal pain.  Difficulty dressing/grooming: Denies Difficulty climbing stairs: Reports Difficulty getting out of chair: Reports Difficulty using hands for taps, buttons, cutlery, and/or writing: Reports  Review of Systems  Constitutional: Positive for fatigue.  HENT: Positive for mouth sores and mouth dryness. Negative for nose dryness.   Eyes: Negative for pain, visual disturbance and dryness.  Respiratory: Negative for cough, hemoptysis, shortness of breath and difficulty breathing.   Cardiovascular: Negative for chest pain, palpitations, hypertension and swelling in legs/feet.  Gastrointestinal: Positive for diarrhea. Negative for blood in stool and constipation.  Endocrine: Negative for increased urination.  Genitourinary: Negative for painful urination.  Musculoskeletal: Positive for  arthralgias, joint pain, joint swelling, myalgias, morning stiffness, muscle tenderness and myalgias. Negative for muscle weakness.  Skin: Negative for color change, pallor, rash, hair loss, nodules/bumps, skin tightness, ulcers and sensitivity to sunlight.  Allergic/Immunologic: Negative for susceptible to infections.  Neurological: Negative for dizziness, numbness, headaches and weakness.  Hematological: Negative for swollen glands.  Psychiatric/Behavioral: Positive for depressed mood and sleep disturbance. The patient is nervous/anxious.     PMFS History:  Patient Active Problem List   Diagnosis Date Noted  . High risk medication use 08/03/2016  . Fibromyalgia syndrome 08/03/2016  . Abnormal cardiac CT angiography   . Sacrococcygeal pain 02/20/2016  . Rheumatoid arthritis involving 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HCC) 01/24/2016  . 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 Dyslipidemia 01/30/2015  . 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 Essential hypertension 08/25/2014  . Morbid obesity (HCC) 08/25/2014  . CAD (coronary artery disease), native coronary artery 08/24/2014  . Numbness and tingling in left arm   . Arm numbness left 08/22/2014  . Chest pain 08/22/2014  . Gastroesophageal reflux disease without esophagitis 07/23/2014  . Coronary artery calcification seen on CAT scan 02/19/2014  . Hip pain 10/23/2013  . Solitary pulmonary nodule 08/28/2013  . Chest pain, atypical 07/18/2013  . Heart palpitations 06/27/2013  . Cough 06/04/2013  . Diastolic dysfunction 05/18/2013  . SOB (shortness of breath) 05/09/2013  . Other malaise and fatigue 01/21/2013  . Stress and adjustment reaction 12/09/2012  . Right sided abdominal pain 11/29/2012  . History  of laparoscopic partial gastrectomy 11/21/2012  . Morbid obesity with BMI of 50.0-59.9, adult (HCC) 11/21/2012  . Arthritis 07/13/2012  . Depression 07/13/2012  . Routine health maintenance  05/07/2012    Past Medical History:  Diagnosis Date  . Anxiety   . Coronary artery calcification seen on CAT scan    minimal CAD with 30% prox and mild LAD  . Depression   . Diastolic dysfunction   . Dyslipidemia 01/30/2015  . Esophageal ring   . Fibromyalgia   . Hiatal hernia   . HSV-1 infection   . Morbid obesity (HCC)   . Rheumatoid arthritis(714.0)    M05.79  . Rheumatoid arthritis, seropositive, 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HCC)    Treated with Orencia, TB neg 09/12/2015    Family History  Problem Relation Age of Onset  . Hyperlipidemia Mother   . Depression Mother   . Sarcoidosis Father   . Lung disease Father        Pleural Mesothelioma  . Cancer Father   . Heart attack Paternal Grandmother   . Hypertension Paternal Grandmother   . Hypertension Maternal Grandmother   . Diabetes Maternal Grandmother   . Stroke Neg Hx   . Colon cancer Neg Hx   . Esophageal cancer Neg Hx   . Rectal cancer Neg Hx   . Stomach cancer Neg Hx    Past Surgical History:  Procedure Laterality Date  . CARDIAC CATHETERIZATION N/A 06/11/2016   Procedure: Left Heart Cath and Coronary Angiography;  Surgeon: Corky Crafts, MD;  Location: [facility] INVASIVE CV LAB;  Service: Cardiovascular;  Laterality: N/A;  . CESAREAN SECTION    . COLONOSCOPY  07/27/2017  . ESOPHAGEAL MANOMETRY N/A 10/28/2014   Procedure: ESOPHAGEAL MANOMETRY (EM);  Surgeon: Hilarie Fredrickson, MD;  Location: [facility] ENDOSCOPY;  Service: Endoscopy;  Laterality: N/A;  . HERNIA REPAIR     reports surgery on 3 hernias, with 2 more present  . LAPAROSCOPIC GASTRIC SLEEVE RESECTION  11/21/12   [facility]  . LEFT HEART CATHETERIZATION WITH CORONARY ANGIOGRAM N/A 08/26/2014   Procedure: LEFT HEART CATHETERIZATION WITH CORONARY ANGIOGRAM;  Surgeon: Runell Gess, MD;  Location: [facility] CATH LAB;  Service: Cardiovascular;  Laterality: N/A;  . TUBAL LIGATION     Social History   Social History Narrative   Marital Status: Married Probation officer)    Children:  Son  [name]) Daughter [name])    Pets: None    Living Situation: Lives with husband and children    Occupation: Occupational psychologist Administrator)     Education: Oncologist (Psychology)     Tobacco Use/Exposure:  None    Alcohol Use:  Occasional   Drug Use:  None   Diet:  Regular   Exercise: Walking or Treadmill (2 x week)   Hobbies: Clinical cytogeneticist, [employer].                Objective: Vital Signs: BP (!) 143/95 (BP Location: Right Arm, Patient Position: Sitting, Cuff Size: Small)   Pulse 67   Resp 12   Ht 5\' 2"  (1.575 m)   Wt 265 lb 12.8 oz (120.6 kg)   BMI 48.62 kg/m    Physical Exam Vitals signs and nursing note reviewed.  Constitutional:      Appearance: She is well-developed.  HENT:     Head: Normocephalic and atraumatic.  Eyes:     Conjunctiva/sclera: Conjunctivae normal.  Neck:     Musculoskeletal: Normal range of motion.  Cardiovascular:     Rate and  Rhythm: Normal rate and regular rhythm.     Heart sounds: Normal heart sounds.  Pulmonary:     Effort: Pulmonary effort is normal.     Breath sounds: Normal breath sounds.  Abdominal:     General: Bowel sounds are normal.     Palpations: Abdomen is soft.  Lymphadenopathy:     Cervical: No cervical adenopathy.  Skin:    General: Skin is warm and dry.     Capillary Refill: Capillary refill takes less than 2 seconds.  Neurological:     Mental Status: She is alert and oriented to person, place, and time.  Psychiatric:        Behavior: Behavior normal.      Musculoskeletal Exam: She has generalized hyperalgesia and positive tender points on exam.  C-spine limited range of motion with discomfort.  She is trapezius muscle spasms and muscle tension bilaterally.  Thoracic and lumbar spine good range of motion with discomfort.  Shoulder joints, elbow joints, wrist joints, MCPs, PIPs, DIPs good range of motion with no synovitis.  She has complete fist formation bilaterally.  Hip joints, knee joints,  ankle joints, MTPs and PIPs and DIPs good range of motion with no synovitis.  No warmth or effusion bilateral knee joints.  She has pedal edema bilaterally.  She has tenderness of all MTP joints.  She has tenderness of bilateral SI joints.  She has tenderness of bilateral trochanter bursa.  CDAI Exam: CDAI Score: 3.4  Patient Global Assessment: 7 (mm); Provider Global Assessment: 7 (mm) Swollen: 0 ; Tender: 11  Joint Exam      Right  Left  MCP 1   Tender   Tender  MTP 1   Tender   Tender  MTP 2   Tender   Tender  MTP 3   Tender   Tender  MTP 4   Tender   Tender  MTP 5      Tender     Investigation: No additional findings.  Imaging: Dg Chest Port 1 View  Result Date: 05/11/2018 CLINICAL DATA:  Chest pain. EXAM: PORTABLE CHEST 1 VIEW COMPARISON:  01/01/2017 FINDINGS: Lungs are clear. Mild vascular crowding in the lower lungs. Heart size is upper limits of normal. The trachea is midline. Negative for a pneumothorax. Bone structures are intact. IMPRESSION: No active disease. Electronically Signed   By: Richarda Overlie M.D.   On: 05/11/2018 21:05    Recent Labs: Lab Results  Component Value Date   WBC 10.9 (H) 05/11/2018   HGB 13.3 05/11/2018   PLT 271 05/11/2018   NA 141 05/11/2018   K 3.3 (L) 05/11/2018   CL 107 05/11/2018   CO2 26 04/12/2018   GLUCOSE 94 05/11/2018   BUN 10 05/11/2018   CREATININE 0.90 05/11/2018   BILITOT 0.5 04/12/2018   ALKPHOS 75 04/12/2018   AST 21 04/12/2018   ALT 16 04/12/2018   PROT 6.7 04/12/2018   ALBUMIN 3.3 (L) 04/12/2018   CALCIUM 9.0 04/12/2018   GFRAA >60 04/12/2018   QFTBGOLD Negative 09/01/2016   QFTBGOLDPLUS Negative 08/31/2017    Speciality Comments: ORENCIA 1000 mg x 4 weeks TB GOLD negative 09/01/16  Procedures:  No procedures performed Allergies: Influenza vaccines and Isosorbide   Assessment / Plan:     Visit Diagnoses: Rheumatoid arthritis involving multiple sites with positive rheumatoid factor (HCC): She has no active  synovitis on exam.  She is clinically well controlled on Orencia IV infusions and Rasuvo subcu injections once weekly.  She  is having severe pain in bilateral hands, bilateral ankles, bilateral feet.  She has tenderness of the first MCP joints bilaterally in all MTP joints but no synovitis was noted.   We will schedule an ultrasound of bilateral hands to assess for synovitis.  She will continue his current treatment regimen at this time.  She does not need prednisone at this time.  She will follow-up in the office in 5 months.  High risk medication use - Orencia IV, MTX 1 mL subcu weekly, folic acid 2mg  po qd. CBC and CMP were drawn on 05/11/2018.  A future order for TB gold was placed today.- Plan: QuantiFERON-TB Gold Plus  Primary osteoarthritis of both knees: No warmth or effusion.  Good range of motion.  Fibromyalgia: She has generalized hyperalgesia and positive tender points on exam.  She continues have generalized muscle aches and muscle tenderness.  She has been having a fibromyalgia flare for the past 2 weeks.  She takes gabapentin 100 mg by mouth daily and Flexeril 10 mg by mouth at bedtime to help with muscle spasms.  Refills of both medications were sent to the pharmacy today.  She reports that she was previously taking Cymbalta which she felt was effective for pain management and would like to restart.  We discussed the risks of taking Flexeril and Cymbalta and she would like to continue on only Flexeril at this time.  She plans on continuing to go to integrative therapies twice a week.  Trochanteric bursitis of both hips: She has tenderness of bilateral trochanter bursa on exam.  Other insomnia: She continues to take Flexeril 10 mg by mouth at bedtime to help with muscle spasms and insomnia.  She is interrupted sleep at night due to the level of pain that she experiences.  Other fatigue: She has chronic fatigue related to insomnia  Other medical conditions are listed as follows:  History  of coronary artery disease  History of hypertension  Solitary pulmonary nodule  Dyslipidemia  History of diastolic dysfunction  History of depression  History of vitamin D deficiency  S/P laparoscopic sleeve gastrectomy   Orders: Orders Placed This Encounter  Procedures  . QuantiFERON-TB Gold Plus   Meds ordered this encounter  Medications  . cyclobenzaprine (FLEXERIL) 10 MG tablet    Sig: Take 1 tablet by mouth daily at bedtime as needed for muscle spasms.    Dispense:  30 tablet    Refill:  0  . gabapentin (NEURONTIN) 100 MG capsule    Sig: TAKE 1 CAPSULE BY MOUTH DAILY    Dispense:  90 capsule    Refill:  0    Face-to-face time spent with patient was 30 minutes. Greater than 50% of time was spent in counseling and coordination of care.  Follow-Up Instructions: Return in about 5 months (around 10/31/2018) for Rheumatoid arthritis, Fibromyalgia.   11/02/2018, PA-C  Note - This record has been created using Dragon software.  Chart creation errors have been sought, but may not always  have been located. Such creation errors do not reflect on  the standard of medical care.

## 2018-05-22 NOTE — Telephone Encounter (Signed)
Called [employer] to check status of Pre-cert renewal for Orencia. Case is still pending, with determination should be made no later than 05/27/18. Clinicals were received.  Ref# [id number] Phone# [phone]

## 2018-05-26 NOTE — Telephone Encounter (Signed)
Received call form LPN Boyd Kerbs from [facility], who stated patient's authorization will be approved for the 2020 plan year. She will send a fax over with confirmation. She advised to call plan to confirm eligibility after January 1.  PA# [id number] RepBoyd Kerbs LPN Phone# [phone], ext [phone]  Will send document to scan enter once received.  4:22 PM Dorthula Nettles, CPhT

## 2018-06-01 ENCOUNTER — Ambulatory Visit (INDEPENDENT_AMBULATORY_CARE_PROVIDER_SITE_OTHER): Payer: BLUE CROSS/BLUE SHIELD | Admitting: Physician Assistant

## 2018-06-01 ENCOUNTER — Encounter: Payer: Self-pay | Admitting: Physician Assistant

## 2018-06-01 ENCOUNTER — Telehealth: Payer: Self-pay | Admitting: Rheumatology

## 2018-06-01 VITALS — BP 143/95 | HR 67 | Resp 12 | Ht 62.0 in | Wt 265.8 lb

## 2018-06-01 DIAGNOSIS — Z8639 Personal history of other endocrine, nutritional and metabolic disease: Secondary | ICD-10-CM

## 2018-06-01 DIAGNOSIS — M797 Fibromyalgia: Secondary | ICD-10-CM

## 2018-06-01 DIAGNOSIS — M170 Bilateral primary osteoarthritis of knee: Secondary | ICD-10-CM

## 2018-06-01 DIAGNOSIS — G4709 Other insomnia: Secondary | ICD-10-CM

## 2018-06-01 DIAGNOSIS — M0579 Rheumatoid arthritis with rheumatoid factor of multiple sites without organ or systems involvement: Secondary | ICD-10-CM | POA: Diagnosis not present

## 2018-06-01 DIAGNOSIS — M7061 Trochanteric bursitis, right hip: Secondary | ICD-10-CM

## 2018-06-01 DIAGNOSIS — R5383 Other fatigue: Secondary | ICD-10-CM

## 2018-06-01 DIAGNOSIS — Z8659 Personal history of other mental and behavioral disorders: Secondary | ICD-10-CM

## 2018-06-01 DIAGNOSIS — Z79899 Other long term (current) drug therapy: Secondary | ICD-10-CM | POA: Diagnosis not present

## 2018-06-01 DIAGNOSIS — Z9884 Bariatric surgery status: Secondary | ICD-10-CM

## 2018-06-01 DIAGNOSIS — M7062 Trochanteric bursitis, left hip: Secondary | ICD-10-CM

## 2018-06-01 DIAGNOSIS — E785 Hyperlipidemia, unspecified: Secondary | ICD-10-CM

## 2018-06-01 DIAGNOSIS — Z8679 Personal history of other diseases of the circulatory system: Secondary | ICD-10-CM

## 2018-06-01 DIAGNOSIS — R911 Solitary pulmonary nodule: Secondary | ICD-10-CM

## 2018-06-01 MED ORDER — GABAPENTIN 100 MG PO CAPS: ORAL_CAPSULE | ORAL | 0 refills | Status: DC

## 2018-06-01 MED ORDER — CYCLOBENZAPRINE HCL 10 MG PO TABS: ORAL_TABLET | ORAL | 0 refills | Status: DC

## 2018-06-01 NOTE — Telephone Encounter (Signed)
Patient is requesting a 3 month supply of her prescription of Flexeril due to "her insurance company guidelines."

## 2018-06-02 MED ORDER — CYCLOBENZAPRINE HCL 10 MG PO TABS: ORAL_TABLET | ORAL | 0 refills | Status: DC

## 2018-06-02 NOTE — Telephone Encounter (Signed)
Ok to provide 90 day supply

## 2018-06-02 NOTE — Telephone Encounter (Signed)
Patient is requesting a 3 month supply of her prescription of Flexeril due to "her insurance company guidelines."  A 30 day supply was sent in at her appointment 06/01/18. CAn we send in 90 day supply of Flexeril?

## 2018-06-02 NOTE — Addendum Note (Signed)
Addended by: Henriette Combs on: 06/02/2018 03:56 PM   Modules accepted: Orders

## 2018-06-08 ENCOUNTER — Encounter (HOSPITAL_COMMUNITY): Payer: Self-pay

## 2018-06-09 NOTE — Telephone Encounter (Signed)
Called [employer], patient's plan  is active

## 2018-06-12 ENCOUNTER — Other Ambulatory Visit: Payer: Self-pay | Admitting: *Deleted

## 2018-06-12 ENCOUNTER — Telehealth: Payer: Self-pay | Admitting: Pharmacy Technician

## 2018-06-12 ENCOUNTER — Other Ambulatory Visit: Payer: Self-pay | Admitting: Physician Assistant

## 2018-06-12 DIAGNOSIS — M79671 Pain in right foot: Secondary | ICD-10-CM

## 2018-06-12 DIAGNOSIS — M79672 Pain in left foot: Principal | ICD-10-CM

## 2018-06-12 DIAGNOSIS — M0579 Rheumatoid arthritis with rheumatoid factor of multiple sites without organ or systems involvement: Secondary | ICD-10-CM

## 2018-06-12 NOTE — Telephone Encounter (Signed)
Patient called in to say at her last office visit, it was discussed that her Uric Acid level would be checked. She wanted to know if that order was placed, because she has her next Orencia infusion tomorrow and would like for them to drawn the needed labs.

## 2018-06-12 NOTE — Addendum Note (Signed)
Addended by: Henriette CombsHATTON, ANDREA L on: 06/12/2018 03:16 PM   Modules accepted: Orders

## 2018-06-12 NOTE — Telephone Encounter (Signed)
Will put order in to be drawn with next infusion.

## 2018-06-12 NOTE — Telephone Encounter (Signed)
I placed a future order for uric acid to be drawn.

## 2018-06-13 ENCOUNTER — Encounter: Payer: Self-pay | Admitting: Rheumatology

## 2018-06-13 ENCOUNTER — Ambulatory Visit (HOSPITAL_COMMUNITY)
Admission: RE | Admit: 2018-06-13 | Discharge: 2018-06-13 | Disposition: A | Discharge status: Home / Self Care | Payer: BLUE CROSS/BLUE SHIELD | Source: Ambulatory Visit | Attending: Rheumatology | Admitting: Rheumatology

## 2018-06-13 DIAGNOSIS — M0579 Rheumatoid arthritis with rheumatoid factor of multiple sites without organ or systems involvement: Secondary | ICD-10-CM | POA: Insufficient documentation

## 2018-06-13 LAB — CBC
HCT: 41.9 % (ref 36.0–46.0)
Hemoglobin: 12.9 g/dL (ref 12.0–15.0)
MCH: 26.6 pg (ref 26.0–34.0)
MCHC: 30.8 g/dL (ref 30.0–36.0)
MCV: 86.4 fL (ref 80.0–100.0)
Platelets: 304 10*3/uL (ref 150–400)
RBC: 4.85 MIL/uL (ref 3.87–5.11)
RDW: 13.4 % (ref 11.5–15.5)
WBC: 8.5 10*3/uL (ref 4.0–10.5)
nRBC: 0 % (ref 0.0–0.2)

## 2018-06-13 LAB — COMPREHENSIVE METABOLIC PANEL
ALK PHOS: 65 U/L (ref 38–126)
ALT: 29 U/L (ref 0–44)
AST: 27 U/L (ref 15–41)
Albumin: 3.4 g/dL — ABNORMAL LOW (ref 3.5–5.0)
Anion gap: 6 (ref 5–15)
BUN: 10 mg/dL (ref 6–20)
CALCIUM: 9 mg/dL (ref 8.9–10.3)
CO2: 27 mmol/L (ref 22–32)
Chloride: 109 mmol/L (ref 98–111)
Creatinine, Ser: 0.83 mg/dL (ref 0.44–1.00)
GFR calc Af Amer: >60 mL/min (ref >60)
GFR calc non Af Amer: >60 mL/min (ref >60)
Glucose, Bld: 96 mg/dL (ref 70–99)
Potassium: 4.5 mmol/L (ref 3.5–5.1)
Sodium: 142 mmol/L (ref 135–145)
Total Bilirubin: 0.8 mg/dL (ref 0.3–1.2)
Total Protein: 6.7 g/dL (ref 6.5–8.1)

## 2018-06-13 LAB — URIC ACID: Uric Acid, Serum: 4.9 mg/dL (ref 2.5–7.1)

## 2018-06-13 MED ORDER — ACETAMINOPHEN 325 MG PO TABS
ORAL_TABLET | ORAL | Status: AC
  Administered 2018-06-13: 650 mg via ORAL
  Filled 2018-06-13: qty 2

## 2018-06-13 MED ORDER — ACETAMINOPHEN 325 MG PO TABS
650.0000 mg | ORAL_TABLET | ORAL | Status: DC
  Administered 2018-06-13: 650 mg via ORAL

## 2018-06-13 MED ORDER — SODIUM CHLORIDE 0.9 % IV SOLN
1000.0000 mg | INTRAVENOUS | Status: DC
  Administered 2018-06-13: 1000 mg via INTRAVENOUS
  Filled 2018-06-13: qty 40

## 2018-06-13 MED ORDER — DIPHENHYDRAMINE HCL 25 MG PO CAPS
25.0000 mg | ORAL_CAPSULE | ORAL | Status: DC
  Administered 2018-06-13: 25 mg via ORAL

## 2018-06-13 MED ORDER — DIPHENHYDRAMINE HCL 25 MG PO CAPS
ORAL_CAPSULE | ORAL | Status: AC
  Administered 2018-06-13: 25 mg via ORAL
  Filled 2018-06-13: qty 1

## 2018-06-14 ENCOUNTER — Ambulatory Visit: Payer: BLUE CROSS/BLUE SHIELD | Admitting: Physician Assistant

## 2018-06-14 NOTE — Progress Notes (Signed)
CMP and CBC WNL. Uric acid is WNL

## 2018-06-14 NOTE — Progress Notes (Signed)
Dr D's patient

## 2018-06-18 ENCOUNTER — Encounter (HOSPITAL_BASED_OUTPATIENT_CLINIC_OR_DEPARTMENT_OTHER): Payer: Self-pay

## 2018-06-18 ENCOUNTER — Emergency Department (HOSPITAL_BASED_OUTPATIENT_CLINIC_OR_DEPARTMENT_OTHER)
Admission: EM | Admit: 2018-06-18 | Discharge: 2018-06-18 | Disposition: A | Discharge status: Home / Self Care | Payer: BLUE CROSS/BLUE SHIELD | Attending: Emergency Medicine | Admitting: Emergency Medicine

## 2018-06-18 ENCOUNTER — Other Ambulatory Visit: Payer: Self-pay

## 2018-06-18 DIAGNOSIS — Z79899 Other long term (current) drug therapy: Secondary | ICD-10-CM | POA: Insufficient documentation

## 2018-06-18 DIAGNOSIS — J019 Acute sinusitis, unspecified: Secondary | ICD-10-CM | POA: Diagnosis not present

## 2018-06-18 DIAGNOSIS — I10 Essential (primary) hypertension: Secondary | ICD-10-CM | POA: Diagnosis not present

## 2018-06-18 DIAGNOSIS — I2510 Atherosclerotic heart disease of native coronary artery without angina pectoris: Secondary | ICD-10-CM | POA: Diagnosis not present

## 2018-06-18 DIAGNOSIS — R432 Parageusia: Secondary | ICD-10-CM | POA: Diagnosis present

## 2018-06-18 DIAGNOSIS — K219 Gastro-esophageal reflux disease without esophagitis: Secondary | ICD-10-CM

## 2018-06-18 DIAGNOSIS — J3089 Other allergic rhinitis: Secondary | ICD-10-CM

## 2018-06-18 MED ORDER — FLUTICASONE PROPIONATE 50 MCG/ACT NA SUSP: 1.0000 | Freq: Every day | NASAL | 0 refills | Status: DC

## 2018-06-18 MED ORDER — PANTOPRAZOLE SODIUM 20 MG PO TBEC: 20.0000 mg | DELAYED_RELEASE_TABLET | Freq: Every day | ORAL | 0 refills | Status: DC

## 2018-06-18 MED ORDER — PREDNISONE 20 MG PO TABS: 40.0000 mg | ORAL_TABLET | Freq: Every day | ORAL | 0 refills | Status: AC

## 2018-06-18 NOTE — Discharge Instructions (Signed)
Use Flonase daily. Take prednisone as prescribed. Use Protonix daily for GERD symptoms. Follow-up with your primary care doctor for further evaluation of your symptoms.  Return to the ER with any new, worsening, or concerning symptoms.

## 2018-06-18 NOTE — ED Triage Notes (Signed)
Pt states she receives IV med treatments for her RA- last treatment was 1/7 and now has bruising/rash to left upper arm. Pt also reports having left ear pain and bleeding last night and now feels like she is swallowing blood.

## 2018-06-18 NOTE — ED Provider Notes (Signed)
[facility] EMERGENCY DEPARTMENT Provider Note   CSN: [id number] Arrival date & time: 06/18/18  2022     History   Chief Complaint No chief complaint on file.   HPI [name] [name] is a 53 y.o. female presenting for evaluation of left ear pain, dysgeusia, and rash of the left arm.  Patient states she gets infusions for RA.  Her last infusion was on 01/07.  2 days later, she noticed some spots on her left upper arm.  She denies trauma or injury.  She denies itching or pain at those areas.  No lesions noted elsewhere.  Today, she had left ear pain.  When she repeatedly used a Q-tip, she noticed blood on the end of the Q-tip.  Additionally, she has been having a strange taste in her throat.  It tastes metallic.  She reports increased GERD symptoms.  She was on Protonix, but has not been on any antacids recently.  She reports sinus pain/pressure for the past several days, has frequent sinusitis, often treated with either antibiotics or prednisone.  She denies fevers, chills, sore throat, chest pain, shortness of breath, nausea, vomiting, arm pain, urinary symptoms, normal bowel movements.  Additional history obtained from chart review.  Patient on Orencia IV for RA, last infusion on the 7th.  Additional history of anxiety, CAD, fibromyalgia, GERD.   HPI  Past Medical History:  Diagnosis Date  . Anxiety   . Coronary artery calcification seen on CAT scan    minimal CAD with 30% prox and mild LAD  . Depression   . Diastolic dysfunction   . Dyslipidemia 01/30/2015  . Esophageal ring   . Fibromyalgia   . Hiatal hernia   . HSV-1 infection   . Morbid obesity (HCC)   . Rheumatoid arthritis(714.0)    M05.79  . Rheumatoid arthritis, seropositive, 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HCC)    Treated with Orencia, TB neg 09/12/2015    Patient Active Problem List   Diagnosis Date Noted  . High risk medication use 08/03/2016  . Fibromyalgia syndrome 08/03/2016  . Abnormal cardiac CT angiography    . Sacrococcygeal pain 02/20/2016  . Rheumatoid arthritis involving 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HCC) 01/24/2016  . 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 Dyslipidemia 01/30/2015  . 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 Essential hypertension 08/25/2014  . Morbid obesity (HCC) 08/25/2014  . CAD (coronary artery disease), native coronary artery 08/24/2014  . Numbness and tingling in left arm   . Arm numbness left 08/22/2014  . Chest pain 08/22/2014  . Gastroesophageal reflux disease without esophagitis 07/23/2014  . Coronary artery calcification seen on CAT scan 02/19/2014  . Hip pain 10/23/2013  . Solitary pulmonary nodule 08/28/2013  . Chest pain, atypical 07/18/2013  . Heart palpitations 06/27/2013  . Cough 06/04/2013  . Diastolic dysfunction 05/18/2013  . SOB (shortness of breath) 05/09/2013  . Other malaise and fatigue 01/21/2013  . Stress and adjustment reaction 12/09/2012  . Right sided abdominal pain 11/29/2012  . 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/21/2012  . Morbid obesity with BMI of 50.0-59.9, adult (HCC) 11/21/2012  . Arthritis 07/13/2012  . Depression 07/13/2012  . Routine health maintenance 05/07/2012    Past Surgical History:  Procedure Laterality Date  . CARDIAC CATHETERIZATION N/A 06/11/2016   Procedure: Left Heart Cath and Coronary Angiography;  Surgeon: Corky Crafts, MD;  Location: [facility] INVASIVE CV LAB;  Service: Cardiovascular;  Laterality: N/A;  . CESAREAN SECTION    . COLONOSCOPY  07/27/2017  . ESOPHAGEAL MANOMETRY N/A 10/28/2014   Procedure: ESOPHAGEAL MANOMETRY (EM);  Surgeon: Hilarie Fredrickson, MD;  Location: [facility] ENDOSCOPY;  Service: Endoscopy;  Laterality: N/A;  . HERNIA REPAIR     reports surgery on 3 hernias, with 2 more present  . LAPAROSCOPIC GASTRIC SLEEVE RESECTION  11/21/12   [facility]  . LEFT HEART CATHETERIZATION WITH CORONARY ANGIOGRAM N/A 08/26/2014   Procedure: LEFT HEART CATHETERIZATION  WITH CORONARY ANGIOGRAM;  Surgeon: Runell Gess, MD;  Location: [facility] CATH LAB;  Service: Cardiovascular;  Laterality: N/A;  . TUBAL LIGATION       OB History   No obstetric history on file.      Home Medications    Prior to Admission medications   Medication Sig Start Date End Date Taking? Authorizing Provider  Abatacept (ORENCIA IV) Inject 1,000 mg into the vein every 28 (twenty-eight) days.    Yes [provider]  Adapalene 0.3 % gel Apply 1 application topically daily as needed (acne).  12/31/14  Yes [provider]  ARIPiprazole (ABILIFY) 2 MG tablet Take 1 tablet (2 mg total) by mouth daily. 04/22/15  Yes Benjaman Pott, MD  buPROPion (WELLBUTRIN XL) 300 MG 24 hr tablet Take 1 tablet (300 mg total) by mouth daily. 12/28/14  Yes Adonis Brook, NP  cetirizine (ZYRTEC) 10 MG tablet cetirizine 10 mg tablet 12/14/17  Yes [provider]  clindamycin-benzoyl peroxide (BENZACLIN) gel Apply 1 application topically daily as needed. Acne 02/13/15  Yes [provider]  cyclobenzaprine (FLEXERIL) 10 MG tablet Take 1 tablet by mouth daily at bedtime as needed for muscle spasms. 06/02/18  Yes Gearldine Bienenstock, PA-C  diclofenac sodium (VOLTAREN) 1 % GEL APPLY 3 GRAMS TO 3 LARGE JOINTS UP TO THREE TIMES A DAY AS NEEDED 02/07/18  Yes Deveshwar, Janalyn Rouse, MD  diclofenac sodium (VOLTAREN) 1 % GEL Apply topically. 05/02/13  Yes [provider]  diltiazem (CARDIZEM CD) 180 MG 24 hr capsule Take 1 capsule (180 mg total) by mouth daily. Please make yearly appt with Dr. Mayford Knife for January. 1st attempt 06/13/17  Yes Turner, Cornelious Bryant, MD  doxepin (SINEQUAN) 10 MG capsule Take 10 mg by mouth at bedtime as needed (sleep).   Yes [provider]  DULoxetine HCl 60 MG CSDR duloxetine 60 mg capsule,delayed release   Yes [provider]  famciclovir (FAMVIR) 500 MG tablet Take 1500mg  on the onset of fever blister for 2 or 3 days as needed for fever blisters  01/21/15  Yes [provider]  folic acid (FOLVITE) 1 MG tablet Take 2 tablets (2 mg total) by mouth daily. 04/26/18  Yes Deveshwar, Janalyn Rouse, MD  Folic Acid-Vit B6-Vit B12 (FABB) 2.2-25-1 MG TABS Take 1 tablet by mouth daily. 11/11/16  Yes Panwala, Naitik, PA-C  Folic Acid-Vit B6-Vit B12 (FABB) 2.2-25-1 MG TABS Take 2 tablets by mouth daily. 02/02/18  Yes Gearldine Bienenstock, PA-C  gabapentin (NEURONTIN) 100 MG capsule TAKE 1 CAPSULE BY MOUTH DAILY 06/01/18  Yes Gearldine Bienenstock, PA-C  hydrOXYzine (VISTARIL) 25 MG capsule Take 25 mg by mouth every 8 (eight) hours as needed for anxiety.  12/30/14  Yes [provider]  levothyroxine (SYNTHROID, LEVOTHROID) 25 MCG tablet Take 1 tablet (25 mcg total) by mouth daily before breakfast. 12/28/14  Yes Adonis Brook, NP  lidocaine (LIDODERM) 5 % Place 1 patch onto the skin daily. Remove & Discard patch within 12 hours or as directed by MD 01/23/18  Yes Tegeler, Canary Brim,  MD  RASUVO 25 MG/0.5ML SOAJ INJECT ONE PEN SUBCUTANEOUSLY ONCE EVERY WEEK. STORE AT ROOM TEMPERATURE BETWEEN 68 - 77 DEGREES F. 05/11/18  Yes Deveshwar, Janalyn Rouse, MD  tretinoin (RETIN-A) 0.025 % cream Apply 1 application topically as needed.  02/11/15  Yes [provider]  triamcinolone cream (KENALOG) 0.1 % Apply 1 application topically daily as needed. Itching 04/13/15  Yes [provider]  Vitamin D, Ergocalciferol, (DRISDOL) 1.25 MG (50000 UT) CAPS capsule ergocalciferol (vitamin D2) 1,250 mcg (50,000 unit) capsule   Yes [provider]  XERESE 5-1 % CREA Apply 1 application topically daily as needed (fever blisters).  12/30/14  Yes [provider]  Betamethasone Valerate 0.12 % foam Apply 1 application topically daily as needed. Itching 04/22/15   [provider]  clobetasol cream (TEMOVATE) 0.05 % Apply 1 application topically daily as needed. Skin discoloration 04/22/15   [provider]  EPIPEN 2-PAK 0.3 MG/0.3ML SOAJ injection  Inject 0.3 mg into the skin as needed (allergic reaction).  12/30/14   [provider]  fluticasone (FLONASE) 50 MCG/ACT nasal spray Place 1 spray into both nostrils daily. 06/18/18   Stephani Janak, PA-C  metoprolol tartrate (LOPRESSOR) 25 MG tablet Take 1 tablet (25 mg total) by mouth 2 (two) times daily. Patient not taking: Reported on 08/05/2016 06/04/16 09/02/16  Manson Passey, PA  nitroGLYCERIN (NITROSTAT) 0.4 MG SL tablet PLACE 1 TABLET UNDER THE TONGUE EVERY 5 MINUTES AS NEEDED FOR CHEST PAIN. 09/06/16   Quintella Reichert, MD  pantoprazole (PROTONIX) 20 MG tablet Take 1 tablet (20 mg total) by mouth daily. 06/18/18   Desmon Hitchner, PA-C  potassium chloride SA (K-DUR,KLOR-CON) 20 MEQ tablet Take by mouth. 02/20/18 02/21/19  [provider]  predniSONE (DELTASONE) 20 MG tablet Take 2 tablets (40 mg total) by mouth daily for 5 days. 06/18/18 06/23/18  Gaige Sebo, PA-C  rosuvastatin (CRESTOR) 20 MG tablet Take 20 mg by mouth. 09/21/16 06/01/18  [provider]    Family History Family History  Problem Relation Age of Onset  . Hyperlipidemia Mother   . Depression Mother   . Sarcoidosis Father   . Lung disease Father        Pleural Mesothelioma  . Cancer Father   . Heart attack Paternal Grandmother   . Hypertension Paternal Grandmother   . Hypertension Maternal Grandmother   . Diabetes Maternal Grandmother   . Stroke Neg Hx   . Colon cancer Neg Hx   . Esophageal cancer Neg Hx   . Rectal cancer Neg Hx   . Stomach cancer Neg Hx     Social History Social History   Tobacco Use  . Smoking status: Never Smoker  . Smokeless tobacco: Never Used  Substance Use Topics  . Alcohol use: No  . Drug use: No     Allergies   Influenza vaccines and Isosorbide   Review of Systems Review of Systems  HENT: Positive for ear pain and sinus pressure.        Dysgeusia  Skin: Positive for rash.  All other systems reviewed and are negative.    Physical  Exam Updated Vital Signs BP 136/75 (BP Location: Right Arm)   Pulse 67   Temp 98 F (36.7 C) (Oral)   Resp 18   Ht 5\' 2"  (1.575 m)   Wt 118.8 kg   SpO2 100%   BMI 47.92 kg/m   Physical Exam Vitals signs and nursing note reviewed.  Constitutional:      General:  She is not in acute distress.    Appearance: She is well-developed.     Comments: Obese female sitting comfortably in the bed in no acute distress  HENT:     Head: Normocephalic and atraumatic.     Right Ear: Tympanic membrane, ear canal and external ear normal.     Left Ear: Tympanic membrane and external ear normal.     Ears:     Comments: Scrape along the left ear canal without active bleeding.  EMs nonerythematous nonbulging bilaterally.    Nose: Congestion present.     Right Sinus: Maxillary sinus tenderness present.     Left Sinus: Maxillary sinus tenderness present.     Comments: Nasal congestion noted with tenderness palpation of bilateral maxillary sinuses.    Mouth/Throat:     Comments: OP clear without tonsillar swelling or exudate.  Uvula midline with equal palate rise. Eyes:     Extraocular Movements: Extraocular movements intact.     Conjunctiva/sclera: Conjunctivae normal.     Pupils: Pupils are equal, round, and reactive to light.  Neck:     Musculoskeletal: Normal range of motion and neck supple.  Cardiovascular:     Rate and Rhythm: Normal rate and regular rhythm.     Pulses: Normal pulses.  Pulmonary:     Effort: Pulmonary effort is normal. No respiratory distress.     Breath sounds: Normal breath sounds. No wheezing.  Abdominal:     General: There is no distension.     Palpations: Abdomen is soft. There is no mass.     Tenderness: There is no abdominal tenderness. There is no guarding or rebound.  Musculoskeletal: Normal range of motion.  Skin:    General: Skin is warm and dry.     Capillary Refill: Capillary refill takes less than 2 seconds.     Findings: Lesion present.     Comments: See  picture below.  4 discrete lesions of the proximal L arm. Small scabs on 2 lesions. Lesions consistent with contusions  Neurological:     Mental Status: She is alert and oriented to person, place, and time.          ED Treatments / Results  Labs (all labs ordered are listed, but only abnormal results are displayed) Labs Reviewed - No data to display  EKG None  Radiology No results found.  Procedures Procedures (including critical care time)  Medications Ordered in ED Medications - No data to display   Initial Impression / Assessment and Plan / ED Course  I have reviewed the triage vital signs and the nursing notes.  Pertinent labs & imaging results that were available during my care of the patient were reviewed by me and considered in my medical decision making (see chart for details).     Patient presenting for evaluation of left ear pain, sinus congestion, dysgeusia, and lesions in the left arm.  Physical examination, she appears nontoxic.  Lesions of the left arm consistent with contusions.  Small scabs over 2 lesions, consider possible itching/trauma.  Dysgeusia likely related to GERD, as patient reports increasing heartburn symptoms and is no longer on heartburn medicine.  Will restart on Protonix.  Left ear pain/bleeding likely related to ear canal trauma.  TM exam reassuring.  Additionally, patient was sinus congestion/tenderness.  She is afebrile, low suspicion for bacterial cause. Will given dose of prednisone and encourage flonase use. Pt ot f/u with PCP for further evaluation. At this time, pt appears safe for d/c. Return  precautions given. Pt states she understands and agrees to plan.    Final Clinical Impressions(s) / ED Diagnoses   Final diagnoses:  Gastroesophageal reflux disease, esophagitis presence not specified  Acute sinusitis, recurrence not specified, unspecified location    ED Discharge Orders         Ordered    fluticasone (FLONASE) 50 MCG/ACT  nasal spray  Daily     06/18/18 2201    pantoprazole (PROTONIX) 20 MG tablet  Daily     06/18/18 2201    predniSONE (DELTASONE) 20 MG tablet  Daily     06/18/18 2202           Alveria ApleyCaccavale, Katessa Attridge, PA-C 06/19/18 0141    Doug SouJacubowitz, Sam, MD 06/19/18 1051

## 2018-06-18 NOTE — ED Notes (Signed)
ED Provider at bedside. 

## 2018-06-21 ENCOUNTER — Ambulatory Visit: Payer: BLUE CROSS/BLUE SHIELD | Admitting: Rheumatology

## 2018-06-21 ENCOUNTER — Ambulatory Visit (INDEPENDENT_AMBULATORY_CARE_PROVIDER_SITE_OTHER): Payer: BLUE CROSS/BLUE SHIELD

## 2018-06-21 DIAGNOSIS — M79641 Pain in right hand: Secondary | ICD-10-CM

## 2018-06-21 DIAGNOSIS — M79642 Pain in left hand: Principal | ICD-10-CM

## 2018-06-21 NOTE — Progress Notes (Signed)
I discussed the ultrasound findings with patient.  She has failed to anti-TNF's in the past and is currently on Orencia.  We discussed the option of Actemra or Kevzara or small cell molecule agents.  She stated that she has appointment coming up with the gastroenterologist.  We decided to hold off medications until a diagnosis is established.  The above-mentioned medications will be contraindicated if she has diverticulitis.  Patient will contact us after the GI complete work-up is complete.  Pollyann Savoy, MD

## 2018-06-25 ENCOUNTER — Other Ambulatory Visit: Payer: Self-pay | Admitting: Physician Assistant

## 2018-06-26 NOTE — Telephone Encounter (Signed)
Last visit: 06/01/18 Next visit: 10/24/18  Okay to refill per Dr. Deveshwar 

## 2018-07-11 ENCOUNTER — Ambulatory Visit (HOSPITAL_COMMUNITY)
Admission: RE | Admit: 2018-07-11 | Discharge: 2018-07-11 | Disposition: A | Discharge status: Home / Self Care | Payer: BLUE CROSS/BLUE SHIELD | Source: Ambulatory Visit | Attending: Rheumatology | Admitting: Rheumatology

## 2018-07-11 DIAGNOSIS — M0579 Rheumatoid arthritis with rheumatoid factor of multiple sites without organ or systems involvement: Secondary | ICD-10-CM

## 2018-07-11 MED ORDER — DIPHENHYDRAMINE HCL 25 MG PO CAPS
ORAL_CAPSULE | ORAL | Status: AC
  Filled 2018-07-11: qty 1

## 2018-07-11 MED ORDER — SODIUM CHLORIDE 0.9 % IV SOLN
1000.0000 mg | INTRAVENOUS | Status: DC
  Administered 2018-07-11: 1000 mg via INTRAVENOUS
  Filled 2018-07-11: qty 40

## 2018-07-11 MED ORDER — ACETAMINOPHEN 325 MG PO TABS
ORAL_TABLET | ORAL | Status: AC
  Filled 2018-07-11: qty 2

## 2018-07-11 MED ORDER — DIPHENHYDRAMINE HCL 25 MG PO CAPS
25.0000 mg | ORAL_CAPSULE | ORAL | Status: DC
  Administered 2018-07-11: 25 mg via ORAL

## 2018-07-11 MED ORDER — ACETAMINOPHEN 325 MG PO TABS
650.0000 mg | ORAL_TABLET | ORAL | Status: DC
  Administered 2018-07-11: 650 mg via ORAL

## 2018-07-31 ENCOUNTER — Other Ambulatory Visit: Payer: Self-pay | Admitting: *Deleted

## 2018-07-31 NOTE — Progress Notes (Signed)
Infusion orders are current for patient CBC CMP Tylenol Benadryl appointments are up to date and follow up appointment  is scheduled TB gold not due yet.  

## 2018-08-08 ENCOUNTER — Inpatient Hospital Stay (HOSPITAL_COMMUNITY): Admission: RE | Admit: 2018-08-08 | Payer: Self-pay | Source: Ambulatory Visit

## 2018-08-14 ENCOUNTER — Other Ambulatory Visit: Payer: Self-pay | Admitting: Physician Assistant

## 2018-08-14 NOTE — Telephone Encounter (Signed)
Last visit: 06/01/18 Next visit: 10/24/18  Okay to refill per Dr. Corliss Skains

## 2018-08-24 ENCOUNTER — Other Ambulatory Visit: Payer: Self-pay | Admitting: Physician Assistant

## 2018-08-24 ENCOUNTER — Ambulatory Visit (HOSPITAL_COMMUNITY)
Admission: RE | Admit: 2018-08-24 | Discharge: 2018-08-24 | Disposition: A | Discharge status: Home / Self Care | Payer: BLUE CROSS/BLUE SHIELD | Source: Ambulatory Visit | Attending: Rheumatology | Admitting: Rheumatology

## 2018-08-24 ENCOUNTER — Other Ambulatory Visit: Payer: Self-pay

## 2018-08-24 DIAGNOSIS — M0579 Rheumatoid arthritis with rheumatoid factor of multiple sites without organ or systems involvement: Secondary | ICD-10-CM | POA: Insufficient documentation

## 2018-08-24 LAB — COMPREHENSIVE METABOLIC PANEL
ALT: 13 U/L (ref 0–44)
AST: 14 U/L — ABNORMAL LOW (ref 15–41)
Albumin: 3.2 g/dL — ABNORMAL LOW (ref 3.5–5.0)
Alkaline Phosphatase: 71 U/L (ref 38–126)
Anion gap: 5 (ref 5–15)
BUN: 11 mg/dL (ref 6–20)
CO2: 25 mmol/L (ref 22–32)
Calcium: 9.3 mg/dL (ref 8.9–10.3)
Chloride: 110 mmol/L (ref 98–111)
Creatinine, Ser: 0.86 mg/dL (ref 0.44–1.00)
GFR calc Af Amer: >60 mL/min (ref >60)
GFR calc non Af Amer: >60 mL/min (ref >60)
Glucose, Bld: 94 mg/dL (ref 70–99)
Potassium: 4 mmol/L (ref 3.5–5.1)
SODIUM: 140 mmol/L (ref 135–145)
Total Bilirubin: 0.4 mg/dL (ref 0.3–1.2)
Total Protein: 6.6 g/dL (ref 6.5–8.1)

## 2018-08-24 LAB — CBC
HCT: 42.9 % (ref 36.0–46.0)
Hemoglobin: 13.2 g/dL (ref 12.0–15.0)
MCH: 26.3 pg (ref 26.0–34.0)
MCHC: 30.8 g/dL (ref 30.0–36.0)
MCV: 85.6 fL (ref 80.0–100.0)
Platelets: 308 10*3/uL (ref 150–400)
RBC: 5.01 MIL/uL (ref 3.87–5.11)
RDW: 12.9 % (ref 11.5–15.5)
WBC: 8.8 10*3/uL (ref 4.0–10.5)
nRBC: 0 % (ref 0.0–0.2)

## 2018-08-24 MED ORDER — SODIUM CHLORIDE 0.9 % IV SOLN
1000.0000 mg | INTRAVENOUS | Status: DC
  Administered 2018-08-24: 1000 mg via INTRAVENOUS
  Filled 2018-08-24: qty 40

## 2018-08-24 MED ORDER — ACETAMINOPHEN 325 MG PO TABS
ORAL_TABLET | ORAL | Status: AC
  Administered 2018-08-24: 650 mg via ORAL
  Filled 2018-08-24: qty 2

## 2018-08-24 MED ORDER — DIPHENHYDRAMINE HCL 25 MG PO CAPS
ORAL_CAPSULE | ORAL | Status: AC
  Administered 2018-08-24: 25 mg via ORAL
  Filled 2018-08-24: qty 1

## 2018-08-24 MED ORDER — DIPHENHYDRAMINE HCL 25 MG PO CAPS
25.0000 mg | ORAL_CAPSULE | ORAL | Status: DC
  Administered 2018-08-24: 25 mg via ORAL

## 2018-08-24 MED ORDER — ACETAMINOPHEN 325 MG PO TABS
650.0000 mg | ORAL_TABLET | ORAL | Status: DC
  Administered 2018-08-24: 650 mg via ORAL

## 2018-08-24 NOTE — Progress Notes (Signed)
Stable

## 2018-08-24 NOTE — Telephone Encounter (Signed)
Last visit: 06/01/18 Next visit: 10/24/18  Okay to refill per Dr. Deveshwar 

## 2018-08-27 LAB — QUANTIFERON-TB GOLD PLUS (RQFGPL)
QuantiFERON Mitogen Value: 1.58 IU/mL
QuantiFERON Nil Value: 0.03 IU/mL
QuantiFERON TB1 Ag Value: 0.03 IU/mL
QuantiFERON TB2 Ag Value: 0.02 IU/mL

## 2018-08-27 LAB — QUANTIFERON-TB GOLD PLUS: QuantiFERON-TB Gold Plus: NEGATIVE

## 2018-08-28 ENCOUNTER — Telehealth (INDEPENDENT_AMBULATORY_CARE_PROVIDER_SITE_OTHER): Payer: BLUE CROSS/BLUE SHIELD | Admitting: Rheumatology

## 2018-08-28 ENCOUNTER — Encounter: Payer: Self-pay | Admitting: Rheumatology

## 2018-08-28 ENCOUNTER — Other Ambulatory Visit: Payer: Self-pay

## 2018-08-28 DIAGNOSIS — M170 Bilateral primary osteoarthritis of knee: Secondary | ICD-10-CM

## 2018-08-28 DIAGNOSIS — E785 Hyperlipidemia, unspecified: Secondary | ICD-10-CM

## 2018-08-28 DIAGNOSIS — G4709 Other insomnia: Secondary | ICD-10-CM

## 2018-08-28 DIAGNOSIS — M797 Fibromyalgia: Secondary | ICD-10-CM

## 2018-08-28 DIAGNOSIS — M7062 Trochanteric bursitis, left hip: Secondary | ICD-10-CM

## 2018-08-28 DIAGNOSIS — M0579 Rheumatoid arthritis with rheumatoid factor of multiple sites without organ or systems involvement: Secondary | ICD-10-CM

## 2018-08-28 DIAGNOSIS — R5383 Other fatigue: Secondary | ICD-10-CM

## 2018-08-28 DIAGNOSIS — Z8659 Personal history of other mental and behavioral disorders: Secondary | ICD-10-CM

## 2018-08-28 DIAGNOSIS — Z79899 Other long term (current) drug therapy: Secondary | ICD-10-CM

## 2018-08-28 DIAGNOSIS — M7061 Trochanteric bursitis, right hip: Secondary | ICD-10-CM | POA: Diagnosis not present

## 2018-08-28 DIAGNOSIS — Z8679 Personal history of other diseases of the circulatory system: Secondary | ICD-10-CM

## 2018-08-28 MED ORDER — PREDNISONE 5 MG PO TABS: ORAL_TABLET | ORAL | 0 refills | Status: DC

## 2018-08-28 MED ORDER — LIDOCAINE 5 % EX PTCH: 1.0000 | MEDICATED_PATCH | CUTANEOUS | 0 refills | Status: DC

## 2018-08-28 NOTE — Progress Notes (Signed)
Virtual Visit via Video Note  I connected with [name] [name] on 08/28/18 at  2:45 PM EDT by a video enabled telemedicine application and verified that I am speaking with the correct person using two identifiers.   I discussed the limitations of evaluation and management by telemedicine and the availability of in person appointments. The patient expressed understanding and agreed to proceed. Chief complaint: Pain and swelling in multiple joints History of Present Illness: Patient states that she had her IV Orencia infusion last week on Thursday.  She started having pain and swelling in multiple joints yesterday.  She states she is unable to make a fist on her right arm.  Her hands are swollen.  Her bilateral knee joints are swollen.  She believes the symptoms started because of increased anxiety.  She states she called her PCP and added Valium.  She was already on Abilify and Wellbutrin XR.  She denies any fever or any other symptoms. There is no history of oral ulcers, nasal ulcers, malar rash, photosensitivity, chest pain, palpitations or rash.  Patient believes her anxiety symptoms have improved since she is been taking Valium.  She has not missed any dosage of her medications.  Medications were reviewed.    Observations/Objective: Review of Systems  Constitutional: Negative for chills and fever.  Respiratory: Negative for cough and shortness of breath.   Cardiovascular: Negative for chest pain and palpitations.  Genitourinary: Negative for frequency.  Musculoskeletal: Positive for joint pain. Negative for myalgias.       Morning stiffness and joint swelling  Neurological: Negative for headaches.  Psychiatric/Behavioral:       Positive for anxiety  activities of daily living: Patient reports difficulty climbing the stairs and getting up from the chair.  She is having difficulty making a fist. She reports swelling in her bilateral hands and bilateral knee joints.   Assessment and  Plan: Rheumatoid arthritis involving multiple sites with positive rheumatoid factor-patient is having a flare with the increased pain and swelling in multiple joints.  She is currently on IV Orencia which she received last week and also on Rasuvo 25 mg subcu weekly.  She believes the flare is related to the increased anxiety and stress.  We discussed possible prednisone taper to help her with the flare.  Indication side effects contraindications were discussed.  Have given her prescription for prednisone starting at 20 mg p.o. daily and taper by 5 mg every 4 days.  If she has recurrent flares she supposed to notify us. High risk prescription-IV Orencia and Rasuvo 25 mg subcu weekly.  CBC and CMP has been stable.  Her labs are checked with her infusions.  Labs were reviewed with her today. Primary osteoarthritis of both knees-patient continues to have pain and discomfort in her knee joints.  She states her knee joints are currently swollen. Trochanteric bursitis of both hips-patient requests Lidoderm patches.  She states her bilateral trochanteric bursa has been painful. Fibromyalgia-she continues to have generalized pain and discomfort from fibromyalgia.  She also gives history of insomnia and fatigue due to fibromyalgia. History of depression and anxiety-patient reports that her anxiety has been flaring.  She has been taking Abilify and Wellbutrin XR.  She was recently given Ativan by her PCP.  She states her anxiety symptoms are getting better.  Other medical problems are listed as follows:  History of coronary artery disease History of hypertension History of dyslipidemia History of diastolic dysfunction  Follow Up Instructions: Patient will return for follow-up as  scheduled.  I discussed the assessment and treatment plan with the patient. The patient was provided an opportunity to ask questions and all were answered. The patient agreed with the plan and demonstrated an understanding of the  instructions.   The patient was advised to call back or seek an in-person evaluation if the symptoms worsen or if the condition fails to improve as anticipated.  I provided 32 minutes of non-face-to-face time during this encounter.   Pollyann Savoy, MD

## 2018-08-28 NOTE — Addendum Note (Signed)
Addended by: Gearldine Bienenstock on: 08/28/2018 03:54 PM   Modules accepted: Orders

## 2018-09-04 ENCOUNTER — Encounter (HOSPITAL_COMMUNITY): Payer: Self-pay

## 2018-09-05 ENCOUNTER — Encounter (HOSPITAL_COMMUNITY): Payer: Self-pay

## 2018-09-14 ENCOUNTER — Other Ambulatory Visit: Payer: Self-pay | Admitting: *Deleted

## 2018-09-14 NOTE — Progress Notes (Signed)
Infusion orders are current for patient CBC CMP Tylenol Benadryl appointments are up to date and follow up appointment  is scheduled TB gold not due yet.  

## 2018-09-20 ENCOUNTER — Other Ambulatory Visit: Payer: Self-pay

## 2018-09-21 ENCOUNTER — Ambulatory Visit (HOSPITAL_COMMUNITY)
Admission: RE | Admit: 2018-09-21 | Discharge: 2018-09-21 | Disposition: A | Discharge status: Home / Self Care | Payer: BLUE CROSS/BLUE SHIELD | Source: Ambulatory Visit | Attending: Rheumatology | Admitting: Rheumatology

## 2018-09-21 DIAGNOSIS — M0579 Rheumatoid arthritis with rheumatoid factor of multiple sites without organ or systems involvement: Secondary | ICD-10-CM | POA: Diagnosis present

## 2018-09-21 MED ORDER — ACETAMINOPHEN 325 MG PO TABS
ORAL_TABLET | ORAL | Status: AC
  Filled 2018-09-21: qty 2

## 2018-09-21 MED ORDER — SODIUM CHLORIDE 0.9 % IV SOLN
1000.0000 mg | INTRAVENOUS | Status: DC
  Administered 2018-09-21: 1000 mg via INTRAVENOUS
  Filled 2018-09-21 (×2): qty 40

## 2018-09-21 MED ORDER — DIPHENHYDRAMINE HCL 25 MG PO CAPS
ORAL_CAPSULE | ORAL | Status: AC
  Administered 2018-09-21: 25 mg
  Filled 2018-09-21: qty 1

## 2018-09-21 MED ORDER — DIPHENHYDRAMINE HCL 25 MG PO CAPS: 25.0000 mg | ORAL_CAPSULE | ORAL | Status: DC

## 2018-09-21 MED ORDER — ACETAMINOPHEN 325 MG PO TABS
650.0000 mg | ORAL_TABLET | ORAL | Status: DC
  Administered 2018-09-21: 650 mg via ORAL

## 2018-10-06 ENCOUNTER — Other Ambulatory Visit: Payer: Self-pay | Admitting: *Deleted

## 2018-10-06 NOTE — Progress Notes (Signed)
Infusion orders are current for patient CBC CMP Tylenol Benadryl appointments are up to date and follow up appointment  is scheduled TB gold not due yet.  

## 2018-10-12 ENCOUNTER — Other Ambulatory Visit: Payer: Self-pay | Admitting: Physician Assistant

## 2018-10-12 NOTE — Telephone Encounter (Signed)
Last visit: 06/01/18 Next visit: 10/24/18  Okay to refill per Dr. Corliss Skains

## 2018-10-17 NOTE — Progress Notes (Deleted)
Office Visit Note  Patient: [name] [name]             Date of Birth: February 02, 1966           MRN: [id number]             PCP: [name], MD Referring: [name], MD Visit Date: 10/24/2018 Occupation: @GUAROCC @  Subjective:  No chief complaint on file.   History of Present Illness: [name] [name] is a 53 y.o. female ***   Activities of Daily Living:  Patient reports morning stiffness for *** {minute/hour:19697}.   Patient {ACTIONS;DENIES/REPORTS:21021675::"Denies"} nocturnal pain.  Difficulty dressing/grooming: {ACTIONS;DENIES/REPORTS:21021675::"Denies"} Difficulty climbing stairs: {ACTIONS;DENIES/REPORTS:21021675::"Denies"} Difficulty getting out of chair: {ACTIONS;DENIES/REPORTS:21021675::"Denies"} Difficulty using hands for taps, buttons, cutlery, and/or writing: {ACTIONS;DENIES/REPORTS:21021675::"Denies"}  No Rheumatology ROS completed.   PMFS History:  Patient Active Problem List   Diagnosis Date Noted  . High risk medication use 08/03/2016  . Fibromyalgia syndrome 08/03/2016  . Abnormal cardiac CT angiography   . Sacrococcygeal pain 02/20/2016  . Rheumatoid arthritis involving 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HCC) 01/24/2016  . 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 Dyslipidemia 01/30/2015  . 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 Essential hypertension 08/25/2014  . Morbid obesity (HCC) 08/25/2014  . CAD (coronary artery disease), native coronary artery 08/24/2014  . Numbness and tingling in left arm   . Arm numbness left 08/22/2014  . Chest pain 08/22/2014  . Gastroesophageal reflux disease without esophagitis 07/23/2014  . Coronary artery calcification seen on CAT scan 02/19/2014  . Hip pain 10/23/2013  . Solitary pulmonary nodule 08/28/2013  . Chest pain, atypical 07/18/2013  . Heart palpitations 06/27/2013  . Cough 06/04/2013  . Diastolic dysfunction 05/18/2013  . SOB (shortness of breath) 05/09/2013   . Other malaise and fatigue 01/21/2013  . Stress and adjustment reaction 12/09/2012  . Right sided abdominal pain 11/29/2012  . 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/21/2012  . Morbid obesity with BMI of 50.0-59.9, adult (HCC) 11/21/2012  . Arthritis 07/13/2012  . Depression 07/13/2012  . Routine health maintenance 05/07/2012    Past Medical History:  Diagnosis Date  . Anxiety   . Coronary artery calcification seen on CAT scan    minimal CAD with 30% prox and mild LAD  . Depression   . Diastolic dysfunction   . Dyslipidemia 01/30/2015  . Esophageal ring   . Fibromyalgia   . Hiatal hernia   . HSV-1 infection   . Morbid obesity (HCC)   . Rheumatoid arthritis(714.0)    M05.79  . Rheumatoid arthritis, seropositive, 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HCC)    Treated with Orencia, TB neg 09/12/2015    Family History  Problem Relation Age of Onset  . Hyperlipidemia Mother   . Depression Mother   . Sarcoidosis Father   . Lung disease Father        Pleural Mesothelioma  . Cancer Father   . Heart attack Paternal Grandmother   . Hypertension Paternal Grandmother   . Hypertension Maternal Grandmother   . Diabetes Maternal Grandmother   . Stroke Neg Hx   . Colon cancer Neg Hx   . Esophageal cancer Neg Hx   . Rectal cancer Neg Hx   . Stomach cancer Neg Hx    Past Surgical History:  Procedure Laterality Date  . CARDIAC CATHETERIZATION N/A 06/11/2016   Procedure: Left Heart Cath and Coronary Angiography;  Surgeon: Corky Crafts, MD;  Location: [facility] INVASIVE CV LAB;  Service: Cardiovascular;  Laterality: N/A;  .  CESAREAN SECTION    . COLONOSCOPY  07/27/2017  . ESOPHAGEAL MANOMETRY N/A 10/28/2014   Procedure: ESOPHAGEAL MANOMETRY (EM);  Surgeon: Hilarie Fredrickson, MD;  Location: [facility] ENDOSCOPY;  Service: Endoscopy;  Laterality: N/A;  . HERNIA REPAIR     reports surgery on 3 hernias, with 2 more present  . LAPAROSCOPIC GASTRIC SLEEVE RESECTION  11/21/12   [facility]  . LEFT HEART  CATHETERIZATION WITH CORONARY ANGIOGRAM N/A 08/26/2014   Procedure: LEFT HEART CATHETERIZATION WITH CORONARY ANGIOGRAM;  Surgeon: Runell Gess, MD;  Location: [facility] CATH LAB;  Service: Cardiovascular;  Laterality: N/A;  . TUBAL LIGATION     Social History   Social History Narrative   Marital Status: Married Probation officer)    Children:  Son [name]) Daughter [name])    Pets: None    Living Situation: Lives with husband and children    Occupation: Occupational psychologist Administrator)     Education: Oncologist (Psychology)     Tobacco Use/Exposure:  None    Alcohol Use:  Occasional   Drug Use:  None   Diet:  Regular   Exercise: Walking or Treadmill (2 x week)   Hobbies: Clinical cytogeneticist, [employer]ations.                There is no immunization history on file for this patient.   Objective: Vital Signs: There were no vitals taken for this visit.   Physical Exam   Musculoskeletal Exam: ***  CDAI Exam: CDAI Score: Not documented Patient Global Assessment: Not documented; Provider Global Assessment: Not documented Swollen: Not documented; Tender: Not documented Joint Exam   Not documented   There is currently no information documented on the homunculus. Go to the Rheumatology activity and complete the homunculus joint exam.  Investigation: No additional findings.  Imaging: No results found.  Recent Labs: Lab Results  Component Value Date   WBC 8.8 08/24/2018   HGB 13.2 08/24/2018   PLT 308 08/24/2018   NA 140 08/24/2018   K 4.0 08/24/2018   CL 110 08/24/2018   CO2 25 08/24/2018   GLUCOSE 94 08/24/2018   BUN 11 08/24/2018   CREATININE 0.86 08/24/2018   BILITOT 0.4 08/24/2018   ALKPHOS 71 08/24/2018   AST 14 (L) 08/24/2018   ALT 13 08/24/2018   PROT 6.6 08/24/2018   ALBUMIN 3.2 (L) 08/24/2018   CALCIUM 9.3 08/24/2018   GFRAA >60 08/24/2018   QFTBGOLD Negative 09/01/2016   QFTBGOLDPLUS Negative 08/24/2018    Speciality Comments: ORENCIA 1000  mg x 4 weeks TB GOLD negative 09/01/16  Procedures:  No procedures performed Allergies: Influenza vaccines and Isosorbide   Assessment / Plan:     Visit Diagnoses: No diagnosis found.   Orders: No orders of the defined types were placed in this encounter.  No orders of the defined types were placed in this encounter.   Face-to-face time spent with patient was *** minutes. Greater than 50% of time was spent in counseling and coordination of care.  Follow-Up Instructions: No follow-ups on file.   Ellen Henri, CMA  Note - This record has been created using Animal nutritionist.  Chart creation errors have been sought, but may not always  have been located. Such creation errors do not reflect on  the standard of medical care.

## 2018-10-18 ENCOUNTER — Other Ambulatory Visit: Payer: Self-pay

## 2018-10-19 ENCOUNTER — Ambulatory Visit (HOSPITAL_COMMUNITY)
Admission: RE | Admit: 2018-10-19 | Discharge: 2018-10-19 | Disposition: A | Discharge status: Home / Self Care | Payer: BLUE CROSS/BLUE SHIELD | Source: Ambulatory Visit | Attending: Rheumatology | Admitting: Rheumatology

## 2018-10-19 DIAGNOSIS — M0579 Rheumatoid arthritis with rheumatoid factor of multiple sites without organ or systems involvement: Secondary | ICD-10-CM | POA: Diagnosis present

## 2018-10-19 LAB — COMPREHENSIVE METABOLIC PANEL
ALT: 18 U/L (ref 0–44)
AST: 22 U/L (ref 15–41)
Albumin: 3.4 g/dL — ABNORMAL LOW (ref 3.5–5.0)
Alkaline Phosphatase: 69 U/L (ref 38–126)
Anion gap: 8 (ref 5–15)
BUN: 11 mg/dL (ref 6–20)
CO2: 27 mmol/L (ref 22–32)
Calcium: 9.4 mg/dL (ref 8.9–10.3)
Chloride: 107 mmol/L (ref 98–111)
Creatinine, Ser: 0.97 mg/dL (ref 0.44–1.00)
Glucose, Bld: 104 mg/dL — ABNORMAL HIGH (ref 70–99)
Potassium: 3.5 mmol/L (ref 3.5–5.1)
Sodium: 142 mmol/L (ref 135–145)
Total Bilirubin: 0.3 mg/dL (ref 0.3–1.2)
Total Protein: 6.6 g/dL (ref 6.5–8.1)

## 2018-10-19 LAB — CBC
HCT: 42.9 % (ref 36.0–46.0)
Hemoglobin: 13.3 g/dL (ref 12.0–15.0)
MCH: 26.8 pg (ref 26.0–34.0)
MCHC: 31 g/dL (ref 30.0–36.0)
MCV: 86.3 fL (ref 80.0–100.0)
Platelets: 265 10*3/uL (ref 150–400)
RBC: 4.97 MIL/uL (ref 3.87–5.11)
RDW: 12.3 % (ref 11.5–15.5)
WBC: 8 10*3/uL (ref 4.0–10.5)
nRBC: 0 % (ref 0.0–0.2)

## 2018-10-19 MED ORDER — SODIUM CHLORIDE 0.9 % IV SOLN
1000.0000 mg | INTRAVENOUS | Status: DC
  Administered 2018-10-19: 1000 mg via INTRAVENOUS
  Filled 2018-10-19 (×2): qty 40

## 2018-10-19 MED ORDER — ACETAMINOPHEN 325 MG PO TABS
ORAL_TABLET | ORAL | Status: AC
  Filled 2018-10-19: qty 2

## 2018-10-19 MED ORDER — DIPHENHYDRAMINE HCL 25 MG PO CAPS
ORAL_CAPSULE | ORAL | Status: AC
  Filled 2018-10-19: qty 1

## 2018-10-19 MED ORDER — DIPHENHYDRAMINE HCL 25 MG PO CAPS
25.0000 mg | ORAL_CAPSULE | ORAL | Status: DC
  Administered 2018-10-19: 25 mg via ORAL

## 2018-10-19 MED ORDER — ACETAMINOPHEN 325 MG PO TABS
650.0000 mg | ORAL_TABLET | ORAL | Status: DC
  Administered 2018-10-19: 650 mg via ORAL

## 2018-10-19 NOTE — Progress Notes (Signed)
CBC normal

## 2018-10-24 ENCOUNTER — Ambulatory Visit: Payer: Self-pay | Admitting: Rheumatology

## 2018-10-26 NOTE — Progress Notes (Signed)
Virtual Visit via Video Note  I connected with [name] [name] on 10/27/18 at 12:30 PM EDT by a video enabled telemedicine application and verified that I am speaking with the correct person using two identifiers.  Location: Patient: Home  Provider: Clinic  This service was conducted via virtual visit.  Both audio and visual tools were used.  The patient was located at home. I was located in my office.  Consent was obtained prior to the virtual visit and is aware of possible charges through their insurance for this visit.  The patient is an established patient.  Dr. Corliss Skainseveshwar, MD conducted the virtual visit and Sherron Alesaylor Chloe Flis, PA-C acted as scribe during the service.  Office staff helped with scheduling follow up visits after the service was conducted.    I discussed the limitations of evaluation and management by telemedicine and the availability of in person appointments. The patient expressed understanding and agreed to proceed.  CC: Pain in both hands History of Present Illness: Patient is a 53 year old female with a past medical history of seropositive rheumatoid arthritis, osteoarthritis, and fibromyalgia.  She receives IV Orencia infusions and Rasuvo 25 mg subcutaneous weekly injections.  She had  rheumatoid arthritis flare in mid March and was started on a prednisone taper starting at 20 mg and tapering by 5 mg every 4 days  She had active synovitis on ultrasound on 06/21/18.  She was advised to follow up with GI to be cleared before starting on a new medication.  She is having pain in both hands and both ankle joints.  She reports swelling in both hands.  She denies any ankle joint swelling.  She is having generalized muscle aches and tenderness due to fibromyalgia.  She has chronic fatigue related to insomnia.   Review of Systems  Constitutional: Positive for malaise/fatigue. Negative for fever.  Eyes: Negative for photophobia, pain, discharge and redness.  Respiratory: Negative for  cough, shortness of breath and wheezing.   Cardiovascular: Negative for chest pain and palpitations.  Gastrointestinal: Negative for blood in stool, constipation and diarrhea.  Genitourinary: Negative for dysuria.  Musculoskeletal: Positive for joint pain and myalgias. Negative for back pain and neck pain.       +Joint swelling +Morning stiffness   Skin: Negative for rash.  Neurological: Negative for dizziness and headaches.  Psychiatric/Behavioral: Positive for depression. The patient is nervous/anxious and has insomnia.       Observations/Objective: Physical Exam  Constitutional: She is oriented to person, place, and time and well-developed, well-nourished, and in no distress.  HENT:  Head: Normocephalic and atraumatic.  Eyes: Conjunctivae are normal.  Pulmonary/Chest: Effort normal.  Neurological: She is alert and oriented to person, place, and time.  Psychiatric: Mood, memory, affect and judgment normal.   Patient reports morning stiffness for 30  minutes.   Patient reports nocturnal pain.  Difficulty dressing/grooming: Denies Difficulty climbing stairs: Reports Difficulty getting out of chair: Reports Difficulty using hands for taps, buttons, cutlery, and/or writing: Reports   Assessment and Plan: Rheumatoid arthritis involving multiple sites with positive rheumatoid factor (HCC): She had an ultrasound of both hands on 06/21/18 that revealed ongoing synovitis despite receiving IV Orencia infusions and Rasuvo 25 mg sq injections. She has intermittent pain in both hands and both ankle joints.  She has occasional swelling in both hands.  She attributes most of her pain to fibromyalgia. She has failed Anti-TNFs in the past.  We previously discussed switching her to Luther RedoActemra, Kevzara, or a small  molecule agent, but she was advised to follow up with GI for symptoms she was experiencing prior to switching treatments.   She had a colonoscopy on 07/07/18 that revealed a few small scattered  diverticula.  She has never had acute diverticulitis.  She is apprehensive to make any medication changes at this time.  She will continue receiving IV Orencia infusions and injecting Rasuvo 25 mg sq weekly.  She was advised to notify us if she develops increased joint pain or joint swelling.  She will follow up in 3 months.   High risk medication use - Orencia IV infusions, Rasuvo 25 subcutaneous weekly injections, folic acid 2mg  po qd. CBC and CMP were drawn on 10/19/18.  TB gold negative on 08/24/18.    Primary osteoarthritis of both knees: She has no knee joint pain or joint swelling.  She has difficulty climbing steps and getting up from a chair due to pain in both knee joints and both ankle joints.  Fibromyalgia: she is currently having a fibromyalgia flare, which she attributes to recent weather changes. She takes gabapentin 100 mg by mouth daily and Flexeril 10 mg by mouth at bedtime to help with muscle spasms.  She has generalized muscle aches and muscle tenderness.  She has chronic fatigue related to insomnia.  She was encouraged to stay active and exercise on a regular basis.   Trochanteric bursitis of both hips: She has tenderness over trochanteric bursa bilaterally.  She was encouraged to perform stretching exercises.   Other insomnia: She experiences nocturnal pain that worsens her insomnia. She continues to take Flexeril 10 mg by mouth at bedtime.  Other fatigue: She has chronic fatigue related to insomnia   Follow Up Instructions: She will follow up in 3 months.  Refills for llidoderm patches and voltaren gel were sent to the pharmacy.    I discussed the assessment and treatment plan with the patient. The patient was provided an opportunity to ask questions and all were answered. The patient agreed with the plan and demonstrated an understanding of the instructions.   The patient was advised to call back or seek an in-person evaluation if the symptoms worsen or if the  condition fails to improve as anticipated.  I provided 30 minutes of non-face-to-face time during this encounter.  Pollyann Savoy, MD   Scribed by-  Gearldine Bienenstock, PA-C

## 2018-10-27 ENCOUNTER — Telehealth (INDEPENDENT_AMBULATORY_CARE_PROVIDER_SITE_OTHER): Payer: BLUE CROSS/BLUE SHIELD | Admitting: Rheumatology

## 2018-10-27 ENCOUNTER — Other Ambulatory Visit: Payer: Self-pay

## 2018-10-27 ENCOUNTER — Encounter (HOSPITAL_BASED_OUTPATIENT_CLINIC_OR_DEPARTMENT_OTHER): Payer: Self-pay | Admitting: *Deleted

## 2018-10-27 ENCOUNTER — Emergency Department (HOSPITAL_BASED_OUTPATIENT_CLINIC_OR_DEPARTMENT_OTHER): Payer: BLUE CROSS/BLUE SHIELD

## 2018-10-27 ENCOUNTER — Encounter: Payer: Self-pay | Admitting: Rheumatology

## 2018-10-27 ENCOUNTER — Emergency Department (HOSPITAL_BASED_OUTPATIENT_CLINIC_OR_DEPARTMENT_OTHER)
Admission: EM | Admit: 2018-10-27 | Discharge: 2018-10-27 | Disposition: A | Discharge status: Home / Self Care | Payer: BLUE CROSS/BLUE SHIELD | Attending: Emergency Medicine | Admitting: Emergency Medicine

## 2018-10-27 DIAGNOSIS — I2510 Atherosclerotic heart disease of native coronary artery without angina pectoris: Secondary | ICD-10-CM | POA: Diagnosis not present

## 2018-10-27 DIAGNOSIS — Y92000 Kitchen of unspecified non-institutional (private) residence as  the place of occurrence of the external cause: Secondary | ICD-10-CM | POA: Diagnosis not present

## 2018-10-27 DIAGNOSIS — W19XXXA Unspecified fall, initial encounter: Secondary | ICD-10-CM

## 2018-10-27 DIAGNOSIS — Y939 Activity, unspecified: Secondary | ICD-10-CM | POA: Insufficient documentation

## 2018-10-27 DIAGNOSIS — M170 Bilateral primary osteoarthritis of knee: Secondary | ICD-10-CM

## 2018-10-27 DIAGNOSIS — R5383 Other fatigue: Secondary | ICD-10-CM

## 2018-10-27 DIAGNOSIS — Y999 Unspecified external cause status: Secondary | ICD-10-CM | POA: Insufficient documentation

## 2018-10-27 DIAGNOSIS — W010XXA Fall on same level from slipping, tripping and stumbling without subsequent striking against object, initial encounter: Secondary | ICD-10-CM | POA: Insufficient documentation

## 2018-10-27 DIAGNOSIS — M79644 Pain in right finger(s): Secondary | ICD-10-CM | POA: Insufficient documentation

## 2018-10-27 DIAGNOSIS — S0990XA Unspecified injury of head, initial encounter: Secondary | ICD-10-CM | POA: Insufficient documentation

## 2018-10-27 DIAGNOSIS — Z8679 Personal history of other diseases of the circulatory system: Secondary | ICD-10-CM

## 2018-10-27 DIAGNOSIS — E785 Hyperlipidemia, unspecified: Secondary | ICD-10-CM

## 2018-10-27 DIAGNOSIS — M797 Fibromyalgia: Secondary | ICD-10-CM

## 2018-10-27 DIAGNOSIS — Z8639 Personal history of other endocrine, nutritional and metabolic disease: Secondary | ICD-10-CM

## 2018-10-27 DIAGNOSIS — M79671 Pain in right foot: Secondary | ICD-10-CM

## 2018-10-27 DIAGNOSIS — R911 Solitary pulmonary nodule: Secondary | ICD-10-CM

## 2018-10-27 DIAGNOSIS — Z79899 Other long term (current) drug therapy: Secondary | ICD-10-CM | POA: Insufficient documentation

## 2018-10-27 DIAGNOSIS — M0579 Rheumatoid arthritis with rheumatoid factor of multiple sites without organ or systems involvement: Secondary | ICD-10-CM

## 2018-10-27 DIAGNOSIS — Y92009 Unspecified place in unspecified non-institutional (private) residence as the place of occurrence of the external cause: Secondary | ICD-10-CM

## 2018-10-27 DIAGNOSIS — G4709 Other insomnia: Secondary | ICD-10-CM

## 2018-10-27 DIAGNOSIS — Z8659 Personal history of other mental and behavioral disorders: Secondary | ICD-10-CM

## 2018-10-27 DIAGNOSIS — Z9884 Bariatric surgery status: Secondary | ICD-10-CM

## 2018-10-27 DIAGNOSIS — M7061 Trochanteric bursitis, right hip: Secondary | ICD-10-CM

## 2018-10-27 MED ORDER — DICLOFENAC SODIUM 1 % TD GEL: TRANSDERMAL | 2 refills | Status: DC

## 2018-10-27 MED ORDER — LIDOCAINE 5 % EX PTCH: 1.0000 | MEDICATED_PATCH | CUTANEOUS | 0 refills | Status: DC

## 2018-10-27 MED ORDER — ACETAMINOPHEN 500 MG PO TABS
1000.0000 mg | ORAL_TABLET | Freq: Once | ORAL | Status: AC
  Administered 2018-10-27: 20:00:00 1000 mg via ORAL
  Filled 2018-10-27: qty 2

## 2018-10-27 NOTE — ED Provider Notes (Signed)
[facility] EMERGENCY DEPARTMENT Provider Note   CSN: [id number] Arrival date & time: 10/27/18  1704    History   Chief Complaint Chief Complaint  Patient presents with   Fall    HPI [name] [name] is a 53 y.o. female with a past medical history of rheumatoid arthritis, morbid obesity, CAD with diastolic function, anxiety, fibromyalgia, who presents today for evaluation after a fall.  She reports that approximately 1 hour prior to arrival she was in her kitchen and she slipped on detergent causing her to fall and strike the back of her head on the linoleum/cement ground.  She does not take any blood thinners.  She reports that she did not pass out however she felt very dazed and that it took her "a while" to be able to get up off the ground.  She is concerned that an area on the back of her head feels soft/boggy.  She denies any bleeding or confusion.  She reports that the right side of her jaw hurts along the right side of her face hurts.  She feels like her teeth do not line up like they normally do and has pain when she attempts to close her jaw tight.  This is new from the fall  She also reports pain in her right thumb and on the right side foot on the lateral aspect.  She denies any other injuries from this.  No new numbness or weakness.     HPI  Past Medical History:  Diagnosis Date   Anxiety    Coronary artery calcification seen on CAT scan    minimal CAD with 30% prox and mild LAD   Depression    Diastolic dysfunction    Dyslipidemia 01/30/2015   Esophageal ring    Fibromyalgia    Hiatal hernia    HSV-1 infection    Morbid obesity (HCC)    Rheumatoid arthritis(714.0)    M05.79   Rheumatoid arthritis, seropositive, 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HCC)    Treated with Orencia, TB neg 09/12/2015    Patient Active Problem List   Diagnosis Date Noted   High risk medication use 08/03/2016   Fibromyalgia syndrome 08/03/2016   Abnormal cardiac CT  angiography    Sacrococcygeal pain 02/20/2016   Rheumatoid arthritis involving 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HCC) 01/24/2016   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   Dyslipidemia 01/30/2015   MDD (major depressive disorder), recurrent severe, 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   Essential hypertension 08/25/2014   Morbid obesity (HCC) 08/25/2014   CAD (coronary artery disease), native coronary artery 08/24/2014   Numbness and tingling in left arm    Arm numbness left 08/22/2014   Chest pain 08/22/2014   Gastroesophageal reflux disease without esophagitis 07/23/2014   Coronary artery calcification seen on CAT scan 02/19/2014   Hip pain 10/23/2013   Solitary pulmonary nodule 08/28/2013   Chest pain, atypical 07/18/2013   Heart palpitations 06/27/2013   Cough 06/04/2013   Diastolic dysfunction 05/18/2013   SOB (shortness of breath) 05/09/2013   Other malaise and fatigue 01/21/2013   Stress and adjustment reaction 12/09/2012   Right sided abdominal pain 11/29/2012   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/21/2012   Morbid obesity with BMI of 50.0-59.9, adult (HCC) 11/21/2012   Arthritis 07/13/2012   Depression 07/13/2012   Routine health maintenance 05/07/2012    Past Surgical History:  Procedure Laterality Date   CARDIAC CATHETERIZATION N/A 06/11/2016   Procedure: Left Heart Cath and Coronary Angiography;  Surgeon: Corky Crafts, MD;  Location: [facility] INVASIVE CV LAB;  Service: Cardiovascular;  Laterality: N/A;   CESAREAN SECTION     COLONOSCOPY  07/27/2017   ESOPHAGEAL MANOMETRY N/A 10/28/2014   Procedure: ESOPHAGEAL MANOMETRY (EM);  Surgeon: Hilarie FredricksonJohn N Perry, MD;  Location: [facility] ENDOSCOPY;  Service: Endoscopy;  Laterality: N/A;   HERNIA REPAIR     reports surgery on 3 hernias, with 2 more present   [facility]AROSCOPIC [facility]STRIC [facility]EVE [facility]  [facility]   LEFT HEART CATHETERIZATION WITH CORONARY ANGIOGRAM N/A 08/26/2014   Procedure: LEFT HEART  CATHETERIZATION WITH CORONARY ANGIOGRAM;  Surgeon: Runell GessJonathan J Berry, MD;  [facility];  Service: Cardiovascular;  Laterality: N/A;   TUBAL LIGATION       OB History   No obstetric history on file.      Home Medications    Prior to Admission medications   Medication Sig Start Date End Date Taking? Authorizing Provider  Abatacept (ORENCIA IV) Inject 1,000 mg into the vein every 28 (twenty-eight) days.     [provider]  Adapalene 0.3 % gel Apply 1 application topically daily as needed (acne).  12/31/14   [provider]  ARIPiprazole (ABILIFY) 2 MG tablet Take 1 tablet (2 mg total) by mouth daily. 04/22/15   Benjaman Pottaylor, Gerald D, MD  Betamethasone Valerate 0.12 % foam Apply 1 application topically daily as needed. Itching 04/22/15   [provider]  buPROPion (WELLBUTRIN XL) 300 MG 24 hr tablet Take 1 tablet (300 mg total) by mouth daily. 12/28/14   Adonis BrookAgustin, Sheila, NP  cetirizine (ZYRTEC) 10 MG tablet cetirizine 10 mg tablet 12/14/17   [provider]  clindamycin-benzoyl peroxide (BENZACLIN) gel Apply 1 application topically daily as needed. Acne 02/13/15   [provider]  clobetasol cream (TEMOVATE) 0.05 % Apply 1 application topically daily as needed. Skin discoloration 04/22/15   [provider]  cyclobenzaprine (FLEXERIL) 10 MG tablet Take 1 tablet by mouth daily at bedtime as needed for muscle spasms. 06/02/18   Gearldine Bienenstockale, Taylor M, PA-C  diclofenac sodium (VOLTAREN) 1 % GEL APPLY 3 GRAMS TO 3 LARGE JOINTS UP TO THREE TIMES A DAY AS NEEDED 02/07/18   Pollyann Savoyeveshwar, Shaili, MD  diclofenac sodium (VOLTAREN) 1 % GEL Apply 2-4 grams to affected joints up to 4 times daily as needed. 10/27/18   Gearldine Bienenstockale, Taylor M, PA-C  doxepin (SINEQUAN) 10 MG capsule Take 10 mg by mouth at bedtime as needed (sleep).    [provider]  famciclovir (FAMVIR) 500 MG tablet Take 1500mg  on the onset of fever blister for 2 or 3 days as needed for fever blisters  01/21/15   [provider]  fluticasone (FLONASE) 50 MCG/ACT nasal spray Place 1 spray into both nostrils daily. 06/18/18   Caccavale, Sophia, PA-C  Folic Acid-Vit B6-Vit B12 (FABB) 2.2-25-1 MG TABS Take 1 tablet by mouth daily. 11/11/16   Panwala, Naitik, PA-C  Folic Acid-Vit B6-Vit B12 2.2-25-1 MG TABS TAKE 2 TABLETS BY MOUTH EVERY DAY 08/14/18   Pollyann Savoyeveshwar, Shaili, MD  gabapentin (NEURONTIN) 100 MG capsule TAKE 1 CAPSULE BY MOUTH EVERY DAY 08/24/18   Pollyann Savoyeveshwar, Shaili, MD  hydrOXYzine (VISTARIL) 25 MG capsule Take 25 mg by mouth every 8 (eight) hours as needed for anxiety.  12/30/14   [provider]  lidocaine (LIDODERM) 5 % Place 1 patch onto the skin daily. Remove & Discard patch within 12 hours or as directed by MD 10/27/18   Gearldine Bienenstockale, Taylor M, PA-C  metoprolol tartrate (LOPRESSOR) 25  MG tablet Take 1 tablet (25 mg total) by mouth 2 (two) times daily. Patient not taking: Reported on 08/05/2016 06/04/16 09/02/16  Manson Passey, PA  nitroGLYCERIN (NITROSTAT) 0.4 MG SL tablet PLACE 1 TABLET UNDER THE TONGUE EVERY 5 MINUTES AS NEEDED FOR CHEST PAIN. 09/06/16   Quintella Reichert, MD  RASUVO 25 MG/0.5ML SOAJ INJECT ONE PEN SUBCUTANEOUSLY ONCE EVERY WEEK. STORE AT ROOM TEMPERATURE BETWEEN 68 - 77 DEGREES F. 05/11/18   Pollyann Savoy, MD  rosuvastatin (CRESTOR) 20 MG tablet Take 20 mg by mouth. 09/21/16 06/01/18  [provider]  tretinoin (RETIN-A) 0.025 % cream Apply 1 application topically as needed.  02/11/15   [provider]  triamcinolone cream (KENALOG) 0.1 % Apply 1 application topically daily as needed. Itching 04/13/15   [provider]  Vitamin D, Ergocalciferol, (DRISDOL) 1.25 MG (50000 UT) CAPS capsule ergocalciferol (vitamin D2) 1,250 mcg (50,000 unit) capsule    [provider]  XERESE 5-1 % CREA Apply 1 application topically daily as needed (fever blisters).  12/30/14   [provider]    Family History Family History  Problem Relation  Age of Onset   Hyperlipidemia Mother    Depression Mother    Sarcoidosis Father    Lung disease Father        Pleural Mesothelioma   Cancer Father    Heart attack Paternal Grandmother    Hypertension Paternal Grandmother    Hypertension Maternal Grandmother    Diabetes Maternal Grandmother    Stroke Neg Hx    Colon cancer Neg Hx    Esophageal cancer Neg Hx    Rectal cancer Neg Hx    Stomach cancer Neg Hx     Social History Social History   Tobacco Use   Smoking status: Never Smoker   Smokeless tobacco: Never Used  Substance Use Topics   Alcohol use: No   Drug use: No     Allergies   Influenza vaccines and Isosorbide   Review of Systems Review of Systems  Constitutional: Negative for chills and fever.  HENT: Negative for congestion.        Right sided jaw pain  Eyes: Negative for pain and visual disturbance.  Respiratory: Negative for chest tightness and shortness of breath.   Cardiovascular: Negative for chest pain.  Gastrointestinal: Negative for abdominal pain, diarrhea and nausea.  Genitourinary: Negative for urgency.  Musculoskeletal: Positive for neck pain. Negative for back pain.  Skin: Negative for color change, rash and wound.  Allergic/Immunologic: Positive for immunocompromised state.  Neurological: Positive for headaches. Negative for dizziness, syncope, weakness and light-headedness.  Psychiatric/Behavioral: Negative for confusion.  All other systems reviewed and are negative.    Physical Exam Updated Vital Signs BP (!) 147/110 (BP Location: Right Arm)    Pulse (!) 101    Temp 98.5 F (36.9 C) (Oral)    Resp 18    Ht 5\' 2"  (1.575 m)    Wt 117.9 kg    SpO2 100%    BMI 47.55 kg/m   Physical Exam Vitals signs and nursing note reviewed.  Constitutional:      General: She is not in acute distress.    Appearance: She is well-developed. She is obese.  HENT:     Head: Normocephalic and atraumatic.     Comments: No lacerations.   No edema, battle signs, or raccoon eyes.  There is no obvious boggy area where patient reports her head feels soft.    Right Ear: Tympanic membrane,  ear canal and external ear normal.     Left Ear: Tympanic membrane, ear canal and external ear normal.     Mouth/Throat:     Mouth: Mucous membranes are moist.     Comments: Patient's teeth appear to align normally however she subjectively reports pain with palpation over the right-sided TMJ and maxilla.  No palpable crepitus or deformity.  No marked edema. Eyes:     Extraocular Movements: Extraocular movements intact.     Conjunctiva/sclera: Conjunctivae normal.     Pupils: Pupils are equal, round, and reactive to light.  Neck:     Comments: Slight midline tenderness to palpation. Cardiovascular:     Rate and Rhythm: Normal rate and regular rhythm.     Pulses: Normal pulses.     Heart sounds: Normal heart sounds. No murmur.  Pulmonary:     Effort: Pulmonary effort is normal. No respiratory distress.     Breath sounds: Normal breath sounds.  Abdominal:     General: There is no distension.     Palpations: Abdomen is soft.     Tenderness: There is no abdominal tenderness.  Musculoskeletal:     Comments: Right hand/thumb there is tenderness to palpation over the first metacarpal/proximal phalanges/metacarpal joint.  There is no crepitus or deformity.  No marked edema.  There is diffuse tenderness to palpation over the right lateral foot without crepitus, edema, or deformity palpated.  Skin:    General: Skin is warm and dry.  Neurological:     General: No focal deficit present.     Mental Status: She is alert and oriented to person, place, and time.  Psychiatric:        Mood and Affect: Mood normal.        Behavior: Behavior normal.      ED Treatments / Results  Labs (all labs ordered are listed, but only abnormal results are displayed) Labs Reviewed - No data to display  EKG None  Radiology Ct Head Wo Contrast  Result Date:  10/27/2018 CLINICAL DATA:  Recent fall with posterior head injury, initial encounter EXAM: CT HEAD WITHOUT CONTRAST CT MAXILLOFACIAL WITHOUT CONTRAST CT CERVICAL SPINE WITHOUT CONTRAST TECHNIQUE: Multidetector CT imaging of the head, cervical spine, and maxillofacial structures were performed using the standard protocol without intravenous contrast. Multiplanar CT image reconstructions of the cervical spine and maxillofacial structures were also generated. COMPARISON:  None. FINDINGS: CT HEAD FINDINGS Brain: No evidence of acute infarction, hemorrhage, hydrocephalus, extra-axial collection or mass lesion/mass effect. Vascular: No hyperdense vessel or unexpected calcification. Skull: Normal. Negative for fracture or focal lesion. Other: Mild subcutaneous edema is noted consistent with the recent occipital injury. No definitive hematoma is seen. CT MAXILLOFACIAL FINDINGS Osseous: No acute bony abnormality is noted. Orbits: Orbits and their contents are within normal limits. Sinuses: Paranasal sinuses are well aerated with the exception of minimal mucosal thickening inferiorly in the left maxillary antrum. No air-fluid levels are noted. Soft tissues: Negative. CT CERVICAL SPINE FINDINGS Alignment: Within normal limits. Skull base and vertebrae: 7 cervical segments are well visualized. Vertebral body height is well maintained. No acute fracture or acute facet abnormality is noted. Soft tissues and spinal canal: Surrounding soft tissues are within normal limits. Upper chest: Lung apices are unremarkable. Other: None IMPRESSION: CT of the head: No acute intracranial abnormality noted. CT of the maxillofacial bones: No acute bony abnormality is seen. Mild mucosal thickening within the left maxillary antrum inferiorly. CT of the cervical spine: No acute abnormality is noted. Electronically  Signed   By: Alcide CleverMark  Lukens M.D.   On: 10/27/2018 19:22   Ct Cervical Spine Wo Contrast  Result Date: 10/27/2018 CLINICAL DATA:   Recent fall with posterior head injury, initial encounter EXAM: CT HEAD WITHOUT CONTRAST CT MAXILLOFACIAL WITHOUT CONTRAST CT CERVICAL SPINE WITHOUT CONTRAST TECHNIQUE: Multidetector CT imaging of the head, cervical spine, and maxillofacial structures were performed using the standard protocol without intravenous contrast. Multiplanar CT image reconstructions of the cervical spine and maxillofacial structures were also generated. COMPARISON:  None. FINDINGS: CT HEAD FINDINGS Brain: No evidence of acute infarction, hemorrhage, hydrocephalus, extra-axial collection or mass lesion/mass effect. Vascular: No hyperdense vessel or unexpected calcification. Skull: Normal. Negative for fracture or focal lesion. Other: Mild subcutaneous edema is noted consistent with the recent occipital injury. No definitive hematoma is seen. CT MAXILLOFACIAL FINDINGS Osseous: No acute bony abnormality is noted. Orbits: Orbits and their contents are within normal limits. Sinuses: Paranasal sinuses are well aerated with the exception of minimal mucosal thickening inferiorly in the left maxillary antrum. No air-fluid levels are noted. Soft tissues: Negative. CT CERVICAL SPINE FINDINGS Alignment: Within normal limits. Skull base and vertebrae: 7 cervical segments are well visualized. Vertebral body height is well maintained. No acute fracture or acute facet abnormality is noted. Soft tissues and spinal canal: Surrounding soft tissues are within normal limits. Upper chest: Lung apices are unremarkable. Other: None IMPRESSION: CT of the head: No acute intracranial abnormality noted. CT of the maxillofacial bones: No acute bony abnormality is seen. Mild mucosal thickening within the left maxillary antrum inferiorly. CT of the cervical spine: No acute abnormality is noted. Electronically Signed   By: Alcide CleverMark  Lukens M.D.   On: 10/27/2018 19:22   Dg Hand Complete Right  Result Date: 10/27/2018 CLINICAL DATA:  Pain status post fall EXAM: RIGHT HAND  - COMPLETE 3+ VIEW COMPARISON:  None. FINDINGS: There is no evidence of fracture or dislocation. There is no evidence of arthropathy or other focal bone abnormality. Soft tissues are unremarkable. IMPRESSION: Negative. Electronically Signed   By: Katherine Mantlehristopher  Green M.D.   On: 10/27/2018 19:19   Dg Foot Complete Right  Result Date: 10/27/2018 CLINICAL DATA:  Pain EXAM: RIGHT FOOT COMPLETE - 3+ VIEW COMPARISON:  Oct 18, 2011 FINDINGS: Frontal, oblique, and lateral views were obtained. There is no acute fracture or dislocation. Small calcifications lateral to the proximal fifth metatarsal may represent residua of old trauma. There is no appreciable joint space narrowing or erosion. There are small posterior and inferior calcaneal spurs. There is an os peroneum, an anatomic variant. IMPRESSION: No acute fracture or dislocation. Question residua of old trauma lateral to the base of the fifth metatarsal. No appreciable joint space narrowing or erosion. There are small calcaneal spurs. Electronically Signed   By: Bretta BangWilliam  Woodruff III M.D.   On: 10/27/2018 19:21   Ct Maxillofacial Wo Cm  Result Date: 10/27/2018 CLINICAL DATA:  Recent fall with posterior head injury, initial encounter EXAM: CT HEAD WITHOUT CONTRAST CT MAXILLOFACIAL WITHOUT CONTRAST CT CERVICAL SPINE WITHOUT CONTRAST TECHNIQUE: Multidetector CT imaging of the head, cervical spine, and maxillofacial structures were performed using the standard protocol without intravenous contrast. Multiplanar CT image reconstructions of the cervical spine and maxillofacial structures were also generated. COMPARISON:  None. FINDINGS: CT HEAD FINDINGS Brain: No evidence of acute infarction, hemorrhage, hydrocephalus, extra-axial collection or mass lesion/mass effect. Vascular: No hyperdense vessel or unexpected calcification. Skull: Normal. Negative for fracture or focal lesion. Other: Mild subcutaneous edema is noted consistent with the  recent occipital injury. No  definitive hematoma is seen. CT MAXILLOFACIAL FINDINGS Osseous: No acute bony abnormality is noted. Orbits: Orbits and their contents are within normal limits. Sinuses: Paranasal sinuses are well aerated with the exception of minimal mucosal thickening inferiorly in the left maxillary antrum. No air-fluid levels are noted. Soft tissues: Negative. CT CERVICAL SPINE FINDINGS Alignment: Within normal limits. Skull base and vertebrae: 7 cervical segments are well visualized. Vertebral body height is well maintained. No acute fracture or acute facet abnormality is noted. Soft tissues and spinal canal: Surrounding soft tissues are within normal limits. Upper chest: Lung apices are unremarkable. Other: None IMPRESSION: CT of the head: No acute intracranial abnormality noted. CT of the maxillofacial bones: No acute bony abnormality is seen. Mild mucosal thickening within the left maxillary antrum inferiorly. CT of the cervical spine: No acute abnormality is noted. Electronically Signed   By: Alcide Clever M.D.   On: 10/27/2018 19:22    Procedures Procedures (including critical care time)  Medications Ordered in ED Medications  acetaminophen (TYLENOL) tablet 1,000 mg (1,000 mg Oral Given 10/27/18 1959)     Initial Impression / Assessment and Plan / ED Course  I have reviewed the triage vital signs and the nursing notes.  Pertinent labs & imaging results that were available during my care of the patient were reviewed by me and considered in my medical decision making (see chart for details).       Patient presents today for evaluation after a mechanical fall.  On evaluation she complained of pain in the back of her head, the right side of her face/jaw, right hand, and right foot.  CT scan of head, maxillofacial, and C-spine was obtained without evidence of intracranial hemorrhage, fracture, or other acute abnormalities.  Plain films were obtained of the right hand and foot without evidence of fracture or  other acute abnormality.  Patient requested pain medicine and was treated with Tylenol while in the emergency room.  Recommended conservative care including rice, OTC pain medicine.  I suspect that her symptoms are from a contusion and general soreness after her mechanical fall.  Return precautions were discussed with patient who states their understanding.  At the time of discharge patient denied any unaddressed complaints or concerns.  Patient is agreeable for discharge home.   Final Clinical Impressions(s) / ED Diagnoses   Final diagnoses:  Injury of head, initial encounter  Fall in home, initial encounter  Pain of right thumb  Right foot pain    ED Discharge Orders    None       Norman Clay 10/27/18 [id number]    Tegeler, Canary Brim, MD 10/28/18 0010

## 2018-10-27 NOTE — ED Triage Notes (Signed)
Pt c/o slip and fall x 1 hr ago c/o right jaw pain and right thumb pain

## 2018-10-27 NOTE — ED Notes (Signed)
Pt called out requesting pain medicaiton. Secure chat message sent to EDP requesting the  same

## 2018-10-27 NOTE — Discharge Instructions (Signed)
Please take Ibuprofen (Advil, motrin) and Tylenol (acetaminophen) to relieve your pain.  You may take up to 600 MG (3 pills) of normal strength ibuprofen every 8 hours as needed.  In between doses of ibuprofen you make take tylenol, up to 1,000 mg (two extra strength pills).  Do not take more than 3,000 mg tylenol in a 24 hour period.  Please check all medication labels as many medications such as pain and cold medications may contain tylenol.  Do not drink alcohol while taking these medications.  Do not take other NSAID'S while taking ibuprofen (such as aleve or naproxen).  Please take ibuprofen with food to decrease stomach upset. ° °

## 2018-10-27 NOTE — ED Notes (Signed)
ED Provider at bedside. 

## 2018-11-03 ENCOUNTER — Other Ambulatory Visit: Payer: Self-pay | Admitting: Rheumatology

## 2018-11-03 NOTE — Telephone Encounter (Signed)
Last Visit: 10/27/18 Next Visit: 02/07/19 Labs: 10/19/18 cbc WNL CMP stable  Okay to refill per Dr. Corliss Skains

## 2018-11-13 ENCOUNTER — Other Ambulatory Visit: Payer: Self-pay | Admitting: *Deleted

## 2018-11-13 NOTE — Progress Notes (Signed)
Infusion orders are current for patient CBC CMP Tylenol Benadryl appointments are up to date and follow up appointment  is scheduled TB gold not due yet.  

## 2018-11-16 ENCOUNTER — Ambulatory Visit (HOSPITAL_COMMUNITY)
Admission: RE | Admit: 2018-11-16 | Discharge: 2018-11-16 | Disposition: A | Discharge status: Home / Self Care | Payer: Self-pay | Source: Ambulatory Visit | Attending: Rheumatology | Admitting: Rheumatology

## 2018-11-16 ENCOUNTER — Other Ambulatory Visit: Payer: Self-pay

## 2018-11-16 DIAGNOSIS — M0579 Rheumatoid arthritis with rheumatoid factor of multiple sites without organ or systems involvement: Secondary | ICD-10-CM | POA: Insufficient documentation

## 2018-11-16 MED ORDER — DIPHENHYDRAMINE HCL 25 MG PO CAPS: 25.0000 mg | ORAL_CAPSULE | ORAL | Status: DC

## 2018-11-16 MED ORDER — SODIUM CHLORIDE 0.9 % IV SOLN
1000.0000 mg | INTRAVENOUS | Status: AC
  Administered 2018-11-16: 1000 mg via INTRAVENOUS
  Filled 2018-11-16: qty 40

## 2018-11-16 MED ORDER — ACETAMINOPHEN 325 MG PO TABS
ORAL_TABLET | ORAL | Status: AC
  Administered 2018-11-16: 650 mg
  Filled 2018-11-16: qty 2

## 2018-11-16 MED ORDER — ACETAMINOPHEN 325 MG PO TABS: 650.0000 mg | ORAL_TABLET | ORAL | Status: DC

## 2018-11-16 MED ORDER — DIPHENHYDRAMINE HCL 25 MG PO CAPS
ORAL_CAPSULE | ORAL | Status: AC
  Administered 2018-11-16: 25 mg
  Filled 2018-11-16: qty 1

## 2018-11-19 ENCOUNTER — Other Ambulatory Visit: Payer: Self-pay | Admitting: Rheumatology

## 2018-11-20 NOTE — Telephone Encounter (Signed)
Last Visit: 10/27/2018 telemedicine  Next Visit: 02/07/2019  Okay to refill per Dr. Deveshwar.  

## 2018-12-01 ENCOUNTER — Other Ambulatory Visit: Payer: Self-pay | Admitting: *Deleted

## 2018-12-01 NOTE — Progress Notes (Signed)
Infusion orders are current for patient CBC CMP Tylenol Benadryl appointments are up to date and follow up appointment  is scheduled TB gold not due yet.  

## 2018-12-14 ENCOUNTER — Ambulatory Visit (HOSPITAL_COMMUNITY)
Admission: RE | Admit: 2018-12-14 | Discharge: 2018-12-14 | Disposition: A | Discharge status: Home / Self Care | Payer: Self-pay | Source: Ambulatory Visit | Attending: Rheumatology | Admitting: Rheumatology

## 2018-12-14 ENCOUNTER — Other Ambulatory Visit: Payer: Self-pay

## 2018-12-14 DIAGNOSIS — M0579 Rheumatoid arthritis with rheumatoid factor of multiple sites without organ or systems involvement: Secondary | ICD-10-CM | POA: Insufficient documentation

## 2018-12-14 LAB — COMPREHENSIVE METABOLIC PANEL
ALT: 18 U/L (ref 0–44)
AST: 21 U/L (ref 15–41)
Albumin: 3.3 g/dL — ABNORMAL LOW (ref 3.5–5.0)
Alkaline Phosphatase: 70 U/L (ref 38–126)
Anion gap: 10 (ref 5–15)
BUN: 8 mg/dL (ref 6–20)
CO2: 25 mmol/L (ref 22–32)
Calcium: 9 mg/dL (ref 8.9–10.3)
Chloride: 106 mmol/L (ref 98–111)
Creatinine, Ser: 0.85 mg/dL (ref 0.44–1.00)
GFR calc Af Amer: >60 mL/min (ref >60)
GFR calc non Af Amer: >60 mL/min (ref >60)
Glucose, Bld: 109 mg/dL — ABNORMAL HIGH (ref 70–99)
Potassium: 3.3 mmol/L — ABNORMAL LOW (ref 3.5–5.1)
Sodium: 141 mmol/L (ref 135–145)
Total Bilirubin: 0.4 mg/dL (ref 0.3–1.2)
Total Protein: 6.4 g/dL — ABNORMAL LOW (ref 6.5–8.1)

## 2018-12-14 LAB — CBC
HCT: 44.3 % (ref 36.0–46.0)
Hemoglobin: 13.6 g/dL (ref 12.0–15.0)
MCH: 26.5 pg (ref 26.0–34.0)
MCHC: 30.7 g/dL (ref 30.0–36.0)
MCV: 86.2 fL (ref 80.0–100.0)
Platelets: 259 10*3/uL (ref 150–400)
RBC: 5.14 MIL/uL — ABNORMAL HIGH (ref 3.87–5.11)
RDW: 12.3 % (ref 11.5–15.5)
WBC: 8.2 10*3/uL (ref 4.0–10.5)
nRBC: 0 % (ref 0.0–0.2)

## 2018-12-14 MED ORDER — DIPHENHYDRAMINE HCL 25 MG PO CAPS
25.0000 mg | ORAL_CAPSULE | ORAL | Status: DC
  Administered 2018-12-14: 25 mg via ORAL

## 2018-12-14 MED ORDER — SODIUM CHLORIDE 0.9 % IV SOLN
1000.0000 mg | INTRAVENOUS | Status: DC
  Administered 2018-12-14: 1000 mg via INTRAVENOUS
  Filled 2018-12-14: qty 40

## 2018-12-14 MED ORDER — DIPHENHYDRAMINE HCL 25 MG PO CAPS
ORAL_CAPSULE | ORAL | Status: AC
  Filled 2018-12-14: qty 1

## 2018-12-14 MED ORDER — ACETAMINOPHEN 325 MG PO TABS
ORAL_TABLET | ORAL | Status: AC
  Filled 2018-12-14: qty 2

## 2018-12-14 MED ORDER — ACETAMINOPHEN 325 MG PO TABS
650.0000 mg | ORAL_TABLET | ORAL | Status: DC
  Administered 2018-12-14: 650 mg via ORAL

## 2018-12-14 NOTE — Progress Notes (Signed)
K is low.  Please notify patient and also forward labs to her PCP.

## 2019-01-05 ENCOUNTER — Other Ambulatory Visit: Payer: Self-pay | Admitting: *Deleted

## 2019-01-05 NOTE — Progress Notes (Signed)
Infusion orders are current for patient CBC CMP Tylenol Benadryl appointments are up to date and follow up appointment  is scheduled TB gold not due yet.  

## 2019-01-11 ENCOUNTER — Other Ambulatory Visit: Payer: Self-pay

## 2019-01-11 ENCOUNTER — Ambulatory Visit (HOSPITAL_COMMUNITY)
Admission: RE | Admit: 2019-01-11 | Discharge: 2019-01-11 | Disposition: A | Discharge status: Home / Self Care | Payer: Self-pay | Source: Ambulatory Visit | Attending: Rheumatology | Admitting: Rheumatology

## 2019-01-11 DIAGNOSIS — M0579 Rheumatoid arthritis with rheumatoid factor of multiple sites without organ or systems involvement: Secondary | ICD-10-CM | POA: Insufficient documentation

## 2019-01-11 MED ORDER — DIPHENHYDRAMINE HCL 25 MG PO CAPS
25.0000 mg | ORAL_CAPSULE | ORAL | Status: DC
  Administered 2019-01-11: 25 mg via ORAL

## 2019-01-11 MED ORDER — DIPHENHYDRAMINE HCL 25 MG PO CAPS
ORAL_CAPSULE | ORAL | Status: AC
  Filled 2019-01-11: qty 1

## 2019-01-11 MED ORDER — ACETAMINOPHEN 325 MG PO TABS
ORAL_TABLET | ORAL | Status: AC
  Filled 2019-01-11: qty 2

## 2019-01-11 MED ORDER — ACETAMINOPHEN 325 MG PO TABS
650.0000 mg | ORAL_TABLET | ORAL | Status: DC
  Administered 2019-01-11: 650 mg via ORAL

## 2019-01-11 MED ORDER — SODIUM CHLORIDE 0.9 % IV SOLN
1000.0000 mg | INTRAVENOUS | Status: DC
  Administered 2019-01-11: 1000 mg via INTRAVENOUS
  Filled 2019-01-11: qty 40

## 2019-01-12 ENCOUNTER — Encounter (HOSPITAL_COMMUNITY): Payer: Self-pay

## 2019-01-12 ENCOUNTER — Emergency Department (HOSPITAL_COMMUNITY): Payer: Self-pay

## 2019-01-12 ENCOUNTER — Other Ambulatory Visit: Payer: Self-pay

## 2019-01-12 ENCOUNTER — Emergency Department (HOSPITAL_COMMUNITY)
Admission: EM | Admit: 2019-01-12 | Discharge: 2019-01-12 | Disposition: A | Discharge status: Home / Self Care | Payer: Self-pay | Attending: Emergency Medicine | Admitting: Emergency Medicine

## 2019-01-12 DIAGNOSIS — Z20828 Contact with and (suspected) exposure to other viral communicable diseases: Secondary | ICD-10-CM | POA: Insufficient documentation

## 2019-01-12 DIAGNOSIS — I5030 Unspecified diastolic (congestive) heart failure: Secondary | ICD-10-CM | POA: Diagnosis not present

## 2019-01-12 DIAGNOSIS — J029 Acute pharyngitis, unspecified: Secondary | ICD-10-CM | POA: Diagnosis present

## 2019-01-12 DIAGNOSIS — I2510 Atherosclerotic heart disease of native coronary artery without angina pectoris: Secondary | ICD-10-CM | POA: Diagnosis not present

## 2019-01-12 DIAGNOSIS — J069 Acute upper respiratory infection, unspecified: Secondary | ICD-10-CM | POA: Insufficient documentation

## 2019-01-12 DIAGNOSIS — I110 Hypertensive heart disease with heart failure: Secondary | ICD-10-CM | POA: Diagnosis not present

## 2019-01-12 DIAGNOSIS — Z79899 Other long term (current) drug therapy: Secondary | ICD-10-CM | POA: Insufficient documentation

## 2019-01-12 LAB — SARS CORONAVIRUS 2 BY RT PCR (HOSPITAL ORDER, PERFORMED IN ~~LOC~~ HOSPITAL LAB): SARS Coronavirus 2: NEGATIVE

## 2019-01-12 MED ORDER — ONDANSETRON 4 MG PO TBDP
4.0000 mg | ORAL_TABLET | Freq: Once | ORAL | Status: AC
  Administered 2019-01-12: 4 mg via ORAL
  Filled 2019-01-12: qty 1

## 2019-01-12 MED ORDER — ONDANSETRON 4 MG PO TBDP: 4.0000 mg | ORAL_TABLET | Freq: Three times a day (TID) | ORAL | 0 refills | Status: DC | PRN

## 2019-01-12 NOTE — ED Provider Notes (Signed)
[facility]-EMERGENCY DEPT Provider Note   CSN: [id number] Arrival date & time: 01/12/19  1639    History   Chief Complaint Chief Complaint  Patient presents with  . Exposed to Covid  . Sore Throat    HPI [name] [name] is a 53 y.o. female with past medical history of rheumatoid arthritis, fibromyalgia, presenting to the emergency department with 2 days of body aches, sore throat, headache, and loss of taste.  She states she just found out today just prior to arrival to the ED that she had contact with someone that tested positive for COVID virus on Friday last week.  She states she would her PCP who recommended she come to the ED for testing.  No other associated symptoms. Of note, she receives abatacept injections every 28 days for her rheumatoid arthritis which causes her immune system to be weakened.     The history is provided by the patient.    Past Medical History:  Diagnosis Date  . Anxiety   . Coronary artery calcification seen on CAT scan    minimal CAD with 30% prox and mild LAD  . Depression   . Diastolic dysfunction   . Dyslipidemia 01/30/2015  . Esophageal ring   . Fibromyalgia   . Hiatal hernia   . HSV-1 infection   . Morbid obesity (HCC)   . Rheumatoid arthritis(714.0)    M05.79  . Rheumatoid arthritis, seropositive, 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HCC)    Treated with Orencia, TB neg 09/12/2015    Patient Active Problem List   Diagnosis Date Noted  . High risk medication use 08/03/2016  . Fibromyalgia syndrome 08/03/2016  . Abnormal cardiac CT angiography   . Sacrococcygeal pain 02/20/2016  . Rheumatoid arthritis involving 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HCC) 01/24/2016  . 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 Dyslipidemia 01/30/2015  . 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 Essential hypertension 08/25/2014  . Morbid obesity (HCC) 08/25/2014  . CAD (coronary artery disease), native coronary artery  08/24/2014  . Numbness and tingling in left arm   . Arm numbness left 08/22/2014  . Chest pain 08/22/2014  . Gastroesophageal reflux disease without esophagitis 07/23/2014  . Coronary artery calcification seen on CAT scan 02/19/2014  . Hip pain 10/23/2013  . Solitary pulmonary nodule 08/28/2013  . Chest pain, atypical 07/18/2013  . Heart palpitations 06/27/2013  . Cough 06/04/2013  . Diastolic dysfunction 05/18/2013  . SOB (shortness of breath) 05/09/2013  . Other malaise and fatigue 01/21/2013  . Stress and adjustment reaction 12/09/2012  . Right sided abdominal pain 11/29/2012  . 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/21/2012  . Morbid obesity with BMI of 50.0-59.9, adult (HCC) 11/21/2012  . Arthritis 07/13/2012  . Depression 07/13/2012  . Routine health maintenance 05/07/2012    Past Surgical History:  Procedure Laterality Date  . CARDIAC CATHETERIZATION N/A 06/11/2016   Procedure: Left Heart Cath and Coronary Angiography;  Surgeon: Corky CraftsJayadeep S Varanasi, MD;  [facility];  Service: Cardiovascular;  Laterality: N/A;  . CESAREAN SECTION    . COLONOSCOPY  07/27/2017  . ESOPHAGEAL MANOMETRY N/A 10/28/2014   Procedure: ESOPHAGEAL MANOMETRY (EM);  Surgeon: Hilarie FredricksonJohn N Perry, MD;  Location: [facility] ENDOSCOPY;  Service: Endoscopy;  Laterality: N/A;  . HERNIA REPAIR     reports surgery on 3 hernias, with 2 more present  . [facility]  . LEFT HEART CATHETERIZATION WITH CORONARY ANGIOGRAM N/A 08/26/2014  Procedure: LEFT HEART CATHETERIZATION WITH CORONARY ANGIOGRAM;  Surgeon: Runell Gess, MD;  Location: [facility] CATH LAB;  Service: Cardiovascular;  Laterality: N/A;  . TUBAL LIGATION       OB History   No obstetric history on file.      Home Medications    Prior to Admission medications   Medication Sig Start Date End Date Taking? Authorizing Provider  Abatacept (ORENCIA IV) Inject 1,000 mg into the vein every 28  (twenty-eight) days.     [provider]  Adapalene 0.3 % gel Apply 1 application topically daily as needed (acne).  12/31/14   [provider]  ARIPiprazole (ABILIFY) 2 MG tablet Take 1 tablet (2 mg total) by mouth daily. 04/22/15   Benjaman Pott, MD  Betamethasone Valerate 0.12 % foam Apply 1 application topically daily as needed. Itching 04/22/15   [provider]  buPROPion (WELLBUTRIN XL) 300 MG 24 hr tablet Take 1 tablet (300 mg total) by mouth daily. 12/28/14   Adonis Brook, NP  cetirizine (ZYRTEC) 10 MG tablet cetirizine 10 mg tablet 12/14/17   [provider]  clindamycin-benzoyl peroxide (BENZACLIN) gel Apply 1 application topically daily as needed. Acne 02/13/15   [provider]  clobetasol cream (TEMOVATE) 0.05 % Apply 1 application topically daily as needed. Skin discoloration 04/22/15   [provider]  cyclobenzaprine (FLEXERIL) 10 MG tablet Take 1 tablet by mouth daily at bedtime as needed for muscle spasms. 06/02/18   Gearldine Bienenstock, PA-C  diclofenac sodium (VOLTAREN) 1 % GEL APPLY 3 GRAMS TO 3 LARGE JOINTS UP TO THREE TIMES A DAY AS NEEDED 02/07/18   Pollyann Savoy, MD  diclofenac sodium (VOLTAREN) 1 % GEL Apply 2-4 grams to affected joints up to 4 times daily as needed. 10/27/18   Gearldine Bienenstock, PA-C  doxepin (SINEQUAN) 10 MG capsule Take 10 mg by mouth at bedtime as needed (sleep).    [provider]  famciclovir (FAMVIR) 500 MG tablet Take 1500mg  on the onset of fever blister for 2 or 3 days as needed for fever blisters 01/21/15   [provider]  fluticasone (FLONASE) 50 MCG/ACT nasal spray Place 1 spray into both nostrils daily. 06/18/18   Caccavale, Sophia, PA-C  Folic Acid-Vit B6-Vit B12 (FABB) 2.2-25-1 MG TABS Take 1 tablet by mouth daily. 11/11/16   Panwala, Annamarie Dawley, PA-C  Folic Acid-Vit B6-Vit B12 2.2-25-1 MG TABS TAKE 2 TABLETS BY MOUTH EVERY DAY 08/14/18   Pollyann Savoy, MD  gabapentin (NEURONTIN)  100 MG capsule TAKE 1 CAPSULE BY MOUTH EVERY DAY 11/20/18   Pollyann Savoy, MD  hydrOXYzine (VISTARIL) 25 MG capsule Take 25 mg by mouth every 8 (eight) hours as needed for anxiety.  12/30/14   [provider]  lidocaine (LIDODERM) 5 % Place 1 patch onto the skin daily. Remove & Discard patch within 12 hours or as directed by MD 10/27/18   Gearldine Bienenstock, PA-C  metoprolol tartrate (LOPRESSOR) 25 MG tablet Take 1 tablet (25 mg total) by mouth 2 (two) times daily. Patient not taking: Reported on 08/05/2016 06/04/16 09/02/16  Manson Passey, PA  nitroGLYCERIN (NITROSTAT) 0.4 MG SL tablet PLACE 1 TABLET UNDER THE TONGUE EVERY 5 MINUTES AS NEEDED FOR CHEST PAIN. 09/06/16   Quintella Reichert, MD  ondansetron (ZOFRAN ODT) 4 MG disintegrating tablet Take 1 tablet (4 mg total) by mouth every 8 (eight) hours as needed for nausea or vomiting. 01/12/19   Robinson, [place] N, PA-C  RASUVO 25 MG/0.5ML  SOAJ INJECT ONE PEN SUBCUTANEOUSLY ONCE EVERY WEEK. STORE AT ROOM TEMPERATURE BETWEEN 68 - 77 DEGREES F. 11/03/18   Pollyann Savoy, MD  rosuvastatin (CRESTOR) 20 MG tablet Take 20 mg by mouth. 09/21/16 06/01/18  [provider]  tretinoin (RETIN-A) 0.025 % cream Apply 1 application topically as needed.  02/11/15   [provider]  triamcinolone cream (KENALOG) 0.1 % Apply 1 application topically daily as needed. Itching 04/13/15   [provider]  Vitamin D, Ergocalciferol, (DRISDOL) 1.25 MG (50000 UT) CAPS capsule ergocalciferol (vitamin D2) 1,250 mcg (50,000 unit) capsule    [provider]  XERESE 5-1 % CREA Apply 1 application topically daily as needed (fever blisters).  12/30/14   [provider]    Family History Family History  Problem Relation Age of Onset  . Hyperlipidemia Mother   . Depression Mother   . Sarcoidosis Father   . Lung disease Father        Pleural Mesothelioma  . Cancer Father   . Heart attack Paternal Grandmother   . Hypertension Paternal  Grandmother   . Hypertension Maternal Grandmother   . Diabetes Maternal Grandmother   . Stroke Neg Hx   . Colon cancer Neg Hx   . Esophageal cancer Neg Hx   . Rectal cancer Neg Hx   . Stomach cancer Neg Hx     Social History Social History   Tobacco Use  . Smoking status: Never Smoker  . Smokeless tobacco: Never Used  Substance Use Topics  . Alcohol use: No  . Drug use: No     Allergies   Influenza vaccines and Isosorbide   Review of Systems Review of Systems  Constitutional: Negative for fever.  HENT: Positive for sore throat.        Loss of taste  Eyes: Negative for visual disturbance.  Respiratory: Negative for shortness of breath.   Gastrointestinal: Positive for nausea. Negative for abdominal pain and vomiting.  Neurological: Positive for headaches.  All other systems reviewed and are negative.    Physical Exam Updated Vital Signs BP 132/81 (BP Location: Right Arm)   Pulse 89   Temp 98.4 F (36.9 C) (Oral)   Resp 18   SpO2 98%   Physical Exam Vitals signs and nursing note reviewed.  Constitutional:      Appearance: She is well-developed.  HENT:     Head: Normocephalic and atraumatic.  Eyes:     Conjunctiva/sclera: Conjunctivae normal.  Neck:     Musculoskeletal: Normal range of motion.  Cardiovascular:     Rate and Rhythm: Normal rate.  Pulmonary:     Effort: Pulmonary effort is normal. No respiratory distress.     Comments: Chest expansion is symmetric.  O2 saturation is 98% on room air.  Normal work of breathing.  Speaking in full sentences without difficulty. Neurological:     Mental Status: She is alert and oriented to person, place, and time.     Comments: EOMs grossly intact.  Psychiatric:        Behavior: Behavior normal.      ED Treatments / Results  Labs (all labs ordered are listed, but only abnormal results are displayed) Labs Reviewed  SARS CORONAVIRUS 2 (HOSPITAL ORDER, PERFORMED IN [facility])    EKG None   Radiology Dg Chest Portable 1 View  Result Date: 01/12/2019 CLINICAL DATA:  Shortness of breath and possible covert exposure. EXAM: PORTABLE CHEST 1 VIEW COMPARISON:  Chest radiograph 05/11/2018 FINDINGS: Heart size  within normal limits. No focal consolidation within the lungs. No evidence of pleural effusion or pneumothorax. No acute bony abnormality. Redemonstrated left subacromial spurring. IMPRESSION: Clear lungs. Electronically Signed   By: Kellie Simmering   On: 01/12/2019 17:27    Procedures Procedures (including critical care time)  Medications Ordered in ED Medications  ondansetron (ZOFRAN-ODT) disintegrating tablet 4 mg (4 mg Oral Given 01/12/19 2020)     Initial Impression / Assessment and Plan / ED Course  I have reviewed the triage vital signs and the nursing notes.  Pertinent labs & imaging results that were available during my care of the patient were reviewed by me and considered in my medical decision making (see chart for details).        [name] [name] was evaluated in Emergency Department on 01/12/2019 for the symptoms described in the history of present illness. She was evaluated in the context of the global COVID-19 pandemic, which necessitated consideration that the patient might be at risk for infection with the SARS-CoV-2 virus that causes COVID-19. Institutional protocols and algorithms that pertain to the evaluation of patients at risk for COVID-19 are in a state of rapid change based on information released by regulatory bodies including the CDC and federal and state organizations. These policies and algorithms were followed during the patient's care in the ED.  Patient presenting with positive COVID contact last week, began having symptoms 2 days ago consisting of sore throat, body aches, headache, nausea, and loss of taste.  She is in no distress with normal vital signs here.  Of note she is receiving injections once a month for her rheumatoid arthritis.  This  was discussed with Dr. Roderic Palau.  At this time there is no indication for admission.  Rapid COVID test obtained and is negative.  Likely viral URI.  Patient has close outpatient follow-up and agrees with plan for discharge.  Strict return precautions.  Discussed results, findings, treatment and follow up. Patient advised of return precautions. Patient verbalized understanding and agreed with plan.   Final Clinical Impressions(s) / ED Diagnoses   Final diagnoses:  Viral URI    ED Discharge Orders         Ordered    ondansetron (ZOFRAN ODT) 4 MG disintegrating tablet  Every 8 hours PRN     01/12/19 2140           Robinson, [place] N, PA-C 01/12/19 2142    Milton Ferguson, MD 01/12/19 2222

## 2019-01-12 NOTE — ED Triage Notes (Signed)
Patient c/o body aches, sore throat, and headache. Patient reports shob with exertion.    Patient reports being exposed to a confirmed positive patient on Friday.     A/ox4 Ambulatory in triage.   Patient would like a covid test.

## 2019-01-12 NOTE — Discharge Instructions (Addendum)
Your COVID test is negative today. Please continue treating your symptoms with over-the-counter medications as needed.  It is recommended that you stay home until your symptoms improve. Follow up closely with your primary care provider.

## 2019-01-24 NOTE — Progress Notes (Deleted)
Office Visit Note  Patient: [name] [name]             Date of Birth: 16-Sep-1965           MRN: [id number]             PCP: Jonathon Resides, MD Referring: Jonathon Resides, MD Visit Date: 02/07/2019 Occupation: @GUAROCC @  Subjective:  No chief complaint on file.   History of Present Illness: [name] [name] is a 53 y.o. female ***   Activities of Daily Living:  Patient reports morning stiffness for *** {minute/hour:19697}.   Patient {ACTIONS;DENIES/REPORTS:21021675::"Denies"} nocturnal pain.  Difficulty dressing/grooming: {ACTIONS;DENIES/REPORTS:21021675::"Denies"} Difficulty climbing stairs: {ACTIONS;DENIES/REPORTS:21021675::"Denies"} Difficulty getting out of chair: {ACTIONS;DENIES/REPORTS:21021675::"Denies"} Difficulty using hands for taps, buttons, cutlery, and/or writing: {ACTIONS;DENIES/REPORTS:21021675::"Denies"}  No Rheumatology ROS completed.   PMFS History:  Patient Active Problem List   Diagnosis Date Noted  . High risk medication use 08/03/2016  . Fibromyalgia syndrome 08/03/2016  . Abnormal cardiac CT angiography   . Sacrococcygeal pain 02/20/2016  . Rheumatoid arthritis involving 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[place]) 01/24/2016  . 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 Dyslipidemia 01/30/2015  . MDD (major depressive disorder), recurrent severe, without psychosis ([place]) 12/22/2014  . Essential hypertension 08/25/2014  . Morbid obesity ([place]) 08/25/2014  . CAD (coronary artery disease), native coronary artery 08/24/2014  . Numbness and tingling in left arm   . Arm numbness left 08/22/2014  . Chest pain 08/22/2014  . Gastroesophageal reflux disease without esophagitis 07/23/2014  . Coronary artery calcification seen on CAT scan 02/19/2014  . Hip pain 10/23/2013  . Solitary pulmonary nodule 08/28/2013  . Chest pain, atypical 07/18/2013  . Heart palpitations 06/27/2013  . Cough 06/04/2013  . Diastolic dysfunction 18/29/9371  . SOB (shortness of breath) 05/09/2013   . Other malaise and fatigue 01/21/2013  . Stress and adjustment reaction 12/09/2012  . Right sided abdominal pain 11/29/2012  . 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/21/2012  . Morbid obesity with BMI of 50.0-59.9, adult ([place]) 11/21/2012  . Arthritis 07/13/2012  . Depression 07/13/2012  . Routine health maintenance 05/07/2012    Past Medical History:  Diagnosis Date  . Anxiety   . Coronary artery calcification seen on CAT scan    minimal CAD with 30% prox and mild LAD  . Depression   . Diastolic dysfunction   . Dyslipidemia 01/30/2015  . Esophageal ring   . Fibromyalgia   . Hiatal hernia   . HSV-1 infection   . Morbid obesity ([place])   . Rheumatoid arthritis(714.0)    M05.79  . Rheumatoid arthritis, seropositive, 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[place])    Treated with Orencia, TB neg 09/12/2015    Family History  Problem Relation Age of Onset  . Hyperlipidemia Mother   . Depression Mother   . Sarcoidosis Father   . Lung disease Father        Pleural Mesothelioma  . Cancer Father   . Heart attack Paternal Grandmother   . Hypertension Paternal Grandmother   . Hypertension Maternal Grandmother   . Diabetes Maternal Grandmother   . Stroke Neg Hx   . Colon cancer Neg Hx   . Esophageal cancer Neg Hx   . Rectal cancer Neg Hx   . Stomach cancer Neg Hx    Past Surgical History:  Procedure Laterality Date  . CARDIAC CATHETERIZATION N/A 06/11/2016   Procedure: Left Heart Cath and Coronary Angiography;  Surgeon: Jettie Booze, MD;  Location: [facility] CV LAB;  Service: Cardiovascular;  Laterality: N/A;  .  CESAREAN SECTION    . COLONOSCOPY  07/27/2017  . ESOPHAGEAL MANOMETRY N/A 10/28/2014   Procedure: ESOPHAGEAL MANOMETRY (EM);  Surgeon: Hilarie Fredrickson, MD;  Location: [facility] ENDOSCOPY;  Service: Endoscopy;  Laterality: N/A;  . HERNIA REPAIR     reports surgery on 3 hernias, with 2 more present  . LAPAROSCOPIC GASTRIC SLEEVE RESECTION  11/21/12   [facility]  . LEFT HEART  CATHETERIZATION WITH CORONARY ANGIOGRAM N/A 08/26/2014   Procedure: LEFT HEART CATHETERIZATION WITH CORONARY ANGIOGRAM;  Surgeon: Runell Gess, MD;  Location: [facility] CATH LAB;  Service: Cardiovascular;  Laterality: N/A;  . TUBAL LIGATION     Social History   Social History Narrative   Marital Status: Married Probation officer)    Children:  Son [name]) Daughter [name])    Pets: None    Living Situation: Lives with husband and children    Occupation: Occupational psychologist Administrator)     Education: Oncologist (Psychology)     Tobacco Use/Exposure:  None    Alcohol Use:  Occasional   Drug Use:  None   Diet:  Regular   Exercise: Walking or Treadmill (2 x week)   Hobbies: Clinical cytogeneticist, [employer].                There is no immunization history on file for this patient.   Objective: Vital Signs: There were no vitals taken for this visit.   Physical Exam   Musculoskeletal Exam: ***  CDAI Exam: CDAI Score: - Patient Global: -; Provider Global: - Swollen: -; Tender: - Joint Exam   No joint exam has been documented for this visit   There is currently no information documented on the homunculus. Go to the Rheumatology activity and complete the homunculus joint exam.  Investigation: No additional findings.  Imaging: Dg Chest Portable 1 View  Result Date: 01/12/2019 CLINICAL DATA:  Shortness of breath and possible covert exposure. EXAM: PORTABLE CHEST 1 VIEW COMPARISON:  Chest radiograph 05/11/2018 FINDINGS: Heart size within normal limits. No focal consolidation within the lungs. No evidence of pleural effusion or pneumothorax. No acute bony abnormality. Redemonstrated left subacromial spurring. IMPRESSION: Clear lungs. Electronically Signed   By: Jackey Loge   On: 01/12/2019 17:27    Recent Labs: Lab Results  Component Value Date   WBC 8.2 12/14/2018   HGB 13.6 12/14/2018   PLT 259 12/14/2018   NA 141 12/14/2018   K 3.3 (L) 12/14/2018   CL 106  12/14/2018   CO2 25 12/14/2018   GLUCOSE 109 (H) 12/14/2018   BUN 8 12/14/2018   CREATININE 0.85 12/14/2018   BILITOT 0.4 12/14/2018   ALKPHOS 70 12/14/2018   AST 21 12/14/2018   ALT 18 12/14/2018   PROT 6.4 (L) 12/14/2018   ALBUMIN 3.3 (L) 12/14/2018   CALCIUM 9.0 12/14/2018   GFRAA >60 12/14/2018   QFTBGOLD Negative 09/01/2016   QFTBGOLDPLUS Negative 08/24/2018    Speciality Comments: ORENCIA 1000 mg x 4 weeks TB GOLD negative 09/01/16  Procedures:  No procedures performed Allergies: Influenza vaccines and Isosorbide   Assessment / Plan:     Visit Diagnoses: No diagnosis found.  Orders: No orders of the defined types were placed in this encounter.  No orders of the defined types were placed in this encounter.   Face-to-face time spent with patient was *** minutes. Greater than 50% of time was spent in counseling and coordination of care.  Follow-Up Instructions: No follow-ups on file.   Gearldine Bienenstock, PA-C  Note - This record has been created using Dragon software.  Chart creation errors have been sought, but may not always  have been located. Such creation errors do not reflect on  the standard of medical care.  

## 2019-01-26 ENCOUNTER — Other Ambulatory Visit: Payer: Self-pay | Admitting: Physician Assistant

## 2019-01-26 NOTE — Telephone Encounter (Signed)
Last Visit: 10/27/2018 telemedicine  Next Visit: 02/07/2019  Okay to refill per Dr. Estanislado Pandy.

## 2019-02-07 ENCOUNTER — Ambulatory Visit: Payer: BLUE CROSS/BLUE SHIELD | Admitting: Physician Assistant

## 2019-02-08 ENCOUNTER — Other Ambulatory Visit: Payer: Self-pay

## 2019-02-08 ENCOUNTER — Ambulatory Visit (HOSPITAL_COMMUNITY)
Admission: RE | Admit: 2019-02-08 | Discharge: 2019-02-08 | Disposition: A | Discharge status: Home / Self Care | Payer: BC Managed Care – PPO | Source: Ambulatory Visit | Attending: Rheumatology | Admitting: Rheumatology

## 2019-02-08 DIAGNOSIS — M0579 Rheumatoid arthritis with rheumatoid factor of multiple sites without organ or systems involvement: Secondary | ICD-10-CM | POA: Diagnosis present

## 2019-02-08 LAB — COMPREHENSIVE METABOLIC PANEL
ALT: 15 U/L (ref 0–44)
AST: 19 U/L (ref 15–41)
Albumin: 2.9 g/dL — ABNORMAL LOW (ref 3.5–5.0)
Alkaline Phosphatase: 67 U/L (ref 38–126)
Anion gap: 10 (ref 5–15)
BUN: 12 mg/dL (ref 6–20)
CO2: 23 mmol/L (ref 22–32)
Calcium: 8.3 mg/dL — ABNORMAL LOW (ref 8.9–10.3)
Chloride: 105 mmol/L (ref 98–111)
Creatinine, Ser: 0.86 mg/dL (ref 0.44–1.00)
GFR calc Af Amer: >60 mL/min (ref >60)
GFR calc non Af Amer: >60 mL/min (ref >60)
Glucose, Bld: 116 mg/dL — ABNORMAL HIGH (ref 70–99)
Potassium: 3.8 mmol/L (ref 3.5–5.1)
Sodium: 138 mmol/L (ref 135–145)
Total Bilirubin: 0.4 mg/dL (ref 0.3–1.2)
Total Protein: 5.9 g/dL — ABNORMAL LOW (ref 6.5–8.1)

## 2019-02-08 LAB — CBC
HCT: 43.4 % (ref 36.0–46.0)
Hemoglobin: 13.1 g/dL (ref 12.0–15.0)
MCH: 26.4 pg (ref 26.0–34.0)
MCHC: 30.2 g/dL (ref 30.0–36.0)
MCV: 87.5 fL (ref 80.0–100.0)
Platelets: 251 10*3/uL (ref 150–400)
RBC: 4.96 MIL/uL (ref 3.87–5.11)
RDW: 12.9 % (ref 11.5–15.5)
WBC: 8.7 10*3/uL (ref 4.0–10.5)
nRBC: 0 % (ref 0.0–0.2)

## 2019-02-08 MED ORDER — DIPHENHYDRAMINE HCL 25 MG PO CAPS
ORAL_CAPSULE | ORAL | Status: AC
  Administered 2019-02-08: 25 mg
  Filled 2019-02-08: qty 1

## 2019-02-08 MED ORDER — ACETAMINOPHEN 325 MG PO TABS: 650.0000 mg | ORAL_TABLET | ORAL | Status: DC

## 2019-02-08 MED ORDER — DIPHENHYDRAMINE HCL 25 MG PO CAPS: 25.0000 mg | ORAL_CAPSULE | ORAL | Status: DC

## 2019-02-08 MED ORDER — SODIUM CHLORIDE 0.9 % IV SOLN
1000.0000 mg | INTRAVENOUS | Status: AC
  Administered 2019-02-08: 1000 mg via INTRAVENOUS
  Filled 2019-02-08: qty 40

## 2019-02-08 MED ORDER — ACETAMINOPHEN 325 MG PO TABS
ORAL_TABLET | ORAL | Status: AC
  Administered 2019-02-08: 650 mg
  Filled 2019-02-08: qty 2

## 2019-02-08 NOTE — Progress Notes (Signed)
stable °

## 2019-02-23 ENCOUNTER — Other Ambulatory Visit: Payer: Self-pay | Admitting: Rheumatology

## 2019-02-26 NOTE — Telephone Encounter (Signed)
Last Visit: 10/27/2018 telemedicine  Next Visit: was due September 2020. Message sent to the front to schedule.   Okay to refill per Dr. Estanislado Pandy

## 2019-02-26 NOTE — Progress Notes (Deleted)
Office Visit Note  Patient: [name] [name]             Date of Birth: 1966-01-17           MRN: [id number]             PCP: Jonathon Resides, MD Referring: Jonathon Resides, MD Visit Date: 03/07/2019 Occupation: @GUAROCC @  Subjective:  No chief complaint on file.  Orencia IV 1000 mg every 28 days (last infusion 02/08/2019), Rasuvo 25 mg every 7 days, folic acid 2.2 mg daily. Last TB gold negative on 08/24/2018 and will monitor yearly. Most recent CBC/CMP within normal limits except low calcium on 02/08/2019.  History of Present Illness: [name] [name] is a 53 y.o. female ***   Activities of Daily Living:  Patient reports morning stiffness for *** {minute/hour:19697}.   Patient {ACTIONS;DENIES/REPORTS:21021675::"Denies"} nocturnal pain.  Difficulty dressing/grooming: {ACTIONS;DENIES/REPORTS:21021675::"Denies"} Difficulty climbing stairs: {ACTIONS;DENIES/REPORTS:21021675::"Denies"} Difficulty getting out of chair: {ACTIONS;DENIES/REPORTS:21021675::"Denies"} Difficulty using hands for taps, buttons, cutlery, and/or writing: {ACTIONS;DENIES/REPORTS:21021675::"Denies"}  No Rheumatology ROS completed.   PMFS History:  Patient Active Problem List   Diagnosis Date Noted  . High risk medication use 08/03/2016  . Fibromyalgia syndrome 08/03/2016  . Abnormal cardiac CT angiography   . Sacrococcygeal pain 02/20/2016  . Rheumatoid arthritis involving 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[place]) 01/24/2016  . 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 Dyslipidemia 01/30/2015  . MDD (major depressive disorder), recurrent severe, without psychosis ([place]) 12/22/2014  . Essential hypertension 08/25/2014  . Morbid obesity ([place]) 08/25/2014  . CAD (coronary artery disease), native coronary artery 08/24/2014  . Numbness and tingling in left arm   . Arm numbness left 08/22/2014  . Chest pain 08/22/2014  . Gastroesophageal reflux disease without esophagitis 07/23/2014  . Coronary artery calcification seen on CAT scan  02/19/2014  . Hip pain 10/23/2013  . Solitary pulmonary nodule 08/28/2013  . Chest pain, atypical 07/18/2013  . Heart palpitations 06/27/2013  . Cough 06/04/2013  . Diastolic dysfunction 57/84/6962  . SOB (shortness of breath) 05/09/2013  . Other malaise and fatigue 01/21/2013  . Stress and adjustment reaction 12/09/2012  . Right sided abdominal pain 11/29/2012  . 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/21/2012  . Morbid obesity with BMI of 50.0-59.9, adult ([place]) 11/21/2012  . Arthritis 07/13/2012  . Depression 07/13/2012  . Routine health maintenance 05/07/2012    Past Medical History:  Diagnosis Date  . Anxiety   . Coronary artery calcification seen on CAT scan    minimal CAD with 30% prox and mild LAD  . Depression   . Diastolic dysfunction   . Dyslipidemia 01/30/2015  . Esophageal ring   . Fibromyalgia   . Hiatal hernia   . HSV-1 infection   . Morbid obesity ([place])   . Rheumatoid arthritis(714.0)    M05.79  . Rheumatoid arthritis, seropositive, 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[place])    Treated with Orencia, TB neg 09/12/2015    Family History  Problem Relation Age of Onset  . Hyperlipidemia Mother   . Depression Mother   . Sarcoidosis Father   . Lung disease Father        Pleural Mesothelioma  . Cancer Father   . Heart attack Paternal Grandmother   . Hypertension Paternal Grandmother   . Hypertension Maternal Grandmother   . Diabetes Maternal Grandmother   . Stroke Neg Hx   . Colon cancer Neg Hx   . Esophageal cancer Neg Hx   . Rectal cancer Neg Hx   . Stomach cancer Neg Hx  Past Surgical History:  Procedure Laterality Date  . CARDIAC CATHETERIZATION N/A 06/11/2016   Procedure: Left Heart Cath and Coronary Angiography;  Surgeon: Corky Crafts, MD;  Location[facility] INVASIVE CV LAB;  Service: Cardiovascular;  Laterality: N/A;  . CESAREAN SECTION    . COLONOSCOPY  07/27/2017  . ESOPHAGEAL MANOMETRY N/A 10/28/2014   Procedure: ESOPHAGEAL MANOMETRY (EM);  Surgeon: Hilarie Fredrickson, MD;  Location: [facility] ENDOSCOPY;  Service: Endoscopy;  Laterality: N/A;  . HERNIA REPAIR     reports surgery on 3 hernias, with 2 more present  . LAPAROSCOPIC GASTRIC SLEEVE RESECTION  11/21/12   [facility]  . LEFT HEART CATHETERIZATION WITH CORONARY ANGIOGRAM N/A 08/26/2014   Procedure: LEFT HEART CATHETERIZATION WITH CORONARY ANGIOGRAM;  Surgeon: Runell Gess, MD;  Location: [facility] CATH LAB;  Service: Cardiovascular;  Laterality: N/A;  . TUBAL LIGATION     Social History   Social History Narrative   Marital Status: Married Probation officer)    Children:  Son [name]) Daughter [name])    Pets: None    Living Situation: Lives with husband and children    Occupation: Occupational psychologist Administrator)     Education: Oncologist (Psychology)     Tobacco Use/Exposure:  None    Alcohol Use:  Occasional   Drug Use:  None   Diet:  Regular   Exercise: Walking or Treadmill (2 x week)   Hobbies: Clinical cytogeneticist, Christmas Decorations.               Immunization History  Administered Date(s) Administered  . Tdap 09/21/2016     Objective: Vital Signs: There were no vitals taken for this visit.   Physical Exam   Musculoskeletal Exam: ***  CDAI Exam: CDAI Score: - Patient Global: -; Provider Global: - Swollen: -; Tender: - Joint Exam   No joint exam has been documented for this visit   There is currently no information documented on the homunculus. Go to the Rheumatology activity and complete the homunculus joint exam.  Investigation: No additional findings.  Imaging: No results found.  Recent Labs: Lab Results  Component Value Date   WBC 8.7 02/08/2019   HGB 13.1 02/08/2019   PLT 251 02/08/2019   NA 138 02/08/2019   K 3.8 02/08/2019   CL 105 02/08/2019   CO2 23 02/08/2019   GLUCOSE 116 (H) 02/08/2019   BUN 12 02/08/2019   CREATININE 0.86 02/08/2019   BILITOT 0.4 02/08/2019   ALKPHOS 67 02/08/2019   AST 19 02/08/2019   ALT 15 02/08/2019   PROT 5.9  (L) 02/08/2019   ALBUMIN 2.9 (L) 02/08/2019   CALCIUM 8.3 (L) 02/08/2019   GFRAA >60 02/08/2019   QFTBGOLD Negative 09/01/2016   QFTBGOLDPLUS Negative 08/24/2018    Speciality Comments: ORENCIA 1000 mg x 4 weeks  Procedures:  No procedures performed Allergies: Influenza vaccines and Isosorbide   Assessment / Plan:     Visit Diagnoses: No diagnosis found.  Orders: No orders of the defined types were placed in this encounter.  No orders of the defined types were placed in this encounter.   Face-to-face time spent with patient was *** minutes. Greater than 50% of time was spent in counseling and coordination of care.  Follow-Up Instructions: No follow-ups on file.   Ellen Henri, CMA  Note - This record has been created using Animal nutritionist.  Chart creation errors have been sought, but may not always  have been located. Such creation errors do not reflect on  the standard of medical care. 

## 2019-02-26 NOTE — Telephone Encounter (Signed)
Please schedule patient for a follow up visit. Patient was due September 2020. Thanks!  

## 2019-03-02 ENCOUNTER — Other Ambulatory Visit: Payer: Self-pay | Admitting: *Deleted

## 2019-03-02 NOTE — Progress Notes (Signed)
Infusion orders are current for patient CBC CMP Tylenol Benadryl appointments are up to date and follow up appointment  is scheduled TB gold not due yet.  

## 2019-03-07 ENCOUNTER — Telehealth (INDEPENDENT_AMBULATORY_CARE_PROVIDER_SITE_OTHER): Payer: Self-pay | Admitting: Rheumatology

## 2019-03-07 ENCOUNTER — Other Ambulatory Visit: Payer: Self-pay

## 2019-03-07 ENCOUNTER — Encounter: Payer: Self-pay | Admitting: Rheumatology

## 2019-03-07 DIAGNOSIS — M7061 Trochanteric bursitis, right hip: Secondary | ICD-10-CM

## 2019-03-07 DIAGNOSIS — M0579 Rheumatoid arthritis with rheumatoid factor of multiple sites without organ or systems involvement: Secondary | ICD-10-CM

## 2019-03-07 DIAGNOSIS — Z8659 Personal history of other mental and behavioral disorders: Secondary | ICD-10-CM

## 2019-03-07 DIAGNOSIS — M7062 Trochanteric bursitis, left hip: Secondary | ICD-10-CM

## 2019-03-07 DIAGNOSIS — R911 Solitary pulmonary nodule: Secondary | ICD-10-CM

## 2019-03-07 DIAGNOSIS — Z8639 Personal history of other endocrine, nutritional and metabolic disease: Secondary | ICD-10-CM

## 2019-03-07 DIAGNOSIS — Z8679 Personal history of other diseases of the circulatory system: Secondary | ICD-10-CM

## 2019-03-07 DIAGNOSIS — R5383 Other fatigue: Secondary | ICD-10-CM

## 2019-03-07 DIAGNOSIS — Z79899 Other long term (current) drug therapy: Secondary | ICD-10-CM

## 2019-03-07 DIAGNOSIS — E785 Hyperlipidemia, unspecified: Secondary | ICD-10-CM

## 2019-03-07 DIAGNOSIS — M170 Bilateral primary osteoarthritis of knee: Secondary | ICD-10-CM

## 2019-03-07 DIAGNOSIS — G4709 Other insomnia: Secondary | ICD-10-CM

## 2019-03-07 DIAGNOSIS — M797 Fibromyalgia: Secondary | ICD-10-CM

## 2019-03-07 DIAGNOSIS — Z9884 Bariatric surgery status: Secondary | ICD-10-CM

## 2019-03-07 MED ORDER — DICLOFENAC SODIUM 1 % TD GEL: TRANSDERMAL | 4 refills | Status: DC

## 2019-03-07 NOTE — Progress Notes (Signed)
Virtual Visit via Video Note  I connected with Brayton Caves on 03/07/19 at  3:45 PM EDT by a video enabled telemedicine application and verified that I am speaking with the correct person using two identifiers.  Location: Patient: Home  Provider: Clinic    I discussed the limitations of evaluation and management by telemedicine and the availability of in person appointments. The patient expressed understanding and agreed to proceed.  CC: pain in both hands History of Present Illness: Patient is a seropositive rheumatoid arthritis, osteoarthritis, and fibromyalgia.  She is on Orencia IV infusions every 28 days, Rasuvo 25 mg sq once weekly, and folic acid 2 mg po daily.  She has not missed any infusions or injections recently. She reports she has pain and swelling in both hands and both wrist joints.  She is having difficulty making a complete fist.  She is having discomfort in both ankle joints but she denies any joint swelling.   She is currently having a fibromyalgia flare.  She states she has more frequent flares with weather changes. She continues to take gabapentin 100 mg twice daily and flexeril 10 mg po at bedtime for muscle spasms.    Review of Systems  Constitutional: Positive for malaise/fatigue. Negative for fever.  Eyes: Negative for photophobia, pain, discharge and redness.  Respiratory: Negative for cough, shortness of breath and wheezing.   Cardiovascular: Negative for chest pain and palpitations.  Gastrointestinal: Negative for blood in stool, constipation and diarrhea.  Genitourinary: Negative for dysuria.  Musculoskeletal: Positive for joint pain and myalgias. Negative for back pain and neck pain.       +Joint swelling +Morning stiffness   Skin: Negative for rash.  Neurological: Negative for dizziness and headaches.  Psychiatric/Behavioral: Negative for depression. The patient is nervous/anxious and has insomnia.       Observations/Objective: Physical Exam   Constitutional: She is oriented to person, place, and time and well-developed, well-nourished, and in no distress.  HENT:  Head: Normocephalic and atraumatic.  Eyes: Conjunctivae are normal.  Pulmonary/Chest: Effort normal.  Neurological: She is alert and oriented to person, place, and time.  Psychiatric: Mood, memory, affect and judgment normal.   Patient reports morning stiffness for several hours Patient reports nocturnal pain.  Difficulty dressing/grooming: Denies Difficulty climbing stairs: Reports Difficulty getting out of chair: Reports Difficulty using hands for taps, buttons, cutlery, and/or writing: Reports   Assessment and Plan: Rheumatoid arthritis involving multiple sites with positive rheumatoid factor ([place]):She had an ultrasound of both hands on 06/21/18 that revealed ongoing synovitis despite receiving IV Orencia infusions and Rasuvo 25 mg sq injections: She continues to have recurrent rheumatoid arthritis flares.  She states she is currently having pain and swelling in both hands and both wrist joints.  She has pain with range of motion of both wrists today.  She has intermittent discomfort in both knee joints in both shoulder joints and applies Voltaren gel topically as needed.  She has ongoing pain in both ankle joints but has not had any joint swelling recently.  She has not missed any Orencia IV infusions or Rasuvo 25 mg subcutaneous injections once weekly.  She continues to have recurrent fibromyalgia flares was also contributes to her level of pain. She has failed Anti-TNFs in the past.  We previously discussed switching her to Lytle Michaels, or a small molecule agent. She had a colonoscopy on 07/07/18 that revealed a few small scattered diverticula, but she has not had acute diverticulitis in the past.  She  continues to be apprehensive of changing medications at this time.  She will follow up in 6 weeks.   High risk medication use - Orencia IV infusions, Rasuvo 25  subcutaneous once weekly injections, folic acid 2mg  po qd.CBC and CMP were drawn on 02/08/19. TB gold negative on 08/24/18.    Primary osteoarthritis of both knees: She has intermittent discomfort in both knee joints.  She is not having any joint swelling currently.  She requested a refill Voltaren gel which she can apply topically as needed for pain relief.  Fibromyalgia:She is currently having a fibromyalgia flare, which she attributes to recent cooler temperatures. She takes gabapentin 100 mg by mouth daily and Flexeril 10 mg by mouth at bedtime to help with muscle spasms.    She was advised to discuss with her PCP possibly switching from Wellbutrin to Cymbalta to help with pain relief.  She was encouraged to stay active and exercise on a regular basis.  Trochanteric bursitis of both hips:She has tenderness over trochanteric bursa bilaterally.  She experiences discomfort when lying on her sides at night.  Other insomnia: She continues to take Flexeril 10 mg by mouth at bedtime.  Other fatigue:She has chronic fatigue related to insomnia    Follow Up Instructions: She will follow up in 6 weeks.    I discussed the assessment and treatment plan with the patient. The patient was provided an opportunity to ask questions and all were answered. The patient agreed with the plan and demonstrated an understanding of the instructions.   The patient was advised to call back or seek an in-person evaluation if the symptoms worsen or if the condition fails to improve as anticipated.  I provided 25 minutes of non-face-to-face time during this encounter.  08/26/18, MD   Scribed by-  Pollyann Savoy, PA-C

## 2019-03-08 ENCOUNTER — Other Ambulatory Visit: Payer: Self-pay

## 2019-03-08 ENCOUNTER — Ambulatory Visit (HOSPITAL_COMMUNITY)
Admission: RE | Admit: 2019-03-08 | Discharge: 2019-03-08 | Disposition: A | Discharge status: Home / Self Care | Payer: BC Managed Care – PPO | Source: Ambulatory Visit | Attending: Rheumatology | Admitting: Rheumatology

## 2019-03-08 DIAGNOSIS — M0579 Rheumatoid arthritis with rheumatoid factor of multiple sites without organ or systems involvement: Secondary | ICD-10-CM | POA: Diagnosis present

## 2019-03-08 MED ORDER — DIPHENHYDRAMINE HCL 25 MG PO CAPS
25.0000 mg | ORAL_CAPSULE | ORAL | Status: DC
  Administered 2019-03-08: 12:00:00 25 mg via ORAL

## 2019-03-08 MED ORDER — ACETAMINOPHEN 325 MG PO TABS
650.0000 mg | ORAL_TABLET | ORAL | Status: DC
  Administered 2019-03-08: 11:00:00 650 mg via ORAL

## 2019-03-08 MED ORDER — DIPHENHYDRAMINE HCL 25 MG PO CAPS
ORAL_CAPSULE | ORAL | Status: AC
  Administered 2019-03-08: 25 mg via ORAL
  Filled 2019-03-08: qty 1

## 2019-03-08 MED ORDER — ACETAMINOPHEN 325 MG PO TABS
ORAL_TABLET | ORAL | Status: AC
  Administered 2019-03-08: 650 mg via ORAL
  Filled 2019-03-08: qty 2

## 2019-03-08 MED ORDER — SODIUM CHLORIDE 0.9 % IV SOLN
1000.0000 mg | INTRAVENOUS | Status: DC
  Administered 2019-03-08: 1000 mg via INTRAVENOUS
  Filled 2019-03-08: qty 40

## 2019-03-25 ENCOUNTER — Other Ambulatory Visit: Payer: Self-pay | Admitting: Rheumatology

## 2019-03-26 NOTE — Telephone Encounter (Signed)
Last Visit: 03/07/19 Next Visit: 04/18/19   Okay to refill per Dr. Deveshwar  

## 2019-04-04 NOTE — Progress Notes (Deleted)
Office Visit Note  Patient: [name] [name]             Date of Birth: Sep 08, 1965           MRN: [id number]             PCP: [name], MD Referring: [name], MD Visit Date: 04/18/2019 Occupation: @GUAROCC @  Subjective:  No chief complaint on file.  Orencia IV infusion 1000 mg every 28 days (last infusion 04/05/2019), Rasuvo 25 mg every 7 days, and FABB 1 tablet twice daily. Last TB gold negative on 08/24/2018 and will monitor yearly.  Most recent CBC/CMP within normal limits on 04/15/2019 and will monitor every 3 months.    History of Present Illness: [name] [name] is a 53 y.o. female ***   Activities of Daily Living:  Patient reports morning stiffness for *** {minute/hour:19697}.   Patient {ACTIONS;DENIES/REPORTS:21021675::"Denies"} nocturnal pain.  Difficulty dressing/grooming: {ACTIONS;DENIES/REPORTS:21021675::"Denies"} Difficulty climbing stairs: {ACTIONS;DENIES/REPORTS:21021675::"Denies"} Difficulty getting out of chair: {ACTIONS;DENIES/REPORTS:21021675::"Denies"} Difficulty using hands for taps, buttons, cutlery, and/or writing: {ACTIONS;DENIES/REPORTS:21021675::"Denies"}  No Rheumatology ROS completed.   PMFS History:  Patient Active Problem List   Diagnosis Date Noted  . High risk medication use 08/03/2016  . Fibromyalgia syndrome 08/03/2016  . Abnormal cardiac CT angiography   . Sacrococcygeal pain 02/20/2016  . Rheumatoid arthritis involving 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[facility]) 01/24/2016  . 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 Dyslipidemia 01/30/2015  . 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 Essential hypertension 08/25/2014  . Morbid obesity ([facility]) 08/25/2014  . CAD (coronary artery disease), native coronary artery 08/24/2014  . Numbness and tingling in left arm   . Arm numbness left 08/22/2014  . Chest pain 08/22/2014  . Gastroesophageal reflux disease without esophagitis 07/23/2014  . Coronary  artery calcification seen on CAT scan 02/19/2014  . Hip pain 10/23/2013  . Solitary pulmonary nodule 08/28/2013  . Chest pain, atypical 07/18/2013  . Heart palpitations 06/27/2013  . Cough 06/04/2013  . Diastolic dysfunction 05/18/2013  . SOB (shortness of breath) 05/09/2013  . Other malaise and fatigue 01/21/2013  . Stress and adjustment reaction 12/09/2012  . Right sided abdominal pain 11/29/2012  . 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/21/2012  . Morbid obesity with BMI of 50.0-59.9, adult ([facility]) 11/21/2012  . Arthritis 07/13/2012  . Depression 07/13/2012  . Routine health maintenance 05/07/2012    Past Medical History:  Diagnosis Date  . Anxiety   . Coronary artery calcification seen on CAT scan    minimal CAD with 30% prox and mild LAD  . Depression   . Diastolic dysfunction   . Dyslipidemia 01/30/2015  . Esophageal ring   . Fibromyalgia   . Hiatal hernia   . HSV-1 infection   . Morbid obesity ([facility])   . Rheumatoid arthritis(714.0)    M05.79  . Rheumatoid arthritis, seropositive, 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[facility])    Treated with Orencia, TB neg 09/12/2015    Family History  Problem Relation Age of Onset  . Hyperlipidemia Mother   . Depression Mother   . Sarcoidosis Father   . Lung disease Father        Pleural Mesothelioma  . Cancer Father   . Heart attack Paternal Grandmother   . Hypertension Paternal Grandmother   . Hypertension Maternal Grandmother   . Diabetes Maternal Grandmother   . Stroke Neg Hx   . Colon cancer Neg Hx   . Esophageal cancer Neg Hx   . Rectal cancer Neg Hx   .  Stomach cancer Neg Hx    Past Surgical History:  Procedure Laterality Date  . CARDIAC CATHETERIZATION N/A 06/11/2016   Procedure: Left Heart Cath and Coronary Angiography;  Surgeon: Jettie Booze, MD;  Location: [place] CV LAB;  Service: Cardiovascular;  Laterality: N/A;  . CESAREAN SECTION    . COLONOSCOPY  07/27/2017  . ESOPHAGEAL MANOMETRY N/A 10/28/2014   Procedure:  ESOPHAGEAL MANOMETRY (EM);  Surgeon: Irene Shipper, MD;  Location: [facility] ENDOSCOPY;  Service: Endoscopy;  Laterality: N/A;  . HERNIA REPAIR     reports surgery on 3 hernias, with 2 more present  . LAPAROSCOPIC GASTRIC SLEEVE RESECTION  11/21/12   [facility]  . LEFT HEART CATHETERIZATION WITH CORONARY ANGIOGRAM N/A 08/26/2014   Procedure: LEFT HEART CATHETERIZATION WITH CORONARY ANGIOGRAM;  Surgeon: Lorretta Harp, MD;  Location: [facility] CATH LAB;  Service: Cardiovascular;  Laterality: N/A;  . TUBAL LIGATION     Social History   Social History Narrative   Marital Status: Married Aeronautical engineer)    Children:  Son Marcello Moores) Daughter Margreta Journey)    Pets: None    Living Situation: Lives with husband and children    Occupation: Radiation protection practitioner Scientist, clinical (histocompatibility and immunogenetics))     Education: Dietitian (Psychology)     Tobacco Use/Exposure:  None    Alcohol Use:  Occasional   Drug Use:  None   Diet:  Regular   Exercise: Walking or Treadmill (2 x week)   Hobbies: Barrister's clerk, Christmas Decorations.               Immunization History  Administered Date(s) Administered  . Tdap 09/21/2016     Objective: Vital Signs: There were no vitals taken for this visit.   Physical Exam   Musculoskeletal Exam: ***  CDAI Exam: CDAI Score: - Patient Global: -; Provider Global: - Swollen: -; Tender: - Joint Exam   No joint exam has been documented for this visit   There is currently no information documented on the homunculus. Go to the Rheumatology activity and complete the homunculus joint exam.  Investigation: No additional findings.  Imaging: No results found.  Recent Labs: Lab Results  Component Value Date   WBC 8.7 02/08/2019   HGB 13.1 02/08/2019   PLT 251 02/08/2019   NA 138 02/08/2019   K 3.8 02/08/2019   CL 105 02/08/2019   CO2 23 02/08/2019   GLUCOSE 116 (H) 02/08/2019   BUN 12 02/08/2019   CREATININE 0.86 02/08/2019   BILITOT 0.4 02/08/2019   ALKPHOS 67 02/08/2019   AST 19  02/08/2019   ALT 15 02/08/2019   PROT 5.9 (L) 02/08/2019   ALBUMIN 2.9 (L) 02/08/2019   CALCIUM 8.3 (L) 02/08/2019   GFRAA >60 02/08/2019   QFTBGOLD Negative 09/01/2016   QFTBGOLDPLUS Negative 08/24/2018    Speciality Comments: ORENCIA 1000 mg x 4 weeks  Procedures:  No procedures performed Allergies: Influenza vaccines and Isosorbide   Assessment / Plan:     Visit Diagnoses: No diagnosis found.  Orders: No orders of the defined types were placed in this encounter.  No orders of the defined types were placed in this encounter.   Face-to-face time spent with patient was *** minutes. Greater than 50% of time was spent in counseling and coordination of care.  Follow-Up Instructions: No follow-ups on file.   Earnestine Mealing, CMA  Note - This record has been created using Editor, commissioning.  Chart creation errors have been sought, but may not always  have been located.  Such creation errors do not reflect on  the standard of medical care.

## 2019-04-05 ENCOUNTER — Ambulatory Visit (HOSPITAL_COMMUNITY)
Admission: RE | Admit: 2019-04-05 | Discharge: 2019-04-05 | Disposition: A | Discharge status: Home / Self Care | Payer: BC Managed Care – PPO | Source: Ambulatory Visit | Attending: Rheumatology | Admitting: Rheumatology

## 2019-04-05 ENCOUNTER — Other Ambulatory Visit: Payer: Self-pay

## 2019-04-05 DIAGNOSIS — M0579 Rheumatoid arthritis with rheumatoid factor of multiple sites without organ or systems involvement: Secondary | ICD-10-CM | POA: Diagnosis present

## 2019-04-05 LAB — CBC
HCT: 43.8 % (ref 36.0–46.0)
Hemoglobin: 13.7 g/dL (ref 12.0–15.0)
MCH: 26.7 pg (ref 26.0–34.0)
MCHC: 31.3 g/dL (ref 30.0–36.0)
MCV: 85.4 fL (ref 80.0–100.0)
Platelets: 275 10*3/uL (ref 150–400)
RBC: 5.13 MIL/uL — ABNORMAL HIGH (ref 3.87–5.11)
RDW: 12.5 % (ref 11.5–15.5)
WBC: 8.7 10*3/uL (ref 4.0–10.5)
nRBC: 0 % (ref 0.0–0.2)

## 2019-04-05 LAB — COMPREHENSIVE METABOLIC PANEL
ALT: 16 U/L (ref 0–44)
AST: 20 U/L (ref 15–41)
Albumin: 3.4 g/dL — ABNORMAL LOW (ref 3.5–5.0)
Alkaline Phosphatase: 65 U/L (ref 38–126)
Anion gap: 9 (ref 5–15)
BUN: 14 mg/dL (ref 6–20)
CO2: 26 mmol/L (ref 22–32)
Calcium: 9.3 mg/dL (ref 8.9–10.3)
Chloride: 106 mmol/L (ref 98–111)
Creatinine, Ser: 0.92 mg/dL (ref 0.44–1.00)
GFR calc Af Amer: >60 mL/min (ref >60)
GFR calc non Af Amer: >60 mL/min (ref >60)
Glucose, Bld: 95 mg/dL (ref 70–99)
Potassium: 4 mmol/L (ref 3.5–5.1)
Sodium: 141 mmol/L (ref 135–145)
Total Bilirubin: 0.6 mg/dL (ref 0.3–1.2)
Total Protein: 6.7 g/dL (ref 6.5–8.1)

## 2019-04-05 MED ORDER — SODIUM CHLORIDE 0.9 % IV SOLN
1000.0000 mg | INTRAVENOUS | Status: DC
  Administered 2019-04-05: 1000 mg via INTRAVENOUS
  Filled 2019-04-05: qty 40

## 2019-04-05 MED ORDER — ACETAMINOPHEN 325 MG PO TABS
650.0000 mg | ORAL_TABLET | ORAL | Status: DC
  Administered 2019-04-05: 11:00:00 650 mg via ORAL

## 2019-04-05 MED ORDER — ACETAMINOPHEN 325 MG PO TABS
ORAL_TABLET | ORAL | Status: AC
  Filled 2019-04-05: qty 2

## 2019-04-05 MED ORDER — DIPHENHYDRAMINE HCL 25 MG PO CAPS: 25.0000 mg | ORAL_CAPSULE | ORAL | Status: DC

## 2019-04-05 MED ORDER — DIPHENHYDRAMINE HCL 25 MG PO CAPS
ORAL_CAPSULE | ORAL | Status: AC
  Administered 2019-04-05: 11:00:00 25 mg
  Filled 2019-04-05: qty 1

## 2019-04-15 ENCOUNTER — Encounter (HOSPITAL_COMMUNITY): Payer: Self-pay | Admitting: Emergency Medicine

## 2019-04-15 ENCOUNTER — Emergency Department (HOSPITAL_COMMUNITY): Payer: BC Managed Care – PPO

## 2019-04-15 ENCOUNTER — Emergency Department (HOSPITAL_COMMUNITY)
Admission: EM | Admit: 2019-04-15 | Discharge: 2019-04-15 | Disposition: A | Discharge status: Home / Self Care | Payer: BC Managed Care – PPO | Attending: Emergency Medicine | Admitting: Emergency Medicine

## 2019-04-15 DIAGNOSIS — R0789 Other chest pain: Secondary | ICD-10-CM | POA: Diagnosis present

## 2019-04-15 DIAGNOSIS — I2510 Atherosclerotic heart disease of native coronary artery without angina pectoris: Secondary | ICD-10-CM | POA: Insufficient documentation

## 2019-04-15 DIAGNOSIS — Z79899 Other long term (current) drug therapy: Secondary | ICD-10-CM | POA: Insufficient documentation

## 2019-04-15 DIAGNOSIS — R079 Chest pain, unspecified: Secondary | ICD-10-CM

## 2019-04-15 DIAGNOSIS — I5032 Chronic diastolic (congestive) heart failure: Secondary | ICD-10-CM | POA: Insufficient documentation

## 2019-04-15 LAB — COMPREHENSIVE METABOLIC PANEL
ALT: 14 U/L (ref 0–44)
AST: 18 U/L (ref 15–41)
Albumin: 3 g/dL — ABNORMAL LOW (ref 3.5–5.0)
Alkaline Phosphatase: 65 U/L (ref 38–126)
Anion gap: 7 (ref 5–15)
BUN: 12 mg/dL (ref 6–20)
CO2: 27 mmol/L (ref 22–32)
Calcium: 8.7 mg/dL — ABNORMAL LOW (ref 8.9–10.3)
Chloride: 109 mmol/L (ref 98–111)
Creatinine, Ser: 0.89 mg/dL (ref 0.44–1.00)
GFR calc Af Amer: >60 mL/min (ref >60)
GFR calc non Af Amer: >60 mL/min (ref >60)
Glucose, Bld: 92 mg/dL (ref 70–99)
Potassium: 3.9 mmol/L (ref 3.5–5.1)
Sodium: 143 mmol/L (ref 135–145)
Total Bilirubin: 0.4 mg/dL (ref 0.3–1.2)
Total Protein: 6.1 g/dL — ABNORMAL LOW (ref 6.5–8.1)

## 2019-04-15 LAB — CBC WITH DIFFERENTIAL/PLATELET
Abs Immature Granulocytes: 0.02 10*3/uL (ref 0.00–0.07)
Basophils Absolute: 0.1 10*3/uL (ref 0.0–0.1)
Basophils Relative: 1 %
Eosinophils Absolute: 0.2 10*3/uL (ref 0.0–0.5)
Eosinophils Relative: 2 %
HCT: 42.1 % (ref 36.0–46.0)
Hemoglobin: 12.8 g/dL (ref 12.0–15.0)
Immature Granulocytes: 0 %
Lymphocytes Relative: 51 %
Lymphs Abs: 4 10*3/uL (ref 0.7–4.0)
MCH: 26.4 pg (ref 26.0–34.0)
MCHC: 30.4 g/dL (ref 30.0–36.0)
MCV: 86.8 fL (ref 80.0–100.0)
Monocytes Absolute: 0.7 10*3/uL (ref 0.1–1.0)
Monocytes Relative: 9 %
Neutro Abs: 2.9 10*3/uL (ref 1.7–7.7)
Neutrophils Relative %: 37 %
Platelets: 257 10*3/uL (ref 150–400)
RBC: 4.85 MIL/uL (ref 3.87–5.11)
RDW: 12.5 % (ref 11.5–15.5)
WBC: 7.8 10*3/uL (ref 4.0–10.5)
nRBC: 0 % (ref 0.0–0.2)

## 2019-04-15 LAB — TROPONIN I (HIGH SENSITIVITY)
Troponin I (High Sensitivity): 3 ng/L (ref <18)
Troponin I (High Sensitivity): 3 ng/L (ref <18)

## 2019-04-15 LAB — D-DIMER, QUANTITATIVE: D-Dimer, Quant: 0.35 ug/mL-FEU (ref 0.00–0.50)

## 2019-04-15 MED ORDER — ALUM & MAG HYDROXIDE-SIMETH 200-200-20 MG/5ML PO SUSP
30.0000 mL | Freq: Once | ORAL | Status: AC
  Administered 2019-04-15: 30 mL via ORAL
  Filled 2019-04-15: qty 30

## 2019-04-15 MED ORDER — PREDNISONE 20 MG PO TABS: 40.0000 mg | ORAL_TABLET | Freq: Every day | ORAL | 0 refills | Status: DC

## 2019-04-15 MED ORDER — LIDOCAINE VISCOUS HCL 2 % MT SOLN
15.0000 mL | Freq: Once | OROMUCOSAL | Status: AC
  Administered 2019-04-15: 15 mL via ORAL
  Filled 2019-04-15: qty 15

## 2019-04-15 NOTE — ED Notes (Signed)
Pt is NSR on monitor 

## 2019-04-15 NOTE — ED Notes (Signed)
Gave pt Ginger Ale, per Kathlee Nations - RN.

## 2019-04-15 NOTE — ED Provider Notes (Addendum)
[facility] EMERGENCY DEPARTMENT Provider Note   CSN: [id number] Arrival date & time: 04/15/19  1130     History   Chief Complaint Chief Complaint  Patient presents with  . Chest Pain    HPI [name] [name] is a 53 y.o. female.     HPI Patient presents with shortness of breath and chest pain.  Has had somewhat over the last week along with shortness of breath.  Today woke up with it at 7:00 and is been more severe.  8 out of 10.  Improved now after nitroglycerin by EMS.  No nausea or vomiting.  States she has been more fatigued and does have more shortness of breath with walking.  Still has some dull pain.  Is dull in her mid chest.  Similar pain she was having couple years ago.  Had a negative heart cath at that time.  No swelling in her legs.  History of rheumatoid arthritis.  No nausea or vomiting.  No fever or chills.  No cough. Past Medical History:  Diagnosis Date  . Anxiety   . Coronary artery calcification seen on CAT scan    minimal CAD with 30% prox and mild LAD  . Depression   . Diastolic dysfunction   . Dyslipidemia 01/30/2015  . Esophageal ring   . Fibromyalgia   . Hiatal hernia   . HSV-1 infection   . Morbid obesity ([place])   . Rheumatoid arthritis(714.0)    M05.79  . Rheumatoid arthritis, seropositive, 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[place])    Treated with Orencia, TB neg 09/12/2015    Patient Active Problem List   Diagnosis Date Noted  . High risk medication use 08/03/2016  . Fibromyalgia syndrome 08/03/2016  . Abnormal cardiac CT angiography   . Sacrococcygeal pain 02/20/2016  . Rheumatoid arthritis involving 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[place]) 01/24/2016  . 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 Dyslipidemia 01/30/2015  . MDD (major depressive disorder), recurrent severe, without psychosis ([place]) 12/22/2014  . Essential hypertension 08/25/2014  . Morbid obesity ([place]) 08/25/2014  . CAD (coronary artery disease), native coronary artery 08/24/2014  . Numbness  and tingling in left arm   . Arm numbness left 08/22/2014  . Chest pain 08/22/2014  . Gastroesophageal reflux disease without esophagitis 07/23/2014  . Coronary artery calcification seen on CAT scan 02/19/2014  . Hip pain 10/23/2013  . Solitary pulmonary nodule 08/28/2013  . Chest pain, atypical 07/18/2013  . Heart palpitations 06/27/2013  . Cough 06/04/2013  . Diastolic dysfunction 19/37/9024  . SOB (shortness of breath) 05/09/2013  . Other malaise and fatigue 01/21/2013  . Stress and adjustment reaction 12/09/2012  . Right sided abdominal pain 11/29/2012  . 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/21/2012  . Morbid obesity with BMI of 50.0-59.9, adult ([place]) 11/21/2012  . Arthritis 07/13/2012  . Depression 07/13/2012  . Routine health maintenance 05/07/2012    Past Surgical History:  Procedure Laterality Date  . CARDIAC CATHETERIZATION N/A 06/11/2016   Procedure: Left Heart Cath and Coronary Angiography;  Surgeon: Jettie Booze, MD;  Location: [facility];  Service: Cardiovascular;  Laterality: N/A;  . CESAREAN SECTION    . COLONOSCOPY  07/27/2017  . ESOPHAGEAL MANOMETRY N/A 10/28/2014   Procedure: ESOPHAGEAL MANOMETRY (EM);  Surgeon: Irene Shipper, MD;  Location: [facility];  Service: Endoscopy;  Laterality: N/A;  . HERNIA REPAIR     reports surgery on 3 hernias, with 2 more present  . LAPAROSCOPIC GASTRIC SLEEVE RESECTION  11/21/12   [facility]  . LEFT HEART CATHETERIZATION WITH CORONARY ANGIOGRAM N/A 08/26/2014   Procedure: LEFT HEART CATHETERIZATION WITH CORONARY ANGIOGRAM;  Surgeon: Runell Gess, MD;  Location: [facility] C[facility] LAB;  Service: Cardiovascular;  Laterality: N/A;  . TUBAL LIGATION       OB History   No obstetric history on file.      Home Medications    Prior to Admission medications   Medication Sig Start Date End Date Taking? Authorizing Provider  Abatacept (ORENCIA IV) Inject 1,000 mg into the vein every 28 (twenty-eight) days.    Yes  [provider]  Adapalene 0.3 % gel Apply 1 application topically daily as needed (acne).  12/31/14  Yes [provider]  ARIPiprazole (ABILIFY) 2 MG tablet Take 1 tablet (2 mg total) by mouth daily. 04/22/15  Yes Benjaman Pott, MD  Betamethasone Valerate 0.12 % foam Apply 1 application topically daily as needed (itching). Itching  04/22/15  Yes [provider]  buPROPion (WELLBUTRIN XL) 300 MG 24 hr tablet Take 1 tablet (300 mg total) by mouth daily. 12/28/14  Yes Adonis Brook, NP  cetirizine (ZYRTEC) 10 MG tablet Take 10 mg by mouth daily as needed for allergies.  12/14/17  Yes [provider]  clindamycin-benzoyl peroxide (BENZACLIN) gel Apply 1 application topically daily as needed (acne).  02/13/15  Yes [provider]  clobetasol cream (TEMOVATE) 0.05 % Apply 1 application topically daily as needed (skin discoloration).  04/22/15  Yes [provider]  cyclobenzaprine (FLEXERIL) 10 MG tablet Take 1 tablet by mouth daily at bedtime as needed for muscle spasms. 06/02/18  Yes Gearldine Bienenstock, PA-C  diclofenac sodium (VOLTAREN) 1 % GEL APPLY 3 GRAMS TO 3 LARGE JOINTS UP TO THREE TIMES A DAY AS NEEDED Patient taking differently: Apply 2 g topically daily as needed (pain).  02/07/18  Yes Deveshwar, Janalyn Rouse, MD  diclofenac sodium (VOLTAREN) 1 % GEL Apply 2-4 grams to affected joints up to 4 times daily as needed. Patient taking differently: Apply 2 g topically daily as needed (pain).  03/07/19  Yes Gearldine Bienenstock, PA-C  famciclovir (FAMVIR) 500 MG tablet Take 15,000 mg by mouth daily as needed (fever blister).  01/21/15  Yes [provider]  fluticasone (FLONASE) 50 MCG/ACT nasal spray Place 1 spray into both nostrils daily. Patient taking differently: Place 1 spray into both nostrils daily as needed for allergies.  06/18/18  Yes Caccavale, Sophia, PA-C  Folic Acid-Vit B6-Vit B12 (FABB) 2.2-25-1 MG TABS Take 1 tablet by mouth daily. Patient  taking differently: Take 1 tablet by mouth 2 (two) times daily.  11/11/16  Yes Panwala, Naitik, PA-C  gabapentin (NEURONTIN) 100 MG capsule TAKE 1 CAPSULE BY MOUTH EVERY DAY Patient taking differently: Take 100 mg by mouth at bedtime. TAKE 1 CAPSULE BY MOUTH EVERY DAY 11/20/18  Yes Deveshwar, Janalyn Rouse, MD  hydrOXYzine (VISTARIL) 25 MG capsule Take 25 mg by mouth every 8 (eight) hours as needed for anxiety.  12/30/14  Yes [provider]  lidocaine (LIDODERM) 5 % PLACE 1 PATCH ONTO THE SKIN DAILY. REMOVE & DISCARD PATCH WITHIN 12 HOURS OR AS DIRECTED BY MD Patient taking differently: Place 1 patch onto the skin daily as needed (pain).  03/26/19  Yes Deveshwar, Janalyn Rouse, MD  nitroGLYCERIN (NITROSTAT) 0.4 MG SL tablet PLACE 1 TABLET UNDER THE TONGUE EVERY 5 MINUTES AS NEEDED FOR CHEST PAIN. Patient taking differently: Place 0.4 mg under the tongue every 5 (five) minutes as needed for chest pain. As  emergency guide 09/06/16  Yes Turner, Cornelious Bryantraci R, MD  ondansetron (ZOFRAN ODT) 4 MG disintegrating tablet Take 1 tablet (4 mg total) by mouth every 8 (eight) hours as needed for nausea or vomiting. 01/12/19  Yes Roxan Hockeyobinson, [place]az[place] N, PA-C  potassium chloride SA (KLOR-CON M20) 20 MEQ tablet Take 20 mEq by mouth daily.   Yes [provider]  RASUVO 25 MG/0.5ML SOAJ INJECT ONE PEN SUBCUTANEOUSLY ONCE EVERY WEEK. STORE AT ROOM TEMPERATURE BETWEEN 68 - 77 DEGREES F. Patient taking differently: Inject 25 mg into the skin every 7 (seven) days.  11/03/18  Yes Deveshwar, Janalyn RouseShaili, MD  rosuvastatin (CRESTOR) 20 MG tablet Take 20 mg by mouth. 09/21/16 04/15/19 Yes [provider]  tretinoin (RETIN-A) 0.025 % cream Apply 1 application topically daily as needed (acne).  02/11/15  Yes [provider]  triamcinolone cream (KENALOG) 0.1 % Apply 1 application topically daily as needed (rash). Itching  04/13/15  Yes [provider]  Vitamin D, Ergocalciferol, (DRISDOL) 1.25 MG (50000 UT) CAPS capsule Take  50,000 Units by mouth See admin instructions. Take 1 tablet (50000 units) by mouth every Monday, Wednesday   Yes [provider]  XERESE 5-1 % CREA Apply 1 application topically daily as needed (fever blisters).  12/30/14  Yes [provider]  Folic Acid-Vit B6-Vit B12 2.2-25-1 MG TABS TAKE 2 TABLETS BY MOUTH EVERY DAY Patient not taking: Reported on 04/15/2019 08/14/18   Pollyann Savoyeveshwar, Shaili, MD  metoprolol tartrate (LOPRESSOR) 25 MG tablet Take 1 tablet (25 mg total) by mouth 2 (two) times daily. Patient not taking: Reported on 08/05/2016 06/04/16 09/02/16  Manson PasseyBhagat, Bhavinkumar, PA    Family History Family History  Problem Relation Age of Onset  . Hyperlipidemia Mother   . Depression Mother   . Sarcoidosis Father   . Lung disease Father        Pleural Mesothelioma  . Cancer Father   . Heart attack Paternal Grandmother   . Hypertension Paternal Grandmother   . Hypertension Maternal Grandmother   . Diabetes Maternal Grandmother   . Stroke Neg Hx   . Colon cancer Neg Hx   . Esophageal cancer Neg Hx   . Rectal cancer Neg Hx   . Stomach cancer Neg Hx     Social History Social History   Tobacco Use  . Smoking status: Never Smoker  . Smokeless tobacco: Never Used  Substance Use Topics  . Alcohol use: No  . Drug use: No     Allergies   Influenza vaccines and Isosorbide   Review of Systems Review of Systems  Constitutional: Negative for appetite change.  HENT: Negative for congestion.   Respiratory: Positive for shortness of breath.   Cardiovascular: Positive for chest pain. Negative for leg swelling.  Gastrointestinal: Negative for abdominal pain.  Genitourinary: Negative for dysuria.  Musculoskeletal: Negative for back pain.  Skin: Negative for rash.  Neurological: Negative for weakness.  Psychiatric/Behavioral: Negative for confusion. The patient is nervous/anxious.      Physical Exam Updated Vital Signs BP 126/78 (BP Location: Right Arm)   Pulse 64    Temp 97.6 F (36.4 C) (Oral)   Resp 16   Ht 5\' 2"  (1.575 m)   Wt 116 kg   SpO2 97%   BMI 46.77 kg/m   Physical Exam Vitals signs and nursing note reviewed.  HENT:     Head: Atraumatic.  Cardiovascular:     Rate and Rhythm: Normal rate and regular rhythm.     Heart sounds:  No friction rub.  Pulmonary:     Breath sounds: No decreased breath sounds, wheezing, rhonchi or rales.  Musculoskeletal:     Right lower leg: No edema.     Left lower leg: No edema.  Skin:    General: Skin is warm.     Capillary Refill: Capillary refill takes less than 2 seconds.  Neurological:     General: No focal deficit present.     Mental Status: She is alert.      ED Treatments / Results  Labs (all labs ordered are listed, but only abnormal results are displayed) Labs Reviewed  COMPREHENSIVE METABOLIC PANEL - Abnormal; Notable for the following components:      Result Value   Calcium 8.7 (*)    Total Protein 6.1 (*)    Albumin 3.0 (*)    All other components within normal limits  TROPONIN I (HIGH SENSITIVITY) - Abnormal; Notable for the following components:   Troponin I (High Sensitivity) 27 (*)    All other components within normal limits  CBC WITH DIFFERENTIAL/PLATELET  D-DIMER, QUANTITATIVE (NOT [facility])  TROPONIN I (HIGH SENSITIVITY)    EKG EKG Interpretation  Date/Time:  Sunday April 15 2019 11:47:34 EST Ventricular Rate:  65 PR Interval:    QRS Duration: 101 QT Interval:  411 QTC Calculation: 428 R Axis:   102 Text Interpretation: Sinus rhythm Borderline short PR interval Right axis deviation Low voltage, precordial leads No significant change since last tracing Confirmed by Benjiman CorePickering, Courtenay Hirth [phone]([id number]) on 04/15/2019 11:49:27 AM   Radiology Dg Chest Portable 1 View  Result Date: 04/15/2019 CLINICAL DATA:  Reason for exam: chest pain, SOB EXAM: PORTABLE CHEST 1 VIEW COMPARISON:  Chest radiograph 01/12/2019 FINDINGS: Stable cardiomediastinal contours with enlarged heart  size. The lungs are clear. No pneumothorax or large pleural effusion. The visualized skeletal structures are unremarkable. IMPRESSION: No acute cardiopulmonary finding. Electronically Signed   By: Emmaline KluverNancy  Ballantyne M.D.   On: 04/15/2019 13:16    Procedures Procedures (including critical care time)  Medications Ordered in ED Medications  alum & mag hydroxide-simeth (MAALOX/MYLANTA) 200-200-20 MG/5ML suspension 30 mL (30 mLs Oral Given 04/15/19 1421)    And  lidocaine (XYLOCAINE) 2 % viscous mouth solution 15 mL (15 mLs Oral Given 04/15/19 1421)     Initial Impression / Assessment and Plan / ED Course  I have reviewed the triage vital signs and the nursing notes.  Pertinent labs & imaging results that were available during my care of the patient were reviewed by me and considered in my medical decision making (see chart for details).        Patient presents with chest pain.  Has been going on and off recently but worse today.  Relieved by nitroglycerin with EMS.  EKG reassuring.  First troponin negative.  Negative D-dimer, however second troponin has increased.  Will discuss with cardiology.  Had heart cath 2 years ago that had only a 10% LAD lesion.  Discussed with Dr. Eden EmmsNishan from cardiology.  Thinks patient would benefit from medicine admission.  If troponins continue to elevate or if patient rules in otherwise cardiology can be consulted.  Lab has called back.  Apparently the second troponin was an error and the repeat troponin was not 27 it was 3.  This is unchanged from the first draw.  This makes me worry less about cardiac disease or even pericarditis.  Will discharge home with some steroids.  With her rheumatoid arthritis could be more musculoskeletal.  Will  discharge home with outpatient follow-up.  Final Clinical Impressions(s) / ED Diagnoses   Final diagnoses:  Chest pain, unspecified type  Elevated troponin    ED Discharge Orders    None       Benjiman Core, MD  04/15/19 1532    Benjiman Core, MD 04/15/19 1548    Benjiman Core, MD 04/15/19 1624

## 2019-04-15 NOTE — ED Triage Notes (Signed)
Pt arrives from home by EMS with C/O of chest pain. Pt woke up around 0700 with pressure in her chest. 8/10. Pt states it has been going on for about a week but has resolved itself. Today it has not. Denies SOB EMS gave 324 aspirin 1 Nitro 4mg  zofran Relief of pressure occurred.   141/80 73 Gr 97.6

## 2019-04-16 ENCOUNTER — Telehealth: Payer: Self-pay | Admitting: Cardiology

## 2019-04-16 ENCOUNTER — Encounter: Payer: Self-pay | Admitting: Physician Assistant

## 2019-04-16 ENCOUNTER — Telehealth (INDEPENDENT_AMBULATORY_CARE_PROVIDER_SITE_OTHER): Payer: BC Managed Care – PPO | Admitting: Physician Assistant

## 2019-04-16 VITALS — Ht 62.0 in | Wt 255.0 lb

## 2019-04-16 DIAGNOSIS — R079 Chest pain, unspecified: Secondary | ICD-10-CM | POA: Diagnosis not present

## 2019-04-16 DIAGNOSIS — I2510 Atherosclerotic heart disease of native coronary artery without angina pectoris: Secondary | ICD-10-CM

## 2019-04-16 DIAGNOSIS — I10 Essential (primary) hypertension: Secondary | ICD-10-CM

## 2019-04-16 DIAGNOSIS — R002 Palpitations: Secondary | ICD-10-CM

## 2019-04-16 DIAGNOSIS — Z7189 Other specified counseling: Secondary | ICD-10-CM

## 2019-04-16 MED ORDER — DILTIAZEM HCL ER COATED BEADS 180 MG PO CP24: 180.0000 mg | ORAL_CAPSULE | Freq: Every day | ORAL | 3 refills | Status: DC

## 2019-04-16 NOTE — Progress Notes (Deleted)
Cardiology Office Note   Date:  04/16/2019   ID:  [name] [name], DOB 1966-04-11, MRN [id number]  PCP:  Jonathon Resides, MD  Cardiologist: Dr. Fransico Him, MD   No chief complaint on file.   History of Present Illness: [name] [name] is a 53 y.o. female who presents for ED follow up of chest pain, seen for Dr. Radford Pax.   [name] [name] has a hx of diastolic dysfunction, RA, depression who presents back today for f/u for CAD and chest pain. She initially saw Dr. Radford Pax several years agofor atypical CP and cardiac workup with nuclear stress test and echo were normal with diastolic dysfunction on echo. A coronary CTA showed calcium score in the 95th% but no obstructive ASCAD (30% prox LAD stenosis).  She was then seen in 2018 in follow-up after an ED visit for palpitations and recurrent chest pain.  She wore a cardiac monitor that showed some PACs but no other significant arrhythmias.  She had NST on 03/01/2016 that showed a fixed small, mild apical perfusion defect may be due to attenuation given normal wall motion.  There was also a small, mild reversible basal anterior lateral perfusion defect.  Ischemia could not be ruled out.  Subsequently, Dr. Radford Pax ordered for her to undergo a repeat coronary CTA with morphology.  This was performed 05/04/2016 which demonstrated coronary calcium score of 171 considered 99th percentile for age and sex matched control.  There was normal coronary origin with right dominance.  A diffuse long calcified plaque in the proximal to mid LAD associated with a 25 to 50% stenosis.  A second diagonal branch had severe noncalcified ostial/proximal plaque with associated stenosis of greater than 70%.  There was minimal plaque in the RCA and LCx arteries.  Based on her coronary CTA findings, medical therapy was recommended.  She was started on low-dose Imdur.  Unfortunately, she was noted to be intolerant secondary to headaches and diarrhea.  She self  discontinued Imdur with resolution of symptoms.  On last office visit, she continued to note chest pain and palpitations in which an LHC was pursued.  This was performed on 06/11/2016 which showed nonobstructive CAD with 10% mid LAD lesion.  No aortic stenosis and a normal LVEF.  Continued medical therapy was once again recommended.  She has not been seen by her service since this time.  On 04/15/2019 she was seen in the emergency department for intermittent chest pain with shortness of breath.  She reported a week long history of shortness of breath with chest pain that began on day of presentation rated an 8 out of 10 in severity.  Given her symptoms, EMS was called in which she was given nitroglycerin with improvement.  She had no nausea, vomiting or diaphoresis.   EKG was reassuring.  HST 3>>3 (second troponin initially reported as 27 in error).  D-dimer was negative.  Dr. Johnsie Cancel curb sided who initally felt she could possibly benefit from a medicine admission however after correction of high-sensitivity troponin she was then discharged from the ED to home.   1.  Chest pain with nonobstructive CAD: -Per LHC 06/11/2016 which showed only 10% mid LAD lesion and no other disease.  Continue medical therapy was recommended -    2.  Palpitations: -Previous cardiac monitor showed PACs but no other significant arrhythmias -Metoprolol 25 mg twice daily  3.  HTN: -Stable,   4.  HLD: -LDL followed by PCP -Continue statin therapy  Past Medical History:  Diagnosis Date  . Anxiety   . Coronary artery calcification seen on CAT scan    minimal CAD with 30% prox and mild LAD  . Depression   . Diastolic dysfunction   . Dyslipidemia 01/30/2015  . Esophageal ring   . Fibromyalgia   . Hiatal hernia   . HSV-1 infection   . Morbid obesity (HCC)   . Rheumatoid arthritis(714.0)    M05.79  . Rheumatoid arthritis, seropositive, 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HCC)    Treated with Orencia, TB neg 09/12/2015     Past Surgical History:  Procedure Laterality Date  . CARDIAC CATHETERIZATION N/A 06/11/2016   Procedure: Left Heart Cath and Coronary Angiography;  Surgeon: Corky Crafts, MD;  Location: [facility] INVASIVE CV LAB;  Service: Cardiovascular;  Laterality: N/A;  . CESAREAN SECTION    . COLONOSCOPY  07/27/2017  . ESOPHAGEAL MANOMETRY N/A 10/28/2014   Procedure: ESOPHAGEAL MANOMETRY (EM);  Surgeon: Hilarie Fredrickson, MD;  Location: [facility] ENDOSCOPY;  Service: Endoscopy;  Laterality: N/A;  . HERNIA REPAIR     reports surgery on 3 hernias, with 2 more present  . LAPAROSCOPIC GASTRIC SLEEVE RESECTION  11/21/12   [facility]  . LEFT HEART CATHETERIZATION WITH CORONARY ANGIOGRAM N/A 08/26/2014   Procedure: LEFT HEART CATHETERIZATION WITH CORONARY ANGIOGRAM;  Surgeon: Runell Gess, MD;  Location: [facility] CATH LAB;  Service: Cardiovascular;  Laterality: N/A;  . TUBAL LIGATION       Current Outpatient Medications  Medication Sig Dispense Refill  . Abatacept (ORENCIA IV) Inject 1,000 mg into the vein every 28 (twenty-eight) days.     . Adapalene 0.3 % gel Apply 1 application topically daily as needed (acne).     . ARIPiprazole (ABILIFY) 2 MG tablet Take 1 tablet (2 mg total) by mouth daily. 30 tablet 0  . Betamethasone Valerate 0.12 % foam Apply 1 application topically daily as needed (itching). Itching   7  . buPROPion (WELLBUTRIN XL) 300 MG 24 hr tablet Take 1 tablet (300 mg total) by mouth daily. 30 tablet 0  . cetirizine (ZYRTEC) 10 MG tablet Take 10 mg by mouth daily as needed for allergies.     . clindamycin-benzoyl peroxide (BENZACLIN) gel Apply 1 application topically daily as needed (acne).     . clobetasol cream (TEMOVATE) 0.05 % Apply 1 application topically daily as needed (skin discoloration).   0  . cyclobenzaprine (FLEXERIL) 10 MG tablet Take 1 tablet by mouth daily at bedtime as needed for muscle spasms. 90 tablet 0  . diclofenac sodium (VOLTAREN) 1 % GEL APPLY 3 GRAMS TO 3 LARGE JOINTS UP TO THREE  TIMES A DAY AS NEEDED (Patient taking differently: Apply 2 g topically daily as needed (pain). ) 3 Tube 0  . diclofenac sodium (VOLTAREN) 1 % GEL Apply 2-4 grams to affected joints up to 4 times daily as needed. (Patient taking differently: Apply 2 g topically daily as needed (pain). ) 400 g 4  . famciclovir (FAMVIR) 500 MG tablet Take 15,000 mg by mouth daily as needed (fever blister).   11  . fluticasone (FLONASE) 50 MCG/ACT nasal spray Place 1 spray into both nostrils daily. (Patient taking differently: Place 1 spray into both nostrils daily as needed for allergies. ) 16 g 0  . Folic Acid-Vit B6-Vit B12 (FABB) 2.2-25-1 MG TABS Take 1 tablet by mouth daily. (Patient taking differently: Take 1 tablet by mouth 2 (two) times daily. ) 90 each 3  . Folic Acid-Vit B6-Vit B12 2.2-25-1  MG TABS TAKE 2 TABLETS BY MOUTH EVERY DAY (Patient not taking: Reported on 04/15/2019) 180 each 1  . gabapentin (NEURONTIN) 100 MG capsule TAKE 1 CAPSULE BY MOUTH EVERY DAY (Patient taking differently: Take 100 mg by mouth at bedtime. TAKE 1 CAPSULE BY MOUTH EVERY DAY) 90 capsule 0  . hydrOXYzine (VISTARIL) 25 MG capsule Take 25 mg by mouth every 8 (eight) hours as needed for anxiety.     . lidocaine (LIDODERM) 5 % PLACE 1 PATCH ONTO THE SKIN DAILY. REMOVE & DISCARD PATCH WITHIN 12 HOURS OR AS DIRECTED BY MD (Patient taking differently: Place 1 patch onto the skin daily as needed (pain). ) 30 patch 0  . metoprolol tartrate (LOPRESSOR) 25 MG tablet Take 1 tablet (25 mg total) by mouth 2 (two) times daily. (Patient not taking: Reported on 08/05/2016) 180 tablet 3  . nitroGLYCERIN (NITROSTAT) 0.4 MG SL tablet PLACE 1 TABLET UNDER THE TONGUE EVERY 5 MINUTES AS NEEDED FOR CHEST PAIN. (Patient taking differently: Place 0.4 mg under the tongue every 5 (five) minutes as needed for chest pain. As emergency guide) 25 tablet 3  . ondansetron (ZOFRAN ODT) 4 MG disintegrating tablet Take 1 tablet (4 mg total) by mouth every 8 (eight) hours as  needed for nausea or vomiting. 20 tablet 0  . potassium chloride SA (KLOR-CON M20) 20 MEQ tablet Take 20 mEq by mouth daily.    . predniSONE (DELTASONE) 20 MG tablet Take 2 tablets (40 mg total) by mouth daily. 8 tablet 0  . RASUVO 25 MG/0.5ML SOAJ INJECT ONE PEN SUBCUTANEOUSLY ONCE EVERY WEEK. STORE AT ROOM TEMPERATURE BETWEEN 68 - 77 DEGREES F. (Patient taking differently: Inject 25 mg into the skin every 7 (seven) days. ) 12 pen 0  . rosuvastatin (CRESTOR) 20 MG tablet Take 20 mg by mouth.    . tretinoin (RETIN-A) 0.025 % cream Apply 1 application topically daily as needed (acne).     . triamcinolone cream (KENALOG) 0.1 % Apply 1 application topically daily as needed (rash). Itching   0  . Vitamin D, Ergocalciferol, (DRISDOL) 1.25 MG (50000 UT) CAPS capsule Take 50,000 Units by mouth See admin instructions. Take 1 tablet (50000 units) by mouth every Monday, Wednesday    . XERESE 5-1 % CREA Apply 1 application topically daily as needed (fever blisters).      No current facility-administered medications for this visit.     Allergies:   Influenza vaccines and Isosorbide    Social History:  The patient  reports that she has never smoked. She has never used smokeless tobacco. She reports that she does not drink alcohol or use drugs.   Family History:  The patient's ***family history includes Cancer in her father; Depression in her mother; Diabetes in her maternal grandmother; Heart attack in her paternal grandmother; Hyperlipidemia in her mother; Hypertension in her maternal grandmother and paternal grandmother; Lung disease in her father; Sarcoidosis in her father.    ROS:  Please see the history of present illness.   Otherwise, review of systems are positive for {NONE DEFAULTED:18576::"none"}.   All other systems are reviewed and negative.    PHYSICAL EXAM: VS:  There were no vitals taken for this visit. , BMI There is no height or weight on file to calculate BMI. GEN: Well nourished, well  developed, in no acute distress HEENT: normal Neck: no JVD, carotid bruits, or masses Cardiac: ***RRR; no murmurs, rubs, or gallops,no edema  Respiratory:  clear to auscultation bilaterally, normal work of  breathing GI: soft, nontender, nondistended, + BS MS: no deformity or atrophy Skin: warm and dry, no rash Neuro:  Strength and sensation are intact Psych: euthymic mood, full affect   EKG:  EKG {ACTION; IS/IS AQT:62263335} ordered today. The ekg ordered today demonstrates ***   Recent Labs: 04/15/2019: ALT 14; BUN 12; Creatinine, Ser 0.89; Hemoglobin 12.8; Platelets 257; Potassium 3.9; Sodium 143    Lipid Panel    Component Value Date/Time   CHOL 152 02/06/2015 1055   TRIG 82.0 02/06/2015 1055   HDL 55.50 02/06/2015 1055   CHOLHDL 3 02/06/2015 1055   VLDL 16.4 02/06/2015 1055   LDLCALC 80 02/06/2015 1055      Wt Readings from Last 3 Encounters:  04/15/19 255 lb 11.7 oz (116 kg)  04/05/19 257 lb (116.6 kg)  03/08/19 250 lb (113.4 kg)      Other studies Reviewed: Additional studies/ records that were reviewed today include: ***. LHC 06/11/2016:   The left ventricular systolic function is normal.  LV end diastolic pressure is normal.  The left ventricular ejection fraction is 55-65% by visual estimate.  There is no aortic valve stenosis.  Mid LAD lesion, 10 %stenosed.  Separate ostia of the LAD and circumflex.   Continue preventive medical therapy.  ASSESSMENT AND PLAN:  1.  ***   Current medicines are reviewed at length with the patient today.  The patient {ACTIONS; HAS/DOES NOT HAVE:19233} concerns regarding medicines.  The following changes have been made:  {PLAN; NO CHANGE:13088:s}  Labs/ tests ordered today include: *** No orders of the defined types were placed in this encounter.    Disposition:   FU with *** in {gen number 4-56:256389} {Days to years:10300}  Signed, Georgie Chard, NP  04/16/2019 9:53 AM    [facility]  [facility] [address] [place], [place], [place]  [address] Phone: [phone]; Fax: [phone]

## 2019-04-16 NOTE — Addendum Note (Signed)
Addended by: Gaetano Net on: 04/16/2019 01:35 PM   Modules accepted: Orders

## 2019-04-16 NOTE — Telephone Encounter (Signed)
Did not need this encounter at this time 

## 2019-04-16 NOTE — Addendum Note (Signed)
Addended by: Gaetano Net on: 04/16/2019 01:38 PM   Modules accepted: Orders

## 2019-04-16 NOTE — Telephone Encounter (Signed)
Patient reports on and off chest pain for the past week. She went to the ER yesterday 11/08 and was given nitroglycerin. They prescribed her prednisone. Today she reports that her chest pain has eased off. Scheduled her for a virtual visit with Robbie Lis, PA-C for this afternoon and patient agreed.

## 2019-04-16 NOTE — Telephone Encounter (Signed)
Pt c/o of Chest Pain: STAT if CP now or developed within 24 hours   1. Are you having CP right now?  yes  2. Are you experiencing any other symptoms (ex. SOB, nausea, vomiting, sweating)? No  3. How long have you been experiencing CP?  Started a week ago  And she went to [facility] ER yesterday  4. Is your CP continuous or coming and going? Coming and Going  5. Have you taken Nitroglycerin? Not today, but had some yesterday- I made pt an appt for Thursday  With Kathyrn Drown ?

## 2019-04-16 NOTE — Progress Notes (Signed)
Virtual Visit via Telephone Note   This visit type was conducted due to national recommendations for restrictions regarding the COVID-19 Pandemic (e.g. social distancing) in an effort to limit this patient's exposure and mitigate transmission in our community.  Due to her co-morbid illnesses, this patient is at least at moderate risk for complications without adequate follow up.  This format is felt to be most appropriate for this patient at this time.  The patient did not have access to video technology/had technical difficulties with video requiring transitioning to audio format only (telephone).  All issues noted in this document were discussed and addressed.  No physical exam could be performed with this format.  Please refer to the patient's chart for her  consent to telehealth for [facility].   Date:  04/16/2019   ID:  [name] [name], DOB 03-21-1966, MRN [id number]  Patient Location: Home Provider Location: Other:  Hospital [facility])  PCP:  [name], MD  Cardiologist: Dr. Mayford Knife   Evaluation Performed:  Follow-Up Visit  Chief Complaint:  Chest pain   History of Present Illness:    [name] [name] is a 53 y.o. female with mild non obstructive CAD with recurrent non specific chest pain, HLD, chronic diastolic CHF, RA and depression seen for chest pain evaluation.   She had cardiac workup with nuclear stress test and echo were normal with diastolic dysfunction and elevated filling pressures on echo in 2014. A coronary CTA in 2015 showed calcium score in the 95th% but no obstructive ASCAD (30% prox LAD stenosis).  NST on 03/01/16 was low risk study.   Dr. Mayford Knife ordered for her to undergo a repeat coronary CTA with morphology. This was performed 05/04/16 and demonstrated a coronary calcium score of 171. All in proximal to mid LAD. This was 63 percentile for age and sex matched control. Normal coronary origin with right dominance. Diffuse long calcified plaque in  proximal to mid LAD associated with 25-50% stenosis. Second diagonal branch has severe noncalcified ostial/proximal plaque with associated stenosis >70%. Minimal plaque in RCA and LCX arteries. Mildly dilated pulmonary artery measuring 31 mm suggestive of pulmonary hypertension.  Based on her coronary CTA findings, medical therapy was recommended. Low dose Imdur, 15 mg, was added. Unfortunately, she was unable to tolerate Imdur due to severe headaches and diarrhea. However due to going symptoms, ultimately she underwent LHC on 06/11/2016. This showed nonobstructive CAD with 10% mid LAD lesion. No aortic stenosis. LVEF was normal (55-65%) as well as normal LVEDP. Continued medical therapy was recommended.   Last seen by APP 07/01/2016.  She was seen in ER yesterday 04/15/2019 for chest pain. Troponin was negative x 3. EKG without acute abnormality. She was discharged from ER. Felt ? Pericarditis vs MSK. Started on short course of prednisone.    Seen virtually for follow-up.  She reported intermittent chest pain with and without palpitation for the past 2 1/2 months since ran out of Cardizem.  Progressively worsened especially in past 1 month.  She started prednisone yesterday and feeling better this morning.  She is on her way to [facility] for family emergency.  Patient reports chest pain worsened with laying down or bending.  No cough, congestion, fever, chills or sick contact.  She has chronic chest pain due to rheumatoid arthritis.  She may have associated shortness of breath but unable to tell.  She drinks multiple coffee/tea per day.    The patient does not have symptoms concerning for COVID-19 infection (fever,  chills, cough, or new shortness of breath).    Past Medical History:  Diagnosis Date  . Anxiety   . Coronary artery calcification seen on CAT scan    minimal CAD with 30% prox and mild LAD  . Depression   . Diastolic dysfunction   . Dyslipidemia 01/30/2015  . Esophageal  ring   . Fibromyalgia   . Hiatal hernia   . HSV-1 infection   . Morbid obesity ([place])   . Rheumatoid arthritis(714.0)    M05.79  . Rheumatoid arthritis, seropositive, 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[place])    Treated with Orencia, TB neg 09/12/2015   Past Surgical History:  Procedure Laterality Date  . CARDIAC CATHETERIZATION N/A 06/11/2016   Procedure: Left Heart Cath and Coronary Angiography;  Surgeon: Jettie Booze, MD;  Location: [place] CV LAB;  Service: Cardiovascular;  Laterality: N/A;  . CESAREAN SECTION    . COLONOSCOPY  07/27/2017  . ESOPHAGEAL MANOMETRY N/A 10/28/2014   Procedure: ESOPHAGEAL MANOMETRY (EM);  Surgeon: Irene Shipper, MD;  Location: [facility] ENDOSCOPY;  Service: Endoscopy;  Laterality: N/A;  . HERNIA REPAIR     reports surgery on 3 hernias, with 2 more present  . LAPAROSCOPIC GASTRIC SLEEVE RESECTION  11/21/12   [facility]  . LEFT HEART CATHETERIZATION WITH CORONARY ANGIOGRAM N/A 08/26/2014   Procedure: LEFT HEART CATHETERIZATION WITH CORONARY ANGIOGRAM;  Surgeon: Lorretta Harp, MD;  Location: [facility] CATH LAB;  Service: Cardiovascular;  Laterality: N/A;  . TUBAL LIGATION       Current Meds  Medication Sig  . Abatacept (ORENCIA IV) Inject 1,000 mg into the vein every 28 (twenty-eight) days.   . Adapalene 0.3 % gel Apply 1 application topically daily as needed (acne).   . ARIPiprazole (ABILIFY) 2 MG tablet Take 1 tablet (2 mg total) by mouth daily.  . Betamethasone Valerate 0.12 % foam Apply 1 application topically daily as needed (itching). Itching   . buPROPion (WELLBUTRIN XL) 300 MG 24 hr tablet Take 1 tablet (300 mg total) by mouth daily.  . cetirizine (ZYRTEC) 10 MG tablet Take 10 mg by mouth daily as needed for allergies.   . clindamycin-benzoyl peroxide (BENZACLIN) gel Apply 1 application topically daily as needed (acne).   . clobetasol cream (TEMOVATE) 6.64 % Apply 1 application topically daily as needed (skin discoloration).   . cyclobenzaprine (FLEXERIL) 10 MG tablet  Take 1 tablet by mouth daily at bedtime as needed for muscle spasms.  . diclofenac sodium (VOLTAREN) 1 % GEL APPLY 3 GRAMS TO 3 LARGE JOINTS UP TO THREE TIMES A DAY AS NEEDED (Patient taking differently: Apply 2 g topically daily as needed (pain). )  . famciclovir (FAMVIR) 500 MG tablet Take 15,000 mg by mouth daily as needed (fever blister).   . fluticasone (FLONASE) 50 MCG/ACT nasal spray Place 1 spray into both nostrils daily. (Patient taking differently: Place 1 spray into both nostrils daily as needed for allergies. )  . Folic Acid-Vit Q0-HKV Q25 (FABB) 2.2-25-1 MG TABS Take 1 tablet by mouth daily. (Patient taking differently: Take 1 tablet by mouth 2 (two) times daily. )  . gabapentin (NEURONTIN) 100 MG capsule TAKE 1 CAPSULE BY MOUTH EVERY DAY (Patient taking differently: Take 300 mg by mouth at bedtime. TAKE 1 CAPSULE BY MOUTH EVERY DAY)  . hydrOXYzine (VISTARIL) 25 MG capsule Take 25 mg by mouth every 8 (eight) hours as needed for anxiety.   . lidocaine (LIDODERM) 5 % PLACE 1 PATCH ONTO THE SKIN DAILY. REMOVE & DISCARD [facility]  WITHIN 12 HOURS OR AS DIRECTED BY MD (Patient taking differently: Place 1 patch onto the skin daily as needed (pain). )  . nitroGLYCERIN (NITROSTAT) 0.4 MG SL tablet PLACE 1 TABLET UNDER THE TONGUE EVERY 5 MINUTES AS NEEDED FOR CHEST PAIN. (Patient taking differently: Place 0.4 mg under the tongue every 5 (five) minutes as needed for chest pain. As emergency guide)  . ondansetron (ZOFRAN ODT) 4 MG disintegrating tablet Take 1 tablet (4 mg total) by mouth every 8 (eight) hours as needed for nausea or vomiting.  . potassium chloride SA (KLOR-CON M20) 20 MEQ tablet Take 20 mEq by mouth daily.  . predniSONE (DELTASONE) 20 MG tablet Take 2 tablets (40 mg total) by mouth daily.  Marland Kitchen. RASUVO 25 MG/0.5ML SOAJ INJECT ONE PEN SUBCUTANEOUSLY ONCE EVERY WEEK. STORE AT ROOM TEMPERATURE BETWEEN 68 - 77 DEGREES F. (Patient taking differently: Inject 25 mg into the skin every 7 (seven) days.  )  . rosuvastatin (CRESTOR) 20 MG tablet Take 20 mg by mouth.  . tretinoin (RETIN-A) 0.025 % cream Apply 1 application topically daily as needed (acne).   . triamcinolone cream (KENALOG) 0.1 % Apply 1 application topically daily as needed (rash). Itching   . Vitamin D, Ergocalciferol, (DRISDOL) 1.25 MG (50000 UT) CAPS capsule Take 50,000 Units by mouth See admin instructions. Take 1 tablet (50000 units) by mouth every Monday, Wednesday, Saturday  . XERESE 5-1 % CREA Apply 1 application topically daily as needed (fever blisters).   . [DISCONTINUED] diclofenac sodium (VOLTAREN) 1 % GEL Apply 2-4 grams to affected joints up to 4 times daily as needed. (Patient taking differently: Apply 2 g topically daily as needed (pain). )     Allergies:   Influenza vaccines and Isosorbide   Social History   Tobacco Use  . Smoking status: Never Smoker  . Smokeless tobacco: Never Used  Substance Use Topics  . Alcohol use: No  . Drug use: No     Family Hx: The patient's family history includes Cancer in her father; Depression in her mother; Diabetes in her maternal grandmother; Heart attack in her paternal grandmother; Hyperlipidemia in her mother; Hypertension in her maternal grandmother and paternal grandmother; Lung disease in her father; Sarcoidosis in her father. There is no history of Stroke, Colon cancer, Esophageal cancer, Rectal cancer, or Stomach cancer.  ROS:   Please see the history of present illness.    All other systems reviewed and are negative.   Prior CV studies:   The following studies were reviewed today:  As summarized above  Labs/Other Tests and Data Reviewed:    EKG:  An ECG dated 04/15/2019 was personally reviewed today and demonstrated:  SR at rate of 88 bpm, no acute ischemic changes  Recent Labs: 04/15/2019: ALT 14; BUN 12; Creatinine, Ser 0.89; Hemoglobin 12.8; Platelets 257; Potassium 3.9; Sodium 143   Recent Lipid Panel Lab Results  Component Value Date/Time   CHOL  152 02/06/2015 10:55 AM   TRIG 82.0 02/06/2015 10:55 AM   HDL 55.50 02/06/2015 10:55 AM   CHOLHDL 3 02/06/2015 10:55 AM   LDLCALC 80 02/06/2015 10:55 AM    Wt Readings from Last 3 Encounters:  04/16/19 255 lb (115.7 kg)  04/15/19 255 lb 11.7 oz (116 kg)  04/05/19 257 lb (116.6 kg)     Objective:    Vital Signs:  Ht 5\' 2"  (1.575 m)   Wt 255 lb (115.7 kg)   BMI 46.64 kg/m    VITAL SIGNS:  reviewed GEN:  no acute distress PSYCH:  normal affect  ASSESSMENT & PLAN:    1. Chest pain 2. Palpitation 3. Questionable pericarditis/musculoskeletal 4. Mild non obstructive CAD 5. HTN  Patient has multiple prior cardiac work up as summarized above.  Last cath January 2018 with 10% LAD stenosis.  Her symptoms started after she ran out of her Cardizem few months ago, progressively worse.  Her symptom does not consistent with angina however could be due to palpitation.  Yesterday she was treated with prednisone for possible pericarditis.  She does have worsening symptoms with laying down and bending but no other symptoms or etiology to explain her symptoms.  I have advised her to finish prednisone course.  If she has a recurrent pain she will start over-the-counter ibuprofen 600 mg twice daily and let us know (may consider short term colchicine).  Will restart Cardizem CD 180 mg daily. Advised complete cessation of caffeinated product.  She is agree with plan.   COVID-19 Education: The signs and symptoms of COVID-19 were discussed with the patient and how to seek care for testing (follow up with PCP or arrange E-visit).  The importance of social distancing was discussed today.  Time:   Today, I have spent 16 minutes with the patient with telehealth technology discussing the above problems.     Medication Adjustments/Labs and Tests Ordered: Current medicines are reviewed at length with the patient today.  Concerns regarding medicines are outlined above.   Tests Ordered: No orders of the  defined types were placed in this encounter.   Medication Changes: No orders of the defined types were placed in this encounter.   Follow Up:  Virtual Visit  in 2 week(s)  Signed, Manson Passey, PA  04/16/2019 1:22 PM    [facility]

## 2019-04-16 NOTE — Patient Instructions (Signed)
Medication Instructions:  Your physician has recommended you make the following change in your medication:  1.  START Cardizem 180 mg taking 1 tablet daily  *If you need a refill on your cardiac medications before your next appointment, please call your pharmacy*  Lab Work: None ordered  If you have labs (blood work) drawn today and your tests are completely normal, you will receive your results only by: Marland Kitchen MyChart Message (if you have MyChart) OR . A paper copy in the mail If you have any lab test that is abnormal or we need to change your treatment, we will call you to review the results.  Testing/Procedures: None ordered   Follow-Up: At [facility], you and your health needs are our priority.  As part of our continuing mission to provide you with exceptional heart care, we have created designated Provider Care Teams.  These Care Teams include your primary Cardiologist (physician) and Advanced Practice Providers (APPs -  Physician Assistants and Nurse Practitioners) who all work together to provide you with the care you need, when you need it.  Your next appointment:   04/30/2019 agt 11:30  The format for your next appointment:   Virtual Visit   Provider:   Robbie Lis, PA-C  Other Instructions Take over the counter Ibuprofen 600 mg twice a day for recurrent chest pain

## 2019-04-18 ENCOUNTER — Ambulatory Visit: Payer: BC Managed Care – PPO | Admitting: Rheumatology

## 2019-04-19 ENCOUNTER — Ambulatory Visit: Payer: BC Managed Care – PPO | Admitting: Cardiology

## 2019-04-23 ENCOUNTER — Telehealth: Payer: Self-pay

## 2019-04-23 NOTE — Telephone Encounter (Signed)
I spoke to the patient and even after completing prednisone course and starting Ibuprofen 600 mg bid, the patient continues to have chest "discomfort".  She feels it more when laying down or bending over.  She has no NTG on board and was wondering if she needed to have some prescribed.  She has a Hospital doctor Visit on 11/23 with Vin.  I told her that if symptoms worsen or become more frequent, she needs to go to the ED.  She verbalized understanding.

## 2019-04-23 NOTE — Telephone Encounter (Signed)
I called [name] [name] to request her Telemedicine visit for 04/30/2019 be rescheduled from 11:30am to 10:30am and she agreed to this appt time change. While on the phone, [name] [name] expressed that she was about to call us regarding her health. She went on to share that she finished her Prednisone last Friday, 04/20/2019, and remembered Vin asking her to let us know if her chest pain continued. It has (see below).  According to Vin's Telemedicine note from 04/16/2019:  I have advised her to finish prednisone course.  If she has a recurrent pain she will start over-the-counter ibuprofen 600 mg twice daily and let us know (may consider short term colchicine).  Will restart Cardizem CD 180 mg daily. Advised complete cessation of caffeinated product.  She is agree with plan.  Pt is aware that her visit for next Monday is currently virtual and we will wait to see if Vin feels she needs something different for follow up.   Pt c/o of Chest Pain: STAT if CP now or developed within 24 hours  1. Are you having CP right now? No  2. Are you experiencing any other symptoms (ex. SOB, nausea, vomiting, sweating)? No  3. How long have you been experiencing CP? It subsided some with Prednisone last week but after she finished her prednisone she noticed it more frequently when laying down or bending over.  4. Is your CP continuous or coming and going? Coming and going.  5. Have you taken Nitroglycerin? No. Patient doesn't have anymore Nitro on hand but is wondering if she should. Her last prescription expired.

## 2019-04-24 NOTE — Telephone Encounter (Signed)
Per Vin-please schedule Pt for virtual visit 11/18.  Rescheduled Pt for virtual visit 04/25/19 at 10:00 am.  Pt aware and agreeable.

## 2019-04-24 NOTE — Progress Notes (Signed)
Virtual Visit via Video Note   This visit type was conducted due to national recommendations for restrictions regarding the COVID-19 Pandemic (e.g. social distancing) in an effort to limit this patient's exposure and mitigate transmission in our community.  Due to her co-morbid illnesses, this patient is at least at moderate risk for complications without adequate follow up.  This format is felt to be most appropriate for this patient at this time.  All issues noted in this document were discussed and addressed.  A limited physical exam was performed with this format.  Please refer to the patient's chart for her consent to telehealth for [facility] [address]   Date:  04/25/2019   ID:  [name] [name], DOB 1965/07/27, MRN [id number]  Patient Location: Home Provider Location: Home  PCP:  [name], MD  Cardiologist:  Dr. Mayford Knife  Evaluation Performed:  Follow-Up Visit  Chief Complaint:  10 days follow up   History of Present Illness:    [name] [name] is a 53 y.o. female with hx of mild non obstructive CAD with recurrent non specific chest pain, HLD, chronic diastolic CHF, RA and depression seen for follow up.   She had cardiac workup with nuclear stress test and echo were normal with diastolic dysfunction and elevated filling pressures on echo in 2014. A coronary CTA in 2015 showed calcium score in the 95th% but no obstructive ASCAD (30% prox LAD stenosis).  NST on 03/01/16 was low risk study.   Dr. Mayford Knife ordered for her to undergo a repeat coronary CTA with morphology. This was performed 05/04/16 and demonstrated a coronary calcium score of 171. All in proximal to mid LAD. This was 68 percentile for age and sex matched control. Normal coronary origin with right dominance. Diffuse long calcified plaque in proximal to mid LAD associated with 25-50% stenosis. Second diagonal branch has severe noncalcified ostial/proximal plaque with associated stenosis >70%. Minimal plaque in  RCA and LCX arteries. Mildly dilated pulmonary artery measuring 31 mm suggestive of pulmonary hypertension.  Based on her coronary CTA findings, medical therapy was recommended. Low dose Imdur, 15 mg, was added. Unfortunately, she was unable to tolerate Imdur due to severe headaches and diarrhea. However due to going symptoms, ultimately she underwent LHC on 06/11/2016. This showed nonobstructive CAD with 10% mid LAD lesion. No aortic stenosis. LVEF was normal (55-65%) as well as normal LVEDP. Continued medical therapy was recommended.   Seen in  ER 04/15/2019 for chest pain. Troponin was negative x 3. EKG without acute abnormality. She was discharged from ER. Felt ? Pericarditis vs MSK. Started on short course of prednisone.   Seen by me 04/16/2019 virtually for follow-up.  She reported intermittent chest pain with and without palpitation for the past 2 1/2 months since ran out of Cardizem.  Patient reports chest pain worsened with laying down or bending.  advised to finished up prednisone course and take OTC ibuprofen. Restarted Cardizem. Advised complete cessation of caffeinated product.  Seen today for follow up.  The patient continues to have "right sided pain".  Unable to describe.  "Worse with walking, bending and may be laying down".  No associated shortness of breath, dizziness or palpitation.  She has intermittent "fluttering sensation while laying down" which has improved after restarting Cardizem and cutting back caffeine intake.  She denies fever, chills, cough, congestion, shortness of breath, orthopnea, PND, syncope, lower extremity edema or melena.  No sick contact.  Yesterday patient had worsening pain after drinking lemonade.  This  pain is different from her typical fibromyalgia/rheumatoid arthritis pain.      The patient does not have symptoms concerning for COVID-19 infection (fever, chills, cough, or new shortness of breath).    Past Medical History:  Diagnosis Date  .  Anxiety   . Coronary artery calcification seen on CAT scan    minimal CAD with 30% prox and mild LAD  . Depression   . Diastolic dysfunction   . Dyslipidemia 01/30/2015  . Esophageal ring   . Fibromyalgia   . Hiatal hernia   . HSV-1 infection   . Morbid obesity ([place])   . Rheumatoid arthritis(714.0)    M05.79  . Rheumatoid arthritis, seropositive, 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[place])    Treated with Orencia, TB neg 09/12/2015   Past Surgical History:  Procedure Laterality Date  . CARDIAC CATHETERIZATION N/A 06/11/2016   Procedure: Left Heart Cath and Coronary Angiography;  Surgeon: Jettie Booze, MD;  Location: [facility];  Service: Cardiovascular;  Laterality: N/A;  . CESAREAN SECTION    . COLONOSCOPY  07/27/2017  . ESOPHAGEAL MANOMETRY N/A 10/28/2014   Procedure: ESOPHAGEAL MANOMETRY (EM);  Surgeon: Irene Shipper, MD;  Location: [facility];  Service: Endoscopy;  Laterality: N/A;  . HERNIA REPAIR     reports surgery on 3 hernias, with 2 more present  . LAPAROSCOPIC GASTRIC SLEEVE RESECTION  11/21/12   [facility]  . LEFT HEART CATHETERIZATION WITH CORONARY ANGIOGRAM N/A 08/26/2014   Procedure: LEFT HEART CATHETERIZATION WITH CORONARY ANGIOGRAM;  Surgeon: Lorretta Harp, MD;  Location: [facility] CATH LAB;  Service: Cardiovascular;  Laterality: N/A;  . TUBAL LIGATION       Current Meds  Medication Sig  . Abatacept (ORENCIA IV) Inject 1,000 mg into the vein every 28 (twenty-eight) days.   . Adapalene 0.3 % gel Apply 1 application topically daily as needed (acne).   . ARIPiprazole (ABILIFY) 2 MG tablet Take 1 tablet (2 mg total) by mouth daily.  . Betamethasone Valerate 0.12 % foam Apply 1 application topically daily as needed (itching). Itching   . buPROPion (WELLBUTRIN XL) 300 MG 24 hr tablet Take 1 tablet (300 mg total) by mouth daily.  . cetirizine (ZYRTEC) 10 MG tablet Take 10 mg by mouth daily as needed for allergies.   . clindamycin-benzoyl peroxide (BENZACLIN) gel Apply 1 application  topically daily as needed (acne).   . clobetasol cream (TEMOVATE) 3.32 % Apply 1 application topically daily as needed (skin discoloration).   . cyclobenzaprine (FLEXERIL) 10 MG tablet Take 1 tablet by mouth daily at bedtime as needed for muscle spasms.  . diclofenac sodium (VOLTAREN) 1 % GEL APPLY 3 GRAMS TO 3 LARGE JOINTS UP TO THREE TIMES A DAY AS NEEDED (Patient taking differently: Apply 2 g topically daily as needed (pain). )  . diltiazem (CARDIZEM CD) 180 MG 24 hr capsule Take 1 capsule (180 mg total) by mouth daily.  . famciclovir (FAMVIR) 500 MG tablet Take 15,000 mg by mouth daily as needed (fever blister).   . fluticasone (FLONASE) 50 MCG/ACT nasal spray Place 1 spray into both nostrils daily. (Patient taking differently: Place 1 spray into both nostrils daily as needed for allergies. )  . Folic Acid-Vit R5-JOA C16 (FABB) 2.2-25-1 MG TABS Take 1 tablet by mouth daily. (Patient taking differently: Take 1 tablet by mouth 2 (two) times daily. )  . gabapentin (NEURONTIN) 100 MG capsule TAKE 1 CAPSULE BY MOUTH EVERY DAY (Patient taking differently: Take 300 mg by mouth at bedtime. TAKE  1 CAPSULE BY MOUTH EVERY DAY)  . hydrOXYzine (VISTARIL) 25 MG capsule Take 25 mg by mouth every 8 (eight) hours as needed for anxiety.   . lidocaine (LIDODERM) 5 % PLACE 1 PATCH ONTO THE SKIN DAILY. REMOVE & DISCARD PATCH WITHIN 12 HOURS OR AS DIRECTED BY MD (Patient taking differently: Place 1 patch onto the skin daily as needed (pain). )  . nitroGLYCERIN (NITROSTAT) 0.4 MG SL tablet PLACE 1 TABLET UNDER THE TONGUE EVERY 5 MINUTES AS NEEDED FOR CHEST PAIN. (Patient taking differently: Place 0.4 mg under the tongue every 5 (five) minutes as needed for chest pain. As emergency guide)  . ondansetron (ZOFRAN ODT) 4 MG disintegrating tablet Take 1 tablet (4 mg total) by mouth every 8 (eight) hours as needed for nausea or vomiting.  . potassium chloride SA (KLOR-CON M20) 20 MEQ tablet Take 20 mEq by mouth daily.  Marland Kitchen  RASUVO 25 MG/0.5ML SOAJ INJECT ONE PEN SUBCUTANEOUSLY ONCE EVERY WEEK. STORE AT ROOM TEMPERATURE BETWEEN 68 - 77 DEGREES F. (Patient taking differently: Inject 25 mg into the skin every 7 (seven) days. )  . rosuvastatin (CRESTOR) 20 MG tablet Take 20 mg by mouth.  . tretinoin (RETIN-A) 0.025 % cream Apply 1 application topically daily as needed (acne).   . triamcinolone cream (KENALOG) 0.1 % Apply 1 application topically daily as needed (rash). Itching   . Vitamin D, Ergocalciferol, (DRISDOL) 1.25 MG (50000 UT) CAPS capsule Take 50,000 Units by mouth See admin instructions. Take 1 tablet (50000 units) by mouth every Monday, Wednesday, Saturday  . XERESE 5-1 % CREA Apply 1 application topically daily as needed (fever blisters).      Allergies:   Influenza vaccines and Isosorbide   Social History   Tobacco Use  . Smoking status: Never Smoker  . Smokeless tobacco: Never Used  Substance Use Topics  . Alcohol use: No  . Drug use: No     Family Hx: The patient's family history includes Cancer in her father; Depression in her mother; Diabetes in her maternal grandmother; Heart attack in her paternal grandmother; Hyperlipidemia in her mother; Hypertension in her maternal grandmother and paternal grandmother; Lung disease in her father; Sarcoidosis in her father. There is no history of Stroke, Colon cancer, Esophageal cancer, Rectal cancer, or Stomach cancer.  ROS:   Please see the history of present illness.    All other systems reviewed and are negative.   Prior CV studies:   The following studies were reviewed today:  As summarized above  Labs/Other Tests and Data Reviewed:    EKG:    Recent Labs: 04/15/2019: ALT 14; BUN 12; Creatinine, Ser 0.89; Hemoglobin 12.8; Platelets 257; Potassium 3.9; Sodium 143   Recent Lipid Panel Lab Results  Component Value Date/Time   CHOL 152 02/06/2015 10:55 AM   TRIG 82.0 02/06/2015 10:55 AM   HDL 55.50 02/06/2015 10:55 AM   CHOLHDL 3 02/06/2015  10:55 AM   LDLCALC 80 02/06/2015 10:55 AM    Wt Readings from Last 3 Encounters:  04/25/19 258 lb (117 kg)  04/16/19 255 lb (115.7 kg)  04/15/19 255 lb 11.7 oz (116 kg)     Objective:    Vital Signs:  BP 138/90   Pulse 84   Ht 5\' 2"  (1.575 m)   Wt 258 lb (117 kg)   BMI 47.19 kg/m    VITAL SIGNS:  reviewed GEN:  no acute distress EYES:  sclerae anicteric, EOMI - Extraocular Movements Intact RESPIRATORY:  normal respiratory effort,  symmetric expansion CARDIOVASCULAR:  no peripheral edema SKIN:  no rash, lesions or ulcers. MUSCULOSKELETAL:  no obvious deformities. NEURO:  alert and oriented x 3, no obvious focal deficit PSYCH:  normal affect  ASSESSMENT & PLAN:    1. Presumed pericarditis/chest pain Very difficult to differentiate her pain etiology.  She has worsened pain after drinking lemonade yesterday.  Her pain is different than typical fibromyalgia/RA pain.  No improvement despite treatment with prednisone and as needed ibuprofen.  She denies fever, chills, cough, congestion or any sick contact.  Her pain has some pleuritic component as it gets worse with bending and may be laying down.  I have asked her to take over-the-counter Nexium for possible GERD.  I also treat her with colchicine 0.6 mg twice daily for 2 weeks as a prophylactic for presumed pericarditis.  Check sed rate and CRP for inflammatory marker.  If elevated or depending on evaluation during next office visit will consider echocardiogram.  2.  Hypertension -Blood pressure stable  COVID-19 Education: The signs and symptoms of COVID-19 were discussed with the patient and how to seek care for testing (follow up with PCP or arrange E-visit). The importance of social distancing was discussed today.  Time:   Today, I have spent 17 minutes with the patient with telehealth technology discussing the above problems.     Medication Adjustments/Labs and Tests Ordered: Current medicines are reviewed at length with  the patient today.  Concerns regarding medicines are outlined above.   Tests Ordered: Orders Placed This Encounter  Procedures  . Sedimentation rate  . C-reactive protein    Medication Changes: Meds ordered this encounter  Medications  . colchicine 0.6 MG tablet    Sig: Take one tablet by mouth twice daily for 2 weeks    Dispense:  28 tablet    Refill:  0  . esomeprazole (NEXIUM) 20 MG capsule    Sig: Take one tablet by mouth daily for 2 weeks.    Dispense:  14 capsule    Refill:  0    Follow Up:  In Person in 2 week(s)  Signed, Manson PasseyBhavinkumar Sherral Dirocco, [place]  04/25/2019 11:14 AM    [facility]

## 2019-04-25 ENCOUNTER — Telehealth (INDEPENDENT_AMBULATORY_CARE_PROVIDER_SITE_OTHER): Payer: BC Managed Care – PPO | Admitting: Physician Assistant

## 2019-04-25 ENCOUNTER — Encounter: Payer: Self-pay | Admitting: Physician Assistant

## 2019-04-25 ENCOUNTER — Other Ambulatory Visit: Payer: BC Managed Care – PPO | Admitting: *Deleted

## 2019-04-25 ENCOUNTER — Other Ambulatory Visit: Payer: Self-pay

## 2019-04-25 VITALS — BP 138/90 | HR 84 | Ht 62.0 in | Wt 258.0 lb

## 2019-04-25 DIAGNOSIS — R002 Palpitations: Secondary | ICD-10-CM

## 2019-04-25 DIAGNOSIS — I309 Acute pericarditis, unspecified: Secondary | ICD-10-CM

## 2019-04-25 DIAGNOSIS — I2510 Atherosclerotic heart disease of native coronary artery without angina pectoris: Secondary | ICD-10-CM | POA: Diagnosis not present

## 2019-04-25 DIAGNOSIS — I10 Essential (primary) hypertension: Secondary | ICD-10-CM

## 2019-04-25 DIAGNOSIS — Z7189 Other specified counseling: Secondary | ICD-10-CM

## 2019-04-25 MED ORDER — ESOMEPRAZOLE MAGNESIUM 20 MG PO CPDR: DELAYED_RELEASE_CAPSULE | ORAL | 0 refills | Status: DC

## 2019-04-25 MED ORDER — COLCHICINE 0.6 MG PO TABS: ORAL_TABLET | ORAL | 0 refills | Status: DC

## 2019-04-25 NOTE — Patient Instructions (Signed)
Medication Instructions:  Your physician has recommended you make the following change in your medication: Start colchicine 0.6 mg by mouth twice daily for 2 weeks. Start over the counter Nexium 20 mg once daily for 2 weeks.   *If you need a refill on your cardiac medications before your next appointment, please call your pharmacy*  Lab Work: Your physician recommends that you return for lab work today at 4:15--CRP and sed rate  If you have labs (blood work) drawn today and your tests are completely normal, you will receive your results only by: Marland Kitchen MyChart Message (if you have MyChart) OR . A paper copy in the mail If you have any lab test that is abnormal or we need to change your treatment, we will call you to review the results.  Testing/Procedures: none  Follow-Up: At [facility], you and your health needs are our priority.  As part of our continuing mission to provide you with exceptional heart care, we have created designated Provider Care Teams.  These Care Teams include your primary Cardiologist (physician) and Advanced Practice Providers (APPs -  Physician Assistants and Nurse Practitioners) who all work together to provide you with the care you need, when you need it.  Your next appointment:    November 24,2020 at 3:30  The format for your next appointment:   In Person  Provider:   Truitt Merle, NP  Other Instructions

## 2019-04-26 ENCOUNTER — Other Ambulatory Visit: Payer: Self-pay | Admitting: *Deleted

## 2019-04-26 LAB — C-REACTIVE PROTEIN: CRP: 5 mg/L (ref 0–10)

## 2019-04-26 LAB — SEDIMENTATION RATE: Sed Rate: 15 mm/hr (ref 0–40)

## 2019-04-26 NOTE — Progress Notes (Signed)
Infusion orders are current for patient CBC CMP Tylenol Benadryl appointments are up to date and follow up appointment  is scheduled TB gold not due yet.  

## 2019-04-27 ENCOUNTER — Telehealth: Payer: Self-pay

## 2019-04-27 ENCOUNTER — Other Ambulatory Visit: Payer: Self-pay | Admitting: Physician Assistant

## 2019-04-27 ENCOUNTER — Other Ambulatory Visit: Payer: Self-pay | Admitting: Rheumatology

## 2019-04-27 NOTE — Telephone Encounter (Signed)
-----   Message from [place], [place] sent at 04/26/2019 10:14 AM EST ----- Normal lab. Continue colchicine as prophylaxis until further evaluation in clinic next week. Recommended PCP evaluation as well. Forward labs to PCP.

## 2019-04-27 NOTE — Telephone Encounter (Signed)
Notes recorded by Frederik Schmidt, RN on 04/27/2019 at 9:44 AM EST  The patient has been notified of the result and verbalized understanding. All questions (if any) were answered.  Frederik Schmidt, RN 04/27/2019 9:44 AM

## 2019-04-27 NOTE — Progress Notes (Signed)
CARDIOLOGY OFFICE NOTE  Date:  05/01/2019    [name] Hanlyryphenia T Bains Date of Birth: [birth year] Medical Record #[id number]  PCP:  Gillian ScarceZanard, Robyn K, MD  Cardiologist:  Mayford Knifeurner   Chief Complaint  Patient presents with   Follow-up    Seen for Dr. Mayford Knifeurner    History of Present Illness: [name] [name] is a 53 y.o. female who presents today for an in office visit. Seen for Dr. Mayford Knifeurner.   She has a history of mild non obstructive CADwith recurrent non specific chest pain, HLD, chronic diastolic CHF, RA and depression.   She has had past extensive non invasive cardiac work up - continued to have chest pain - led to subsequent cath in 2018 - showing non obstructive CAD with 10% mid LAD lesion - no AS and normal EF.   Seen in  ER 04/15/2019 for chest pain. Troponin was negative x 3. EKG without acute abnormality. She was discharged from ER. Felt ? Pericarditis vs MSK. Started on short course of prednisone. She was seen by Vin for a telehealth visit last week - continued chest pain since out of Cardizem - worse with lying down or bending. This discomfort noted to be different from her typical fibromyalgia/rheumatoid arthritis pain. Thus asked to come in for in office visit and EKG. She has had normal sed rate and normal CRP.   The patient does not have symptoms concerning for COVID-19 infection (fever, chills, cough, or new shortness of breath).   Comes in today. Here alone. She notes that she continues to have chest pain - she describes it as "an ax" like discomfort - seems pretty constant. Might be worse with lying down. Nothing really helps. This has been going on for some time. She wonders if it is stress. She has had a visit to the ER - had 3 negative troponins but says one of her troponins were resulted wrong and then it was corrected. Her sed rate and CRP were fine. Her EKG was ok.  She notes lots of stress - has had lots of family members that have recently died. She has a son doing  drugs who she just had committed. Just lots and lots of stress in her life.   Past Medical History:  Diagnosis Date   Anxiety    Coronary artery calcification seen on CAT scan    minimal CAD with 30% prox and mild LAD   Depression    Diastolic dysfunction    Dyslipidemia 01/30/2015   Esophageal ring    Fibromyalgia    Hiatal hernia    HSV-1 infection    Morbid obesity (HCC)    Rheumatoid arthritis(714.0)    M05.79   Rheumatoid arthritis, seropositive, 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HCC)    Treated with Orencia, TB neg 09/12/2015    Past Surgical History:  Procedure Laterality Date   CARDIAC CATHETERIZATION N/A 06/11/2016   Procedure: Left Heart Cath and Coronary Angiography;  Surgeon: Corky CraftsJayadeep S Varanasi, MD;  [facility];  Service: Cardiovascular;  Laterality: N/A;   CESAREAN SECTION     COLONOSCOPY  07/27/2017   ESOPHAGEAL MANOMETRY N/A 10/28/2014   Procedure: ESOPHAGEAL MANOMETRY (EM);  Surgeon: Hilarie FredricksonJohn N Perry, MD;  Location: [facility] ENDOSCOPY;  Service: Endoscopy;  Laterality: N/A;   HERNIA REPAIR     reports surgery on 3 hernias, with 2 more present   LAPAROSCOPIC GASTRIC SLEEVE RESECTION  [facility]   LEFT HEART CATHETERIZATION WITH CORONARY ANGIOGRAM N/A 08/26/2014  Procedure: LEFT HEART CATHETERIZATION WITH CORONARY ANGIOGRAM;  Surgeon: Runell Gess, MD;  Location: [facility] CATH LAB;  Service: Cardiovascular;  Laterality: N/A;   TUBAL LIGATION       Medications: Current Meds  Medication Sig   Abatacept (ORENCIA IV) Inject 1,000 mg into the vein every 28 (twenty-eight) days.    Adapalene 0.3 % gel Apply 1 application topically daily as needed (acne).    ARIPiprazole (ABILIFY) 2 MG tablet Take 1 tablet (2 mg total) by mouth daily.   Betamethasone Valerate 0.12 % foam Apply 1 application topically daily as needed (itching). Itching    buPROPion (WELLBUTRIN XL) 300 MG 24 hr tablet Take 1 tablet (300 mg total) by mouth daily.   cetirizine  (ZYRTEC) 10 MG tablet Take 10 mg by mouth daily as needed for allergies.    clindamycin-benzoyl peroxide (BENZACLIN) gel Apply 1 application topically daily as needed (acne).    clobetasol cream (TEMOVATE) 0.05 % Apply 1 application topically daily as needed (skin discoloration).    colchicine 0.6 MG tablet Take one tablet by mouth twice daily for 2 weeks   cyclobenzaprine (FLEXERIL) 10 MG tablet Take 1 tablet by mouth daily at bedtime as needed for muscle spasms.   diclofenac Sodium (VOLTAREN) 1 % GEL Apply topically as needed.   diltiazem (CARDIZEM CD) 180 MG 24 hr capsule Take 1 capsule (180 mg total) by mouth daily.   doxepin (SINEQUAN) 25 MG capsule Take 25 mg by mouth at bedtime as needed.   esomeprazole (NEXIUM) 20 MG capsule Take one tablet by mouth daily for 2 weeks.   FABB 2.2-25-1 MG TABS TAKE 2 TABLETS BY MOUTH EVERY DAY   famciclovir (FAMVIR) 500 MG tablet Take 15,000 mg by mouth daily as needed (fever blister).    fluticasone (FLONASE) 50 MCG/ACT nasal spray Place 1 spray into both nostrils as needed for allergies or rhinitis.   gabapentin (NEURONTIN) 100 MG capsule Take 300 mg by mouth daily.   hydrOXYzine (VISTARIL) 25 MG capsule Take 25 mg by mouth every 8 (eight) hours as needed for anxiety.    lidocaine (LIDODERM) 5 % PLACE 1 PATCH ONTO THE SKIN DAILY. REMOVE & DISCARD PATCH WITHIN 12 HOURS OR AS DIRECTED BY MD   nitroGLYCERIN (NITROSTAT) 0.4 MG SL tablet Place 1 tablet (0.4 mg total) under the tongue every 5 (five) minutes as needed for chest pain.   ondansetron (ZOFRAN ODT) 4 MG disintegrating tablet Take 1 tablet (4 mg total) by mouth every 8 (eight) hours as needed for nausea or vomiting.   RASUVO 25 MG/0.5ML SOAJ INJECT ONE PEN SUBCUTANEOUSLY ONCE EVERY WEEK. STORE AT ROOM TEMPERATURE BETWEEN 68 - 77 DEGREES F. (Patient taking differently: Inject 25 mg into the skin every 7 (seven) days. )   tretinoin (RETIN-A) 0.025 % cream Apply 1 application topically  daily as needed (acne).    triamcinolone cream (KENALOG) 0.1 % Apply 1 application topically daily as needed (rash). Itching    Vitamin D, Ergocalciferol, (DRISDOL) 1.25 MG (50000 UT) CAPS capsule Take 50,000 Units by mouth See admin instructions. Take 1 tablet (50000 units) by mouth every Monday, Wednesday, Saturday   XERESE 5-1 % CREA Apply 1 application topically daily as needed (fever blisters).    [DISCONTINUED] fluticasone (FLONASE) 50 MCG/ACT nasal spray Place 1 spray into both nostrils daily. (Patient taking differently: Place 1 spray into both nostrils daily as needed for allergies. )   [DISCONTINUED] nitroGLYCERIN (NITROSTAT) 0.4 MG SL tablet Place 0.4 mg under the tongue  every 5 (five) minutes as needed for chest pain.     Allergies: Allergies  Allergen Reactions   Influenza Vaccines Anaphylaxis   Isosorbide     Severe headaches    Social History: The patient  reports that she has never smoked. She has never used smokeless tobacco. She reports that she does not drink alcohol or use drugs.   Family History: The patient's family history includes Cancer in her father; Depression in her mother; Diabetes in her maternal grandmother; Heart attack in her paternal grandmother; Hyperlipidemia in her mother; Hypertension in her maternal grandmother and paternal grandmother; Lung disease in her father; Sarcoidosis in her father.   Review of Systems: Please see the history of present illness.   All other systems are reviewed and negative.   Physical Exam: VS:  BP 126/86    Pulse 96    Ht 5\' 2"  (1.575 m)    Wt 255 lb (115.7 kg)    SpO2 97%    BMI 46.64 kg/m  .  BMI Body mass index is 46.64 kg/m.  Wt Readings from Last 3 Encounters:  05/01/19 255 lb (115.7 kg)  04/30/19 254 lb (115.2 kg)  04/25/19 258 lb (117 kg)    General: Alert and in no acute distress. Morbidly obese.    HEENT: Normal.  Neck: Supple, no JVD, carotid bruits, or masses noted.  Cardiac: Regular rate and  rhythm. No murmurs, rubs, or gallops. No edema.  Respiratory:  Lungs are clear to auscultation bilaterally with normal work of breathing.  GI: Soft and nontender.  MS: No deformity or atrophy. Gait and ROM intact.  Skin: Warm and dry. Color is normal.  Neuro:  Strength and sensation are intact and no gross focal deficits noted.  Psych: Alert, appropriate and with normal affect.   LABORATORY DATA:  EKG:  EKG is ordered today. This demonstrates NSR.  Lab Results  Component Value Date   WBC 7.8 04/15/2019   HGB 12.8 04/15/2019   HCT 42.1 04/15/2019   PLT 257 04/15/2019   GLUCOSE 92 04/15/2019   CHOL 152 02/06/2015   TRIG 82.0 02/06/2015   HDL 55.50 02/06/2015   LDLCALC 80 02/06/2015   ALT 14 04/15/2019   AST 18 04/15/2019   NA 143 04/15/2019   K 3.9 04/15/2019   CL 109 04/15/2019   CREATININE 0.89 04/15/2019   BUN 12 04/15/2019   CO2 27 04/15/2019   INR 1.0 06/04/2016     BNP (last 3 results) No results for input(s): BNP in the last 8760 hours.  ProBNP (last 3 results) No results for input(s): PROBNP in the last 8760 hours.   Other Studies Reviewed Today:  Left Heart Cath and Coronary Angiography 06/2016  Conclusion    The left ventricular systolic function is normal.  LV end diastolic pressure is normal.  The left ventricular ejection fraction is 55-65% by visual estimate.  There is no aortic valve stenosis.  Mid LAD lesion, 10 %stenosed.  Separate ostia of the LAD and circumflex.   Continue preventive medical therapy.   Telemetry Study Highlights 2017   Normal sinus rhythm and sinus tachycardia with average heart rate 92bpm. The heart rate ranged from 70-120bpm.  Rare PAC     Echo Study Conclusions 2014  - Left ventricle: The cavity size was normal. Wall thickness  was normal. Systolic function was normal. The estimated  ejection fraction was in the range of 50% to 55%. Doppler  parameters are consistent with abnormal left ventricular  relaxation (grade 1 diastolic dysfunction). Doppler  parameters are consistent with high ventricular filling  pressure.  - Left atrium: The atrium was mildly dilated.   ASSESSMENT & PLAN:    1. Chest pain - atypical and chronic - she has known non obstructive CAD with 10% LAD lesion per cath from 2018 - I really think this is all anxiety related - we will get an echo to make sure that structurally her heart is ok. Further disposition to follow.   2. Fibromyalgia/RA  3. Obesity  4. HTN - BP is fine.   5. Significant situational stress - I think this is the crux of her issues. Discussed at length.    6. COVID-19 Education: The signs and symptoms of COVID-19 were discussed with the patient and how to seek care for testing (follow up with PCP or arrange E-visit).  The importance of social distancing, staying at home, hand hygiene and wearing a mask when out in public were discussed today.  Current medicines are reviewed with the patient today.  The patient does not have concerns regarding medicines other than what has been noted above.  The following changes have been made:  See above.  Labs/ tests ordered today include:    Orders Placed This Encounter  Procedures   EKG 12-Lead   ECHOCARDIOGRAM COMPLETE     Disposition:  Further disposition pending.   Patient is agreeable to this plan and will call if any problems develop in the interim.   SignedTruitt Merle, NP  05/01/2019 4:18 PM  [facility] [address] [place], [place]  [address] Phone: [phone] Fax: [phone]

## 2019-04-30 ENCOUNTER — Ambulatory Visit (HOSPITAL_COMMUNITY)
Admission: RE | Admit: 2019-04-30 | Discharge: 2019-04-30 | Disposition: A | Discharge status: Home / Self Care | Payer: BC Managed Care – PPO | Source: Ambulatory Visit | Attending: Rheumatology | Admitting: Rheumatology

## 2019-04-30 ENCOUNTER — Telehealth: Payer: BC Managed Care – PPO | Admitting: Physician Assistant

## 2019-04-30 DIAGNOSIS — M0579 Rheumatoid arthritis with rheumatoid factor of multiple sites without organ or systems involvement: Secondary | ICD-10-CM | POA: Diagnosis present

## 2019-04-30 MED ORDER — SODIUM CHLORIDE 0.9 % IV SOLN
1000.0000 mg | INTRAVENOUS | Status: DC
  Administered 2019-04-30: 1000 mg via INTRAVENOUS
  Filled 2019-04-30: qty 40

## 2019-04-30 MED ORDER — ACETAMINOPHEN 325 MG PO TABS
650.0000 mg | ORAL_TABLET | ORAL | Status: DC
  Administered 2019-04-30: 650 mg via ORAL

## 2019-04-30 MED ORDER — ACETAMINOPHEN 325 MG PO TABS
ORAL_TABLET | ORAL | Status: AC
  Filled 2019-04-30: qty 2

## 2019-04-30 MED ORDER — DIPHENHYDRAMINE HCL 25 MG PO CAPS
25.0000 mg | ORAL_CAPSULE | ORAL | Status: DC
  Administered 2019-04-30: 25 mg via ORAL

## 2019-04-30 MED ORDER — DIPHENHYDRAMINE HCL 25 MG PO CAPS
ORAL_CAPSULE | ORAL | Status: AC
  Filled 2019-04-30: qty 1

## 2019-04-30 NOTE — Telephone Encounter (Signed)
Last Visit: 03/07/2019 telemedicine  Next Visit: message sent to the front desk to schedule.   Okay to refill per Dr. Deveshwar.  

## 2019-04-30 NOTE — Telephone Encounter (Signed)
Last Visit: 03/07/2019 telemedicine  Next Visit: message sent to the front desk to schedule.   Okay to refill FABB?

## 2019-04-30 NOTE — Telephone Encounter (Signed)
Ok to refill 

## 2019-05-01 ENCOUNTER — Ambulatory Visit (INDEPENDENT_AMBULATORY_CARE_PROVIDER_SITE_OTHER): Payer: BC Managed Care – PPO | Admitting: Nurse Practitioner

## 2019-05-01 ENCOUNTER — Telehealth: Payer: Self-pay | Admitting: Rheumatology

## 2019-05-01 ENCOUNTER — Encounter: Payer: Self-pay | Admitting: Nurse Practitioner

## 2019-05-01 ENCOUNTER — Other Ambulatory Visit: Payer: Self-pay

## 2019-05-01 VITALS — BP 126/86 | HR 96 | Ht 62.0 in | Wt 255.0 lb

## 2019-05-01 DIAGNOSIS — I10 Essential (primary) hypertension: Secondary | ICD-10-CM | POA: Diagnosis not present

## 2019-05-01 DIAGNOSIS — R079 Chest pain, unspecified: Secondary | ICD-10-CM | POA: Diagnosis not present

## 2019-05-01 DIAGNOSIS — Z7189 Other specified counseling: Secondary | ICD-10-CM

## 2019-05-01 DIAGNOSIS — I2510 Atherosclerotic heart disease of native coronary artery without angina pectoris: Secondary | ICD-10-CM | POA: Diagnosis not present

## 2019-05-01 MED ORDER — NITROGLYCERIN 0.4 MG SL SUBL: 0.4000 mg | SUBLINGUAL_TABLET | SUBLINGUAL | 3 refills | Status: DC | PRN

## 2019-05-01 NOTE — Telephone Encounter (Signed)
-----   Message from Hartly sent at 04/30/2019  9:12 AM EST ----- Please call patient to schedule follow up, patient is due now. Thanks!

## 2019-05-01 NOTE — Patient Instructions (Addendum)
After Visit Summary:  We will be checking the following labs today - NONE   Medication Instructions:    Continue with your current medicines.    If you need a refill on your cardiac medications before your next appointment, please call your pharmacy.     Testing/Procedures To Be Arranged:  Echocardiogram  Follow-Up:   Let's see how the echo turns out and then we will decide about follow up     At [facility], you and your health needs are our priority.  As part of our continuing mission to provide you with exceptional heart care, we have created designated Provider Care Teams.  These Care Teams include your primary Cardiologist (physician) and Advanced Practice Providers (APPs -  Physician Assistants and Nurse Practitioners) who all work together to provide you with the care you need, when you need it.  Special Instructions:  . Stay safe, stay home, wash your hands for at least 20 seconds and wear a mask when out in public.   . Think about what we talked about today.    Call the [place] office at [phone] if you have any questions, problems or concerns.

## 2019-05-01 NOTE — Telephone Encounter (Signed)
LMOM for patient to call and schedule follow-up appointment.   °

## 2019-05-02 ENCOUNTER — Ambulatory Visit (HOSPITAL_COMMUNITY): Payer: BC Managed Care – PPO | Attending: Cardiovascular Disease

## 2019-05-02 DIAGNOSIS — R079 Chest pain, unspecified: Secondary | ICD-10-CM | POA: Insufficient documentation

## 2019-05-02 MED ORDER — PERFLUTREN LIPID MICROSPHERE
1.0000 mL | INTRAVENOUS | Status: AC | PRN
  Administered 2019-05-02: 1 mL via INTRAVENOUS

## 2019-05-04 ENCOUNTER — Encounter (HOSPITAL_COMMUNITY): Payer: BC Managed Care – PPO

## 2019-05-23 ENCOUNTER — Other Ambulatory Visit: Payer: Self-pay | Admitting: Physician Assistant

## 2019-05-28 ENCOUNTER — Other Ambulatory Visit: Payer: Self-pay | Admitting: Rheumatology

## 2019-05-28 ENCOUNTER — Ambulatory Visit (HOSPITAL_COMMUNITY)
Admission: RE | Admit: 2019-05-28 | Discharge: 2019-05-28 | Disposition: A | Discharge status: Home / Self Care | Payer: BC Managed Care – PPO | Source: Ambulatory Visit | Attending: Rheumatology | Admitting: Rheumatology

## 2019-05-28 ENCOUNTER — Other Ambulatory Visit: Payer: Self-pay

## 2019-05-28 DIAGNOSIS — M0579 Rheumatoid arthritis with rheumatoid factor of multiple sites without organ or systems involvement: Secondary | ICD-10-CM

## 2019-05-28 MED ORDER — SODIUM CHLORIDE 0.9 % IV SOLN
1000.0000 mg | INTRAVENOUS | Status: DC
  Administered 2019-05-28: 14:00:00 1000 mg via INTRAVENOUS
  Filled 2019-05-28: qty 40

## 2019-05-28 MED ORDER — DIPHENHYDRAMINE HCL 25 MG PO CAPS
ORAL_CAPSULE | ORAL | Status: AC
  Administered 2019-05-28: 25 mg via ORAL
  Filled 2019-05-28: qty 1

## 2019-05-28 MED ORDER — ACETAMINOPHEN 325 MG PO TABS
ORAL_TABLET | ORAL | Status: AC
  Filled 2019-05-28: qty 2

## 2019-05-28 MED ORDER — DIPHENHYDRAMINE HCL 25 MG PO CAPS: 25.0000 mg | ORAL_CAPSULE | ORAL | Status: AC

## 2019-05-28 MED ORDER — ACETAMINOPHEN 325 MG PO TABS
650.0000 mg | ORAL_TABLET | ORAL | Status: AC
  Administered 2019-05-28: 650 mg via ORAL

## 2019-05-28 NOTE — Telephone Encounter (Signed)
Last Visit: 03/07/2019 Next Visit: message sent to the front desk to schedule.   Okay to refill per Dr. Deveshwar.  

## 2019-06-06 ENCOUNTER — Telehealth: Payer: Self-pay | Admitting: Pharmacy Technician

## 2019-06-06 ENCOUNTER — Other Ambulatory Visit: Payer: Self-pay | Admitting: Rheumatology

## 2019-06-06 NOTE — Telephone Encounter (Signed)
Last Visit: 03/07/2019 telemedicine  Next Visit: message sent to the front desk to schedule.   Okay to refill per Dr. Estanislado Pandy.

## 2019-06-06 NOTE — Telephone Encounter (Signed)
Submitted new Orencia IV precertification for 1157. Submitted patient h&p, now awaiting response.  Submitted Josem Kaufmann was for J0129, was advised no Josem Kaufmann is needed for 96413 (1st hour of infusion).   Submitted for 14 units, with coverage 06/08/19 to 06/06/20   Case# [id number] Phone# [phone] Fax# [phone]  Ref# [id number] Estimated 1-15 day turnaround time  9:20 AM Beatriz Chancellor, CPhT

## 2019-06-12 NOTE — Telephone Encounter (Signed)
Received notification from [facility] regarding a prior authorization for [facility] IV. Authorization has been APPROVED from 06/08/2019 to 06/06/2020. Approved for 14 units.  Will send document to scan center.  Authorization # [id number] Phone # [phone]  10:00 AM Dorthula Nettles, CPhT

## 2019-06-25 ENCOUNTER — Other Ambulatory Visit: Payer: Self-pay | Admitting: *Deleted

## 2019-06-25 ENCOUNTER — Ambulatory Visit (HOSPITAL_COMMUNITY)
Admission: RE | Admit: 2019-06-25 | Discharge: 2019-06-25 | Disposition: A | Discharge status: Home / Self Care | Payer: BC Managed Care – PPO | Source: Ambulatory Visit | Attending: Rheumatology | Admitting: Rheumatology

## 2019-06-25 ENCOUNTER — Other Ambulatory Visit: Payer: Self-pay

## 2019-06-25 DIAGNOSIS — M0579 Rheumatoid arthritis with rheumatoid factor of multiple sites without organ or systems involvement: Secondary | ICD-10-CM

## 2019-06-25 LAB — CBC
HCT: 43.9 % (ref 36.0–46.0)
Hemoglobin: 13.2 g/dL (ref 12.0–15.0)
MCH: 26.6 pg (ref 26.0–34.0)
MCHC: 30.1 g/dL (ref 30.0–36.0)
MCV: 88.3 fL (ref 80.0–100.0)
Platelets: 301 10*3/uL (ref 150–400)
RBC: 4.97 MIL/uL (ref 3.87–5.11)
RDW: 12.8 % (ref 11.5–15.5)
WBC: 9 10*3/uL (ref 4.0–10.5)
nRBC: 0 % (ref 0.0–0.2)

## 2019-06-25 LAB — COMPREHENSIVE METABOLIC PANEL
ALT: 14 U/L (ref 0–44)
AST: 23 U/L (ref 15–41)
Albumin: 3.2 g/dL — ABNORMAL LOW (ref 3.5–5.0)
Alkaline Phosphatase: 71 U/L (ref 38–126)
Anion gap: 9 (ref 5–15)
BUN: 16 mg/dL (ref 6–20)
CO2: 26 mmol/L (ref 22–32)
Calcium: 9 mg/dL (ref 8.9–10.3)
Chloride: 107 mmol/L (ref 98–111)
Creatinine, Ser: 1.01 mg/dL — ABNORMAL HIGH (ref 0.44–1.00)
GFR calc Af Amer: >60 mL/min (ref >60)
GFR calc non Af Amer: >60 mL/min (ref >60)
Glucose, Bld: 124 mg/dL — ABNORMAL HIGH (ref 70–99)
Potassium: 4.4 mmol/L (ref 3.5–5.1)
Sodium: 142 mmol/L (ref 135–145)
Total Bilirubin: 0.8 mg/dL (ref 0.3–1.2)
Total Protein: 6.3 g/dL — ABNORMAL LOW (ref 6.5–8.1)

## 2019-06-25 MED ORDER — ACETAMINOPHEN 325 MG PO TABS
ORAL_TABLET | ORAL | Status: AC
  Filled 2019-06-25: qty 2

## 2019-06-25 MED ORDER — ACETAMINOPHEN 325 MG PO TABS
650.0000 mg | ORAL_TABLET | ORAL | Status: DC
  Administered 2019-06-25: 650 mg via ORAL

## 2019-06-25 MED ORDER — DIPHENHYDRAMINE HCL 25 MG PO CAPS
ORAL_CAPSULE | ORAL | Status: AC
  Filled 2019-06-25: qty 1

## 2019-06-25 MED ORDER — DIPHENHYDRAMINE HCL 25 MG PO CAPS
25.0000 mg | ORAL_CAPSULE | ORAL | Status: DC
  Administered 2019-06-25: 25 mg via ORAL

## 2019-06-25 MED ORDER — SODIUM CHLORIDE 0.9 % IV SOLN
1000.0000 mg | INTRAVENOUS | Status: DC
  Administered 2019-06-25: 1000 mg via INTRAVENOUS
  Filled 2019-06-25: qty 40

## 2019-06-25 NOTE — Progress Notes (Signed)
CBC is normal.

## 2019-06-26 ENCOUNTER — Telehealth: Payer: Self-pay | Admitting: *Deleted

## 2019-06-26 ENCOUNTER — Other Ambulatory Visit: Payer: Self-pay | Admitting: *Deleted

## 2019-06-26 DIAGNOSIS — M797 Fibromyalgia: Secondary | ICD-10-CM

## 2019-06-26 NOTE — Telephone Encounter (Signed)
Patient requesting referral to [facility] for massage therapy. Please advise.

## 2019-06-26 NOTE — Telephone Encounter (Signed)
Ok to place referral.

## 2019-06-26 NOTE — Progress Notes (Signed)
Creatinine is mildly elevated.  Calcium is normal.  Please advise patient to increase fluid intake.  We will continue to monitor labs.

## 2019-07-05 ENCOUNTER — Other Ambulatory Visit: Payer: Self-pay | Admitting: Rheumatology

## 2019-07-05 NOTE — Telephone Encounter (Signed)
Please schedule patient for a follow up visit. Patient was due November 2020. Thanks! 

## 2019-07-05 NOTE — Telephone Encounter (Signed)
Last Visit: 03/07/2019 telemedicine  Next Visit: was due November 2020. Message sent to front to schedule patient   Okay to refill per Dr. Corliss Skains

## 2019-07-09 ENCOUNTER — Telehealth: Payer: Self-pay | Admitting: Pharmacy Technician

## 2019-07-09 NOTE — Telephone Encounter (Signed)
Submitting a Prior Authorization request to [facility] for Rasuvo via fax. Will update once we receive a response.  *Faxed to office for [facility] signature and then to be faxed to [facility]

## 2019-07-10 ENCOUNTER — Other Ambulatory Visit: Payer: Self-pay | Admitting: *Deleted

## 2019-07-10 NOTE — Progress Notes (Signed)
Infusion orders are current for patient CBC CMP Tylenol Benadryl appointments are up to date and follow up appointment  is scheduled TB gold not due yet.  

## 2019-07-10 NOTE — Telephone Encounter (Signed)
Received notification from [facility] regarding a prior authorization for RASUVO. Authorization has been APPROVED from 07/09/2019 to 07/08/2020.   Will send document to scan center. PA # [employer] [id number]   Verlin Fester, PharmD, [place], CPP Clinical Specialty Pharmacist [phone]  07/10/2019 8:13 AM

## 2019-07-16 ENCOUNTER — Other Ambulatory Visit: Payer: Self-pay | Admitting: Physician Assistant

## 2019-07-17 ENCOUNTER — Telehealth: Payer: Self-pay | Admitting: Rheumatology

## 2019-07-17 NOTE — Telephone Encounter (Signed)
Last Visit: 03/07/2019 telemedicine  Next Visit: was due November 2020. Message sent to front to schedule patient   Okay to refill per Dr. Deveshwar 

## 2019-07-17 NOTE — Telephone Encounter (Signed)
Please schedule patient for a follow up visit. Patient was due November 2020. Thanks! 

## 2019-07-17 NOTE — Telephone Encounter (Signed)
Flu vaccine and Covid vaccine do not cross-react.  Patient may discuss with the doctor when she receives the Covid vaccine.

## 2019-07-17 NOTE — Telephone Encounter (Signed)
Patient advised Flu vaccine and Covid vaccine do not cross-react.  Patient may discuss with the doctor when she receives the Covid vaccine. Patient advised she does not have to hold her medications to receive the vaccine.

## 2019-07-17 NOTE — Telephone Encounter (Signed)
Patient called stating "she is allergic to the flu vaccine" and is asking for Dr. Fatima Sanger advice on whether she should get the COVID vaccine.  Patient requested a return call.

## 2019-07-23 ENCOUNTER — Ambulatory Visit (HOSPITAL_COMMUNITY)
Admission: RE | Admit: 2019-07-23 | Discharge: 2019-07-23 | Disposition: A | Discharge status: Home / Self Care | Payer: BC Managed Care – PPO | Source: Ambulatory Visit | Attending: Rheumatology | Admitting: Rheumatology

## 2019-07-23 ENCOUNTER — Other Ambulatory Visit: Payer: Self-pay

## 2019-07-23 DIAGNOSIS — M0579 Rheumatoid arthritis with rheumatoid factor of multiple sites without organ or systems involvement: Secondary | ICD-10-CM | POA: Insufficient documentation

## 2019-07-23 MED ORDER — DIPHENHYDRAMINE HCL 25 MG PO CAPS
ORAL_CAPSULE | ORAL | Status: AC
  Administered 2019-07-23: 25 mg via ORAL
  Filled 2019-07-23: qty 1

## 2019-07-23 MED ORDER — DIPHENHYDRAMINE HCL 25 MG PO CAPS: 25.0000 mg | ORAL_CAPSULE | ORAL | Status: DC

## 2019-07-23 MED ORDER — ACETAMINOPHEN 325 MG PO TABS: 650.0000 mg | ORAL_TABLET | ORAL | Status: DC

## 2019-07-23 MED ORDER — ACETAMINOPHEN 325 MG PO TABS
ORAL_TABLET | ORAL | Status: AC
  Administered 2019-07-23: 13:00:00 650 mg via ORAL
  Filled 2019-07-23: qty 2

## 2019-07-23 MED ORDER — SODIUM CHLORIDE 0.9 % IV SOLN
1000.0000 mg | INTRAVENOUS | Status: DC
  Administered 2019-07-23: 15:00:00 1000 mg via INTRAVENOUS
  Filled 2019-07-23: qty 40

## 2019-07-31 ENCOUNTER — Telehealth (INDEPENDENT_AMBULATORY_CARE_PROVIDER_SITE_OTHER): Payer: BC Managed Care – PPO | Admitting: Rheumatology

## 2019-07-31 ENCOUNTER — Ambulatory Visit: Payer: Self-pay

## 2019-07-31 ENCOUNTER — Encounter: Payer: Self-pay | Admitting: Rheumatology

## 2019-07-31 ENCOUNTER — Other Ambulatory Visit: Payer: Self-pay

## 2019-07-31 VITALS — BP 145/95 | HR 90 | Resp 15 | Ht 62.0 in | Wt 267.0 lb

## 2019-07-31 DIAGNOSIS — M25551 Pain in right hip: Secondary | ICD-10-CM

## 2019-07-31 DIAGNOSIS — M170 Bilateral primary osteoarthritis of knee: Secondary | ICD-10-CM

## 2019-07-31 DIAGNOSIS — M0579 Rheumatoid arthritis with rheumatoid factor of multiple sites without organ or systems involvement: Secondary | ICD-10-CM | POA: Diagnosis not present

## 2019-07-31 DIAGNOSIS — M79641 Pain in right hand: Secondary | ICD-10-CM

## 2019-07-31 DIAGNOSIS — Z8659 Personal history of other mental and behavioral disorders: Secondary | ICD-10-CM

## 2019-07-31 DIAGNOSIS — M79672 Pain in left foot: Secondary | ICD-10-CM

## 2019-07-31 DIAGNOSIS — M7061 Trochanteric bursitis, right hip: Secondary | ICD-10-CM | POA: Diagnosis not present

## 2019-07-31 DIAGNOSIS — M797 Fibromyalgia: Secondary | ICD-10-CM | POA: Diagnosis not present

## 2019-07-31 DIAGNOSIS — M79671 Pain in right foot: Secondary | ICD-10-CM | POA: Diagnosis not present

## 2019-07-31 DIAGNOSIS — R5383 Other fatigue: Secondary | ICD-10-CM

## 2019-07-31 DIAGNOSIS — Z79899 Other long term (current) drug therapy: Secondary | ICD-10-CM

## 2019-07-31 DIAGNOSIS — M79642 Pain in left hand: Secondary | ICD-10-CM

## 2019-07-31 DIAGNOSIS — Z8639 Personal history of other endocrine, nutritional and metabolic disease: Secondary | ICD-10-CM

## 2019-07-31 DIAGNOSIS — M7062 Trochanteric bursitis, left hip: Secondary | ICD-10-CM

## 2019-07-31 DIAGNOSIS — Z9884 Bariatric surgery status: Secondary | ICD-10-CM

## 2019-07-31 DIAGNOSIS — M25552 Pain in left hip: Secondary | ICD-10-CM

## 2019-07-31 DIAGNOSIS — R911 Solitary pulmonary nodule: Secondary | ICD-10-CM

## 2019-07-31 DIAGNOSIS — E785 Hyperlipidemia, unspecified: Secondary | ICD-10-CM

## 2019-07-31 DIAGNOSIS — Z8679 Personal history of other diseases of the circulatory system: Secondary | ICD-10-CM

## 2019-07-31 DIAGNOSIS — G4709 Other insomnia: Secondary | ICD-10-CM

## 2019-07-31 MED ORDER — CYCLOBENZAPRINE HCL 10 MG PO TABS: ORAL_TABLET | ORAL | 0 refills | Status: DC

## 2019-07-31 MED ORDER — DICLOFENAC SODIUM 1 % EX GEL: CUTANEOUS | 4 refills | Status: DC

## 2019-07-31 NOTE — Progress Notes (Signed)
Office Visit Note  Patient: [name] [name]             Date of Birth: 1965/11/25           MRN: [id number]             PCP: [name], MD Referring: [name], MD Visit Date: 07/31/2019 Occupation: @GUAROCC @  Subjective:  Left hand pain   History of Present Illness: NARYA BEAVIN is a 54 y.o. female with history of seropositive rheumatoid arthritis and fibromyalgia.  She is on Orencia 1,000 mg IV infusions every 28 days, Rasuvo 20 mg sq injections once weekly, and folic acid 2 mg po daily.  She has not missed any doses of Orencia or Rasuvo recently.  She presents today with left hand and wrist joint pain, which started yesterday.  She has chronic pain in both ankle joints.  She continues to have generalized muscle aches and muscle tenderness due to fibromyalgia.  She has trochanteric bursitis bilaterally.   Activities of Daily Living:  Patient reports morning stiffness for 1  hours.   Patient Reports nocturnal pain.  Difficulty dressing/grooming: Denies Difficulty climbing stairs: Reports Difficulty getting out of chair: Reports Difficulty using hands for taps, buttons, cutlery, and/or writing: Reports  Review of Systems  Constitutional: Positive for fatigue.  HENT: Negative for mouth sores, mouth dryness and nose dryness (Nasal ulcerations ).   Eyes: Negative for pain, visual disturbance and dryness.  Respiratory: Negative for cough, hemoptysis, shortness of breath and difficulty breathing.   Cardiovascular: Negative for chest pain, palpitations, hypertension and swelling in legs/feet.  Gastrointestinal: Negative for blood in stool, constipation and diarrhea.  Endocrine: Negative for increased urination.  Genitourinary: Negative for painful urination.  Musculoskeletal: Positive for arthralgias, joint pain, myalgias, morning stiffness, muscle tenderness and myalgias. Negative for joint swelling and muscle weakness.  Skin: Negative for color change, pallor,  rash, hair loss, nodules/bumps, skin tightness, ulcers and sensitivity to sunlight.  Allergic/Immunologic: Negative for susceptible to infections.  Neurological: Negative for dizziness, numbness, headaches and weakness.  Hematological: Negative for swollen glands.  Psychiatric/Behavioral: Positive for depressed mood and sleep disturbance. The patient is nervous/anxious.     PMFS History:  Patient Active Problem List   Diagnosis Date Noted  . High risk medication use 08/03/2016  . Fibromyalgia syndrome 08/03/2016  . Abnormal cardiac CT angiography   . Sacrococcygeal pain 02/20/2016  . Rheumatoid arthritis involving 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HCC) 01/24/2016  . 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 Dyslipidemia 01/30/2015  . 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 Essential hypertension 08/25/2014  . Morbid obesity (HCC) 08/25/2014  . CAD (coronary artery disease), native coronary artery 08/24/2014  . Numbness and tingling in left arm   . Arm numbness left 08/22/2014  . Chest pain 08/22/2014  . Gastroesophageal reflux disease without esophagitis 07/23/2014  . Coronary artery calcification seen on CAT scan 02/19/2014  . Hip pain 10/23/2013  . Solitary pulmonary nodule 08/28/2013  . Chest pain, atypical 07/18/2013  . Heart palpitations 06/27/2013  . Cough 06/04/2013  . Diastolic dysfunction 05/18/2013  . SOB (shortness of breath) 05/09/2013  . Other malaise and fatigue 01/21/2013  . Stress and adjustment reaction 12/09/2012  . Right sided abdominal pain 11/29/2012  . 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/21/2012  . Morbid obesity with BMI of 50.0-59.9, adult (HCC) 11/21/2012  . Arthritis 07/13/2012  . Depression 07/13/2012  . Routine health maintenance 05/07/2012    Past Medical  History:  Diagnosis Date  . Anxiety   . Bursitis   . Coronary artery calcification seen on CAT scan    minimal CAD with 30% prox and mild LAD  .  Depression   . Diastolic dysfunction   . Dyslipidemia 01/30/2015  . Esophageal ring   . Fibromyalgia   . Hiatal hernia   . HSV-1 infection   . Morbid obesity ([place])   . Osteoarthritis   . Rheumatoid arthritis(714.0)    M05.79  . Rheumatoid arthritis, seropositive, 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[place])    Treated with Orencia, TB neg 09/12/2015  . Spondylolysis     Family History  Problem Relation Age of Onset  . Hyperlipidemia Mother   . Depression Mother   . Sarcoidosis Father   . Lung disease Father        Pleural Mesothelioma  . Cancer Father   . Heart attack Paternal Grandmother   . Hypertension Paternal Grandmother   . Hypertension Maternal Grandmother   . Diabetes Maternal Grandmother   . Stroke Neg Hx   . Colon cancer Neg Hx   . Esophageal cancer Neg Hx   . Rectal cancer Neg Hx   . Stomach cancer Neg Hx    Past Surgical History:  Procedure Laterality Date  . CARDIAC CATHETERIZATION N/A 06/11/2016   Procedure: Left Heart Cath and Coronary Angiography;  Surgeon: Jettie Booze, MD;  Location: [facility];  Service: Cardiovascular;  Laterality: N/A;  . CESAREAN SECTION    . COLONOSCOPY  07/27/2017  . ESOPHAGEAL MANOMETRY N/A 10/28/2014   Procedure: ESOPHAGEAL MANOMETRY (EM);  Surgeon: Irene Shipper, MD;  Location: [facility];  Service: Endoscopy;  Laterality: N/A;  . HERNIA REPAIR     reports surgery on 3 hernias, with 2 more present  . LAPAROSCOPIC GASTRIC SLEEVE RESECTION  11/21/12   [facility]  . LEFT HEART CATHETERIZATION WITH CORONARY ANGIOGRAM N/A 08/26/2014   Procedure: LEFT HEART CATHETERIZATION WITH CORONARY ANGIOGRAM;  Surgeon: Lorretta Harp, MD;  Location: [facility];  Service: Cardiovascular;  Laterality: N/A;  . TUBAL LIGATION     Social History   Social History Narrative   Marital Status: Married Aeronautical engineer)    Children:  Son Marcello Moores) Daughter Margreta Journey)    Pets: None    Living Situation: Lives with husband and children    Occupation: Counsellor Scientist, clinical (histocompatibility and immunogenetics))     Education: Dietitian (Psychology)     Tobacco Use/Exposure:  None    Alcohol Use:  Occasional   Drug Use:  None   Diet:  Regular   Exercise: Walking or Treadmill (2 x week)   Hobbies: Barrister's clerk, [employer] Decorations.               Immunization History  Administered Date(s) Administered  . Tdap 09/21/2016     Objective: Vital Signs: BP (!) 145/95 (BP Location: Right Wrist, Patient Position: Sitting, Cuff Size: Normal)   Pulse 90   Resp 15   Ht 5\' 2"  (1.575 m)   Wt 267 lb (121.1 kg)   BMI 48.83 kg/m    Physical Exam Vitals and nursing note reviewed.  Constitutional:      Appearance: She is well-developed.  HENT:     Head: Normocephalic and atraumatic.  Eyes:     Conjunctiva/sclera: Conjunctivae normal.  Cardiovascular:     Rate and Rhythm: Normal rate and regular rhythm.     Heart sounds: Normal heart sounds.  Pulmonary:     Effort: Pulmonary effort  is normal.     Breath sounds: Normal breath sounds.  Abdominal:     General: Bowel sounds are normal.     Palpations: Abdomen is soft.  Musculoskeletal:     Cervical back: Normal range of motion.  Lymphadenopathy:     Cervical: No cervical adenopathy.  Skin:    General: Skin is warm and dry.     Capillary Refill: Capillary refill takes less than 2 seconds.  Neurological:     Mental Status: She is alert and oriented to person, place, and time.  Psychiatric:        Behavior: Behavior normal.      Musculoskeletal Exam: Generalized hyperalgesia and positive tender points. C-spine, thoracic spine, and lumbar spine good ROM. Painful shoulder joint abduction.  Elbow joints, wrist joints, MCPs, PIPs, and DIPs good ROM with no synovitis. DIP synovial thickening.  No synovitis of MCP joints. Tenderness of the right wrist joint.  Hip joints, knee joints, ankle joints, MTPs, PIPs, and DIPs good ROM with no synovitis.  No warmth or effusion of knee joints.  No tenderness or swelling of  ankle joints.   CDAI Exam: CDAI Score: -- Patient Global: 6; Provider Global: 2 Swollen: --; Tender: -- Joint Exam 07/31/2019   No joint exam has been documented for this visit   There is currently no information documented on the homunculus. Go to the Rheumatology activity and complete the homunculus joint exam.  Investigation: No additional findings.  Imaging: No results found.  Recent Labs: Lab Results  Component Value Date   WBC 9.0 06/25/2019   HGB 13.2 06/25/2019   PLT 301 06/25/2019   NA 142 06/25/2019   K 4.4 06/25/2019   CL 107 06/25/2019   CO2 26 06/25/2019   GLUCOSE 124 (H) 06/25/2019   BUN 16 06/25/2019   CREATININE 1.01 (H) 06/25/2019   BILITOT 0.8 06/25/2019   ALKPHOS 71 06/25/2019   AST 23 06/25/2019   ALT 14 06/25/2019   PROT 6.3 (L) 06/25/2019   ALBUMIN 3.2 (L) 06/25/2019   CALCIUM 9.0 06/25/2019   GFRAA >60 06/25/2019   QFTBGOLD Negative 09/01/2016   QFTBGOLDPLUS Negative 08/24/2018   X-rays obtained of bilateral hands today.  X-rays of the right hand 2 views showed mild juxta-articular osteopenia.  No MCP, PIP or DIP narrowing was noted.  No intercarpal radiocarpal joint space narrowing was noted.  No erosive changes were noted. Impression: Unremarkable x-ray of the right hand except the juxta-articular osteopenia.  X-rays of the left hand 2 views were reviewed.  X-rays showed mild juxta-articular osteopenia.  No MCP, PIP, DIP, intercarpal or radiocarpal joint space narrowing was noted.  No erosive changes were noted.Impression: X-ray of the left hand was unremarkable except the juxta-articular osteopenia.  X-rays of the right foot 2 views showed right first MTP, PIP and DIP narrowing.  None of the other MTP showed any narrowing.  No intertarsal tibiotalar joint space narrowing was noted.  Inferior and posterior calcaneal spurs were noted. Impression: These findings are consistent with mild osteoarthritis of the foot.  X-rays of the left foot 2  views were obtained.  PIP and DIP narrowing was noted.  No MTP, intertarsal or tibiotalar joint space narrowing was noted.  Dorsal spurring was noted.  Inferior and posterior calcaneal spurs were noted.  Impression: These findings are consistent with osteoarthritis of the foot.  X-ray of the pelvis 1 view showed no hip joint narrowing.  No SI joint changes were noted.  Impression: Unremarkable x-ray of  the pelvis.  Speciality Comments: ORENCIA 1000 mg x 4 weeks  Procedures:  No procedures performed Allergies: Influenza vaccines and Isosorbide   Assessment / Plan:     Visit Diagnoses: Rheumatoid arthritis involving multiple sites with positive rheumatoid factor (HCC) -She has no synovitis on exam.  She has tenderness of the left wrist joint but no synovitis noted.  She has chronic pain in both hands and both ankle joints.  She is clinically doing well on Orencia 1000 mg IV infusions, Rasuvo 20 mg sq injections once weekly, and folic acid 2 mg po daily. She was encouraged to use voltaren gel topically as needed for pain relief.  A refill of voltaren gel was sent to the pharmacy today.  X-rays of both hands and both feet were obtained today to assess for interval change.  X-rays were unremarkable and did not show any erosive changes.  She will continue on the current treatment regimen. She was advised to notify us if she develops increased joint pain or joint swelling.  She will follow up in 5 months.  Plan: XR Hand 2 View Right, XR Hand 2 View Left, XR Foot 2 Views Left, XR Foot 2 Views Right.  X-ray of bilateral hands were unremarkable.  High risk medication use - Orencia 1,000 mg IV infusions, Rasuvo 20 mg sq injections once weekly, folic acid 2 mg po daily.  CBC and CMP were drawn on 06/25/19. She has lab work drawn at her infusions.  TB gold negative on 08/24/18. Future order for TB gold was placed today.   - Plan: QuantiFERON-TB Gold Plus  Primary osteoarthritis of both knees: She has good ROM of  both knee joints.  No warmth or effusion of knee joints.    Fibromyalgia: She has generalized hyperalgesia and positive tender points. She continues to have recurrent fibromyalgia flares. She has generalized muscle aches and muscle tenderness. A refill of flexeril was sent to the pharmacy. She has tenderness over bilateral trochanteric bursa.  She was encouraged to perform stretching exercises daily.  We discussed the importance of regular exercise and good sleep hygiene.    Other fatigue: Chronic and secondary to insomnia.   Other insomnia: She has interrupted sleep at night due to nocturnal pain.  Good sleep hygiene was discussed.   Pain in both hands -She has chronic pain in both hands.  DIP synovial thickening noted.  No tenderness or synovitis of MCP joints.  She presents today with pain in the left hand and left wrist joint.  No synovitis was noted.  X-rays of both hands were obtained today, which were unremarkable.  She was encouraged to use voltaren gel topically as needed for pain relief. A refill of voltaren gel was sent to the pharmacy. Plan: XR Hand 2 View Right, XR Hand 2 View Left  Pain in both feet - She has chronic pain in both ankle joints.  No tenderness or synovitis noted.  X-rays of both feet were obtained today.  X-rays were unremarkable. Plan: XR Foot 2 Views Left, XR Foot 2 Views Right.  X-ray showed mild osteoarthritic changes and calcaneal spurs.  Bilateral hip pain - She has good ROM of both hip joints on exam today.  No groin pain.  She has tenderness over bilateral trochanteric bursa.  X-rays of both hip joints were obtained today, which were unremarkable. She was given a handout of exercises to perform. Plan: XR HIPS BILAT W OR W/O PELVIS 3-4 VIEWS.  X-ray of the pelvis was unremarkable.  Trochanteric bursitis of both hips: She has tenderness over trochanteric bursa bilaterally, worse on the right side. She was encouraged to perform stretching exercises daily.  She was  given a handout of exercises to perform.    Other medical conditions are listed as follows:   History of coronary artery disease  S/P laparoscopic sleeve gastrectomy  History of depression  Dyslipidemia  History of vitamin D deficiency  History of hypertension  Solitary pulmonary nodule  History of diastolic dysfunction    Orders: Orders Placed This Encounter  Procedures  . XR Hand 2 View Right  . XR Hand 2 View Left  . XR Foot 2 Views Left  . XR Foot 2 Views Right  . XR HIPS BILAT W OR W/O PELVIS 3-4 VIEWS  . QuantiFERON-TB Gold Plus   Meds ordered this encounter  Medications  . cyclobenzaprine (FLEXERIL) 10 MG tablet    Sig: Take 1 tablet by mouth daily at bedtime as needed for muscle spasms.    Dispense:  90 tablet    Refill:  0  . diclofenac Sodium (VOLTAREN) 1 % GEL    Sig: Apply 2 grams to 4 grams to the affected area up to 4 times daily as needed.    Dispense:  400 g    Refill:  4    Face-to-face time spent with patient was 30 minutes. Greater than 50% of time was spent in counseling and coordination of care.  Follow-Up Instructions: Return in about 5 months (around 12/28/2019) for Rheumatoid arthritis, Fibromyalgia.   Pollyann Savoy, MD   Scribed by-  Sherron Ales, PA-C  Note - This record has been created using Dragon software.  Chart creation errors have been sought, but may not always  have been located. Such creation errors do not reflect on  the standard of medical care.

## 2019-07-31 NOTE — Patient Instructions (Signed)
Hip Bursitis Rehab Ask your health care provider which exercises are safe for you. Do exercises exactly as told by your health care provider and adjust them as directed. It is normal to feel mild stretching, pulling, tightness, or discomfort as you do these exercises. Stop right away if you feel sudden pain or your pain gets worse. Do not begin these exercises until told by your health care provider. Stretching exercise This exercise warms up your muscles and joints and improves the movement and flexibility of your hip. This exercise also helps to relieve pain and stiffness. Iliotibial band stretch An iliotibial band is a strong band of muscle tissue that runs from the outer side of your hip to the outer side of your thigh and knee. 1. Lie on your side with your left / right leg in the top position. 2. Bend your left / right knee and grab your ankle. Stretch out your bottom arm to help you balance. 3. Slowly bring your knee back so your thigh is behind your body. 4. Slowly lower your knee toward the floor until you feel a gentle stretch on the outside of your left / right thigh. If you do not feel a stretch and your knee will not fall farther, place the heel of your other foot on top of your knee and pull your knee down toward the floor with your foot. 5. Hold this position for __________ seconds. 6. Slowly return to the starting position. Repeat __________ times. Complete this exercise __________ times a day. Strengthening exercises These exercises build strength and endurance in your hip and pelvis. Endurance is the ability to use your muscles for a long time, even after they get tired. Bridge This exercise strengthens the muscles that move your thigh backward (hip extensors). 1. Lie on your back on a firm surface with your knees bent and your feet flat on the floor. 2. Tighten your buttocks muscles and lift your buttocks off the floor until your trunk is level with your thighs. ? Do not arch  your back. ? You should feel the muscles working in your buttocks and the back of your thighs. If you do not feel these muscles, slide your feet 1-2 inches (2.5-5 cm) farther away from your buttocks. ? If this exercise is too easy, try doing it with your arms crossed over your chest. 3. Hold this position for __________ seconds. 4. Slowly lower your hips to the starting position. 5. Let your muscles relax completely after each repetition. Repeat __________ times. Complete this exercise __________ times a day. Squats This exercise strengthens the muscles in front of your thigh and knee (quadriceps). 1. Stand in front of a table, with your feet and knees pointing straight ahead. You may rest your hands on the table for balance but not for support. 2. Slowly bend your knees and lower your hips like you are going to sit in a chair. ? Keep your weight over your heels, not over your toes. ? Keep your lower legs upright so they are parallel with the table legs. ? Do not let your hips go lower than your knees. ? Do not bend lower than told by your health care provider. ? If your hip pain increases, do not bend as low. 3. Hold the squat position for __________ seconds. 4. Slowly push with your legs to return to standing. Do not use your hands to pull yourself to standing. Repeat __________ times. Complete this exercise __________ times a day. Hip hike 1. Stand   sideways on a bottom step. Stand on your left / right leg with your other foot unsupported next to the step. You can hold on to the railing or wall for balance if needed. 2. Keep your knees straight and your torso square. Then lift your left / right hip up toward the ceiling. 3. Hold this position for __________ seconds. 4. Slowly let your left / right hip lower toward the floor, past the starting position. Your foot should get closer to the floor. Do not lean or bend your knees. Repeat __________ times. Complete this exercise __________ times a  day. Single leg stand 1. Without shoes, stand near a railing or in a doorway. You may hold on to the railing or door frame as needed for balance. 2. Squeeze your left / right buttock muscles, then lift up your other foot. ? Do not let your left / right hip push out to the side. ? It is helpful to stand in front of a mirror for this exercise so you can watch your hip. 3. Hold this position for __________ seconds. Repeat __________ times. Complete this exercise __________ times a day. This information is not intended to replace advice given to you by your health care provider. Make sure you discuss any questions you have with your health care provider. Document Revised: 09/18/2018 Document Reviewed: 09/18/2018 [employer]vier Patient Education  2020 [employer]  

## 2019-08-07 ENCOUNTER — Ambulatory Visit
Admission: EM | Admit: 2019-08-07 | Discharge: 2019-08-07 | Disposition: A | Discharge status: Home / Self Care | Payer: BC Managed Care – PPO | Attending: Physician Assistant | Admitting: Physician Assistant

## 2019-08-07 DIAGNOSIS — Z20822 Contact with and (suspected) exposure to covid-19: Secondary | ICD-10-CM

## 2019-08-07 DIAGNOSIS — R52 Pain, unspecified: Secondary | ICD-10-CM | POA: Diagnosis not present

## 2019-08-07 DIAGNOSIS — R0981 Nasal congestion: Secondary | ICD-10-CM

## 2019-08-07 MED ORDER — AZELASTINE HCL 0.1 % NA SOLN: 2.0000 | Freq: Two times a day (BID) | NASAL | 0 refills | Status: DC

## 2019-08-07 NOTE — ED Provider Notes (Signed)
[facility]    CSN: [id number] Arrival date & time: 08/07/19  1923      History   Chief Complaint Chief Complaint  Patient presents with  . Generalized Body Aches    HPI [name] [name] is a 54 y.o. female.   55 year old female with history of RA comes in for 2 day history of body aches, fatigue, nasal congestion. Denies cough, sore throat. Denies fever, chills. Denies abdominal pain, nausea, vomiting, diarrhea. Denies shortness of breath, loss of taste/smell. Had positive COVID exposure 2 days ago. Never smoker.      Past Medical History:  Diagnosis Date  . Anxiety   . Bursitis   . Coronary artery calcification seen on CAT scan    minimal CAD with 30% prox and mild LAD  . Depression   . Diastolic dysfunction   . Dyslipidemia 01/30/2015  . Esophageal ring   . Fibromyalgia   . Hiatal hernia   . HSV-1 infection   . Morbid obesity (HCC)   . Osteoarthritis   . Rheumatoid arthritis(714.0)    M05.79  . Rheumatoid arthritis, seropositive, 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HCC)    Treated with Orencia, TB neg 09/12/2015  . Spondylolysis     Patient Active Problem List   Diagnosis Date Noted  . High risk medication use 08/03/2016  . Fibromyalgia syndrome 08/03/2016  . Abnormal cardiac CT angiography   . Sacrococcygeal pain 02/20/2016  . Rheumatoid arthritis involving 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HCC) 01/24/2016  . 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 Dyslipidemia 01/30/2015  . 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 Essential hypertension 08/25/2014  . Morbid obesity (HCC) 08/25/2014  . CAD (coronary artery disease), native coronary artery 08/24/2014  . Numbness and tingling in left arm   . Arm numbness left 08/22/2014  . Chest pain 08/22/2014  . Gastroesophageal reflux disease without esophagitis 07/23/2014  . Coronary artery calcification seen on CAT scan 02/19/2014  . Hip pain 10/23/2013  . Solitary pulmonary nodule  08/28/2013  . Chest pain, atypical 07/18/2013  . Heart palpitations 06/27/2013  . Cough 06/04/2013  . Diastolic dysfunction 05/18/2013  . SOB (shortness of breath) 05/09/2013  . Other malaise and fatigue 01/21/2013  . Stress and adjustment reaction 12/09/2012  . Right sided abdominal pain 11/29/2012  . 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/21/2012  . Morbid obesity with BMI of 50.0-59.9, adult (HCC) 11/21/2012  . Arthritis 07/13/2012  . Depression 07/13/2012  . Routine health maintenance 05/07/2012    Past Surgical History:  Procedure Laterality Date  . CARDIAC CATHETERIZATION N/A 06/11/2016   Procedure: Left Heart Cath and Coronary Angiography;  Surgeon: Corky Crafts, MD;  Location: [facility] INVASIVE CV LAB;  Service: Cardiovascular;  Laterality: N/A;  . CESAREAN SECTION    . COLONOSCOPY  07/27/2017  . ESOPHAGEAL MANOMETRY N/A 10/28/2014   Procedure: ESOPHAGEAL MANOMETRY (EM);  Surgeon: Hilarie Fredrickson, MD;  Location: [facility] ENDOSCOPY;  Service: Endoscopy;  Laterality: N/A;  . HERNIA REPAIR     reports surgery on 3 hernias, with 2 more present  . LAPAROSCOPIC GASTRIC SLEEVE RESECTION  11/21/12   [facility]  . LEFT HEART CATHETERIZATION WITH CORONARY ANGIOGRAM N/A 08/26/2014   Procedure: LEFT HEART CATHETERIZATION WITH CORONARY ANGIOGRAM;  Surgeon: Runell Gess, MD;  Location: [facility] CATH LAB;  Service: Cardiovascular;  Laterality: N/A;  . TUBAL LIGATION      OB History   No obstetric history on file.      Home Medications  Prior to Admission medications   Medication Sig Start Date End Date Taking? Authorizing Provider  Abatacept (ORENCIA IV) Inject 1,000 mg into the vein every 28 (twenty-eight) days.     [provider]  Acetaminophen-Codeine 300-30 MG tablet acetaminophen 300 mg-codeine 30 mg tablet    [provider]  Adapalene 0.3 % gel Apply 1 application topically daily as needed (acne).  12/31/14   [provider]  Alcohol Swabs (ALCOHOL  WIPES) 70 % PADS Alcohol Prep Pads    [provider]  ARIPiprazole (ABILIFY) 2 MG tablet Take 1 tablet (2 mg total) by mouth daily. 04/22/15   Benjaman Pott, MD  azelastine (ASTELIN) 0.1 % nasal spray Place 2 sprays into both nostrils 2 (two) times daily. 08/07/19   Belinda Fisher, PA-C  Betamethasone Valerate 0.12 % foam Apply 1 application topically daily as needed (itching). Itching  04/22/15   [provider]  buPROPion (WELLBUTRIN XL) 150 MG 24 hr tablet Take 450 mg by mouth every morning. 05/23/19   [provider]  buPROPion (WELLBUTRIN XL) 300 MG 24 hr tablet Take 1 tablet (300 mg total) by mouth daily. 12/28/14   Adonis Brook, NP  cetirizine (ZYRTEC) 10 MG tablet Take 10 mg by mouth daily as needed for allergies.  12/14/17   [provider]  cetirizine-pseudoephedrine (ZYRTEC-D ALLERGY & CONGESTION) 5-120 MG tablet Zyrtec-D 5 mg-120 mg tablet,extended release  TAKE 1 TABLET BY MOUTH EVERY 12 HOURS FOR 10 DAYS    [provider]  clindamycin-benzoyl peroxide (BENZACLIN) gel Apply 1 application topically daily as needed (acne).  02/13/15   [provider]  clobetasol cream (TEMOVATE) 0.05 % Apply 1 application topically daily as needed (skin discoloration).  04/22/15   [provider]  colchicine 0.6 MG tablet Take one tablet by mouth twice daily for 2 weeks 04/25/19   Manson Passey, PA  cyclobenzaprine (FLEXERIL) 10 MG tablet Take 1 tablet by mouth daily at bedtime as needed for muscle spasms. 07/31/19   Gearldine Bienenstock, PA-C  diclofenac Sodium (VOLTAREN) 1 % GEL Apply 2 grams to 4 grams to the affected area up to 4 times daily as needed. 07/31/19   Gearldine Bienenstock, PA-C  diltiazem (CARDIZEM CD) 180 MG 24 hr capsule Take 1 capsule (180 mg total) by mouth daily. 04/16/19 07/31/19  Manson Passey, PA  diltiazem (TIAZAC) 180 MG 24 hr capsule Take by mouth. 08/27/13   [provider]  doxepin (SINEQUAN) 25 MG capsule Take 25  mg by mouth at bedtime as needed. 04/28/19   [provider]  EPINEPHrine 0.3 mg/0.3 mL IJ SOAJ injection epinephrine 0.3 mg/0.3 mL injection, auto-injector 07/02/19   [provider]  esomeprazole (NEXIUM) 20 MG capsule Take one tablet by mouth daily for 2 weeks. 04/25/19   Bhagat, Bhavinkumar, PA  FABB 2.2-25-1 MG TABS TAKE 2 TABLETS BY MOUTH EVERY DAY 07/17/19   Pollyann Savoy, MD  famciclovir (FAMVIR) 500 MG tablet Take 15,000 mg by mouth daily as needed (fever blister).  01/21/15   [provider]  fluticasone (FLONASE) 50 MCG/ACT nasal spray Place 1 spray into both nostrils as needed for allergies or rhinitis.    [provider]  folic acid (FOLVITE) 1 MG tablet TAKE 2 TABLETS BY MOUTH EVERY DAY 05/28/19   Pollyann Savoy, MD  gabapentin (NEURONTIN) 100 MG capsule Take 300 mg by mouth daily.    [provider]  gabapentin (NEURONTIN) 300 MG capsule gabapentin 300 mg capsule  TAKE 1 CAPSULE BY MOUTH THREE TIMES A DAY AS NEEDED FOR PAIN    [provider]  hydrOXYzine (VISTARIL) 25 MG capsule Take 25 mg by mouth every 8 (eight) hours as needed for anxiety.  12/30/14   [provider]  ibuprofen (ADVIL) 800 MG tablet ibuprofen 800 mg tablet  TAKE 1 TABLET BY MOUTH THREE TIMES A DAY AS NEEDED    [provider]  levothyroxine (SYNTHROID) 25 MCG tablet levothyroxine 25 mcg tablet  TAKE 1 TABLET IN THE MORNING ON AN EMPTY STOMACH 30 MINUTES BEFORE FOOD    [provider]  lidocaine (LIDODERM) 5 % PLACE 1 PATCH ONTO THE SKIN DAILY. REMOVE & DISCARD PATCH WITHIN 12 HOURS OR AS DIRECTED BY MD 07/05/19   Bo Merino, MD  LORazepam (ATIVAN) 1 MG tablet lorazepam 1 mg tablet  TAKE 1 TABLET BY MOUTH THREE TIMES A DAY AS NEEDED    [provider]  methocarbamol (ROBAXIN) 500 MG tablet methocarbamol 500 mg tablet  TAKE 1 TABLET BY MOUTH TWICE A DAY AS NEEDED FOR SPASMS (DAYTIME)    [provider]   mometasone (NASONEX) 50 MCG/ACT nasal spray 2 sprays by Each Nare route nightly. 07/02/19 07/02/20  [provider]  nitroGLYCERIN (NITROSTAT) 0.4 MG SL tablet Place 1 tablet (0.4 mg total) under the tongue every 5 (five) minutes as needed for chest pain. 05/01/19   Burtis Junes, NP  omeprazole (PRILOSEC) 20 MG capsule omeprazole 20 mg capsule,delayed release    [provider]  Oxymetazoline HCl (NASAL SPRAY) 0.05 % SOLN Nasal Decongestant (oxymetazoline) 0.05 % spray    [provider]  pantoprazole (PROTONIX) 20 MG tablet Take by mouth. 07/02/19   [provider]  promethazine-dextromethorphan (PROMETHAZINE-DM) 6.25-15 MG/5ML syrup promethazine-DM 6.25 mg-15 mg/5 mL oral syrup    [provider]  RASUVO 25 MG/0.5ML SOAJ INJECT ONE PEN SUBCUTANEOUSLY ONCE EVERY WEEK. STORE AT ROOM TEMPERATURE BETWEEN 68 - 78 DEGREES F. Patient taking differently: Inject 25 mg into the skin every 7 (seven) days.  11/03/18   Bo Merino, MD  rosuvastatin (CRESTOR) 20 MG tablet Take 20 mg by mouth. 09/21/16 04/25/19  [provider]  tretinoin (RETIN-A) 0.025 % cream Apply 1 application topically daily as needed (acne).  02/11/15   [provider]  triamcinolone cream (KENALOG) 0.1 % Apply 1 application topically daily as needed (rash). Itching  04/13/15   [provider]  Vitamin D, Ergocalciferol, (DRISDOL) 1.25 MG (50000 UT) CAPS capsule Take 50,000 Units by mouth See admin instructions. Take 1 tablet (50000 units) by mouth every Monday, Wednesday, Saturday    [provider]  XERESE 5-1 % CREA Apply 1 application topically daily as needed (fever blisters).  12/30/14   [provider]    Family History Family History  Problem Relation Age of Onset  . Hyperlipidemia Mother   . Depression Mother   . Sarcoidosis Father   . Lung disease Father        Pleural Mesothelioma  . Cancer Father   . Heart attack Paternal  Grandmother   . Hypertension Paternal Grandmother   . Hypertension Maternal Grandmother   . Diabetes Maternal Grandmother   . Stroke Neg Hx   . Colon cancer Neg Hx   . Esophageal cancer Neg Hx   . Rectal cancer Neg Hx   . Stomach cancer Neg Hx     Social History Social History   Tobacco Use  . Smoking status: Never Smoker  .  Smokeless tobacco: Never Used  Substance Use Topics  . Alcohol use: No  . Drug use: No     Allergies   Influenza vaccines and Isosorbide   Review of Systems Review of Systems  Reason unable to perform ROS: See HPI as above.     Physical Exam Triage Vital Signs ED Triage Vitals [08/07/19 1945]  Enc Vitals Group     BP 123/70     Pulse Rate (!) 105     Resp 18     Temp 98.1 F (36.7 C)     Temp Source Oral     SpO2 98 %     Weight      Height      Head Circumference      Peak Flow      Pain Score 6     Pain Loc      Pain Edu?      Excl. in GC?    No data found.  Updated Vital Signs BP 123/70 (BP Location: Left Arm)   Pulse (!) 105   Temp 98.1 F (36.7 C) (Oral)   Resp 18   SpO2 98%   Physical Exam Constitutional:      General: She is not in acute distress.    Appearance: Normal appearance. She is not ill-appearing, toxic-appearing or diaphoretic.  HENT:     Head: Normocephalic and atraumatic.     Nose:     Right Sinus: Maxillary sinus tenderness and frontal sinus tenderness present.     Left Sinus: Maxillary sinus tenderness and frontal sinus tenderness present.     Mouth/Throat:     Mouth: Mucous membranes are moist.     Pharynx: Oropharynx is clear. Uvula midline.  Cardiovascular:     Rate and Rhythm: Normal rate and regular rhythm.     Heart sounds: Normal heart sounds. No murmur. No friction rub. No gallop.   Pulmonary:     Effort: Pulmonary effort is normal. No accessory muscle usage, prolonged expiration, respiratory distress or retractions.     Comments: Lungs clear to auscultation without adventitious lung  sounds. Musculoskeletal:     Cervical back: Normal range of motion and neck supple.  Neurological:     General: No focal deficit present.     Mental Status: She is alert and oriented to person, place, and time.      UC Treatments / Results  Labs (all labs ordered are listed, but only abnormal results are displayed) Labs Reviewed  NOVEL CORONAVIRUS, NAA    EKG   Radiology No results found.  Procedures Procedures (including critical care time)  Medications Ordered in UC Medications - No data to display  Initial Impression / Assessment and Plan / UC Course  I have reviewed the triage vital signs and the nursing notes.  Pertinent labs & imaging results that were available during my care of the patient were reviewed by me and considered in my medical decision making (see chart for details).    Discussed current COVID testing will not evaluate for recent exposure. Patient expresses understanding and would still like to proceed with testing. COVID PCR test ordered. Patient to quarantine until testing results return. No alarming signs on exam.  Patient speaking in full sentences without respiratory distress.  Symptomatic treatment discussed.  Push fluids.  Return precautions given.  Patient expresses understanding and agrees to plan.  Given patient's history, to have patient inform rheumatologist, PCP of current symptoms.  Final Clinical Impressions(s) / UC Diagnoses  Final diagnoses:  Nasal congestion  Body aches   ED Prescriptions    Medication Sig Dispense Auth. Provider   azelastine (ASTELIN) 0.1 % nasal spray Place 2 sprays into both nostrils 2 (two) times daily. 30 mL Belinda Fisher, PA-C     PDMP not reviewed this encounter.   Belinda Fisher, PA-C 08/07/19 2006

## 2019-08-07 NOTE — ED Triage Notes (Signed)
Pt c/o body aches, fatigue, and nasal congestion since yesterday. States had a positive exposure 2 days ago. Pt states she has RA but this feels different. Pt requesting COVID testing

## 2019-08-07 NOTE — Discharge Instructions (Addendum)
COVID PCR testing ordered. I would like you to quarantine until testing results. As discussed current exposure does not help determine recent exposure. Please still quarantine and monitor symptoms. Add azelastine nasal spray. Tylenol/motrin for pain and fever. Keep hydrated, urine should be clear to pale yellow in color. If experiencing shortness of breath, trouble breathing, go to the emergency department for further evaluation needed.  To make appointment: You can text "COVID" to [phone] OR log on to [email or web address] If no smart phone or PC, you can call [phone] for assistance in setting up appointments.  COVID drive through sites:  [place]: [address] [place]: [address] [place]: [address]

## 2019-08-09 LAB — NOVEL CORONAVIRUS, NAA: SARS-CoV-2, NAA: NOT DETECTED

## 2019-08-10 ENCOUNTER — Other Ambulatory Visit: Payer: Self-pay | Admitting: *Deleted

## 2019-08-10 NOTE — Progress Notes (Signed)
Infusion orders are current for patient CBC CMP Tylenol Benadryl appointments are up to date and follow up appointment  is scheduled TB gold due and order placed.  

## 2019-08-11 ENCOUNTER — Ambulatory Visit
Admission: EM | Admit: 2019-08-11 | Discharge: 2019-08-11 | Discharge status: Absent without leave | Payer: BC Managed Care – PPO

## 2019-08-16 ENCOUNTER — Other Ambulatory Visit: Payer: Self-pay | Admitting: Rheumatology

## 2019-08-17 NOTE — Telephone Encounter (Signed)
Please schedule patient for a follow up visit. Patient due July 2021. Thanks!  

## 2019-08-17 NOTE — Telephone Encounter (Signed)
Last Visit: 07/31/19 Next Visit due July 2021. Message sent to the front to schedule.   Okay to refill per Dr. Deveshwar  

## 2019-08-17 NOTE — Telephone Encounter (Signed)
Called patient to schedule follow-up appointment.  Patient states she will have to call back to schedule at a later date.

## 2019-08-20 ENCOUNTER — Other Ambulatory Visit: Payer: Self-pay

## 2019-08-20 ENCOUNTER — Ambulatory Visit (HOSPITAL_COMMUNITY)
Admission: RE | Admit: 2019-08-20 | Discharge: 2019-08-20 | Disposition: A | Discharge status: Home / Self Care | Payer: BC Managed Care – PPO | Source: Ambulatory Visit | Attending: Rheumatology | Admitting: Rheumatology

## 2019-08-20 DIAGNOSIS — M0579 Rheumatoid arthritis with rheumatoid factor of multiple sites without organ or systems involvement: Secondary | ICD-10-CM | POA: Diagnosis not present

## 2019-08-20 LAB — COMPREHENSIVE METABOLIC PANEL
ALT: 16 U/L (ref 0–44)
AST: 19 U/L (ref 15–41)
Albumin: 3.4 g/dL — ABNORMAL LOW (ref 3.5–5.0)
Alkaline Phosphatase: 66 U/L (ref 38–126)
Anion gap: 5 (ref 5–15)
BUN: 12 mg/dL (ref 6–20)
CO2: 28 mmol/L (ref 22–32)
Calcium: 9 mg/dL (ref 8.9–10.3)
Chloride: 109 mmol/L (ref 98–111)
Creatinine, Ser: 0.92 mg/dL (ref 0.44–1.00)
GFR calc Af Amer: >60 mL/min (ref >60)
GFR calc non Af Amer: >60 mL/min (ref >60)
Glucose, Bld: 91 mg/dL (ref 70–99)
Potassium: 3.6 mmol/L (ref 3.5–5.1)
Sodium: 142 mmol/L (ref 135–145)
Total Bilirubin: 0.6 mg/dL (ref 0.3–1.2)
Total Protein: 6.8 g/dL (ref 6.5–8.1)

## 2019-08-20 LAB — CBC
HCT: 46.7 % — ABNORMAL HIGH (ref 36.0–46.0)
Hemoglobin: 14.1 g/dL (ref 12.0–15.0)
MCH: 26.4 pg (ref 26.0–34.0)
MCHC: 30.2 g/dL (ref 30.0–36.0)
MCV: 87.5 fL (ref 80.0–100.0)
Platelets: 260 10*3/uL (ref 150–400)
RBC: 5.34 MIL/uL — ABNORMAL HIGH (ref 3.87–5.11)
RDW: 12.3 % (ref 11.5–15.5)
WBC: 7.6 10*3/uL (ref 4.0–10.5)
nRBC: 0 % (ref 0.0–0.2)

## 2019-08-20 MED ORDER — SODIUM CHLORIDE 0.9 % IV SOLN
1000.0000 mg | INTRAVENOUS | Status: DC
  Administered 2019-08-20: 1000 mg via INTRAVENOUS
  Filled 2019-08-20: qty 40

## 2019-08-20 MED ORDER — ACETAMINOPHEN 325 MG PO TABS
ORAL_TABLET | ORAL | Status: AC
  Administered 2019-08-20: 650 mg via ORAL
  Filled 2019-08-20: qty 2

## 2019-08-20 MED ORDER — DIPHENHYDRAMINE HCL 25 MG PO CAPS: 25.0000 mg | ORAL_CAPSULE | ORAL | Status: DC

## 2019-08-20 MED ORDER — DIPHENHYDRAMINE HCL 25 MG PO CAPS
ORAL_CAPSULE | ORAL | Status: AC
  Administered 2019-08-20: 25 mg via ORAL
  Filled 2019-08-20: qty 1

## 2019-08-20 MED ORDER — ACETAMINOPHEN 325 MG PO TABS: 650.0000 mg | ORAL_TABLET | ORAL | Status: DC

## 2019-08-20 NOTE — Progress Notes (Signed)
Labs are stable.  No change in therapy advised.

## 2019-08-22 LAB — QUANTIFERON-TB GOLD PLUS: QuantiFERON-TB Gold Plus: NEGATIVE

## 2019-08-22 LAB — QUANTIFERON-TB GOLD PLUS (RQFGPL)
QuantiFERON Mitogen Value: 6.08 IU/mL
QuantiFERON Nil Value: 0.06 IU/mL
QuantiFERON TB1 Ag Value: 0.06 IU/mL
QuantiFERON TB2 Ag Value: 0.07 IU/mL

## 2019-08-22 NOTE — Progress Notes (Signed)
TB gold is negative.

## 2019-09-05 ENCOUNTER — Telehealth: Payer: Self-pay | Admitting: Rheumatology

## 2019-09-05 MED ORDER — PREDNISONE 5 MG PO TABS: ORAL_TABLET | ORAL | 0 refills | Status: DC

## 2019-09-05 NOTE — Telephone Encounter (Signed)
Patient states she is swollen in ankles, fingers and wrists. Patient states she is having a bad flare. Patient states she has been unable to sleep in the last 4 days. Patient states she is taking Rasuvo weekly and had her last Orencia infusion 08/20/18. Patient requesting a prednisone taper. Please advise.   FYI: "patient's son died tragically in a car accident last week."

## 2019-09-05 NOTE — Telephone Encounter (Signed)
Attempted to contact the patient and left message for patient to verify pharmacy she would like her prescription sent to.

## 2019-09-05 NOTE — Telephone Encounter (Signed)
Patient's Aunt Syna called requesting prescription of Prednisone for "all over joint pain patient is experiencing."  Syna stated she received a text message from patient requesting she call in to the office to request prescription.  Syna stated "patient's son died tragically in a car accident last week."

## 2019-09-05 NOTE — Telephone Encounter (Signed)
Ok to send in prednisone taper starting at 20 mg tapering by 5 mg every 4 days.

## 2019-09-05 NOTE — Telephone Encounter (Signed)
Spoke with patient and she states she would like prescription sent to [facility]. Prescription sent to the pharmacy.

## 2019-09-12 ENCOUNTER — Emergency Department (HOSPITAL_BASED_OUTPATIENT_CLINIC_OR_DEPARTMENT_OTHER)
Admission: EM | Admit: 2019-09-12 | Discharge: 2019-09-12 | Disposition: A | Discharge status: Home / Self Care | Payer: BC Managed Care – PPO | Attending: Emergency Medicine | Admitting: Emergency Medicine

## 2019-09-12 ENCOUNTER — Encounter (HOSPITAL_BASED_OUTPATIENT_CLINIC_OR_DEPARTMENT_OTHER): Payer: Self-pay | Admitting: Emergency Medicine

## 2019-09-12 DIAGNOSIS — I2510 Atherosclerotic heart disease of native coronary artery without angina pectoris: Secondary | ICD-10-CM | POA: Insufficient documentation

## 2019-09-12 DIAGNOSIS — F439 Reaction to severe stress, unspecified: Secondary | ICD-10-CM

## 2019-09-12 DIAGNOSIS — Z79899 Other long term (current) drug therapy: Secondary | ICD-10-CM | POA: Diagnosis not present

## 2019-09-12 DIAGNOSIS — R079 Chest pain, unspecified: Secondary | ICD-10-CM | POA: Insufficient documentation

## 2019-09-12 DIAGNOSIS — Z8659 Personal history of other mental and behavioral disorders: Secondary | ICD-10-CM

## 2019-09-12 MED ORDER — DIAZEPAM 2 MG PO TABS
2.0000 mg | ORAL_TABLET | Freq: Once | ORAL | Status: AC
  Administered 2019-09-12: 2 mg via ORAL
  Filled 2019-09-12: qty 1

## 2019-09-12 NOTE — ED Triage Notes (Signed)
Pt is here with her daughter which is a pt in room 4  Pt states she started having chest pain that radiates up into her right shoulder  Pt states her son was killed in a car accident 2 weeks ago  Pt states she got upset being here with her daughter   Pt is slow to answer questions and follow commands   Pt is tearful

## 2019-09-12 NOTE — ED Provider Notes (Signed)
[facility] Provider Note  CSN: [id number] Arrival date & time: 09/12/19 6948  Chief Complaint(s) Chest Pain  HPI [name] [name] is a 54 y.o. female with a past medical history listed below recently lost her son in a car accident 2 weeks ago and was here in the emergency department with her daughter who came in for symptoms consistent with gastroenteritis/food poisoning.  While I was evaluating the daughter, the patient began discussing the loss of her son.  She was informing us of how difficult it has been because she feels jittery inside.  Shortly thereafter she developed chest discomfort that radiated up to the right chest.  When asked to describe the pain she repeats that she feels like her insides are shaking.  This episode lasted a few minutes improved without intervention.  No shortness of breath.  Pain is nonexertional.  No recent fevers or infections.  No associated nausea or vomiting.  No other physical complaints.  Patient stated that she is already in touch with hospice and will reach out for assistance with grief counseling.  Patient is already established with cardiology and has had extensive cardiac work-up including catheterization performed 3 years ago which showed minimal CAD.   HPI  Past Medical History Past Medical History:  Diagnosis Date  . Anxiety   . Bursitis   . Coronary artery calcification seen on CAT scan    minimal CAD with 30% prox and mild LAD  . Depression   . Diastolic dysfunction   . Dyslipidemia 01/30/2015  . Esophageal ring   . Fibromyalgia   . Hiatal hernia   . HSV-1 infection   . Morbid obesity ([place])   . Osteoarthritis   . Rheumatoid arthritis(714.0)    M05.79  . Rheumatoid arthritis, seropositive, 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[place])    Treated with Orencia, TB neg 09/12/2015  . Spondylolysis    Patient Active Problem List   Diagnosis Date Noted  . High risk medication use 08/03/2016  . Fibromyalgia syndrome  08/03/2016  . Abnormal cardiac CT angiography   . Sacrococcygeal pain 02/20/2016  . Rheumatoid arthritis involving 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[place]) 01/24/2016  . 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 Dyslipidemia 01/30/2015  . MDD (major depressive disorder), recurrent severe, without psychosis ([place]) 12/22/2014  . Essential hypertension 08/25/2014  . Morbid obesity ([place]) 08/25/2014  . CAD (coronary artery disease), native coronary artery 08/24/2014  . Numbness and tingling in left arm   . Arm numbness left 08/22/2014  . Chest pain 08/22/2014  . Gastroesophageal reflux disease without esophagitis 07/23/2014  . Coronary artery calcification seen on CAT scan 02/19/2014  . Hip pain 10/23/2013  . Solitary pulmonary nodule 08/28/2013  . Chest pain, atypical 07/18/2013  . Heart palpitations 06/27/2013  . Cough 06/04/2013  . Diastolic dysfunction 54/62/7035  . SOB (shortness of breath) 05/09/2013  . Other malaise and fatigue 01/21/2013  . Stress and adjustment reaction 12/09/2012  . Right sided abdominal pain 11/29/2012  . 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/21/2012  . Morbid obesity with BMI of 50.0-59.9, adult ([place]) 11/21/2012  . Arthritis 07/13/2012  . Depression 07/13/2012  . Routine health maintenance 05/07/2012   Home Medication(s) Prior to Admission medications   Medication Sig Start Date End Date Taking? Authorizing Provider  Abatacept (ORENCIA IV) Inject 1,000 mg into the vein every 28 (twenty-eight) days.     [provider]  Acetaminophen-Codeine 300-30 MG tablet acetaminophen 300 mg-codeine 30 mg tablet    [provider]  Adapalene 0.3 %  gel Apply 1 application topically daily as needed (acne).  12/31/14   [provider]  Alcohol Swabs (ALCOHOL WIPES) 70 % PADS Alcohol Prep Pads    [provider]  ARIPiprazole (ABILIFY) 2 MG tablet Take 1 tablet (2 mg total) by mouth daily. 04/22/15   Benjaman Pott, MD  azelastine (ASTELIN) 0.1 %  nasal spray Place 2 sprays into both nostrils 2 (two) times daily. 08/07/19   Belinda Fisher, PA-C  Betamethasone Valerate 0.12 % foam Apply 1 application topically daily as needed (itching). Itching  04/22/15   [provider]  buPROPion (WELLBUTRIN XL) 150 MG 24 hr tablet Take 450 mg by mouth every morning. 05/23/19   [provider]  buPROPion (WELLBUTRIN XL) 300 MG 24 hr tablet Take 1 tablet (300 mg total) by mouth daily. 12/28/14   Adonis Brook, NP  cetirizine (ZYRTEC) 10 MG tablet Take 10 mg by mouth daily as needed for allergies.  12/14/17   [provider]  cetirizine-pseudoephedrine (ZYRTEC-D ALLERGY & CONGESTION) 5-120 MG tablet Zyrtec-D 5 mg-120 mg tablet,extended release  TAKE 1 TABLET BY MOUTH EVERY 12 HOURS FOR 10 DAYS    [provider]  clindamycin-benzoyl peroxide (BENZACLIN) gel Apply 1 application topically daily as needed (acne).  02/13/15   [provider]  clobetasol cream (TEMOVATE) 0.05 % Apply 1 application topically daily as needed (skin discoloration).  04/22/15   [provider]  colchicine 0.6 MG tablet Take one tablet by mouth twice daily for 2 weeks 04/25/19   Manson Passey, PA  cyclobenzaprine (FLEXERIL) 10 MG tablet Take 1 tablet by mouth daily at bedtime as needed for muscle spasms. 07/31/19   Gearldine Bienenstock, PA-C  diclofenac Sodium (VOLTAREN) 1 % GEL Apply 2 grams to 4 grams to the affected area up to 4 times daily as needed. 07/31/19   Gearldine Bienenstock, PA-C  diltiazem (CARDIZEM CD) 180 MG 24 hr capsule Take 1 capsule (180 mg total) by mouth daily. 04/16/19 07/31/19  Manson Passey, PA  diltiazem (TIAZAC) 180 MG 24 hr capsule Take by mouth. 08/27/13   [provider]  doxepin (SINEQUAN) 25 MG capsule Take 25 mg by mouth at bedtime as needed. 04/28/19   [provider]  EPINEPHrine 0.3 mg/0.3 mL IJ SOAJ injection epinephrine 0.3 mg/0.3 mL injection, auto-injector 07/02/19   [provider]   esomeprazole (NEXIUM) 20 MG capsule Take one tablet by mouth daily for 2 weeks. 04/25/19   Bhagat, Bhavinkumar, PA  FABB 2.2-25-1 MG TABS TAKE 2 TABLETS BY MOUTH EVERY DAY 07/17/19   Pollyann Savoy, MD  famciclovir (FAMVIR) 500 MG tablet Take 15,000 mg by mouth daily as needed (fever blister).  01/21/15   [provider]  fluticasone (FLONASE) 50 MCG/ACT nasal spray Place 1 spray into both nostrils as needed for allergies or rhinitis.    [provider]  folic acid (FOLVITE) 1 MG tablet TAKE 2 TABLETS BY MOUTH EVERY DAY 05/28/19   Pollyann Savoy, MD  gabapentin (NEURONTIN) 100 MG capsule Take 300 mg by mouth daily.    [provider]  gabapentin (NEURONTIN) 300 MG capsule gabapentin 300 mg capsule  TAKE 1 CAPSULE BY MOUTH THREE TIMES A DAY AS NEEDED FOR PAIN    [provider]  hydrOXYzine (VISTARIL) 25 MG capsule Take 25 mg by mouth every 8 (eight) hours as needed for anxiety.  12/30/14   [provider]  ibuprofen (ADVIL) 800 MG tablet ibuprofen 800 mg tablet  TAKE  1 TABLET BY MOUTH THREE TIMES A DAY AS NEEDED    [provider]  levothyroxine (SYNTHROID) 25 MCG tablet levothyroxine 25 mcg tablet  TAKE 1 TABLET IN THE MORNING ON AN EMPTY STOMACH 30 MINUTES BEFORE FOOD    [provider]  lidocaine (LIDODERM) 5 % PLACE 1 PATCH ONTO THE SKIN DAILY. REMOVE & DISCARD PATCH WITHIN 12 HOURS OR AS DIRECTED BY MD 08/17/19   Pollyann Savoy, MD  LORazepam (ATIVAN) 1 MG tablet lorazepam 1 mg tablet  TAKE 1 TABLET BY MOUTH THREE TIMES A DAY AS NEEDED    [provider]  methocarbamol (ROBAXIN) 500 MG tablet methocarbamol 500 mg tablet  TAKE 1 TABLET BY MOUTH TWICE A DAY AS NEEDED FOR SPASMS (DAYTIME)    [provider]  mometasone (NASONEX) 50 MCG/ACT nasal spray 2 sprays by Each Nare route nightly. 07/02/19 07/02/20  [provider]  nitroGLYCERIN (NITROSTAT) 0.4 MG SL tablet Place 1 tablet (0.4 mg total) under  the tongue every 5 (five) minutes as needed for chest pain. 05/01/19   Rosalio Macadamia, NP  omeprazole (PRILOSEC) 20 MG capsule omeprazole 20 mg capsule,delayed release    [provider]  Oxymetazoline HCl (NASAL SPRAY) 0.05 % SOLN Nasal Decongestant (oxymetazoline) 0.05 % spray    [provider]  pantoprazole (PROTONIX) 20 MG tablet Take by mouth. 07/02/19   [provider]  predniSONE (DELTASONE) 5 MG tablet Take 4 tabs po x 4 days, 3  tabs po x 4 days, 2  tabs po x 4 days, 1  tab po x 4 days 09/05/19   Pollyann Savoy, MD  promethazine-dextromethorphan (PROMETHAZINE-DM) 6.25-15 MG/5ML syrup promethazine-DM 6.25 mg-15 mg/5 mL oral syrup    [provider]  RASUVO 25 MG/0.5ML SOAJ INJECT ONE PEN SUBCUTANEOUSLY ONCE EVERY WEEK. STORE AT ROOM TEMPERATURE BETWEEN 68 - 77 DEGREES F. Patient taking differently: Inject 25 mg into the skin every 7 (seven) days.  11/03/18   Pollyann Savoy, MD  rosuvastatin (CRESTOR) 20 MG tablet Take 20 mg by mouth. 09/21/16 04/25/19  [provider]  tretinoin (RETIN-A) 0.025 % cream Apply 1 application topically daily as needed (acne).  02/11/15   [provider]  triamcinolone cream (KENALOG) 0.1 % Apply 1 application topically daily as needed (rash). Itching  04/13/15   [provider]  Vitamin D, Ergocalciferol, (DRISDOL) 1.25 MG (50000 UT) CAPS capsule Take 50,000 Units by mouth See admin instructions. Take 1 tablet (50000 units) by mouth every Monday, Wednesday, Saturday    [provider]  XERESE 5-1 % CREA Apply 1 application topically daily as needed (fever blisters).  12/30/14   [provider]                                                                                                                                    Past Surgical History Past Surgical History:  Procedure Laterality Date  .  CARDIAC CATHETERIZATION N/A 06/11/2016   Procedure: Left Heart Cath and Coronary  Angiography;  Surgeon: Corky Crafts, MD;  Location: [facility] INVASIVE CV LAB;  Service: Cardiovascular;  Laterality: N/A;  . CESAREAN SECTION    . COLONOSCOPY  07/27/2017  . ESOPHAGEAL MANOMETRY N/A 10/28/2014   Procedure: ESOPHAGEAL MANOMETRY (EM);  Surgeon: Hilarie Fredrickson, MD;  Location: [facility] ENDOSCOPY;  Service: Endoscopy;  Laterality: N/A;  . HERNIA REPAIR     reports surgery on 3 hernias, with 2 more present  . LAPAROSCOPIC GASTRIC SLEEVE RESECTION  11/21/12   [facility]  . LEFT HEART CATHETERIZATION WITH CORONARY ANGIOGRAM N/A 08/26/2014   Procedure: LEFT HEART CATHETERIZATION WITH CORONARY ANGIOGRAM;  Surgeon: Runell Gess, MD;  Location: [facility] CATH LAB;  Service: Cardiovascular;  Laterality: N/A;  . TUBAL LIGATION     Family History Family History  Problem Relation Age of Onset  . Hyperlipidemia Mother   . Depression Mother   . Sarcoidosis Father   . Lung disease Father        Pleural Mesothelioma  . Cancer Father   . Heart attack Paternal Grandmother   . Hypertension Paternal Grandmother   . Hypertension Maternal Grandmother   . Diabetes Maternal Grandmother   . Stroke Neg Hx   . Colon cancer Neg Hx   . Esophageal cancer Neg Hx   . Rectal cancer Neg Hx   . Stomach cancer Neg Hx     Social History Social History   Tobacco Use  . Smoking status: Never Smoker  . Smokeless tobacco: Never Used  Substance Use Topics  . Alcohol use: No  . Drug use: No   Allergies Influenza vaccines and Isosorbide  Review of Systems Review of Systems All other systems are reviewed and are negative for acute change except as noted in the HPI  Physical Exam Vital Signs  I have reviewed the triage vital signs BP 109/70 (BP Location: Right Arm)   Pulse 67   Temp 98.1 F (36.7 C) (Oral)   Resp 17   Ht  (1.575 m)   Wt 117.9 kg   SpO2 97%   BMI 47.55 kg/m   Physical Exam Vitals reviewed.  Constitutional:      General: She is not in acute distress.    Appearance: She is  well-developed. She is morbidly obese. She is not diaphoretic.  HENT:     Head: Normocephalic and atraumatic.     Nose: Nose normal.  Eyes:     General: No scleral icterus.       Right eye: No discharge.        Left eye: No discharge.     Conjunctiva/sclera: Conjunctivae normal.     Pupils: Pupils are equal, round, and reactive to light.  Cardiovascular:     Rate and Rhythm: Normal rate and regular rhythm.     Heart sounds: No murmur. No friction rub. No gallop.   Pulmonary:     Effort: Pulmonary effort is normal. No respiratory distress.     Breath sounds: Normal breath sounds. No stridor. No rales.  Abdominal:     General: There is no distension.     Palpations: Abdomen is soft.     Tenderness: There is no abdominal tenderness.  Musculoskeletal:        General: No tenderness.     Cervical back: Normal range of motion and neck supple.  Skin:    General: Skin is warm and dry.     Findings:  No erythema or rash.  Neurological:     Mental Status: She is alert and oriented to person, place, and time.     ED Results and Treatments Labs (all labs ordered are listed, but only abnormal results are displayed) Labs Reviewed - No data to display                                                                                                                       EKG  EKG Interpretation  Date/Time:  Wednesday September 12 2019 05:37:31 EDT Ventricular Rate:  67 PR Interval:    QRS Duration: 98 QT Interval:  419 QTC Calculation: 443 R Axis:   79 Text Interpretation: Sinus rhythm Low voltage, precordial leads Baseline wander in lead(s) V2 No acute changes Confirmed by Drema Pry [phone]) on 09/12/2019 6:08:56 AM      Radiology No results found.  Pertinent labs & imaging results that were available during my care of the patient were reviewed by me and considered in my medical decision making (see chart for details).  Medications Ordered in ED Medications  diazepam (VALIUM) tablet  2 mg (2 mg Oral Given 09/12/19 0604)                                                                                                                                    Procedures Procedures  (including critical care time)  Medical Decision Making / ED Course I have reviewed the nursing notes for this encounter and the patient's prior records (if available in EHR or on provided paperwork).   [name] [name] was evaluated in Emergency Department on 09/12/2019 for the symptoms described in the history of present illness. She was evaluated in the context of the global COVID-19 pandemic, which necessitated consideration that the patient might be at risk for infection with the SARS-CoV-2 virus that causes COVID-19. Institutional protocols and algorithms that pertain to the evaluation of patients at risk for COVID-19 are in a state of rapid change based on information released by regulatory bodies including the CDC and federal and state organizations. These policies and algorithms were followed during the patient's care in the ED.  EKG without acute ischemic changes or pericarditis.  Presentation is felt to be related to emotional stress.  I have a low suspicion for ACS.  Low suspicion for PE, dissection.  Will provide patient with low-dose oral Valium and monitor.  At  this time I do not feel that additional diagnostic work-up is warranted.  Patient reported significant improvement.      Final Clinical Impression(s) / ED Diagnoses Final diagnoses:  History of recent stressful life event  Chest pain due to psychological stress    The patient appears reasonably screened and/or stabilized for discharge and I doubt any other medical condition or other [facility] requiring further screening, evaluation, or treatment in the ED at this time prior to discharge. Safe for discharge with strict return precautions.  Disposition: Discharge  Condition: Good  I have discussed the results, Dx and Tx plan with  the patient/family who expressed understanding and agree(s) with the plan. Discharge instructions discussed at length. The patient/family was given strict return precautions who verbalized understanding of the instructions. No further questions at time of discharge.    ED Discharge Orders    None       Follow Up: [name], MD [address] [place] [address] [phone]        This chart was dictated using voice recognition software.  Despite best efforts to proofread,  errors can occur which can change the documentation meaning.   Nira Conn, MD 09/12/19 [phone]

## 2019-09-18 ENCOUNTER — Other Ambulatory Visit: Payer: Self-pay | Admitting: *Deleted

## 2019-09-18 DIAGNOSIS — M0579 Rheumatoid arthritis with rheumatoid factor of multiple sites without organ or systems involvement: Secondary | ICD-10-CM

## 2019-09-20 ENCOUNTER — Inpatient Hospital Stay (HOSPITAL_COMMUNITY): Admission: RE | Admit: 2019-09-20 | Payer: BC Managed Care – PPO | Source: Ambulatory Visit

## 2019-10-09 ENCOUNTER — Ambulatory Visit (INDEPENDENT_AMBULATORY_CARE_PROVIDER_SITE_OTHER)
Admission: RE | Admit: 2019-10-09 | Discharge: 2019-10-09 | Disposition: A | Discharge status: Home / Self Care | Payer: BC Managed Care – PPO | Source: Ambulatory Visit

## 2019-10-09 ENCOUNTER — Other Ambulatory Visit: Payer: Self-pay

## 2019-10-09 DIAGNOSIS — F411 Generalized anxiety disorder: Secondary | ICD-10-CM

## 2019-10-09 DIAGNOSIS — F430 Acute stress reaction: Secondary | ICD-10-CM | POA: Diagnosis not present

## 2019-10-09 DIAGNOSIS — F410 Panic disorder [episodic paroxysmal anxiety] without agoraphobia: Secondary | ICD-10-CM

## 2019-10-09 MED ORDER — LORAZEPAM 1 MG PO TABS: 1.0000 mg | ORAL_TABLET | Freq: Three times a day (TID) | ORAL | 0 refills | Status: DC | PRN

## 2019-10-09 MED ORDER — FLUTICASONE PROPIONATE 50 MCG/ACT NA SUSP: 2.0000 | Freq: Every day | NASAL | 2 refills | Status: DC

## 2019-10-09 NOTE — Discharge Instructions (Signed)
I would have you take the Ativan one tablet 3 times per day as needed for anxiety and panic attacks.  I would also have you follow up with your psychiatrist and counselor.

## 2019-10-10 ENCOUNTER — Telehealth: Payer: Self-pay | Admitting: Nurse Practitioner

## 2019-10-10 NOTE — Telephone Encounter (Signed)
Left message to call office
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
## 2019-10-10 NOTE — Telephone Encounter (Signed)
New Message   Patient sent a mychart message to schedule an appointment with Norma Fredrickson for chest pain on any Wednesday after today. I was able to schedule the appt for 5/26 but since she mentioned chest pain I am forwarding a message for the patient to be contacted.

## 2019-10-11 ENCOUNTER — Ambulatory Visit
Admission: EM | Admit: 2019-10-11 | Discharge: 2019-10-11 | Disposition: A | Discharge status: Home / Self Care | Payer: BC Managed Care – PPO

## 2019-10-11 ENCOUNTER — Encounter: Payer: Self-pay | Admitting: Emergency Medicine

## 2019-10-11 DIAGNOSIS — R519 Headache, unspecified: Secondary | ICD-10-CM | POA: Diagnosis not present

## 2019-10-11 MED ORDER — PREDNISONE 50 MG PO TABS: 50.0000 mg | ORAL_TABLET | Freq: Every day | ORAL | 0 refills | Status: DC

## 2019-10-11 NOTE — Discharge Instructions (Signed)
Prednisone for sinus headache. Zyrtec if needed. Follow up with PCP

## 2019-10-11 NOTE — Telephone Encounter (Signed)
Left message for patient to call back  

## 2019-10-11 NOTE — ED Triage Notes (Signed)
PT reports severe headache for a few days. Sinus pressure as well.   PT's son was killed in a car accident 6 weeks ago. She reports extreme stress.

## 2019-10-11 NOTE — ED Provider Notes (Signed)
[facility]    CSN: [id number] Arrival date & time: 10/11/19  1925      History   Chief Complaint Chief Complaint  Patient presents with  . Headache    HPI [name] [name] is a 54 y.o. female.   54 year old female comes in for few day history of sinus pressure/headache. Has been dealing with increased stress due to son passing away 6 weeks ago. She feels that she is sleeping, but will wake up feeling like she has not had quality sleep. Has intermittent nasal congestion/rhinorrhea when being outside. Denies fever. Feels that she has good support system with psychiatrist/thereapist. Thinking of getting grief conseling.      Past Medical History:  Diagnosis Date  . Anxiety   . Bursitis   . Coronary artery calcification seen on CAT scan    minimal CAD with 30% prox and mild LAD  . Depression   . Diastolic dysfunction   . Dyslipidemia 01/30/2015  . Esophageal ring   . Fibromyalgia   . Hiatal hernia   . HSV-1 infection   . Morbid obesity ([place])   . Osteoarthritis   . Rheumatoid arthritis(714.0)    M05.79  . Rheumatoid arthritis, seropositive, 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[place])    Treated with Orencia, TB neg 09/12/2015  . Spondylolysis     Patient Active Problem List   Diagnosis Date Noted  . High risk medication use 08/03/2016  . Fibromyalgia syndrome 08/03/2016  . Abnormal cardiac CT angiography   . Sacrococcygeal pain 02/20/2016  . Rheumatoid arthritis involving 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[place]) 01/24/2016  . 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 Dyslipidemia 01/30/2015  . MDD (major depressive disorder), recurrent severe, without psychosis ([place]) 12/22/2014  . Essential hypertension 08/25/2014  . Morbid obesity ([place]) 08/25/2014  . CAD (coronary artery disease), native coronary artery 08/24/2014  . Numbness and tingling in left arm   . Arm numbness left 08/22/2014  . Chest pain 08/22/2014  . Gastroesophageal reflux disease without esophagitis 07/23/2014  . Coronary  artery calcification seen on CAT scan 02/19/2014  . Hip pain 10/23/2013  . Solitary pulmonary nodule 08/28/2013  . Chest pain, atypical 07/18/2013  . Heart palpitations 06/27/2013  . Cough 06/04/2013  . Diastolic dysfunction 24/40/1027  . SOB (shortness of breath) 05/09/2013  . Other malaise and fatigue 01/21/2013  . Stress and adjustment reaction 12/09/2012  . Right sided abdominal pain 11/29/2012  . 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/21/2012  . Morbid obesity with BMI of 50.0-59.9, adult ([place]) 11/21/2012  . Arthritis 07/13/2012  . Depression 07/13/2012  . Routine health maintenance 05/07/2012    Past Surgical History:  Procedure Laterality Date  . CARDIAC CATHETERIZATION N/A 06/11/2016   Procedure: Left Heart Cath and Coronary Angiography;  Surgeon: Jettie Booze, MD;  Location: [facility];  Service: Cardiovascular;  Laterality: N/A;  . CESAREAN SECTION    . COLONOSCOPY  07/27/2017  . ESOPHAGEAL MANOMETRY N/A 10/28/2014   Procedure: ESOPHAGEAL MANOMETRY (EM);  Surgeon: Irene Shipper, MD;  Location: [facility];  Service: Endoscopy;  Laterality: N/A;  . HERNIA REPAIR     reports surgery on 3 hernias, with 2 more present  . LAPAROSCOPIC GASTRIC SLEEVE RESECTION  11/21/12   [facility]  . LEFT HEART CATHETERIZATION WITH CORONARY ANGIOGRAM N/A 08/26/2014   Procedure: LEFT HEART CATHETERIZATION WITH CORONARY ANGIOGRAM;  Surgeon: Lorretta Harp, MD;  Location: [facility] CATH LAB;  Service: Cardiovascular;  Laterality: N/A;  . TUBAL LIGATION  OB History   No obstetric history on file.      Home Medications    Prior to Admission medications   Medication Sig Start Date End Date Taking? Authorizing Provider  diltiazem (TIAZAC) 180 MG 24 hr capsule Take by mouth. 08/27/13  Yes [provider]  propranolol (INDERAL) 10 MG tablet Take 10 mg by mouth daily.   Yes [provider]  Abatacept (ORENCIA IV) Inject 1,000 mg into the vein every 28  (twenty-eight) days.     [provider]  Acetaminophen-Codeine 300-30 MG tablet acetaminophen 300 mg-codeine 30 mg tablet    [provider]  Adapalene 0.3 % gel Apply 1 application topically daily as needed (acne).  12/31/14   [provider]  Alcohol Swabs (ALCOHOL WIPES) 70 % PADS Alcohol Prep Pads    [provider]  ARIPiprazole (ABILIFY) 2 MG tablet Take 1 tablet (2 mg total) by mouth daily. 04/22/15   Benjaman Pott, MD  azelastine (ASTELIN) 0.1 % nasal spray Place 2 sprays into both nostrils 2 (two) times daily. 08/07/19   Belinda Fisher, PA-C  Betamethasone Valerate 0.12 % foam Apply 1 application topically daily as needed (itching). Itching  04/22/15   [provider]  buPROPion (WELLBUTRIN XL) 150 MG 24 hr tablet Take 450 mg by mouth every morning. 05/23/19   [provider]  buPROPion (WELLBUTRIN XL) 300 MG 24 hr tablet Take 1 tablet (300 mg total) by mouth daily. 12/28/14   Adonis Brook, NP  cetirizine (ZYRTEC) 10 MG tablet Take 10 mg by mouth daily as needed for allergies.  12/14/17   [provider]  cetirizine-pseudoephedrine (ZYRTEC-D ALLERGY & CONGESTION) 5-120 MG tablet Zyrtec-D 5 mg-120 mg tablet,extended release  TAKE 1 TABLET BY MOUTH EVERY 12 HOURS FOR 10 DAYS    [provider]  clindamycin-benzoyl peroxide (BENZACLIN) gel Apply 1 application topically daily as needed (acne).  02/13/15   [provider]  clobetasol cream (TEMOVATE) 0.05 % Apply 1 application topically daily as needed (skin discoloration).  04/22/15   [provider]  colchicine 0.6 MG tablet Take one tablet by mouth twice daily for 2 weeks 04/25/19   Manson Passey, PA  cyclobenzaprine (FLEXERIL) 10 MG tablet Take 1 tablet by mouth daily at bedtime as needed for muscle spasms. 07/31/19   Gearldine Bienenstock, PA-C  diclofenac Sodium (VOLTAREN) 1 % GEL Apply 2 grams to 4 grams to the affected area up to 4 times daily as needed.  07/31/19   Gearldine Bienenstock, PA-C  diltiazem (CARDIZEM CD) 180 MG 24 hr capsule Take 1 capsule (180 mg total) by mouth daily. 04/16/19 07/31/19  Bhagat, Sharrell Ku, PA  doxepin (SINEQUAN) 25 MG capsule Take 25 mg by mouth at bedtime as needed. 04/28/19   [provider]  EPINEPHrine 0.3 mg/0.3 mL IJ SOAJ injection epinephrine 0.3 mg/0.3 mL injection, auto-injector 07/02/19   [provider]  esomeprazole (NEXIUM) 20 MG capsule Take one tablet by mouth daily for 2 weeks. 04/25/19   Bhagat, Bhavinkumar, PA  FABB 2.2-25-1 MG TABS TAKE 2 TABLETS BY MOUTH EVERY DAY 07/17/19   Pollyann Savoy, MD  famciclovir (FAMVIR) 500 MG tablet Take 15,000 mg by mouth daily as needed (fever blister).  01/21/15   [provider]  fluticasone (FLONASE) 50 MCG/ACT nasal spray Place 2 sprays into both nostrils daily. 10/09/19   Moshe Cipro, NP  folic acid (FOLVITE) 1 MG tablet TAKE 2 TABLETS BY MOUTH EVERY DAY 05/28/19  Pollyann Savoy, MD  gabapentin (NEURONTIN) 100 MG capsule Take 300 mg by mouth daily.    [provider]  gabapentin (NEURONTIN) 300 MG capsule gabapentin 300 mg capsule  TAKE 1 CAPSULE BY MOUTH THREE TIMES A DAY AS NEEDED FOR PAIN    [provider]  hydrOXYzine (VISTARIL) 25 MG capsule Take 25 mg by mouth every 8 (eight) hours as needed for anxiety.  12/30/14   [provider]  ibuprofen (ADVIL) 800 MG tablet ibuprofen 800 mg tablet  TAKE 1 TABLET BY MOUTH THREE TIMES A DAY AS NEEDED    [provider]  levothyroxine (SYNTHROID) 25 MCG tablet levothyroxine 25 mcg tablet  TAKE 1 TABLET IN THE MORNING ON AN EMPTY STOMACH 30 MINUTES BEFORE FOOD    [provider]  lidocaine (LIDODERM) 5 % PLACE 1 PATCH ONTO THE SKIN DAILY. REMOVE & DISCARD PATCH WITHIN 12 HOURS OR AS DIRECTED BY MD 08/17/19   Pollyann Savoy, MD  LORazepam (ATIVAN) 1 MG tablet Take 1 tablet (1 mg total) by mouth 3 (three) times daily as needed for anxiety. 10/09/19    Moshe Cipro, NP  methocarbamol (ROBAXIN) 500 MG tablet methocarbamol 500 mg tablet  TAKE 1 TABLET BY MOUTH TWICE A DAY AS NEEDED FOR SPASMS (DAYTIME)    [provider]  mometasone (NASONEX) 50 MCG/ACT nasal spray 2 sprays by Each Nare route nightly. 07/02/19 07/02/20  [provider]  nitroGLYCERIN (NITROSTAT) 0.4 MG SL tablet Place 1 tablet (0.4 mg total) under the tongue every 5 (five) minutes as needed for chest pain. 05/01/19   Rosalio Macadamia, NP  omeprazole (PRILOSEC) 20 MG capsule omeprazole 20 mg capsule,delayed release    [provider]  Oxymetazoline HCl (NASAL SPRAY) 0.05 % SOLN Nasal Decongestant (oxymetazoline) 0.05 % spray    [provider]  pantoprazole (PROTONIX) 20 MG tablet Take by mouth. 07/02/19   [provider]  predniSONE (DELTASONE) 50 MG tablet Take 1 tablet (50 mg total) by mouth daily with breakfast. 10/11/19   Cathie Hoops, [place]is Spizzirri V, PA-C  promethazine-dextromethorphan (PROMETHAZINE-DM) 6.25-15 MG/5ML syrup promethazine-DM 6.25 mg-15 mg/5 mL oral syrup    [provider]  RASUVO 25 MG/0.5ML SOAJ INJECT ONE PEN SUBCUTANEOUSLY ONCE EVERY WEEK. STORE AT ROOM TEMPERATURE BETWEEN 68 - 77 DEGREES F. Patient taking differently: Inject 25 mg into the skin every 7 (seven) days.  11/03/18   Pollyann Savoy, MD  rosuvastatin (CRESTOR) 20 MG tablet Take 20 mg by mouth. 09/21/16 04/25/19  [provider]  tretinoin (RETIN-A) 0.025 % cream Apply 1 application topically daily as needed (acne).  02/11/15   [provider]  triamcinolone cream (KENALOG) 0.1 % Apply 1 application topically daily as needed (rash). Itching  04/13/15   [provider]  Vitamin D, Ergocalciferol, (DRISDOL) 1.25 MG (50000 UT) CAPS capsule Take 50,000 Units by mouth See admin instructions. Take 1 tablet (50000 units) by mouth every Monday, Wednesday, Saturday    [provider]  XERESE 5-1 % CREA Apply 1 application topically daily  as needed (fever blisters).  12/30/14   [provider]    Family History Family History  Problem Relation Age of Onset  . Hyperlipidemia Mother   . Depression Mother   . Sarcoidosis Father   . Lung disease Father        Pleural Mesothelioma  . Cancer Father   . Heart attack Paternal Grandmother   . Hypertension Paternal Grandmother   . Hypertension Maternal Grandmother   .  Diabetes Maternal Grandmother   . Stroke Neg Hx   . Colon cancer Neg Hx   . Esophageal cancer Neg Hx   . Rectal cancer Neg Hx   . Stomach cancer Neg Hx     Social History Social History   Tobacco Use  . Smoking status: Never Smoker  . Smokeless tobacco: Never Used  Substance Use Topics  . Alcohol use: No  . Drug use: No     Allergies   Influenza vaccines and Isosorbide   Review of Systems Review of Systems  Reason unable to perform ROS: See HPI as above.     Physical Exam Triage Vital Signs ED Triage Vitals [10/11/19 1938]  Enc Vitals Group     BP (!) 150/111     Pulse Rate 81     Resp 16     Temp      Temp src      SpO2 97 %     Weight      Height      Head Circumference      Peak Flow      Pain Score      Pain Loc      Pain Edu?      Excl. in GC?    No data found.  Updated Vital Signs BP (!) 150/111   Pulse 81   Resp 16   SpO2 97%   Visual Acuity Right Eye Distance:   Left Eye Distance:   Bilateral Distance:    Right Eye Near:   Left Eye Near:    Bilateral Near:     Physical Exam Constitutional:      General: She is not in acute distress.    Appearance: Normal appearance. She is well-developed. She is not toxic-appearing or diaphoretic.  HENT:     Head: Normocephalic and atraumatic.     Right Ear: Tympanic membrane, ear canal and external ear normal.     Left Ear: Tympanic membrane, ear canal and external ear normal.     Nose:     Right Sinus: Frontal sinus tenderness present. No maxillary sinus tenderness.     Left Sinus: Frontal sinus tenderness  present. No maxillary sinus tenderness.  Eyes:     Conjunctiva/sclera: Conjunctivae normal.     Pupils: Pupils are equal, round, and reactive to light.  Cardiovascular:     Rate and Rhythm: Normal rate and regular rhythm.  Pulmonary:     Effort: Pulmonary effort is normal. No respiratory distress.     Comments: LCTAB Musculoskeletal:     Cervical back: Normal range of motion and neck supple.  Skin:    General: Skin is warm and dry.  Neurological:     Mental Status: She is alert and oriented to person, place, and time.      UC Treatments / Results  Labs (all labs ordered are listed, but only abnormal results are displayed) Labs Reviewed - No data to display  EKG   Radiology No results found.  Procedures Procedures (including critical care time)  Medications Ordered in UC Medications - No data to display  Initial Impression / Assessment and Plan / UC Course  I have reviewed the triage vital signs and the nursing notes.  Pertinent labs & imaging results that were available during my care of the patient were reviewed by me and considered in my medical decision making (see chart for details).    Will provide prednisone for sinus pressure/headache. Other symptomatic treatment discussed. Return precautions  given.  Final Clinical Impressions(s) / UC Diagnoses   Final diagnoses:  Sinus headache    ED Prescriptions    Medication Sig Dispense Auth. Provider   predniSONE (DELTASONE) 50 MG tablet Take 1 tablet (50 mg total) by mouth daily with breakfast. 5 tablet Belinda Fisher, PA-C     PDMP not reviewed this encounter.   Belinda Fisher, PA-C 10/11/19 2321

## 2019-10-12 NOTE — Telephone Encounter (Signed)
Left message for patient to call back  

## 2019-10-18 ENCOUNTER — Encounter (HOSPITAL_COMMUNITY): Payer: BC Managed Care – PPO

## 2019-10-24 NOTE — Progress Notes (Deleted)
CARDIOLOGY OFFICE NOTE  Date:  10/24/2019    [name] [name] Date of Birth: 1965-10-05 Medical Record #[id number]  PCP:  [name], MD  Cardiologist:  Reita Chard  No chief complaint on file.   History of Present Illness: [name] [name] is a 54 y.o. female who presents today for a work in visit. Seen for Dr. Mayford Knife.   She has a history of mild non obstructive CADwith recurrent non specific chest pain, HLD, chronic diastolic CHF, RA and depression.   She has had past extensive non invasive cardiac work up - continued to have chest pain - led to subsequent cath in 2018 - showing non obstructive CAD with 10% mid LAD lesion - no AS and normal EF.   Seen inER 04/15/2019 for chest pain. Troponin was negative x 3. EKG without acute abnormality. She was discharged from ER. Felt ? Pericarditis vs MSK. Started on short course of prednisone. She was seen by Vin for a telehealth visit - continued chest pain since out of Cardizem - worse with lying down or bending. This discomfort noted to be different from her typical fibromyalgia/rheumatoid arthritis pain. Thus asked to come in for in office visit and EKG - I then saw her in late November - continued with constant chest pain. Lots of stress in her life - several deaths and a son using drugs that she had committed. Stress was just incredible.   The patient {does/does not:200015} have symptoms concerning for COVID-19 infection (fever, chills, cough, or new shortness of breath).   Comes in today. Here with   Past Medical History:  Diagnosis Date  . Anxiety   . Bursitis   . Coronary artery calcification seen on CAT scan    minimal CAD with 30% prox and mild LAD  . Depression   . Diastolic dysfunction   . Dyslipidemia 01/30/2015  . Esophageal ring   . Fibromyalgia   . Hiatal hernia   . HSV-1 infection   . Morbid obesity ([facility])   . Osteoarthritis   . Rheumatoid arthritis(714.0)    M05.79  . Rheumatoid  arthritis, seropositive, 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[facility])    Treated with Orencia, TB neg 09/12/2015  . Spondylolysis     Past Surgical History:  Procedure Laterality Date  . CARDIAC CATHETERIZATION N/A 06/11/2016   Procedure: Left Heart Cath and Coronary Angiography;  Surgeon: Corky Crafts, MD;  Location: [facility]ASIVE [facility];  Service: Cardiovascular;  Laterality: N/A;  . CESAREAN SECTION    . COLONOSCOPY  07/27/2017  . ESOPHAGEAL MANOMETRY N/A 10/28/2014   Procedure: ESOPHAGEAL MANOMETRY (EM);  Surgeon: Hilarie Fredrickson, MD;  Location: [facility];  Service: Endoscopy;  Laterality: N/A;  . HERNIA REPAIR     reports surgery on 3 hernias, with 2 more present  . LAPAROSCOPIC GASTRIC SLEEVE RESECTION  11/21/12   [facility]  . LEFT HEART CATHETERIZATION WITH CORONARY ANGIOGRAM N/A 08/26/2014   Procedure: LEFT HEART CATHETERIZATION WITH CORONARY ANGIOGRAM;  Surgeon: Runell Gess, MD;  Location: [facility] CATH LAB;  Service: Cardiovascular;  Laterality: N/A;  . TUBAL LIGATION       Medications: No outpatient medications have been marked as taking for the 10/31/19 encounter (Appointment) with Rosalio Macadamia, NP.     Allergies: Allergies  Allergen Reactions  . Influenza Vaccines Anaphylaxis  . Isosorbide     Severe headaches    Social History: The patient  reports that she has never smoked. She has never used  smokeless tobacco. She reports that she does not drink alcohol or use drugs.   Family History: The patient's ***family history includes Cancer in her father; Depression in her mother; Diabetes in her maternal grandmother; Heart attack in her paternal grandmother; Hyperlipidemia in her mother; Hypertension in her maternal grandmother and paternal grandmother; Lung disease in her father; Sarcoidosis in her father.   Review of Systems: Please see the history of present illness.   All other systems are reviewed and negative.   Physical Exam: VS:  There were no vitals taken for this visit. Marland Kitchen   BMI There is no height or weight on file to calculate BMI.  Wt Readings from Last 3 Encounters:  09/12/19 260 lb (117.9 kg)  08/20/19 264 lb (119.7 kg)  07/31/19 267 lb (121.1 kg)    General: Pleasant. Well developed, well nourished and in no acute distress.   HEENT: Normal.  Neck: Supple, no JVD, carotid bruits, or masses noted.  Cardiac: ***Regular rate and rhythm. No murmurs, rubs, or gallops. No edema.  Respiratory:  Lungs are clear to auscultation bilaterally with normal work of breathing.  GI: Soft and nontender.  MS: No deformity or atrophy. Gait and ROM intact.  Skin: Warm and dry. Color is normal.  Neuro:  Strength and sensation are intact and no gross focal deficits noted.  Psych: Alert, appropriate and with normal affect.   LABORATORY DATA:  EKG:  EKG {ACTION; IS/IS [id number]} ordered today.  Personally reviewed by me. This demonstrates ***.  Lab Results  Component Value Date   WBC 7.6 08/20/2019   HGB 14.1 08/20/2019   HCT 46.7 (H) 08/20/2019   PLT 260 08/20/2019   GLUCOSE 91 08/20/2019   CHOL 152 02/06/2015   TRIG 82.0 02/06/2015   HDL 55.50 02/06/2015   LDLCALC 80 02/06/2015   ALT 16 08/20/2019   AST 19 08/20/2019   NA 142 08/20/2019   K 3.6 08/20/2019   CL 109 08/20/2019   CREATININE 0.92 08/20/2019   BUN 12 08/20/2019   CO2 28 08/20/2019   INR 1.0 06/04/2016     BNP (last 3 results) No results for input(s): BNP in the last 8760 hours.  ProBNP (last 3 results) No results for input(s): PROBNP in the last 8760 hours.   Other Studies Reviewed Today:  Left Heart Cath and Coronary Angiography 06/2016  Conclusion    The left ventricular systolic function is normal.  LV end diastolic pressure is normal.  The left ventricular ejection fraction is 55-65% by visual estimate.  There is no aortic valve stenosis.  Mid LAD lesion, 10 %stenosed.  Separate ostia of the LAD and circumflex.  Continue preventive medical therapy.   Telemetry  Study Highlights 2017   Normal sinus rhythm and sinus tachycardia with average heart rate 92bpm. The heart rate ranged from 70-120bpm.  Rare PAC    Echo Study Conclusions 2014  - Left ventricle: The cavity size was normal. Wall thickness  was normal. Systolic function was normal. The estimated  ejection fraction was in the range of 50% to 55%. Doppler  parameters are consistent with abnormal left ventricular  relaxation (grade 1 diastolic dysfunction). Doppler  parameters are consistent with high ventricular filling  pressure.  - Left atrium: The atrium was mildly dilated.   ASSESSMENT & PLAN:   1. Chest pain - atypical and chronic - she has known non obstructive CAD with 10% LAD lesion per cath from 2018 - I really think this is all anxiety related -  we will get an echo to make sure that structurally her heart is ok. Further disposition to follow.   2. Fibromyalgia/RA  3. Obesity  4. HTN - BP is fine.   5. Significant situational stress - I think this is the crux of her issues. Discussed at length.    Marland Kitchen COVID-19 Education: The signs and symptoms of COVID-19 were discussed with the patient and how to seek care for testing (follow up with PCP or arrange E-visit).  The importance of social distancing, staying at home, hand hygiene and wearing a mask when out in public were discussed today.  Current medicines are reviewed with the patient today.  The patient does not have concerns regarding medicines other than what has been noted above.  The following changes have been made:  See above.  Labs/ tests ordered today include:   No orders of the defined types were placed in this encounter.    Disposition:   FU with *** in {gen number [id number]} {Days to years:10300}.   Patient is agreeable to this plan and will call if any problems develop in the interim.   SignedTruitt Merle, NP  10/24/2019 7:46 AM  [place] [address] [place], [place]  [address] Phone: [phone] Fax: [phone]

## 2019-10-30 ENCOUNTER — Other Ambulatory Visit: Payer: Self-pay

## 2019-10-30 ENCOUNTER — Ambulatory Visit (HOSPITAL_COMMUNITY)
Admission: RE | Admit: 2019-10-30 | Discharge: 2019-10-30 | Disposition: A | Discharge status: Home / Self Care | Payer: BC Managed Care – PPO | Source: Ambulatory Visit | Attending: Rheumatology | Admitting: Rheumatology

## 2019-10-30 ENCOUNTER — Telehealth: Payer: Self-pay | Admitting: *Deleted

## 2019-10-30 DIAGNOSIS — M0579 Rheumatoid arthritis with rheumatoid factor of multiple sites without organ or systems involvement: Secondary | ICD-10-CM

## 2019-10-30 LAB — COMPREHENSIVE METABOLIC PANEL
ALT: 17 U/L (ref 0–44)
AST: 21 U/L (ref 15–41)
Albumin: 3.4 g/dL — ABNORMAL LOW (ref 3.5–5.0)
Alkaline Phosphatase: 70 U/L (ref 38–126)
Anion gap: 7 (ref 5–15)
BUN: 11 mg/dL (ref 6–20)
CO2: 26 mmol/L (ref 22–32)
Calcium: 9.4 mg/dL (ref 8.9–10.3)
Chloride: 109 mmol/L (ref 98–111)
Creatinine, Ser: 0.88 mg/dL (ref 0.44–1.00)
GFR calc Af Amer: >60 mL/min (ref >60)
GFR calc non Af Amer: >60 mL/min (ref >60)
Glucose, Bld: 98 mg/dL (ref 70–99)
Potassium: 3.8 mmol/L (ref 3.5–5.1)
Sodium: 142 mmol/L (ref 135–145)
Total Bilirubin: 0.3 mg/dL (ref 0.3–1.2)
Total Protein: 6.8 g/dL (ref 6.5–8.1)

## 2019-10-30 LAB — CBC
HCT: 46.1 % — ABNORMAL HIGH (ref 36.0–46.0)
Hemoglobin: 14.2 g/dL (ref 12.0–15.0)
MCH: 26.6 pg (ref 26.0–34.0)
MCHC: 30.8 g/dL (ref 30.0–36.0)
MCV: 86.3 fL (ref 80.0–100.0)
Platelets: 281 10*3/uL (ref 150–400)
RBC: 5.34 MIL/uL — ABNORMAL HIGH (ref 3.87–5.11)
RDW: 12.5 % (ref 11.5–15.5)
WBC: 8.5 10*3/uL (ref 4.0–10.5)
nRBC: 0 % (ref 0.0–0.2)

## 2019-10-30 MED ORDER — ACETAMINOPHEN 325 MG PO TABS
650.0000 mg | ORAL_TABLET | ORAL | Status: DC
  Administered 2019-10-30: 650 mg via ORAL

## 2019-10-30 MED ORDER — DIPHENHYDRAMINE HCL 25 MG PO CAPS
ORAL_CAPSULE | ORAL | Status: AC
  Filled 2019-10-30: qty 1

## 2019-10-30 MED ORDER — ACETAMINOPHEN 325 MG PO TABS
ORAL_TABLET | ORAL | Status: AC
  Filled 2019-10-30: qty 2

## 2019-10-30 MED ORDER — DIPHENHYDRAMINE HCL 25 MG PO CAPS
25.0000 mg | ORAL_CAPSULE | ORAL | Status: DC
  Administered 2019-10-30: 25 mg via ORAL

## 2019-10-30 MED ORDER — SODIUM CHLORIDE 0.9 % IV SOLN
1000.0000 mg | INTRAVENOUS | Status: DC
  Administered 2019-10-30: 1000 mg via INTRAVENOUS
  Filled 2019-10-30: qty 40

## 2019-10-30 NOTE — Telephone Encounter (Signed)
No albumin is due to chronic disease.  Dietary modifications will not change it.

## 2019-10-30 NOTE — Telephone Encounter (Signed)
Patient advised albumin is due to chronic disease.  Dietary modifications will not change it.

## 2019-10-30 NOTE — Telephone Encounter (Signed)
Contacted patient regarding her lab results. Patient would like to know what may be causing her Albumin to be low and if there is anything she may need to do. Please advise.

## 2019-10-30 NOTE — Telephone Encounter (Signed)
-----   Message from Gearldine Bienenstock, PA-C sent at 10/30/2019 12:22 PM EDT ----- Albumin is borderline low but stable. Rest of CMP WNL.

## 2019-10-31 ENCOUNTER — Ambulatory Visit: Payer: BC Managed Care – PPO | Admitting: Nurse Practitioner

## 2019-11-27 ENCOUNTER — Encounter (HOSPITAL_COMMUNITY): Payer: BC Managed Care – PPO

## 2019-12-12 ENCOUNTER — Other Ambulatory Visit: Payer: Self-pay | Admitting: Rheumatology

## 2019-12-13 NOTE — Telephone Encounter (Signed)
Last Visit: 07/31/19 Next Visit due July 2021. Message sent to the front to schedule.   Okay to refill per Dr. Corliss Skains

## 2019-12-13 NOTE — Telephone Encounter (Signed)
Please schedule patient for a follow up visit. Patient due July 2021. Thanks!  

## 2019-12-21 ENCOUNTER — Ambulatory Visit (INDEPENDENT_AMBULATORY_CARE_PROVIDER_SITE_OTHER): Payer: BC Managed Care – PPO | Admitting: Podiatry

## 2019-12-21 ENCOUNTER — Encounter: Payer: Self-pay | Admitting: Podiatry

## 2019-12-21 ENCOUNTER — Other Ambulatory Visit: Payer: Self-pay

## 2019-12-21 ENCOUNTER — Ambulatory Visit (INDEPENDENT_AMBULATORY_CARE_PROVIDER_SITE_OTHER): Payer: BC Managed Care – PPO

## 2019-12-21 DIAGNOSIS — M722 Plantar fascial fibromatosis: Secondary | ICD-10-CM | POA: Diagnosis not present

## 2019-12-21 NOTE — Progress Notes (Signed)
Subjective:  Patient ID: [name] [name], female    DOB: 1966/03/22,  MRN: [id number] HPI Chief Complaint  Patient presents with  . Foot Pain    Plantar forefoot and heel left - aching x few weeks, AM pain, takes meds regularly for RA, missed some appointments wonders if caused increase pain  . New Patient (Initial Visit)    54 y.o. female presents with the above complaint.   ROS: Denies fever chills nausea vomiting muscle aches pains calf pain back pain chest pain shortness of breath.  Past Medical History:  Diagnosis Date  . Anxiety   . Bursitis   . Coronary artery calcification seen on CAT scan    minimal CAD with 30% prox and mild LAD  . Depression   . Diastolic dysfunction   . Dyslipidemia 01/30/2015  . Esophageal ring   . Fibromyalgia   . Hiatal hernia   . HSV-1 infection   . Morbid obesity (HCC)   . Osteoarthritis   . Rheumatoid arthritis(714.0)    M05.79  . Rheumatoid arthritis, seropositive, 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HCC)    Treated with Orencia, TB neg 09/12/2015  . Spondylolysis    Past Surgical History:  Procedure Laterality Date  . CARDIAC CATHETERIZATION N/A 06/11/2016   Procedure: Left Heart Cath and Coronary Angiography;  Surgeon: Corky Crafts, MD;  Location: [facility] INVASIVE CV LAB;  Service: Cardiovascular;  Laterality: N/A;  . CESAREAN SECTION    . COLONOSCOPY  07/27/2017  . ESOPHAGEAL MANOMETRY N/A 10/28/2014   Procedure: ESOPHAGEAL MANOMETRY (EM);  Surgeon: Hilarie Fredrickson, MD;  Location: [facility] ENDOSCOPY;  Service: Endoscopy;  Laterality: N/A;  . HERNIA REPAIR     reports surgery on 3 hernias, with 2 more present  . LAPAROSCOPIC GASTRIC SLEEVE RESECTION  11/21/12   [facility]  . LEFT HEART CATHETERIZATION WITH CORONARY ANGIOGRAM N/A 08/26/2014   Procedure: LEFT HEART CATHETERIZATION WITH CORONARY ANGIOGRAM;  Surgeon: Runell Gess, MD;  Location: [facility] CATH LAB;  Service: Cardiovascular;  Laterality: N/A;  . TUBAL LIGATION      Current Outpatient  Medications:  .  Abatacept (ORENCIA IV), Inject 1,000 mg into the vein every 28 (twenty-eight) days. , Disp: , Rfl:  .  Acetaminophen-Codeine 300-30 MG tablet, acetaminophen 300 mg-codeine 30 mg tablet, Disp: , Rfl:  .  Adapalene 0.3 % gel, Apply 1 application topically daily as needed (acne). , Disp: , Rfl:  .  Alcohol Swabs (ALCOHOL WIPES) 70 % PADS, Alcohol Prep Pads, Disp: , Rfl:  .  ARIPiprazole (ABILIFY) 2 MG tablet, Take 1 tablet (2 mg total) by mouth daily., Disp: 30 tablet, Rfl: 0 .  atorvastatin (LIPITOR) 80 MG tablet, Add'l Sig Add'l Sig oral Add'l Sig, Disp: , Rfl:  .  azelastine (ASTELIN) 0.1 % nasal spray, Place 2 sprays into both nostrils 2 (two) times daily., Disp: 30 mL, Rfl: 0 .  Betamethasone Valerate 0.12 % foam, Apply 1 application topically daily as needed (itching). Itching , Disp: , Rfl: 7 .  buPROPion (WELLBUTRIN XL) 150 MG 24 hr tablet, Take 450 mg by mouth every morning., Disp: , Rfl:  .  buPROPion (WELLBUTRIN XL) 300 MG 24 hr tablet, Take 1 tablet (300 mg total) by mouth daily., Disp: 30 tablet, Rfl: 0 .  cetirizine (ZYRTEC) 10 MG tablet, Take 10 mg by mouth daily as needed for allergies. , Disp: , Rfl:  .  cetirizine-pseudoephedrine (ZYRTEC-D ALLERGY & CONGESTION) 5-120 MG tablet, Zyrtec-D 5 mg-120 mg tablet,extended release  TAKE 1 TABLET BY  MOUTH EVERY 12 HOURS FOR 10 DAYS, Disp: , Rfl:  .  clindamycin-benzoyl peroxide (BENZACLIN) gel, Apply 1 application topically daily as needed (acne). , Disp: , Rfl:  .  clobetasol cream (TEMOVATE) 0.05 %, Apply 1 application topically daily as needed (skin discoloration). , Disp: , Rfl: 0 .  colchicine 0.6 MG tablet, Take one tablet by mouth twice daily for 2 weeks, Disp: 28 tablet, Rfl: 0 .  cyclobenzaprine (FLEXERIL) 10 MG tablet, Take 1 tablet by mouth daily at bedtime as needed for muscle spasms., Disp: 90 tablet, Rfl: 0 .  diclofenac Sodium (VOLTAREN) 1 % GEL, Apply 2 grams to 4 grams to the affected area up to 4 times daily  as needed., Disp: 400 g, Rfl: 4 .  diltiazem (CARDIZEM CD) 180 MG 24 hr capsule, Take 1 capsule (180 mg total) by mouth daily., Disp: 90 capsule, Rfl: 3 .  diltiazem (TIAZAC) 180 MG 24 hr capsule, Take by mouth., Disp: , Rfl:  .  doxepin (SINEQUAN) 25 MG capsule, Take 25 mg by mouth at bedtime as needed., Disp: , Rfl:  .  EPINEPHrine 0.3 mg/0.3 mL IJ SOAJ injection, epinephrine 0.3 mg/0.3 mL injection, auto-injector, Disp: , Rfl:  .  esomeprazole (NEXIUM) 20 MG capsule, Take one tablet by mouth daily for 2 weeks., Disp: 14 capsule, Rfl: 0 .  ezetimibe (ZETIA) 10 MG tablet, Take 10 mg by mouth daily., Disp: , Rfl:  .  FABB 2.2-25-1 MG TABS, TAKE 2 TABLETS BY MOUTH EVERY DAY, Disp: 180 tablet, Rfl: 0 .  famciclovir (FAMVIR) 500 MG tablet, Take 15,000 mg by mouth daily as needed (fever blister). , Disp: , Rfl: 11 .  fluticasone (FLONASE) 50 MCG/ACT nasal spray, Place 2 sprays into both nostrils daily., Disp: 11.1 mL, Rfl: 2 .  folic acid (FOLVITE) 1 MG tablet, TAKE 2 TABLETS BY MOUTH EVERY DAY, Disp: 180 tablet, Rfl: 3 .  gabapentin (NEURONTIN) 100 MG capsule, Take 300 mg by mouth daily., Disp: , Rfl:  .  gabapentin (NEURONTIN) 300 MG capsule, gabapentin 300 mg capsule  TAKE 1 CAPSULE BY MOUTH THREE TIMES A DAY AS NEEDED FOR PAIN, Disp: , Rfl:  .  hydrocortisone 2.5 % cream, SMARTSIG:1 Topical Every Night, Disp: , Rfl:  .  hydrOXYzine (VISTARIL) 25 MG capsule, Take 25 mg by mouth every 8 (eight) hours as needed for anxiety. , Disp: , Rfl:  .  ibuprofen (ADVIL) 800 MG tablet, ibuprofen 800 mg tablet  TAKE 1 TABLET BY MOUTH THREE TIMES A DAY AS NEEDED, Disp: , Rfl:  .  levothyroxine (SYNTHROID) 25 MCG tablet, levothyroxine 25 mcg tablet  TAKE 1 TABLET IN THE MORNING ON AN EMPTY STOMACH 30 MINUTES BEFORE FOOD, Disp: , Rfl:  .  lidocaine (LIDODERM) 5 %, PLACE 1 PATCH ONTO THE SKIN DAILY. REMOVE & DISCARD PATCH WITHIN 12 HOURS OR AS DIRECTED BY MD, Disp: 30 patch, Rfl: 0 .  LORazepam (ATIVAN) 1 MG tablet,  Take 1 tablet (1 mg total) by mouth 3 (three) times daily as needed for anxiety., Disp: 15 tablet, Rfl: 0 .  meloxicam (MOBIC) 15 MG tablet, Add'l Sig Add'l Sig oral Add'l Sig, Disp: , Rfl:  .  methocarbamol (ROBAXIN) 500 MG tablet, methocarbamol 500 mg tablet  TAKE 1 TABLET BY MOUTH TWICE A DAY AS NEEDED FOR SPASMS (DAYTIME), Disp: , Rfl:  .  mometasone (NASONEX) 50 MCG/ACT nasal spray, 2 sprays by Each Nare route nightly., Disp: , Rfl:  .  nitroGLYCERIN (NITROSTAT) 0.4 MG SL tablet,  Place 1 tablet (0.4 mg total) under the tongue every 5 (five) minutes as needed for chest pain., Disp: 25 tablet, Rfl: 3 .  omeprazole (PRILOSEC) 20 MG capsule, omeprazole 20 mg capsule,delayed release, Disp: , Rfl:  .  Oxymetazoline HCl (NASAL SPRAY) 0.05 % SOLN, Nasal Decongestant (oxymetazoline) 0.05 % spray, Disp: , Rfl:  .  pantoprazole (PROTONIX) 20 MG tablet, Take by mouth., Disp: , Rfl:  .  pantoprazole (PROTONIX) 40 MG tablet, Add'l Sig Add'l Sig oral Add'l Sig, Disp: , Rfl:  .  predniSONE (DELTASONE) 50 MG tablet, Take 1 tablet (50 mg total) by mouth daily with breakfast., Disp: 5 tablet, Rfl: 0 .  promethazine-dextromethorphan (PROMETHAZINE-DM) 6.25-15 MG/5ML syrup, promethazine-DM 6.25 mg-15 mg/5 mL oral syrup, Disp: , Rfl:  .  propranolol (INDERAL) 10 MG tablet, Take 10 mg by mouth daily., Disp: , Rfl:  .  RASUVO 25 MG/0.5ML SOAJ, INJECT ONE PEN SUBCUTANEOUSLY ONCE EVERY WEEK. STORE AT ROOM TEMPERATURE BETWEEN 68 - 77 DEGREES F. (Patient taking differently: Inject 25 mg into the skin every 7 (seven) days. ), Disp: 12 pen, Rfl: 0 .  rosuvastatin (CRESTOR) 20 MG tablet, Take 20 mg by mouth., Disp: , Rfl:  .  tretinoin (RETIN-A) 0.025 % cream, Apply 1 application topically daily as needed (acne). , Disp: , Rfl:  .  triamcinolone cream (KENALOG) 0.1 %, Apply 1 application topically daily as needed (rash). Itching , Disp: , Rfl: 0 .  TROKENDI XR 50 MG CP24, Add'l Sig Add'l Sig oral Add'l Sig, Disp: , Rfl:  .   Vitamin D, Ergocalciferol, (DRISDOL) 1.25 MG (50000 UT) CAPS capsule, Take 50,000 Units by mouth See admin instructions. Take 1 tablet (50000 units) by mouth every Monday, Wednesday, Saturday, Disp: , Rfl:  .  XERESE 5-1 % CREA, Apply 1 application topically daily as needed (fever blisters). , Disp: , Rfl:   Allergies  Allergen Reactions  . Influenza Vaccines Anaphylaxis  . Isosorbide     Severe headaches   Review of Systems Objective:  There were no vitals filed for this visit.  General: Well developed, nourished, in no acute distress, alert and oriented x3   Dermatological: Skin is warm, dry and supple bilateral. Nails x 10 are well maintained; remaining integument appears unremarkable at this time. There are no open sores, no preulcerative lesions, no rash or signs of infection present.  Vascular: Dorsalis Pedis artery and Posterior Tibial artery pedal pulses are 2/4 bilateral with immedate capillary fill time. Pedal hair growth present. No varicosities and no lower extremity edema present bilateral.   Neruologic: Grossly intact via light touch bilateral. Vibratory intact via tuning fork bilateral. Protective threshold with Semmes Wienstein monofilament intact to all pedal sites bilateral. Patellar and Achilles deep tendon reflexes 2+ bilateral. No Babinski or clonus noted bilateral.   Musculoskeletal: No gross boney pedal deformities bilateral. No pain, crepitus, or limitation noted with foot and ankle range of motion bilateral. Muscular strength 5/5 in all groups tested bilateral.  Pain on palpation medial calcaneal tubercles bilateral left greater than right.  She also has some tenderness on range of motion of the second metatarsophalangeal joint left foot.  Mild edema no erythema cellulitis drainage or odor bilaterally.  Gait: Unassisted, Nonantalgic.    Radiographs:  Radiographs taken today demonstrate osseously mature individual no significant arthritic changes in the forefoot  and midfoot.  She does have soft tissue swelling of the plantar fashion calcaneal insertion sites bilateral.  Mild spurs are noted.  Assessment & Plan:  Assessment: Plantar fasciitis bilateral metatarsalgia capsulitis forefoot left  Plan: Discussed etiology pathology conservative versus surgical therapies.  Injected her left heel today 20 mg Kenalog 5 mg of Marcaine put her in plantar fascial braces bilaterally.  We discussed appropriate shoe gear stretching exercise ice therapy sugar modifications.  She only wanted the injection in her left heel.     Celia Gibbons T. [name], [place]

## 2019-12-26 ENCOUNTER — Ambulatory Visit (HOSPITAL_COMMUNITY)
Admission: RE | Admit: 2019-12-26 | Discharge: 2019-12-26 | Disposition: A | Discharge status: Home / Self Care | Payer: BC Managed Care – PPO | Source: Ambulatory Visit | Attending: Rheumatology | Admitting: Rheumatology

## 2019-12-26 ENCOUNTER — Other Ambulatory Visit: Payer: Self-pay

## 2019-12-26 DIAGNOSIS — M0579 Rheumatoid arthritis with rheumatoid factor of multiple sites without organ or systems involvement: Secondary | ICD-10-CM | POA: Insufficient documentation

## 2019-12-26 LAB — CBC
HCT: 43.8 % (ref 36.0–46.0)
Hemoglobin: 13.2 g/dL (ref 12.0–15.0)
MCH: 25.8 pg — ABNORMAL LOW (ref 26.0–34.0)
MCHC: 30.1 g/dL (ref 30.0–36.0)
MCV: 85.5 fL (ref 80.0–100.0)
Platelets: 258 10*3/uL (ref 150–400)
RBC: 5.12 MIL/uL — ABNORMAL HIGH (ref 3.87–5.11)
RDW: 12.4 % (ref 11.5–15.5)
WBC: 8.4 10*3/uL (ref 4.0–10.5)
nRBC: 0 % (ref 0.0–0.2)

## 2019-12-26 LAB — COMPREHENSIVE METABOLIC PANEL
ALT: 18 U/L (ref 0–44)
AST: 19 U/L (ref 15–41)
Albumin: 3.2 g/dL — ABNORMAL LOW (ref 3.5–5.0)
Alkaline Phosphatase: 69 U/L (ref 38–126)
Anion gap: 10 (ref 5–15)
BUN: 11 mg/dL (ref 6–20)
CO2: 26 mmol/L (ref 22–32)
Calcium: 9 mg/dL (ref 8.9–10.3)
Chloride: 105 mmol/L (ref 98–111)
Creatinine, Ser: 0.88 mg/dL (ref 0.44–1.00)
GFR calc Af Amer: >60 mL/min (ref >60)
GFR calc non Af Amer: >60 mL/min (ref >60)
Glucose, Bld: 102 mg/dL — ABNORMAL HIGH (ref 70–99)
Potassium: 3.9 mmol/L (ref 3.5–5.1)
Sodium: 141 mmol/L (ref 135–145)
Total Bilirubin: 0.7 mg/dL (ref 0.3–1.2)
Total Protein: 6.5 g/dL (ref 6.5–8.1)

## 2019-12-26 MED ORDER — ACETAMINOPHEN 325 MG PO TABS
ORAL_TABLET | ORAL | Status: AC
  Administered 2019-12-26: 650 mg via ORAL
  Filled 2019-12-26: qty 2

## 2019-12-26 MED ORDER — DIPHENHYDRAMINE HCL 25 MG PO CAPS: 25.0000 mg | ORAL_CAPSULE | ORAL | Status: DC

## 2019-12-26 MED ORDER — SODIUM CHLORIDE 0.9 % IV SOLN
1000.0000 mg | INTRAVENOUS | Status: DC
  Administered 2019-12-26: 1000 mg via INTRAVENOUS
  Filled 2019-12-26: qty 40

## 2019-12-26 MED ORDER — DIPHENHYDRAMINE HCL 25 MG PO CAPS
ORAL_CAPSULE | ORAL | Status: AC
  Administered 2019-12-26: 25 mg via ORAL
  Filled 2019-12-26: qty 1

## 2019-12-26 MED ORDER — ACETAMINOPHEN 325 MG PO TABS: 650.0000 mg | ORAL_TABLET | ORAL | Status: DC

## 2019-12-27 ENCOUNTER — Telehealth: Payer: Self-pay | Admitting: *Deleted

## 2019-12-27 NOTE — Telephone Encounter (Signed)
Patient advised she can go to [facility].  Patient advised she can call [phone] to schedule her next infusion which is due on 8/18.

## 2019-12-27 NOTE — Telephone Encounter (Signed)
Patient returned call to review labs results. Patient advised of lab results. While on the phone patient asked if there was anywhere else she can get her infusions. Patient states the infusion suite was moved to the first floor. Patient states the area where it was moved to is around the area where her son dies after a tragic car accident. Patient would like to not have to go there.

## 2019-12-27 NOTE — Telephone Encounter (Signed)
Patient can go to [facility].  She can call [phone] to schedule her next infusion which is due on 8/18.

## 2020-01-04 ENCOUNTER — Ambulatory Visit: Payer: BC Managed Care – PPO | Admitting: Rheumatology

## 2020-01-05 ENCOUNTER — Ambulatory Visit
Admission: EM | Admit: 2020-01-05 | Discharge: 2020-01-05 | Disposition: A | Discharge status: Home / Self Care | Payer: BC Managed Care – PPO | Attending: Emergency Medicine | Admitting: Emergency Medicine

## 2020-01-05 ENCOUNTER — Other Ambulatory Visit: Payer: Self-pay

## 2020-01-05 ENCOUNTER — Encounter: Payer: Self-pay | Admitting: Emergency Medicine

## 2020-01-05 DIAGNOSIS — R0981 Nasal congestion: Secondary | ICD-10-CM

## 2020-01-05 DIAGNOSIS — R079 Chest pain, unspecified: Secondary | ICD-10-CM | POA: Diagnosis not present

## 2020-01-05 NOTE — ED Provider Notes (Signed)
[facility]    CSN: [id number] Arrival date & time: 01/05/20  1547      History   Chief Complaint Chief Complaint  Patient presents with  . Chest Pain    HPI [name] [name] is a 54 y.o. female with h/o obesity, RA, fibromyalgia, minimal CAD per CT presenting for left-sided chest pain.  States it began last night while lying in bed, has become more constant since.  Denies shortness of breath, nausea, vomiting, palpitations, lightheadedness, dizziness.  States pain radiates to left jaw and left arm, though is unsure if this is related to anxiety.  No lower leg swelling, recent change in diet, lifestyle, medications.   Past Medical History:  Diagnosis Date  . Anxiety   . Bursitis   . Coronary artery calcification seen on CAT scan    minimal CAD with 30% prox and mild LAD  . Depression   . Diastolic dysfunction   . Dyslipidemia 01/30/2015  . Esophageal ring   . Fibromyalgia   . Hiatal hernia   . HSV-1 infection   . Morbid obesity (HCC)   . Osteoarthritis   . Rheumatoid arthritis(714.0)    M05.79  . Rheumatoid arthritis, seropositive, 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HCC)    Treated with Orencia, TB neg 09/12/2015  . Spondylolysis     Patient Active Problem List   Diagnosis Date Noted  . Anxiety 02/20/2018  . Fatigue 02/20/2018  . Low blood potassium 02/20/2018  . Allergic rhinitis 12/14/2017  . Fever blister 12/14/2017  . Head congestion 12/14/2017  . Hyperlipidemia 12/14/2017  . Lateral epicondylitis of right elbow 05/04/2017  . Sacral pain 03/23/2017  . High risk medication use 08/03/2016  . Fibromyalgia syndrome 08/03/2016  . Abnormal cardiac CT angiography   . Sacrococcygeal pain 02/20/2016  . Rheumatoid arthritis involving 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HCC) 01/24/2016  . 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 Dyslipidemia 01/30/2015  . 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 Essential hypertension 08/25/2014  . Morbid  obesity (HCC) 08/25/2014  . CAD (coronary artery disease), native coronary artery 08/24/2014  . Numbness and tingling in left arm   . Arm numbness left 08/22/2014  . Chest pain 08/22/2014  . Gastroesophageal reflux disease without esophagitis 07/23/2014  . Coronary artery calcification seen on CAT scan 02/19/2014  . Hip pain 10/23/2013  . Solitary pulmonary nodule 08/28/2013  . Chest pain, atypical 07/18/2013  . Heart palpitations 06/27/2013  . Cough 06/04/2013  . Diastolic dysfunction 05/18/2013  . SOB (shortness of breath) 05/09/2013  . Other malaise and fatigue 01/21/2013  . Stress and adjustment reaction 12/09/2012  . Right sided abdominal pain 11/29/2012  . 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/21/2012  . Morbid obesity with BMI of 50.0-59.9, adult (HCC) 11/21/2012  . Arthritis 07/13/2012  . Depression 07/13/2012  . Routine health maintenance 05/07/2012  . Migraine 06/07/1998    Past Surgical History:  Procedure Laterality Date  . CARDIAC CATHETERIZATION N/A 06/11/2016   Procedure: Left Heart Cath and Coronary Angiography;  Surgeon: Corky Crafts, MD;  Location: [facility] INVASIVE CV LAB;  Service: Cardiovascular;  Laterality: N/A;  . CESAREAN SECTION    . COLONOSCOPY  07/27/2017  . ESOPHAGEAL MANOMETRY N/A 10/28/2014   Procedure: ESOPHAGEAL MANOMETRY (EM);  Surgeon: Hilarie Fredrickson, MD;  Location: [facility] ENDOSCOPY;  Service: Endoscopy;  Laterality: N/A;  . HERNIA REPAIR     reports surgery on 3 hernias, with 2 more present  . LAPAROSCOPIC GASTRIC SLEEVE RESECTION  11/21/12  [facility]  . LEFT HEART CATHETERIZATION WITH CORONARY ANGIOGRAM N/A 08/26/2014   Procedure: LEFT HEART CATHETERIZATION WITH CORONARY ANGIOGRAM;  Surgeon: Runell Gess, MD;  Location: [facility] CATH LAB;  Service: Cardiovascular;  Laterality: N/A;  . TUBAL LIGATION      OB History   No obstetric history on file.      Home Medications    Prior to Admission medications   Medication Sig Start Date  End Date Taking? Authorizing Provider  Abatacept (ORENCIA IV) Inject 1,000 mg into the vein every 28 (twenty-eight) days.     [provider]  Acetaminophen-Codeine 300-30 MG tablet acetaminophen 300 mg-codeine 30 mg tablet    [provider]  Adapalene 0.3 % gel Apply 1 application topically daily as needed (acne).  12/31/14   [provider]  Alcohol Swabs (ALCOHOL WIPES) 70 % PADS Alcohol Prep Pads    [provider]  ARIPiprazole (ABILIFY) 2 MG tablet Take 1 tablet (2 mg total) by mouth daily. 04/22/15   Benjaman Pott, MD  atorvastatin (LIPITOR) 80 MG tablet Add'l Sig Add'l Sig oral Add'l Sig 10/10/19   [provider]  azelastine (ASTELIN) 0.1 % nasal spray Place 2 sprays into both nostrils 2 (two) times daily. 08/07/19   Belinda Fisher, PA-C  Betamethasone Valerate 0.12 % foam Apply 1 application topically daily as needed (itching). Itching  04/22/15   [provider]  buPROPion (WELLBUTRIN XL) 150 MG 24 hr tablet Take 450 mg by mouth every morning. 05/23/19   [provider]  buPROPion (WELLBUTRIN XL) 300 MG 24 hr tablet Take 1 tablet (300 mg total) by mouth daily. 12/28/14   Adonis Brook, NP  cetirizine (ZYRTEC) 10 MG tablet Take 10 mg by mouth daily as needed for allergies.  12/14/17   [provider]  cetirizine-pseudoephedrine (ZYRTEC-D ALLERGY & CONGESTION) 5-120 MG tablet Zyrtec-D 5 mg-120 mg tablet,extended release  TAKE 1 TABLET BY MOUTH EVERY 12 HOURS FOR 10 DAYS    [provider]  clindamycin-benzoyl peroxide (BENZACLIN) gel Apply 1 application topically daily as needed (acne).  02/13/15   [provider]  clobetasol cream (TEMOVATE) 0.05 % Apply 1 application topically daily as needed (skin discoloration).  04/22/15   [provider]  colchicine 0.6 MG tablet Take one tablet by mouth twice daily for 2 weeks 04/25/19   Manson Passey, PA  cyclobenzaprine (FLEXERIL) 10 MG tablet Take 1  tablet by mouth daily at bedtime as needed for muscle spasms. 07/31/19   Gearldine Bienenstock, PA-C  diclofenac Sodium (VOLTAREN) 1 % GEL Apply 2 grams to 4 grams to the affected area up to 4 times daily as needed. 07/31/19   Gearldine Bienenstock, PA-C  diltiazem (CARDIZEM CD) 180 MG 24 hr capsule Take 1 capsule (180 mg total) by mouth daily. 04/16/19 07/31/19  Manson Passey, PA  diltiazem (TIAZAC) 180 MG 24 hr capsule Take by mouth. 08/27/13   [provider]  doxepin (SINEQUAN) 25 MG capsule Take 25 mg by mouth at bedtime as needed. 04/28/19   [provider]  EPINEPHrine 0.3 mg/0.3 mL IJ SOAJ injection epinephrine 0.3 mg/0.3 mL injection, auto-injector 07/02/19   [provider]  esomeprazole (NEXIUM) 20 MG capsule Take one tablet by mouth daily for 2 weeks. 04/25/19   Manson Passey, PA  ezetimibe (ZETIA) 10 MG tablet Take 10 mg by mouth daily. 10/22/19   [provider]  FABB 2.2-25-1 MG TABS TAKE 2 TABLETS BY MOUTH EVERY DAY  07/17/19   Pollyann Savoy, MD  famciclovir (FAMVIR) 500 MG tablet Take 15,000 mg by mouth daily as needed (fever blister).  01/21/15   [provider]  fluticasone (FLONASE) 50 MCG/ACT nasal spray Place 2 sprays into both nostrils daily. 10/09/19   Moshe Cipro, NP  folic acid (FOLVITE) 1 MG tablet TAKE 2 TABLETS BY MOUTH EVERY DAY 05/28/19   Pollyann Savoy, MD  gabapentin (NEURONTIN) 100 MG capsule Take 300 mg by mouth daily.    [provider]  gabapentin (NEURONTIN) 300 MG capsule gabapentin 300 mg capsule  TAKE 1 CAPSULE BY MOUTH THREE TIMES A DAY AS NEEDED FOR PAIN    [provider]  hydrocortisone 2.5 % cream SMARTSIG:1 Topical Every Night 11/06/19   [provider]  hydrOXYzine (VISTARIL) 25 MG capsule Take 25 mg by mouth every 8 (eight) hours as needed for anxiety.  12/30/14   [provider]  ibuprofen (ADVIL) 800 MG tablet ibuprofen 800 mg tablet  TAKE 1 TABLET BY MOUTH THREE TIMES A  DAY AS NEEDED    [provider]  levothyroxine (SYNTHROID) 25 MCG tablet levothyroxine 25 mcg tablet  TAKE 1 TABLET IN THE MORNING ON AN EMPTY STOMACH 30 MINUTES BEFORE FOOD    [provider]  lidocaine (LIDODERM) 5 % PLACE 1 PATCH ONTO THE SKIN DAILY. REMOVE & DISCARD PATCH WITHIN 12 HOURS OR AS DIRECTED BY MD 12/13/19   Pollyann Savoy, MD  LORazepam (ATIVAN) 1 MG tablet Take 1 tablet (1 mg total) by mouth 3 (three) times daily as needed for anxiety. 10/09/19   Moshe Cipro, NP  meloxicam (MOBIC) 15 MG tablet Add'l Sig Add'l Sig oral Add'l Sig 10/10/19   [provider]  methocarbamol (ROBAXIN) 500 MG tablet methocarbamol 500 mg tablet  TAKE 1 TABLET BY MOUTH TWICE A DAY AS NEEDED FOR SPASMS (DAYTIME)    [provider]  mometasone (NASONEX) 50 MCG/ACT nasal spray 2 sprays by Each Nare route nightly. 07/02/19 07/02/20  [provider]  nitroGLYCERIN (NITROSTAT) 0.4 MG SL tablet Place 1 tablet (0.4 mg total) under the tongue every 5 (five) minutes as needed for chest pain. 05/01/19   Rosalio Macadamia, NP  omeprazole (PRILOSEC) 20 MG capsule omeprazole 20 mg capsule,delayed release    [provider]  Oxymetazoline HCl (NASAL SPRAY) 0.05 % SOLN Nasal Decongestant (oxymetazoline) 0.05 % spray    [provider]  pantoprazole (PROTONIX) 20 MG tablet Take by mouth. 07/02/19   [provider]  pantoprazole (PROTONIX) 40 MG tablet Add'l Sig Add'l Sig oral Add'l Sig 10/10/19   [provider]  predniSONE (DELTASONE) 50 MG tablet Take 1 tablet (50 mg total) by mouth daily with breakfast. 10/11/19   Cathie Hoops, Amy V, PA-C  promethazine-dextromethorphan (PROMETHAZINE-DM) 6.25-15 MG/5ML syrup promethazine-DM 6.25 mg-15 mg/5 mL oral syrup    [provider]  propranolol (INDERAL) 10 MG tablet Take 10 mg by mouth daily.    [provider]  RASUVO 25 MG/0.5ML SOAJ INJECT ONE PEN SUBCUTANEOUSLY ONCE EVERY WEEK. STORE AT ROOM  TEMPERATURE BETWEEN 68 - 77 DEGREES F. Patient taking differently: Inject 25 mg into the skin every 7 (seven) days.  11/03/18   Pollyann Savoy, MD  rosuvastatin (CRESTOR) 20 MG tablet Take 20 mg by mouth. 09/21/16 04/25/19  [provider]  tretinoin (RETIN-A) 0.025 % cream Apply 1 application topically daily as needed (acne).  02/11/15   [provider]  triamcinolone cream (KENALOG) 0.1 % Apply 1 application topically  daily as needed (rash). Itching  04/13/15   [provider]  TROKENDI XR 50 MG CP24 Add'l Sig Add'l Sig oral Add'l Sig 10/10/19   [provider]  Vitamin D, Ergocalciferol, (DRISDOL) 1.25 MG (50000 UT) CAPS capsule Take 50,000 Units by mouth See admin instructions. Take 1 tablet (50000 units) by mouth every Monday, Wednesday, Saturday    [provider]  XERESE 5-1 % CREA Apply 1 application topically daily as needed (fever blisters).  12/30/14   [provider]    Family History Family History  Problem Relation Age of Onset  . Hyperlipidemia Mother   . Depression Mother   . Sarcoidosis Father   . Lung disease Father        Pleural Mesothelioma  . Cancer Father   . Heart attack Paternal Grandmother   . Hypertension Paternal Grandmother   . Hypertension Maternal Grandmother   . Diabetes Maternal Grandmother   . Stroke Neg Hx   . Colon cancer Neg Hx   . Esophageal cancer Neg Hx   . Rectal cancer Neg Hx   . Stomach cancer Neg Hx     Social History Social History   Tobacco Use  . Smoking status: Never Smoker  . Smokeless tobacco: Never Used  Vaping Use  . Vaping Use: Never used  Substance Use Topics  . Alcohol use: No  . Drug use: No     Allergies   Influenza vaccines and Isosorbide   Review of Systems As per HPI   Physical Exam Triage Vital Signs ED Triage Vitals [01/05/20 1554]  Enc Vitals Group     BP (!) 136/87     Pulse Rate 83     Resp 18     Temp 97.9 F (36.6 C)     Temp Source Oral      SpO2 97 %     Weight      Height      Head Circumference      Peak Flow      Pain Score 7     Pain Loc      Pain Edu?      Excl. in GC?    No data found.  Updated Vital Signs BP (!) 136/87 (BP Location: Left Arm)   Pulse 83   Temp 97.9 F (36.6 C) (Oral)   Resp 18   SpO2 97%   Visual Acuity Right Eye Distance:   Left Eye Distance:   Bilateral Distance:    Right Eye Near:   Left Eye Near:    Bilateral Near:     Physical Exam Constitutional:      General: She is not in acute distress.    Appearance: She is obese. She is not ill-appearing or diaphoretic.  HENT:     Head: Normocephalic and atraumatic.     Mouth/Throat:     Mouth: Mucous membranes are moist.     Pharynx: Oropharynx is clear. No oropharyngeal exudate or posterior oropharyngeal erythema.  Eyes:     General: No scleral icterus.    Conjunctiva/sclera: Conjunctivae normal.     Pupils: Pupils are equal, round, and reactive to light.  Neck:     Comments: Trachea midline, negative JVD Cardiovascular:     Rate and Rhythm: Normal rate and regular rhythm.     Heart sounds: No murmur heard.  No gallop.   Pulmonary:     Effort: Pulmonary effort is normal. No respiratory distress.     Breath sounds: No  wheezing, rhonchi or rales.  Chest:     Chest wall: No mass, deformity or crepitus.  Musculoskeletal:     Cervical back: Neck supple. No tenderness.  Lymphadenopathy:     Cervical: No cervical adenopathy.  Skin:    Capillary Refill: Capillary refill takes less than 2 seconds.     Coloration: Skin is not jaundiced or pale.     Findings: No rash.  Neurological:     General: No focal deficit present.     Mental Status: She is alert and oriented to person, place, and time.      UC Treatments / Results  Labs (all labs ordered are listed, but only abnormal results are displayed) Labs Reviewed  NOVEL CORONAVIRUS, NAA    EKG   Radiology No results found.  Procedures Procedures (including critical  care time)  Medications Ordered in UC Medications - No data to display  Initial Impression / Assessment and Plan / UC Course  I have reviewed the triage vital signs and the nursing notes.  Pertinent labs & imaging results that were available during my care of the patient were reviewed by me and considered in my medical decision making (see chart for details).     Patient febrile, nontoxic in office today.  Hemodynamically stable.  Most concerning is patient's medical history: EKG done office, reviewed by me compared to previous from 09/13/2019 - NSR with ventricular rate 82 bpm.  No QTC prolongation, ST elevation or depression.  Waveforms stable in all leads: Nonacute EKG.  Reviewed signs of patient verbalized understanding.  Patient concerned that it could be musculoskeletal despite lack of known trauma or inciting event: We will treat as so at this time, though did stress importance of ER for further evaluation of persistent or worsening symptoms as outlined below.  We will also start Zyrtec, Flonase for sinus pressure.  Covid test pending: Patient quarantine until results are back.  Return precautions discussed, pt verbalized understanding and is agreeable to plan. Final Clinical Impressions(s) / UC Diagnoses   Final diagnoses:  Sinus congestion  Chest pain, unspecified type     Discharge Instructions     May try hot, cold therapy in conjunction with ibuprofen to treat for possible musculoskeletal pain. Recommend Zyrtec, Flonase for sinus congestion. It very important to go to the ER if you develop worsening chest pain, neck pain, shoulder pain, difficulty breathing, weakness.    ED Prescriptions    None     PDMP not reviewed this encounter.   [place], [place], [place] 01/06/20 1032

## 2020-01-05 NOTE — ED Triage Notes (Signed)
Pt presents to [facility] for assessment of left chest pain starting to left chest last night while laying in bed.  Patient c/o constant pain since.  Denies shortness of breath, denies back pain.  Pt c/o left jaw pain and left arm pain.  Pt denies nausea.

## 2020-01-05 NOTE — ED Notes (Signed)
Patient able to ambulate independently  

## 2020-01-05 NOTE — Discharge Instructions (Addendum)
May try hot, cold therapy in conjunction with ibuprofen to treat for possible musculoskeletal pain. Recommend Zyrtec, Flonase for sinus congestion. It very important to go to the ER if you develop worsening chest pain, neck pain, shoulder pain, difficulty breathing, weakness.

## 2020-01-06 LAB — SARS-COV-2, NAA 2 DAY TAT

## 2020-01-06 LAB — NOVEL CORONAVIRUS, NAA: SARS-CoV-2, NAA: NOT DETECTED

## 2020-01-12 ENCOUNTER — Other Ambulatory Visit: Payer: Self-pay

## 2020-01-12 ENCOUNTER — Encounter: Payer: Self-pay | Admitting: Emergency Medicine

## 2020-01-12 ENCOUNTER — Ambulatory Visit
Admission: EM | Admit: 2020-01-12 | Discharge: 2020-01-12 | Disposition: A | Discharge status: Home / Self Care | Payer: BC Managed Care – PPO | Attending: Physician Assistant | Admitting: Physician Assistant

## 2020-01-12 DIAGNOSIS — R0982 Postnasal drip: Secondary | ICD-10-CM

## 2020-01-12 DIAGNOSIS — J3489 Other specified disorders of nose and nasal sinuses: Secondary | ICD-10-CM

## 2020-01-12 DIAGNOSIS — J392 Other diseases of pharynx: Secondary | ICD-10-CM

## 2020-01-12 DIAGNOSIS — Z1152 Encounter for screening for COVID-19: Secondary | ICD-10-CM

## 2020-01-12 MED ORDER — PREDNISONE 50 MG PO TABS
50.0000 mg | ORAL_TABLET | Freq: Every day | ORAL | 0 refills | Status: DC
Start: 2020-01-12 — End: 2020-05-13

## 2020-01-12 MED ORDER — AZELASTINE HCL 0.1 % NA SOLN
2.0000 | Freq: Two times a day (BID) | NASAL | 0 refills | Status: DC
Start: 2020-01-12 — End: 2022-07-13

## 2020-01-12 NOTE — Discharge Instructions (Signed)
COVID PCR testing ordered. I would like you to quarantine until testing results. Prednisone for sinus pressure. Continue azelastine/flonase. Tylenol/motrin for pain and fever. Keep hydrated, urine should be clear to pale yellow in color. If experiencing shortness of breath, trouble breathing, go to the emergency department for further evaluation needed.

## 2020-01-12 NOTE — ED Triage Notes (Signed)
Pt here for scratchy throat and HA starting last night; her daughter whom she lives with is also here for covid testing and pt requests same

## 2020-01-12 NOTE — ED Provider Notes (Signed)
[facility]    CSN: [id number] Arrival date & time: 01/12/20  1136      History   Chief Complaint Chief Complaint  Patient presents with   Sore Throat    HPI PATI THINNES is a 54 y.o. female.   54 year old female comes in for 2 day of URI symptoms. Post nasal drip, throat irritation, frontal pressure. Denies fever, chills, body aches. Denies abdominal pain, nausea, vomiting, diarrhea. Denies shortness of breath, loss of taste/smell. COVID vaccinated.      Past Medical History:  Diagnosis Date   Anxiety    Bursitis    Coronary artery calcification seen on CAT scan    minimal CAD with 30% prox and mild LAD   Depression    Diastolic dysfunction    Dyslipidemia 01/30/2015   Esophageal ring    Fibromyalgia    Hiatal hernia    HSV-1 infection    Morbid obesity (HCC)    Osteoarthritis    Rheumatoid arthritis(714.0)    M05.79   Rheumatoid arthritis, seropositive, 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HCC)    Treated with Orencia, TB neg 09/12/2015   Spondylolysis     Patient Active Problem List   Diagnosis Date Noted   Anxiety 02/20/2018   Fatigue 02/20/2018   Low blood potassium 02/20/2018   Allergic rhinitis 12/14/2017   Fever blister 12/14/2017   Head congestion 12/14/2017   Hyperlipidemia 12/14/2017   Lateral epicondylitis of right elbow 05/04/2017   Sacral pain 03/23/2017   High risk medication use 08/03/2016   Fibromyalgia syndrome 08/03/2016   Abnormal cardiac CT angiography    Sacrococcygeal pain 02/20/2016   Rheumatoid arthritis involving 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HCC) 01/24/2016   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   Dyslipidemia 01/30/2015   MDD (major depressive disorder), recurrent severe, 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   Essential hypertension 08/25/2014   Morbid obesity (HCC) 08/25/2014   CAD (coronary artery disease), native coronary artery 08/24/2014   Numbness and tingling in left arm    Arm numbness left  08/22/2014   Chest pain 08/22/2014   Gastroesophageal reflux disease without esophagitis 07/23/2014   Coronary artery calcification seen on CAT scan 02/19/2014   Hip pain 10/23/2013   Solitary pulmonary nodule 08/28/2013   Chest pain, atypical 07/18/2013   Heart palpitations 06/27/2013   Cough 06/04/2013   Diastolic dysfunction 05/18/2013   SOB (shortness of breath) 05/09/2013   Other malaise and fatigue 01/21/2013   Stress and adjustment reaction 12/09/2012   Right sided abdominal pain 11/29/2012   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/21/2012   Morbid obesity with BMI of 50.0-59.9, adult (HCC) 11/21/2012   Arthritis 07/13/2012   Depression 07/13/2012   Routine health maintenance 05/07/2012   Migraine 06/07/1998    Past Surgical History:  Procedure Laterality Date   CARDIAC CATHETERIZATION N/A 06/11/2016   Procedure: Left Heart Cath and Coronary Angiography;  Surgeon: Corky Crafts, MD;  Location: [facility] INVASIVE CV LAB;  Service: Cardiovascular;  Laterality: N/A;   CESAREAN SECTION     COLONOSCOPY  07/27/2017   ESOPHAGEAL MANOMETRY N/A 10/28/2014   Procedure: ESOPHAGEAL MANOMETRY (EM);  Surgeon: Hilarie Fredrickson, MD;  Location: [facility];  Service: Endoscopy;  Laterality: N/A;   HERNIA REPAIR     reports surgery on 3 hernias, with 2 more present   LAPAROSCOPIC GASTRIC SLEEVE RESECTION  11/21/12   [facility]   LEFT HEART CATHETERIZATION WITH CORONARY ANGIOGRAM N/A 08/26/2014   Procedure: LEFT HEART CATHETERIZATION WITH CORONARY ANGIOGRAM;  Surgeon: Christiane Ha  Erlene Quan, MD;  Location: [facility] CATH LAB;  Service: Cardiovascular;  Laterality: N/A;   TUBAL LIGATION      OB History   No obstetric history on file.      Home Medications    Prior to Admission medications   Medication Sig Start Date End Date Taking? Authorizing Provider  Abatacept (ORENCIA IV) Inject 1,000 mg into the vein every 28 (twenty-eight) days.     [provider]   Acetaminophen-Codeine 300-30 MG tablet acetaminophen 300 mg-codeine 30 mg tablet    [provider]  Adapalene 0.3 % gel Apply 1 application topically daily as needed (acne).  12/31/14   [provider]  Alcohol Swabs (ALCOHOL WIPES) 70 % PADS Alcohol Prep Pads    [provider]  ARIPiprazole (ABILIFY) 2 MG tablet Take 1 tablet (2 mg total) by mouth daily. 04/22/15   Benjaman Pott, MD  atorvastatin (LIPITOR) 80 MG tablet Add'l Sig Add'l Sig oral Add'l Sig 10/10/19   [provider]  azelastine (ASTELIN) 0.1 % nasal spray Place 2 sprays into both nostrils 2 (two) times daily. 01/12/20   Cathie Hoops, Kalya Troeger V, PA-C  Betamethasone Valerate 0.12 % foam Apply 1 application topically daily as needed (itching). Itching  04/22/15   [provider]  buPROPion (WELLBUTRIN XL) 150 MG 24 hr tablet Take 450 mg by mouth every morning. 05/23/19   [provider]  buPROPion (WELLBUTRIN XL) 300 MG 24 hr tablet Take 1 tablet (300 mg total) by mouth daily. 12/28/14   Adonis Brook, NP  cetirizine (ZYRTEC) 10 MG tablet Take 10 mg by mouth daily as needed for allergies.  12/14/17   [provider]  cetirizine-pseudoephedrine (ZYRTEC-D ALLERGY & CONGESTION) 5-120 MG tablet Zyrtec-D 5 mg-120 mg tablet,extended release  TAKE 1 TABLET BY MOUTH EVERY 12 HOURS FOR 10 DAYS    [provider]  clindamycin-benzoyl peroxide (BENZACLIN) gel Apply 1 application topically daily as needed (acne).  02/13/15   [provider]  clobetasol cream (TEMOVATE) 0.05 % Apply 1 application topically daily as needed (skin discoloration).  04/22/15   [provider]  colchicine 0.6 MG tablet Take one tablet by mouth twice daily for 2 weeks 04/25/19   Manson Passey, PA  cyclobenzaprine (FLEXERIL) 10 MG tablet Take 1 tablet by mouth daily at bedtime as needed for muscle spasms. 07/31/19   Gearldine Bienenstock, PA-C  diclofenac Sodium (VOLTAREN) 1 % GEL Apply 2 grams to 4  grams to the affected area up to 4 times daily as needed. 07/31/19   Gearldine Bienenstock, PA-C  diltiazem (CARDIZEM CD) 180 MG 24 hr capsule Take 1 capsule (180 mg total) by mouth daily. 04/16/19 07/31/19  Manson Passey, PA  diltiazem (TIAZAC) 180 MG 24 hr capsule Take by mouth. 08/27/13   [provider]  doxepin (SINEQUAN) 25 MG capsule Take 25 mg by mouth at bedtime as needed. 04/28/19   [provider]  EPINEPHrine 0.3 mg/0.3 mL IJ SOAJ injection epinephrine 0.3 mg/0.3 mL injection, auto-injector 07/02/19   [provider]  esomeprazole (NEXIUM) 20 MG capsule Take one tablet by mouth daily for 2 weeks. 04/25/19   Manson Passey, PA  ezetimibe (ZETIA) 10 MG tablet Take 10 mg by mouth daily. 10/22/19   [provider]  FABB 2.2-25-1 MG TABS TAKE 2 TABLETS BY MOUTH EVERY DAY 07/17/19   Pollyann Savoy, MD  famciclovir (FAMVIR) 500 MG tablet Take 15,000 mg by mouth daily as needed (fever blister).  01/21/15  [provider]  fluticasone (FLONASE) 50 MCG/ACT nasal spray Place 2 sprays into both nostrils daily. 10/09/19   Moshe Cipro, NP  folic acid (FOLVITE) 1 MG tablet TAKE 2 TABLETS BY MOUTH EVERY DAY 05/28/19   Pollyann Savoy, MD  gabapentin (NEURONTIN) 100 MG capsule Take 300 mg by mouth daily.    [provider]  gabapentin (NEURONTIN) 300 MG capsule gabapentin 300 mg capsule  TAKE 1 CAPSULE BY MOUTH THREE TIMES A DAY AS NEEDED FOR PAIN    [provider]  hydrocortisone 2.5 % cream SMARTSIG:1 Topical Every Night 11/06/19   [provider]  hydrOXYzine (VISTARIL) 25 MG capsule Take 25 mg by mouth every 8 (eight) hours as needed for anxiety.  12/30/14   [provider]  ibuprofen (ADVIL) 800 MG tablet ibuprofen 800 mg tablet  TAKE 1 TABLET BY MOUTH THREE TIMES A DAY AS NEEDED    [provider]  levothyroxine (SYNTHROID) 25 MCG tablet levothyroxine 25 mcg tablet  TAKE 1 TABLET IN THE MORNING ON AN  EMPTY STOMACH 30 MINUTES BEFORE FOOD    [provider]  lidocaine (LIDODERM) 5 % PLACE 1 PATCH ONTO THE SKIN DAILY. REMOVE & DISCARD PATCH WITHIN 12 HOURS OR AS DIRECTED BY MD 12/13/19   Pollyann Savoy, MD  LORazepam (ATIVAN) 1 MG tablet Take 1 tablet (1 mg total) by mouth 3 (three) times daily as needed for anxiety. 10/09/19   Moshe Cipro, NP  meloxicam (MOBIC) 15 MG tablet Add'l Sig Add'l Sig oral Add'l Sig 10/10/19   [provider]  methocarbamol (ROBAXIN) 500 MG tablet methocarbamol 500 mg tablet  TAKE 1 TABLET BY MOUTH TWICE A DAY AS NEEDED FOR SPASMS (DAYTIME)    [provider]  mometasone (NASONEX) 50 MCG/ACT nasal spray 2 sprays by Each Nare route nightly. 07/02/19 07/02/20  [provider]  nitroGLYCERIN (NITROSTAT) 0.4 MG SL tablet Place 1 tablet (0.4 mg total) under the tongue every 5 (five) minutes as needed for chest pain. 05/01/19   Rosalio Macadamia, NP  omeprazole (PRILOSEC) 20 MG capsule omeprazole 20 mg capsule,delayed release    [provider]  Oxymetazoline HCl (NASAL SPRAY) 0.05 % SOLN Nasal Decongestant (oxymetazoline) 0.05 % spray    [provider]  pantoprazole (PROTONIX) 20 MG tablet Take by mouth. 07/02/19   [provider]  pantoprazole (PROTONIX) 40 MG tablet Add'l Sig Add'l Sig oral Add'l Sig 10/10/19   [provider]  predniSONE (DELTASONE) 50 MG tablet Take 1 tablet (50 mg total) by mouth daily with breakfast. 01/12/20   Cathie Hoops, Natasha Burda V, PA-C  promethazine-dextromethorphan (PROMETHAZINE-DM) 6.25-15 MG/5ML syrup promethazine-DM 6.25 mg-15 mg/5 mL oral syrup    [provider]  propranolol (INDERAL) 10 MG tablet Take 10 mg by mouth daily.    [provider]  RASUVO 25 MG/0.5ML SOAJ INJECT ONE PEN SUBCUTANEOUSLY ONCE EVERY WEEK. STORE AT ROOM TEMPERATURE BETWEEN 68 - 77 DEGREES F. Patient taking differently: Inject 25 mg into the skin every 7 (seven) days.  11/03/18   Pollyann Savoy,  MD  rosuvastatin (CRESTOR) 20 MG tablet Take 20 mg by mouth. 09/21/16 04/25/19  [provider]  tretinoin (RETIN-A) 0.025 % cream Apply 1 application topically daily as needed (acne).  02/11/15   [provider]  triamcinolone cream (KENALOG) 0.1 % Apply 1 application topically daily as needed (rash). Itching  04/13/15   [provider]  TROKENDI XR 50 MG CP24 Add'l Sig Add'l Sig oral Add'l Sig 10/10/19  [provider]  Vitamin D, Ergocalciferol, (DRISDOL) 1.25 MG (50000 UT) CAPS capsule Take 50,000 Units by mouth See admin instructions. Take 1 tablet (50000 units) by mouth every Monday, Wednesday, Saturday    [provider]  XERESE 5-1 % CREA Apply 1 application topically daily as needed (fever blisters).  12/30/14   [provider]    Family History Family History  Problem Relation Age of Onset   Hyperlipidemia Mother    Depression Mother    Sarcoidosis Father    Lung disease Father        Pleural Mesothelioma   Cancer Father    Heart attack Paternal Grandmother    Hypertension Paternal Grandmother    Hypertension Maternal Grandmother    Diabetes Maternal Grandmother    Stroke Neg Hx    Colon cancer Neg Hx    Esophageal cancer Neg Hx    Rectal cancer Neg Hx    Stomach cancer Neg Hx     Social History Social History   Tobacco Use   Smoking status: Never Smoker   Smokeless tobacco: Never Used  Building services engineer Use: Never used  Substance Use Topics   Alcohol use: No   Drug use: No     Allergies   Influenza vaccines and Isosorbide   Review of Systems Review of Systems  Reason unable to perform ROS: See HPI as above.     Physical Exam Triage Vital Signs ED Triage Vitals  Enc Vitals Group     BP 01/12/20 1155 121/82     Pulse Rate 01/12/20 1155 70     Resp 01/12/20 1155 18     Temp 01/12/20 1155 98 F (36.7 C)     Temp Source 01/12/20 1155 Oral     SpO2 01/12/20 1155 97 %     Weight --       Height --      Head Circumference --      Peak Flow --      Pain Score 01/12/20 1156 4     Pain Loc --      Pain Edu? --      Excl. in GC? --    No data found.  Updated Vital Signs BP 121/82 (BP Location: Right Arm)    Pulse 70    Temp 98 F (36.7 C) (Oral)    Resp 18    SpO2 97%   Physical Exam Constitutional:      General: She is not in acute distress.    Appearance: Normal appearance. She is not ill-appearing, toxic-appearing or diaphoretic.  HENT:     Head: Normocephalic and atraumatic.     Nose:     Right Sinus: Maxillary sinus tenderness and frontal sinus tenderness present.     Left Sinus: Maxillary sinus tenderness and frontal sinus tenderness present.     Mouth/Throat:     Mouth: Mucous membranes are moist.     Pharynx: Oropharynx is clear. Uvula midline.  Cardiovascular:     Rate and Rhythm: Normal rate and regular rhythm.     Heart sounds: Normal heart sounds. No murmur heard.  No friction rub. No gallop.   Pulmonary:     Effort: Pulmonary effort is normal. No accessory muscle usage, prolonged expiration, respiratory distress or retractions.     Comments: Lungs clear to auscultation without adventitious lung sounds. Musculoskeletal:     Cervical back: Normal range of motion and neck supple.  Neurological:     General:  No focal deficit present.     Mental Status: She is alert and oriented to person, place, and time.      UC Treatments / Results  Labs (all labs ordered are listed, but only abnormal results are displayed) Labs Reviewed  NOVEL CORONAVIRUS, NAA    EKG   Radiology No results found.  Procedures Procedures (including critical care time)  Medications Ordered in UC Medications - No data to display  Initial Impression / Assessment and Plan / UC Course  I have reviewed the triage vital signs and the nursing notes.  Pertinent labs & imaging results that were available during my care of the patient were reviewed by me and considered  in my medical decision making (see chart for details).    COVID testing ordered, to quarantine until testing results return.  Patient already on daily azelastine/Flonase, has not improved sinus pressure.  This is causing headaches.  Will provide prescription of prednisone if needed.  Return precautions given.  Final Clinical Impressions(s) / UC Diagnoses   Final diagnoses:  Encounter for screening for COVID-19  Sinus pressure  Throat irritation  Post-nasal drip    ED Prescriptions    Medication Sig Dispense Auth. Provider   azelastine (ASTELIN) 0.1 % nasal spray Place 2 sprays into both nostrils 2 (two) times daily. 30 mL Jena Tegeler V, PA-C   predniSONE (DELTASONE) 50 MG tablet Take 1 tablet (50 mg total) by mouth daily with breakfast. 5 tablet Belinda Fisher, PA-C     PDMP not reviewed this encounter.   Belinda Fisher, PA-C 01/12/20 1244

## 2020-01-13 LAB — NOVEL CORONAVIRUS, NAA: SARS-CoV-2, NAA: NOT DETECTED

## 2020-01-13 LAB — SARS-COV-2, NAA 2 DAY TAT

## 2020-01-16 ENCOUNTER — Ambulatory Visit: Payer: BC Managed Care – PPO | Admitting: Rheumatology

## 2020-01-22 ENCOUNTER — Ambulatory Visit: Payer: BC Managed Care – PPO | Admitting: Podiatry

## 2020-01-31 ENCOUNTER — Other Ambulatory Visit: Payer: Self-pay | Admitting: Rheumatology

## 2020-01-31 NOTE — Telephone Encounter (Signed)
Last Visit: 07/31/19 Next Visit due July 2021. Message sent to the front to schedule.   Okay to refill per Dr. Deveshwar  

## 2020-01-31 NOTE — Telephone Encounter (Signed)
LMOM for patient to call and schedule follow-up appointment.   °

## 2020-01-31 NOTE — Telephone Encounter (Signed)
Please schedule patient for a follow up visit. Patient was due July 2021. Thanks!  

## 2020-02-06 ENCOUNTER — Ambulatory Visit
Admission: EM | Admit: 2020-02-06 | Discharge: 2020-02-06 | Disposition: A | Discharge status: Home / Self Care | Payer: BC Managed Care – PPO | Attending: Emergency Medicine | Admitting: Emergency Medicine

## 2020-02-06 DIAGNOSIS — J069 Acute upper respiratory infection, unspecified: Secondary | ICD-10-CM | POA: Diagnosis not present

## 2020-02-06 DIAGNOSIS — Z1152 Encounter for screening for COVID-19: Secondary | ICD-10-CM

## 2020-02-06 MED ORDER — BENZONATATE 100 MG PO CAPS
100.0000 mg | ORAL_CAPSULE | Freq: Three times a day (TID) | ORAL | 0 refills | Status: DC
Start: 2020-02-06 — End: 2021-04-13

## 2020-02-06 MED ORDER — FLUTICASONE PROPIONATE 50 MCG/ACT NA SUSP
1.0000 | Freq: Every day | NASAL | 0 refills | Status: DC
Start: 2020-02-06 — End: 2021-11-11

## 2020-02-06 MED ORDER — CETIRIZINE HCL 10 MG PO TABS
10.0000 mg | ORAL_TABLET | Freq: Every day | ORAL | 0 refills | Status: DC
Start: 2020-02-06 — End: 2021-08-12

## 2020-02-06 NOTE — ED Triage Notes (Signed)
Pt c/o dry cough, sore throat, chest pain, and diarrhea since Monday.

## 2020-02-06 NOTE — Discharge Instructions (Signed)

## 2020-02-06 NOTE — ED Provider Notes (Signed)
[facility]    CSN: [id number] Arrival date & time: 02/06/20  1023      History   Chief Complaint Chief Complaint  Patient presents with  . Cough  . Chest Pain    HPI [name] [name] is a 54 y.o. female  Presenting for Covid testing.  Endorsing dry cough, sore throat, right-sided chest pain and diarrhea since Monday.  Chest pain is nonexertional, without palpitations, headedness, dizziness, nausea or vomiting.  No known sick contacts, fever, arthralgias or myalgias.  Taking OTC medications with some relief.  Reports good oral intake.  Past Medical History:  Diagnosis Date  . Anxiety   . Bursitis   . Coronary artery calcification seen on CAT scan    minimal CAD with 30% prox and mild LAD  . Depression   . Diastolic dysfunction   . Dyslipidemia 01/30/2015  . Esophageal ring   . Fibromyalgia   . Hiatal hernia   . HSV-1 infection   . Morbid obesity (HCC)   . Osteoarthritis   . Rheumatoid arthritis(714.0)    M05.79  . Rheumatoid arthritis, seropositive, 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HCC)    Treated with Orencia, TB neg 09/12/2015  . Spondylolysis     Patient Active Problem List   Diagnosis Date Noted  . Anxiety 02/20/2018  . Fatigue 02/20/2018  . Low blood potassium 02/20/2018  . Allergic rhinitis 12/14/2017  . Fever blister 12/14/2017  . Head congestion 12/14/2017  . Hyperlipidemia 12/14/2017  . Lateral epicondylitis of right elbow 05/04/2017  . Sacral pain 03/23/2017  . High risk medication use 08/03/2016  . Fibromyalgia syndrome 08/03/2016  . Abnormal cardiac CT angiography   . Sacrococcygeal pain 02/20/2016  . Rheumatoid arthritis involving 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HCC) 01/24/2016  . 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 Dyslipidemia 01/30/2015  . 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 Essential hypertension 08/25/2014  . Morbid obesity (HCC) 08/25/2014  . CAD (coronary artery disease), native coronary artery  08/24/2014  . Numbness and tingling in left arm   . Arm numbness left 08/22/2014  . Chest pain 08/22/2014  . Gastroesophageal reflux disease without esophagitis 07/23/2014  . Coronary artery calcification seen on CAT scan 02/19/2014  . Hip pain 10/23/2013  . Solitary pulmonary nodule 08/28/2013  . Chest pain, atypical 07/18/2013  . Heart palpitations 06/27/2013  . Cough 06/04/2013  . Diastolic dysfunction 05/18/2013  . SOB (shortness of breath) 05/09/2013  . Other malaise and fatigue 01/21/2013  . Stress and adjustment reaction 12/09/2012  . Right sided abdominal pain 11/29/2012  . 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/21/2012  . Morbid obesity with BMI of 50.0-59.9, adult (HCC) 11/21/2012  . Arthritis 07/13/2012  . Depression 07/13/2012  . Routine health maintenance 05/07/2012  . Migraine 06/07/1998    Past Surgical History:  Procedure Laterality Date  . CARDIAC CATHETERIZATION N/A 06/11/2016   Procedure: Left Heart Cath and Coronary Angiography;  Surgeon: Corky Crafts, MD;  Location: [facility] INVASIVE CV LAB;  Service: Cardiovascular;  Laterality: N/A;  . CESAREAN SECTION    . COLONOSCOPY  07/27/2017  . ESOPHAGEAL MANOMETRY N/A 10/28/2014   Procedure: ESOPHAGEAL MANOMETRY (EM);  Surgeon: Hilarie Fredrickson, MD;  Location: [facility] ENDOSCOPY;  Service: Endoscopy;  Laterality: N/A;  . HERNIA REPAIR     reports surgery on 3 hernias, with 2 more present  . LAPAROSCOPIC GASTRIC SLEEVE RESECTION  11/21/12   [facility]  . LEFT HEART CATHETERIZATION WITH CORONARY ANGIOGRAM N/A 08/26/2014   Procedure:  LEFT HEART CATHETERIZATION WITH CORONARY ANGIOGRAM;  Surgeon: Runell Gess, MD;  Location: [facility] CATH LAB;  Service: Cardiovascular;  Laterality: N/A;  . TUBAL LIGATION      OB History   No obstetric history on file.      Home Medications    Prior to Admission medications   Medication Sig Start Date End Date Taking? Authorizing Provider  Abatacept (ORENCIA IV) Inject 1,000 mg into  the vein every 28 (twenty-eight) days.     [provider]  Acetaminophen-Codeine 300-30 MG tablet acetaminophen 300 mg-codeine 30 mg tablet    [provider]  Adapalene 0.3 % gel Apply 1 application topically daily as needed (acne).  12/31/14   [provider]  Alcohol Swabs (ALCOHOL WIPES) 70 % PADS Alcohol Prep Pads    [provider]  ARIPiprazole (ABILIFY) 2 MG tablet Take 1 tablet (2 mg total) by mouth daily. 04/22/15   Benjaman Pott, MD  atorvastatin (LIPITOR) 80 MG tablet Add'l Sig Add'l Sig oral Add'l Sig 10/10/19   [provider]  azelastine (ASTELIN) 0.1 % nasal spray Place 2 sprays into both nostrils 2 (two) times daily. 01/12/20   Cathie Hoops, Amy V, PA-C  benzonatate (TESSALON) 100 MG capsule Take 1 capsule (100 mg total) by mouth every 8 (eight) hours. 02/06/20   Hall-Potvin, [place], PA-C  Betamethasone Valerate 0.12 % foam Apply 1 application topically daily as needed (itching). Itching  04/22/15   [provider]  buPROPion (WELLBUTRIN XL) 150 MG 24 hr tablet Take 450 mg by mouth every morning. 05/23/19   [provider]  buPROPion (WELLBUTRIN XL) 300 MG 24 hr tablet Take 1 tablet (300 mg total) by mouth daily. 12/28/14   Adonis Brook, NP  cetirizine (ZYRTEC ALLERGY) 10 MG tablet Take 1 tablet (10 mg total) by mouth daily. 02/06/20   Hall-Potvin, [place], PA-C  cetirizine-pseudoephedrine (ZYRTEC-D ALLERGY & CONGESTION) 5-120 MG tablet Zyrtec-D 5 mg-120 mg tablet,extended release  TAKE 1 TABLET BY MOUTH EVERY 12 HOURS FOR 10 DAYS    [provider]  clindamycin-benzoyl peroxide (BENZACLIN) gel Apply 1 application topically daily as needed (acne).  02/13/15   [provider]  clobetasol cream (TEMOVATE) 0.05 % Apply 1 application topically daily as needed (skin discoloration).  04/22/15   [provider]  colchicine 0.6 MG tablet Take one tablet by mouth twice daily for 2 weeks 04/25/19   Manson Passey, PA  cyclobenzaprine (FLEXERIL) 10 MG tablet Take 1 tablet by mouth daily at bedtime as needed for muscle spasms. 07/31/19   Gearldine Bienenstock, PA-C  diclofenac Sodium (VOLTAREN) 1 % GEL Apply 2 grams to 4 grams to the affected area up to 4 times daily as needed. 07/31/19   Gearldine Bienenstock, PA-C  diltiazem (CARDIZEM CD) 180 MG 24 hr capsule Take 1 capsule (180 mg total) by mouth daily. 04/16/19 07/31/19  Manson Passey, PA  diltiazem (TIAZAC) 180 MG 24 hr capsule Take by mouth. 08/27/13   [provider]  doxepin (SINEQUAN) 25 MG capsule Take 25 mg by mouth at bedtime as needed. 04/28/19   [provider]  EPINEPHrine 0.3 mg/0.3 mL IJ SOAJ injection epinephrine 0.3 mg/0.3 mL injection, auto-injector 07/02/19   [provider]  esomeprazole (NEXIUM) 20 MG capsule Take one tablet by mouth daily for 2 weeks. 04/25/19   Manson Passey, PA  ezetimibe (ZETIA) 10 MG tablet Take 10 mg by mouth daily. 10/22/19   [provider]  FABB 2.2-25-1 MG  TABS TAKE 2 TABLETS BY MOUTH EVERY DAY 07/17/19   Pollyann Savoy, MD  famciclovir (FAMVIR) 500 MG tablet Take 15,000 mg by mouth daily as needed (fever blister).  01/21/15   [provider]  fluticasone (FLONASE) 50 MCG/ACT nasal spray Place 1 spray into both nostrils daily. 02/06/20   Hall-Potvin, [place], PA-C  folic acid (FOLVITE) 1 MG tablet TAKE 2 TABLETS BY MOUTH EVERY DAY 05/28/19   Pollyann Savoy, MD  gabapentin (NEURONTIN) 100 MG capsule Take 300 mg by mouth daily.    [provider]  gabapentin (NEURONTIN) 300 MG capsule gabapentin 300 mg capsule  TAKE 1 CAPSULE BY MOUTH THREE TIMES A DAY AS NEEDED FOR PAIN    [provider]  hydrocortisone 2.5 % cream SMARTSIG:1 Topical Every Night 11/06/19   [provider]  hydrOXYzine (VISTARIL) 25 MG capsule Take 25 mg by mouth every 8 (eight) hours as needed for anxiety.  12/30/14   [provider]  ibuprofen (ADVIL) 800 MG  tablet ibuprofen 800 mg tablet  TAKE 1 TABLET BY MOUTH THREE TIMES A DAY AS NEEDED    [provider]  levothyroxine (SYNTHROID) 25 MCG tablet levothyroxine 25 mcg tablet  TAKE 1 TABLET IN THE MORNING ON AN EMPTY STOMACH 30 MINUTES BEFORE FOOD    [provider]  lidocaine (LIDODERM) 5 % PLACE 1 PATCH ONTO THE SKIN DAILY. REMOVE & DISCARD PATCH WITHIN 12 HOURS OR AS DIRECTED BY MD 01/31/20   Pollyann Savoy, MD  LORazepam (ATIVAN) 1 MG tablet Take 1 tablet (1 mg total) by mouth 3 (three) times daily as needed for anxiety. 10/09/19   Moshe Cipro, NP  meloxicam (MOBIC) 15 MG tablet Add'l Sig Add'l Sig oral Add'l Sig 10/10/19   [provider]  methocarbamol (ROBAXIN) 500 MG tablet methocarbamol 500 mg tablet  TAKE 1 TABLET BY MOUTH TWICE A DAY AS NEEDED FOR SPASMS (DAYTIME)    [provider]  mometasone (NASONEX) 50 MCG/ACT nasal spray 2 sprays by Each Nare route nightly. 07/02/19 07/02/20  [provider]  nitroGLYCERIN (NITROSTAT) 0.4 MG SL tablet Place 1 tablet (0.4 mg total) under the tongue every 5 (five) minutes as needed for chest pain. 05/01/19   Rosalio Macadamia, NP  omeprazole (PRILOSEC) 20 MG capsule omeprazole 20 mg capsule,delayed release    [provider]  Oxymetazoline HCl (NASAL SPRAY) 0.05 % SOLN Nasal Decongestant (oxymetazoline) 0.05 % spray    [provider]  pantoprazole (PROTONIX) 20 MG tablet Take by mouth. 07/02/19   [provider]  pantoprazole (PROTONIX) 40 MG tablet Add'l Sig Add'l Sig oral Add'l Sig 10/10/19   [provider]  predniSONE (DELTASONE) 50 MG tablet Take 1 tablet (50 mg total) by mouth daily with breakfast. 01/12/20   Cathie Hoops, Amy V, PA-C  promethazine-dextromethorphan (PROMETHAZINE-DM) 6.25-15 MG/5ML syrup promethazine-DM 6.25 mg-15 mg/5 mL oral syrup    [provider]  propranolol (INDERAL) 10 MG tablet Take 10 mg by mouth daily.    [provider]  RASUVO 25  MG/0.5ML SOAJ INJECT ONE PEN SUBCUTANEOUSLY ONCE EVERY WEEK. STORE AT ROOM TEMPERATURE BETWEEN 68 - 77 DEGREES F. Patient taking differently: Inject 25 mg into the skin every 7 (seven) days.  11/03/18   Pollyann Savoy, MD  rosuvastatin (CRESTOR) 20 MG tablet Take 20 mg by mouth. 09/21/16 04/25/19  [provider]  tretinoin (RETIN-A) 0.025 % cream Apply 1 application topically daily as needed (acne).  02/11/15   [provider]  triamcinolone  cream (KENALOG) 0.1 % Apply 1 application topically daily as needed (rash). Itching  04/13/15   [provider]  TROKENDI XR 50 MG CP24 Add'l Sig Add'l Sig oral Add'l Sig 10/10/19   [provider]  Vitamin D, Ergocalciferol, (DRISDOL) 1.25 MG (50000 UT) CAPS capsule Take 50,000 Units by mouth See admin instructions. Take 1 tablet (50000 units) by mouth every Monday, Wednesday, Saturday    [provider]  XERESE 5-1 % CREA Apply 1 application topically daily as needed (fever blisters).  12/30/14   [provider]    Family History Family History  Problem Relation Age of Onset  . Hyperlipidemia Mother   . Depression Mother   . Sarcoidosis Father   . Lung disease Father        Pleural Mesothelioma  . Cancer Father   . Heart attack Paternal Grandmother   . Hypertension Paternal Grandmother   . Hypertension Maternal Grandmother   . Diabetes Maternal Grandmother   . Stroke Neg Hx   . Colon cancer Neg Hx   . Esophageal cancer Neg Hx   . Rectal cancer Neg Hx   . Stomach cancer Neg Hx     Social History Social History   Tobacco Use  . Smoking status: Never Smoker  . Smokeless tobacco: Never Used  Vaping Use  . Vaping Use: Never used  Substance Use Topics  . Alcohol use: No  . Drug use: No     Allergies   Influenza vaccines and Isosorbide   Review of Systems As per HPI   Physical Exam Triage Vital Signs ED Triage Vitals  Enc Vitals Group     BP 02/06/20 1046 108/75     Pulse Rate  02/06/20 1046 80     Resp 02/06/20 1046 20     Temp 02/06/20 1046 98.1 F (36.7 C)     Temp Source 02/06/20 1046 Oral     SpO2 02/06/20 1046 98 %     Weight --      Height --      Head Circumference --      Peak Flow --      Pain Score 02/06/20 1104 5     Pain Loc --      Pain Edu? --      Excl. in GC? --    No data found.  Updated Vital Signs BP 108/75 (BP Location: Left Arm)   Pulse 80   Temp 98.1 F (36.7 C) (Oral)   Resp 20   SpO2 98%   Visual Acuity Right Eye Distance:   Left Eye Distance:   Bilateral Distance:    Right Eye Near:   Left Eye Near:    Bilateral Near:     Physical Exam Constitutional:      General: She is not in acute distress.    Appearance: She is obese. She is not ill-appearing or diaphoretic.  HENT:     Head: Normocephalic and atraumatic.     Mouth/Throat:     Mouth: Mucous membranes are moist.     Pharynx: Oropharynx is clear. No oropharyngeal exudate or posterior oropharyngeal erythema.  Eyes:     General: No scleral icterus.    Conjunctiva/sclera: Conjunctivae normal.     Pupils: Pupils are equal, round, and reactive to light.  Neck:     Comments: Trachea midline, negative JVD Cardiovascular:     Rate and Rhythm: Normal rate and regular rhythm.     Heart sounds: No murmur heard.  No gallop.   Pulmonary:     Effort: Pulmonary effort is normal. No respiratory distress.     Breath sounds: No wheezing, rhonchi or rales.  Musculoskeletal:     Cervical back: Neck supple. No tenderness.  Lymphadenopathy:     Cervical: No cervical adenopathy.  Skin:    Capillary Refill: Capillary refill takes less than 2 seconds.     Coloration: Skin is not jaundiced or pale.     Findings: No rash.  Neurological:     General: No focal deficit present.     Mental Status: She is alert and oriented to person, place, and time.      UC Treatments / Results  Labs (all labs ordered are listed, but only abnormal results are displayed) Labs Reviewed    NOVEL CORONAVIRUS, NAA    EKG   Radiology No results found.  Procedures Procedures (including critical care time)  Medications Ordered in UC Medications - No data to display  Initial Impression / Assessment and Plan / UC Course  I have reviewed the triage vital signs and the nursing notes.  Pertinent labs & imaging results that were available during my care of the patient were reviewed by me and considered in my medical decision making (see chart for details).     Patient afebrile, nontoxic, and hemodynamically stable in office.  EKG done office, reviewed by me compared to previous from 01/07/2020: NSR with ventricular rate 69 bpm.  No QTC prolongation.  Waveforms stable in all leads-nonacute.  Covid pending: We will treat supportively as outlined below.  Return precautions discussed, pt verbalized understanding and is agreeable to plan. Final Clinical Impressions(s) / UC Diagnoses   Final diagnoses:  Encounter for screening for COVID-19  URI with cough and congestion     Discharge Instructions     Tessalon for cough. Start flonase, atrovent nasal spray for nasal congestion/drainage. You can use over the counter nasal saline rinse such as neti pot for nasal congestion. Keep hydrated, your urine should be clear to pale yellow in color. Tylenol/motrin for fever and pain. Monitor for any worsening of symptoms, chest pain, shortness of breath, wheezing, swelling of the throat, go to the emergency department for further evaluation needed.     ED Prescriptions    Medication Sig Dispense Auth. Provider   cetirizine (ZYRTEC ALLERGY) 10 MG tablet Take 1 tablet (10 mg total) by mouth daily. 30 tablet Hall-Potvin, [place], PA-C   fluticasone (FLONASE) 50 MCG/ACT nasal spray Place 1 spray into both nostrils daily. 16 g Hall-Potvin, [place], PA-C   benzonatate (TESSALON) 100 MG capsule Take 1 capsule (100 mg total) by mouth every 8 (eight) hours. 21 capsule Hall-Potvin, [place], PA-C      PDMP not reviewed this encounter.   Hall-Potvin, [place], [place] 02/06/20 1332

## 2020-02-09 LAB — NOVEL CORONAVIRUS, NAA: SARS-CoV-2, NAA: NOT DETECTED

## 2020-03-26 ENCOUNTER — Ambulatory Visit (HOSPITAL_COMMUNITY)
Admission: RE | Admit: 2020-03-26 | Discharge: 2020-03-26 | Disposition: A | Discharge status: Home / Self Care | Payer: BC Managed Care – PPO | Source: Ambulatory Visit | Attending: Rheumatology | Admitting: Rheumatology

## 2020-03-26 ENCOUNTER — Other Ambulatory Visit: Payer: Self-pay

## 2020-03-26 DIAGNOSIS — M0579 Rheumatoid arthritis with rheumatoid factor of multiple sites without organ or systems involvement: Secondary | ICD-10-CM | POA: Insufficient documentation

## 2020-03-26 LAB — COMPREHENSIVE METABOLIC PANEL
ALT: 15 U/L (ref 0–44)
AST: 23 U/L (ref 15–41)
Albumin: 3.3 g/dL — ABNORMAL LOW (ref 3.5–5.0)
Alkaline Phosphatase: 72 U/L (ref 38–126)
Anion gap: 9 (ref 5–15)
BUN: 14 mg/dL (ref 6–20)
CO2: 26 mmol/L (ref 22–32)
Calcium: 9.2 mg/dL (ref 8.9–10.3)
Chloride: 105 mmol/L (ref 98–111)
Creatinine, Ser: 0.88 mg/dL (ref 0.44–1.00)
GFR, Estimated: >60 mL/min (ref >60)
Glucose, Bld: 90 mg/dL (ref 70–99)
Potassium: 4 mmol/L (ref 3.5–5.1)
Sodium: 140 mmol/L (ref 135–145)
Total Bilirubin: 0.6 mg/dL (ref 0.3–1.2)
Total Protein: 6.4 g/dL — ABNORMAL LOW (ref 6.5–8.1)

## 2020-03-26 LAB — CBC
HCT: 42.6 % (ref 36.0–46.0)
Hemoglobin: 13 g/dL (ref 12.0–15.0)
MCH: 25.9 pg — ABNORMAL LOW (ref 26.0–34.0)
MCHC: 30.5 g/dL (ref 30.0–36.0)
MCV: 85 fL (ref 80.0–100.0)
Platelets: 258 10*3/uL (ref 150–400)
RBC: 5.01 MIL/uL (ref 3.87–5.11)
RDW: 12.6 % (ref 11.5–15.5)
WBC: 8.4 10*3/uL (ref 4.0–10.5)
nRBC: 0 % (ref 0.0–0.2)

## 2020-03-26 MED ORDER — DIPHENHYDRAMINE HCL 25 MG PO CAPS: 25.0000 mg | ORAL_CAPSULE | ORAL | Status: AC

## 2020-03-26 MED ORDER — ACETAMINOPHEN 325 MG PO TABS
ORAL_TABLET | ORAL | Status: AC
  Administered 2020-03-26: 650 mg via ORAL
  Filled 2020-03-26: qty 2

## 2020-03-26 MED ORDER — DIPHENHYDRAMINE HCL 25 MG PO CAPS
ORAL_CAPSULE | ORAL | Status: AC
  Administered 2020-03-26: 25 mg via ORAL
  Filled 2020-03-26: qty 1

## 2020-03-26 MED ORDER — SODIUM CHLORIDE 0.9 % IV SOLN
1000.0000 mg | INTRAVENOUS | Status: DC
  Administered 2020-03-26: 1000 mg via INTRAVENOUS
  Filled 2020-03-26: qty 40

## 2020-03-26 MED ORDER — ACETAMINOPHEN 325 MG PO TABS: 650.0000 mg | ORAL_TABLET | ORAL | Status: AC

## 2020-03-26 NOTE — Progress Notes (Signed)
CBC is normal.  Total protein and albumin are low due to chronic disease.

## 2020-03-27 NOTE — Progress Notes (Signed)
We will change it for the future orders.

## 2020-04-14 ENCOUNTER — Other Ambulatory Visit: Payer: Self-pay | Admitting: Rheumatology

## 2020-04-18 ENCOUNTER — Ambulatory Visit
Admission: EM | Admit: 2020-04-18 | Discharge: 2020-04-18 | Disposition: A | Discharge status: Home / Self Care | Payer: BC Managed Care – PPO | Attending: Emergency Medicine | Admitting: Emergency Medicine

## 2020-04-18 ENCOUNTER — Encounter: Payer: Self-pay | Admitting: Emergency Medicine

## 2020-04-18 ENCOUNTER — Other Ambulatory Visit: Payer: Self-pay

## 2020-04-18 DIAGNOSIS — M25561 Pain in right knee: Secondary | ICD-10-CM | POA: Diagnosis not present

## 2020-04-18 DIAGNOSIS — M05761 Rheumatoid arthritis with rheumatoid factor of right knee without organ or systems involvement: Secondary | ICD-10-CM | POA: Diagnosis not present

## 2020-04-18 MED ORDER — ACETAMINOPHEN-CODEINE 300-30 MG PO TABS: 1.0000 | ORAL_TABLET | Freq: Four times a day (QID) | ORAL | 0 refills | Status: AC | PRN

## 2020-04-18 NOTE — ED Provider Notes (Signed)
[facility]    CSN: [id number] Arrival date & time: 04/18/20  1554      History   Chief Complaint Chief Complaint  Patient presents with  . Knee Pain    x 2 weeks    HPI [name] [name] is a 54 y.o. female  Presenting for acute on chronic right knee pain.  Patient has extensive history as below.  Per chart review, last infusion for RA was 03/26/2020: Tolerated this well.  Has tried numerous OTC medications, gels, wraps without relief.  States Mobic has not been helpful in the past.  Requesting Tylenol with codeine as she is tolerated this well, though has not used it "in years".  Has appointment with ortho next week.  Past Medical History:  Diagnosis Date  . Anxiety   . Bursitis   . Coronary artery calcification seen on CAT scan    minimal CAD with 30% prox and mild LAD  . Depression   . Diastolic dysfunction   . Dyslipidemia 01/30/2015  . Esophageal ring   . Fibromyalgia   . Hiatal hernia   . HSV-1 infection   . Morbid obesity (HCC)   . Osteoarthritis   . Rheumatoid arthritis(714.0)    M05.79  . Rheumatoid arthritis, seropositive, 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HCC)    Treated with Orencia, TB neg 09/12/2015  . Spondylolysis     Patient Active Problem List   Diagnosis Date Noted  . Anxiety 02/20/2018  . Fatigue 02/20/2018  . Low blood potassium 02/20/2018  . Allergic rhinitis 12/14/2017  . Fever blister 12/14/2017  . Head congestion 12/14/2017  . Hyperlipidemia 12/14/2017  . Lateral epicondylitis of right elbow 05/04/2017  . Sacral pain 03/23/2017  . High risk medication use 08/03/2016  . Fibromyalgia syndrome 08/03/2016  . Abnormal cardiac CT angiography   . Sacrococcygeal pain 02/20/2016  . Rheumatoid arthritis involving 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HCC) 01/24/2016  . 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 Dyslipidemia 01/30/2015  . 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 Essential hypertension 08/25/2014  .  Morbid obesity (HCC) 08/25/2014  . CAD (coronary artery disease), native coronary artery 08/24/2014  . Numbness and tingling in left arm   . Arm numbness left 08/22/2014  . Chest pain 08/22/2014  . Gastroesophageal reflux disease without esophagitis 07/23/2014  . Coronary artery calcification seen on CAT scan 02/19/2014  . Hip pain 10/23/2013  . Solitary pulmonary nodule 08/28/2013  . Chest pain, atypical 07/18/2013  . Heart palpitations 06/27/2013  . Cough 06/04/2013  . Diastolic dysfunction 05/18/2013  . SOB (shortness of breath) 05/09/2013  . Other malaise and fatigue 01/21/2013  . Stress and adjustment reaction 12/09/2012  . Right sided abdominal pain 11/29/2012  . 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/21/2012  . Morbid obesity with BMI of 50.0-59.9, adult (HCC) 11/21/2012  . Arthritis 07/13/2012  . Depression 07/13/2012  . Routine health maintenance 05/07/2012  . Migraine 06/07/1998    Past Surgical History:  Procedure Laterality Date  . CARDIAC CATHETERIZATION N/A 06/11/2016   Procedure: Left Heart Cath and Coronary Angiography;  Surgeon: Corky Crafts, MD;  [facility];  Service: Cardiovascular;  Laterality: N/A;  . CESAREAN SECTION    . COLONOSCOPY  07/27/2017  . ESOPHAGEAL MANOMETRY N/A 10/28/2014   Procedure: ESOPHAGEAL MANOMETRY (EM);  Surgeon: Hilarie Fredrickson, MD;  Location: [facility] ENDOSCOPY;  Service: Endoscopy;  Laterality: N/A;  . HERNIA REPAIR     reports surgery on 3 hernias, with 2 more  present  . LAPAROSCOPIC GASTRIC SLEEVE RESECTION  11/21/12   [facility]  . LEFT HEART CATHETERIZATION WITH CORONARY ANGIOGRAM N/A 08/26/2014   Procedure: LEFT HEART CATHETERIZATION WITH CORONARY ANGIOGRAM;  Surgeon: Runell Gess, MD;  Location: [facility] CATH LAB;  Service: Cardiovascular;  Laterality: N/A;  . TUBAL LIGATION      OB History   No obstetric history on file.      Home Medications    Prior to Admission medications   Medication Sig  Start Date End Date Taking? Authorizing Provider  Alcohol Swabs (ALCOHOL WIPES) 70 % PADS Alcohol Prep Pads   Yes [provider]  ARIPiprazole (ABILIFY) 2 MG tablet Take 1 tablet (2 mg total) by mouth daily. 04/22/15  Yes Benjaman Pott, MD  atorvastatin (LIPITOR) 80 MG tablet Add'l Sig Add'l Sig oral Add'l Sig 10/10/19  Yes [provider]  azelastine (ASTELIN) 0.1 % nasal spray Place 2 sprays into both nostrils 2 (two) times daily. 01/12/20  Yes Yu, Amy V, PA-C  benzonatate (TESSALON) 100 MG capsule Take 1 capsule (100 mg total) by mouth every 8 (eight) hours. 02/06/20  Yes Hall-Potvin, [place], PA-C  buPROPion (WELLBUTRIN XL) 150 MG 24 hr tablet Take 450 mg by mouth every morning. 05/23/19  Yes [provider]  cetirizine (ZYRTEC ALLERGY) 10 MG tablet Take 1 tablet (10 mg total) by mouth daily. 02/06/20  Yes Hall-Potvin, [place], PA-C  cetirizine-pseudoephedrine (ZYRTEC-D ALLERGY & CONGESTION) 5-120 MG tablet Zyrtec-D 5 mg-120 mg tablet,extended release  TAKE 1 TABLET BY MOUTH EVERY 12 HOURS FOR 10 DAYS   Yes [provider]  cyclobenzaprine (FLEXERIL) 10 MG tablet Take 1 tablet by mouth daily at bedtime as needed for muscle spasms. 07/31/19  Yes Gearldine Bienenstock, PA-C  diltiazem [facility]) 180 MG 24 hr capsule Take by mouth. 08/27/13  Yes [provider]  gabapentin (NEURONTIN) 100 MG capsule Take 300 mg by mouth daily.   Yes [provider]  gabapentin (NEURONTIN) 300 MG capsule gabapentin 300 mg capsule  TAKE 1 CAPSULE BY MOUTH THREE TIMES A DAY AS NEEDED FOR PAIN   Yes [provider]  Abatacept (ORENCIA IV) Inject 1,000 mg into the vein every 28 (twenty-eight) days.     [provider]  Acetaminophen-Codeine 300-30 MG tablet Take 1 tablet by mouth every 6 (six) hours as needed for up to 3 days for pain. 04/18/20 04/21/20  Hall-Potvin, [place], PA-C  Adapalene 0.3 % gel Apply 1 application topically daily as needed (acne).   12/31/14   [provider]  buPROPion (WELLBUTRIN XL) 300 MG 24 hr tablet Take 1 tablet (300 mg total) by mouth daily. 12/28/14   Adonis Brook, NP  clindamycin-benzoyl peroxide (BENZACLIN) gel Apply 1 application topically daily as needed (acne).  02/13/15   [provider]  clobetasol cream (TEMOVATE) 0.05 % Apply 1 application topically daily as needed (skin discoloration).  04/22/15   [provider]  colchicine 0.6 MG tablet Take one tablet by mouth twice daily for 2 weeks 04/25/19   Manson Passey, PA  diclofenac Sodium (VOLTAREN) 1 % GEL Apply 2 grams to 4 grams to the affected area up to 4 times daily as needed. 07/31/19   Gearldine Bienenstock, PA-C  diltiazem (CARDIZEM CD) 180 MG 24 hr capsule Take 1 capsule (180 mg total) by mouth daily. 04/16/19 07/31/19  Bhagat, Sharrell Ku, PA  doxepin (SINEQUAN) 25 MG capsule Take 25 mg by mouth at bedtime as needed. 04/28/19   [provider]  EPINEPHrine 0.3 mg/0.3 mL IJ SOAJ injection epinephrine 0.3 mg/0.3 mL injection, auto-injector 07/02/19   [provider]  esomeprazole (NEXIUM) 20 MG capsule Take one tablet by mouth daily for 2 weeks. 04/25/19   Manson Passey, PA  ezetimibe (ZETIA) 10 MG tablet Take 10 mg by mouth daily. 10/22/19   [provider]  FABB 2.2-25-1 MG TABS TAKE 2 TABLETS BY MOUTH EVERY DAY 07/17/19   Pollyann Savoy, MD  famciclovir (FAMVIR) 500 MG tablet Take 15,000 mg by mouth daily as needed (fever blister).  01/21/15   [provider]  fluticasone (FLONASE) 50 MCG/ACT nasal spray Place 1 spray into both nostrils daily. 02/06/20   Hall-Potvin, [place], PA-C  folic acid (FOLVITE) 1 MG tablet TAKE 2 TABLETS BY MOUTH EVERY DAY 05/28/19   Pollyann Savoy, MD  hydrocortisone 2.5 % cream SMARTSIG:1 Topical Every Night 11/06/19   [provider]  ibuprofen (ADVIL) 800 MG tablet ibuprofen 800 mg tablet  TAKE 1 TABLET BY MOUTH THREE TIMES A DAY AS NEEDED    [provider]  levothyroxine (SYNTHROID) 25 MCG tablet levothyroxine 25 mcg tablet  TAKE 1 TABLET IN THE MORNING ON AN EMPTY STOMACH 30 MINUTES BEFORE FOOD    [provider]  lidocaine (LIDODERM) 5 % PLACE 1 PATCH ONTO THE SKIN DAILY. REMOVE & DISCARD PATCH WITHIN 12 HOURS OR AS DIRECTED BY MD 01/31/20   Pollyann Savoy, MD  LORazepam (ATIVAN) 1 MG tablet Take 1 tablet (1 mg total) by mouth 3 (three) times daily as needed for anxiety. 10/09/19   Moshe Cipro, NP  methocarbamol (ROBAXIN) 500 MG tablet methocarbamol 500 mg tablet  TAKE 1 TABLET BY MOUTH TWICE A DAY AS NEEDED FOR SPASMS (DAYTIME)    [provider]  mometasone (NASONEX) 50 MCG/ACT nasal spray 2 sprays by Each Nare route nightly. 07/02/19 07/02/20  [provider]  nitroGLYCERIN (NITROSTAT) 0.4 MG SL tablet Place 1 tablet (0.4 mg total) under the tongue every 5 (five) minutes as needed for chest pain. 05/01/19   Rosalio Macadamia, NP  omeprazole (PRILOSEC) 20 MG capsule omeprazole 20 mg capsule,delayed release    [provider]  Oxymetazoline HCl (NASAL SPRAY) 0.05 % SOLN Nasal Decongestant (oxymetazoline) 0.05 % spray    [provider]  predniSONE (DELTASONE) 50 MG tablet Take 1 tablet (50 mg total) by mouth daily with breakfast. 01/12/20   Cathie Hoops, Amy V, PA-C  promethazine-dextromethorphan (PROMETHAZINE-DM) 6.25-15 MG/5ML syrup promethazine-DM 6.25 mg-15 mg/5 mL oral syrup    [provider]  propranolol (INDERAL) 10 MG tablet Take 10 mg by mouth daily.    [provider]  RASUVO 25 MG/0.5ML SOAJ INJECT ONE PEN SUBCUTANEOUSLY ONCE EVERY WEEK. STORE AT ROOM TEMPERATURE BETWEEN 68 - 77 DEGREES F. Patient taking differently: Inject 25 mg into the skin every 7 (seven) days.  11/03/18   Pollyann Savoy, MD  rosuvastatin (CRESTOR) 20 MG tablet Take 20 mg by mouth. 09/21/16 04/25/19  [provider]  tretinoin (RETIN-A) 0.025 % cream Apply 1 application topically daily  as needed (acne).  02/11/15   [provider]  triamcinolone cream (KENALOG) 0.1 % Apply 1 application topically daily as needed (rash). Itching  04/13/15   [provider]  TROKENDI XR 50 MG CP24 Add'l Sig Add'l Sig oral Add'l Sig 10/10/19   [provider]  Vitamin D, Ergocalciferol, (DRISDOL) 1.25 MG (50000 UT) CAPS capsule Take 50,000 Units by mouth See admin instructions. Take 1 tablet (50000 units)  by mouth every Monday, Wednesday, Saturday    [provider]  XERESE 5-1 % CREA Apply 1 application topically daily as needed (fever blisters).  12/30/14   [provider]    Family History Family History  Problem Relation Age of Onset  . Hyperlipidemia Mother   . Depression Mother   . Sarcoidosis Father   . Lung disease Father        Pleural Mesothelioma  . Cancer Father   . Heart attack Paternal Grandmother   . Hypertension Paternal Grandmother   . Hypertension Maternal Grandmother   . Diabetes Maternal Grandmother   . Stroke Neg Hx   . Colon cancer Neg Hx   . Esophageal cancer Neg Hx   . Rectal cancer Neg Hx   . Stomach cancer Neg Hx     Social History Social History   Tobacco Use  . Smoking status: Never Smoker  . Smokeless tobacco: Never Used  Vaping Use  . Vaping Use: Never used  Substance Use Topics  . Alcohol use: No  . Drug use: No     Allergies   Influenza vaccines and Isosorbide   Review of Systems Review of Systems  Constitutional: Negative for fatigue and fever.  Respiratory: Negative for cough and shortness of breath.   Cardiovascular: Negative for chest pain and palpitations.  Musculoskeletal:       Positive for right knee pain  Neurological: Negative for weakness and numbness.     Physical Exam Triage Vital Signs ED Triage Vitals  Enc Vitals Group     BP      Pulse      Resp      Temp      Temp src      SpO2      Weight      Height      Head Circumference      Peak Flow      Pain Score       Pain Loc      Pain Edu?      Excl. in GC?    No data found.  Updated Vital Signs BP (!) 130/93 (BP Location: Right Arm)   Pulse 75   Temp 98.3 F (36.8 C) (Oral)   Resp 20   SpO2 95%   Visual Acuity Right Eye Distance:   Left Eye Distance:   Bilateral Distance:    Right Eye Near:   Left Eye Near:    Bilateral Near:     Physical Exam Constitutional:      General: She is not in acute distress. HENT:     Head: Normocephalic and atraumatic.  Eyes:     General: No scleral icterus.    Pupils: Pupils are equal, round, and reactive to light.  Cardiovascular:     Rate and Rhythm: Normal rate.  Pulmonary:     Effort: Pulmonary effort is normal.  Musculoskeletal:        General: Swelling and tenderness present. No deformity.     Comments: R knee w/ diffuse TTP.  No obvious effusion.  Moderate crepitus.  Patella stable.  NVI  Skin:    General: Skin is warm.     Capillary Refill: Capillary refill takes less than 2 seconds.     Coloration: Skin is not jaundiced or pale.     Findings: No bruising, erythema or rash.  Neurological:     General: No focal deficit present.     Mental Status: She is alert and  oriented to person, place, and time.      UC Treatments / Results  Labs (all labs ordered are listed, but only abnormal results are displayed) Labs Reviewed - No data to display  EKG   Radiology No results found.  Procedures Procedures (including critical care time)  Medications Ordered in UC Medications - No data to display  Initial Impression / Assessment and Plan / UC Course  I have reviewed the triage vital signs and the nursing notes.  Pertinent labs & imaging results that were available during my care of the patient were reviewed by me and considered in my medical decision making (see chart for details).     Patient with extensive rheumatologic history and chronic pain in affected knee.  Does have follow-up with specialist next week.  No obvious  deformity, effusion on exam, nor does she report history of trauma.  Willing to refill narcotic short-term. Crutches given to help take weight off joint.  Return precautions discussed, pt verbalized understanding and is agreeable to plan. 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 Keep follow up with ortho on Monday!    ED Prescriptions    Medication Sig Dispense Auth. Provider   Acetaminophen-Codeine 300-30 MG tablet Take 1 tablet by mouth every 6 (six) hours as needed for up to 3 days for pain. 12 tablet Hall-Potvin, [place], PA-C     I have reviewed the PDMP during this encounter.   Hall-Potvin, [place], [place] 04/18/20 1703

## 2020-04-18 NOTE — ED Triage Notes (Signed)
Pt states she has been experiencing worsening knee pain x 2-3 weeks and has not had any improvement with otc or prescription meds to this point. Pt has an [facility] appointment on Monday. Pt is aox4 and ambulates with a limp.

## 2020-04-18 NOTE — Discharge Instructions (Addendum)
Ice, elevate, use crutches as needed. Keep follow up with ortho on Monday!

## 2020-04-21 ENCOUNTER — Encounter: Payer: Self-pay | Admitting: Physician Assistant

## 2020-04-21 ENCOUNTER — Ambulatory Visit (INDEPENDENT_AMBULATORY_CARE_PROVIDER_SITE_OTHER): Payer: BC Managed Care – PPO | Admitting: Physician Assistant

## 2020-04-21 ENCOUNTER — Ambulatory Visit: Payer: Self-pay

## 2020-04-21 VITALS — Ht 62.5 in | Wt 268.4 lb

## 2020-04-21 DIAGNOSIS — M25561 Pain in right knee: Secondary | ICD-10-CM | POA: Diagnosis not present

## 2020-04-21 MED ORDER — LIDOCAINE HCL 1 % IJ SOLN
3.0000 mL | INTRAMUSCULAR | Status: AC | PRN
  Administered 2020-04-21: 3 mL

## 2020-04-21 MED ORDER — METHYLPREDNISOLONE ACETATE 40 MG/ML IJ SUSP
40.0000 mg | INTRAMUSCULAR | Status: AC | PRN
  Administered 2020-04-21: 40 mg via INTRA_ARTICULAR

## 2020-04-21 NOTE — Progress Notes (Signed)
Office Visit Note   Patient: [name] [name]           Date of Birth: 12-21-1965           MRN: [id number] Visit Date: 04/21/2020              Requested by: [name], MD [address] [place],  [place] [address] PCP: [name], MD   Assessment & Plan: Visit Diagnoses:  1. Right knee pain, unspecified chronicity     Plan: We will see her back in 2 weeks to see what type of response she had to the right knee injection.  She may need a MRI if her pain continues especially with giving way sensation of the knee.  She can continue the Voltaren gel to her knee 3-4 times daily.  Follow-Up Instructions: Return in about 2 weeks (around 05/05/2020).   Orders:  Orders Placed This Encounter  Procedures  . Large Joint Inj  . XR Knee 1-2 Views Right   No orders of the defined types were placed in this encounter.     Procedures: Large Joint Inj on 04/21/2020 5:13 PM Indications: pain Details: 22 G 1.5 in needle, anterolateral approach  Arthrogram: No  Medications: 3 mL lidocaine 1 %; 40 mg methylPREDNISolone acetate 40 MG/ML Outcome: tolerated well, no immediate complications Procedure, treatment alternatives, risks and benefits explained, specific risks discussed. Consent was given by the patient. Immediately prior to procedure a time out was called to verify the correct patient, procedure, equipment, support staff and site/side marked as required. Patient was prepped and draped in the usual sterile fashion.       Clinical Data: No additional findings.   Subjective: Chief Complaint  Patient presents with  . Right Knee - Pain    HPI [name] is a pleasant 54 year old female were seen for right knee pain.  She has been seen in the past by Dr. Otelia Sergeant for her back and SI joints.  Also she sees Dr. Corliss Skains for seropositive rheumatoid arthritis and fibromyalgia.  She states she has been having pain in her knee for several months now.  Reports that  her son was killed in a auto accident several months ago and when she learned of the note is that he had passed away she she ran out of the hospital and feels that this started knee pain.  Had no other injury to the knee.  Does report history of supplemental injection in her knee years ago with good well. Review of Systems  Negative for fevers or chills.   Objective: Vital Signs: Ht 5' 2.5" (1.588 m)   Wt 268 lb 6.4 oz (121.7 kg)   BMI 48.31 kg/m   Physical Exam Constitutional:      Appearance: She is not ill-appearing or diaphoretic.  Pulmonary:     Effort: Pulmonary effort is normal.  Neurological:     Mental Status: She is alert and oriented to person, place, and time.  Psychiatric:        Mood and Affect: Mood normal.     Ortho Exam Right knee she has full extension and is able do straight leg raise.  Flexion only to 90 degrees forward flexion causes significant pain and unable to flex her past 100 degrees.  She has tenderness along medial joint line.  No instability valgus varus stressing.  No abnormal warmth or erythema.  Specialty Comments:  No specialty comments available.  Imaging: XR Knee 1-2 Views Right  Result Date: 04/21/2020 Right knee 2 views: No acute findings.  Knee is well located.  Moderate medial joint line narrowing.  Mild patellofemoral changes.  Lateral compartment is well-maintained.    PMFS History: Patient Active Problem List   Diagnosis Date Noted  . Anxiety 02/20/2018  . Fatigue 02/20/2018  . Low blood potassium 02/20/2018  . Allergic rhinitis 12/14/2017  . Fever blister 12/14/2017  . Head congestion 12/14/2017  . Hyperlipidemia 12/14/2017  . Lateral epicondylitis of right elbow 05/04/2017  . Sacral pain 03/23/2017  . High risk medication use 08/03/2016  . Fibromyalgia syndrome 08/03/2016  . Abnormal cardiac CT angiography   . Sacrococcygeal pain 02/20/2016  . Rheumatoid arthritis involving 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HCC) 01/24/2016  . Sacroiliac  joint disease 01/05/2016  . Dyslipidemia 01/30/2015  . 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 Essential hypertension 08/25/2014  . Morbid obesity (HCC) 08/25/2014  . CAD (coronary artery disease), native coronary artery 08/24/2014  . Numbness and tingling in left arm   . Arm numbness left 08/22/2014  . Chest pain 08/22/2014  . Gastroesophageal reflux disease without esophagitis 07/23/2014  . Coronary artery calcification seen on CAT scan 02/19/2014  . Hip pain 10/23/2013  . Solitary pulmonary nodule 08/28/2013  . Chest pain, atypical 07/18/2013  . Heart palpitations 06/27/2013  . Cough 06/04/2013  . Diastolic dysfunction 05/18/2013  . SOB (shortness of breath) 05/09/2013  . Other malaise and fatigue 01/21/2013  . Stress and adjustment reaction 12/09/2012  . Right sided abdominal pain 11/29/2012  . 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/21/2012  . Morbid obesity with BMI of 50.0-59.9, adult (HCC) 11/21/2012  . Arthritis 07/13/2012  . Depression 07/13/2012  . Routine health maintenance 05/07/2012  . Migraine 06/07/1998   Past Medical History:  Diagnosis Date  . Anxiety   . Bursitis   . Coronary artery calcification seen on CAT scan    minimal CAD with 30% prox and mild LAD  . Depression   . Diastolic dysfunction   . Dyslipidemia 01/30/2015  . Esophageal ring   . Fibromyalgia   . Hiatal hernia   . HSV-1 infection   . Morbid obesity (HCC)   . Osteoarthritis   . Rheumatoid arthritis(714.0)    M05.79  . Rheumatoid arthritis, seropositive, 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HCC)    Treated with Orencia, TB neg 09/12/2015  . Spondylolysis     Family History  Problem Relation Age of Onset  . Hyperlipidemia Mother   . Depression Mother   . Sarcoidosis Father   . Lung disease Father        Pleural Mesothelioma  . Cancer Father   . Heart attack Paternal Grandmother   . Hypertension Paternal Grandmother   . Hypertension Maternal  Grandmother   . Diabetes Maternal Grandmother   . Stroke Neg Hx   . Colon cancer Neg Hx   . Esophageal cancer Neg Hx   . Rectal cancer Neg Hx   . Stomach cancer Neg Hx     Past Surgical History:  Procedure Laterality Date  . CARDIAC CATHETERIZATION N/A 06/11/2016   Procedure: Left Heart Cath and Coronary Angiography;  Surgeon: Corky Crafts, MD;  Location: [facility] INVASIVE CV LAB;  Service: Cardiovascular;  Laterality: N/A;  . CESAREAN SECTION    . COLONOSCOPY  07/27/2017  . ESOPHAGEAL MANOMETRY N/A 10/28/2014   Procedure: ESOPHAGEAL MANOMETRY (EM);  Surgeon: Hilarie Fredrickson, MD;  Location: [facility] ENDOSCOPY;  Service: Endoscopy;  Laterality: N/A;  . HERNIA REPAIR  reports surgery on 3 hernias, with 2 more present  . LAPAROSCOPIC GASTRIC SLEEVE RESECTION  11/21/12   [facility]  . LEFT HEART CATHETERIZATION WITH CORONARY ANGIOGRAM N/A 08/26/2014   Procedure: LEFT HEART CATHETERIZATION WITH CORONARY ANGIOGRAM;  Surgeon: Runell Gess, MD;  Location: [facility] CATH LAB;  Service: Cardiovascular;  Laterality: N/A;  . TUBAL LIGATION     Social History   Occupational History  . Occupation: Financial trader SERVICE    Employer: [employer]  Tobacco Use  . Smoking status: Never Smoker  . Smokeless tobacco: Never Used  Vaping Use  . Vaping Use: Never used  Substance and Sexual Activity  . Alcohol use: No  . Drug use: No  . Sexual activity: Yes    Partners: Male    Birth control/protection: None

## 2020-04-22 ENCOUNTER — Other Ambulatory Visit: Payer: Self-pay | Admitting: Physician Assistant

## 2020-04-22 ENCOUNTER — Telehealth: Payer: Self-pay | Admitting: *Deleted

## 2020-04-22 DIAGNOSIS — M0579 Rheumatoid arthritis with rheumatoid factor of multiple sites without organ or systems involvement: Secondary | ICD-10-CM

## 2020-04-22 NOTE — Progress Notes (Signed)
Next infusion scheduled for Orencia on 04/23/20 and due for updated orders.  Last Visit: Virtual visit on 03/07/19 Next Visit: 05/13/20 (she will need to be seen prior to next infusion orders being placed)  Most recent infusion was on 03/26/20  Labs: CBC and CMP were updated on 03/26/20  TB gold negative on 08/20/19 and will continue to be monitored yearly.   Orders placed for Orencia x 3 doses along with premedication of Tylenol and Benadryl.  Standing CBC/CMP orders placed.  Reviewed order set with Dr. Corliss Skains prior to signing.    Sherron Ales, PA-C

## 2020-04-22 NOTE — Telephone Encounter (Signed)
Placed infusion orders for orencia and reviewed with Dr. Corliss Skains.

## 2020-04-22 NOTE — Telephone Encounter (Signed)
Please place infusion orders for Orencia, patient scheduled 04/23/2020.

## 2020-04-23 ENCOUNTER — Ambulatory Visit (HOSPITAL_COMMUNITY)
Admission: RE | Admit: 2020-04-23 | Discharge: 2020-04-23 | Disposition: A | Discharge status: Home / Self Care | Payer: BC Managed Care – PPO | Source: Ambulatory Visit | Attending: Rheumatology | Admitting: Rheumatology

## 2020-04-23 ENCOUNTER — Other Ambulatory Visit: Payer: Self-pay

## 2020-04-23 DIAGNOSIS — M0579 Rheumatoid arthritis with rheumatoid factor of multiple sites without organ or systems involvement: Secondary | ICD-10-CM | POA: Diagnosis not present

## 2020-04-23 MED ORDER — DIPHENHYDRAMINE HCL 25 MG PO CAPS
25.0000 mg | ORAL_CAPSULE | ORAL | Status: DC
  Administered 2020-04-23: 25 mg via ORAL

## 2020-04-23 MED ORDER — DIPHENHYDRAMINE HCL 25 MG PO CAPS
ORAL_CAPSULE | ORAL | Status: AC
  Filled 2020-04-23: qty 1

## 2020-04-23 MED ORDER — ACETAMINOPHEN 325 MG PO TABS
650.0000 mg | ORAL_TABLET | ORAL | Status: DC
  Administered 2020-04-23: 650 mg via ORAL

## 2020-04-23 MED ORDER — SODIUM CHLORIDE 0.9 % IV SOLN
1000.0000 mg | INTRAVENOUS | Status: DC
  Administered 2020-04-23: 1000 mg via INTRAVENOUS
  Filled 2020-04-23: qty 40

## 2020-04-23 MED ORDER — ACETAMINOPHEN 325 MG PO TABS
ORAL_TABLET | ORAL | Status: AC
  Filled 2020-04-23: qty 2

## 2020-05-05 ENCOUNTER — Encounter: Payer: Self-pay | Admitting: Physician Assistant

## 2020-05-05 ENCOUNTER — Ambulatory Visit (INDEPENDENT_AMBULATORY_CARE_PROVIDER_SITE_OTHER): Payer: BC Managed Care – PPO | Admitting: Physician Assistant

## 2020-05-05 ENCOUNTER — Other Ambulatory Visit: Payer: Self-pay | Admitting: Orthopaedic Surgery

## 2020-05-05 DIAGNOSIS — M25561 Pain in right knee: Secondary | ICD-10-CM

## 2020-05-05 MED ORDER — DIAZEPAM 5 MG PO TABS: 5.0000 mg | ORAL_TABLET | Freq: Once | ORAL | 0 refills | Status: AC

## 2020-05-05 MED ORDER — ACETAMINOPHEN-CODEINE #3 300-30 MG PO TABS
1.0000 | ORAL_TABLET | Freq: Three times a day (TID) | ORAL | 0 refills | Status: DC | PRN
Start: 2020-05-05 — End: 2020-06-11

## 2020-05-05 NOTE — Progress Notes (Signed)
HPI: [name] [name] returns today for right knee pain.  She states the cortisone injection 04/21/2020 gave her about a half a day of relief.  She still having giving way ankle notes a new clicking within the knee.  He is using a crutch to ambulate.  She states she is having severe pain within the knee.  Review of systems: See HPI otherwise negative  Physical exam: Right knee tenderness along medial joint line.  Full extension and approximately 90 degrees of flexion right knee. Positive McMurray's.  Impression: Right knee pain  Plan: Given patient's mild patellofemoral changes mild to moderate narrowing the medial joint line along with the sudden onset of the knee pain recommend MRI of the knee to rule out meniscal tear.  We will send in Valium due to the fact that she is claustrophobic.  Also she is given Tylenol 3 for pain.  Questions encouraged and answered at length.  Follow-up after the MRI to go over the results and discuss further treatment.

## 2020-05-06 NOTE — Addendum Note (Signed)
Addended by: Barbette Or on: 05/06/2020 08:24 AM   Modules accepted: Orders

## 2020-05-12 NOTE — Progress Notes (Signed)
Office Visit Note  Patient: [name] [name]             Date of Birth: 04-24-1966           MRN: [id number]             PCP: [name], MD Referring: [name], MD Visit Date: 05/13/2020 Occupation: @GUAROCC @  Subjective:  Pain in multiple joints   History of Present Illness: [name] [name] is a 54 y.o. female with history of seropositive rheumatoid arthritis, osteoarthritis, and fibromyalgia. She is on orencia IV infusions every 28 days, rasuvo 25 mg sq injections once weekly, and FABB daily. Most recent infusion on 04/23/20. She typically experiences increased joint pain and stiffness about 5 days before her infusions.  She experiences intermittent pain and stiffness in both elbows, both hands, and both ankle joints.  She has been experiencing pain and instability in the right knee joint.  She has been seeing Richardean Canal, PA-C at [facility].  She had a cortisone injection performed on 04/21/20, which provided relief for 1 day.  She is scheduled for a MRI on 05/23/20.  She is currently wearing a brace for support.  She is currently experiencing myalgias and muscle tenderness due to fibromyalgia.  She reports she feels like she is about to have a flare. She has been going to integrative therapies 3 times a week, which has been helpful.  She continues to have significant fatigue secondary to insomnia.  She has been experiencing nocturnal pain on a nightly basis.  She denies any recent infections.  She has received both covid-19 vaccine doses and is planning on receiving the booster dose soon.    Activities of Daily Living:  Patient reports morning stiffness for several  hours.   Patient Reports nocturnal pain.  Difficulty dressing/grooming: Reports Difficulty climbing stairs: Reports Difficulty getting out of chair: Reports Difficulty using hands for taps, buttons, cutlery, and/or writing: Reports  Review of Systems  Constitutional: Positive for fatigue.  HENT:  Positive for mouth sores, mouth dryness and nose dryness.   Eyes: Positive for visual disturbance. Negative for pain and dryness.  Respiratory: Negative for cough, hemoptysis, shortness of breath and difficulty breathing.   Cardiovascular: Negative for chest pain, palpitations, hypertension and swelling in legs/feet.  Gastrointestinal: Positive for constipation. Negative for blood in stool and diarrhea.  Endocrine: Positive for excessive thirst. Negative for increased urination.  Genitourinary: Negative for painful urination.  Musculoskeletal: Positive for arthralgias, joint pain, joint swelling, myalgias, muscle weakness, morning stiffness, muscle tenderness and myalgias.  Skin: Positive for color change. Negative for pallor, rash, hair loss, nodules/bumps, skin tightness, ulcers and sensitivity to sunlight.  Allergic/Immunologic: Negative for susceptible to infections.  Neurological: Positive for dizziness, numbness, headaches and weakness.  Hematological: Negative for swollen glands.  Psychiatric/Behavioral: Positive for depressed mood and sleep disturbance. The patient is nervous/anxious.     PMFS History:  Patient Active Problem List   Diagnosis Date Noted  . Anxiety 02/20/2018  . Fatigue 02/20/2018  . Low blood potassium 02/20/2018  . Allergic rhinitis 12/14/2017  . Fever blister 12/14/2017  . Head congestion 12/14/2017  . Hyperlipidemia 12/14/2017  . Lateral epicondylitis of right elbow 05/04/2017  . Sacral pain 03/23/2017  . High risk medication use 08/03/2016  . Fibromyalgia syndrome 08/03/2016  . Abnormal cardiac CT angiography   . Sacrococcygeal pain 02/20/2016  . Rheumatoid arthritis involving 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HCC) 01/24/2016  . 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 Dyslipidemia 01/30/2015  .  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 Essential hypertension 08/25/2014  . Morbid obesity (HCC) 08/25/2014  . CAD (coronary  artery disease), native coronary artery 08/24/2014  . Numbness and tingling in left arm   . Arm numbness left 08/22/2014  . Chest pain 08/22/2014  . Gastroesophageal reflux disease without esophagitis 07/23/2014  . Coronary artery calcification seen on CAT scan 02/19/2014  . Hip pain 10/23/2013  . Solitary pulmonary nodule 08/28/2013  . Chest pain, atypical 07/18/2013  . Heart palpitations 06/27/2013  . Cough 06/04/2013  . Diastolic dysfunction 05/18/2013  . SOB (shortness of breath) 05/09/2013  . Other malaise and fatigue 01/21/2013  . Stress and adjustment reaction 12/09/2012  . Right sided abdominal pain 11/29/2012  . 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/21/2012  . Morbid obesity with BMI of 50.0-59.9, adult (HCC) 11/21/2012  . Arthritis 07/13/2012  . Depression 07/13/2012  . Routine health maintenance 05/07/2012  . Migraine 06/07/1998    Past Medical History:  Diagnosis Date  . Anxiety   . Bursitis   . Coronary artery calcification seen on CAT scan    minimal CAD with 30% prox and mild LAD  . Depression   . Diastolic dysfunction   . Dyslipidemia 01/30/2015  . Esophageal ring   . Fibromyalgia   . Hiatal hernia   . HSV-1 infection   . Morbid obesity (HCC)   . Osteoarthritis   . Rheumatoid arthritis(714.0)    M05.79  . Rheumatoid arthritis, seropositive, 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HCC)    Treated with Orencia, TB neg 09/12/2015  . Spondylolysis     Family History  Problem Relation Age of Onset  . Hyperlipidemia Mother   . Depression Mother   . Sarcoidosis Father   . Lung disease Father        Pleural Mesothelioma  . Cancer Father   . Heart attack Paternal Grandmother   . Hypertension Paternal Grandmother   . Hypertension Maternal Grandmother   . Diabetes Maternal Grandmother   . Stroke Neg Hx   . Colon cancer Neg Hx   . Esophageal cancer Neg Hx   . Rectal cancer Neg Hx   . Stomach cancer Neg Hx    Past Surgical History:  Procedure Laterality Date  .  CARDIAC CATHETERIZATION N/A 06/11/2016   Procedure: Left Heart Cath and Coronary Angiography;  Surgeon: Corky Crafts, MD;  Location: [facility] INVASIVE CV LAB;  Service: Cardiovascular;  Laterality: N/A;  . CESAREAN SECTION    . COLONOSCOPY  07/27/2017  . ESOPHAGEAL MANOMETRY N/A 10/28/2014   Procedure: ESOPHAGEAL MANOMETRY (EM);  Surgeon: Hilarie Fredrickson, MD;  Location: [facility] ENDOSCOPY;  Service: Endoscopy;  Laterality: N/A;  . HERNIA REPAIR     reports surgery on 3 hernias, with 2 more present  . LAPAROSCOPIC GASTRIC SLEEVE RESECTION  11/21/12   [facility]  . LEFT HEART CATHETERIZATION WITH CORONARY ANGIOGRAM N/A 08/26/2014   Procedure: LEFT HEART CATHETERIZATION WITH CORONARY ANGIOGRAM;  Surgeon: Runell Gess, MD;  Location: [facility] CATH LAB;  Service: Cardiovascular;  Laterality: N/A;  . TUBAL LIGATION     Social History   Social History Narrative   Marital Status: Married Probation officer)    Children:  Son [name]) Daughter [name])    Pets: None    Living Situation: Lives with husband and children    Occupation: Occupational psychologist Administrator)     Education: Oncologist (Psychology)     Tobacco Use/Exposure:  None    Alcohol Use:  Occasional   Drug Use:  None   Diet:  Regular   Exercise: Walking or Treadmill (2 x week)   Hobbies: Clinical cytogeneticist, [employer]ations.               Immunization History  Administered Date(s) Administered  . PFIZER SARS-COV-2 Vaccination 08/30/2019, 09/20/2019  . Tdap 09/21/2016     Objective: Vital Signs: BP 123/86 (BP Location: Left Arm, Patient Position: Sitting, Cuff Size: Large)   Pulse 86   Resp 17   Ht 5\' 2"  (1.575 m)   Wt 267 lb 12.8 oz (121.5 kg)   BMI 48.98 kg/m    Physical Exam Vitals and nursing note reviewed.  Constitutional:      Appearance: She is well-developed.  HENT:     Head: Normocephalic and atraumatic.  Eyes:     Conjunctiva/sclera: Conjunctivae normal.  Pulmonary:     Effort: Pulmonary effort is  normal.  Abdominal:     Palpations: Abdomen is soft.  Musculoskeletal:     Cervical back: Normal range of motion.  Skin:    General: Skin is warm and dry.     Capillary Refill: Capillary refill takes less than 2 seconds.  Neurological:     Mental Status: She is alert and oriented to person, place, and time.  Psychiatric:        Behavior: Behavior normal.      Musculoskeletal Exam: Generalized hyperalgesia and positive tender points. C-spine limited and painful ROM.  Trapezius muscle tension and tenderness.  Shoulder joint abduction to about 120 degrees bilaterally  Tenderness over both elbow joints.  Wrist joints, MCPs, PIPs, and DIPs good ROM with no synovitis.  Tenderness over the right 4th MCP and left 3rd DIP joint.  Complete fist formation bilaterally.  PIP and DIP thickening consistent with osteoarthritis of both hands. Hip joints difficult to assess in seated position.  Tenderness over both trochanteric bursa. Tenderness over both ankle joints. Pedal edema noted bilaterally.   CDAI Exam: CDAI Score: 5  Patient Global: 6 mm; Provider Global: 4 mm Swollen: 0 ; Tender: 7  Joint Exam 05/13/2020      Right  Left  Elbow   Tender   Tender  MCP 4   Tender     DIP 4      Tender  Knee   Tender     Ankle   Tender   Tender     Investigation: No additional findings.  Imaging: XR Knee 1-2 Views Right  Result Date: 04/21/2020 Right knee 2 views: No acute findings.  Knee is well located.  Moderate medial joint line narrowing.  Mild patellofemoral changes.  Lateral compartment is well-maintained.   Recent Labs: Lab Results  Component Value Date   WBC 8.4 03/26/2020   HGB 13.0 03/26/2020   PLT 258 03/26/2020   NA 140 03/26/2020   K 4.0 03/26/2020   CL 105 03/26/2020   CO2 26 03/26/2020   GLUCOSE 90 03/26/2020   BUN 14 03/26/2020   CREATININE 0.88 03/26/2020   BILITOT 0.6 03/26/2020   ALKPHOS 72 03/26/2020   AST 23 03/26/2020   ALT 15 03/26/2020   PROT 6.4 (L)  03/26/2020   ALBUMIN 3.3 (L) 03/26/2020   CALCIUM 9.2 03/26/2020   GFRAA >60 12/26/2019   QFTBGOLD Negative 09/01/2016   QFTBGOLDPLUS Negative 08/20/2019    Speciality Comments: ORENCIA 1000 mg x 4 weeks  Procedures:  No procedures performed Allergies: Influenza vaccines, Amitriptyline, and Isosorbide   Assessment / Plan:  Visit Diagnoses: Rheumatoid arthritis involving multiple sites with positive rheumatoid factor (HCC): She has no synovitis on exam.  She has generalized hyperalgesia and positive tender points due to underlying fibromyalgia.  She has tenderness over both elbow joints, right 4th MCP joint, left 3rd DIP, and both ankle joints but no inflammation was noted.  She has persistent morning stiffness lasting several hours and ongoing nocturnal pain.  Overall she is clinically stable on Orencia 1000 mg IV infusions every 28 days, rasuvo 25 mg sq injections once weekly, and FABB as prescribed.  She typically experiences increased arthralgias and joint stiffness about 5 days prior to her infusions.  Most recent infusion was scheduled on 04/23/20.  She will continue on the current treatment regimen.  Refills of rasuvo, fabb, and voltaren gel will be sent to the pharmacy. She was advised to notify us if she develops increased joint pain or joint swelling.  She will follow-up in the office in 5 months.  High risk medication use: Orencia 1,000 mg IV infusions every 28 days (most recent infusion on 04/23/20), Rasuvo 25 mg sq injections once weekly, and Fabb.   CBC and CMP 03/26/20. She will be due to update CBC and CMP in January and every 3 months.TB gold negative on 08/20/19 and will continue to be monitored yearly. Future order for TB gold remains in place.  She has not had any recent infections.  She has received both COVID-19 vaccinations and plans on receiving the booster dosing. We discussed the importance of avoiding Rasuvo and postponing Orencia infusions if she develops signs or  symptoms of an infection and to resume once infection has completely cleared.  She voiced understanding.   Primary osteoarthritis of both knees: She is scheduled for a MRI of the right knee joint on 05/23/20.    Fibromyalgia: She has generalized hyperalgesia and positive tender points on exam.  She is starting to experience symptoms of a fibromyalgia flare.  She has generalized myalgias and muscle tenderness.  She has been experiencing increased fatigue secondary to insomnia.  She has been experiencing nocturnal pain on a nightly basis.  She has been going integrative therapies 3 times a week which has been helpful at alleviating some her discomfort.  We discussed going back to water therapy/aerobics but she is apprehensive during the pandemic.  She will continue taking gabapentin and Flexeril as prescribed for symptomatic relief. She requested a refill of flexeril and lidoderm patches. We discussed the importance of regular exercise and good sleep hygiene.  Other fatigue: Chronic and secondary to insomnia. Discussed the importance of regular exercise.   Other insomnia: Discussed the importance of good sleep hygiene.   Trochanteric bursitis of both hips: She has tenderness palpation of her bilateral trochanteric bursa.  She has  been experiencing increased discomfort on the right side which she attributes to gait changes secondary to increased right knee joint pain.  Other medical conditions are listed as follows:   History of coronary artery disease  Dyslipidemia  History of depression  History of vitamin D deficiency  History of hypertension  Solitary pulmonary nodule  History of diastolic dysfunction  S/P laparoscopic sleeve gastrectomy  Orders: No orders of the defined types were placed in this encounter.  Meds ordered this encounter  Medications  . diclofenac Sodium (VOLTAREN) 1 % GEL    Sig: Apply 2 grams to 4 grams to the affected area up to 4 times daily as needed.     Dispense:  400 g  Refill:  4  . lidocaine (LIDODERM) 5 %    Sig: Place 1 patch onto the skin daily. Remove & Discard patch within 12 hours or as directed by MD    Dispense:  30 patch    Refill:  0     Follow-Up Instructions: Return in about 5 months (around 10/11/2020) for Rheumatoid arthritis, Osteoarthritis, Fibromyalgia.   Gearldine Bienenstock, PA-C  Note - This record has been created using Dragon software.  Chart creation errors have been sought, but may not always  have been located. Such creation errors do not reflect on  the standard of medical care.

## 2020-05-13 ENCOUNTER — Ambulatory Visit (INDEPENDENT_AMBULATORY_CARE_PROVIDER_SITE_OTHER): Payer: BC Managed Care – PPO | Admitting: Physician Assistant

## 2020-05-13 ENCOUNTER — Other Ambulatory Visit: Payer: Self-pay

## 2020-05-13 ENCOUNTER — Other Ambulatory Visit: Payer: Self-pay | Admitting: *Deleted

## 2020-05-13 ENCOUNTER — Encounter: Payer: Self-pay | Admitting: Physician Assistant

## 2020-05-13 VITALS — BP 123/86 | HR 86 | Resp 17 | Ht 62.0 in | Wt 267.8 lb

## 2020-05-13 DIAGNOSIS — M797 Fibromyalgia: Secondary | ICD-10-CM

## 2020-05-13 DIAGNOSIS — M170 Bilateral primary osteoarthritis of knee: Secondary | ICD-10-CM | POA: Diagnosis not present

## 2020-05-13 DIAGNOSIS — E785 Hyperlipidemia, unspecified: Secondary | ICD-10-CM

## 2020-05-13 DIAGNOSIS — Z8679 Personal history of other diseases of the circulatory system: Secondary | ICD-10-CM

## 2020-05-13 DIAGNOSIS — M0579 Rheumatoid arthritis with rheumatoid factor of multiple sites without organ or systems involvement: Secondary | ICD-10-CM

## 2020-05-13 DIAGNOSIS — Z79899 Other long term (current) drug therapy: Secondary | ICD-10-CM

## 2020-05-13 DIAGNOSIS — M7062 Trochanteric bursitis, left hip: Secondary | ICD-10-CM

## 2020-05-13 DIAGNOSIS — Z9884 Bariatric surgery status: Secondary | ICD-10-CM

## 2020-05-13 DIAGNOSIS — R5383 Other fatigue: Secondary | ICD-10-CM

## 2020-05-13 DIAGNOSIS — Z8639 Personal history of other endocrine, nutritional and metabolic disease: Secondary | ICD-10-CM

## 2020-05-13 DIAGNOSIS — G4709 Other insomnia: Secondary | ICD-10-CM

## 2020-05-13 DIAGNOSIS — Z8659 Personal history of other mental and behavioral disorders: Secondary | ICD-10-CM

## 2020-05-13 DIAGNOSIS — M7061 Trochanteric bursitis, right hip: Secondary | ICD-10-CM

## 2020-05-13 DIAGNOSIS — R911 Solitary pulmonary nodule: Secondary | ICD-10-CM

## 2020-05-13 MED ORDER — DICLOFENAC SODIUM 1 % EX GEL
CUTANEOUS | 4 refills | Status: DC
Start: 2020-05-13 — End: 2020-09-04

## 2020-05-13 MED ORDER — RASUVO 25 MG/0.5ML ~~LOC~~ SOAJ
SUBCUTANEOUS | 0 refills | Status: DC
Start: 2020-05-13 — End: 2021-08-12

## 2020-05-13 MED ORDER — GABAPENTIN 300 MG PO CAPS
ORAL_CAPSULE | ORAL | 0 refills | Status: DC
Start: 2020-05-13 — End: 2020-08-18

## 2020-05-13 MED ORDER — CYCLOBENZAPRINE HCL 10 MG PO TABS
ORAL_TABLET | ORAL | 0 refills | Status: DC
Start: 2020-05-13 — End: 2020-08-18

## 2020-05-13 MED ORDER — FABB 2.2-25-1 MG PO TABS: 2.0000 | ORAL_TABLET | Freq: Every day | ORAL | 0 refills | Status: DC

## 2020-05-13 MED ORDER — LIDOCAINE 5 % EX PTCH
1.0000 | MEDICATED_PATCH | CUTANEOUS | 0 refills | Status: DC
Start: 2020-05-13 — End: 2020-07-01

## 2020-05-13 NOTE — Progress Notes (Signed)
I have sent refills for flexeril, FABB, and gabapentin.   Please pend refill for rasuvo.

## 2020-05-13 NOTE — Patient Instructions (Signed)
  COVID-19 vaccine recommendations:   COVID-19 vaccine is recommended for everyone (unless you are allergic to a vaccine component), even if you are on a medication that suppresses your immune system.   If you are on Methotrexate, Cellcept (mycophenolate), Rinvoq, Xeljanz, and Olumiant- hold the medication for 1 week after each vaccine. Hold Methotrexate for 2 weeks after the single dose COVID-19 vaccine.   If you are on Orencia subcutaneous injection - hold medication one week prior to and one week after the first COVID-19 vaccine dose (only).   If you are on Orencia IV infusions- time vaccination administration so that the first COVID-19 vaccination will occur four weeks after the infusion and postpone the subsequent infusion by one week.   If you are on Cyclophosphamide or Rituxan infusions please contact your doctor prior to receiving the COVID-19 vaccine.   Do not take Tylenol or any anti-inflammatory medications (NSAIDs) 24 hours prior to the COVID-19 vaccination.   There is no direct evidence about the efficacy of the COVID-19 vaccine in individuals who are on medications that suppress the immune system.   Even if you are fully vaccinated, and you are on any medications that suppress your immune system, please continue to wear a mask, maintain at least six feet social distance and practice hand hygiene.   If you develop a COVID-19 infection, please contact your PCP or our office to determine if you need monoclonal antibody infusion.  The booster vaccine is now available for immunocompromised patients.   Please see the following web sites for updated information.   [email or web address]    

## 2020-05-21 ENCOUNTER — Other Ambulatory Visit: Payer: Self-pay

## 2020-05-21 ENCOUNTER — Ambulatory Visit (HOSPITAL_COMMUNITY)
Admission: RE | Admit: 2020-05-21 | Discharge: 2020-05-21 | Disposition: A | Discharge status: Home / Self Care | Payer: BC Managed Care – PPO | Source: Ambulatory Visit | Attending: Rheumatology | Admitting: Rheumatology

## 2020-05-21 ENCOUNTER — Ambulatory Visit (INDEPENDENT_AMBULATORY_CARE_PROVIDER_SITE_OTHER): Payer: BC Managed Care – PPO | Admitting: Surgery

## 2020-05-21 DIAGNOSIS — M0579 Rheumatoid arthritis with rheumatoid factor of multiple sites without organ or systems involvement: Secondary | ICD-10-CM | POA: Insufficient documentation

## 2020-05-21 DIAGNOSIS — G8929 Other chronic pain: Secondary | ICD-10-CM

## 2020-05-21 DIAGNOSIS — M533 Sacrococcygeal disorders, not elsewhere classified: Secondary | ICD-10-CM | POA: Diagnosis not present

## 2020-05-21 LAB — CBC WITH DIFFERENTIAL/PLATELET
Abs Immature Granulocytes: 0.02 10*3/uL (ref 0.00–0.07)
Basophils Absolute: 0.1 10*3/uL (ref 0.0–0.1)
Basophils Relative: 1 %
Eosinophils Absolute: 0.2 10*3/uL (ref 0.0–0.5)
Eosinophils Relative: 2 %
HCT: 47 % — ABNORMAL HIGH (ref 36.0–46.0)
Hemoglobin: 14.2 g/dL (ref 12.0–15.0)
Immature Granulocytes: 0 %
Lymphocytes Relative: 51 %
Lymphs Abs: 3.5 10*3/uL (ref 0.7–4.0)
MCH: 25.8 pg — ABNORMAL LOW (ref 26.0–34.0)
MCHC: 30.2 g/dL (ref 30.0–36.0)
MCV: 85.5 fL (ref 80.0–100.0)
Monocytes Absolute: 0.4 10*3/uL (ref 0.1–1.0)
Monocytes Relative: 6 %
Neutro Abs: 2.8 10*3/uL (ref 1.7–7.7)
Neutrophils Relative %: 40 %
Platelets: 276 10*3/uL (ref 150–400)
RBC: 5.5 MIL/uL — ABNORMAL HIGH (ref 3.87–5.11)
RDW: 12.8 % (ref 11.5–15.5)
WBC: 7 10*3/uL (ref 4.0–10.5)
nRBC: 0 % (ref 0.0–0.2)

## 2020-05-21 LAB — COMPREHENSIVE METABOLIC PANEL
ALT: 20 U/L (ref 0–44)
AST: 27 U/L (ref 15–41)
Albumin: 3.4 g/dL — ABNORMAL LOW (ref 3.5–5.0)
Alkaline Phosphatase: 77 U/L (ref 38–126)
Anion gap: 9 (ref 5–15)
BUN: 9 mg/dL (ref 6–20)
CO2: 27 mmol/L (ref 22–32)
Calcium: 8.9 mg/dL (ref 8.9–10.3)
Chloride: 105 mmol/L (ref 98–111)
Creatinine, Ser: 0.87 mg/dL (ref 0.44–1.00)
GFR, Estimated: >60 mL/min (ref >60)
Glucose, Bld: 88 mg/dL (ref 70–99)
Potassium: 3.9 mmol/L (ref 3.5–5.1)
Sodium: 141 mmol/L (ref 135–145)
Total Bilirubin: 0.6 mg/dL (ref 0.3–1.2)
Total Protein: 7 g/dL (ref 6.5–8.1)

## 2020-05-21 MED ORDER — SODIUM CHLORIDE 0.9 % IV SOLN
1000.0000 mg | INTRAVENOUS | Status: DC
  Administered 2020-05-21: 12:00:00 1000 mg via INTRAVENOUS
  Filled 2020-05-21: qty 40

## 2020-05-21 MED ORDER — ACETAMINOPHEN 325 MG PO TABS: 650.0000 mg | ORAL_TABLET | ORAL | Status: DC

## 2020-05-21 MED ORDER — DIPHENHYDRAMINE HCL 25 MG PO CAPS
ORAL_CAPSULE | ORAL | Status: AC
  Administered 2020-05-21: 12:00:00 25 mg via ORAL
  Filled 2020-05-21: qty 1

## 2020-05-21 MED ORDER — DIPHENHYDRAMINE HCL 25 MG PO CAPS: 25.0000 mg | ORAL_CAPSULE | ORAL | Status: DC

## 2020-05-21 MED ORDER — ACETAMINOPHEN 325 MG PO TABS
ORAL_TABLET | ORAL | Status: AC
  Administered 2020-05-21: 12:00:00 650 mg via ORAL
  Filled 2020-05-21: qty 2

## 2020-05-21 NOTE — Progress Notes (Signed)
Office Visit Note   Patient: [name] [name]           Date of Birth: 12/05/1965           MRN: [id number] Visit Date: 05/21/2020              Requested by: [name], MD [address] [place] [place],  [place] [address] PCP: [name], MD   Assessment & Plan: Visit Diagnoses:  1. Chronic SI joint pain     Plan:  I still think it would come down the patient needing definitive treatment with bilateral SI joint fusions.  I did speak with Dr. Alvester Morin physiatrist in our clinic today and we will have patient come in for bilateral SI joint diagnostic/therapeutic injections and then will proceed with radiofrequency ablation depending on her response.  We will then see about referring her to Dr. Yevette Edwards to again discussed the benefits of surgery if needed.  All questions answered.    Follow-Up Instructions: Return in about 2 months (around 07/22/2020), or if symptoms worsen or fail to improve.   Orders:  Orders Placed This Encounter  Procedures  . Ambulatory referral to Physical Medicine Rehab   No orders of the defined types were placed in this encounter.     Procedures: No procedures performed   Clinical Data: No additional findings.   Subjective: No chief complaint on file.   HPI 54 year old patient with history of rheumatoid arthritis returns with complaints of bilateral SI joint pain.  She has known history of severe SI joint DJD.  I had last seen patient in 2019 for this problem and I had referred her to Dr. Estill Bamberg here in town to discuss possible scheduling of SI joint fusions.  Patient states that Dr. Yevette Edwards was willing to proceed with scheduling the surgery but her insurance denied because she had not had radiofrequency ablation as a conservative management.  Patient has had SI joint injections in the past.  She continues to localize severe pain to the bilateral SI joints and this is aggravated with all activity.  Patient did have lumbar MRI  scan performed August 28, 2017 and that scan did document intact visible sacrum.  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  Physical Exam HENT:     Head: Normocephalic and atraumatic.  Pulmonary:     Effort: No respiratory distress.  Musculoskeletal:     Comments: Marked tenderness over the bilateral SI joints.  Neurological:     Mental Status: She is oriented to person, place, and time.  Psychiatric:        Mood and Affect: Mood normal.     Ortho Exam  Specialty Comments:  No specialty comments available.  Imaging: No results found.   PMFS History: Patient Active Problem List   Diagnosis Date Noted  . Anxiety 02/20/2018  . Fatigue 02/20/2018  . Low blood potassium 02/20/2018  . Allergic rhinitis 12/14/2017  . Fever blister 12/14/2017  . Head congestion 12/14/2017  . Hyperlipidemia 12/14/2017  . Lateral epicondylitis of right elbow 05/04/2017  . Sacral pain 03/23/2017  . High risk medication use 08/03/2016  . Fibromyalgia syndrome 08/03/2016  . Abnormal cardiac CT angiography   . Sacrococcygeal pain 02/20/2016  . Rheumatoid arthritis involving 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HCC) 01/24/2016  . Sacroiliac joint  disease 01/05/2016  . Dyslipidemia 01/30/2015  . 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 Essential hypertension 08/25/2014  . Morbid obesity (HCC) 08/25/2014  . CAD (coronary artery disease), native coronary artery 08/24/2014  . Numbness and tingling in left arm   . Arm numbness left 08/22/2014  . Chest pain 08/22/2014  . Gastroesophageal reflux disease without esophagitis 07/23/2014  . Coronary artery calcification seen on CAT scan 02/19/2014  . Hip pain 10/23/2013  . Solitary pulmonary nodule 08/28/2013  . Chest pain, atypical 07/18/2013  .  Heart palpitations 06/27/2013  . Cough 06/04/2013  . Diastolic dysfunction 05/18/2013  . SOB (shortness of breath) 05/09/2013  . Other malaise and fatigue 01/21/2013  . Stress and adjustment reaction 12/09/2012  . Right sided abdominal pain 11/29/2012  . 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/21/2012  . Morbid obesity with BMI of 50.0-59.9, adult (HCC) 11/21/2012  . Arthritis 07/13/2012  . Depression 07/13/2012  . Routine health maintenance 05/07/2012  . Migraine 06/07/1998   Past Medical History:  Diagnosis Date  . Anxiety   . Bursitis   . Coronary artery calcification seen on CAT scan    minimal CAD with 30% prox and mild LAD  . Depression   . Diastolic dysfunction   . Dyslipidemia 01/30/2015  . Esophageal ring   . Fibromyalgia   . Hiatal hernia   . HSV-1 infection   . Morbid obesity (HCC)   . Osteoarthritis   . Rheumatoid arthritis(714.0)    M05.79  . Rheumatoid arthritis, seropositive, 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HCC)    Treated with Orencia, TB neg 09/12/2015  . Spondylolysis     Family History  Problem Relation Age of Onset  . Hyperlipidemia Mother   . Depression Mother   . Sarcoidosis Father   . Lung disease Father        Pleural Mesothelioma  . Cancer Father   . Heart attack Paternal Grandmother   . Hypertension Paternal Grandmother   . Hypertension Maternal Grandmother   . Diabetes Maternal Grandmother   . Stroke Neg Hx   . Colon cancer Neg Hx   . Esophageal cancer Neg Hx   . Rectal cancer Neg Hx   . Stomach cancer Neg Hx     Past Surgical History:  Procedure Laterality Date  . CARDIAC CATHETERIZATION N/A 06/11/2016   Procedure: Left Heart Cath and Coronary Angiography;  Surgeon: Corky Crafts, MD;  Location: [facility] INVASIVE CV LAB;  Service: Cardiovascular;  Laterality: N/A;  . CESAREAN SECTION    . COLONOSCOPY  07/27/2017  . ESOPHAGEAL MANOMETRY N/A 10/28/2014   Procedure: ESOPHAGEAL MANOMETRY (EM);  Surgeon: Hilarie Fredrickson, MD;  Location: [facility] ENDOSCOPY;   Service: Endoscopy;  Laterality: N/A;  . HERNIA REPAIR     reports surgery on 3 hernias, with 2 more present  . LAPAROSCOPIC GASTRIC SLEEVE RESECTION  11/21/12   [facility]  . LEFT HEART CATHETERIZATION WITH CORONARY ANGIOGRAM N/A 08/26/2014   Procedure: LEFT HEART CATHETERIZATION WITH CORONARY ANGIOGRAM;  Surgeon: Runell Gess, MD;  Location: [facility] CATH LAB;  Service: Cardiovascular;  Laterality: N/A;  . TUBAL LIGATION     Social History   Occupational History  . Occupation: Financial trader SERVICE    Employer: [employer]  Tobacco Use  . Smoking status: Never Smoker  . Smokeless tobacco: Never Used  Vaping Use  . Vaping Use: Never used  Substance and Sexual Activity  . Alcohol use: No  . Drug use: No  . Sexual  activity: Yes    Partners: Male    Birth control/protection: None

## 2020-05-21 NOTE — Progress Notes (Signed)
CBC and CMP stable.

## 2020-05-22 ENCOUNTER — Ambulatory Visit: Payer: BC Managed Care – PPO | Admitting: Specialist

## 2020-05-23 ENCOUNTER — Ambulatory Visit
Admission: RE | Admit: 2020-05-23 | Discharge: 2020-05-23 | Disposition: A | Discharge status: Home / Self Care | Payer: BC Managed Care – PPO | Source: Ambulatory Visit | Attending: Physician Assistant | Admitting: Physician Assistant

## 2020-05-23 ENCOUNTER — Other Ambulatory Visit: Payer: Self-pay

## 2020-05-23 DIAGNOSIS — M25561 Pain in right knee: Secondary | ICD-10-CM

## 2020-05-26 ENCOUNTER — Telehealth: Payer: Self-pay | Admitting: Physician Assistant

## 2020-05-26 NOTE — Telephone Encounter (Signed)
Pt called stating she had an MRI on  05/23/20 and she is very concerned about what the results showed; she would like a CB as soon as possible to discuss further and even possibly to be worked in.   [phone]

## 2020-05-26 NOTE — Telephone Encounter (Signed)
She ha s mild arthritis of her knee no meniscal tears

## 2020-06-02 ENCOUNTER — Telehealth: Payer: Self-pay | Admitting: Pharmacy Technician

## 2020-06-02 NOTE — Telephone Encounter (Signed)
Submitted new Orencia IV precertification for 2022. Submitted patient h&p, now awaiting response.  Submitted Berkley Harvey was for [id number], was advised no Berkley Harvey is needed for 12162 (1st hour of infusion).  Submitted for 14 units, with coverage 06/07/20 to 06/06/21  Case# [id number]  Phone# [phone] Fax# [phone]  Estimated 1-15 day turnaround time

## 2020-06-08 ENCOUNTER — Other Ambulatory Visit: Payer: Self-pay | Admitting: Rheumatology

## 2020-06-09 ENCOUNTER — Encounter: Payer: Self-pay | Admitting: Physician Assistant

## 2020-06-09 ENCOUNTER — Other Ambulatory Visit: Payer: Self-pay | Admitting: *Deleted

## 2020-06-09 ENCOUNTER — Telehealth: Payer: Self-pay

## 2020-06-09 ENCOUNTER — Ambulatory Visit: Payer: BC Managed Care – PPO | Admitting: Physician Assistant

## 2020-06-09 DIAGNOSIS — N912 Amenorrhea, unspecified: Secondary | ICD-10-CM | POA: Insufficient documentation

## 2020-06-09 DIAGNOSIS — M25561 Pain in right knee: Secondary | ICD-10-CM

## 2020-06-09 MED ORDER — DILTIAZEM HCL ER COATED BEADS 180 MG PO CP24
180.0000 mg | ORAL_CAPSULE | Freq: Every day | ORAL | 0 refills | Status: DC
Start: 2020-06-09 — End: 2020-12-24

## 2020-06-09 NOTE — Progress Notes (Signed)
HPI: Ms. [name] [name] returns today due to her right knee pain.  Again she underwent an MRI of the right knee which showed no meniscal tear showed some mild medial and patellofemoral degenerative changes.  She states she is still having some clicking and pain in the knee and points to the patellar region.  We did an injection on 04/21/2020 and she is also wearing the open patella knee brace but continues to have the pain in her knee.  Impression: Right knee mild patella and medial compartmental arthritic changes  Plan: Discussed with her that at this point time would recommend formal physical therapy which we have given her prescription for also a supplemental injection.  No charge for today's office visit.  She will follow-up once the supplemental injection is approved.  Questions were encouraged and answered

## 2020-06-09 NOTE — Telephone Encounter (Signed)
Submitted for VOB for Durolane-Right knee 

## 2020-06-09 NOTE — Telephone Encounter (Signed)
Last Visit: 05/13/2020 Next Visit: 10/09/2020  Current Dose per office note 05/13/2020: not discussed DX: Rheumatoid arthritis involving multiple sites with positive rheumatoid factor   Patient was given a prescription for FABB tab on 05/13/2020.   Okay to refill Folic Acid?

## 2020-06-09 NOTE — Telephone Encounter (Signed)
FABB contains folic acid. She does not need to be taking both.

## 2020-06-10 ENCOUNTER — Telehealth: Payer: Self-pay

## 2020-06-10 NOTE — Telephone Encounter (Signed)
Received notification from [facility] regarding a prior authorization for Orencia [id number]. Authorization has been APPROVED from 06/07/20 to 06/06/21.   Approved for 365 days  Authorization # [id number] Phone # [phone]

## 2020-06-10 NOTE — Telephone Encounter (Signed)
Per VOB pt's insurance doesn't cover gel injections.Marland KitchenMarland KitchenWill call her to discuss paying for Trivisc injections

## 2020-06-10 NOTE — Telephone Encounter (Signed)
I called and talked to the pt. She stated her rheumatology doctor ordered it last time and it was covered. She wants to contact them first.

## 2020-06-11 ENCOUNTER — Other Ambulatory Visit: Payer: Self-pay | Admitting: Orthopaedic Surgery

## 2020-06-12 ENCOUNTER — Encounter: Payer: Self-pay | Admitting: Rheumatology

## 2020-06-15 ENCOUNTER — Telehealth: Payer: Self-pay | Admitting: Rheumatology

## 2020-06-16 ENCOUNTER — Telehealth: Payer: Self-pay | Admitting: Rheumatology

## 2020-06-16 NOTE — Telephone Encounter (Signed)
Please advise patient to reach her PCPs office.  If the symptoms get worse then she should go to the emergency room.

## 2020-06-16 NOTE — Telephone Encounter (Signed)
I received a message from answering service on June 14, 2020 at 11:04 AM.  I returned phone call and spoke with Shomari.  Patient described that she was having fever 101 F, chills, sore throat and diarrhea.  She stated that she was tested positive for COVID-19.  She wanted [place] to set up an infusion for her.  I advised her that there is shortage of monoclonal antibodies and she may or may not get the infusion.  She has been on Orencia IV and methotrexate.  She was advised to hold methotrexate and Orencia.  She was advised that she can restart Orencia a month after being asymptomatic and methotrexate 3 weeks after being asymptomatic.  I filled the infusion form and received confirmation from the infusion center.  Patient was also advised to go to the emergency room if her symptoms get worse. Pollyann Savoy, MD

## 2020-06-18 ENCOUNTER — Inpatient Hospital Stay (HOSPITAL_COMMUNITY): Admission: RE | Admit: 2020-06-18 | Payer: BC Managed Care – PPO | Source: Ambulatory Visit

## 2020-06-18 NOTE — Telephone Encounter (Signed)
Opened in error

## 2020-07-01 ENCOUNTER — Other Ambulatory Visit: Payer: Self-pay | Admitting: Physician Assistant

## 2020-07-01 NOTE — Telephone Encounter (Signed)
Last Visit: 05/13/2020 Next Visit: 10/09/2020  Current Dose per office note on 05/13/2020,   lidocaine (LIDODERM) 5 %    Sig: Place 1 patch onto the skin daily. Remove & Discard patch within 12 hours or as directed by MD    Dispense:  30 patch    Refill:  0    Dx:  Rheumatoid arthritis involving multiple sites with positive rheumatoid factor   Okay to refill Lidocaine Patches?

## 2020-07-08 ENCOUNTER — Ambulatory Visit: Payer: BC Managed Care – PPO | Admitting: Physical Medicine and Rehabilitation

## 2020-07-29 ENCOUNTER — Ambulatory Visit (HOSPITAL_COMMUNITY)
Admission: RE | Admit: 2020-07-29 | Discharge: 2020-07-29 | Disposition: A | Discharge status: Home / Self Care | Payer: BC Managed Care – PPO | Source: Ambulatory Visit | Attending: Rheumatology | Admitting: Rheumatology

## 2020-07-29 ENCOUNTER — Other Ambulatory Visit: Payer: Self-pay

## 2020-07-29 DIAGNOSIS — M0579 Rheumatoid arthritis with rheumatoid factor of multiple sites without organ or systems involvement: Secondary | ICD-10-CM | POA: Diagnosis present

## 2020-07-29 LAB — COMPREHENSIVE METABOLIC PANEL
ALT: 18 U/L (ref 0–44)
AST: 23 U/L (ref 15–41)
Albumin: 3.2 g/dL — ABNORMAL LOW (ref 3.5–5.0)
Alkaline Phosphatase: 68 U/L (ref 38–126)
Anion gap: 10 (ref 5–15)
BUN: 6 mg/dL (ref 6–20)
CO2: 25 mmol/L (ref 22–32)
Calcium: 9.2 mg/dL (ref 8.9–10.3)
Chloride: 108 mmol/L (ref 98–111)
Creatinine, Ser: 0.9 mg/dL (ref 0.44–1.00)
GFR, Estimated: >60 mL/min (ref >60)
Glucose, Bld: 115 mg/dL — ABNORMAL HIGH (ref 70–99)
Potassium: 3.4 mmol/L — ABNORMAL LOW (ref 3.5–5.1)
Sodium: 143 mmol/L (ref 135–145)
Total Bilirubin: 0.3 mg/dL (ref 0.3–1.2)
Total Protein: 6.6 g/dL (ref 6.5–8.1)

## 2020-07-29 LAB — CBC WITH DIFFERENTIAL/PLATELET
Abs Immature Granulocytes: 0.03 10*3/uL (ref 0.00–0.07)
Basophils Absolute: 0.1 10*3/uL (ref 0.0–0.1)
Basophils Relative: 1 %
Eosinophils Absolute: 0.2 10*3/uL (ref 0.0–0.5)
Eosinophils Relative: 3 %
HCT: 43.7 % (ref 36.0–46.0)
Hemoglobin: 13.2 g/dL (ref 12.0–15.0)
Immature Granulocytes: 0 %
Lymphocytes Relative: 48 %
Lymphs Abs: 4.1 10*3/uL — ABNORMAL HIGH (ref 0.7–4.0)
MCH: 26 pg (ref 26.0–34.0)
MCHC: 30.2 g/dL (ref 30.0–36.0)
MCV: 86.2 fL (ref 80.0–100.0)
Monocytes Absolute: 0.4 10*3/uL (ref 0.1–1.0)
Monocytes Relative: 5 %
Neutro Abs: 3.6 10*3/uL (ref 1.7–7.7)
Neutrophils Relative %: 43 %
Platelets: 258 10*3/uL (ref 150–400)
RBC: 5.07 MIL/uL (ref 3.87–5.11)
RDW: 12.8 % (ref 11.5–15.5)
WBC: 8.5 10*3/uL (ref 4.0–10.5)
nRBC: 0 % (ref 0.0–0.2)

## 2020-07-29 MED ORDER — ACETAMINOPHEN 325 MG PO TABS
650.0000 mg | ORAL_TABLET | ORAL | Status: DC
  Administered 2020-07-29: 650 mg via ORAL

## 2020-07-29 MED ORDER — DIPHENHYDRAMINE HCL 25 MG PO CAPS
25.0000 mg | ORAL_CAPSULE | ORAL | Status: DC
  Administered 2020-07-29: 25 mg via ORAL

## 2020-07-29 MED ORDER — ACETAMINOPHEN 325 MG PO TABS
ORAL_TABLET | ORAL | Status: AC
  Filled 2020-07-29: qty 2

## 2020-07-29 MED ORDER — SODIUM CHLORIDE 0.9 % IV SOLN
1000.0000 mg | INTRAVENOUS | Status: DC
  Administered 2020-07-29: 1000 mg via INTRAVENOUS
  Filled 2020-07-29: qty 40

## 2020-07-29 MED ORDER — DIPHENHYDRAMINE HCL 25 MG PO CAPS
ORAL_CAPSULE | ORAL | Status: AC
  Filled 2020-07-29: qty 1

## 2020-07-29 NOTE — Progress Notes (Signed)
Potassium is low.  Please notify patient and send the results to her PCP.  CBC is normal.

## 2020-08-01 ENCOUNTER — Other Ambulatory Visit: Payer: Self-pay | Admitting: Pharmacist

## 2020-08-01 DIAGNOSIS — Z79899 Other long term (current) drug therapy: Secondary | ICD-10-CM

## 2020-08-01 DIAGNOSIS — M0579 Rheumatoid arthritis with rheumatoid factor of multiple sites without organ or systems involvement: Secondary | ICD-10-CM

## 2020-08-01 NOTE — Progress Notes (Signed)
Next infusion scheduled for Orencia on 08/26/20 and due for updated orders.  Dose: Orencia 1000mg  every 28 days (dose based on patient's last recorded weight of 119.7 kg)  Last Clinic Visit: 05/13/20 Next Clinic Visit: 10/09/20  Last infusion: 07/29/20 Labs: 07/29/20 - CBC wnl - CMP showed low potassium (PCP notified); albumin continues to be low but stable  TB Gold: negative on 08/20/19  Orders placed for Orencia 1000mg  x 3 doses along with premedication of Tylenol and Benadryl to be administered 30 minutes before medication infusion.  Standing CBC with diff/platelet and CMP with GFR orders placed to be drawn every 2 months (next due April 2022 infusion).  Next TB gold due with upcoming infusion in March 2022.  Called patient and provided with phone number for [facility] Day [phone])  [facility], PharmD, MPH Clinical Pharmacist (Rheumatology and Pulmonology)

## 2020-08-05 ENCOUNTER — Ambulatory Visit: Payer: BC Managed Care – PPO | Admitting: Physical Medicine and Rehabilitation

## 2020-08-18 ENCOUNTER — Other Ambulatory Visit: Payer: Self-pay | Admitting: Orthopaedic Surgery

## 2020-08-18 ENCOUNTER — Other Ambulatory Visit: Payer: Self-pay | Admitting: Physician Assistant

## 2020-08-18 NOTE — Telephone Encounter (Signed)
Next Visit: 10/09/2020  Last Visit: 05/13/2020  Last Fill:05/13/2020  Dx: Rheumatoid arthritis involving multiple sites with positive rheumatoid factor  Current Dose per office note on 05/13/2020, FABB as prescribed, gabapentin and flexeril not mentioned  Okay to refill Fabb, Flexeril and Gabapentin?

## 2020-08-18 NOTE — Telephone Encounter (Signed)
Please advise 

## 2020-08-26 ENCOUNTER — Other Ambulatory Visit: Payer: Self-pay

## 2020-08-26 ENCOUNTER — Ambulatory Visit (HOSPITAL_COMMUNITY)
Admission: RE | Admit: 2020-08-26 | Discharge: 2020-08-26 | Disposition: A | Discharge status: Home / Self Care | Payer: BC Managed Care – PPO | Source: Ambulatory Visit | Attending: Rheumatology | Admitting: Rheumatology

## 2020-08-26 DIAGNOSIS — Z79899 Other long term (current) drug therapy: Secondary | ICD-10-CM | POA: Diagnosis not present

## 2020-08-26 DIAGNOSIS — M0579 Rheumatoid arthritis with rheumatoid factor of multiple sites without organ or systems involvement: Secondary | ICD-10-CM | POA: Diagnosis present

## 2020-08-26 MED ORDER — DIPHENHYDRAMINE HCL 25 MG PO CAPS
ORAL_CAPSULE | ORAL | Status: AC
  Administered 2020-08-26: 25 mg via ORAL
  Filled 2020-08-26: qty 1

## 2020-08-26 MED ORDER — ACETAMINOPHEN 325 MG PO TABS: 650.0000 mg | ORAL_TABLET | ORAL | Status: DC

## 2020-08-26 MED ORDER — SODIUM CHLORIDE 0.9 % IV SOLN
1000.0000 mg | INTRAVENOUS | Status: DC
  Administered 2020-08-26: 1000 mg via INTRAVENOUS
  Filled 2020-08-26: qty 40

## 2020-08-26 MED ORDER — DIPHENHYDRAMINE HCL 25 MG PO CAPS: 25.0000 mg | ORAL_CAPSULE | ORAL | Status: DC

## 2020-08-26 MED ORDER — ACETAMINOPHEN 325 MG PO TABS
ORAL_TABLET | ORAL | Status: AC
  Administered 2020-08-26: 650 mg via ORAL
  Filled 2020-08-26: qty 2

## 2020-08-28 LAB — QUANTIFERON-TB GOLD PLUS (RQFGPL)
QuantiFERON Mitogen Value: >10 IU/mL
QuantiFERON Nil Value: 0.06 IU/mL
QuantiFERON TB1 Ag Value: 0.08 IU/mL
QuantiFERON TB2 Ag Value: 0.08 IU/mL

## 2020-08-28 LAB — QUANTIFERON-TB GOLD PLUS: QuantiFERON-TB Gold Plus: NEGATIVE

## 2020-08-28 NOTE — Progress Notes (Signed)
TB gold negative

## 2020-09-03 ENCOUNTER — Emergency Department (HOSPITAL_BASED_OUTPATIENT_CLINIC_OR_DEPARTMENT_OTHER): Payer: BC Managed Care – PPO

## 2020-09-03 ENCOUNTER — Telehealth: Payer: Self-pay | Admitting: Cardiology

## 2020-09-03 ENCOUNTER — Emergency Department (HOSPITAL_BASED_OUTPATIENT_CLINIC_OR_DEPARTMENT_OTHER)
Admission: EM | Admit: 2020-09-03 | Discharge: 2020-09-03 | Disposition: A | Discharge status: Home / Self Care | Payer: BC Managed Care – PPO | Attending: Emergency Medicine | Admitting: Emergency Medicine

## 2020-09-03 ENCOUNTER — Other Ambulatory Visit: Payer: Self-pay

## 2020-09-03 DIAGNOSIS — R079 Chest pain, unspecified: Secondary | ICD-10-CM | POA: Diagnosis present

## 2020-09-03 DIAGNOSIS — Z79899 Other long term (current) drug therapy: Secondary | ICD-10-CM | POA: Diagnosis not present

## 2020-09-03 DIAGNOSIS — R0789 Other chest pain: Secondary | ICD-10-CM

## 2020-09-03 DIAGNOSIS — I2510 Atherosclerotic heart disease of native coronary artery without angina pectoris: Secondary | ICD-10-CM | POA: Diagnosis not present

## 2020-09-03 DIAGNOSIS — I10 Essential (primary) hypertension: Secondary | ICD-10-CM | POA: Insufficient documentation

## 2020-09-03 LAB — CBC
HCT: 45.1 % (ref 36.0–46.0)
Hemoglobin: 14.5 g/dL (ref 12.0–15.0)
MCH: 27 pg (ref 26.0–34.0)
MCHC: 32.2 g/dL (ref 30.0–36.0)
MCV: 83.8 fL (ref 80.0–100.0)
Platelets: 272 10*3/uL (ref 150–400)
RBC: 5.38 MIL/uL — ABNORMAL HIGH (ref 3.87–5.11)
RDW: 12.9 % (ref 11.5–15.5)
WBC: 10.3 10*3/uL (ref 4.0–10.5)
nRBC: 0 % (ref 0.0–0.2)

## 2020-09-03 LAB — COMPREHENSIVE METABOLIC PANEL
ALT: 16 U/L (ref 0–44)
AST: 19 U/L (ref 15–41)
Albumin: 3.4 g/dL — ABNORMAL LOW (ref 3.5–5.0)
Alkaline Phosphatase: 76 U/L (ref 38–126)
Anion gap: 8 (ref 5–15)
BUN: 13 mg/dL (ref 6–20)
CO2: 27 mmol/L (ref 22–32)
Calcium: 8.8 mg/dL — ABNORMAL LOW (ref 8.9–10.3)
Chloride: 103 mmol/L (ref 98–111)
Creatinine, Ser: 0.92 mg/dL (ref 0.44–1.00)
GFR, Estimated: >60 mL/min (ref >60)
Glucose, Bld: 85 mg/dL (ref 70–99)
Potassium: 3.6 mmol/L (ref 3.5–5.1)
Sodium: 138 mmol/L (ref 135–145)
Total Bilirubin: 0.2 mg/dL — ABNORMAL LOW (ref 0.3–1.2)
Total Protein: 7 g/dL (ref 6.5–8.1)

## 2020-09-03 LAB — TROPONIN I (HIGH SENSITIVITY)
Troponin I (High Sensitivity): 3 ng/L (ref <18)
Troponin I (High Sensitivity): 3 ng/L (ref <18)

## 2020-09-03 LAB — D-DIMER, QUANTITATIVE: D-Dimer, Quant: 0.29 ug/mL-FEU (ref 0.00–0.50)

## 2020-09-03 MED ORDER — ASPIRIN 81 MG PO CHEW
324.0000 mg | CHEWABLE_TABLET | Freq: Once | ORAL | Status: AC
  Administered 2020-09-03: 324 mg via ORAL
  Filled 2020-09-03: qty 4

## 2020-09-03 NOTE — ED Notes (Signed)
Unable to obtain PIV, RN to attempt u/s guided IV

## 2020-09-03 NOTE — ED Triage Notes (Signed)
Pt states sent by cardiologist for evaluation, reports chest pain x1 month.  Does report dizziness, loss of appetite. Denies vomiting.

## 2020-09-03 NOTE — ED Provider Notes (Signed)
[facility] EMERGENCY DEPARTMENT Provider Note   CSN: [id number] Arrival date & time: 09/03/20  1147     History Chief Complaint  Patient presents with  . Chest Pain    [name] [name] is a 55 y.o. female.  55yo F w/ PMH below including CAD, anxiety/depression, HLD, RA who p/w chest pain.  Patient states that she has had 1 month of constant, nonradiating right-sided chest pain that she describes as a sharp and squeezing pain that fluctuates in intensity but is always there.  Pain initially began when she got close to the date of anniversary of her son's death and she initially dismissed it as anxiety but symptoms have persisted. She denies SOB, has had decreased appetite, no vomiting. No cough/cold symptoms, abdominal pain, leg swelling, or recent illness. She spoke to cardiology clinic who sent her here.   The history is provided by the patient.  Chest Pain      Past Medical History:  Diagnosis Date  . Anxiety   . Bursitis   . Coronary artery calcification seen on CAT scan    minimal CAD with 30% prox and mild LAD  . Depression   . Diastolic dysfunction   . Dyslipidemia 01/30/2015  . Esophageal ring   . Fibromyalgia   . Hiatal hernia   . HSV-1 infection   . Morbid obesity (HCC)   . Osteoarthritis   . Rheumatoid arthritis(714.0)    M05.79  . Rheumatoid arthritis, seropositive, 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HCC)    Treated with Orencia, TB neg 09/12/2015  . Spondylolysis     Patient Active Problem List   Diagnosis Date Noted  . Amenorrhea 06/09/2020  . Anxiety 02/20/2018  . Fatigue 02/20/2018  . Low blood potassium 02/20/2018  . Allergic rhinitis 12/14/2017  . Fever blister 12/14/2017  . Head congestion 12/14/2017  . Hyperlipidemia 12/14/2017  . Lateral epicondylitis of right elbow 05/04/2017  . Sacral pain 03/23/2017  . High risk medication use 08/03/2016  . Fibromyalgia syndrome 08/03/2016  . Abnormal cardiac CT angiography   . Sacrococcygeal pain  02/20/2016  . Rheumatoid arthritis involving 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HCC) 01/24/2016  . 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 Dyslipidemia 01/30/2015  . 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 Essential hypertension 08/25/2014  . Morbid obesity (HCC) 08/25/2014  . CAD (coronary artery disease), native coronary artery 08/24/2014  . Numbness and tingling in left arm   . Arm numbness left 08/22/2014  . Chest pain 08/22/2014  . Gastroesophageal reflux disease without esophagitis 07/23/2014  . Coronary artery calcification seen on CAT scan 02/19/2014  . Hip pain 10/23/2013  . Solitary pulmonary nodule 08/28/2013  . Chest pain, atypical 07/18/2013  . Heart palpitations 06/27/2013  . Cough 06/04/2013  . Diastolic dysfunction 05/18/2013  . SOB (shortness of breath) 05/09/2013  . Other malaise and fatigue 01/21/2013  . Stress and adjustment reaction 12/09/2012  . Right sided abdominal pain 11/29/2012  . 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/21/2012  . Morbid obesity with BMI of 50.0-59.9, adult (HCC) 11/21/2012  . Arthritis 07/13/2012  . Depression 07/13/2012  . Routine health maintenance 05/07/2012  . Migraine 06/07/1998    Past Surgical History:  Procedure Laterality Date  . CARDIAC CATHETERIZATION N/A 06/11/2016   Procedure: Left Heart Cath and Coronary Angiography;  Surgeon: Corky Crafts, MD;  Location: [facility] INVASIVE CV LAB;  Service: Cardiovascular;  Laterality: N/A;  . CESAREAN SECTION    . COLONOSCOPY  07/27/2017  . ESOPHAGEAL MANOMETRY  N/A 10/28/2014   Procedure: ESOPHAGEAL MANOMETRY (EM);  Surgeon: Hilarie Fredrickson, MD;  Location: [facility] ENDOSCOPY;  Service: Endoscopy;  Laterality: N/A;  . HERNIA REPAIR     reports surgery on 3 hernias, with 2 more present  . LAPAROSCOPIC GASTRIC SLEEVE RESECTION  11/21/12   [facility]  . LEFT HEART CATHETERIZATION WITH CORONARY ANGIOGRAM N/A 08/26/2014   Procedure: LEFT HEART CATHETERIZATION  WITH CORONARY ANGIOGRAM;  Surgeon: Runell Gess, MD;  Location: [facility] CATH LAB;  Service: Cardiovascular;  Laterality: N/A;  . TUBAL LIGATION       OB History   No obstetric history on file.     Family History  Problem Relation Age of Onset  . Hyperlipidemia Mother   . Depression Mother   . Sarcoidosis Father   . Lung disease Father        Pleural Mesothelioma  . Cancer Father   . Heart attack Paternal Grandmother   . Hypertension Paternal Grandmother   . Hypertension Maternal Grandmother   . Diabetes Maternal Grandmother   . Stroke Neg Hx   . Colon cancer Neg Hx   . Esophageal cancer Neg Hx   . Rectal cancer Neg Hx   . Stomach cancer Neg Hx     Social History   Tobacco Use  . Smoking status: Never Smoker  . Smokeless tobacco: Never Used  Vaping Use  . Vaping Use: Never used  Substance Use Topics  . Alcohol use: No  . Drug use: No    Home Medications Prior to Admission medications   Medication Sig Start Date End Date Taking? Authorizing Provider  abatacept (ORENCIA) 250 MG injection See admin instructions.    [provider]  Acetaminophen-Codeine 300-30 MG tablet TAKE 1-2 TABLETS BY MOUTH EVERY 8 HOURS AS NEEDED. 06/11/20   Kathryne Hitch, MD  Adapalene 0.3 % gel Apply 1 application topically daily as needed (acne).  12/31/14   [provider]  Alcohol Swabs (ALCOHOL WIPES) 70 % PADS Alcohol Prep Pads    [provider]  ARIPiprazole (ABILIFY) 2 MG tablet Take 1 tablet (2 mg total) by mouth daily. 04/22/15   Benjaman Pott, MD  atorvastatin (LIPITOR) 80 MG tablet Add'l Sig Add'l Sig oral Add'l Sig 10/10/19   [provider]  azelastine (ASTELIN) 0.1 % nasal spray Place 2 sprays into both nostrils 2 (two) times daily. 01/12/20   Cathie Hoops, Amy V, PA-C  benzonatate (TESSALON) 100 MG capsule Take 1 capsule (100 mg total) by mouth every 8 (eight) hours. 02/06/20   Hall-Potvin, [place], PA-C  buPROPion (WELLBUTRIN XL) 150 MG 24 hr tablet  Take 450 mg by mouth every morning. 05/23/19   [provider]  buPROPion (WELLBUTRIN XL) 300 MG 24 hr tablet Take 1 tablet (300 mg total) by mouth daily. 12/28/14   Adonis Brook, NP  cetirizine (ZYRTEC ALLERGY) 10 MG tablet Take 1 tablet (10 mg total) by mouth daily. 02/06/20   Hall-Potvin, [place], PA-C  cetirizine-pseudoephedrine (ZYRTEC-D ALLERGY & CONGESTION) 5-120 MG tablet Zyrtec-D 5 mg-120 mg tablet,extended release  TAKE 1 TABLET BY MOUTH EVERY 12 HOURS FOR 10 DAYS    [provider]  clindamycin-benzoyl peroxide (BENZACLIN) gel Apply 1 application topically daily as needed (acne).  02/13/15   [provider]  clobetasol cream (TEMOVATE) 0.05 % Apply 1 application topically daily as needed (skin discoloration).  04/22/15   [provider]  cyclobenzaprine (FLEXERIL) 10 MG tablet TAKE 1 TABLET BY MOUTH DAILY AT BEDTIME AS  NEEDED FOR MUSCLE SPASMS. 08/18/20   Gearldine Bienenstock, PA-C  diazepam (VALIUM) 5 MG tablet Take 5 mg by mouth at bedtime. 05/05/20   [provider]  diclofenac Sodium (VOLTAREN) 1 % GEL Apply 2 grams to 4 grams to the affected area up to 4 times daily as needed. 05/13/20   Gearldine Bienenstock, PA-C  diltiazem (CARDIZEM CD) 180 MG 24 hr capsule Take 1 capsule (180 mg total) by mouth daily. 06/09/20   Manson Passey, PA  diltiazem (TIAZAC) 180 MG 24 hr capsule Take by mouth. 08/27/13   [provider]  doxepin (SINEQUAN) 25 MG capsule Take 25 mg by mouth at bedtime as needed. 04/28/19   [provider]  EPINEPHrine 0.3 mg/0.3 mL IJ SOAJ injection epinephrine 0.3 mg/0.3 mL injection, auto-injector 07/02/19   [provider]  ezetimibe (ZETIA) 10 MG tablet Take 10 mg by mouth daily. 10/22/19   [provider]  FABB 2.2-25-1 MG TABS TAKE 2 TABLETS BY MOUTH EVERY DAY 08/18/20   Gearldine Bienenstock, PA-C  famciclovir (FAMVIR) 500 MG tablet Take 15,000 mg by mouth daily as needed (fever blister).  01/21/15   [provider]  fluticasone (FLONASE) 50 MCG/ACT nasal spray Place 1 spray into both nostrils daily. 02/06/20   Hall-Potvin, [place], PA-C  gabapentin (NEURONTIN) 300 MG capsule TAKE 1 CAPSULE BY MOUTH 3 TIMES A DAY AS NEEDED FOR PAIN 08/18/20   Gearldine Bienenstock, PA-C  hydrocortisone 2.5 % cream SMARTSIG:1 Topical Every Night 11/06/19   [provider]  ibuprofen (ADVIL) 800 MG tablet ibuprofen 800 mg tablet  TAKE 1 TABLET BY MOUTH THREE TIMES A DAY AS NEEDED    [provider]  ketoconazole (NIZORAL) 2 % cream  03/14/20   [provider]  ketoconazole (NIZORAL) 2 % shampoo  06/03/20   [provider]  levothyroxine (SYNTHROID) 25 MCG tablet levothyroxine 25 mcg tablet  TAKE 1 TABLET IN THE MORNING ON AN EMPTY STOMACH 30 MINUTES BEFORE FOOD    [provider]  lidocaine (LIDODERM) 5 % PLACE 1 PATCH ONTO THE SKIN DAILY. REMOVE & DISCARD PATCH WITHIN 12 HOURS OR AS DIRECTED BY MD 07/01/20   Gearldine Bienenstock, PA-C  LORazepam (ATIVAN) 1 MG tablet Take 1 tablet (1 mg total) by mouth 3 (three) times daily as needed for anxiety. 10/09/19   Moshe Cipro, NP  Methotrexate 10 MG/0.4ML SOSY 1 tab    [provider]  Methotrexate, PF, (RASUVO) 25 MG/0.5ML SOAJ INJECT ONE PEN SUBCUTANEOUSLY ONCE EVERY WEEK. STORE AT ROOM TEMPERATURE BETWEEN 68 - 77 DEGREES F. 05/13/20   Gearldine Bienenstock, PA-C  nitroGLYCERIN (NITROSTAT) 0.4 MG SL tablet Place 1 tablet (0.4 mg total) under the tongue every 5 (five) minutes as needed for chest pain. 05/01/19   Rosalio Macadamia, NP  [employer] [employer] [facility] powder Apply topically. 03/17/20   [provider]  Oxymetazoline HCl (NASAL SPRAY) 0.05 % SOLN Nasal Decongestant (oxymetazoline) 0.05 % spray    [provider]  propranolol (INDERAL) 10 MG tablet Take 10 mg by mouth daily.    [provider]  rosuvastatin (CRESTOR) 20 MG tablet Take 20 mg by mouth. 09/21/16 05/13/20  [provider]  tretinoin (RETIN-A) 0.025 %  cream Apply 1 application topically daily as needed (acne).  02/11/15   [provider]  triamcinolone cream (KENALOG) 0.1 % Apply 1 application topically daily as needed (rash). Itching  04/13/15   [provider]  Vitamin D, Ergocalciferol, (DRISDOL)  1.25 MG (50000 UT) CAPS capsule Take 50,000 Units by mouth See admin instructions. Take 1 tablet (50000 units) by mouth every Monday, Wednesday, Saturday    [provider]  XERESE 5-1 % CREA Apply 1 application topically daily as needed (fever blisters).  12/30/14   [provider]    Allergies    Influenza vaccines, Amitriptyline, and Isosorbide  Review of Systems   Review of Systems  Cardiovascular: Positive for chest pain.   All other systems reviewed and are negative except that which was mentioned in HPI  Physical Exam Updated Vital Signs BP 118/85   Pulse 67   Temp 98 F (36.7 C) (Oral)   Resp 15   Ht 5\' 2"  (1.575 m)   Wt 119.7 kg   SpO2 100%   BMI 48.29 kg/m   Physical Exam Constitutional:      General: She is not in acute distress.    Appearance: Normal appearance.  HENT:     Head: Normocephalic and atraumatic.  Eyes:     Conjunctiva/sclera: Conjunctivae normal.  Cardiovascular:     Rate and Rhythm: Normal rate and regular rhythm.     Heart sounds: Normal heart sounds. No murmur heard.   Pulmonary:     Effort: Pulmonary effort is normal.     Breath sounds: Normal breath sounds.  Abdominal:     General: Abdomen is flat. Bowel sounds are normal. There is no distension.     Palpations: Abdomen is soft.     Tenderness: There is no abdominal tenderness.  Musculoskeletal:     Right lower leg: No edema.     Left lower leg: No edema.  Skin:    General: Skin is warm and dry.  Neurological:     Mental Status: She is alert and oriented to person, place, and time.     Comments: fluent  Psychiatric:        Mood and Affect: Mood normal.        Behavior: Behavior normal.     ED  Results / Procedures / Treatments   Labs (all labs ordered are listed, but only abnormal results are displayed) Labs Reviewed  COMPREHENSIVE METABOLIC PANEL - Abnormal; Notable for the following components:      Result Value   Calcium 8.8 (*)    Albumin 3.4 (*)    Total Bilirubin 0.2 (*)    All other components within normal limits  CBC - Abnormal; Notable for the following components:   RBC 5.38 (*)    All other components within normal limits  D-DIMER, QUANTITATIVE  TROPONIN I (HIGH SENSITIVITY)  TROPONIN I (HIGH SENSITIVITY)    EKG EKG Interpretation  Date/Time:  Wednesday September 03 2020 12:04:26 EDT Ventricular Rate:  72 PR Interval:  116 QRS Duration: 94 QT Interval:  403 QTC Calculation: 441 R Axis:   114 Text Interpretation: Sinus rhythm Borderline short PR interval Right axis deviation Low voltage, precordial leads RSR' in V1 or V2, probably normal variant No significant change since last tracing Confirmed by 10-02-1993 [phone]) on 09/03/2020 12:17:01 PM   Radiology DG Chest 2 View  Result Date: 09/03/2020 CLINICAL DATA:  Chest pain for 1 month. Additional history provided: Patient reports chest pain for 1 month with intermittent lightheadedness, history of hypertension, patient reports history of COVID January 2022. EXAM: CHEST - 2 VIEW COMPARISON:  Prior chest radiographs 04/15/2019 and earlier. FINDINGS: Heart size within normal limits. No appreciable airspace consolidation. No evidence of pleural effusion or pneumothorax.  No acute bony abnormality identified. IMPRESSION: No evidence of active cardiopulmonary disease. Electronically Signed   By: Jackey Loge DO   On: 09/03/2020 12:50    Procedures Procedures   Medications Ordered in ED Medications  aspirin chewable tablet 324 mg (324 mg Oral Given 09/03/20 1246)    ED Course  I have reviewed the triage vital signs and the nursing notes.  Pertinent labs & imaging results that were available during my care of  the patient were reviewed by me and considered in my medical decision making (see chart for details).    MDM Rules/Calculators/A&P                          Well-appearing, normal vital signs.  EKG unchanged from previous, no ischemic changes.  Lab work shows normal CMP and CBC, negative initial troponin, normal D-dimer making PE very unlikely.  Chest x-ray clear. I reviewed her chart and she has had several previous evaluations for similar chest pain symptoms. She has previous reassuring cath in 2018 and normal ECHO in 2020. Sx previously considered non-cardiac. Given sx currently have been going on continuously for a month, I feel ACS is less likely. Will obtain serial trop but if negative I anticipate she will be ok for discharge with cardiology follow up.  Final Clinical Impression(s) / ED Diagnoses Final diagnoses:  None    Rx / DC Orders ED Discharge Orders    None       Cashlyn Huguley, Ambrose Finland, MD 09/03/20 1513

## 2020-09-03 NOTE — ED Provider Notes (Signed)
  Physical Exam  BP 128/79   Pulse 73   Temp 98 F (36.7 C) (Oral)   Resp 15   Ht 5\' 2"  (1.575 m)   Wt 119.7 kg   SpO2 100%   BMI 48.29 kg/m   Physical Exam  ED Course/Procedures     Procedures  MDM  Was awaiting a second troponin.  ED work-up was negative, and there is no suggestion of ACS, PE, or pneumonia.  She will be discharged home with recommendations to follow-up with her cardiologist.       , MD 09/03/20 [phone]

## 2020-09-03 NOTE — Telephone Encounter (Signed)
Being having cp on and off x 1 month.   No sob.   Just chest tightness and feeling lite headed.

## 2020-09-03 NOTE — Telephone Encounter (Signed)
Spoke with pt who reports CP x 1 month.  Pt states pain is "always there and never totally goes away but is better at times than others." Pain radiates to jaws. Pt reports her HR on Sunday was in the 140's with BP 170/101.  Today her BP is 121/73 with HR of 86.  Pt has not tried nitro as she does not currently have medication.  She reports she is complaint with other meds.  Pt states her son dies a year ago and she has contributed her CP to anxiety as the anniversary of his death approached.   Pt advised due to her current, active CP she should be seen in ED for further evaluation.  Pt advised to have someone drive her or to call [phone].  Pt verbalizes understanding and agrees with current plan.

## 2020-09-04 ENCOUNTER — Other Ambulatory Visit: Payer: Self-pay | Admitting: Physician Assistant

## 2020-09-04 ENCOUNTER — Other Ambulatory Visit: Payer: Self-pay | Admitting: Rheumatology

## 2020-09-04 NOTE — Telephone Encounter (Signed)
Next Visit: 10/09/2020  Last Visit: 05/13/2020  Last Fill: 2/72/021  BW:[id number] arthritis involving multiple sites with positive rheumatoid factor     Current Dose per office note on 05/13/2020  diclofenac Sodium (VOLTAREN) 1 % GEL     Sig: Apply 2 grams to 4 grams to the affected area up to 4 times daily as needed.    Dispense:  400 g    Refill:  4     Okay to refill Voltaren?

## 2020-09-05 ENCOUNTER — Other Ambulatory Visit: Payer: Self-pay | Admitting: *Deleted

## 2020-09-05 MED ORDER — DICLOFENAC SODIUM 1 % EX GEL: CUTANEOUS | 4 refills | Status: DC

## 2020-09-05 NOTE — Telephone Encounter (Signed)
I called patient, patient does not take folic acid-discontinued in patient's chart - RX cancelled at pharmacy, patient takes FABB, Voltaren gel resent to Karin Golden at [place].

## 2020-09-25 ENCOUNTER — Ambulatory Visit (HOSPITAL_COMMUNITY)
Admission: RE | Admit: 2020-09-25 | Discharge: 2020-09-25 | Disposition: A | Discharge status: Home / Self Care | Payer: BC Managed Care – PPO | Source: Ambulatory Visit | Attending: Rheumatology | Admitting: Rheumatology

## 2020-09-25 ENCOUNTER — Other Ambulatory Visit: Payer: Self-pay

## 2020-09-25 DIAGNOSIS — Z79899 Other long term (current) drug therapy: Secondary | ICD-10-CM | POA: Diagnosis present

## 2020-09-25 DIAGNOSIS — M0579 Rheumatoid arthritis with rheumatoid factor of multiple sites without organ or systems involvement: Secondary | ICD-10-CM | POA: Diagnosis present

## 2020-09-25 LAB — CBC WITH DIFFERENTIAL/PLATELET
Abs Immature Granulocytes: 0.03 10*3/uL (ref 0.00–0.07)
Basophils Absolute: 0.1 10*3/uL (ref 0.0–0.1)
Basophils Relative: 1 %
Eosinophils Absolute: 0.2 10*3/uL (ref 0.0–0.5)
Eosinophils Relative: 2 %
HCT: 45.1 % (ref 36.0–46.0)
Hemoglobin: 13.7 g/dL (ref 12.0–15.0)
Immature Granulocytes: 0 %
Lymphocytes Relative: 48 %
Lymphs Abs: 4.3 10*3/uL — ABNORMAL HIGH (ref 0.7–4.0)
MCH: 26.2 pg (ref 26.0–34.0)
MCHC: 30.4 g/dL (ref 30.0–36.0)
MCV: 86.4 fL (ref 80.0–100.0)
Monocytes Absolute: 0.6 10*3/uL (ref 0.1–1.0)
Monocytes Relative: 7 %
Neutro Abs: 3.8 10*3/uL (ref 1.7–7.7)
Neutrophils Relative %: 42 %
Platelets: 286 10*3/uL (ref 150–400)
RBC: 5.22 MIL/uL — ABNORMAL HIGH (ref 3.87–5.11)
RDW: 12.9 % (ref 11.5–15.5)
WBC: 9 10*3/uL (ref 4.0–10.5)
nRBC: 0 % (ref 0.0–0.2)

## 2020-09-25 LAB — COMPREHENSIVE METABOLIC PANEL
ALT: 20 U/L (ref 0–44)
AST: 21 U/L (ref 15–41)
Albumin: 3.2 g/dL — ABNORMAL LOW (ref 3.5–5.0)
Alkaline Phosphatase: 72 U/L (ref 38–126)
Anion gap: 6 (ref 5–15)
BUN: 9 mg/dL (ref 6–20)
CO2: 27 mmol/L (ref 22–32)
Calcium: 9 mg/dL (ref 8.9–10.3)
Chloride: 108 mmol/L (ref 98–111)
Creatinine, Ser: 0.95 mg/dL (ref 0.44–1.00)
GFR, Estimated: >60 mL/min (ref >60)
Glucose, Bld: 91 mg/dL (ref 70–99)
Potassium: 3.5 mmol/L (ref 3.5–5.1)
Sodium: 141 mmol/L (ref 135–145)
Total Bilirubin: 0.4 mg/dL (ref 0.3–1.2)
Total Protein: 6.8 g/dL (ref 6.5–8.1)

## 2020-09-25 MED ORDER — ACETAMINOPHEN 325 MG PO TABS
650.0000 mg | ORAL_TABLET | ORAL | Status: DC
  Administered 2020-09-25: 650 mg via ORAL

## 2020-09-25 MED ORDER — SODIUM CHLORIDE 0.9 % IV SOLN
1000.0000 mg | INTRAVENOUS | Status: DC
  Administered 2020-09-25: 1000 mg via INTRAVENOUS
  Filled 2020-09-25: qty 40

## 2020-09-25 MED ORDER — DIPHENHYDRAMINE HCL 25 MG PO CAPS
25.0000 mg | ORAL_CAPSULE | ORAL | Status: DC
  Administered 2020-09-25: 25 mg via ORAL

## 2020-09-25 MED ORDER — DIPHENHYDRAMINE HCL 25 MG PO CAPS
ORAL_CAPSULE | ORAL | Status: AC
  Filled 2020-09-25: qty 1

## 2020-09-25 MED ORDER — ACETAMINOPHEN 325 MG PO TABS
ORAL_TABLET | ORAL | Status: AC
  Filled 2020-09-25: qty 2

## 2020-09-25 NOTE — Progress Notes (Signed)
Lab are stable.

## 2020-10-08 ENCOUNTER — Telehealth: Payer: Self-pay

## 2020-10-08 NOTE — Progress Notes (Deleted)
Office Visit Note  Patient: [name] [name]             Date of Birth: [birth year]           MRN: [id number]             PCP: [name], MD Referring: [name], MD Visit Date: 10/09/2020 Occupation: @GUAROCC @  Subjective:  No chief complaint on file.   History of Present Illness: [name] [name] is a 54 y.o. female ***   Activities of Daily Living:  Patient reports morning stiffness for *** {minute/hour:19697}.   Patient {ACTIONS;DENIES/REPORTS:21021675::"Denies"} nocturnal pain.  Difficulty dressing/grooming: {ACTIONS;DENIES/REPORTS:21021675::"Denies"} Difficulty climbing stairs: {ACTIONS;DENIES/REPORTS:21021675::"Denies"} Difficulty getting out of chair: {ACTIONS;DENIES/REPORTS:21021675::"Denies"} Difficulty using hands for taps, buttons, cutlery, and/or writing: {ACTIONS;DENIES/REPORTS:21021675::"Denies"}  No Rheumatology ROS completed.   PMFS History:  Patient Active Problem List   Diagnosis Date Noted  . Amenorrhea 06/09/2020  . Anxiety 02/20/2018  . Fatigue 02/20/2018  . Low blood potassium 02/20/2018  . Allergic rhinitis 12/14/2017  . Fever blister 12/14/2017  . Head congestion 12/14/2017  . Hyperlipidemia 12/14/2017  . Lateral epicondylitis of right elbow 05/04/2017  . Sacral pain 03/23/2017  . High risk medication use 08/03/2016  . Fibromyalgia syndrome 08/03/2016  . Abnormal cardiac CT angiography   . Sacrococcygeal pain 02/20/2016  . Rheumatoid arthritis involving 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HCC) 01/24/2016  . 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 Dyslipidemia 01/30/2015  . 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 Essential hypertension 08/25/2014  . Morbid obesity (HCC) 08/25/2014  . CAD (coronary artery disease), native coronary artery 08/24/2014  . Numbness and tingling in left arm   . Arm numbness left 08/22/2014  . Chest pain 08/22/2014  . Gastroesophageal reflux disease without  esophagitis 07/23/2014  . Coronary artery calcification seen on CAT scan 02/19/2014  . Hip pain 10/23/2013  . Solitary pulmonary nodule 08/28/2013  . Chest pain, atypical 07/18/2013  . Heart palpitations 06/27/2013  . Cough 06/04/2013  . Diastolic dysfunction 05/18/2013  . SOB (shortness of breath) 05/09/2013  . Other malaise and fatigue 01/21/2013  . Stress and adjustment reaction 12/09/2012  . Right sided abdominal pain 11/29/2012  . 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/21/2012  . Morbid obesity with BMI of 50.0-59.9, adult (HCC) 11/21/2012  . Arthritis 07/13/2012  . Depression 07/13/2012  . Routine health maintenance 05/07/2012  . Migraine 06/07/1998    Past Medical History:  Diagnosis Date  . Anxiety   . Bursitis   . Coronary artery calcification seen on CAT scan    minimal CAD with 30% prox and mild LAD  . Depression   . Diastolic dysfunction   . Dyslipidemia 01/30/2015  . Esophageal ring   . Fibromyalgia   . Hiatal hernia   . HSV-1 infection   . Morbid obesity (HCC)   . Osteoarthritis   . Rheumatoid arthritis(714.0)    M05.79  . Rheumatoid arthritis, seropositive, 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HCC)    Treated with Orencia, TB neg 09/12/2015  . Spondylolysis     Family History  Problem Relation Age of Onset  . Hyperlipidemia Mother   . Depression Mother   . Sarcoidosis Father   . Lung disease Father        Pleural Mesothelioma  . Cancer Father   . Heart attack Paternal Grandmother   . Hypertension Paternal Grandmother   . Hypertension Maternal Grandmother   . Diabetes Maternal Grandmother   . Stroke Neg Hx   . Colon cancer Neg  Hx   . Esophageal cancer Neg Hx   . Rectal cancer Neg Hx   . Stomach cancer Neg Hx    Past Surgical History:  Procedure Laterality Date  . CARDIAC CATHETERIZATION N/A 06/11/2016   Procedure: Left Heart Cath and Coronary Angiography;  Surgeon: Corky Crafts, MD;  Location: [facility] INVASIVE CV LAB;  Service: Cardiovascular;  Laterality:  N/A;  . CESAREAN SECTION    . COLONOSCOPY  07/27/2017  . ESOPHAGEAL MANOMETRY N/A 10/28/2014   Procedure: ESOPHAGEAL MANOMETRY (EM);  Surgeon: Hilarie Fredrickson, MD;  Location: [facility] ENDOSCOPY;  Service: Endoscopy;  Laterality: N/A;  . HERNIA REPAIR     reports surgery on 3 hernias, with 2 more present  . LAPAROSCOPIC GASTRIC SLEEVE RESECTION  11/21/12   [facility]  . LEFT HEART CATHETERIZATION WITH CORONARY ANGIOGRAM N/A 08/26/2014   Procedure: LEFT HEART CATHETERIZATION WITH CORONARY ANGIOGRAM;  Surgeon: Runell Gess, MD;  Location: [facility] CATH LAB;  Service: Cardiovascular;  Laterality: N/A;  . TUBAL LIGATION     Social History   Social History Narrative   Marital Status: Married Probation officer)    Children:  Son [name]) Daughter [name])    Pets: None    Living Situation: Lives with husband and children    Occupation: Occupational psychologist Administrator)     Education: Oncologist (Psychology)     Tobacco Use/Exposure:  None    Alcohol Use:  Occasional   Drug Use:  None   Diet:  Regular   Exercise: Walking or Treadmill (2 x week)   Hobbies: Clinical cytogeneticist, [employer]ations.               Immunization History  Administered Date(s) Administered  . PFIZER(Purple Top)SARS-COV-2 Vaccination 08/30/2019, 09/20/2019  . Tdap 09/21/2016     Objective: Vital Signs: There were no vitals taken for this visit.   Physical Exam   Musculoskeletal Exam: ***  CDAI Exam: CDAI Score: -- Patient Global: --; Provider Global: -- Swollen: --; Tender: -- Joint Exam 10/09/2020   No joint exam has been documented for this visit   There is currently no information documented on the homunculus. Go to the Rheumatology activity and complete the homunculus joint exam.  Investigation: No additional findings.  Imaging: No results found.  Recent Labs: Lab Results  Component Value Date   WBC 9.0 09/25/2020   HGB 13.7 09/25/2020   PLT 286 09/25/2020   NA 141 09/25/2020   K 3.5  09/25/2020   CL 108 09/25/2020   CO2 27 09/25/2020   GLUCOSE 91 09/25/2020   BUN 9 09/25/2020   CREATININE 0.95 09/25/2020   BILITOT 0.4 09/25/2020   ALKPHOS 72 09/25/2020   AST 21 09/25/2020   ALT 20 09/25/2020   PROT 6.8 09/25/2020   ALBUMIN 3.2 (L) 09/25/2020   CALCIUM 9.0 09/25/2020   GFRAA >60 12/26/2019   QFTBGOLD Negative 09/01/2016   QFTBGOLDPLUS Negative 08/26/2020    Speciality Comments: ORENCIA 1000 mg x 4 weeks  Procedures:  No procedures performed Allergies: Influenza vaccines, Amitriptyline, and Isosorbide   Assessment / Plan:     Visit Diagnoses: No diagnosis found.  Orders: No orders of the defined types were placed in this encounter.  No orders of the defined types were placed in this encounter.   Face-to-face time spent with patient was *** minutes. Greater than 50% of time was spent in counseling and coordination of care.  Follow-Up Instructions: No follow-ups on file.   Ellen Henri, CMA  Note -  This record has been created using [employer].  Chart creation errors have been sought, but may not always  have been located. Such creation errors do not reflect on  the standard of medical care.

## 2020-10-08 NOTE — Telephone Encounter (Signed)
Called and left a VM advising patient to call back to schedule for gel injection with Richardean Canal.  Received Euflexxa samples for right knee for patient. No Co-pay

## 2020-10-09 ENCOUNTER — Ambulatory Visit: Payer: BC Managed Care – PPO | Admitting: Rheumatology

## 2020-10-09 DIAGNOSIS — Z79899 Other long term (current) drug therapy: Secondary | ICD-10-CM

## 2020-10-09 DIAGNOSIS — Z8659 Personal history of other mental and behavioral disorders: Secondary | ICD-10-CM

## 2020-10-09 DIAGNOSIS — R911 Solitary pulmonary nodule: Secondary | ICD-10-CM

## 2020-10-09 DIAGNOSIS — E785 Hyperlipidemia, unspecified: Secondary | ICD-10-CM

## 2020-10-09 DIAGNOSIS — R5383 Other fatigue: Secondary | ICD-10-CM

## 2020-10-09 DIAGNOSIS — G4709 Other insomnia: Secondary | ICD-10-CM

## 2020-10-09 DIAGNOSIS — M170 Bilateral primary osteoarthritis of knee: Secondary | ICD-10-CM

## 2020-10-09 DIAGNOSIS — M7061 Trochanteric bursitis, right hip: Secondary | ICD-10-CM

## 2020-10-09 DIAGNOSIS — M0579 Rheumatoid arthritis with rheumatoid factor of multiple sites without organ or systems involvement: Secondary | ICD-10-CM

## 2020-10-09 DIAGNOSIS — Z9884 Bariatric surgery status: Secondary | ICD-10-CM

## 2020-10-09 DIAGNOSIS — M797 Fibromyalgia: Secondary | ICD-10-CM

## 2020-10-09 DIAGNOSIS — Z8679 Personal history of other diseases of the circulatory system: Secondary | ICD-10-CM

## 2020-10-09 DIAGNOSIS — Z8639 Personal history of other endocrine, nutritional and metabolic disease: Secondary | ICD-10-CM

## 2020-10-22 ENCOUNTER — Other Ambulatory Visit: Payer: Self-pay

## 2020-10-22 ENCOUNTER — Ambulatory Visit (HOSPITAL_COMMUNITY)
Admission: RE | Admit: 2020-10-22 | Discharge: 2020-10-22 | Disposition: A | Discharge status: Home / Self Care | Payer: BC Managed Care – PPO | Source: Ambulatory Visit | Attending: Rheumatology | Admitting: Rheumatology

## 2020-10-22 DIAGNOSIS — M0579 Rheumatoid arthritis with rheumatoid factor of multiple sites without organ or systems involvement: Secondary | ICD-10-CM | POA: Insufficient documentation

## 2020-10-22 DIAGNOSIS — Z79899 Other long term (current) drug therapy: Secondary | ICD-10-CM | POA: Insufficient documentation

## 2020-10-22 MED ORDER — ACETAMINOPHEN 325 MG PO TABS
ORAL_TABLET | ORAL | Status: AC
  Administered 2020-10-22: 650 mg via ORAL
  Filled 2020-10-22: qty 2

## 2020-10-22 MED ORDER — DIPHENHYDRAMINE HCL 25 MG PO CAPS
25.0000 mg | ORAL_CAPSULE | ORAL | Status: DC
  Administered 2020-10-22: 25 mg via ORAL

## 2020-10-22 MED ORDER — ACETAMINOPHEN 325 MG PO TABS: 650.0000 mg | ORAL_TABLET | ORAL | Status: DC

## 2020-10-22 MED ORDER — DIPHENHYDRAMINE HCL 25 MG PO CAPS
ORAL_CAPSULE | ORAL | Status: AC
  Filled 2020-10-22: qty 1

## 2020-10-22 MED ORDER — SODIUM CHLORIDE 0.9 % IV SOLN
1000.0000 mg | INTRAVENOUS | Status: DC
  Administered 2020-10-22: 1000 mg via INTRAVENOUS
  Filled 2020-10-22: qty 40

## 2020-10-23 ENCOUNTER — Ambulatory Visit (HOSPITAL_COMMUNITY): Payer: BC Managed Care – PPO

## 2020-10-24 ENCOUNTER — Telehealth: Payer: Self-pay | Admitting: Pharmacist

## 2020-10-24 NOTE — Telephone Encounter (Signed)
Received notification from [facility] that patient is due for updated infusion orders. Last saw Sherron Ales, PA-C, in December 2021.  She has appt on 10/09/20 but was no show. Next infusion is 11/19/20.  Will route to scheduling team to have appt so we can renew Orencia IV orders as well  Chesley Mires, PharmD, MPH Clinical Pharmacist (Rheumatology and Pulmonology)

## 2020-10-27 NOTE — Telephone Encounter (Signed)
LMOM for patient to call and schedule follow-up appointment.   °

## 2020-11-03 NOTE — Addendum Note (Signed)
Addended by: Murrell Redden on: 11/03/2020 11:50 AM   Modules accepted: Kipp Brood

## 2020-11-03 NOTE — Addendum Note (Signed)
Addended by: Murrell Redden on: 11/03/2020 11:37 AM   Modules accepted: Kipp Brood

## 2020-11-10 ENCOUNTER — Telehealth: Payer: Self-pay | Admitting: Physician Assistant

## 2020-11-10 NOTE — Telephone Encounter (Signed)
Last Visit: 05/13/2020 Next Visit: was due May 2022. Message sent to the front to schedule.   Current Dose per office note on 05/13/2020: not discussed.  Okay to refill Lidoderm patches and Flexeril?

## 2020-11-10 NOTE — Telephone Encounter (Signed)
Please schedule patient for a follow up visit. Patient was due May 2022. Thanks!  

## 2020-11-10 NOTE — Telephone Encounter (Signed)
Called patient to schedule her follow-up appointment.  Patient states she lost her son last year and has been very depressed the last couple of months.  Patient states she is aware that she needs to reschedule, but "is not in a good place at this time."  Patient states she has family support and is in a support group, but the month of June is very difficult for her because her son's birthday was 6[birth year]22.  Patient states she will call back in July to schedule.

## 2020-11-11 ENCOUNTER — Other Ambulatory Visit: Payer: Self-pay | Admitting: Pharmacist

## 2020-11-11 DIAGNOSIS — M0579 Rheumatoid arthritis with rheumatoid factor of multiple sites without organ or systems involvement: Secondary | ICD-10-CM

## 2020-11-11 DIAGNOSIS — Z79899 Other long term (current) drug therapy: Secondary | ICD-10-CM

## 2020-11-11 NOTE — Progress Notes (Signed)
Next infusion scheduled for Orencia IV on 11/19/20 and due for updated orders. Diagnosis: rheumatoid arthritis  Dose: 1000mg  every 28 days (appropriate based on last recorded weight of 118.8kg)  Last Clinic Visit: 05/13/20 Next Clinic Visit: not yet scheduled, will schedule for July 2022/August 2022  Last infusion: 10/22/20 Labs: CBC and CMP on 09/25/20 were stable TB Gold: negative on 08/06/20   Orders placed for Orencia IV 1000mg  x 2 doses along with premedication of Tylenol and Benadryl to be administered 30 minutes before medication infusion. Patient will need f/u appointment.  Standing CBC with diff/platelet and CMP with GFR orders placed to be drawn every 2 months.  Next TB gold due March 2023  , PharmD, MPH Clinical Pharmacist (Rheumatology and Pulmonology)

## 2020-11-19 ENCOUNTER — Other Ambulatory Visit: Payer: Self-pay

## 2020-11-19 ENCOUNTER — Ambulatory Visit (HOSPITAL_COMMUNITY)
Admission: RE | Admit: 2020-11-19 | Discharge: 2020-11-19 | Disposition: A | Discharge status: Home / Self Care | Payer: BC Managed Care – PPO | Source: Ambulatory Visit | Attending: Rheumatology | Admitting: Rheumatology

## 2020-11-19 DIAGNOSIS — Z79899 Other long term (current) drug therapy: Secondary | ICD-10-CM | POA: Insufficient documentation

## 2020-11-19 DIAGNOSIS — M0579 Rheumatoid arthritis with rheumatoid factor of multiple sites without organ or systems involvement: Secondary | ICD-10-CM | POA: Diagnosis not present

## 2020-11-19 LAB — CBC WITH DIFFERENTIAL/PLATELET
Abs Immature Granulocytes: 0.06 10*3/uL (ref 0.00–0.07)
Basophils Absolute: 0.1 10*3/uL (ref 0.0–0.1)
Basophils Relative: 1 %
Eosinophils Absolute: 0.2 10*3/uL (ref 0.0–0.5)
Eosinophils Relative: 2 %
HCT: 44 % (ref 36.0–46.0)
Hemoglobin: 13.7 g/dL (ref 12.0–15.0)
Immature Granulocytes: 1 %
Lymphocytes Relative: 49 %
Lymphs Abs: 4.2 10*3/uL — ABNORMAL HIGH (ref 0.7–4.0)
MCH: 26.8 pg (ref 26.0–34.0)
MCHC: 31.1 g/dL (ref 30.0–36.0)
MCV: 85.9 fL (ref 80.0–100.0)
Monocytes Absolute: 0.6 10*3/uL (ref 0.1–1.0)
Monocytes Relative: 7 %
Neutro Abs: 3.4 10*3/uL (ref 1.7–7.7)
Neutrophils Relative %: 40 %
Platelets: 271 10*3/uL (ref 150–400)
RBC: 5.12 MIL/uL — ABNORMAL HIGH (ref 3.87–5.11)
RDW: 12.6 % (ref 11.5–15.5)
WBC: 8.5 10*3/uL (ref 4.0–10.5)
nRBC: 0 % (ref 0.0–0.2)

## 2020-11-19 LAB — COMPREHENSIVE METABOLIC PANEL
ALT: 17 U/L (ref 0–44)
AST: 23 U/L (ref 15–41)
Albumin: 3.3 g/dL — ABNORMAL LOW (ref 3.5–5.0)
Alkaline Phosphatase: 77 U/L (ref 38–126)
Anion gap: 6 (ref 5–15)
BUN: 11 mg/dL (ref 6–20)
CO2: 29 mmol/L (ref 22–32)
Calcium: 8.9 mg/dL (ref 8.9–10.3)
Chloride: 107 mmol/L (ref 98–111)
Creatinine, Ser: 0.88 mg/dL (ref 0.44–1.00)
GFR, Estimated: >60 mL/min (ref >60)
Glucose, Bld: 97 mg/dL (ref 70–99)
Potassium: 4.3 mmol/L (ref 3.5–5.1)
Sodium: 142 mmol/L (ref 135–145)
Total Bilirubin: 0.6 mg/dL (ref 0.3–1.2)
Total Protein: 6.5 g/dL (ref 6.5–8.1)

## 2020-11-19 MED ORDER — DIPHENHYDRAMINE HCL 25 MG PO CAPS
ORAL_CAPSULE | ORAL | Status: AC
  Filled 2020-11-19: qty 1

## 2020-11-19 MED ORDER — ACETAMINOPHEN 325 MG PO TABS
ORAL_TABLET | ORAL | Status: AC
  Filled 2020-11-19: qty 2

## 2020-11-19 MED ORDER — ACETAMINOPHEN 325 MG PO TABS
650.0000 mg | ORAL_TABLET | ORAL | Status: DC
  Administered 2020-11-19: 650 mg via ORAL

## 2020-11-19 MED ORDER — DIPHENHYDRAMINE HCL 25 MG PO CAPS
25.0000 mg | ORAL_CAPSULE | ORAL | Status: DC
  Administered 2020-11-19: 25 mg via ORAL

## 2020-11-19 MED ORDER — SODIUM CHLORIDE 0.9 % IV SOLN
1000.0000 mg | INTRAVENOUS | Status: DC
  Administered 2020-11-19: 1000 mg via INTRAVENOUS
  Filled 2020-11-19: qty 40

## 2020-11-19 NOTE — Progress Notes (Signed)
CBC and CMP are stable.

## 2020-12-08 ENCOUNTER — Other Ambulatory Visit: Payer: Self-pay | Admitting: Physician Assistant

## 2020-12-11 ENCOUNTER — Ambulatory Visit
Admission: EM | Admit: 2020-12-11 | Discharge: 2020-12-11 | Disposition: A | Discharge status: Home / Self Care | Payer: BC Managed Care – PPO | Attending: Physician Assistant | Admitting: Physician Assistant

## 2020-12-11 ENCOUNTER — Other Ambulatory Visit: Payer: Self-pay

## 2020-12-11 DIAGNOSIS — R0981 Nasal congestion: Secondary | ICD-10-CM

## 2020-12-11 DIAGNOSIS — D849 Immunodeficiency, unspecified: Secondary | ICD-10-CM | POA: Diagnosis not present

## 2020-12-11 NOTE — ED Provider Notes (Signed)
[facility]    CSN: [id number] Arrival date & time: 12/11/20  1958      History   Chief Complaint No chief complaint on file.   HPI [name] [name] is a 55 y.o. female.   Patient presents today with a several day history of nasal congestion.  She believes this is related to her seasonal allergies, however, she has known exposure to COVID-19 and is interested in being tested to ensure that is not the cause of her symptoms.  She denies additional symptoms including fever, headache, dizziness, chest pain, shortness of breath, body aches.  She does report increased stressors as her son died last year.  She is up-to-date on COVID-19 vaccinations.  Denies any recent antibiotic use.  She does have a history of allergies and is taking antihistamines and nasal sprays as prescribed.  Denies history of asthma or COPD.  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  She is able to do activities despite symptoms.   Past Medical History:  Diagnosis Date   Anxiety    Bursitis    Coronary artery calcification seen on CAT scan    minimal CAD with 30% prox and mild LAD   Depression    Diastolic dysfunction    Dyslipidemia 01/30/2015   Esophageal ring    Fibromyalgia    Hiatal hernia    HSV-1 infection    Morbid obesity (HCC)    Osteoarthritis    Rheumatoid arthritis(714.0)    M05.79   Rheumatoid arthritis, seropositive, 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HCC)    Treated with Orencia, TB neg 09/12/2015   Spondylolysis     Patient Active Problem List   Diagnosis Date Noted   Amenorrhea 06/09/2020   Anxiety 02/20/2018   Fatigue 02/20/2018   Low blood potassium 02/20/2018   Allergic rhinitis 12/14/2017   Fever blister 12/14/2017   Head congestion 12/14/2017   Hyperlipidemia 12/14/2017   Lateral epicondylitis of right elbow 05/04/2017   Sacral pain 03/23/2017   High risk medication use 08/03/2016   Fibromyalgia syndrome 08/03/2016   Abnormal cardiac CT  angiography    Sacrococcygeal pain 02/20/2016   Rheumatoid arthritis involving 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HCC) 01/24/2016   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   Dyslipidemia 01/30/2015   MDD (major depressive disorder), recurrent severe, 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   Essential hypertension 08/25/2014   Morbid obesity (HCC) 08/25/2014   CAD (coronary artery disease), native coronary artery 08/24/2014   Numbness and tingling in left arm    Arm numbness left 08/22/2014   Chest pain 08/22/2014   Gastroesophageal reflux disease without esophagitis 07/23/2014   Coronary artery calcification seen on CAT scan 02/19/2014   Hip pain 10/23/2013   Solitary pulmonary nodule 08/28/2013   Chest pain, atypical 07/18/2013   Heart palpitations 06/27/2013   Cough 06/04/2013   Diastolic dysfunction 05/18/2013   SOB (shortness of breath) 05/09/2013   Other malaise and fatigue 01/21/2013   Stress and adjustment reaction 12/09/2012   Right sided abdominal pain 11/29/2012   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/21/2012   Morbid obesity with BMI of 50.0-59.9, adult (HCC) 11/21/2012   Arthritis 07/13/2012   Depression 07/13/2012   Routine health maintenance 05/07/2012   Migraine 06/07/1998    Past Surgical History:  Procedure Laterality Date   CARDIAC CATHETERIZATION N/A 06/11/2016   Procedure: Left Heart Cath and Coronary Angiography;  Surgeon: Corky Crafts, MD;  Location: [facility] INVASIVE CV LAB;  Service: Cardiovascular;  Laterality: N/A;   CESAREAN SECTION  COLONOSCOPY  07/27/2017   ESOPHAGEAL MANOMETRY N/A 10/28/2014   Procedure: ESOPHAGEAL MANOMETRY (EM);  Surgeon: Hilarie Fredrickson, MD;  Location: [facility] ENDOSCOPY;  Service: Endoscopy;  Laterality: N/A;   HERNIA REPAIR     reports surgery on 3 hernias, with 2 more present   LAPAROSCOPIC GASTRIC SLEEVE RESECTION  11/21/12   [facility]   LEFT HEART CATHETERIZATION WITH CORONARY ANGIOGRAM N/A 08/26/2014   Procedure: LEFT HEART CATHETERIZATION  WITH CORONARY ANGIOGRAM;  Surgeon: Runell Gess, MD;  Location: [facility] CATH LAB;  Service: Cardiovascular;  Laterality: N/A;   TUBAL LIGATION      OB History   No obstetric history on file.      Home Medications    Prior to Admission medications   Medication Sig Start Date End Date Taking? Authorizing Provider  Abatacept (ORENCIA IV) Inject 1,000 mg into the vein every 28 (twenty-eight) days.    Pollyann Savoy, MD  Acetaminophen-Codeine 300-30 MG tablet TAKE 1-2 TABLETS BY MOUTH EVERY 8 HOURS AS NEEDED. 06/11/20   Kathryne Hitch, MD  Adapalene 0.3 % gel Apply 1 application topically daily as needed (acne).  12/31/14   [provider]  Alcohol Swabs (ALCOHOL WIPES) 70 % PADS Alcohol Prep Pads    [provider]  ARIPiprazole (ABILIFY) 2 MG tablet Take 1 tablet (2 mg total) by mouth daily. 04/22/15   Benjaman Pott, MD  atorvastatin (LIPITOR) 80 MG tablet Add'l Sig Add'l Sig oral Add'l Sig 10/10/19   [provider]  azelastine (ASTELIN) 0.1 % nasal spray Place 2 sprays into both nostrils 2 (two) times daily. 01/12/20   Cathie Hoops, Amy V, PA-C  benzonatate (TESSALON) 100 MG capsule Take 1 capsule (100 mg total) by mouth every 8 (eight) hours. 02/06/20   Hall-Potvin, [place], PA-C  buPROPion (WELLBUTRIN XL) 150 MG 24 hr tablet Take 450 mg by mouth every morning. 05/23/19   [provider]  buPROPion (WELLBUTRIN XL) 300 MG 24 hr tablet Take 1 tablet (300 mg total) by mouth daily. 12/28/14   Adonis Brook, NP  cetirizine (ZYRTEC ALLERGY) 10 MG tablet Take 1 tablet (10 mg total) by mouth daily. 02/06/20   Hall-Potvin, [place], PA-C  cetirizine-pseudoephedrine (ZYRTEC-D ALLERGY & CONGESTION) 5-120 MG tablet Zyrtec-D 5 mg-120 mg tablet,extended release  TAKE 1 TABLET BY MOUTH EVERY 12 HOURS FOR 10 DAYS    [provider]  clindamycin-benzoyl peroxide (BENZACLIN) gel Apply 1 application topically daily as needed (acne).  02/13/15   [provider]   clobetasol cream (TEMOVATE) 0.05 % Apply 1 application topically daily as needed (skin discoloration).  04/22/15   [provider]  cyclobenzaprine (FLEXERIL) 10 MG tablet TAKE ONE TABLET BY MOUTH EVERY NIGHT AT BEDTIME AS NEEDED FOR MUSCLE SPASMS 11/10/20   Gearldine Bienenstock, PA-C  diazepam (VALIUM) 5 MG tablet Take 5 mg by mouth at bedtime. 05/05/20   [provider]  diclofenac Sodium (VOLTAREN) 1 % GEL Apply 2 grams to 4 grams to the affected area up to 4 times daily as needed. 09/05/20   Gearldine Bienenstock, PA-C  diltiazem (CARDIZEM CD) 180 MG 24 hr capsule Take 1 capsule (180 mg total) by mouth daily. 06/09/20   Manson Passey, PA  diltiazem (TIAZAC) 180 MG 24 hr capsule Take by mouth. 08/27/13   [provider]  doxepin (SINEQUAN) 25 MG capsule Take 25 mg by mouth at bedtime as needed. 04/28/19   [provider]  EPINEPHrine 0.3 mg/0.3 mL IJ SOAJ injection epinephrine  0.3 mg/0.3 mL injection, auto-injector 07/02/19   [provider]  ezetimibe (ZETIA) 10 MG tablet Take 10 mg by mouth daily. 10/22/19   [provider]  FABB 2.2-25-1 MG TABS TAKE 2 TABLETS BY MOUTH EVERY DAY 08/18/20   Gearldine Bienenstock, PA-C  famciclovir (FAMVIR) 500 MG tablet Take 15,000 mg by mouth daily as needed (fever blister).  01/21/15   [provider]  fluticasone (FLONASE) 50 MCG/ACT nasal spray Place 1 spray into both nostrils daily. 02/06/20   Hall-Potvin, [place], PA-C  gabapentin (NEURONTIN) 300 MG capsule TAKE 1 CAPSULE BY MOUTH 3 TIMES A DAY AS NEEDED FOR PAIN 08/18/20   Gearldine Bienenstock, PA-C  hydrocortisone 2.5 % cream SMARTSIG:1 Topical Every Night 11/06/19   [provider]  ibuprofen (ADVIL) 800 MG tablet ibuprofen 800 mg tablet  TAKE 1 TABLET BY MOUTH THREE TIMES A DAY AS NEEDED    [provider]  ketoconazole (NIZORAL) 2 % cream  03/14/20   [provider]  ketoconazole (NIZORAL) 2 % shampoo  06/03/20   [provider]   levothyroxine (SYNTHROID) 25 MCG tablet levothyroxine 25 mcg tablet  TAKE 1 TABLET IN THE MORNING ON AN EMPTY STOMACH 30 MINUTES BEFORE FOOD    [provider]  lidocaine (LIDODERM) 5 % APPLY 1 PATCH TO AFFECTED AREA FOR 12 HOURS IN A 24 HOUR PERIOD 11/10/20   Gearldine Bienenstock, PA-C  LORazepam (ATIVAN) 1 MG tablet Take 1 tablet (1 mg total) by mouth 3 (three) times daily as needed for anxiety. 10/09/19   Moshe Cipro, NP  Methotrexate 10 MG/0.4ML SOSY 1 tab    [provider]  Methotrexate, PF, (RASUVO) 25 MG/0.5ML SOAJ INJECT ONE PEN SUBCUTANEOUSLY ONCE EVERY WEEK. STORE AT ROOM TEMPERATURE BETWEEN 68 - 77 DEGREES F. 05/13/20   Gearldine Bienenstock, PA-C  nitroGLYCERIN (NITROSTAT) 0.4 MG SL tablet Place 1 tablet (0.4 mg total) under the tongue every 5 (five) minutes as needed for chest pain. 05/01/19   Rosalio Macadamia, NP  [facility] powder Apply topically. 03/17/20   [provider]  Oxymetazoline HCl (NASAL SPRAY) 0.05 % SOLN Nasal Decongestant (oxymetazoline) 0.05 % spray    [provider]  propranolol (INDERAL) 10 MG tablet Take 10 mg by mouth daily.    [provider]  rosuvastatin (CRESTOR) 20 MG tablet Take 20 mg by mouth. 09/21/16 05/13/20  [provider]  tretinoin (RETIN-A) 0.025 % cream Apply 1 application topically daily as needed (acne).  02/11/15   [provider]  triamcinolone cream (KENALOG) 0.1 % Apply 1 application topically daily as needed (rash). Itching  04/13/15   [provider]  Vitamin D, Ergocalciferol, (DRISDOL) 1.25 MG (50000 UT) CAPS capsule Take 50,000 Units by mouth See admin instructions. Take 1 tablet (50000 units) by mouth every Monday, Wednesday, Saturday    [provider]  XERESE 5-1 % CREA Apply 1 application topically daily as needed (fever blisters).  12/30/14   [provider]    Family History Family History  Problem Relation Age of Onset   Hyperlipidemia Mother     Depression Mother    Sarcoidosis Father    Lung disease Father        Pleural Mesothelioma   Cancer Father    Heart attack Paternal Grandmother    Hypertension Paternal Grandmother    Hypertension Maternal Grandmother    Diabetes Maternal Grandmother    Stroke Neg Hx    Colon cancer Neg Hx  Esophageal cancer Neg Hx    Rectal cancer Neg Hx    Stomach cancer Neg Hx     Social History Social History   Tobacco Use   Smoking status: Never   Smokeless tobacco: Never  Vaping Use   Vaping Use: Never used  Substance Use Topics   Alcohol use: No   Drug use: No     Allergies   Influenza vaccines, Amitriptyline, and Isosorbide   Review of Systems Review of Systems  Constitutional:  Positive for activity change. Negative for appetite change, fatigue and fever.  HENT:  Positive for congestion. Negative for sinus pressure, sneezing and sore throat.   Respiratory:  Negative for cough and shortness of breath.   Cardiovascular:  Negative for chest pain.  Gastrointestinal:  Negative for abdominal pain, diarrhea, nausea and vomiting.  Neurological:  Negative for dizziness, light-headedness and headaches.    Physical Exam Triage Vital Signs ED Triage Vitals  Enc Vitals Group     BP 12/11/20 2012 (!) 139/91     Pulse Rate 12/11/20 2012 99     Resp 12/11/20 2012 16     Temp 12/11/20 2012 98.5 F (36.9 C)     Temp Source 12/11/20 2012 Oral     SpO2 12/11/20 2012 96 %     Weight --      Height --      Head Circumference --      Peak Flow --      Pain Score 12/11/20 2009 0     Pain Loc --      Pain Edu? --      Excl. in GC? --    No data found.  Updated Vital Signs BP (!) 139/91 (BP Location: Right Arm)   Pulse 99   Temp 98.5 F (36.9 C) (Oral)   Resp 16   SpO2 96%   Visual Acuity Right Eye Distance:   Left Eye Distance:   Bilateral Distance:    Right Eye Near:   Left Eye Near:    Bilateral Near:     Physical Exam Vitals reviewed.  Constitutional:       General: She is awake. She is not in acute distress.    Appearance: Normal appearance. She is normal weight. She is not ill-appearing.     Comments: Very pleasant female appears stated age in no acute distress  HENT:     Head: Normocephalic and atraumatic.     Right Ear: External ear normal.     Left Ear: External ear normal.     Nose:     Right Sinus: No maxillary sinus tenderness or frontal sinus tenderness.     Left Sinus: No maxillary sinus tenderness or frontal sinus tenderness.     Mouth/Throat:     Pharynx: Uvula midline. No oropharyngeal exudate or posterior oropharyngeal erythema.  Cardiovascular:     Rate and Rhythm: Normal rate and regular rhythm.     Heart sounds: Normal heart sounds, S1 normal and S2 normal. No murmur heard. Pulmonary:     Effort: Pulmonary effort is normal.     Breath sounds: Normal breath sounds. No wheezing, rhonchi or rales.     Comments: Clear to auscultation bilaterally Psychiatric:        Behavior: Behavior is cooperative.     UC Treatments / Results  Labs (all labs ordered are listed, but only abnormal results are displayed) Labs Reviewed  NOVEL CORONAVIRUS, NAA    EKG   Radiology No results found.  Procedures Procedures (including critical care time)  Medications Ordered in UC Medications - No data to display  Initial Impression / Assessment and Plan / UC Course  I have reviewed the triage vital signs and the nursing notes.  Pertinent labs & imaging results that were available during my care of the patient were reviewed by me and considered in my medical decision making (see chart for details).      Vital signs and physical exam reassuring today; no indication for emergent evaluation or imaging.  COVID-19 test is pending.  Discussed that symptoms are likely related to allergies versus URI given clinical presentation.  Encouraged her to continue previously prescribed medication.  Discussed alarm symptoms that warrant emergent  evaluation.  Strict return precautions given to which patient expressed understanding.  Final Clinical Impressions(s) / UC Diagnoses   Final diagnoses:  Nasal congestion  Immunosuppressed status [facility])     Discharge Instructions      We will contact you if your COVID-19 test is positive.  Please continue your over-the-counter allergy medication as well as all prescribed medicines.  If you develop any worsening symptoms you need to be reevaluated immediately.     ED Prescriptions   None    PDMP not reviewed this encounter.   Jeani Hawking, PA-C 12/11/20 2044

## 2020-12-11 NOTE — Discharge Instructions (Addendum)
We will contact you if your COVID-19 test is positive.  Please continue your over-the-counter allergy medication as well as all prescribed medicines.  If you develop any worsening symptoms you need to be reevaluated immediately.

## 2020-12-11 NOTE — ED Triage Notes (Signed)
Patient presents to Urgent Care with complaints of runny nose. Pt states exposed to covid wants to be tested. Has a hx of allergies.   Denies fever.

## 2020-12-13 LAB — SARS-COV-2, NAA 2 DAY TAT

## 2020-12-13 LAB — NOVEL CORONAVIRUS, NAA: SARS-CoV-2, NAA: NOT DETECTED

## 2020-12-16 ENCOUNTER — Other Ambulatory Visit: Payer: Self-pay

## 2020-12-16 ENCOUNTER — Emergency Department (HOSPITAL_BASED_OUTPATIENT_CLINIC_OR_DEPARTMENT_OTHER): Payer: BC Managed Care – PPO

## 2020-12-16 ENCOUNTER — Emergency Department (HOSPITAL_BASED_OUTPATIENT_CLINIC_OR_DEPARTMENT_OTHER)
Admission: EM | Admit: 2020-12-16 | Discharge: 2020-12-16 | Disposition: A | Discharge status: Home / Self Care | Payer: BC Managed Care – PPO | Attending: Emergency Medicine | Admitting: Emergency Medicine

## 2020-12-16 ENCOUNTER — Encounter (HOSPITAL_BASED_OUTPATIENT_CLINIC_OR_DEPARTMENT_OTHER): Payer: Self-pay | Admitting: *Deleted

## 2020-12-16 DIAGNOSIS — R197 Diarrhea, unspecified: Secondary | ICD-10-CM | POA: Diagnosis not present

## 2020-12-16 DIAGNOSIS — Z79899 Other long term (current) drug therapy: Secondary | ICD-10-CM | POA: Diagnosis not present

## 2020-12-16 DIAGNOSIS — R0789 Other chest pain: Secondary | ICD-10-CM

## 2020-12-16 DIAGNOSIS — R202 Paresthesia of skin: Secondary | ICD-10-CM | POA: Insufficient documentation

## 2020-12-16 LAB — BASIC METABOLIC PANEL
Anion gap: 8 (ref 5–15)
BUN: 13 mg/dL (ref 6–20)
CO2: 26 mmol/L (ref 22–32)
Calcium: 9.1 mg/dL (ref 8.9–10.3)
Chloride: 107 mmol/L (ref 98–111)
Creatinine, Ser: 0.81 mg/dL (ref 0.44–1.00)
GFR, Estimated: >60 mL/min (ref >60)
Glucose, Bld: 88 mg/dL (ref 70–99)
Potassium: 3.8 mmol/L (ref 3.5–5.1)
Sodium: 141 mmol/L (ref 135–145)

## 2020-12-16 LAB — TROPONIN I (HIGH SENSITIVITY): Troponin I (High Sensitivity): 3 ng/L (ref <18)

## 2020-12-16 LAB — CBC
HCT: 47.2 % — ABNORMAL HIGH (ref 36.0–46.0)
Hemoglobin: 15.1 g/dL — ABNORMAL HIGH (ref 12.0–15.0)
MCH: 26.8 pg (ref 26.0–34.0)
MCHC: 32 g/dL (ref 30.0–36.0)
MCV: 83.8 fL (ref 80.0–100.0)
Platelets: 269 10*3/uL (ref 150–400)
RBC: 5.63 MIL/uL — ABNORMAL HIGH (ref 3.87–5.11)
RDW: 12.9 % (ref 11.5–15.5)
WBC: 10.2 10*3/uL (ref 4.0–10.5)
nRBC: 0 % (ref 0.0–0.2)

## 2020-12-16 MED ORDER — ACETAMINOPHEN 500 MG PO TABS
1000.0000 mg | ORAL_TABLET | Freq: Once | ORAL | Status: AC
  Administered 2020-12-16: 1000 mg via ORAL
  Filled 2020-12-16: qty 2

## 2020-12-16 NOTE — ED Provider Notes (Signed)
[facility] Provider Note: [name] Dell, MD, FACEP  CSN: [id number] MRN: [id number] ARRIVAL: 12/16/20 at 1937 ROOM: MH12/MH12   CHIEF COMPLAINT  Chest Pain   HISTORY OF PRESENT ILLNESS  12/16/20 11:21 PM [name] [name] is a 55 y.o. female who has had chest discomfort since this morning.  She describes it as a tightness and localizes it to her anterior chest.  Although she described it as tightness to me she described it is also having throbbing and sharp components in triage.  She also has had a headache which she states is "pretty bad" but has not taken anything for it.  She also had some tingling of the lips earlier but acknowledges she was breathing rapidly at the time.  He rates her chest pain as a 7 out of 10.  Nothing makes it better or worse.  There has been no associated diaphoresis, shortness of breath, nausea or vomiting.  She did have some diarrhea earlier today.   Past Medical History:  Diagnosis Date   Anxiety    Bursitis    Coronary artery calcification seen on CAT scan    minimal CAD with 30% prox and mild LAD   Depression    Diastolic dysfunction    Dyslipidemia 01/30/2015   Esophageal ring    Fibromyalgia    Hiatal hernia    HSV-1 infection    Morbid obesity (HCC)    Osteoarthritis    Rheumatoid arthritis(714.0)    M05.79   Rheumatoid arthritis, seropositive, 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HCC)    Treated with Orencia, TB neg 09/12/2015   Spondylolysis     Past Surgical History:  Procedure Laterality Date   CARDIAC CATHETERIZATION N/A 06/11/2016   Procedure: Left Heart Cath and Coronary Angiography;  Surgeon: Corky Crafts, MD;  Location: [facility] INVASIVE CV LAB;  Service: Cardiovascular;  Laterality: N/A;   CESAREAN SECTION     COLONOSCOPY  07/27/2017   ESOPHAGEAL MANOMETRY N/A 10/28/2014   Procedure: ESOPHAGEAL MANOMETRY (EM);  Surgeon: Hilarie Fredrickson, MD;  Location: [facility] ENDOSCOPY;  Service: Endoscopy;  Laterality: N/A;   HERNIA REPAIR     reports  surgery on 3 hernias, with 2 more present   LAPAROSCOPIC GASTRIC SLEEVE RESECTION  11/21/12   [facility]   LEFT HEART CATHETERIZATION WITH CORONARY ANGIOGRAM N/A 08/26/2014   Procedure: LEFT HEART CATHETERIZATION WITH CORONARY ANGIOGRAM;  Surgeon: Runell Gess, MD;  Location: [facility] CATH LAB;  Service: Cardiovascular;  Laterality: N/A;   TUBAL LIGATION      Family History  Problem Relation Age of Onset   Hyperlipidemia Mother    Depression Mother    Sarcoidosis Father    Lung disease Father        Pleural Mesothelioma   Cancer Father    Heart attack Paternal Grandmother    Hypertension Paternal Grandmother    Hypertension Maternal Grandmother    Diabetes Maternal Grandmother    Stroke Neg Hx    Colon cancer Neg Hx    Esophageal cancer Neg Hx    Rectal cancer Neg Hx    Stomach cancer Neg Hx     Social History   Tobacco Use   Smoking status: Never   Smokeless tobacco: Never  Vaping Use   Vaping Use: Never used  Substance Use Topics   Alcohol use: No   Drug use: No    Prior to Admission medications   Medication Sig Start Date End Date Taking? Authorizing Provider  Abatacept (ORENCIA IV) Inject 1,000  mg into the vein every 28 (twenty-eight) days.    Pollyann Savoy, MD  Acetaminophen-Codeine 300-30 MG tablet TAKE 1-2 TABLETS BY MOUTH EVERY 8 HOURS AS NEEDED. 06/11/20   Kathryne Hitch, MD  Adapalene 0.3 % gel Apply 1 application topically daily as needed (acne).  12/31/14   [provider]  Alcohol Swabs (ALCOHOL WIPES) 70 % PADS Alcohol Prep Pads    [provider]  atorvastatin (LIPITOR) 80 MG tablet Add'l Sig Add'l Sig oral Add'l Sig 10/10/19   [provider]  azelastine (ASTELIN) 0.1 % nasal spray Place 2 sprays into both nostrils 2 (two) times daily. 01/12/20   Cathie Hoops, Amy V, PA-C  benzonatate (TESSALON) 100 MG capsule Take 1 capsule (100 mg total) by mouth every 8 (eight) hours. 02/06/20   Hall-Potvin, [place], PA-C  buPROPion (WELLBUTRIN XL)  150 MG 24 hr tablet Take 450 mg by mouth every morning. 05/23/19   [provider]  cetirizine (ZYRTEC ALLERGY) 10 MG tablet Take 1 tablet (10 mg total) by mouth daily. 02/06/20   Hall-Potvin, [place], PA-C  cyclobenzaprine (FLEXERIL) 10 MG tablet TAKE ONE TABLET BY MOUTH EVERY NIGHT AT BEDTIME AS NEEDED FOR MUSCLE SPASMS 11/10/20   Gearldine Bienenstock, PA-C  diazepam (VALIUM) 5 MG tablet Take 5 mg by mouth at bedtime. 05/05/20   [provider]  diclofenac Sodium (VOLTAREN) 1 % GEL Apply 2 grams to 4 grams to the affected area up to 4 times daily as needed. 09/05/20   Gearldine Bienenstock, PA-C  diltiazem (CARDIZEM CD) 180 MG 24 hr capsule Take 1 capsule (180 mg total) by mouth daily. 06/09/20   Bhagat, Sharrell Ku, PA  doxepin (SINEQUAN) 25 MG capsule Take 25 mg by mouth at bedtime as needed. 04/28/19   [provider]  EPINEPHrine 0.3 mg/0.3 mL IJ SOAJ injection epinephrine 0.3 mg/0.3 mL injection, auto-injector 07/02/19   [provider]  ezetimibe (ZETIA) 10 MG tablet Take 10 mg by mouth daily. 10/22/19   [provider]  FABB 2.2-25-1 MG TABS TAKE 2 TABLETS BY MOUTH EVERY DAY 08/18/20   Gearldine Bienenstock, PA-C  famciclovir (FAMVIR) 500 MG tablet Take 15,000 mg by mouth daily as needed (fever blister).  01/21/15   [provider]  fluticasone (FLONASE) 50 MCG/ACT nasal spray Place 1 spray into both nostrils daily. 02/06/20   Hall-Potvin, [place], PA-C  gabapentin (NEURONTIN) 300 MG capsule TAKE 1 CAPSULE BY MOUTH 3 TIMES A DAY AS NEEDED FOR PAIN 08/18/20   Gearldine Bienenstock, PA-C  hydrocortisone 2.5 % cream SMARTSIG:1 Topical Every Night 11/06/19   [provider]  ibuprofen (ADVIL) 800 MG tablet ibuprofen 800 mg tablet  TAKE 1 TABLET BY MOUTH THREE TIMES A DAY AS NEEDED    [provider]  ketoconazole (NIZORAL) 2 % cream  03/14/20   [provider]  ketoconazole (NIZORAL) 2 % shampoo  06/03/20   [provider]  levothyroxine  (SYNTHROID) 25 MCG tablet levothyroxine 25 mcg tablet  TAKE 1 TABLET IN THE MORNING ON AN EMPTY STOMACH 30 MINUTES BEFORE FOOD    [provider]  lidocaine (LIDODERM) 5 % APPLY 1 PATCH TO AFFECTED AREA FOR 12 HOURS IN A 24 HOUR PERIOD 11/10/20   Gearldine Bienenstock, PA-C  LORazepam (ATIVAN) 1 MG tablet Take 1 tablet (1 mg total) by mouth 3 (three) times daily as needed for anxiety. 10/09/19   Moshe Cipro, NP  Methotrexate 10 MG/0.4ML SOSY 1 tab    [provider]  Methotrexate, PF, (RASUVO) 25 MG/0.5ML SOAJ INJECT ONE PEN SUBCUTANEOUSLY ONCE EVERY WEEK. STORE AT ROOM TEMPERATURE BETWEEN 68 - 77 DEGREES F. 05/13/20   Gearldine Bienenstock, PA-C  nitroGLYCERIN (NITROSTAT) 0.4 MG SL tablet Place 1 tablet (0.4 mg total) under the tongue every 5 (five) minutes as needed for chest pain. 05/01/19   Rosalio Macadamia, NP  [facility] powder Apply topically. 03/17/20   [provider]  Oxymetazoline HCl (NASAL SPRAY) 0.05 % SOLN Nasal Decongestant (oxymetazoline) 0.05 % spray    [provider]  propranolol (INDERAL) 10 MG tablet Take 10 mg by mouth daily.    [provider]  rosuvastatin (CRESTOR) 20 MG tablet Take 20 mg by mouth. 09/21/16 05/13/20  [provider]  Vitamin D, Ergocalciferol, (DRISDOL) 1.25 MG (50000 UT) CAPS capsule Take 50,000 Units by mouth See admin instructions. Take 1 tablet (50000 units) by mouth every Monday, Wednesday, Saturday    [provider]    Allergies Influenza vaccines, Amitriptyline, and Isosorbide   REVIEW OF SYSTEMS  Negative except as noted here or in the History of Present Illness.   PHYSICAL EXAMINATION  Initial Vital Signs Blood pressure 121/87, pulse 83, temperature 98.2 F (36.8 C), temperature source Oral, resp. rate 20, SpO2 99 %.  Examination General: Well-developed, well-nourished female in no acute distress; appearance consistent with age of record HENT: normocephalic; atraumatic Eyes: pupils equal,  round and reactive to light; extraocular muscles intact Neck: supple Heart: regular rate and rhythm; no murmurs, rubs or gallops Lungs: clear to auscultation bilaterally Abdomen: soft; nondistended; nontender; no masses or hepatosplenomegaly; bowel sounds present Extremities: No deformity; full range of motion; pulses normal Neurologic: Awake, alert and oriented; motor function intact in all extremities and symmetric; no facial droop Skin: Warm and dry Psychiatric: Normal mood and affect   RESULTS  Summary of this visit's results, reviewed and interpreted by myself:   EKG Interpretation  Date/Time:  Tuesday December 16 2020 19:47:30 EDT Ventricular Rate:  90 PR Interval:  128 QRS Duration: 78 QT Interval:  354 QTC Calculation: 433 R Axis:   171 Text Interpretation: Normal sinus rhythm Right axis deviation Possible Right ventricular hypertrophy Abnormal ECG No significant change was found Confirmed by Amareon Phung, Jonny Ruiz ([id number]) on 12/16/2020 11:22:41 PM        Laboratory Studies: Results for orders placed or performed during the hospital encounter of 12/16/20 (from the past 24 hour(s))  Basic metabolic panel     Status: None   Collection Time: 12/16/20  8:37 PM  Result Value Ref Range   Sodium 141 135 - 145 mmol/L   Potassium 3.8 3.5 - 5.1 mmol/L   Chloride 107 98 - 111 mmol/L   CO2 26 22 - 32 mmol/L   Glucose, Bld 88 70 - 99 mg/dL   BUN 13 6 - 20 mg/dL   Creatinine, Ser 6.97 0.44 - 1.00 mg/dL   Calcium 9.1 8.9 - 94.8 mg/dL   GFR, Estimated >01 >65 mL/min   Anion gap 8 5 - 15  CBC     Status: Abnormal   Collection Time: 12/16/20  8:37 PM  Result Value Ref Range   WBC 10.2 4.0 - 10.5 K/uL   RBC 5.63 (H) 3.87 - 5.11 MIL/uL   Hemoglobin 15.1 (H) 12.0 - 15.0 g/dL   HCT 53.7 (H) 48.2 - 70.7 %   MCV 83.8 80.0 - 100.0 fL   MCH 26.8 26.0 - 34.0 pg   MCHC 32.0 30.0 - 36.0  g/dL   RDW 09.8 11.9 - 14.7 %   Platelets 269 150 - 400 K/uL   nRBC 0.0 0.0 - 0.2 %  Troponin I (High  Sensitivity)     Status: None   Collection Time: 12/16/20  8:37 PM  Result Value Ref Range   Troponin I (High Sensitivity) 3 <18 ng/L   Imaging Studies: DG Chest 2 View  Result Date: 12/16/2020 CLINICAL DATA:  Chest pain and hypertension today. EXAM: CHEST - 2 VIEW COMPARISON:  PA and lateral chest 09/03/2020. FINDINGS: Lung volumes are low but the lungs are clear. Heart size is normal. No pneumothorax or pleural fluid. No acute or focal bony abnormality. IMPRESSION: Negative chest. Electronically Signed   By: Drusilla Kanner M.D.   On: 12/16/2020 20:22    ED COURSE and MDM  Nursing notes, initial and subsequent vitals signs, including pulse oximetry, reviewed and interpreted by myself.  Vitals:   12/16/20 1944 12/16/20 2236  BP: 101/65 121/87  Pulse: 92 83  Resp: 16 20  Temp: 98.2 F (36.8 C)   TempSrc: Oral   SpO2: 97% 99%   Medications  acetaminophen (TYLENOL) tablet 1,000 mg (has no administration in time range)    HEART score is low.  The patient's pain is atypical for coronary etiology.  She was advised to return if symptoms worsen but otherwise will follow-up with her PCP.  PROCEDURES  Procedures   ED DIAGNOSES     ICD-10-CM   1. Atypical chest pain  R07.89          Paula Libra, MD 12/16/20 2337

## 2020-12-16 NOTE — ED Triage Notes (Signed)
Pt reports high blood pressure today (146/99)- mid chest pain since this morning. Headache and "lips tingling" also

## 2020-12-16 NOTE — ED Notes (Signed)
EDP at bedside  

## 2020-12-17 ENCOUNTER — Ambulatory Visit (HOSPITAL_COMMUNITY)
Admission: RE | Admit: 2020-12-17 | Discharge: 2020-12-17 | Disposition: A | Discharge status: Home / Self Care | Payer: BC Managed Care – PPO | Source: Ambulatory Visit | Attending: Rheumatology | Admitting: Rheumatology

## 2020-12-17 DIAGNOSIS — M0579 Rheumatoid arthritis with rheumatoid factor of multiple sites without organ or systems involvement: Secondary | ICD-10-CM | POA: Diagnosis not present

## 2020-12-17 MED ORDER — ACETAMINOPHEN 325 MG PO TABS
650.0000 mg | ORAL_TABLET | ORAL | Status: DC
Start: 2020-12-17 — End: 2020-12-18

## 2020-12-17 MED ORDER — SODIUM CHLORIDE 0.9 % IV SOLN
1000.0000 mg | INTRAVENOUS | Status: DC
  Administered 2020-12-17: 1000 mg via INTRAVENOUS
  Filled 2020-12-17: qty 40

## 2020-12-17 MED ORDER — ACETAMINOPHEN 325 MG PO TABS
ORAL_TABLET | ORAL | Status: AC
  Administered 2020-12-17: 650 mg via ORAL
  Filled 2020-12-17: qty 2

## 2020-12-17 MED ORDER — DIPHENHYDRAMINE HCL 25 MG PO CAPS
ORAL_CAPSULE | ORAL | Status: AC
  Administered 2020-12-17: 25 mg via ORAL
  Filled 2020-12-17: qty 1

## 2020-12-17 MED ORDER — DIPHENHYDRAMINE HCL 25 MG PO CAPS
25.0000 mg | ORAL_CAPSULE | ORAL | Status: DC
Start: 2020-12-17 — End: 2020-12-18

## 2020-12-23 ENCOUNTER — Other Ambulatory Visit: Payer: Self-pay | Admitting: Physician Assistant

## 2020-12-23 NOTE — Telephone Encounter (Signed)
Please schedule patient for a follow up visit. Patient was due May 2022. Thanks!  

## 2020-12-23 NOTE — Telephone Encounter (Signed)
Next Visit: was due May 2022. Message sent to the front to schedule   Last Visit: 05/13/2020   Last Fill:11/10/2020   Dx: Rheumatoid arthritis involving multiple sites with positive rheumatoid factor   Current Dose per office note on 05/13/2020, flexeril not mentioned   Okay to refill Flexeril?

## 2020-12-31 ENCOUNTER — Other Ambulatory Visit: Payer: Self-pay | Admitting: Physician Assistant

## 2020-12-31 ENCOUNTER — Ambulatory Visit: Payer: BC Managed Care – PPO | Admitting: Physician Assistant

## 2020-12-31 NOTE — Progress Notes (Deleted)
Office Visit Note  Patient: [name] [name]             Date of Birth: [birth year]           MRN: [id number]             PCP: [name], MD Referring: [name], MD Visit Date: 12/31/2020 Occupation: @GUAROCC @  Subjective:     History of Present Illness: CHERY GIUSTO is a 55 y.o. female with history of seropositive rheumatoid arthritis, osteoarthritis, and fibromyalgia.    Activities of Daily Living:  Patient reports morning stiffness for *** {minute/hour:19697}.   Patient {ACTIONS;DENIES/REPORTS:21021675::"Denies"} nocturnal pain.  Difficulty dressing/grooming: {ACTIONS;DENIES/REPORTS:21021675::"Denies"} Difficulty climbing stairs: {ACTIONS;DENIES/REPORTS:21021675::"Denies"} Difficulty getting out of chair: {ACTIONS;DENIES/REPORTS:21021675::"Denies"} Difficulty using hands for taps, buttons, cutlery, and/or writing: {ACTIONS;DENIES/REPORTS:21021675::"Denies"}  No Rheumatology ROS completed.   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   Dyslipidemia 01/30/2015   MDD (major depressive disorder), recurrent severe, 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   Essential hypertension 08/25/2014   Morbid obesity (HCC) 08/25/2014   CAD (coronary artery disease), native coronary artery 08/24/2014   Numbness and tingling in left arm    Arm numbness left 08/22/2014   Chest pain 08/22/2014   Gastroesophageal  reflux disease without esophagitis 07/23/2014   Coronary artery calcification seen on CAT scan 02/19/2014   Hip pain 10/23/2013   Solitary pulmonary nodule 08/28/2013   Chest pain, atypical 07/18/2013   Heart palpitations 06/27/2013   Cough 06/04/2013   Diastolic dysfunction 05/18/2013   SOB (shortness of breath) 05/09/2013   Other malaise and fatigue 01/21/2013   Stress and adjustment reaction 12/09/2012   Right sided abdominal pain 11/29/2012   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/21/2012   Morbid obesity with BMI of 50.0-59.9, adult (HCC) 11/21/2012   Arthritis 07/13/2012   Depression 07/13/2012   Routine health maintenance 05/07/2012   Migraine 06/07/1998    Past Medical History:  Diagnosis Date   Anxiety    Bursitis    Coronary artery calcification seen on CAT scan    minimal CAD with 30% prox and mild LAD   Depression    Diastolic dysfunction    Dyslipidemia 01/30/2015   Esophageal ring    Fibromyalgia    Hiatal hernia    HSV-1 infection    Morbid obesity (HCC)    Osteoarthritis    Rheumatoid arthritis(714.0)    M05.79   Rheumatoid arthritis, seropositive, 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HCC)    Treated with Orencia, TB neg 09/12/2015   Spondylolysis     Family History  Problem Relation Age of Onset   Hyperlipidemia Mother    Depression Mother    Sarcoidosis Father    Lung disease Father        Pleural Mesothelioma   Cancer Father    Heart attack Paternal Grandmother    Hypertension Paternal Grandmother    Hypertension Maternal Grandmother    Diabetes Maternal Grandmother    Stroke Neg Hx  Colon cancer Neg Hx    Esophageal cancer Neg Hx    Rectal cancer Neg Hx    Stomach cancer Neg Hx    Past Surgical History:  Procedure Laterality Date   CARDIAC CATHETERIZATION N/A 06/11/2016   Procedure: Left Heart Cath and Coronary Angiography;  Surgeon: Corky Crafts, MD;  Location: [facility] INVASIVE CV LAB;  Service: Cardiovascular;  Laterality: N/A;   CESAREAN  SECTION     COLONOSCOPY  07/27/2017   ESOPHAGEAL MANOMETRY N/A 10/28/2014   Procedure: ESOPHAGEAL MANOMETRY (EM);  Surgeon: Hilarie Fredrickson, MD;  Location: [facility] ENDOSCOPY;  Service: Endoscopy;  Laterality: N/A;   HERNIA REPAIR     reports surgery on 3 hernias, with 2 more present   LAPAROSCOPIC GASTRIC SLEEVE RESECTION  11/21/12   [facility]   LEFT HEART CATHETERIZATION WITH CORONARY ANGIOGRAM N/A 08/26/2014   Procedure: LEFT HEART CATHETERIZATION WITH CORONARY ANGIOGRAM;  Surgeon: Runell Gess, MD;  Location: [facility] CATH LAB;  Service: Cardiovascular;  Laterality: N/A;   TUBAL LIGATION     Social History   Social History Narrative   Marital Status: Married Probation officer)    Children:  Son [name]) Daughter [name])    Pets: None    Living Situation: Lives with husband and children    Occupation: Occupational psychologist Administrator)     Education: Oncologist (Psychology)     Tobacco Use/Exposure:  None    Alcohol Use:  Occasional   Drug Use:  None   Diet:  Regular   Exercise: Walking or Treadmill (2 x week)   Hobbies: Clinical cytogeneticist, [employer] Decorations.               Immunization History  Administered Date(s) Administered   PFIZER(Purple Top)SARS-COV-2 Vaccination 08/30/2019, 09/20/2019   Tdap 09/21/2016     Objective: Vital Signs: There were no vitals taken for this visit.   Physical Exam Vitals and nursing note reviewed.  Constitutional:      Appearance: She is well-developed.  HENT:     Head: Normocephalic and atraumatic.  Eyes:     Conjunctiva/sclera: Conjunctivae normal.  Pulmonary:     Effort: Pulmonary effort is normal.  Abdominal:     Palpations: Abdomen is soft.  Musculoskeletal:     Cervical back: Normal range of motion.  Skin:    General: Skin is warm and dry.     Capillary Refill: Capillary refill takes less than 2 seconds.  Neurological:     Mental Status: She is alert and oriented to person, place, and time.  Psychiatric:        Behavior:  Behavior normal.     Musculoskeletal Exam: ***  CDAI Exam: CDAI Score: -- Patient Global: --; Provider Global: -- Swollen: --; Tender: -- Joint Exam 12/31/2020   No joint exam has been documented for this visit   There is currently no information documented on the homunculus. Go to the Rheumatology activity and complete the homunculus joint exam.  Investigation: No additional findings.  Imaging: DG Chest 2 View  Result Date: 12/16/2020 CLINICAL DATA:  Chest pain and hypertension today. EXAM: CHEST - 2 VIEW COMPARISON:  PA and lateral chest 09/03/2020. FINDINGS: Lung volumes are low but the lungs are clear. Heart size is normal. No pneumothorax or pleural fluid. No acute or focal bony abnormality. IMPRESSION: Negative chest. Electronically Signed   By: Drusilla Kanner M.D.   On: 12/16/2020 20:22    Recent Labs: Lab Results  Component Value Date   WBC 10.2 12/16/2020  HGB 15.1 (H) 12/16/2020   PLT 269 12/16/2020   NA 141 12/16/2020   K 3.8 12/16/2020   CL 107 12/16/2020   CO2 26 12/16/2020   GLUCOSE 88 12/16/2020   BUN 13 12/16/2020   CREATININE 0.81 12/16/2020   BILITOT 0.6 11/19/2020   ALKPHOS 77 11/19/2020   AST 23 11/19/2020   ALT 17 11/19/2020   PROT 6.5 11/19/2020   ALBUMIN 3.3 (L) 11/19/2020   CALCIUM 9.1 12/16/2020   GFRAA >60 12/26/2019   QFTBGOLD Negative 09/01/2016   QFTBGOLDPLUS Negative 08/26/2020    Speciality Comments: ORENCIA 1000 mg x 4 weeks  Procedures:  No procedures performed Allergies: Influenza vaccines, Amitriptyline, and Isosorbide   Assessment / Plan:     Visit Diagnoses: Rheumatoid arthritis involving multiple sites with positive rheumatoid factor (HCC)  High risk medication use  Primary osteoarthritis of both knees  Fibromyalgia  Other fatigue  Other insomnia  Trochanteric bursitis of both hips  History of coronary artery disease  Dyslipidemia  History of depression  History of vitamin D deficiency  History of  hypertension  Solitary pulmonary nodule  History of diastolic dysfunction  S/P laparoscopic sleeve gastrectomy  Orders: No orders of the defined types were placed in this encounter.  No orders of the defined types were placed in this encounter.   Follow-Up Instructions: No follow-ups on file.   Gearldine Bienenstock, PA-C  Note - This record has been created using Dragon software.  Chart creation errors have been sought, but may not always  have been located. Such creation errors do not reflect on  the standard of medical care.

## 2021-01-09 ENCOUNTER — Other Ambulatory Visit: Payer: Self-pay

## 2021-01-09 ENCOUNTER — Encounter (HOSPITAL_BASED_OUTPATIENT_CLINIC_OR_DEPARTMENT_OTHER): Payer: Self-pay

## 2021-01-09 ENCOUNTER — Emergency Department (HOSPITAL_BASED_OUTPATIENT_CLINIC_OR_DEPARTMENT_OTHER)
Admission: EM | Admit: 2021-01-09 | Discharge: 2021-01-09 | Disposition: A | Discharge status: Home / Self Care | Payer: BC Managed Care – PPO | Attending: Emergency Medicine | Admitting: Emergency Medicine

## 2021-01-09 DIAGNOSIS — R519 Headache, unspecified: Secondary | ICD-10-CM | POA: Insufficient documentation

## 2021-01-09 DIAGNOSIS — Z79899 Other long term (current) drug therapy: Secondary | ICD-10-CM | POA: Diagnosis not present

## 2021-01-09 DIAGNOSIS — J029 Acute pharyngitis, unspecified: Secondary | ICD-10-CM | POA: Insufficient documentation

## 2021-01-09 DIAGNOSIS — I10 Essential (primary) hypertension: Secondary | ICD-10-CM | POA: Diagnosis not present

## 2021-01-09 DIAGNOSIS — R6889 Other general symptoms and signs: Secondary | ICD-10-CM

## 2021-01-09 DIAGNOSIS — M791 Myalgia, unspecified site: Secondary | ICD-10-CM | POA: Insufficient documentation

## 2021-01-09 DIAGNOSIS — I2510 Atherosclerotic heart disease of native coronary artery without angina pectoris: Secondary | ICD-10-CM | POA: Insufficient documentation

## 2021-01-09 DIAGNOSIS — Z20822 Contact with and (suspected) exposure to covid-19: Secondary | ICD-10-CM | POA: Insufficient documentation

## 2021-01-09 LAB — RESP PANEL BY RT-PCR (FLU A&B, COVID) ARPGX2
Influenza A by PCR: NEGATIVE
Influenza B by PCR: NEGATIVE
SARS Coronavirus 2 by RT PCR: NEGATIVE

## 2021-01-09 MED ORDER — ACETAMINOPHEN 500 MG PO TABS
1000.0000 mg | ORAL_TABLET | Freq: Once | ORAL | Status: AC
  Administered 2021-01-09: 1000 mg via ORAL
  Filled 2021-01-09: qty 2

## 2021-01-09 MED ORDER — KETOROLAC TROMETHAMINE 60 MG/2ML IM SOLN
30.0000 mg | Freq: Once | INTRAMUSCULAR | Status: AC
  Administered 2021-01-09: 30 mg via INTRAMUSCULAR
  Filled 2021-01-09: qty 2

## 2021-01-09 MED ORDER — DIAZEPAM 2 MG PO TABS
2.0000 mg | ORAL_TABLET | Freq: Once | ORAL | Status: AC
  Administered 2021-01-09: 2 mg via ORAL
  Filled 2021-01-09: qty 1

## 2021-01-09 NOTE — ED Provider Notes (Signed)
[facility] EMERGENCY DEPARTMENT Provider Note  CSN: [id number] Arrival date & time: 01/09/21 0019  Chief Complaint(s) Generalized Body Aches  HPI [name] [name] is a 55 y.o. female with a past medical history listed below who presents to the emergency department for 2 days of headache and myalgias.  Mild sore throat. Reports possible COVID exposure over the weekend.  No fevers or chills, coughing or congestion, nausea or vomiting, chest pain or abdominal pain.  No urinary symptoms.  Headache is frontal, moderate aching.  Has not taken any medicine for the pain.  HPI  Past Medical History Past Medical History:  Diagnosis Date   Anxiety    Bursitis    Coronary artery calcification seen on CAT scan    minimal CAD with 30% prox and mild LAD   Depression    Diastolic dysfunction    Dyslipidemia 01/30/2015   Esophageal ring    Fibromyalgia    Hiatal hernia    HSV-1 infection    Morbid obesity (HCC)    Osteoarthritis    Rheumatoid arthritis(714.0)    M05.79   Rheumatoid arthritis, seropositive, 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HCC)    Treated with Orencia, TB neg 09/12/2015   Spondylolysis    Patient Active Problem List   Diagnosis Date Noted   Amenorrhea 06/09/2020   Anxiety 02/20/2018   Fatigue 02/20/2018   Low blood potassium 02/20/2018   Allergic rhinitis 12/14/2017   Fever blister 12/14/2017   Head congestion 12/14/2017   Hyperlipidemia 12/14/2017   Lateral epicondylitis of right elbow 05/04/2017   Sacral pain 03/23/2017   High risk medication use 08/03/2016   Fibromyalgia syndrome 08/03/2016   Abnormal cardiac CT angiography    Sacrococcygeal pain 02/20/2016   Rheumatoid arthritis involving 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HCC) 01/24/2016   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   Dyslipidemia 01/30/2015   MDD (major depressive disorder), recurrent severe, 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   Essential hypertension 08/25/2014   Morbid obesity (HCC) 08/25/2014   CAD (coronary  artery disease), native coronary artery 08/24/2014   Numbness and tingling in left arm    Arm numbness left 08/22/2014   Chest pain 08/22/2014   Gastroesophageal reflux disease without esophagitis 07/23/2014   Coronary artery calcification seen on CAT scan 02/19/2014   Hip pain 10/23/2013   Solitary pulmonary nodule 08/28/2013   Chest pain, atypical 07/18/2013   Heart palpitations 06/27/2013   Cough 06/04/2013   Diastolic dysfunction 05/18/2013   SOB (shortness of breath) 05/09/2013   Other malaise and fatigue 01/21/2013   Stress and adjustment reaction 12/09/2012   Right sided abdominal pain 11/29/2012   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/21/2012   Morbid obesity with BMI of 50.0-59.9, adult (HCC) 11/21/2012   Arthritis 07/13/2012   Depression 07/13/2012   Routine health maintenance 05/07/2012   Migraine 06/07/1998   Home Medication(s) Prior to Admission medications   Medication Sig Start Date End Date Taking? Authorizing Provider  Abatacept (ORENCIA IV) Inject 1,000 mg into the vein every 28 (twenty-eight) days.    Pollyann Savoy, MD  Acetaminophen-Codeine 300-30 MG tablet TAKE 1-2 TABLETS BY MOUTH EVERY 8 HOURS AS NEEDED. 06/11/20   Kathryne Hitch, MD  Adapalene 0.3 % gel Apply 1 application topically daily as needed (acne).  12/31/14   [provider]  Alcohol Swabs (ALCOHOL WIPES) 70 % PADS Alcohol Prep Pads    [provider]  atorvastatin (LIPITOR) 80 MG tablet Add'l Sig Add'l Sig oral Add'l Sig 10/10/19   [provider]  azelastine (  ASTELIN) 0.1 % nasal spray Place 2 sprays into both nostrils 2 (two) times daily. 01/12/20   Cathie Hoops, Amy V, PA-C  benzonatate (TESSALON) 100 MG capsule Take 1 capsule (100 mg total) by mouth every 8 (eight) hours. 02/06/20   Hall-Potvin, [place], PA-C  buPROPion (WELLBUTRIN XL) 150 MG 24 hr tablet Take 450 mg by mouth every morning. 05/23/19   [provider]  cetirizine (ZYRTEC ALLERGY) 10 MG  tablet Take 1 tablet (10 mg total) by mouth daily. 02/06/20   Hall-Potvin, [place], PA-C  cyclobenzaprine (FLEXERIL) 10 MG tablet TAKE ONE TABLET BY MOUTH EVERY NIGHT AT BEDTIME AS NEEDED FOR MUSCLE SPASMS 12/23/20   Gearldine Bienenstock, PA-C  diazepam (VALIUM) 5 MG tablet Take 5 mg by mouth at bedtime. 05/05/20   [provider]  diclofenac Sodium (VOLTAREN) 1 % GEL Apply 2 grams to 4 grams to the affected area up to 4 times daily as needed. 09/05/20   Gearldine Bienenstock, PA-C  diltiazem (CARDIZEM CD) 180 MG 24 hr capsule TAKE ONE CAPSULE BY MOUTH DAILY 12/24/20   Quintella Reichert, MD  doxepin (SINEQUAN) 25 MG capsule Take 25 mg by mouth at bedtime as needed. 04/28/19   [provider]  EPINEPHrine 0.3 mg/0.3 mL IJ SOAJ injection epinephrine 0.3 mg/0.3 mL injection, auto-injector 07/02/19   [provider]  ezetimibe (ZETIA) 10 MG tablet Take 10 mg by mouth daily. 10/22/19   [provider]  FABB 2.2-25-1 MG TABS TAKE 2 TABLETS BY MOUTH EVERY DAY 08/18/20   Gearldine Bienenstock, PA-C  famciclovir (FAMVIR) 500 MG tablet Take 15,000 mg by mouth daily as needed (fever blister).  01/21/15   [provider]  fluticasone (FLONASE) 50 MCG/ACT nasal spray Place 1 spray into both nostrils daily. 02/06/20   Hall-Potvin, [place], PA-C  gabapentin (NEURONTIN) 300 MG capsule TAKE 1 CAPSULE BY MOUTH 3 TIMES A DAY AS NEEDED FOR PAIN 08/18/20   Gearldine Bienenstock, PA-C  hydrocortisone 2.5 % cream SMARTSIG:1 Topical Every Night 11/06/19   [provider]  ibuprofen (ADVIL) 800 MG tablet ibuprofen 800 mg tablet  TAKE 1 TABLET BY MOUTH THREE TIMES A DAY AS NEEDED    [provider]  ketoconazole (NIZORAL) 2 % cream  03/14/20   [provider]  ketoconazole (NIZORAL) 2 % shampoo  06/03/20   [provider]  levothyroxine (SYNTHROID) 25 MCG tablet levothyroxine 25 mcg tablet  TAKE 1 TABLET IN THE MORNING ON AN EMPTY STOMACH 30 MINUTES BEFORE FOOD    [provider]  lidocaine (LIDODERM) 5 % APPLY 1 PATCH TO AFFECTED AREA FOR 12 HOURS IN A 24 HOUR PERIOD 11/10/20   Gearldine Bienenstock, PA-C  LORazepam (ATIVAN) 1 MG tablet Take 1 tablet (1 mg total) by mouth 3 (three) times daily as needed for anxiety. 10/09/19   Moshe Cipro, NP  Methotrexate 10 MG/0.4ML SOSY 1 tab    [provider]  Methotrexate, PF, (RASUVO) 25 MG/0.5ML SOAJ INJECT ONE PEN SUBCUTANEOUSLY ONCE EVERY WEEK. STORE AT ROOM TEMPERATURE BETWEEN 68 - 77 DEGREES F. 05/13/20   Gearldine Bienenstock, PA-C  nitroGLYCERIN (NITROSTAT) 0.4 MG SL tablet Place 1 tablet (0.4 mg total) under the tongue every 5 (five) minutes as needed for chest pain. 05/01/19   Rosalio Macadamia, NP  [facility] powder Apply topically. 03/17/20   [provider]  Oxymetazoline HCl (NASAL SPRAY) 0.05 % SOLN Nasal Decongestant (oxymetazoline) 0.05 % spray    [provider]  propranolol (INDERAL) 10 MG tablet Take 10 mg by mouth daily.    [provider]  rosuvastatin (CRESTOR) 20 MG tablet Take 20 mg by mouth. 09/21/16 05/13/20  [provider]  Vitamin D, Ergocalciferol, (DRISDOL) 1.25 MG (50000 UT) CAPS capsule Take 50,000 Units by mouth See admin instructions. Take 1 tablet (50000 units) by mouth every Monday, Wednesday, Saturday    [provider]                                                                                                                                    Past Surgical History Past Surgical History:  Procedure Laterality Date   CARDIAC CATHETERIZATION N/A 06/11/2016   Procedure: Left Heart Cath and Coronary Angiography;  Surgeon: Corky Crafts, MD;  Location: [facility] INVASIVE CV LAB;  Service: Cardiovascular;  Laterality: N/A;   CESAREAN SECTION     COLONOSCOPY  07/27/2017   ESOPHAGEAL MANOMETRY N/A 10/28/2014   Procedure: ESOPHAGEAL MANOMETRY (EM);  Surgeon: Hilarie Fredrickson, MD;  Location: [facility] ENDOSCOPY;  Service: Endoscopy;  Laterality: N/A;   HERNIA REPAIR      reports surgery on 3 hernias, with 2 more present   LAPAROSCOPIC GASTRIC SLEEVE RESECTION  11/21/12   [facility]   LEFT HEART CATHETERIZATION WITH CORONARY ANGIOGRAM N/A 08/26/2014   Procedure: LEFT HEART CATHETERIZATION WITH CORONARY ANGIOGRAM;  Surgeon: Runell Gess, MD;  Location: [facility] CATH LAB;  Service: Cardiovascular;  Laterality: N/A;   TUBAL LIGATION     Family History Family History  Problem Relation Age of Onset   Hyperlipidemia Mother    Depression Mother    Sarcoidosis Father    Lung disease Father        Pleural Mesothelioma   Cancer Father    Heart attack Paternal Grandmother    Hypertension Paternal Grandmother    Hypertension Maternal Grandmother    Diabetes Maternal Grandmother    Stroke Neg Hx    Colon cancer Neg Hx    Esophageal cancer Neg Hx    Rectal cancer Neg Hx    Stomach cancer Neg Hx     Social History Social History   Tobacco Use   Smoking status: Never   Smokeless tobacco: Never  Vaping Use   Vaping Use: Never used  Substance Use Topics   Alcohol use: No   Drug use: No   Allergies Influenza vaccines, Amitriptyline, and Isosorbide  Review of Systems Review of Systems All other systems are reviewed and are negative for acute change except as noted in the HPI  Physical Exam Vital Signs  I have reviewed the triage vital signs BP 121/78 (BP Location: Right Arm)   Pulse 82   Temp 98.2 F (36.8 C) (Oral)   Resp 20   Ht  (1.575 m)   Wt 119.7 kg   SpO2 99%   BMI 48.29 kg/m   Physical Exam Vitals reviewed.  Constitutional:  General: She is not in acute distress.    Appearance: She is well-developed. She is not diaphoretic.  HENT:     Head: Normocephalic and atraumatic.     Nose: Nose normal.  Eyes:     General: No scleral icterus.       Right eye: No discharge.        Left eye: No discharge.     Conjunctiva/sclera: Conjunctivae normal.     Pupils: Pupils are equal, round, and reactive to light.  Cardiovascular:      Rate and Rhythm: Normal rate and regular rhythm.     Heart sounds: No murmur heard.   No friction rub. No gallop.  Pulmonary:     Effort: Pulmonary effort is normal. No respiratory distress.     Breath sounds: Normal breath sounds. No stridor. No rales.  Abdominal:     General: There is no distension.     Palpations: Abdomen is soft.     Tenderness: There is no abdominal tenderness.  Musculoskeletal:        General: No tenderness.     Cervical back: Normal range of motion and neck supple.  Skin:    General: Skin is warm and dry.     Findings: No erythema or rash.  Neurological:     Mental Status: She is alert and oriented to person, place, and time.     Cranial Nerves: Cranial nerves are intact.     Sensory: Sensation is intact.     Motor: Motor function is intact.    ED Results and Treatments Labs (all labs ordered are listed, but only abnormal results are displayed) Labs Reviewed  RESP PANEL BY RT-PCR (FLU A&B, COVID) [id number]                                                                                                                         EKG  EKG Interpretation  Date/Time:    Ventricular Rate:    PR Interval:    QRS Duration:   QT Interval:    QTC Calculation:   R Axis:     Text Interpretation:         Radiology No results found.  Pertinent labs & imaging results that were available during my care of the patient were reviewed by me and considered in my medical decision making (see MDM for details).  Medications Ordered in ED Medications  ketorolac (TORADOL) injection 30 mg (30 mg Intramuscular Given 01/09/21 0148)  acetaminophen (TYLENOL) tablet 1,000 mg (1,000 mg Oral Given 01/09/21 0148)  Procedures Procedures  (including critical care time)  Medical Decision Making / ED Course I have reviewed the nursing notes for this  encounter and the patient's prior records (if available in EHR or on provided paperwork).  DWANDA TUFANO was evaluated in Emergency Department on 01/09/2021 for the symptoms described in the history of present illness. She was evaluated in the context of the global COVID-19 pandemic, which necessitated consideration that the patient might be at risk for infection with the SARS-CoV-2 virus that causes COVID-19. Institutional protocols and algorithms that pertain to the evaluation of patients at risk for COVID-19 are in a state of rapid change based on information released by regulatory bodies including the CDC and federal and state organizations. These policies and algorithms were followed during the patient's care in the ED.     Non focal neuro exam. No recent head trauma. No fever. Doubt meningitis. Doubt intracranial bleed. Doubt IIH. No indication for imaging.    Patient appears well. No signs of toxicity, patient is interactive. No hypoxia, tachypnea or other signs of respiratory distress. No sign of clinical dehydration. Lung exam clear. Rest of exam as above.  Most consistent with viral illness.  COVID/Flu negative.   No evidence suggestive of pharyngitis, AOM, PNA, or meningitis.  Chest x-ray not indicated at this time.  Headache and myalgia improved with IM toradol.  Discussed symptomatic treatment with the patient and they will follow closely with their PCP.    Pertinent labs & imaging results that were available during my care of the patient were reviewed by me and considered in my medical decision making:    Final Clinical Impression(s) / ED Diagnoses Final diagnoses:  Flu-like symptoms   The patient appears reasonably screened and/or stabilized for discharge and I doubt any other medical condition or other [facility] requiring further screening, evaluation, or treatment in the ED at this time prior to discharge. Safe for discharge with strict return  precautions.  Disposition: Discharge  Condition: Good  I have discussed the results, Dx and Tx plan with the patient/family who expressed understanding and agree(s) with the plan. Discharge instructions discussed at length. The patient/family was given strict return precautions who verbalized understanding of the instructions. No further questions at time of discharge.    ED Discharge Orders     None        Follow Up: [name], MD [address] [place] [address] [phone]  Call  as needed    This chart was dictated using voice recognition software.  Despite best efforts to proofread,  errors can occur which can change the documentation meaning.    Nira Conn, MD 01/09/21 [phone]

## 2021-01-09 NOTE — ED Triage Notes (Signed)
Complains of headache sore throat and body aches.

## 2021-01-13 ENCOUNTER — Other Ambulatory Visit: Payer: Self-pay | Admitting: Pharmacist

## 2021-01-13 DIAGNOSIS — M0579 Rheumatoid arthritis with rheumatoid factor of multiple sites without organ or systems involvement: Secondary | ICD-10-CM

## 2021-01-13 DIAGNOSIS — Z79899 Other long term (current) drug therapy: Secondary | ICD-10-CM

## 2021-01-13 NOTE — Progress Notes (Signed)
ext infusion scheduled for Orencia IV on 01/14/21 and due for updated orders. Diagnosis: rheumatoid arthritis  Dose: 1000mg  every 28 days (appropriate based on last recorded weight of 119.7 kg)  Last Clinic Visit: 05/13/20 Next Clinic Visit: not yet scheduled, will schedule for July 2022/August 2022  Last infusion: 12/17/20 Labs: CBC and CMP on 12/16/20 were stable TB Gold: negative on 08/06/20   Orders placed for Orencia IV 1000mg  x 1 dose along with premedication of Tylenol and Benadryl to be administered 30 minutes before medication infusion. Patient will need f/u appointment   Standing CBC with diff/platelet and CMP with GFR orders placed to be drawn every 2 months.  Next TB gold due March 2023  , PharmD, MPH Clinical Pharmacist (Rheumatology and Pulmonology)

## 2021-01-14 ENCOUNTER — Inpatient Hospital Stay (HOSPITAL_COMMUNITY): Admission: RE | Admit: 2021-01-14 | Payer: BC Managed Care – PPO | Source: Ambulatory Visit

## 2021-01-22 ENCOUNTER — Other Ambulatory Visit: Payer: Self-pay

## 2021-01-23 MED ORDER — DILTIAZEM HCL ER COATED BEADS 180 MG PO CP24: 180.0000 mg | ORAL_CAPSULE | Freq: Every day | ORAL | 0 refills | Status: DC

## 2021-02-04 ENCOUNTER — Other Ambulatory Visit: Payer: Self-pay

## 2021-02-06 NOTE — Telephone Encounter (Signed)
LMOM for patient to call and schedule follow-up appointment.   °

## 2021-02-11 ENCOUNTER — Ambulatory Visit (HOSPITAL_COMMUNITY): Payer: BC Managed Care – PPO

## 2021-03-19 ENCOUNTER — Other Ambulatory Visit: Payer: Self-pay

## 2021-03-19 ENCOUNTER — Ambulatory Visit (HOSPITAL_COMMUNITY)
Admission: RE | Admit: 2021-03-19 | Discharge: 2021-03-19 | Disposition: A | Discharge status: Home / Self Care | Payer: BC Managed Care – PPO | Source: Ambulatory Visit | Attending: Rheumatology | Admitting: Rheumatology

## 2021-03-19 DIAGNOSIS — Z79899 Other long term (current) drug therapy: Secondary | ICD-10-CM | POA: Insufficient documentation

## 2021-03-19 DIAGNOSIS — M0579 Rheumatoid arthritis with rheumatoid factor of multiple sites without organ or systems involvement: Secondary | ICD-10-CM | POA: Diagnosis present

## 2021-03-19 LAB — COMPREHENSIVE METABOLIC PANEL
ALT: 19 U/L (ref 0–44)
AST: 31 U/L (ref 15–41)
Albumin: 3.2 g/dL — ABNORMAL LOW (ref 3.5–5.0)
Alkaline Phosphatase: 75 U/L (ref 38–126)
Anion gap: 7 (ref 5–15)
BUN: 13 mg/dL (ref 6–20)
CO2: 26 mmol/L (ref 22–32)
Calcium: 8.9 mg/dL (ref 8.9–10.3)
Chloride: 107 mmol/L (ref 98–111)
Creatinine, Ser: 1.23 mg/dL — ABNORMAL HIGH (ref 0.44–1.00)
GFR, Estimated: 53 mL/min — ABNORMAL LOW (ref >60)
Glucose, Bld: 83 mg/dL (ref 70–99)
Potassium: 4.3 mmol/L (ref 3.5–5.1)
Sodium: 140 mmol/L (ref 135–145)
Total Bilirubin: 1.1 mg/dL (ref 0.3–1.2)
Total Protein: 6.5 g/dL (ref 6.5–8.1)

## 2021-03-19 LAB — CBC WITH DIFFERENTIAL/PLATELET
Abs Immature Granulocytes: 0.04 10*3/uL (ref 0.00–0.07)
Basophils Absolute: 0.1 10*3/uL (ref 0.0–0.1)
Basophils Relative: 1 %
Eosinophils Absolute: 0.3 10*3/uL (ref 0.0–0.5)
Eosinophils Relative: 3 %
HCT: 42 % (ref 36.0–46.0)
Hemoglobin: 13.2 g/dL (ref 12.0–15.0)
Immature Granulocytes: 1 %
Lymphocytes Relative: 44 %
Lymphs Abs: 3.5 10*3/uL (ref 0.7–4.0)
MCH: 26.7 pg (ref 26.0–34.0)
MCHC: 31.4 g/dL (ref 30.0–36.0)
MCV: 85 fL (ref 80.0–100.0)
Monocytes Absolute: 0.5 10*3/uL (ref 0.1–1.0)
Monocytes Relative: 7 %
Neutro Abs: 3.6 10*3/uL (ref 1.7–7.7)
Neutrophils Relative %: 44 %
Platelets: 276 10*3/uL (ref 150–400)
RBC: 4.94 MIL/uL (ref 3.87–5.11)
RDW: 12.9 % (ref 11.5–15.5)
WBC: 8 10*3/uL (ref 4.0–10.5)
nRBC: 0 % (ref 0.0–0.2)

## 2021-03-19 MED ORDER — SODIUM CHLORIDE 0.9 % IV SOLN
1000.0000 mg | INTRAVENOUS | Status: AC
  Administered 2021-03-19: 1000 mg via INTRAVENOUS
  Filled 2021-03-19: qty 40

## 2021-03-19 MED ORDER — ACETAMINOPHEN 325 MG PO TABS
ORAL_TABLET | ORAL | Status: AC
  Filled 2021-03-19: qty 2

## 2021-03-19 MED ORDER — ACETAMINOPHEN 325 MG PO TABS
650.0000 mg | ORAL_TABLET | Freq: Once | ORAL | Status: AC
  Administered 2021-03-19: 650 mg via ORAL

## 2021-03-19 MED ORDER — DIPHENHYDRAMINE HCL 25 MG PO CAPS
25.0000 mg | ORAL_CAPSULE | Freq: Once | ORAL | Status: AC
  Administered 2021-03-19: 25 mg via ORAL

## 2021-03-19 MED ORDER — DIPHENHYDRAMINE HCL 25 MG PO CAPS
ORAL_CAPSULE | ORAL | Status: AC
  Filled 2021-03-19: qty 1

## 2021-03-19 NOTE — Progress Notes (Signed)
Office Visit Note  Patient: [name] [name]             Date of Birth: Oct 18, 1965           MRN: [id number]             PCP: [name], MD Referring: [name], MD Visit Date: 03/20/2021 Occupation: @GUAROCC @  Subjective:  Pain in multiple joints   History of Present Illness: [name] [name] is a 55 y.o. female with history of seropositive rheumatoid arthritis and fibromyalgia.  Patient remains under tremendous amount of stress grieving the loss of her son and father-in-law.  She reports not being compliant with her Orencia infusions or taking Rasuvo since she has not been feeling like herself.  She has been having recurrent rheumatoid arthritis and fibromyalgia flares.  She is currently having a flare in the right hand and is having increased discomfort in her right knee and right ankle joint.  She states that her insurance declined Visco gel injections for the right knee when applied for by her orthopedist.  She would like 40 to retry applying for Visco for the right knee since it has been beneficial in the past.  She had her Orencia infusion yesterday but has not taken Rasuvo in over 1 month.  She has been taking Tylenol once or twice a week for pain relief but has not taken prednisone and avoids all NSAID use.  Activities of Daily Living:  Patient reports morning stiffness for 1 hour.   Patient Reports nocturnal pain.  Difficulty dressing/grooming: Reports Difficulty climbing stairs: Reports Difficulty getting out of chair: Reports Difficulty using hands for taps, buttons, cutlery, and/or writing: Reports  Review of Systems  Constitutional:  Positive for fatigue.  HENT:  Positive for mouth dryness. Negative for mouth sores and nose dryness.   Eyes:  Positive for dryness. Negative for pain and itching.  Respiratory:  Negative for shortness of breath and difficulty breathing.   Cardiovascular:  Positive for palpitations. Negative for chest pain.   Gastrointestinal:  Negative for blood in stool, constipation and diarrhea.  Endocrine: Negative for increased urination.  Genitourinary:  Positive for difficulty urinating. Negative for painful urination.  Musculoskeletal:  Positive for joint pain, joint pain, joint swelling, myalgias, morning stiffness, muscle tenderness and myalgias.  Skin:  Positive for color change. Negative for rash and redness.  Allergic/Immunologic: Negative for susceptible to infections.  Neurological:  Positive for dizziness, headaches and weakness. Negative for numbness and memory loss.  Hematological:  Positive for bruising/bleeding tendency.  Psychiatric/Behavioral:  Positive for confusion. The patient is nervous/anxious.    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   Dyslipidemia 01/30/2015   MDD (major depressive disorder), recurrent severe, 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   Essential hypertension 08/25/2014   Morbid obesity (HCC) 08/25/2014   CAD (coronary artery disease), native coronary artery 08/24/2014   Numbness and tingling in left arm    Arm numbness  left 08/22/2014   Chest pain 08/22/2014   Gastroesophageal reflux disease without esophagitis 07/23/2014   Coronary artery calcification seen on CAT scan 02/19/2014   Hip pain 10/23/2013   Solitary pulmonary nodule 08/28/2013   Chest pain, atypical 07/18/2013   Heart palpitations  06/27/2013   Cough 06/04/2013   Diastolic dysfunction 05/18/2013   SOB (shortness of breath) 05/09/2013   Other malaise and fatigue 01/21/2013   Stress and adjustment reaction 12/09/2012   Right sided abdominal pain 11/29/2012   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/21/2012   Morbid obesity with BMI of 50.0-59.9, adult (HCC) 11/21/2012   Arthritis 07/13/2012   Depression 07/13/2012   Routine health maintenance 05/07/2012   Migraine 06/07/1998    Past Medical History:  Diagnosis Date   Anxiety    Bursitis    Coronary artery calcification seen on CAT scan    minimal CAD with 30% prox and mild LAD   Depression    Diastolic dysfunction    Dyslipidemia 01/30/2015   Esophageal ring    Fibromyalgia    Hiatal hernia    HSV-1 infection    Morbid obesity (HCC)    Osteoarthritis    Rheumatoid arthritis(714.0)    M05.79   Rheumatoid arthritis, seropositive, 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HCC)    Treated with Orencia, TB neg 09/12/2015   Spondylolysis     Family History  Problem Relation Age of Onset   Hyperlipidemia Mother    Depression Mother    Sarcoidosis Father    Lung disease Father        Pleural Mesothelioma   Cancer Father    Heart attack Paternal Grandmother    Hypertension Paternal Grandmother    Hypertension Maternal Grandmother    Diabetes Maternal Grandmother    Stroke Neg Hx    Colon cancer Neg Hx    Esophageal cancer Neg Hx    Rectal cancer Neg Hx    Stomach cancer Neg Hx    Past Surgical History:  Procedure Laterality Date   CARDIAC CATHETERIZATION N/A 06/11/2016   Procedure: Left Heart Cath and Coronary Angiography;  Surgeon: Corky Crafts, MD;  Location[facility] INVASIVE CV LAB;  Service: Cardiovascular;  Laterality: N/A;   CESAREAN SECTION     COLONOSCOPY  07/27/2017   ESOPHAGEAL MANOMETRY N/A 10/28/2014   Procedure: ESOPHAGEAL MANOMETRY (EM);  Surgeon: Hilarie Fredrickson, MD;  Location: [facility] ENDOSCOPY;  Service: Endoscopy;  Laterality: N/A;   HERNIA REPAIR      reports surgery on 3 hernias, with 2 more present   LAPAROSCOPIC GASTRIC SLEEVE RESECTION  11/21/12   [facility]   LEFT HEART CATHETERIZATION WITH CORONARY ANGIOGRAM N/A 08/26/2014   Procedure: LEFT HEART CATHETERIZATION WITH CORONARY ANGIOGRAM;  Surgeon: Runell Gess, MD;  Location: [facility] CATH LAB;  Service: Cardiovascular;  Laterality: N/A;   TUBAL LIGATION     Social History   Social History Narrative   Marital Status: Married Probation officer)    Children:  Son [name]) Daughter [name])    Pets: None    Living Situation: Lives with husband and children    Occupation: Occupational psychologist Administrator)     Education: Oncologist (Psychology)     Tobacco Use/Exposure:  None    Alcohol Use:  Occasional   Drug Use:  None   Diet:  Regular   Exercise: Walking or Treadmill (2 x week)   Hobbies: Clinical cytogeneticist, [employer]ations.               Immunization History  Administered Date(s) Administered   PFIZER(Purple Top)SARS-COV-2 Vaccination 08/30/2019, 09/20/2019   Tdap 09/21/2016     Objective: Vital Signs: BP (!) 140/99 (BP Location: Right Arm, Patient Position: Sitting, Cuff Size: Large)   Pulse 79   Ht 5\' 2"  (1.575 m)   Wt 266 lb 3.2 oz (120.7 kg)   BMI 48.69 kg/m    Physical Exam Vitals and nursing note reviewed.  Constitutional:      Appearance: She is well-developed.  HENT:     Head: Normocephalic and atraumatic.  Eyes:     Conjunctiva/sclera: Conjunctivae normal.  Pulmonary:     Effort: Pulmonary effort is normal.  Abdominal:     Palpations: Abdomen is soft.  Musculoskeletal:     Cervical back: Normal range of motion.  Skin:    General: Skin is warm and dry.     Capillary Refill: Capillary refill takes less than 2 seconds.  Neurological:     Mental Status: She is alert and oriented to person, place, and time.  Psychiatric:        Behavior: Behavior normal.     Musculoskeletal Exam: Patient remained seated during examination.  Generalized  hyperalgesia and positive tender points on exam.  C-spine is limited range of motion without rotation.  Trapezius muscle tension and tenderness bilaterally.  Painful range of motion of both shoulders with abduction to about 120 degrees.  Elbow joints have good range of motion with no tenderness or inflammation.  Tenderness of the right wrist noted.  Tenderness and synovitis of the right second and third MCP joints.  Difficulty making a complete fist with the right hand.  Painful range of motion of both knee joints especially the right knee.  Warmth and swelling of the right knee noted.  Ankle joints have good range of motion with tenderness in the right ankle.    CDAI Exam: CDAI Score: 9.2  Patient Global: 7 mm; Provider Global: 5 mm Swollen: 3 ; Tender: 5  Joint Exam 03/20/2021      Right  Left  Wrist   Tender     MCP 2  Swollen Tender     MCP 3  Swollen Tender     MCP 4   Tender     Knee  Swollen Tender        Investigation: No additional findings.  Imaging: No results found.  Recent Labs: Lab Results  Component Value Date   WBC 8.0 03/19/2021   HGB 13.2 03/19/2021   PLT 276 03/19/2021   NA 140 03/19/2021   K 4.3 03/19/2021   CL 107 03/19/2021   CO2 26 03/19/2021   GLUCOSE 83 03/19/2021   BUN 13 03/19/2021   CREATININE 1.23 (H) 03/19/2021   BILITOT 1.1 03/19/2021   ALKPHOS 75 03/19/2021   AST 31 03/19/2021   ALT 19 03/19/2021   PROT 6.5 03/19/2021   ALBUMIN 3.2 (L) 03/19/2021   CALCIUM 8.9 03/19/2021   GFRAA >60 12/26/2019   QFTBGOLD Negative 09/01/2016   QFTBGOLDPLUS Negative 08/26/2020    Speciality Comments: ORENCIA 1000 mg x 4 weeks  Procedures:  No procedures performed Allergies: Influenza vaccines, Amitriptyline, and Isosorbide    Assessment / Plan:     Visit Diagnoses: Rheumatoid arthritis involving multiple sites with positive rheumatoid factor (HCC): She presents today experiencing a rheumatoid arthritis and fibromyalgia flare.  She currently has  tenderness and synovitis over the right second and third MCPs and tenderness over her right wrist.  She also has warmth and swelling in  her right knee and tenderness over the right ankle.  She previously missed several doses of IV Orencia but had her most recent infusion yesterday.  She has not taken Rasuvo in over 1 month.  She has been under tremendous amount of stress grieving the loss of her son and father-in-law and does not feel as though she has been taking care of herself.  We discussed the importance of medication compliance.  She has been taking Tylenol once or twice a week for pain relief.  She avoids the use of NSAIDs and has not taken prednisone recently.  Lab work from yesterday was reviewed with the patient today in the office: Creatinine was 1.23 and GFR was 53.  I asked several times if she has used any NSAIDs and she denied.  I discussed that I would like to recheck BMP with GFR in 2 weeks so future order was placed today.  She was advised to continue to hold Rasuvo until after updating lab work.  A prednisone taper starting at 20 mg tapering by 5 mg every 4 days was sent to the pharmacy.  She will continue on IV Orencia infusions on a monthly basis.  High risk medication use -IV Orencia 1,000 mg infusions every 28 days.  Most recent infusion on 03/19/2021.  CBC and CMP drawn with yesterday's infusion on 03/19/2021.  Results were reviewed with the patient today in the office: Creatinine was 1.23 and GFR was 53.  She denied the use of NSAIDs.  She has not taken Rasuvo in over 1 month.  Medication list was reviewed today in the office.  Discussed the importance of avoiding all NSAID use and to continue to hold Rasuvo until we have recheck lab work in 2 weeks.  Future order for BMP with GFR will be placed today.  Plan: CBC with Differential/Platelet, COMPLETE METABOLIC PANEL WITH GFR TB Gold negative on 08/26/2020 and will continue to be monitored yearly. Discussed the importance of postponing  Orencia infusions as she develops signs or symptoms of an infection and to resume once infection has completely cleared.  Primary osteoarthritis of both knees: She has chronic pain in both knee joints especially her right knee.  She has had Visco gel injections in the past which provided significant pain relief.  She would like to reapply for Visco gel injections for the right knee.  X-rays of the right knee were updated today.  Chronic pain of right knee - She has chronic pain and instability in her right knee.  She had an MRI of the right knee on 05/23/2020 which was negative for meniscal or ligament tear.  Warmth and swelling of the right knee was noted on examination today.  In the past she benefited from Visco gel injections so we will reapply for Visco for the right knee.  She does not want to proceed with a knee replacement at this time.  Discussed the importance of lower extremity muscle strengthening.  X-rays of the right knee were updated today.  Plan: XR KNEE 3 VIEW RIGHT  Fibromyalgia: She has generalized hyperalgesia and positive tender points on examination.  She is currently having a fibromyalgia flare.  Discussed the importance of regular exercise, good sleep hygiene, and stress management.  She takes gabapentin 300 mg 1 capsule 3 times daily and Flexeril 10 mg at bedtime as needed for muscle spasms.  She is also been using Lidoderm patches as needed for pain relief.  Refills for Lidoderm patches, gabapentin and Flexeril were sent to  the pharmacy today.  Other fatigue: Chronic and secondary to insomnia.  Discussed the importance of regular exercise.  She takes Flexeril 10 mg at bedtime as needed for muscle spasms and insomnia.  A refill was sent to the pharmacy today.  Discussed the importance of good sleep hygiene.  Other insomnia: She experiences interrupted sleep at night due to nocturnal pain.  Trochanteric bursitis of both hips: She has tenderness over bilateral trochanteric bursa.   Discussed the importance of performing stretching exercises daily.  Other medical conditions are listed as follows:  History of coronary artery disease  Dyslipidemia  History of depression: She has been grieving the loss of her son and father-in-law and has been under tremendous amount of stress which has been affecting her mood and compliance with medications.  History of vitamin D deficiency: She takes a vitamin D supplement.   History of hypertension  Solitary pulmonary nodule  History of diastolic dysfunction  S/P laparoscopic sleeve gastrectomy    Orders: Orders Placed This Encounter  Procedures   XR KNEE 3 VIEW RIGHT   CBC with Differential/Platelet   COMPLETE METABOLIC PANEL WITH GFR   Meds ordered this encounter  Medications   gabapentin (NEURONTIN) 300 MG capsule    Sig: TAKE 1 CAPSULE BY MOUTH 3 TIMES A DAY AS NEEDED FOR PAIN    Dispense:  270 capsule    Refill:  0   cyclobenzaprine (FLEXERIL) 10 MG tablet    Sig: TAKE ONE TABLET BY MOUTH EVERY NIGHT AT BEDTIME AS NEEDED FOR MUSCLE SPASMS    Dispense:  30 tablet    Refill:  0   lidocaine (LIDODERM) 5 %    Sig: APPLY 1 PATCH TO AFFECTED AREA FOR 12 HOURS IN A 24 HOUR PERIOD    Dispense:  30 patch    Refill:  2   Folic Acid-Vit B6-Vit B12 (FABB) 2.2-25-1 MG TABS    Sig: Take 1 tablet by mouth daily.    Dispense:  180 tablet    Refill:  3   predniSONE (DELTASONE) 5 MG tablet    Sig: Take 4 tablets by mouth daily x4 days, 3 tablets daily x4 days, 2 tablets daily x4 days, 1 tablet daily x4 days.    Dispense:  40 tablet    Refill:  0      Follow-Up Instructions: Return in 3 months (on 06/20/2021) for Rheumatoid arthritis, Fibromyalgia, Osteoarthritis.   Gearldine Bienenstock, PA-C  Note - This record has been created using Dragon software.  Chart creation errors have been sought, but may not always  have been located. Such creation errors do not reflect on  the standard of medical care.

## 2021-03-19 NOTE — Progress Notes (Signed)
CBC WNL.  Creatinine is elevated-1.23 and GFR is 53.  Please clarify if she is still taking methotrexate.  Please also clarify if she has been taking any NSAIDs. Please discourage the use of NSAIDs.   Albumin remains low but is stable.  Rest of CMP WNL.

## 2021-03-20 ENCOUNTER — Ambulatory Visit (INDEPENDENT_AMBULATORY_CARE_PROVIDER_SITE_OTHER): Payer: BC Managed Care – PPO | Admitting: Physician Assistant

## 2021-03-20 ENCOUNTER — Encounter: Payer: Self-pay | Admitting: Physician Assistant

## 2021-03-20 ENCOUNTER — Ambulatory Visit: Payer: Self-pay

## 2021-03-20 ENCOUNTER — Telehealth: Payer: Self-pay

## 2021-03-20 VITALS — BP 140/99 | HR 79 | Ht 62.0 in | Wt 266.2 lb

## 2021-03-20 DIAGNOSIS — Z8639 Personal history of other endocrine, nutritional and metabolic disease: Secondary | ICD-10-CM

## 2021-03-20 DIAGNOSIS — M797 Fibromyalgia: Secondary | ICD-10-CM | POA: Diagnosis not present

## 2021-03-20 DIAGNOSIS — M170 Bilateral primary osteoarthritis of knee: Secondary | ICD-10-CM | POA: Diagnosis not present

## 2021-03-20 DIAGNOSIS — Z79899 Other long term (current) drug therapy: Secondary | ICD-10-CM

## 2021-03-20 DIAGNOSIS — M0579 Rheumatoid arthritis with rheumatoid factor of multiple sites without organ or systems involvement: Secondary | ICD-10-CM

## 2021-03-20 DIAGNOSIS — M25561 Pain in right knee: Secondary | ICD-10-CM

## 2021-03-20 DIAGNOSIS — G8929 Other chronic pain: Secondary | ICD-10-CM | POA: Diagnosis not present

## 2021-03-20 DIAGNOSIS — G4709 Other insomnia: Secondary | ICD-10-CM

## 2021-03-20 DIAGNOSIS — Z9884 Bariatric surgery status: Secondary | ICD-10-CM

## 2021-03-20 DIAGNOSIS — E785 Hyperlipidemia, unspecified: Secondary | ICD-10-CM

## 2021-03-20 DIAGNOSIS — M7062 Trochanteric bursitis, left hip: Secondary | ICD-10-CM

## 2021-03-20 DIAGNOSIS — Z8659 Personal history of other mental and behavioral disorders: Secondary | ICD-10-CM

## 2021-03-20 DIAGNOSIS — M7061 Trochanteric bursitis, right hip: Secondary | ICD-10-CM

## 2021-03-20 DIAGNOSIS — R911 Solitary pulmonary nodule: Secondary | ICD-10-CM

## 2021-03-20 DIAGNOSIS — Z8679 Personal history of other diseases of the circulatory system: Secondary | ICD-10-CM

## 2021-03-20 DIAGNOSIS — R5383 Other fatigue: Secondary | ICD-10-CM

## 2021-03-20 MED ORDER — FABB 2.2-25-1 MG PO TABS: 1.0000 | ORAL_TABLET | Freq: Every day | ORAL | 3 refills | Status: DC

## 2021-03-20 MED ORDER — PREDNISONE 5 MG PO TABS: ORAL_TABLET | ORAL | 0 refills | Status: DC

## 2021-03-20 MED ORDER — LIDOCAINE 5 % EX PTCH: MEDICATED_PATCH | CUTANEOUS | 2 refills | Status: DC

## 2021-03-20 MED ORDER — GABAPENTIN 300 MG PO CAPS: ORAL_CAPSULE | ORAL | 0 refills | Status: DC

## 2021-03-20 MED ORDER — CYCLOBENZAPRINE HCL 10 MG PO TABS: ORAL_TABLET | ORAL | 0 refills | Status: DC

## 2021-03-20 NOTE — Patient Instructions (Signed)
Standing Labs We placed an order today for your standing lab work.   Please have your standing labs drawn in 2 weeks   If possible, please have your labs drawn 2 weeks prior to your appointment so that the provider can discuss your results at your appointment.  Please note that you may see your imaging and lab results in MyChart before we have reviewed them. We may be awaiting multiple results to interpret others before contacting you. Please allow our office up to 72 hours to thoroughly review all of the results before contacting the office for clarification of your results.  We have open lab daily: Monday through Thursday from 1:30-4:30 PM and Friday from 1:30-4:00 PM at the office of Dr. Pollyann Savoy, [facility].   Please be advised, all patients with office appointments requiring lab work will take precedent over walk-in lab work.  If possible, please come for your lab work on Monday and Friday afternoons, as you may experience shorter wait times. The office is located at [address], [address], [place], [place] [address] No appointment is necessary.   Labs are drawn by [facility]. Please bring your co-pay at the time of your lab draw.  You may receive a bill from [facility] for your lab work.  If you wish to have your labs drawn at another location, please call the office 24 hours in advance to send orders.  If you have any questions regarding directions or hours of operation,  please call [phone].   As a reminder, please drink plenty of water prior to coming for your lab work. Thanks!

## 2021-03-20 NOTE — Telephone Encounter (Signed)
Please apply for right knee visco, per Taylor Dale, PA-C. Thanks!  

## 2021-03-23 NOTE — Progress Notes (Signed)
X-rays of the right knee are consistent with moderate OA and moderate chondromalacia patella. Please notify the patient. She would like to proceed with visco gel injections as discussed at her follow up visit.

## 2021-03-24 NOTE — Telephone Encounter (Signed)
Submitted for [facility] 03/24/2021.

## 2021-03-27 NOTE — Telephone Encounter (Signed)
Please notify the patient.  Please see if we have any samples for orthovisc or euflexxa?

## 2021-03-27 NOTE — Telephone Encounter (Signed)
Per [facility], patient has no coverage for Visco Supplementation. Please advise.

## 2021-03-27 NOTE — Telephone Encounter (Signed)
Message sent to Euflexxa rep to see if we can get a sample box for patient.

## 2021-04-02 NOTE — Telephone Encounter (Signed)
Requested Euflexxa samples from [employer].

## 2021-04-03 ENCOUNTER — Other Ambulatory Visit: Payer: Self-pay | Admitting: Pharmacist

## 2021-04-03 DIAGNOSIS — Z111 Encounter for screening for respiratory tuberculosis: Secondary | ICD-10-CM

## 2021-04-03 DIAGNOSIS — Z79899 Other long term (current) drug therapy: Secondary | ICD-10-CM

## 2021-04-03 DIAGNOSIS — M0579 Rheumatoid arthritis with rheumatoid factor of multiple sites without organ or systems involvement: Secondary | ICD-10-CM

## 2021-04-03 NOTE — Progress Notes (Signed)
Next infusion scheduled for Orencia IV on 04/16/21 and due for updated orders. Diagnosis: RA  Dose: 1000mg  every 28 days (appropriate based on last recorded weight of 120.7kg)  Last Clinic Visit: 03/20/21 Next Clinic Visit: 08/21/21  Last infusion: 03/19/21  Labs: CBC w diff and CMP on 03/19/21 - creatinine elevated TB Gold: negative on 08/26/20   Orders placed for Orencia IV x 3 doses along with premedication of acetaminophen and diphenhydramine to be administered 30 minutes before medication infusion.  Standing CBC with diff/platelet and CMP with GFR orders placed to be drawn every 2 months.  Next TB gold due 08/26/2021  08/28/2021, PharmD, MPH, BCPS Clinical Pharmacist (Rheumatology and Pulmonology)

## 2021-04-07 NOTE — Telephone Encounter (Signed)
Please call patient to schedule Visco Knee injections.  Received a sample box of Euflexxa series for right knee. Insurance policy has no benefits for medication. Administration cost covered at 100% of the allowable amount, with no copay required. Deductible does not apply.  No PA required.

## 2021-04-09 ENCOUNTER — Other Ambulatory Visit: Payer: Self-pay | Admitting: *Deleted

## 2021-04-09 DIAGNOSIS — Z79899 Other long term (current) drug therapy: Secondary | ICD-10-CM

## 2021-04-10 ENCOUNTER — Other Ambulatory Visit: Payer: Self-pay | Admitting: *Deleted

## 2021-04-10 LAB — BASIC METABOLIC PANEL WITH GFR
BUN: 11 mg/dL (ref 7–25)
CO2: 27 mmol/L (ref 20–32)
Calcium: 9.3 mg/dL (ref 8.6–10.4)
Chloride: 108 mmol/L (ref 98–110)
Creat: 0.92 mg/dL (ref 0.50–1.03)
Glucose, Bld: 87 mg/dL (ref 65–99)
Potassium: 4.7 mmol/L (ref 3.5–5.3)
Sodium: 145 mmol/L (ref 135–146)
eGFR: 74 mL/min/{1.73_m2} (ref >=60)

## 2021-04-10 NOTE — Telephone Encounter (Signed)
Per Sherron Ales, PA-C, okay to provide patient with sample of Otrexup 20 mg until she has labs in 2 weeks. If labs are stable will send in a prescription. Patient advised Nemiah Commander like to hold off on the Voltaren Gel refill at this time. She will reassess after her labs in 2 weeks. Patient expressed understanding.

## 2021-04-10 NOTE — Progress Notes (Signed)
BMP WNL

## 2021-04-10 NOTE — Telephone Encounter (Signed)
Next Visit: 08/21/2021  Last Visit: 03/20/2021  Last Fill: 05/13/2020  DX: Rheumatoid arthritis involving multiple sites with positive rheumatoid factor   Current Dose per office note 03/20/2021: to hold Rasuvo until we have recheck lab work in 2 weeks.  Labs: 03/19/2021 CBC WNL.  Creatinine is elevated-1.23 and GFR is 53.  Albumin remains low but is stable.  Rest of CMP WNL.  04/09/2021 BMP WNL  Okay to refill Rasuvo and Voltaren Gel?

## 2021-04-13 ENCOUNTER — Ambulatory Visit (INDEPENDENT_AMBULATORY_CARE_PROVIDER_SITE_OTHER): Payer: BC Managed Care – PPO | Admitting: Physician Assistant

## 2021-04-13 ENCOUNTER — Ambulatory Visit
Admission: EM | Admit: 2021-04-13 | Discharge: 2021-04-13 | Disposition: A | Discharge status: Home / Self Care | Payer: BC Managed Care – PPO | Attending: Physician Assistant | Admitting: Physician Assistant

## 2021-04-13 DIAGNOSIS — J101 Influenza due to other identified influenza virus with other respiratory manifestations: Secondary | ICD-10-CM

## 2021-04-13 DIAGNOSIS — M170 Bilateral primary osteoarthritis of knee: Secondary | ICD-10-CM

## 2021-04-13 LAB — POCT INFLUENZA A/B
Influenza A, POC: POSITIVE — AB
Influenza B, POC: NEGATIVE

## 2021-04-13 MED ORDER — OSELTAMIVIR PHOSPHATE 75 MG PO CAPS: 75.0000 mg | ORAL_CAPSULE | Freq: Two times a day (BID) | ORAL | 0 refills | Status: DC

## 2021-04-13 NOTE — ED Provider Notes (Signed)
[facility]    CSN: [id number] Arrival date & time: 04/13/21  1805      History   Chief Complaint Chief Complaint  Patient presents with   Sore Throat    HPI [name] [name] is a 55 y.o. female.   Patient here today for evaluation of sore throat congestion headache she has had for the last week.  She has tried over-the-counter medications without significant relief.  Husband tested positive for the flu today, and daughter and granddaughter tested positive for the flu recently as well.  The history is provided by the patient.  Sore Throat Pertinent negatives include no abdominal pain and no shortness of breath.   Past Medical History:  Diagnosis Date   Anxiety    Bursitis    Coronary artery calcification seen on CAT scan    minimal CAD with 30% prox and mild LAD   Depression    Diastolic dysfunction    Dyslipidemia 01/30/2015   Esophageal ring    Fibromyalgia    Hiatal hernia    HSV-1 infection    Morbid obesity (HCC)    Osteoarthritis    Rheumatoid arthritis(714.0)    M05.79   Rheumatoid arthritis, seropositive, 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HCC)    Treated with Orencia, TB neg 09/12/2015   Spondylolysis     Patient Active Problem List   Diagnosis Date Noted   Amenorrhea 06/09/2020   Anxiety 02/20/2018   Fatigue 02/20/2018   Low blood potassium 02/20/2018   Allergic rhinitis 12/14/2017   Fever blister 12/14/2017   Head congestion 12/14/2017   Hyperlipidemia 12/14/2017   Lateral epicondylitis of right elbow 05/04/2017   Sacral pain 03/23/2017   High risk medication use 08/03/2016   Fibromyalgia syndrome 08/03/2016   Abnormal cardiac CT angiography    Sacrococcygeal pain 02/20/2016   Rheumatoid arthritis involving 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HCC) 01/24/2016   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   Dyslipidemia 01/30/2015   MDD (major depressive disorder), recurrent severe, 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   Essential hypertension 08/25/2014   Morbid obesity  (HCC) 08/25/2014   CAD (coronary artery disease), native coronary artery 08/24/2014   Numbness and tingling in left arm    Arm numbness left 08/22/2014   Chest pain 08/22/2014   Gastroesophageal reflux disease without esophagitis 07/23/2014   Coronary artery calcification seen on CAT scan 02/19/2014   Hip pain 10/23/2013   Solitary pulmonary nodule 08/28/2013   Chest pain, atypical 07/18/2013   Heart palpitations 06/27/2013   Cough 06/04/2013   Diastolic dysfunction 05/18/2013   SOB (shortness of breath) 05/09/2013   Other malaise and fatigue 01/21/2013   Stress and adjustment reaction 12/09/2012   Right sided abdominal pain 11/29/2012   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/21/2012   Morbid obesity with BMI of 50.0-59.9, adult (HCC) 11/21/2012   Arthritis 07/13/2012   Depression 07/13/2012   Routine health maintenance 05/07/2012   Migraine 06/07/1998    Past Surgical History:  Procedure Laterality Date   CARDIAC CATHETERIZATION N/A 06/11/2016   Procedure: Left Heart Cath and Coronary Angiography;  Surgeon: Corky Crafts, MD;  Location: [facility] INVASIVE CV LAB;  Service: Cardiovascular;  Laterality: N/A;   CESAREAN SECTION     COLONOSCOPY  07/27/2017   ESOPHAGEAL MANOMETRY N/A 10/28/2014   Procedure: ESOPHAGEAL MANOMETRY (EM);  Surgeon: Hilarie Fredrickson, MD;  Location: [facility] ENDOSCOPY;  Service: Endoscopy;  Laterality: N/A;   HERNIA REPAIR     reports surgery on 3 hernias, with 2 more present   LAPAROSCOPIC GASTRIC  SLEEVE RESECTION  11/21/12   [facility]   LEFT HEART CATHETERIZATION WITH CORONARY ANGIOGRAM N/A 08/26/2014   Procedure: LEFT HEART CATHETERIZATION WITH CORONARY ANGIOGRAM;  Surgeon: Lorretta Harp, MD;  Location: [facility] CATH LAB;  Service: Cardiovascular;  Laterality: N/A;   TUBAL LIGATION      OB History   No obstetric history on file.      Home Medications    Prior to Admission medications   Medication Sig Start Date End Date Taking? Authorizing Provider   oseltamivir (TAMIFLU) 75 MG capsule Take 1 capsule (75 mg total) by mouth every 12 (twelve) hours. 04/13/21  Yes Francene Finders, PA-C  Abatacept (ORENCIA IV) Inject 1,000 mg into the vein every 28 (twenty-eight) days.    Bo Merino, MD  Acetaminophen-Codeine 300-30 MG tablet TAKE 1-2 TABLETS BY MOUTH EVERY 8 HOURS AS NEEDED. Patient not taking: Reported on 03/20/2021 06/11/20   Mcarthur Rossetti, MD  Adapalene 0.3 % gel Apply 1 application topically daily as needed (acne).  12/31/14   [provider]  Alcohol Swabs (ALCOHOL WIPES) 70 % PADS Alcohol Prep Pads    [provider]  atorvastatin (LIPITOR) 80 MG tablet Add'l Sig Add'l Sig oral Add'l Sig 10/10/19   [provider]  azelastine (ASTELIN) 0.1 % nasal spray Place 2 sprays into both nostrils 2 (two) times daily. 01/12/20   Tasia Catchings, Amy V, PA-C  buPROPion (WELLBUTRIN XL) 150 MG 24 hr tablet Take 450 mg by mouth every morning. 05/23/19   [provider]  cetirizine (ZYRTEC ALLERGY) 10 MG tablet Take 1 tablet (10 mg total) by mouth daily. 02/06/20   Hall-Potvin, [place], PA-C  cyclobenzaprine (FLEXERIL) 10 MG tablet TAKE ONE TABLET BY MOUTH EVERY NIGHT AT BEDTIME AS NEEDED FOR MUSCLE SPASMS 03/20/21   Ofilia Neas, PA-C  diazepam (VALIUM) 5 MG tablet Take 5 mg by mouth at bedtime. Patient not taking: Reported on 03/20/2021 05/05/20   [provider]  diclofenac Sodium (VOLTAREN) 1 % GEL Apply 2 grams to 4 grams to the affected area up to 4 times daily as needed. 09/05/20   Ofilia Neas, PA-C  diltiazem (CARDIZEM CD) 180 MG 24 hr capsule Take 1 capsule (180 mg total) by mouth daily. 01/23/21   Sueanne Margarita, MD  doxepin (SINEQUAN) 25 MG capsule Take 25 mg by mouth at bedtime as needed. 04/28/19   [provider]  EPINEPHrine 0.3 mg/0.3 mL IJ SOAJ injection epinephrine 0.3 mg/0.3 mL injection, auto-injector 07/02/19   [provider]  ezetimibe (ZETIA) 10 MG tablet Take 10 mg by  mouth daily. 10/22/19   [provider]  famciclovir (FAMVIR) 500 MG tablet Take 15,000 mg by mouth daily as needed (fever blister).  01/21/15   [provider]  fluticasone (FLONASE) 50 MCG/ACT nasal spray Place 1 spray into both nostrils daily. 02/06/20   Hall-Potvin, [place], PA-C  Folic Acid-Vit [id number] (FABB) 2.2-25-1 MG TABS Take 1 tablet by mouth daily. 03/20/21   Ofilia Neas, PA-C  gabapentin (NEURONTIN) 300 MG capsule TAKE 1 CAPSULE BY MOUTH 3 TIMES A DAY AS NEEDED FOR PAIN 03/20/21   Ofilia Neas, PA-C  hydrocortisone 2.5 % cream SMARTSIG:1 Topical Every Night 11/06/19   [provider]  ibuprofen (ADVIL) 800 MG tablet ibuprofen 800 mg tablet  TAKE 1 TABLET BY MOUTH THREE TIMES A DAY AS NEEDED Patient not taking: Reported on 03/20/2021    [provider]  ketoconazole (NIZORAL) 2 % cream  03/14/20   [provider]  ketoconazole (NIZORAL) 2 % shampoo  06/03/20   [provider]  levothyroxine (SYNTHROID) 25 MCG tablet levothyroxine 25 mcg tablet  TAKE 1 TABLET IN THE MORNING ON AN EMPTY STOMACH 30 MINUTES BEFORE FOOD    [provider]  lidocaine (LIDODERM) 5 % APPLY 1 PATCH TO AFFECTED AREA FOR 12 HOURS IN A 24 HOUR PERIOD 03/20/21   Ofilia Neas, PA-C  LORazepam (ATIVAN) 1 MG tablet Take 1 tablet (1 mg total) by mouth 3 (three) times daily as needed for anxiety. 10/09/19   Faustino Congress, NP  Methotrexate 10 MG/0.4ML SOSY 1 tab Patient not taking: Reported on 03/20/2021    [provider]  Methotrexate, PF, (RASUVO) 25 MG/0.5ML SOAJ INJECT ONE PEN SUBCUTANEOUSLY ONCE EVERY WEEK. STORE AT ROOM TEMPERATURE BETWEEN 68 - 77 DEGREES F. 05/13/20   Ofilia Neas, PA-C  nitroGLYCERIN (NITROSTAT) 0.4 MG SL tablet Place 1 tablet (0.4 mg total) under the tongue every 5 (five) minutes as needed for chest pain. 05/01/19   Burtis Junes, NP  [facility] powder Apply topically. 03/17/20   [provider]   Oxymetazoline HCl (NASAL SPRAY) 0.05 % SOLN Nasal Decongestant (oxymetazoline) 0.05 % spray    [provider]  predniSONE (DELTASONE) 5 MG tablet Take 4 tablets by mouth daily x4 days, 3 tablets daily x4 days, 2 tablets daily x4 days, 1 tablet daily x4 days. 03/20/21   Ofilia Neas, PA-C  propranolol (INDERAL) 10 MG tablet Take 10 mg by mouth daily.    [provider]  rosuvastatin (CRESTOR) 20 MG tablet Take 20 mg by mouth. 09/21/16 03/20/21  [provider]  Vitamin D, Ergocalciferol, (DRISDOL) 1.25 MG (50000 UT) CAPS capsule Take 50,000 Units by mouth See admin instructions. Take 1 tablet (50000 units) by mouth every Monday, Wednesday, Saturday    [provider]    Family History Family History  Problem Relation Age of Onset   Hyperlipidemia Mother    Depression Mother    Sarcoidosis Father    Lung disease Father        Pleural Mesothelioma   Cancer Father    Heart attack Paternal Grandmother    Hypertension Paternal Grandmother    Hypertension Maternal Grandmother    Diabetes Maternal Grandmother    Stroke Neg Hx    Colon cancer Neg Hx    Esophageal cancer Neg Hx    Rectal cancer Neg Hx    Stomach cancer Neg Hx     Social History Social History   Tobacco Use   Smoking status: Never   Smokeless tobacco: Never  Vaping Use   Vaping Use: Never used  Substance Use Topics   Alcohol use: No   Drug use: No     Allergies   Influenza vaccines, Amitriptyline, and Isosorbide   Review of Systems Review of Systems  Constitutional:  Negative for chills and fever.  HENT:  Positive for congestion, ear pain, sinus pressure and sore throat.   Eyes:  Negative for discharge and redness.  Respiratory:  Positive for cough. Negative for shortness of breath and wheezing.   Gastrointestinal:  Negative for abdominal pain, diarrhea, nausea and vomiting.    Physical Exam Triage Vital Signs ED Triage Vitals  Enc Vitals Group     BP 04/13/21  1851 (!) 164/88     Pulse Rate 04/13/21 1851 73     Resp 04/13/21 1851 18     Temp 04/13/21 1851 98  F (36.7 C)     Temp Source 04/13/21 1851 Oral     SpO2 04/13/21 1851 98 %     Weight --      Height --      Head Circumference --      Peak Flow --      Pain Score 04/13/21 1841 0     Pain Loc --      Pain Edu? --      Excl. in [place]? --    No data found.  Updated Vital Signs BP (!) 164/88 (BP Location: Right Arm)   Pulse 73   Temp 98 F (36.7 C) (Oral)   Resp 18   SpO2 98%      Physical Exam Vitals and nursing note reviewed.  Constitutional:      General: She is not in acute distress.    Appearance: Normal appearance. She is not ill-appearing.  HENT:     Head: Normocephalic and atraumatic.     Right Ear: Tympanic membrane normal.     Left Ear: Tympanic membrane normal.     Nose: Congestion present.     Mouth/Throat:     Mouth: Mucous membranes are moist.     Pharynx: No oropharyngeal exudate or posterior oropharyngeal erythema.  Eyes:     Conjunctiva/sclera: Conjunctivae normal.  Cardiovascular:     Rate and Rhythm: Normal rate and regular rhythm.     Heart sounds: Normal heart sounds. No murmur heard. Pulmonary:     Effort: Pulmonary effort is normal. No respiratory distress.     Breath sounds: Normal breath sounds. No wheezing, rhonchi or rales.  Skin:    General: Skin is warm and dry.  Neurological:     Mental Status: She is alert.  Psychiatric:        Mood and Affect: Mood normal.        Thought Content: Thought content normal.     UC Treatments / Results  Labs (all labs ordered are listed, but only abnormal results are displayed) Labs Reviewed  POCT INFLUENZA A/B - Abnormal; Notable for the following components:      Result Value   Influenza A, POC Positive (*)    All other components within normal limits    EKG   Radiology No results found.  Procedures Procedures (including critical care time)  Medications Ordered in UC Medications - No  data to display  Initial Impression / Assessment and Plan / UC Course  I have reviewed the triage vital signs and the nursing notes.  Pertinent labs & imaging results that were available during my care of the patient were reviewed by me and considered in my medical decision making (see chart for details).  Tamiflu prescribed for influenza.  Recommend follow-up with any further concerns.  Continue symptomatic treatment otherwise.   Final Clinical Impressions(s) / UC Diagnoses   Final diagnoses:  Influenza A   Discharge Instructions   None    ED Prescriptions     Medication Sig Dispense Auth. Provider   oseltamivir (TAMIFLU) 75 MG capsule Take 1 capsule (75 mg total) by mouth every 12 (twelve) hours. 10 capsule Francene Finders, PA-C      PDMP not reviewed this encounter.   Francene Finders, PA-C 04/13/21 1913

## 2021-04-13 NOTE — ED Triage Notes (Signed)
Pt c/o sore throat, congestion, and headache x1wk. States taking OTC meds with little relief.

## 2021-04-15 ENCOUNTER — Telehealth: Payer: BC Managed Care – PPO | Admitting: Family Medicine

## 2021-04-15 DIAGNOSIS — R051 Acute cough: Secondary | ICD-10-CM

## 2021-04-15 DIAGNOSIS — J111 Influenza due to unidentified influenza virus with other respiratory manifestations: Secondary | ICD-10-CM

## 2021-04-15 DIAGNOSIS — G43909 Migraine, unspecified, not intractable, without status migrainosus: Secondary | ICD-10-CM

## 2021-04-15 MED ORDER — BENZONATATE 100 MG PO CAPS: 100.0000 mg | ORAL_CAPSULE | Freq: Two times a day (BID) | ORAL | 0 refills | Status: DC | PRN

## 2021-04-15 MED ORDER — PROMETHAZINE-DM 6.25-15 MG/5ML PO SYRP: 2.5000 mL | ORAL_SOLUTION | Freq: Three times a day (TID) | ORAL | 0 refills | Status: DC | PRN

## 2021-04-15 MED ORDER — RIZATRIPTAN BENZOATE 10 MG PO TABS: 10.0000 mg | ORAL_TABLET | ORAL | 0 refills | Status: DC | PRN

## 2021-04-15 NOTE — Patient Instructions (Addendum)
Tylenol 1,000 mg every 6 hours with a max of 4,000 mg a day Mortin 600-800 mg every 8 hours, with a max of 800mg  at a time.  Try the Maxalt for current migraine, but it might not work, as the flu is causing it.  Hydrate and rest well.  Remember the cough syrup might make you sleep.   I hope you feel better soon!

## 2021-04-15 NOTE — Progress Notes (Signed)
Virtual Visit Consent   [name] [name], you are scheduled for a virtual visit with a [facility] provider today.     Just as with appointments in the office, your consent must be obtained to participate.  Your consent will be active for this visit and any virtual visit you may have with one of our providers in the next 365 days.     If you have a MyChart account, a copy of this consent can be sent to you electronically.  All virtual visits are billed to your insurance company just like a traditional visit in the office.    As this is a virtual visit, video technology does not allow for your provider to perform a traditional examination.  This may limit your provider's ability to fully assess your condition.  If your provider identifies any concerns that need to be evaluated in person or the need to arrange testing (such as labs, EKG, etc.), we will make arrangements to do so.     Although advances in technology are sophisticated, we cannot ensure that it will always work on either your end or our end.  If the connection with a video visit is poor, the visit may have to be switched to a telephone visit.  With either a video or telephone visit, we are not always able to ensure that we have a secure connection.     I need to obtain your verbal consent now.   Are you willing to proceed with your visit today?    [name] [name] has provided verbal consent on 04/15/2021 for a virtual visit (video or telephone).   Perlie Mayo, NP   Date: 04/15/2021 8:19 AM   Virtual Visit via Video Note   I, Perlie Mayo, connected with  [name] [name]  ([id number], [birth year]) on 04/15/21 at  8:15 AM EST by a video-enabled telemedicine application and verified that I am speaking with the correct person using two identifiers.  Location: Patient: Virtual Visit Location Patient: Home Provider: Virtual Visit Location Provider: Home Office   I discussed the limitations of evaluation and  management by telemedicine and the availability of in person appointments. The patient expressed understanding and agreed to proceed.    History of Present Illness: [name] [name] is a 55 y.o. who identifies as a female who was assigned female at birth, and is being seen today for headache that is related to flu. She was seen at standing UC on 11/7 and tested + for the flu. She reports taking Tamiflu but her head will not stop hurting even with medication use: motrin and tylenol (400 mg and 1,000 mg) taken in rotation. She was taking tylenol 1000 mg every 4 hours (educated on not taking this much). She reports hx of migraines. She has a bad cough and would like perles and cough syrup if possible. Overall just feels bad.  Reports greenish cloudy mucus production.  Denies chest pain, shortness of breath, ear pain.   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   Dyslipidemia 01/30/2015   MDD (major depressive disorder), recurrent severe, 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   Essential hypertension 08/25/2014   Morbid obesity (HCC) 08/25/2014   CAD (coronary artery disease), native coronary artery 08/24/2014   Numbness and tingling in left arm    Arm numbness left 08/22/2014   Chest pain 08/22/2014   Gastroesophageal reflux disease without esophagitis 07/23/2014   Coronary artery calcification seen on CAT scan 02/19/2014   Hip pain  10/23/2013   Solitary pulmonary nodule 08/28/2013   Chest pain, atypical 07/18/2013   Heart palpitations 06/27/2013   Cough 06/04/2013   Diastolic dysfunction 05/18/2013   SOB (shortness of breath) 05/09/2013   Other malaise and fatigue 01/21/2013   Stress and adjustment reaction 12/09/2012   Right sided abdominal pain 11/29/2012   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/21/2012   Morbid obesity with BMI of 50.0-59.9, adult (HCC) 11/21/2012   Arthritis 07/13/2012   Depression 07/13/2012   Routine health maintenance 05/07/2012   Migraine 06/07/1998    Allergies:  Allergies  Allergen Reactions   Influenza Vaccines Anaphylaxis   Amitriptyline    Isosorbide     Severe headaches   Medications:  Current Outpatient Medications:    Abatacept (ORENCIA IV), Inject 1,000 mg into the vein every 28 (twenty-eight) days., Disp: , Rfl:    Acetaminophen-Codeine 300-30 MG tablet, TAKE 1-2 TABLETS BY MOUTH EVERY 8 HOURS AS NEEDED. (Patient not taking: Reported on 03/20/2021), Disp: 30 tablet, Rfl: 0   Adapalene 0.3 % gel, Apply 1 application topically daily as needed (acne). , Disp: , Rfl:    Alcohol Swabs (ALCOHOL WIPES) 70 % PADS, Alcohol Prep Pads, Disp: , Rfl:    atorvastatin (LIPITOR) 80 MG tablet, Add'l Sig Add'l Sig oral Add'l Sig, Disp: , Rfl:    azelastine (ASTELIN) 0.1 % nasal spray, Place 2 sprays into both nostrils 2 (two) times daily., Disp: 30 mL, Rfl: 0   buPROPion (WELLBUTRIN XL) 150 MG 24 hr tablet, Take 450 mg by mouth every morning., Disp: , Rfl:    cetirizine (ZYRTEC ALLERGY) 10 MG tablet, Take 1 tablet (10 mg total) by mouth daily., Disp: 30 tablet, Rfl: 0   cyclobenzaprine (FLEXERIL) 10 MG tablet, TAKE ONE TABLET BY MOUTH EVERY NIGHT AT BEDTIME AS NEEDED FOR MUSCLE SPASMS, Disp: 30 tablet, Rfl: 0   diazepam (VALIUM) 5 MG tablet, Take 5 mg by mouth at bedtime. (Patient not taking: Reported on 03/20/2021), Disp: , Rfl:    diclofenac Sodium (VOLTAREN) 1 % GEL, Apply 2  grams to 4 grams to the affected area up to 4 times daily as needed., Disp: 400 g, Rfl: 4   diltiazem (CARDIZEM CD) 180 MG 24 hr capsule, Take 1 capsule (180 mg total) by mouth daily., Disp: 15 capsule, Rfl: 0   doxepin (SINEQUAN) 25 MG capsule, Take 25 mg by mouth at bedtime as needed., Disp: , Rfl:    EPINEPHrine 0.3 mg/0.3 mL IJ SOAJ injection, epinephrine 0.3 mg/0.3 mL injection, auto-injector, Disp: , Rfl:    ezetimibe (ZETIA) 10 MG tablet, Take 10 mg by mouth daily., Disp: , Rfl:    famciclovir (FAMVIR) 500 MG tablet, Take 15,000 mg by mouth daily as needed (fever blister). , Disp: , Rfl: 11   fluticasone (FLONASE) 50 MCG/ACT nasal spray, Place 1 spray into both nostrils daily., Disp: 16 g, Rfl: 0   Folic Acid-Vit B6-Vit B12 (FABB) 2.2-25-1 MG TABS, Take 1  tablet by mouth daily., Disp: 180 tablet, Rfl: 3   gabapentin (NEURONTIN) 300 MG capsule, TAKE 1 CAPSULE BY MOUTH 3 TIMES A DAY AS NEEDED FOR PAIN, Disp: 270 capsule, Rfl: 0   hydrocortisone 2.5 % cream, SMARTSIG:1 Topical Every Night, Disp: , Rfl:    ibuprofen (ADVIL) 800 MG tablet, ibuprofen 800 mg tablet  TAKE 1 TABLET BY MOUTH THREE TIMES A DAY AS NEEDED (Patient not taking: Reported on 03/20/2021), Disp: , Rfl:    ketoconazole (NIZORAL) 2 % cream, , Disp: , Rfl:    ketoconazole (NIZORAL) 2 % shampoo, , Disp: , Rfl:    levothyroxine (SYNTHROID) 25 MCG tablet, levothyroxine 25 mcg tablet  TAKE 1 TABLET IN THE MORNING ON AN EMPTY STOMACH 30 MINUTES BEFORE FOOD, Disp: , Rfl:    lidocaine (LIDODERM) 5 %, APPLY 1 PATCH TO AFFECTED AREA FOR 12 HOURS IN A 24 HOUR PERIOD, Disp: 30 patch, Rfl: 2   LORazepam (ATIVAN) 1 MG tablet, Take 1 tablet (1 mg total) by mouth 3 (three) times daily as needed for anxiety., Disp: 15 tablet, Rfl: 0   Methotrexate 10 MG/0.4ML SOSY, 1 tab (Patient not taking: Reported on 03/20/2021), Disp: , Rfl:    Methotrexate, PF, (RASUVO) 25 MG/0.5ML SOAJ, INJECT ONE PEN SUBCUTANEOUSLY ONCE EVERY WEEK. STORE AT ROOM  TEMPERATURE BETWEEN 68 - 39 DEGREES F., Disp: 6 mL, Rfl: 0   nitroGLYCERIN (NITROSTAT) 0.4 MG SL tablet, Place 1 tablet (0.4 mg total) under the tongue every 5 (five) minutes as needed for chest pain., Disp: 25 tablet, Rfl: 3   NYAMYC powder, Apply topically., Disp: , Rfl:    oseltamivir (TAMIFLU) 75 MG capsule, Take 1 capsule (75 mg total) by mouth every 12 (twelve) hours., Disp: 10 capsule, Rfl: 0   Oxymetazoline HCl (NASAL SPRAY) 0.05 % SOLN, Nasal Decongestant (oxymetazoline) 0.05 % spray, Disp: , Rfl:    predniSONE (DELTASONE) 5 MG tablet, Take 4 tablets by mouth daily x4 days, 3 tablets daily x4 days, 2 tablets daily x4 days, 1 tablet daily x4 days., Disp: 40 tablet, Rfl: 0   propranolol (INDERAL) 10 MG tablet, Take 10 mg by mouth daily., Disp: , Rfl:    rosuvastatin (CRESTOR) 20 MG tablet, Take 20 mg by mouth., Disp: , Rfl:    Vitamin D, Ergocalciferol, (DRISDOL) 1.25 MG (50000 UT) CAPS capsule, Take 50,000 Units by mouth See admin instructions. Take 1 tablet (50000 units) by mouth every Monday, Wednesday, Saturday, Disp: , Rfl:   Observations/Objective: Patient is well-developed, well-nourished in no acute distress.  Resting comfortably  at home.  Head is normocephalic, atraumatic.  No labored breathing.  Speech is clear and coherent with logical content.  Patient is alert and oriented at baseline.    Assessment and Plan: 1. Influenza OTC measures discussed on AVS  2. Migraine without status migrainosus, not intractable, unspecified migraine type Related to flu, but will try an abortive medication Educated on proper doses of tylenol and motrin on AVS  - rizatriptan (MAXALT) 10 MG tablet; Take 1 tablet (10 mg total) by mouth as needed for migraine. May repeat in 2 hours if needed  Dispense: 10 tablet; Refill: 0  3. Acute cough Second worsen symptom- perles and prometh DM provided  - benzonatate (TESSALON) 100 MG capsule; Take 1 capsule (100 mg total) by mouth 2 (two) times  daily as needed for cough.  Dispense: 20 capsule; Refill: 0 - promethazine-dextromethorphan (PROMETHAZINE-DM) 6.25-15 MG/5ML syrup; Take 2.5 mLs by mouth 3 (three) times daily as needed  for cough.  Dispense: 118 mL; Refill: 0   Reviewed side effects, risks and benefits of medication.    Patient acknowledged agreement and understanding of the plan.  I discussed the assessment and treatment plan with the patient. The patient was provided an opportunity to ask questions and all were answered. The patient agreed with the plan and demonstrated an understanding of the instructions.   The patient was advised to call back or seek an in-person evaluation if the symptoms worsen or if the condition fails to improve as anticipated.   The above assessment and management plan was discussed with the patient. The patient verbalized understanding of and has agreed to the management plan. Patient is aware to call the clinic if symptoms persist or worsen. Patient is aware when to return to the clinic for a follow-up visit. Patient educated on when it is appropriate to go to the emergency department.    Follow Up Instructions: I discussed the assessment and treatment plan with the patient. The patient was provided an opportunity to ask questions and all were answered. The patient agreed with the plan and demonstrated an understanding of the instructions.  A copy of instructions were sent to the patient via MyChart unless otherwise noted below.    The patient was advised to call back or seek an in-person evaluation if the symptoms worsen or if the condition fails to improve as anticipated.  Time:  I spent 10 minutes with the patient via telehealth technology discussing the above problems/concerns.    Perlie Mayo, NP

## 2021-04-16 ENCOUNTER — Inpatient Hospital Stay (HOSPITAL_COMMUNITY): Admission: RE | Admit: 2021-04-16 | Payer: BC Managed Care – PPO | Source: Ambulatory Visit

## 2021-04-20 ENCOUNTER — Other Ambulatory Visit: Payer: Self-pay

## 2021-04-20 ENCOUNTER — Ambulatory Visit (INDEPENDENT_AMBULATORY_CARE_PROVIDER_SITE_OTHER): Payer: BC Managed Care – PPO | Admitting: Physician Assistant

## 2021-04-20 DIAGNOSIS — M1711 Unilateral primary osteoarthritis, right knee: Secondary | ICD-10-CM

## 2021-04-20 MED ORDER — LIDOCAINE HCL 1 % IJ SOLN
1.5000 mL | INTRAMUSCULAR | Status: AC | PRN
Start: 2021-04-20 — End: 2021-04-20
  Administered 2021-04-20: 1.5 mL via INTRA_ARTICULAR

## 2021-04-20 MED ORDER — DICLOFENAC SODIUM 1 % EX GEL: CUTANEOUS | 2 refills | Status: DC

## 2021-04-20 MED ORDER — SODIUM HYALURONATE (VISCOSUP) 20 MG/2ML IX SOSY
20.0000 mg | PREFILLED_SYRINGE | INTRA_ARTICULAR | Status: AC | PRN
Start: 2021-04-20 — End: 2021-04-20
  Administered 2021-04-20: 20 mg via INTRA_ARTICULAR

## 2021-04-20 NOTE — Telephone Encounter (Signed)
Medication Samples have been provided to the patient.  Drug name: Otrexup       Strength: 20mg         Qty: 2  LOT  Exp.Date: 10/04/2021  Dosing instructions: Inject 20mg  into the skin once weekly.

## 2021-04-20 NOTE — Progress Notes (Signed)
   Procedure Note  Patient: [name] [name]             Date of Birth: [birth year]           MRN: [id number]             Visit Date: 04/20/2021  Procedures: Visit Diagnoses:  1. Primary osteoarthritis of right knee     Euflexxa #1 right knee, SAMPLES (P/P) Large Joint Inj: R knee on 04/20/2021 11:44 AM Indications: pain Details: 27 G 1.5 in needle, medial approach  Arthrogram: No  Medications: 1.5 mL lidocaine 1 %; 20 mg Sodium Hyaluronate 20 MG/2ML Aspirate: 0 mL Outcome: tolerated well, no immediate complications Procedure, treatment alternatives, risks and benefits explained, specific risks discussed. Consent was given by the patient. Immediately prior to procedure a time out was called to verify the correct patient, procedure, equipment, support staff and site/side marked as required. Patient was prepped and draped in the usual sterile fashion.    Patient tolerated the procedure well.  Aftercare was discussed.  Sherron Ales, PA-C

## 2021-04-20 NOTE — Telephone Encounter (Signed)
Voltaren gel refill pended. Please review and send to the pharmacy.

## 2021-04-26 ENCOUNTER — Telehealth: Payer: Self-pay | Admitting: Emergency Medicine

## 2021-04-26 DIAGNOSIS — J0140 Acute pansinusitis, unspecified: Secondary | ICD-10-CM

## 2021-04-26 MED ORDER — FLUCONAZOLE 150 MG PO TABS: ORAL_TABLET | ORAL | 0 refills | Status: DC

## 2021-04-26 MED ORDER — DOXYCYCLINE HYCLATE 100 MG PO TABS: 100.0000 mg | ORAL_TABLET | Freq: Two times a day (BID) | ORAL | 0 refills | Status: AC

## 2021-04-26 NOTE — Patient Instructions (Signed)
[name] [name], thank you for joining [name], PA-C for today's virtual visit.  While this provider is not your primary care provider (PCP), if your PCP is located in our provider database this encounter information will be shared with them immediately following your visit.  Consent: (Patient) [name] [name] provided verbal consent for this virtual visit at the beginning of the encounter.  Current Medications:  Current Outpatient Medications:    doxycycline (VIBRA-TABS) 100 MG tablet, Take 1 tablet (100 mg total) by mouth 2 (two) times daily for 10 days., Disp: 20 tablet, Rfl: 0   fluconazole (DIFLUCAN) 150 MG tablet, Take one dose by mouth, wait 72 hours, and then take second dose by mouth, Disp: 2 tablet, Rfl: 0   Abatacept (ORENCIA IV), Inject 1,000 mg into the vein every 28 (twenty-eight) days., Disp: , Rfl:    Acetaminophen-Codeine 300-30 MG tablet, TAKE 1-2 TABLETS BY MOUTH EVERY 8 HOURS AS NEEDED. (Patient not taking: Reported on 03/20/2021), Disp: 30 tablet, Rfl: 0   Adapalene 0.3 % gel, Apply 1 application topically daily as needed (acne). , Disp: , Rfl:    Alcohol Swabs (ALCOHOL WIPES) 70 % PADS, Alcohol Prep Pads, Disp: , Rfl:    atorvastatin (LIPITOR) 80 MG tablet, Add'l Sig Add'l Sig oral Add'l Sig, Disp: , Rfl:    azelastine (ASTELIN) 0.1 % nasal spray, Place 2 sprays into both nostrils 2 (two) times daily., Disp: 30 mL, Rfl: 0   benzonatate (TESSALON) 100 MG capsule, Take 1 capsule (100 mg total) by mouth 2 (two) times daily as needed for cough., Disp: 20 capsule, Rfl: 0   buPROPion (WELLBUTRIN XL) 150 MG 24 hr tablet, Take 450 mg by mouth every morning., Disp: , Rfl:    cetirizine (ZYRTEC ALLERGY) 10 MG tablet, Take 1 tablet (10 mg total) by mouth daily., Disp: 30 tablet, Rfl: 0   cyclobenzaprine (FLEXERIL) 10 MG tablet, TAKE ONE TABLET BY MOUTH EVERY NIGHT AT BEDTIME AS NEEDED FOR MUSCLE SPASMS, Disp: 30 tablet, Rfl: 0   diazepam (VALIUM) 5 MG tablet, Take 5  mg by mouth at bedtime. (Patient not taking: Reported on 03/20/2021), Disp: , Rfl:    diclofenac Sodium (VOLTAREN) 1 % GEL, Apply 2-4 grams to affected joint 4 times daily as needed., Disp: 400 g, Rfl: 2   diltiazem (CARDIZEM CD) 180 MG 24 hr capsule, Take 1 capsule (180 mg total) by mouth daily., Disp: 15 capsule, Rfl: 0   doxepin (SINEQUAN) 25 MG capsule, Take 25 mg by mouth at bedtime as needed., Disp: , Rfl:    EPINEPHrine 0.3 mg/0.3 mL IJ SOAJ injection, epinephrine 0.3 mg/0.3 mL injection, auto-injector, Disp: , Rfl:    ezetimibe (ZETIA) 10 MG tablet, Take 10 mg by mouth daily., Disp: , Rfl:    famciclovir (FAMVIR) 500 MG tablet, Take 15,000 mg by mouth daily as needed (fever blister). , Disp: , Rfl: 11   fluticasone (FLONASE) 50 MCG/ACT nasal spray, Place 1 spray into both nostrils daily., Disp: 16 g, Rfl: 0   Folic Acid-Vit B6-Vit B12 (FABB) 2.2-25-1 MG TABS, Take 1 tablet by mouth daily., Disp: 180 tablet, Rfl: 3   gabapentin (NEURONTIN) 300 MG capsule, TAKE 1 CAPSULE BY MOUTH 3 TIMES A DAY AS NEEDED FOR PAIN, Disp: 270 capsule, Rfl: 0   hydrocortisone 2.5 % cream, SMARTSIG:1 Topical Every Night, Disp: , Rfl:    ibuprofen (ADVIL) 800 MG tablet, ibuprofen 800 mg tablet  TAKE 1 TABLET BY MOUTH THREE TIMES A DAY AS NEEDED (  Patient not taking: Reported on 03/20/2021), Disp: , Rfl:    ketoconazole (NIZORAL) 2 % cream, , Disp: , Rfl:    ketoconazole (NIZORAL) 2 % shampoo, , Disp: , Rfl:    levothyroxine (SYNTHROID) 25 MCG tablet, levothyroxine 25 mcg tablet  TAKE 1 TABLET IN THE MORNING ON AN EMPTY STOMACH 30 MINUTES BEFORE FOOD, Disp: , Rfl:    lidocaine (LIDODERM) 5 %, APPLY 1 PATCH TO AFFECTED AREA FOR 12 HOURS IN A 24 HOUR PERIOD, Disp: 30 patch, Rfl: 2   LORazepam (ATIVAN) 1 MG tablet, Take 1 tablet (1 mg total) by mouth 3 (three) times daily as needed for anxiety., Disp: 15 tablet, Rfl: 0   Methotrexate 10 MG/0.4ML SOSY, 1 tab (Patient not taking: Reported on 03/20/2021), Disp: , Rfl:     Methotrexate, PF, (RASUVO) 25 MG/0.5ML SOAJ, INJECT ONE PEN SUBCUTANEOUSLY ONCE EVERY WEEK. STORE AT ROOM TEMPERATURE BETWEEN 68 - 77 DEGREES F., Disp: 6 mL, Rfl: 0   nitroGLYCERIN (NITROSTAT) 0.4 MG SL tablet, Place 1 tablet (0.4 mg total) under the tongue every 5 (five) minutes as needed for chest pain., Disp: 25 tablet, Rfl: 3   NYAMYC powder, Apply topically., Disp: , Rfl:    oseltamivir (TAMIFLU) 75 MG capsule, Take 1 capsule (75 mg total) by mouth every 12 (twelve) hours., Disp: 10 capsule, Rfl: 0   Oxymetazoline HCl (NASAL SPRAY) 0.05 % SOLN, Nasal Decongestant (oxymetazoline) 0.05 % spray, Disp: , Rfl:    predniSONE (DELTASONE) 5 MG tablet, Take 4 tablets by mouth daily x4 days, 3 tablets daily x4 days, 2 tablets daily x4 days, 1 tablet daily x4 days., Disp: 40 tablet, Rfl: 0   promethazine-dextromethorphan (PROMETHAZINE-DM) 6.25-15 MG/5ML syrup, Take 2.5 mLs by mouth 3 (three) times daily as needed for cough., Disp: 118 mL, Rfl: 0   propranolol (INDERAL) 10 MG tablet, Take 10 mg by mouth daily., Disp: , Rfl:    rizatriptan (MAXALT) 10 MG tablet, Take 1 tablet (10 mg total) by mouth as needed for migraine. May repeat in 2 hours if needed, Disp: 10 tablet, Rfl: 0   rosuvastatin (CRESTOR) 20 MG tablet, Take 20 mg by mouth., Disp: , Rfl:    Vitamin D, Ergocalciferol, (DRISDOL) 1.25 MG (50000 UT) CAPS capsule, Take 50,000 Units by mouth See admin instructions. Take 1 tablet (50000 units) by mouth every Monday, Wednesday, Saturday, Disp: , Rfl:    Medications ordered in this encounter:  Meds ordered this encounter  Medications   fluconazole (DIFLUCAN) 150 MG tablet    Sig: Take one dose by mouth, wait 72 hours, and then take second dose by mouth    Dispense:  2 tablet    Refill:  0    Order Specific Question:   Supervising Provider    Answer:   Hyacinth Meeker, BRIAN [[id number]]   doxycycline (VIBRA-TABS) 100 MG tablet    Sig: Take 1 tablet (100 mg total) by mouth 2 (two) times daily for 10 days.     Dispense:  20 tablet    Refill:  0    Order Specific Question:   Supervising Provider    Answer:   Hyacinth Meeker, BRIAN [[id number]]     *If you need refills on other medications prior to your next appointment, please contact your pharmacy*  Follow-Up: Call back or seek an in-person evaluation if the symptoms worsen or if the condition fails to improve as anticipated.  Other Instructions Get plenty of rest and push fluids Prescribed doxycycline for sinus infection (I decided to  change from augentin due to possible interaction between augmentin and methotrexate)  Prescribed diflucan for possible yeast infection secondary to antibiotic use Use OTC zyrtec for nasal congestion, runny nose, and/or sore throat Use OTC flonase for nasal congestion and runny nose Use medications daily for symptom relief Use OTC medications like ibuprofen or tylenol as needed fever or pain Follow up in person with urgent care or go to the ED if you have any new or worsening symptoms such as fever, worsening cough, shortness of breath, chest tightness, chest pain, turning blue, changes in mental status, etc...    If you have been instructed to have an in-person evaluation today at a local Urgent Care facility, please use the link below. It will take you to a list of all of our available [facility], including address, phone number and hours of operation. Please do not delay care.  [facility]  If you or a family member do not have a primary care provider, use the link below to schedule a visit and establish care. When you choose a [facility] primary care physician or advanced practice provider, you gain a long-term partner in health. Find a Primary Care Provider  Learn more about [facility]'s in-office and virtual care options: [facility] - Get Care Now

## 2021-04-26 NOTE — Progress Notes (Signed)
Virtual Visit Consent   [name] [name], you are scheduled for a virtual visit with a  provider today.     Just as with appointments in the office, your consent must be obtained to participate.  Your consent will be active for this visit and any virtual visit you may have with one of our providers in the next 365 days.     If you have a MyChart account, a copy of this consent can be sent to you electronically.  All virtual visits are billed to your insurance company just like a traditional visit in the office.    As this is a virtual visit, video technology does not allow for your provider to perform a traditional examination.  This may limit your provider's ability to fully assess your condition.  If your provider identifies any concerns that need to be evaluated in person or the need to arrange testing (such as labs, EKG, etc.), we will make arrangements to do so.     Although advances in technology are sophisticated, we cannot ensure that it will always work on either your end or our end.  If the connection with a video visit is poor, the visit may have to be switched to a telephone visit.  With either a video or telephone visit, we are not always able to ensure that we have a secure connection.     I need to obtain your verbal consent now.   Are you willing to proceed with your visit today?    [name] has provided verbal consent on 04/26/2021 for a virtual visit (video or telephone).   [name], [place]   Date: 04/26/2021 9:09 AM   Virtual Visit via Video Note   I, [name], connected with  JAKELYN SQUYRES  ([id number], February 01, 1966) on 04/26/21 at  9:00 AM EST by a video-enabled telemedicine application and verified that I am speaking with the correct person using two identifiers.  Location: Patient: Virtual Visit Location Patient: Home Provider: Virtual Visit Location Provider: Home Office   I discussed the limitations of evaluation and  management by telemedicine and the availability of in person appointments. The patient expressed understanding and agreed to proceed.    History of Present Illness: [name] [name] is a 55 y.o. who identifies as a female who was assigned female at birth, and is being seen today for sinus pain/ pressure x 2 weeks, worse over the past day.  Was recently dx'ed and treated for the flu 2 weeks ago.  Has tried OTC medications without relief.  Symptoms are made worse with laying down.  Reports previous symptoms in the past with sinus infection. Complains of associated runny nose, PND, sore throat, and cough.  Denies fever, chills, SOB, wheezing, chest pain, nausea, changes in bowel or bladder habits.    ROS: As per HPI.  All other pertinent ROS negative.     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   Dyslipidemia 01/30/2015   MDD (major depressive disorder), recurrent severe, 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   Essential hypertension 08/25/2014   Morbid obesity (HCC) 08/25/2014   CAD (coronary artery disease), native coronary artery 08/24/2014   Numbness and tingling in left arm    Arm numbness left 08/22/2014   Chest pain 08/22/2014   Gastroesophageal reflux disease without esophagitis 07/23/2014   Coronary artery calcification seen on CAT scan 02/19/2014   Hip pain 10/23/2013   Solitary pulmonary nodule 08/28/2013   Chest  pain, atypical 07/18/2013   Heart palpitations 06/27/2013   Cough 06/04/2013   Diastolic dysfunction 05/18/2013   SOB (shortness of breath) 05/09/2013   Other malaise and fatigue 01/21/2013   Stress and adjustment reaction 12/09/2012   Right sided abdominal pain 11/29/2012   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/21/2012   Morbid obesity with BMI of 50.0-59.9, adult (HCC) 11/21/2012   Arthritis 07/13/2012   Depression 07/13/2012   Routine health maintenance 05/07/2012   Migraine 06/07/1998    Allergies:  Allergies  Allergen Reactions   Influenza Vaccines Anaphylaxis   Amitriptyline    Isosorbide     Severe headaches   Medications:  Current Outpatient Medications:    doxycycline (VIBRA-TABS) 100 MG tablet, Take 1 tablet (100 mg total) by mouth 2 (two) times daily for 10 days., Disp: 20 tablet, Rfl: 0   fluconazole (DIFLUCAN) 150 MG tablet, Take one dose by mouth, wait 72 hours, and then take second dose by mouth, Disp: 2 tablet, Rfl: 0   Abatacept (ORENCIA IV), Inject 1,000 mg into the vein every 28 (twenty-eight) days., Disp: , Rfl:    Acetaminophen-Codeine 300-30 MG tablet, TAKE 1-2 TABLETS BY MOUTH EVERY 8 HOURS AS NEEDED. (Patient not taking: Reported on 03/20/2021), Disp: 30 tablet, Rfl: 0   Adapalene 0.3 % gel, Apply 1 application topically daily as needed (acne). , Disp: , Rfl:    Alcohol Swabs (ALCOHOL WIPES) 70 % PADS, Alcohol Prep Pads, Disp: , Rfl:    atorvastatin (LIPITOR) 80 MG tablet, Add'l Sig Add'l Sig oral Add'l Sig, Disp: , Rfl:    azelastine (ASTELIN) 0.1 % nasal spray, Place 2 sprays into both nostrils 2 (two) times daily., Disp: 30 mL, Rfl: 0   benzonatate (TESSALON) 100 MG capsule, Take 1 capsule (100 mg total) by mouth 2 (two) times daily as needed for cough., Disp: 20 capsule, Rfl: 0   buPROPion (WELLBUTRIN XL) 150 MG 24 hr tablet, Take 450 mg by mouth every morning., Disp: , Rfl:    cetirizine (ZYRTEC ALLERGY) 10 MG tablet, Take 1 tablet (10 mg  total) by mouth daily., Disp: 30 tablet, Rfl: 0   cyclobenzaprine (FLEXERIL) 10 MG tablet, TAKE ONE TABLET BY MOUTH EVERY NIGHT AT BEDTIME AS NEEDED FOR MUSCLE SPASMS, Disp: 30 tablet, Rfl: 0   diazepam (VALIUM) 5 MG tablet, Take 5 mg by mouth at bedtime. (Patient not taking: Reported on 03/20/2021), Disp: , Rfl:    diclofenac Sodium (VOLTAREN) 1 % GEL, Apply 2-4 grams to affected joint 4 times daily as needed., Disp: 400 g, Rfl: 2   diltiazem (CARDIZEM CD) 180 MG 24 hr capsule, Take 1 capsule (180 mg total) by mouth daily., Disp: 15 capsule, Rfl: 0   doxepin (SINEQUAN) 25 MG capsule, Take 25 mg by mouth at bedtime as needed., Disp: , Rfl:    EPINEPHrine 0.3 mg/0.3 mL IJ SOAJ injection, epinephrine 0.3 mg/0.3 mL injection, auto-injector, Disp: , Rfl:    ezetimibe (ZETIA) 10  MG tablet, Take 10 mg by mouth daily., Disp: , Rfl:    famciclovir (FAMVIR) 500 MG tablet, Take 15,000 mg by mouth daily as needed (fever blister). , Disp: , Rfl: 11   fluticasone (FLONASE) 50 MCG/ACT nasal spray, Place 1 spray into both nostrils daily., Disp: 16 g, Rfl: 0   Folic Acid-Vit B6-Vit B12 (FABB) 2.2-25-1 MG TABS, Take 1 tablet by mouth daily., Disp: 180 tablet, Rfl: 3   gabapentin (NEURONTIN) 300 MG capsule, TAKE 1 CAPSULE BY MOUTH 3 TIMES A DAY AS NEEDED FOR PAIN, Disp: 270 capsule, Rfl: 0   hydrocortisone 2.5 % cream, SMARTSIG:1 Topical Every Night, Disp: , Rfl:    ibuprofen (ADVIL) 800 MG tablet, ibuprofen 800 mg tablet  TAKE 1 TABLET BY MOUTH THREE TIMES A DAY AS NEEDED (Patient not taking: Reported on 03/20/2021), Disp: , Rfl:    ketoconazole (NIZORAL) 2 % cream, , Disp: , Rfl:    ketoconazole (NIZORAL) 2 % shampoo, , Disp: , Rfl:    levothyroxine (SYNTHROID) 25 MCG tablet, levothyroxine 25 mcg tablet  TAKE 1 TABLET IN THE MORNING ON AN EMPTY STOMACH 30 MINUTES BEFORE FOOD, Disp: , Rfl:    lidocaine (LIDODERM) 5 %, APPLY 1 PATCH TO AFFECTED AREA FOR 12 HOURS IN A 24 HOUR PERIOD, Disp: 30 patch, Rfl: 2    LORazepam (ATIVAN) 1 MG tablet, Take 1 tablet (1 mg total) by mouth 3 (three) times daily as needed for anxiety., Disp: 15 tablet, Rfl: 0   Methotrexate 10 MG/0.4ML SOSY, 1 tab (Patient not taking: Reported on 03/20/2021), Disp: , Rfl:    Methotrexate, PF, (RASUVO) 25 MG/0.5ML SOAJ, INJECT ONE PEN SUBCUTANEOUSLY ONCE EVERY WEEK. STORE AT ROOM TEMPERATURE BETWEEN 68 - 77 DEGREES F., Disp: 6 mL, Rfl: 0   nitroGLYCERIN (NITROSTAT) 0.4 MG SL tablet, Place 1 tablet (0.4 mg total) under the tongue every 5 (five) minutes as needed for chest pain., Disp: 25 tablet, Rfl: 3   NYAMYC powder, Apply topically., Disp: , Rfl:    oseltamivir (TAMIFLU) 75 MG capsule, Take 1 capsule (75 mg total) by mouth every 12 (twelve) hours., Disp: 10 capsule, Rfl: 0   Oxymetazoline HCl (NASAL SPRAY) 0.05 % SOLN, Nasal Decongestant (oxymetazoline) 0.05 % spray, Disp: , Rfl:    predniSONE (DELTASONE) 5 MG tablet, Take 4 tablets by mouth daily x4 days, 3 tablets daily x4 days, 2 tablets daily x4 days, 1 tablet daily x4 days., Disp: 40 tablet, Rfl: 0   promethazine-dextromethorphan (PROMETHAZINE-DM) 6.25-15 MG/5ML syrup, Take 2.5 mLs by mouth 3 (three) times daily as needed for cough., Disp: 118 mL, Rfl: 0   propranolol (INDERAL) 10 MG tablet, Take 10 mg by mouth daily., Disp: , Rfl:    rizatriptan (MAXALT) 10 MG tablet, Take 1 tablet (10 mg total) by mouth as needed for migraine. May repeat in 2 hours if needed, Disp: 10 tablet, Rfl: 0   rosuvastatin (CRESTOR) 20 MG tablet, Take 20 mg by mouth., Disp: , Rfl:    Vitamin D, Ergocalciferol, (DRISDOL) 1.25 MG (50000 UT) CAPS capsule, Take 50,000 Units by mouth See admin instructions. Take 1 tablet (50000 units) by mouth every Monday, Wednesday, Saturday, Disp: , Rfl:   Observations/Objective: Patient is well-developed, well-nourished in no acute distress.  Resting comfortably at home. Appears fatigued Head is normocephalic, atraumatic.  No labored breathing. Speaking in full  sentences, and tolerating own secretions Speech is clear and coherent with logical content.  Patient is alert and oriented at baseline.    Assessment  and Plan: 1. Acute non-recurrent pansinusitis Get plenty of rest and push fluids Prescribed doxycycline for sinus infection (I decided to change from augentin due to possible interaction between augmentin and methotrexate)  Prescribed diflucan for possible yeast infection secondary to antibiotic use Use OTC zyrtec for nasal congestion, runny nose, and/or sore throat Use OTC flonase for nasal congestion and runny nose Use medications daily for symptom relief Use OTC medications like ibuprofen or tylenol as needed fever or pain Follow up in person with urgent care or go to the ED if you have any new or worsening symptoms such as fever, worsening cough, shortness of breath, chest tightness, chest pain, turning blue, changes in mental status, etc...   Follow Up Instructions: I discussed the assessment and treatment plan with the patient. The patient was provided an opportunity to ask questions and all were answered. The patient agreed with the plan and demonstrated an understanding of the instructions.  A copy of instructions were sent to the patient via MyChart unless otherwise noted below.   The patient was advised to call back or seek an in-person evaluation if the symptoms worsen or if the condition fails to improve as anticipated.  Time:  I spent 10 minutes with the patient via telehealth technology discussing the above problems/concerns.    [name], PA-C

## 2021-04-27 ENCOUNTER — Other Ambulatory Visit: Payer: Self-pay

## 2021-04-27 ENCOUNTER — Ambulatory Visit: Payer: BC Managed Care – PPO | Admitting: Physician Assistant

## 2021-04-27 ENCOUNTER — Encounter: Payer: Self-pay | Admitting: Emergency Medicine

## 2021-04-27 ENCOUNTER — Ambulatory Visit
Admission: EM | Admit: 2021-04-27 | Discharge: 2021-04-27 | Disposition: A | Discharge status: Home / Self Care | Payer: BC Managed Care – PPO | Attending: Physician Assistant | Admitting: Physician Assistant

## 2021-04-27 DIAGNOSIS — J101 Influenza due to other identified influenza virus with other respiratory manifestations: Secondary | ICD-10-CM

## 2021-04-27 DIAGNOSIS — R079 Chest pain, unspecified: Secondary | ICD-10-CM

## 2021-04-27 LAB — POCT INFLUENZA A/B
Influenza A, POC: POSITIVE — AB
Influenza B, POC: NEGATIVE

## 2021-04-27 MED ORDER — KETOROLAC TROMETHAMINE 30 MG/ML IJ SOLN
30.0000 mg | Freq: Once | INTRAMUSCULAR | Status: AC
  Administered 2021-04-27: 30 mg via INTRAMUSCULAR

## 2021-04-27 NOTE — ED Triage Notes (Signed)
Patient c/o sudden onset of right sided chest pain around 4pm today.  It's described more of a "pulling sensation".  Patient has been taken Tylenol for pain.

## 2021-04-27 NOTE — ED Provider Notes (Signed)
[facility]    CSN: [id number] Arrival date & time: 04/27/21  [place]      History   Chief Complaint Chief Complaint  Patient presents with   Chest Pain    HPI KAILI BIEBER is a 55 y.o. female.   Patient here today for evaluation of right-sided chest discomfort that started earlier today.  She reports that discomfort feels like a "pulling" sensation.  She denies any left-sided chest pain. Deep breathing can make right sided chest pain worse.  She has not had any shortness of breath.  She is recovering from flu but would like repeat flu test.  She continues to have headache and has tried Tylenol and ibuprofen without significant relief.  The history is provided by the patient.  Chest Pain Associated symptoms: cough and headache   Associated symptoms: no abdominal pain, no fever, no nausea, no shortness of breath and no vomiting    Past Medical History:  Diagnosis Date   Anxiety    Bursitis    Coronary artery calcification seen on CAT scan    minimal CAD with 30% prox and mild LAD   Depression    Diastolic dysfunction    Dyslipidemia 01/30/2015   Esophageal ring    Fibromyalgia    Hiatal hernia    HSV-1 infection    Morbid obesity ([place])    Osteoarthritis    Rheumatoid arthritis(714.0)    M05.79   Rheumatoid arthritis, seropositive, 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[place])    Treated with Orencia, TB neg 09/12/2015   Spondylolysis     Patient Active Problem List   Diagnosis Date Noted   Amenorrhea 06/09/2020   Anxiety 02/20/2018   Fatigue 02/20/2018   Low blood potassium 02/20/2018   Allergic rhinitis 12/14/2017   Fever blister 12/14/2017   Head congestion 12/14/2017   Hyperlipidemia 12/14/2017   Lateral epicondylitis of right elbow 05/04/2017   Sacral pain 03/23/2017   High risk medication use 08/03/2016   Fibromyalgia syndrome 08/03/2016   Abnormal cardiac CT angiography    Sacrococcygeal pain 02/20/2016   Rheumatoid arthritis involving multiple sites  ([place]) 01/24/2016   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   Dyslipidemia 01/30/2015   MDD (major depressive disorder), recurrent severe, without psychosis ([place]) 12/22/2014   Essential hypertension 08/25/2014   Morbid obesity ([place]) 08/25/2014   CAD (coronary artery disease), native coronary artery 08/24/2014   Numbness and tingling in left arm    Arm numbness left 08/22/2014   Chest pain 08/22/2014   Gastroesophageal reflux disease without esophagitis 07/23/2014   Coronary artery calcification seen on CAT scan 02/19/2014   Hip pain 10/23/2013   Solitary pulmonary nodule 08/28/2013   Chest pain, atypical 07/18/2013   Heart palpitations 06/27/2013   Cough [id number]   Diastolic dysfunction 123456   SOB (shortness of breath) 05/09/2013   Other malaise and fatigue 01/21/2013   Stress and adjustment reaction 12/09/2012   Right sided abdominal pain 11/29/2012   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/21/2012   Morbid obesity with BMI of 50.0-59.9, adult ([place]) 11/21/2012   Arthritis 07/13/2012   Depression 07/13/2012   Routine health maintenance 05/07/2012   Migraine 06/07/1998    Past Surgical History:  Procedure Laterality Date   CARDIAC CATHETERIZATION N/A 06/11/2016   Procedure: Left Heart Cath and Coronary Angiography;  Surgeon: Jettie Booze, MD;  Location: [facility];  Service: Cardiovascular;  Laterality: N/A;   CESAREAN SECTION     COLONOSCOPY  07/27/2017   ESOPHAGEAL MANOMETRY N/A 10/28/2014  Procedure: ESOPHAGEAL MANOMETRY (EM);  Surgeon: Irene Shipper, MD;  Location: [facility];  Service: Endoscopy;  Laterality: N/A;   HERNIA REPAIR     reports surgery on 3 hernias, with 2 more present   LAPAROSCOPIC GASTRIC SLEEVE RESECTION  11/21/12   [facility]   LEFT HEART CATHETERIZATION WITH CORONARY ANGIOGRAM N/A 08/26/2014   Procedure: LEFT HEART CATHETERIZATION WITH CORONARY ANGIOGRAM;  Surgeon: Lorretta Harp, MD;  Location: [facility] CATH LAB;  Service:  Cardiovascular;  Laterality: N/A;   TUBAL LIGATION      OB History   No obstetric history on file.      Home Medications    Prior to Admission medications   Medication Sig Start Date End Date Taking? Authorizing Provider  Abatacept (ORENCIA IV) Inject 1,000 mg into the vein every 28 (twenty-eight) days.   Yes Deveshwar, Abel Presto, MD  Acetaminophen-Codeine 300-30 MG tablet TAKE 1-2 TABLETS BY MOUTH EVERY 8 HOURS AS NEEDED. 06/11/20  Yes Mcarthur Rossetti, MD  Adapalene 0.3 % gel Apply 1 application topically daily as needed (acne).  12/31/14  Yes [provider]  Alcohol Swabs (ALCOHOL WIPES) 70 % PADS Alcohol Prep Pads   Yes [provider]  atorvastatin (LIPITOR) 80 MG tablet Add'l Sig Add'l Sig oral Add'l Sig 10/10/19  Yes [provider]  azelastine (ASTELIN) 0.1 % nasal spray Place 2 sprays into both nostrils 2 (two) times daily. 01/12/20  Yes Yu, Amy V, PA-C  benzonatate (TESSALON) 100 MG capsule Take 1 capsule (100 mg total) by mouth 2 (two) times daily as needed for cough. 04/15/21  Yes Perlie Mayo, NP  buPROPion (WELLBUTRIN XL) 150 MG 24 hr tablet Take 450 mg by mouth every morning. 05/23/19  Yes [provider]  cetirizine (ZYRTEC ALLERGY) 10 MG tablet Take 1 tablet (10 mg total) by mouth daily. 02/06/20  Yes Hall-Potvin, [place], PA-C  cyclobenzaprine (FLEXERIL) 10 MG tablet TAKE ONE TABLET BY MOUTH EVERY NIGHT AT BEDTIME AS NEEDED FOR MUSCLE SPASMS 03/20/21  Yes Ofilia Neas, PA-C  diazepam (VALIUM) 5 MG tablet Take 5 mg by mouth at bedtime. 05/05/20  Yes [provider]  diclofenac Sodium (VOLTAREN) 1 % GEL Apply 2-4 grams to affected joint 4 times daily as needed. 04/20/21  Yes Ofilia Neas, PA-C  diltiazem (CARDIZEM CD) 180 MG 24 hr capsule Take 1 capsule (180 mg total) by mouth daily. 01/23/21  Yes Turner, Eber Hong, MD  doxepin (SINEQUAN) 25 MG capsule Take 25 mg by mouth at bedtime as needed. 04/28/19  Yes [provider]  doxycycline (VIBRA-TABS) 100 MG tablet Take 1 tablet (100 mg total) by mouth 2 (two) times daily for 10 days. 04/26/21 05/06/21 Yes Wurst, [place], PA-C  EPINEPHrine 0.3 mg/0.3 mL IJ SOAJ injection epinephrine 0.3 mg/0.3 mL injection, auto-injector 07/02/19  Yes [provider]  ezetimibe (ZETIA) 10 MG tablet Take 10 mg by mouth daily. 10/22/19  Yes [provider]  famciclovir (FAMVIR) 500 MG tablet Take 15,000 mg by mouth daily as needed (fever blister).  01/21/15  Yes [provider]  fluconazole (DIFLUCAN) 150 MG tablet Take one dose by mouth, wait 72 hours, and then take second dose by mouth 04/26/21  Yes Wurst, [place], PA-C  fluticasone (FLONASE) 50 MCG/ACT nasal spray Place 1 spray into both nostrils daily. 02/06/20  Yes Hall-Potvin, [place], PA-C  Folic Acid-Vit [id number] [id number] (FABB) 2.2-25-1 MG TABS Take 1 tablet by mouth daily. 03/20/21  Yes Ofilia Neas, PA-C  gabapentin (NEURONTIN) 300 MG capsule TAKE 1 CAPSULE BY MOUTH 3 TIMES A DAY AS NEEDED FOR PAIN 03/20/21  Yes Ofilia Neas, PA-C  hydrocortisone 2.5 % cream SMARTSIG:1 Topical Every Night 11/06/19  Yes [provider]  ketoconazole (NIZORAL) 2 % cream  03/14/20  Yes [provider]  ketoconazole (NIZORAL) 2 % shampoo  06/03/20  Yes [provider]  levothyroxine (SYNTHROID) 25 MCG tablet levothyroxine 25 mcg tablet  TAKE 1 TABLET IN THE MORNING ON AN EMPTY STOMACH 30 MINUTES BEFORE FOOD   Yes [provider]  lidocaine (LIDODERM) 5 % APPLY 1 PATCH TO AFFECTED AREA FOR 12 HOURS IN A 24 HOUR PERIOD 03/20/21  Yes Ofilia Neas, PA-C  LORazepam (ATIVAN) 1 MG tablet Take 1 tablet (1 mg total) by mouth 3 (three) times daily as needed for anxiety. 10/09/19  Yes Faustino Congress, NP  Methotrexate 10 MG/0.4ML SOSY    Yes [provider]  Methotrexate, PF, (RASUVO) 25 MG/0.5ML SOAJ INJECT ONE PEN SUBCUTANEOUSLY ONCE EVERY WEEK. STORE AT ROOM TEMPERATURE BETWEEN 68 - 77  DEGREES F. 05/13/20  Yes Ofilia Neas, PA-C  nitroGLYCERIN (NITROSTAT) 0.4 MG SL tablet Place 1 tablet (0.4 mg total) under the tongue every 5 (five) minutes as needed for chest pain. 05/01/19  Yes Burtis Junes, NP  [facility] powder Apply topically. 03/17/20  Yes [provider]  oseltamivir (TAMIFLU) 75 MG capsule Take 1 capsule (75 mg total) by mouth every 12 (twelve) hours. 04/13/21  Yes Francene Finders, PA-C  Oxymetazoline HCl (NASAL SPRAY) 0.05 % SOLN Nasal Decongestant (oxymetazoline) 0.05 % spray   Yes [provider]  predniSONE (DELTASONE) 5 MG tablet Take 4 tablets by mouth daily x4 days, 3 tablets daily x4 days, 2 tablets daily x4 days, 1 tablet daily x4 days. 03/20/21  Yes Ofilia Neas, PA-C  promethazine-dextromethorphan (PROMETHAZINE-DM) 6.25-15 MG/5ML syrup Take 2.5 mLs by mouth 3 (three) times daily as needed for cough. 04/15/21  Yes Perlie Mayo, NP  propranolol (INDERAL) 10 MG tablet Take 10 mg by mouth daily.   Yes [provider]  rizatriptan (MAXALT) 10 MG tablet Take 1 tablet (10 mg total) by mouth as needed for migraine. May repeat in 2 hours if needed 04/15/21  Yes Perlie Mayo, NP  Vitamin D, Ergocalciferol, (DRISDOL) 1.25 MG (50000 UT) CAPS capsule Take 50,000 Units by mouth See admin instructions. Take 1 tablet (50000 units) by mouth every Monday, Wednesday, Saturday   Yes [provider]  ibuprofen (ADVIL) 800 MG tablet ibuprofen 800 mg tablet  TAKE 1 TABLET BY MOUTH THREE TIMES A DAY AS NEEDED Patient not taking: Reported on 03/20/2021    [provider]  rosuvastatin (CRESTOR) 20 MG tablet Take 20 mg by mouth. 09/21/16 03/20/21  [provider]    Family History Family History  Problem Relation Age of Onset   Hyperlipidemia Mother    Depression Mother    Sarcoidosis Father    Lung disease Father        Pleural Mesothelioma   Cancer Father    Heart attack Paternal Grandmother    Hypertension Paternal  Grandmother    Hypertension Maternal Grandmother    Diabetes Maternal Grandmother    Stroke Neg Hx    Colon cancer Neg Hx    Esophageal cancer Neg Hx    Rectal cancer Neg Hx    Stomach cancer Neg Hx     Social History Social History   Tobacco Use  Smoking status: Never   Smokeless tobacco: Never  Vaping Use   Vaping Use: Never used  Substance Use Topics   Alcohol use: No   Drug use: No     Allergies   Influenza vaccines, Amitriptyline, and Isosorbide   Review of Systems Review of Systems  Constitutional:  Negative for chills and fever.  HENT:  Positive for congestion. Negative for ear pain.   Eyes:  Negative for discharge and redness.  Respiratory:  Positive for cough. Negative for shortness of breath and wheezing.   Cardiovascular:  Positive for chest pain.  Gastrointestinal:  Negative for abdominal pain, nausea and vomiting.  Genitourinary:  Positive for vaginal bleeding and vaginal discharge.  Neurological:  Positive for headaches.    Physical Exam Triage Vital Signs ED Triage Vitals  Enc Vitals Group     BP 04/27/21 1849 (!) 135/94     Pulse Rate 04/27/21 1849 87     Resp --      Temp 04/27/21 1849 98.1 F (36.7 C)     Temp Source 04/27/21 1849 Oral     SpO2 04/27/21 1849 98 %     Weight 04/27/21 1850 266 lb 1.5 oz (120.7 kg)     Height 04/27/21 1850 5\' 2"  (1.575 m)     Head Circumference --      Peak Flow --      Pain Score 04/27/21 1850 6     Pain Loc --      Pain Edu? --      Excl. in GC? --    No data found.  Updated Vital Signs BP (!) 135/94 (BP Location: Left Arm)   Pulse 87   Temp 98.1 F (36.7 C) (Oral)   Ht 5\' 2"  (1.575 m)   Wt 266 lb 1.5 oz (120.7 kg)   SpO2 98%   BMI 48.67 kg/m    Physical Exam Vitals and nursing note reviewed.  Constitutional:      General: She is not in acute distress.    Appearance: Normal appearance. She is not ill-appearing.  HENT:     Head: Normocephalic and atraumatic.  Eyes:      Conjunctiva/sclera: Conjunctivae normal.  Cardiovascular:     Rate and Rhythm: Normal rate and regular rhythm.     Heart sounds: Normal heart sounds. No murmur heard. Pulmonary:     Effort: Pulmonary effort is normal. No respiratory distress.     Breath sounds: Normal breath sounds. No wheezing, rhonchi or rales.  Chest:     Chest wall: Tenderness (mild right sided chest tenderness where pain is reported) present.  Neurological:     Mental Status: She is alert.  Psychiatric:        Mood and Affect: Mood normal.        Behavior: Behavior normal.        Thought Content: Thought content normal.     UC Treatments / Results  Labs (all labs ordered are listed, but only abnormal results are displayed) Labs Reviewed  POCT INFLUENZA A/B - Abnormal; Notable for the following components:      Result Value   Influenza A, POC Positive (*)    All other components within normal limits    EKG   Radiology No results found.  Procedures Procedures (including critical care time)  Medications Ordered in UC Medications  ketorolac (TORADOL) 30 MG/ML injection 30 mg (30 mg Intramuscular Given 04/27/21 1950)    Initial Impression / Assessment and Plan / UC Course  I have reviewed the triage vital signs and the nursing notes.  Pertinent labs & imaging results that were available during my care of the patient were reviewed by me and considered in my medical decision making (see chart for details).    EKG reassuring.  Symptoms do not seem related to cardiac etiology given tenderness palpation on exam.  Recommended continued monitoring and follow-up with any worsening symptoms.  Toradol injection administered in office for headache at patient's request.  Final Clinical Impressions(s) / UC Diagnoses   Final diagnoses:  Right-sided chest pain  Influenza A   Discharge Instructions   None    ED Prescriptions   None    PDMP not reviewed this encounter.   Tomi Bamberger,  PA-C 04/28/21 [phone]

## 2021-05-04 ENCOUNTER — Ambulatory Visit (INDEPENDENT_AMBULATORY_CARE_PROVIDER_SITE_OTHER): Payer: BC Managed Care – PPO | Admitting: Physician Assistant

## 2021-05-04 ENCOUNTER — Other Ambulatory Visit: Payer: Self-pay

## 2021-05-04 DIAGNOSIS — M1711 Unilateral primary osteoarthritis, right knee: Secondary | ICD-10-CM | POA: Diagnosis not present

## 2021-05-04 MED ORDER — LIDOCAINE HCL 1 % IJ SOLN
1.5000 mL | INTRAMUSCULAR | Status: AC | PRN
Start: 2021-05-04 — End: 2021-05-04
  Administered 2021-05-04: 1.5 mL via INTRA_ARTICULAR

## 2021-05-04 MED ORDER — SODIUM HYALURONATE (VISCOSUP) 20 MG/2ML IX SOSY
20.0000 mg | PREFILLED_SYRINGE | INTRA_ARTICULAR | Status: AC | PRN
  Administered 2021-05-04: 20 mg via INTRA_ARTICULAR

## 2021-05-04 NOTE — Progress Notes (Signed)
   Procedure Note  Patient: [name] [name]             Date of Birth: Mar 25, 1966           MRN: [id number]             Visit Date: 05/04/2021  Procedures: Visit Diagnoses:  1. Primary osteoarthritis of right knee    Euflexxa #2 right knee, P/P SAMPLE  Large Joint Inj: R knee on 05/04/2021 1:56 PM Indications: pain Details: 27 G 1.5 in needle, medial approach  Arthrogram: No  Medications: 1.5 mL lidocaine 1 %; 20 mg Sodium Hyaluronate 20 MG/2ML Aspirate: 0 mL Outcome: tolerated well, no immediate complications Procedure, treatment alternatives, risks and benefits explained, specific risks discussed. Consent was given by the patient. Immediately prior to procedure a time out was called to verify the correct patient, procedure, equipment, support staff and site/side marked as required. Patient was prepped and draped in the usual sterile fashion.     Patient tolerated the procedure well.  Aftercare was discussed.  Sherron Ales, PA-C

## 2021-05-04 NOTE — Patient Instructions (Signed)

## 2021-05-11 ENCOUNTER — Other Ambulatory Visit: Payer: Self-pay

## 2021-05-11 ENCOUNTER — Ambulatory Visit (INDEPENDENT_AMBULATORY_CARE_PROVIDER_SITE_OTHER): Payer: BC Managed Care – PPO | Admitting: Physician Assistant

## 2021-05-11 DIAGNOSIS — M1711 Unilateral primary osteoarthritis, right knee: Secondary | ICD-10-CM

## 2021-05-11 MED ORDER — SODIUM HYALURONATE (VISCOSUP) 20 MG/2ML IX SOSY
20.0000 mg | PREFILLED_SYRINGE | INTRA_ARTICULAR | Status: AC | PRN
  Administered 2021-05-11: 20 mg via INTRA_ARTICULAR

## 2021-05-11 MED ORDER — LIDOCAINE HCL 1 % IJ SOLN
1.5000 mL | INTRAMUSCULAR | Status: AC | PRN
Start: 2021-05-11 — End: 2021-05-11
  Administered 2021-05-11: 1.5 mL via INTRA_ARTICULAR

## 2021-05-11 NOTE — Progress Notes (Signed)
   Procedure Note  Patient: [name] [name]             Date of Birth: Jul 07, 1965           MRN: [id number]             Visit Date: 05/11/2021  Procedures: Visit Diagnoses:  1. Primary osteoarthritis of right knee    Euflexxa #3 right knee, P/P SAMPLE Large Joint Inj: R knee on 05/11/2021 10:13 AM Indications: pain Details: 27 G 1.5 in needle, medial approach  Arthrogram: No  Medications: 1.5 mL lidocaine 1 %; 20 mg Sodium Hyaluronate 20 MG/2ML Aspirate: 0 mL Outcome: tolerated well, no immediate complications Procedure, treatment alternatives, risks and benefits explained, specific risks discussed. Consent was given by the patient. Immediately prior to procedure a time out was called to verify the correct patient, procedure, equipment, support staff and site/side marked as required. Patient was prepped and draped in the usual sterile fashion.    Patient tolerated the procedure well.  Aftercare was discussed.  Sherron Ales, PA-C

## 2021-05-19 ENCOUNTER — Telehealth: Payer: Self-pay | Admitting: Rheumatology

## 2021-05-19 NOTE — Telephone Encounter (Signed)
Patient called the office requesting a refill of FABB and Voltaren Gel. Patient stated that the pharmacy didn't fill the Voltaren Gel for 30 days and should would like a script for a 30 day supply if possible.

## 2021-05-19 NOTE — Telephone Encounter (Signed)
Spoke with patient and advised her prescription for Voltaren Gel was sent for 400 grams with 2 additional refills on 04/20/2021 and the FABB tab was sent on 03/20/2021 with 3 additional refills. Patient states she will reach out to the pharmacy and to contact the office if she has any trouble.

## 2021-06-03 ENCOUNTER — Telehealth: Payer: Self-pay | Admitting: Pharmacy Technician

## 2021-06-03 ENCOUNTER — Other Ambulatory Visit (HOSPITAL_COMMUNITY): Payer: Self-pay

## 2021-06-03 NOTE — Telephone Encounter (Signed)
Called [employer] to renew patient's Orencia PA that expires on 06/06/22. Spoke to rep, Maralyn Sago, she advised starting 06/07/21, Orencia Medical benefit PA's will be processed through [employer].    Was transferred to [employer], rep Corrie Dandy advised plan change is not active until 06/07/21, and they are unable to pro-actively initiate PA.  Will need to call back after 06/07/21 to initiate new PA.  Phone# [phone], option 6.

## 2021-06-09 NOTE — Telephone Encounter (Signed)
Submitted a Prior Authorization request to [employer] for [employer] via Phone. Will update once we receive a response.  Requested for 13 visits for 1 year. Faxed clinical in to complete review.   Case ID# [id number]  Phone# [phone] Fax# [phone]

## 2021-06-09 NOTE — Telephone Encounter (Signed)
Received notification from [facility] regarding a prior authorization for [facility]. Authorization has been APPROVED from 06/09/2021 to 06/08/2022. 14 visits are covered under medical benefits [employer] updated with new PA info. Approval letter sent to scan center.  Authorization # [id number]

## 2021-06-10 ENCOUNTER — Encounter: Payer: Self-pay | Admitting: Physician Assistant

## 2021-06-10 ENCOUNTER — Ambulatory Visit: Payer: BC Managed Care – PPO | Admitting: Physician Assistant

## 2021-06-10 DIAGNOSIS — M25561 Pain in right knee: Secondary | ICD-10-CM | POA: Diagnosis not present

## 2021-06-10 MED ORDER — ACETAMINOPHEN-CODEINE #3 300-30 MG PO TABS: 1.0000 | ORAL_TABLET | ORAL | 0 refills | Status: DC | PRN

## 2021-06-10 NOTE — Progress Notes (Signed)
Office Visit Note   Patient: [name] [name]           Date of Birth: Sep 11, 1965           MRN: [id number] Visit Date: 06/10/2021              Requested by: [name], MD [address] [place],  [place] [address] PCP: [name], MD   Assessment & Plan: Visit Diagnoses: No diagnosis found.  Plan: Recommend the use of Voltaren gel both knees 4 g 4 times daily.  She is given a new knee brace for the right knee as she has found this beneficial in the past and had worn her knee brace out.  She shown quad strengthening exercises.  She will continue physical therapy.  We will see her back in 6 weeks see how she is doing overall.  Questions were encouraged and answered.  She is given Tylenol 3 for pain.  She uses sparingly.  Questions encouraged and answered.  Follow-Up Instructions: Return in about 6 weeks (around 07/22/2021).   Orders:  No orders of the defined types were placed in this encounter.  Meds ordered this encounter  Medications   acetaminophen-codeine (TYLENOL #3) 300-30 MG tablet    Sig: Take 1 tablet by mouth every 4 (four) hours as needed for moderate pain.    Dispense:  30 tablet    Refill:  0      Procedures: No procedures performed   Clinical Data: No additional findings.  Subjective: Chief Complaint  Patient presents with   Right Knee - Pain    HPI Mrs. Lupien returns today with bilateral knee pain.  Currently her right knee pain is more bothersome than the left.History of MRI right knee December 2021 showed mild medial and patella degenerative changes.  Discussed getting supplemental injections with her in the past and she was able to get these through her rheumatologist.  Euflexxa injections were done late November or early December of this year.  She states left knee overall is doing well.  She notes that her right knee brace was helping but she is warned about.  She is having clicking and popping in the right knee.  She has  tried ice is not taking anything for pain and is requesting pain medication.  Review of Systems  Constitutional:  Negative for chills and fever.    Objective: Vital Signs: There were no vitals taken for this visit.  Physical Exam Constitutional:      Appearance: She is not ill-appearing or diaphoretic.  Pulmonary:     Effort: Pulmonary effort is normal.  Neurological:     Mental Status: She is alert.  Psychiatric:        Mood and Affect: Mood normal.    Ortho Exam Bilateral knees no abnormal warmth erythema.  Full range of motion bilateral knees.  No abnormal warmth erythema or effusion.  Significant patellofemoral crepitus bilaterally. Specialty Comments:  No specialty comments available.  Imaging: No results found.   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   Dyslipidemia 01/30/2015   MDD (major depressive disorder), recurrent severe, 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   Essential hypertension 08/25/2014   Morbid obesity ([facility]) 08/25/2014   CAD (coronary artery disease), native coronary artery 08/24/2014   Numbness and tingling in left arm    Arm numbness left 08/22/2014   Chest pain 08/22/2014   Gastroesophageal reflux disease without esophagitis 07/23/2014   Coronary artery calcification seen on CAT scan 02/19/2014   Hip pain 10/23/2013   Solitary pulmonary nodule 08/28/2013   Chest pain, atypical 07/18/2013    Heart palpitations 06/27/2013   Cough 06/04/2013   Diastolic dysfunction 05/18/2013   SOB (shortness of breath) 05/09/2013   Other malaise and fatigue 01/21/2013   Stress and adjustment reaction 12/09/2012   Right sided abdominal pain 11/29/2012   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/21/2012   Morbid obesity with BMI of 50.0-59.9, adult (HCC) 11/21/2012   Arthritis 07/13/2012   Depression 07/13/2012   Routine health maintenance 05/07/2012   Migraine 06/07/1998   Past Medical History:  Diagnosis Date   Anxiety    Bursitis    Coronary artery calcification seen on CAT scan    minimal CAD with 30% prox and mild LAD   Depression    Diastolic dysfunction    Dyslipidemia 01/30/2015   Esophageal ring    Fibromyalgia    Hiatal hernia    HSV-1 infection    Morbid obesity ([facility]CC)    Osteoarthritis    Rheumatoid arthritis(714.0)    M05.79   Rheumatoid arthritis, seropositive, 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HCC)    Treated with Orencia, TB neg 09/12/2015   Spondylolysis     Family History  Problem Relation Age of Onset   Hyperlipidemia Mother    Depression Mother    Sarcoidosis Father    Lung disease Father        Pleural Mesothelioma   Cancer Father    Heart attack Paternal Grandmother    Hypertension Paternal Grandmother    Hypertension Maternal Grandmother    Diabetes Maternal Grandmother    Stroke Neg Hx    Colon cancer Neg Hx    Esophageal cancer Neg Hx    Rectal cancer Neg Hx    Stomach cancer Neg Hx     Past Surgical History:  Procedure Laterality Date   CARDIAC CATHETERIZATION N/A 06/11/2016   Procedure: Left Heart Cath and Coronary Angiography;  Surgeon: Corky Crafts, MD;  [facility]: [facility] INVASIVE CV LAB;  Service: Cardiovascular;  Laterality: N/A;   CESAREAN SECTION     COLONOSCOPY  07/27/2017   ESOPHAGEAL MANOMETRY N/A 10/28/2014   Procedure: ESOPHAGEAL MANOMETRY (EM);  Surgeon: Hilarie Fredrickson, MD;  Location: [facility];  Service: Endoscopy;  Laterality: N/A;    HERNIA REPAIR     reports surgery on 3 hernias, with 2 more present   LAPAROSCOPIC GASTRIC SLEEVE RESECTION  11/21/12   [facility] [address]   LEFT HEART CATHETERIZATION WITH CORONARY ANGIOGRAM N/A 08/26/2014   Procedure: LEFT HEART CATHETERIZATION WITH CORONARY ANGIOGRAM;  Surgeon: Runell Gess, MD;  Location: [facility] CATH LAB;  Service: Cardiovascular;  Laterality: N/A;   TUBAL LIGATION     Social History   Occupational History   Occupation: CUSTOMER SERVICE    Employer: [employer]  Tobacco Use   Smoking status: Never   Smokeless tobacco: Never  Vaping Use   Vaping Use: Never used  Substance and Sexual  Activity   Alcohol use: No   Drug use: No   Sexual activity: Yes    Partners: Male    Birth control/protection: None

## 2021-06-12 ENCOUNTER — Other Ambulatory Visit: Payer: Self-pay

## 2021-06-12 ENCOUNTER — Ambulatory Visit (HOSPITAL_COMMUNITY)
Admission: RE | Admit: 2021-06-12 | Discharge: 2021-06-12 | Disposition: A | Discharge status: Home / Self Care | Payer: BC Managed Care – PPO | Source: Ambulatory Visit | Attending: Rheumatology | Admitting: Rheumatology

## 2021-06-12 DIAGNOSIS — Z79899 Other long term (current) drug therapy: Secondary | ICD-10-CM | POA: Insufficient documentation

## 2021-06-12 DIAGNOSIS — M0579 Rheumatoid arthritis with rheumatoid factor of multiple sites without organ or systems involvement: Secondary | ICD-10-CM | POA: Insufficient documentation

## 2021-06-12 LAB — COMPREHENSIVE METABOLIC PANEL
ALT: 20 U/L (ref 0–44)
AST: 22 U/L (ref 15–41)
Albumin: 3.4 g/dL — ABNORMAL LOW (ref 3.5–5.0)
Alkaline Phosphatase: 82 U/L (ref 38–126)
Anion gap: 6 (ref 5–15)
BUN: 12 mg/dL (ref 6–20)
CO2: 27 mmol/L (ref 22–32)
Calcium: 9.1 mg/dL (ref 8.9–10.3)
Chloride: 105 mmol/L (ref 98–111)
Creatinine, Ser: 0.91 mg/dL (ref 0.44–1.00)
GFR, Estimated: >60 mL/min (ref >60)
Glucose, Bld: 96 mg/dL (ref 70–99)
Potassium: 3.8 mmol/L (ref 3.5–5.1)
Sodium: 138 mmol/L (ref 135–145)
Total Bilirubin: 0.6 mg/dL (ref 0.3–1.2)
Total Protein: 7 g/dL (ref 6.5–8.1)

## 2021-06-12 LAB — CBC WITH DIFFERENTIAL/PLATELET
Abs Immature Granulocytes: 0.05 10*3/uL (ref 0.00–0.07)
Basophils Absolute: 0.1 10*3/uL (ref 0.0–0.1)
Basophils Relative: 1 %
Eosinophils Absolute: 0.2 10*3/uL (ref 0.0–0.5)
Eosinophils Relative: 3 %
HCT: 44.6 % (ref 36.0–46.0)
Hemoglobin: 14.1 g/dL (ref 12.0–15.0)
Immature Granulocytes: 1 %
Lymphocytes Relative: 43 %
Lymphs Abs: 4.2 10*3/uL — ABNORMAL HIGH (ref 0.7–4.0)
MCH: 26.8 pg (ref 26.0–34.0)
MCHC: 31.6 g/dL (ref 30.0–36.0)
MCV: 84.8 fL (ref 80.0–100.0)
Monocytes Absolute: 0.7 10*3/uL (ref 0.1–1.0)
Monocytes Relative: 7 %
Neutro Abs: 4.5 10*3/uL (ref 1.7–7.7)
Neutrophils Relative %: 45 %
Platelets: 298 10*3/uL (ref 150–400)
RBC: 5.26 MIL/uL — ABNORMAL HIGH (ref 3.87–5.11)
RDW: 12.6 % (ref 11.5–15.5)
WBC: 9.8 10*3/uL (ref 4.0–10.5)
nRBC: 0 % (ref 0.0–0.2)

## 2021-06-12 MED ORDER — ACETAMINOPHEN 325 MG PO TABS
ORAL_TABLET | ORAL | Status: AC
  Filled 2021-06-12: qty 2

## 2021-06-12 MED ORDER — DIPHENHYDRAMINE HCL 25 MG PO CAPS
ORAL_CAPSULE | ORAL | Status: AC
  Filled 2021-06-12: qty 1

## 2021-06-12 MED ORDER — SODIUM CHLORIDE 0.9 % IV SOLN
1000.0000 mg | INTRAVENOUS | Status: DC
  Administered 2021-06-12: 1000 mg via INTRAVENOUS
  Filled 2021-06-12: qty 40

## 2021-06-12 MED ORDER — DIPHENHYDRAMINE HCL 25 MG PO CAPS
25.0000 mg | ORAL_CAPSULE | ORAL | Status: DC
  Administered 2021-06-12: 25 mg via ORAL

## 2021-06-12 MED ORDER — ACETAMINOPHEN 325 MG PO TABS
650.0000 mg | ORAL_TABLET | ORAL | Status: DC
  Administered 2021-06-12: 650 mg via ORAL

## 2021-06-12 NOTE — Progress Notes (Signed)
Albumin is borderline low-3.4.  rest of CMP WNL.

## 2021-06-12 NOTE — Progress Notes (Signed)
RBC count is borderline elevated but stable.  Absolute lymphocyte count is borderline elevated. Total WBC  count is WNL.

## 2021-06-20 ENCOUNTER — Other Ambulatory Visit: Payer: Self-pay | Admitting: Physician Assistant

## 2021-06-22 NOTE — Telephone Encounter (Signed)
Next Visit: 08/21/2021   Last Visit: 03/20/2021   Last Fill: 03/20/2021  DX: Fibromyalgia   Current Dose per office note 03/20/2021: gabapentin 300 mg 1 capsule 3 times daily   Okay to refill Gabapentin?

## 2021-07-03 ENCOUNTER — Telehealth: Payer: BC Managed Care – PPO | Admitting: Nurse Practitioner

## 2021-07-03 ENCOUNTER — Other Ambulatory Visit: Payer: Self-pay | Admitting: Nurse Practitioner

## 2021-07-03 DIAGNOSIS — J0101 Acute recurrent maxillary sinusitis: Secondary | ICD-10-CM

## 2021-07-03 MED ORDER — FLUCONAZOLE 150 MG PO TABS: ORAL_TABLET | ORAL | 0 refills | Status: DC

## 2021-07-03 MED ORDER — CETIRIZINE-PSEUDOEPHEDRINE ER 5-120 MG PO TB12: 1.0000 | ORAL_TABLET | Freq: Two times a day (BID) | ORAL | 0 refills | Status: DC

## 2021-07-03 MED ORDER — AMOXICILLIN-POT CLAVULANATE 875-125 MG PO TABS: 1.0000 | ORAL_TABLET | Freq: Two times a day (BID) | ORAL | 0 refills | Status: DC

## 2021-07-03 NOTE — Patient Instructions (Signed)

## 2021-07-03 NOTE — Progress Notes (Signed)
Virtual Visit Consent   [name] [name], you are scheduled for a virtual visit with Mary-Margaret Daphine DeutscherMartin, FNP, a [facility] provider, today.     Just as with appointments in the office, your consent must be obtained to participate.  Your consent will be active for this visit and any virtual visit you may have with one of our providers in the next 365 days.     If you have a MyChart account, a copy of this consent can be sent to you electronically.  All virtual visits are billed to your insurance company just like a traditional visit in the office.    As this is a virtual visit, video technology does not allow for your provider to perform a traditional examination.  This may limit your provider's ability to fully assess your condition.  If your provider identifies any concerns that need to be evaluated in person or the need to arrange testing (such as labs, EKG, etc.), we will make arrangements to do so.     Although advances in technology are sophisticated, we cannot ensure that it will always work on either your end or our end.  If the connection with a video visit is poor, the visit may have to be switched to a telephone visit.  With either a video or telephone visit, we are not always able to ensure that we have a secure connection.     I need to obtain your verbal consent now.   Are you willing to proceed with your visit today? YES   [name] [name] has provided verbal consent on 07/03/2021 for a virtual visit (video or telephone).   Mary-Margaret Daphine DeutscherMartin, FNP   Date: 07/03/2021 11:09 AM   Virtual Visit via Video Note   I, Mary-Margaret Daphine DeutscherMartin, connected with [name] [name] ([id number], [birth year]) on 07/03/21 at 11:15 AM EST by a video-enabled telemedicine application and verified that I am speaking with the correct person using two identifiers.  Location:23/23 Patient: Virtual Visit Location Patient: Home Provider: Virtual Visit Location Provider: Mobile   I  discussed the limitations of evaluation and management by telemedicine and the availability of in person appointments. The patient expressed understanding and agreed to proceed.    History of Present Illness: [name] [name] is a 56 y.o. who identifies as a female who was assigned female at birth, and is being seen today for headache and sinus congestion.  HPI: Sinusitis This is a new problem. The current episode started in the past 7 days. The problem has been gradually worsening since onset. The maximum temperature recorded prior to her arrival was 100.4 - 100.9 F. The fever has been present for 1 to 2 days. Her pain is at a severity of 4/10. Associated symptoms include congestion, coughing, headaches, sinus pressure and a sore throat. Pertinent negatives include no chills. Past treatments include acetaminophen. The treatment provided mild relief.  Tested positive on MOnday 06/29/21. Started on antviral on 06/29/21. She is no better. She still has bad headache and facial pressure. *HAs RA and is on IV meds that decrease his immune system. Review of Systems  Constitutional:  Negative for chills and fever.  HENT:  Positive for congestion, sinus pressure and sore throat.   Respiratory:  Positive for cough.   Musculoskeletal:  Negative for myalgias.  Neurological:  Positive for headaches. Negative for dizziness.   Problems:  Patient Active Problem List   Diagnosis Date Noted   Amenorrhea 06/09/2020   Anxiety 02/20/2018   Fatigue  02/20/2018   Low blood potassium 02/20/2018   Allergic rhinitis 12/14/2017   Fever blister 12/14/2017   Head congestion 12/14/2017   Hyperlipidemia 12/14/2017   Lateral epicondylitis of right elbow 05/04/2017   Sacral pain 03/23/2017   High risk medication use 08/03/2016   Fibromyalgia syndrome 08/03/2016   Abnormal cardiac CT angiography    Sacrococcygeal pain 02/20/2016   Rheumatoid arthritis involving 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HCC) 01/24/2016   Sacroiliac joint  disease 01/05/2016   Dyslipidemia 01/30/2015   MDD (major depressive disorder), recurrent severe, 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   Essential hypertension 08/25/2014   Morbid obesity (HCC) 08/25/2014   CAD (coronary artery disease), native coronary artery 08/24/2014   Numbness and tingling in left arm    Arm numbness left 08/22/2014   Chest pain 08/22/2014   Gastroesophageal reflux disease without esophagitis 07/23/2014   Coronary artery calcification seen on CAT scan 02/19/2014   Hip pain 10/23/2013   Solitary pulmonary nodule 08/28/2013   Chest pain, atypical 07/18/2013   Heart palpitations 06/27/2013   Cough 06/04/2013   Diastolic dysfunction 05/18/2013   SOB (shortness of breath) 05/09/2013   Other malaise and fatigue 01/21/2013   Stress and adjustment reaction 12/09/2012   Right sided abdominal pain 11/29/2012   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/21/2012   Morbid obesity with BMI of 50.0-59.9, adult (HCC) 11/21/2012   Arthritis 07/13/2012   Depression 07/13/2012   Routine health maintenance 05/07/2012   Migraine 06/07/1998    Allergies:  Allergies  Allergen Reactions   Influenza Vaccines Anaphylaxis   Amitriptyline    Isosorbide     Severe headaches   Medications:  Current Outpatient Medications:    Abatacept (ORENCIA IV), Inject 1,000 mg into the vein every 28 (twenty-eight) days., Disp: , Rfl:    acetaminophen-codeine (TYLENOL #3) 300-30 MG tablet, Take 1 tablet by mouth every 4 (four) hours as needed for moderate pain., Disp: 30 tablet, Rfl: 0   Adapalene 0.3 % gel, Apply 1 application topically daily as needed (acne). , Disp: , Rfl:    Alcohol Swabs (ALCOHOL WIPES) 70 % PADS, Alcohol Prep Pads, Disp: , Rfl:    atorvastatin (LIPITOR) 80 MG tablet, Add'l Sig Add'l Sig oral Add'l Sig, Disp: , Rfl:    azelastine (ASTELIN) 0.1 % nasal spray, Place 2 sprays into both nostrils 2 (two) times daily., Disp: 30 mL, Rfl: 0   benzonatate (TESSALON) 100 MG  capsule, Take 1 capsule (100 mg total) by mouth 2 (two) times daily as needed for cough., Disp: 20 capsule, Rfl: 0   buPROPion (WELLBUTRIN XL) 150 MG 24 hr tablet, Take 450 mg by mouth every morning., Disp: , Rfl:    cetirizine (ZYRTEC ALLERGY) 10 MG tablet, Take 1 tablet (10 mg total) by mouth daily., Disp: 30 tablet, Rfl: 0   cyclobenzaprine (FLEXERIL) 10 MG tablet, TAKE ONE TABLET BY MOUTH EVERY NIGHT AT BEDTIME AS NEEDED FOR MUSCLE SPASMS, Disp: 30 tablet, Rfl: 0   diazepam (VALIUM) 5 MG tablet, Take 5 mg by mouth at bedtime., Disp: , Rfl:    diclofenac Sodium (VOLTAREN) 1 % GEL, Apply 2-4 grams to affected joint 4 times daily as needed., Disp: 400 g, Rfl: 2   diltiazem (CARDIZEM CD) 180 MG 24 hr capsule, Take 1 capsule (180 mg total) by mouth daily., Disp: 15 capsule, Rfl: 0   doxepin (SINEQUAN) 25 MG capsule, Take 25 mg by mouth at bedtime as needed., Disp: , Rfl:    EPINEPHrine 0.3 mg/0.3 mL IJ SOAJ injection,  epinephrine 0.3 mg/0.3 mL injection, auto-injector, Disp: , Rfl:    ezetimibe (ZETIA) 10 MG tablet, Take 10 mg by mouth daily., Disp: , Rfl:    famciclovir (FAMVIR) 500 MG tablet, Take 15,000 mg by mouth daily as needed (fever blister). , Disp: , Rfl: 11   fluconazole (DIFLUCAN) 150 MG tablet, Take one dose by mouth, wait 72 hours, and then take second dose by mouth, Disp: 2 tablet, Rfl: 0   fluticasone (FLONASE) 50 MCG/ACT nasal spray, Place 1 spray into both nostrils daily., Disp: 16 g, Rfl: 0   Folic Acid-Vit B6-Vit B12 (FABB) 2.2-25-1 MG TABS, Take 1 tablet by mouth daily., Disp: 180 tablet, Rfl: 3   gabapentin (NEURONTIN) 300 MG capsule, TAKE ONE CAPSULE BY MOUTH THREE TIMES A DAY AS NEEDED FOR PAIN, Disp: 90 capsule, Rfl: 2   hydrocortisone 2.5 % cream, SMARTSIG:1 Topical Every Night, Disp: , Rfl:    ketoconazole (NIZORAL) 2 % cream, , Disp: , Rfl:    ketoconazole (NIZORAL) 2 % shampoo, , Disp: , Rfl:    levothyroxine (SYNTHROID) 25 MCG tablet, levothyroxine 25 mcg tablet  TAKE 1  TABLET IN THE MORNING ON AN EMPTY STOMACH 30 MINUTES BEFORE FOOD, Disp: , Rfl:    lidocaine (LIDODERM) 5 %, APPLY 1 PATCH TO AFFECTED AREA FOR 12 HOURS IN A 24 HOUR PERIOD, Disp: 30 patch, Rfl: 2   LORazepam (ATIVAN) 1 MG tablet, Take 1 tablet (1 mg total) by mouth 3 (three) times daily as needed for anxiety., Disp: 15 tablet, Rfl: 0   Methotrexate 10 MG/0.4ML SOSY, , Disp: , Rfl:    Methotrexate, PF, (RASUVO) 25 MG/0.5ML SOAJ, INJECT ONE PEN SUBCUTANEOUSLY ONCE EVERY WEEK. STORE AT ROOM TEMPERATURE BETWEEN 68 - 77 DEGREES F., Disp: 6 mL, Rfl: 0   nitroGLYCERIN (NITROSTAT) 0.4 MG SL tablet, Place 1 tablet (0.4 mg total) under the tongue every 5 (five) minutes as needed for chest pain., Disp: 25 tablet, Rfl: 3   NYAMYC powder, Apply topically., Disp: , Rfl:    oseltamivir (TAMIFLU) 75 MG capsule, Take 1 capsule (75 mg total) by mouth every 12 (twelve) hours., Disp: 10 capsule, Rfl: 0   Oxymetazoline HCl (NASAL SPRAY) 0.05 % SOLN, Nasal Decongestant (oxymetazoline) 0.05 % spray, Disp: , Rfl:    predniSONE (DELTASONE) 5 MG tablet, Take 4 tablets by mouth daily x4 days, 3 tablets daily x4 days, 2 tablets daily x4 days, 1 tablet daily x4 days., Disp: 40 tablet, Rfl: 0   promethazine-dextromethorphan (PROMETHAZINE-DM) 6.25-15 MG/5ML syrup, Take 2.5 mLs by mouth 3 (three) times daily as needed for cough., Disp: 118 mL, Rfl: 0   propranolol (INDERAL) 10 MG tablet, Take 10 mg by mouth daily., Disp: , Rfl:    rizatriptan (MAXALT) 10 MG tablet, Take 1 tablet (10 mg total) by mouth as needed for migraine. May repeat in 2 hours if needed, Disp: 10 tablet, Rfl: 0   rosuvastatin (CRESTOR) 20 MG tablet, Take 20 mg by mouth., Disp: , Rfl:    Vitamin D, Ergocalciferol, (DRISDOL) 1.25 MG (50000 UT) CAPS capsule, Take 50,000 Units by mouth See admin instructions. Take 1 tablet (50000 units) by mouth every Monday, Wednesday, Saturday, Disp: , Rfl:   Observations/Objective: Patient is well-developed, well-nourished in no  acute distress.  Resting comfortably  at home.  Head is normocephalic, atraumatic.  No labored breathing.  Speech is clear and coherent with logical content.  Patient is alert and oriented at baseline.  Raspy voice Dry cough Points to maxilary facial  pressure.  Assessment and Plan:  [name] Hanly in today with chief complaint of Sinusitis   1. Acute recurrent maxillary sinusitis 1. Take meds as prescribed 2. Use a cool mist humidifier especially during the winter months and when heat has been humid. 3. Use saline nose sprays frequently 4. Saline irrigations of the nose can be very helpful if done frequently.  * 4X daily for 1 week*  * Use of a nettie pot can be helpful with this. Follow directions with this* 5. Drink plenty of fluids 6. Keep thermostat turn down low 7.For any cough or congestion- delsym OTC 8. For fever or aces or pains- take tylenol or ibuprofen appropriate for age and weight.  * for fevers greater than 101 orally you may alternate ibuprofen and tylenol every  3 hours.    Meds ordered this encounter  Medications   amoxicillin-clavulanate (AUGMENTIN) 875-125 MG tablet    Sig: Take 1 tablet by mouth 2 (two) times daily.    Dispense:  14 tablet    Refill:  0    Order Specific Question:   Supervising Provider    Answer:   MILLER, BRIAN [[id number]]   cetirizine-pseudoephedrine (ZYRTEC-D) 5-120 MG tablet    Sig: Take 1 tablet by mouth 2 (two) times daily.    Dispense:  30 tablet    Refill:  0    Order Specific Question:   Supervising Provider    Answer:   Eber Hong [[id number]]      Follow Up Instructions: I discussed the assessment and treatment plan with the patient. The patient was provided an opportunity to ask questions and all were answered. The patient agreed with the plan and demonstrated an understanding of the instructions.  A copy of instructions were sent to the patient via MyChart.  The patient was advised to call back or seek an in-person  evaluation if the symptoms worsen or if the condition fails to improve as anticipated.  Time:  I spent 10 minutes with the patient via telehealth technology discussing the above problems/concerns.    Mary-Margaret Daphine Deutscher, FNP

## 2021-07-05 ENCOUNTER — Emergency Department (HOSPITAL_COMMUNITY)
Admission: EM | Admit: 2021-07-05 | Discharge: 2021-07-05 | Disposition: A | Discharge status: Home / Self Care | Payer: BC Managed Care – PPO | Attending: Emergency Medicine | Admitting: Emergency Medicine

## 2021-07-05 ENCOUNTER — Emergency Department (HOSPITAL_COMMUNITY): Payer: BC Managed Care – PPO

## 2021-07-05 ENCOUNTER — Encounter (HOSPITAL_COMMUNITY): Payer: Self-pay

## 2021-07-05 ENCOUNTER — Other Ambulatory Visit: Payer: Self-pay

## 2021-07-05 DIAGNOSIS — U071 COVID-19: Secondary | ICD-10-CM

## 2021-07-05 DIAGNOSIS — R519 Headache, unspecified: Secondary | ICD-10-CM | POA: Diagnosis present

## 2021-07-05 DIAGNOSIS — R079 Chest pain, unspecified: Secondary | ICD-10-CM | POA: Insufficient documentation

## 2021-07-05 DIAGNOSIS — R059 Cough, unspecified: Secondary | ICD-10-CM | POA: Insufficient documentation

## 2021-07-05 DIAGNOSIS — Z79899 Other long term (current) drug therapy: Secondary | ICD-10-CM | POA: Diagnosis not present

## 2021-07-05 MED ORDER — IBUPROFEN 200 MG PO TABS
600.0000 mg | ORAL_TABLET | Freq: Once | ORAL | Status: AC
  Administered 2021-07-05: 600 mg via ORAL
  Filled 2021-07-05: qty 3

## 2021-07-05 MED ORDER — ACETAMINOPHEN 325 MG PO TABS
650.0000 mg | ORAL_TABLET | Freq: Once | ORAL | Status: AC
  Administered 2021-07-05: 650 mg via ORAL
  Filled 2021-07-05: qty 2

## 2021-07-05 NOTE — Discharge Instructions (Signed)
Call your primary care doctor or specialist as discussed in the next 2-3 days.   Return immediately back to the ER if:  Your symptoms worsen within the next 12-24 hours. You develop new symptoms such as new fevers, persistent vomiting, new pain, shortness of breath, or new weakness or numbness, or if you have any other concerns.  

## 2021-07-05 NOTE — ED Provider Notes (Signed)
[facility]-EMERGENCY DEPT Provider Note   CSN: [id number] Arrival date & time: 07/05/21  1124     History  Chief Complaint  Patient presents with   Chest Pain   Headache   Shortness of Breath    [name] [name] is a 56 y.o. female.  Patient presents chief complaint of continued headache cough and chest pain with cough.  She has no chest pain at rest.  Because she states that she has right-sided chest pain.  No fevers.  No vomiting or diarrhea.  She was diagnosed with COVID approximately a week ago and has been taking Paxlovid.        Home Medications Prior to Admission medications   Medication Sig Start Date End Date Taking? Authorizing Provider  Abatacept (ORENCIA IV) Inject 1,000 mg into the vein every 28 (twenty-eight) days.    Pollyann Savoy, MD  acetaminophen-codeine (TYLENOL #3) 300-30 MG tablet Take 1 tablet by mouth every 4 (four) hours as needed for moderate pain. 06/10/21   Kirtland Bouchard, PA-C  Adapalene 0.3 % gel Apply 1 application topically daily as needed (acne).  12/31/14   [provider]  Alcohol Swabs (ALCOHOL WIPES) 70 % PADS Alcohol Prep Pads    [provider]  amoxicillin-clavulanate (AUGMENTIN) 875-125 MG tablet Take 1 tablet by mouth 2 (two) times daily. 07/03/21   Daphine Deutscher Mary-Margaret, FNP  atorvastatin (LIPITOR) 80 MG tablet Add'l Sig Add'l Sig oral Add'l Sig 10/10/19   [provider]  azelastine (ASTELIN) 0.1 % nasal spray Place 2 sprays into both nostrils 2 (two) times daily. 01/12/20   Cathie Hoops, Amy V, PA-C  benzonatate (TESSALON) 100 MG capsule Take 1 capsule (100 mg total) by mouth 2 (two) times daily as needed for cough. 04/15/21   Freddy Finner, NP  buPROPion (WELLBUTRIN XL) 150 MG 24 hr tablet Take 450 mg by mouth every morning. 05/23/19   [provider]  cetirizine (ZYRTEC ALLERGY) 10 MG tablet Take 1 tablet (10 mg total) by mouth daily. 02/06/20   Hall-Potvin, [place], PA-C   cetirizine-pseudoephedrine (ZYRTEC-D) 5-120 MG tablet Take 1 tablet by mouth 2 (two) times daily. 07/03/21   Daphine Deutscher Mary-Margaret, FNP  cyclobenzaprine (FLEXERIL) 10 MG tablet TAKE ONE TABLET BY MOUTH EVERY NIGHT AT BEDTIME AS NEEDED FOR MUSCLE SPASMS 03/20/21   Gearldine Bienenstock, PA-C  diazepam (VALIUM) 5 MG tablet Take 5 mg by mouth at bedtime. 05/05/20   [provider]  diclofenac Sodium (VOLTAREN) 1 % GEL Apply 2-4 grams to affected joint 4 times daily as needed. 04/20/21   Gearldine Bienenstock, PA-C  diltiazem (CARDIZEM CD) 180 MG 24 hr capsule Take 1 capsule (180 mg total) by mouth daily. 01/23/21   Quintella Reichert, MD  doxepin (SINEQUAN) 25 MG capsule Take 25 mg by mouth at bedtime as needed. 04/28/19   [provider]  EPINEPHrine 0.3 mg/0.3 mL IJ SOAJ injection epinephrine 0.3 mg/0.3 mL injection, auto-injector 07/02/19   [provider]  ezetimibe (ZETIA) 10 MG tablet Take 10 mg by mouth daily. 10/22/19   [provider]  famciclovir (FAMVIR) 500 MG tablet Take 15,000 mg by mouth daily as needed (fever blister).  01/21/15   [provider]  fluconazole (DIFLUCAN) 150 MG tablet Take one dose by mouth, wait 72 hours, and then take second dose by mouth 07/03/21   Daphine Deutscher, Mary-Margaret, FNP  fluticasone (FLONASE) 50 MCG/ACT nasal spray Place 1 spray into both nostrils daily. 02/06/20   Hall-Potvin, [place],  PA-C  Folic Acid-Vit B6-Vit B12 (FABB) 2.2-25-1 MG TABS Take 1 tablet by mouth daily. 03/20/21   Gearldine Bienenstock, PA-C  gabapentin (NEURONTIN) 300 MG capsule TAKE ONE CAPSULE BY MOUTH THREE TIMES A DAY AS NEEDED FOR PAIN 06/22/21   Pollyann Savoy, MD  hydrocortisone 2.5 % cream SMARTSIG:1 Topical Every Night 11/06/19   [provider]  ketoconazole (NIZORAL) 2 % cream  03/14/20   [provider]  ketoconazole (NIZORAL) 2 % shampoo  06/03/20   [provider]  levothyroxine (SYNTHROID) 25 MCG tablet levothyroxine 25 mcg tablet  TAKE 1  TABLET IN THE MORNING ON AN EMPTY STOMACH 30 MINUTES BEFORE FOOD    [provider]  lidocaine (LIDODERM) 5 % APPLY 1 PATCH TO AFFECTED AREA FOR 12 HOURS IN A 24 HOUR PERIOD 03/20/21   Gearldine Bienenstock, PA-C  LORazepam (ATIVAN) 1 MG tablet Take 1 tablet (1 mg total) by mouth 3 (three) times daily as needed for anxiety. 10/09/19   Moshe Cipro, NP  Methotrexate 10 MG/0.4ML SOSY     [provider]  Methotrexate, PF, (RASUVO) 25 MG/0.5ML SOAJ INJECT ONE PEN SUBCUTANEOUSLY ONCE EVERY WEEK. STORE AT ROOM TEMPERATURE BETWEEN 68 - 77 DEGREES F. 05/13/20   Gearldine Bienenstock, PA-C  nitroGLYCERIN (NITROSTAT) 0.4 MG SL tablet Place 1 tablet (0.4 mg total) under the tongue every 5 (five) minutes as needed for chest pain. 05/01/19   Rosalio Macadamia, NP  [facility] powder Apply topically. 03/17/20   [provider]  oseltamivir (TAMIFLU) 75 MG capsule Take 1 capsule (75 mg total) by mouth every 12 (twelve) hours. 04/13/21   Tomi Bamberger, PA-C  Oxymetazoline HCl (NASAL SPRAY) 0.05 % SOLN Nasal Decongestant (oxymetazoline) 0.05 % spray    [provider]  predniSONE (DELTASONE) 5 MG tablet Take 4 tablets by mouth daily x4 days, 3 tablets daily x4 days, 2 tablets daily x4 days, 1 tablet daily x4 days. 03/20/21   Gearldine Bienenstock, PA-C  promethazine-dextromethorphan (PROMETHAZINE-DM) 6.25-15 MG/5ML syrup Take 2.5 mLs by mouth 3 (three) times daily as needed for cough. 04/15/21   Freddy Finner, NP  propranolol (INDERAL) 10 MG tablet Take 10 mg by mouth daily.    [provider]  rizatriptan (MAXALT) 10 MG tablet Take 1 tablet (10 mg total) by mouth as needed for migraine. May repeat in 2 hours if needed 04/15/21   Freddy Finner, NP  rosuvastatin (CRESTOR) 20 MG tablet Take 20 mg by mouth. 09/21/16 03/20/21  [provider]  Vitamin D, Ergocalciferol, (DRISDOL) 1.25 MG (50000 UT) CAPS capsule Take 50,000 Units by mouth See admin instructions. Take 1 tablet (50000 units)  by mouth every Monday, Wednesday, Saturday    [provider]      Allergies    Influenza vaccines, Amitriptyline, and Isosorbide    Review of Systems   Review of Systems  Constitutional:  Negative for fever.  HENT:  Negative for ear pain.   Eyes:  Negative for pain.  Respiratory:  Positive for cough.   Cardiovascular:  Positive for chest pain.  Gastrointestinal:  Negative for abdominal pain.  Genitourinary:  Negative for flank pain.  Musculoskeletal:  Negative for back pain.  Skin:  Negative for rash.  Neurological:  Negative for headaches.   Physical Exam Updated Vital Signs BP (!) 143/83    Pulse 72    Temp 98.2 F (36.8 C) (Oral)    Resp 12    Ht  (1.575  m)    Wt 119.7 kg    SpO2 99%    BMI 48.29 kg/m  Physical Exam Constitutional:      General: She is not in acute distress.    Appearance: Normal appearance.  HENT:     Head: Normocephalic.     Nose: Nose normal.  Eyes:     Extraocular Movements: Extraocular movements intact.  Cardiovascular:     Rate and Rhythm: Normal rate.  Pulmonary:     Effort: Pulmonary effort is normal.  Musculoskeletal:        General: Normal range of motion.     Cervical back: Normal range of motion.  Neurological:     General: No focal deficit present.     Mental Status: She is alert. Mental status is at baseline.    ED Results / Procedures / Treatments   Labs (all labs ordered are listed, but only abnormal results are displayed) Labs Reviewed - No data to display  EKG EKG Interpretation  Date/Time:  Sunday July 05 2021 12:54:44 EST Ventricular Rate:  65 PR Interval:  126 QRS Duration: 102 QT Interval:  416 QTC Calculation: 433 R Axis:   87 Text Interpretation: Sinus rhythm Low voltage, precordial leads Minimal ST elevation, inferior leads Confirmed by Norman ClayHong, Yarden Hillis ([id number]) on 07/05/2021 1:34:58 PM  Radiology DG Chest Port 1 View  Result Date: 07/05/2021 CLINICAL DATA:  56 year old female with history of  cough and shortness of breath. EXAM: PORTABLE CHEST 1 VIEW COMPARISON:  Chest x-ray 12/16/2020. FINDINGS: Lung volumes are normal. No consolidative airspace disease. No pleural effusions. No pneumothorax. No pulmonary nodule or mass noted. Pulmonary vasculature and the cardiomediastinal silhouette are within normal limits. IMPRESSION: No radiographic evidence of acute cardiopulmonary disease. Electronically Signed   By: Trudie Reedaniel  Entrikin M.D.   On: 07/05/2021 13:25    Procedures Procedures    Medications Ordered in ED Medications  acetaminophen (TYLENOL) tablet 650 mg (650 mg Oral Given 07/05/21 1342)  ibuprofen (ADVIL) tablet 600 mg (600 mg Oral Given 07/05/21 1342)    ED Course/ Medical Decision Making/ A&P                           Medical Decision Making Amount and/or Complexity of Data Reviewed External Data Reviewed: notes.    Details: Review of past records show June 29, 2021 patient was seen in urgent care diagnosed with COVID. Radiology: ordered. ECG/medicine tests: ordered and independent interpretation performed. Decision-making details documented in ED Course.  Risk OTC drugs. Risk Details: Despite having COVID patient has very normal vitals here.  I doubt pulmonary embolism without pneumothorax doubt acute coronary syndrome given the history and work-up today.  EKG is unremarkable.  Given Tylenol Motrin with improvement of symptoms.  Recommending outpatient follow-up with her doctor within the week.  Recommending immediate return for worsening symptoms or any additional concerns.           Final Clinical Impression(s) / ED Diagnoses Final diagnoses:  None    Rx / DC Orders ED Discharge Orders     None         Cheryll CockayneHong, Rami Waddle S, MD 07/05/21 1406

## 2021-07-05 NOTE — ED Notes (Signed)
Pt states understanding of dc instructions, importance of follow up. Pt denies questions or concerns upon dc. Pt declined wheelchair assistance upon dc. Pt ambulated out of ed w/ steady gait. No belongings left in room upon dc.  Pt states husband is coming to get her.

## 2021-07-05 NOTE — ED Triage Notes (Signed)
Pt bib ems for sob, headache, and chest pain.  Pt tested + covid 06/29/21.  Pt states symptoms getting worse.

## 2021-07-15 ENCOUNTER — Ambulatory Visit (HOSPITAL_COMMUNITY)
Admission: RE | Admit: 2021-07-15 | Discharge: 2021-07-15 | Disposition: A | Discharge status: Home / Self Care | Payer: BC Managed Care – PPO | Source: Ambulatory Visit | Attending: Rheumatology | Admitting: Rheumatology

## 2021-07-15 VITALS — BP 134/84 | HR 65 | Temp 97.4°F | Resp 18

## 2021-07-15 DIAGNOSIS — M0579 Rheumatoid arthritis with rheumatoid factor of multiple sites without organ or systems involvement: Secondary | ICD-10-CM | POA: Insufficient documentation

## 2021-07-15 DIAGNOSIS — Z79899 Other long term (current) drug therapy: Secondary | ICD-10-CM | POA: Insufficient documentation

## 2021-07-15 DIAGNOSIS — M0609 Rheumatoid arthritis without rheumatoid factor, multiple sites: Secondary | ICD-10-CM | POA: Insufficient documentation

## 2021-07-15 LAB — COMPREHENSIVE METABOLIC PANEL
ALT: 19 U/L (ref 0–44)
AST: 21 U/L (ref 15–41)
Albumin: 3.4 g/dL — ABNORMAL LOW (ref 3.5–5.0)
Alkaline Phosphatase: 64 U/L (ref 38–126)
Anion gap: 9 (ref 5–15)
BUN: 10 mg/dL (ref 6–20)
CO2: 27 mmol/L (ref 22–32)
Calcium: 8.9 mg/dL (ref 8.9–10.3)
Chloride: 105 mmol/L (ref 98–111)
Creatinine, Ser: 0.92 mg/dL (ref 0.44–1.00)
GFR, Estimated: >60 mL/min (ref >60)
Glucose, Bld: 108 mg/dL — ABNORMAL HIGH (ref 70–99)
Potassium: 3.8 mmol/L (ref 3.5–5.1)
Sodium: 141 mmol/L (ref 135–145)
Total Bilirubin: 0.3 mg/dL (ref 0.3–1.2)
Total Protein: 6.6 g/dL (ref 6.5–8.1)

## 2021-07-15 LAB — CBC WITH DIFFERENTIAL/PLATELET
Abs Immature Granulocytes: 0.03 10*3/uL (ref 0.00–0.07)
Basophils Absolute: 0.1 10*3/uL (ref 0.0–0.1)
Basophils Relative: 1 %
Eosinophils Absolute: 0.2 10*3/uL (ref 0.0–0.5)
Eosinophils Relative: 2 %
HCT: 43.3 % (ref 36.0–46.0)
Hemoglobin: 13.4 g/dL (ref 12.0–15.0)
Immature Granulocytes: 0 %
Lymphocytes Relative: 53 %
Lymphs Abs: 4.1 10*3/uL — ABNORMAL HIGH (ref 0.7–4.0)
MCH: 26.4 pg (ref 26.0–34.0)
MCHC: 30.9 g/dL (ref 30.0–36.0)
MCV: 85.4 fL (ref 80.0–100.0)
Monocytes Absolute: 0.5 10*3/uL (ref 0.1–1.0)
Monocytes Relative: 6 %
Neutro Abs: 2.9 10*3/uL (ref 1.7–7.7)
Neutrophils Relative %: 38 %
Platelets: 289 10*3/uL (ref 150–400)
RBC: 5.07 MIL/uL (ref 3.87–5.11)
RDW: 12.9 % (ref 11.5–15.5)
WBC: 7.8 10*3/uL (ref 4.0–10.5)
nRBC: 0 % (ref 0.0–0.2)

## 2021-07-15 MED ORDER — SODIUM CHLORIDE 0.9 % IV SOLN
1000.0000 mg | INTRAVENOUS | Status: DC
  Administered 2021-07-15: 1000 mg via INTRAVENOUS
  Filled 2021-07-15: qty 40

## 2021-07-15 MED ORDER — DIPHENHYDRAMINE HCL 25 MG PO CAPS
25.0000 mg | ORAL_CAPSULE | ORAL | Status: DC
  Administered 2021-07-15: 25 mg via ORAL

## 2021-07-15 MED ORDER — ACETAMINOPHEN 325 MG PO TABS
650.0000 mg | ORAL_TABLET | ORAL | Status: DC
  Administered 2021-07-15: 650 mg via ORAL

## 2021-07-15 MED ORDER — ACETAMINOPHEN 325 MG PO TABS
ORAL_TABLET | ORAL | Status: AC
  Filled 2021-07-15: qty 2

## 2021-07-15 MED ORDER — DIPHENHYDRAMINE HCL 25 MG PO CAPS
ORAL_CAPSULE | ORAL | Status: AC
  Filled 2021-07-15: qty 1

## 2021-07-15 NOTE — Progress Notes (Signed)
CBC and CMP normal

## 2021-07-16 ENCOUNTER — Other Ambulatory Visit: Payer: Self-pay | Admitting: Physician Assistant

## 2021-07-16 NOTE — Telephone Encounter (Signed)
Next Visit: 08/21/2021  Last Visit: 03/20/2021  Last Fill: 04/20/2021  Dx: Chronic pain of right knee   Current Dose per office note on 03/20/2021:   Okay to refill Voltaren Gel?

## 2021-07-22 ENCOUNTER — Ambulatory Visit: Payer: BC Managed Care – PPO | Admitting: Physician Assistant

## 2021-07-29 NOTE — Progress Notes (Signed)
Office Visit Note  Patient: [name] [name]             Date of Birth: [birth year]           MRN: [id number]             PCP: [name], MD Referring: [name], MD Visit Date: 08/12/2021 Occupation: @GUAROCC @  Subjective:  Pain in multiple joints  History of Present Illness: [name] [name] is a 56 y.o. female with history of seropositive rheumatoid arthritis, osteoarthritis and fibromyalgia syndrome.  She states she developed COVID-19 virus infection in January 2023.  He was treated with Paxlovid.  She was off medications including Orencia and methotrexate for about 3 weeks.  She had a flare after coming off the medications.  The symptoms gradually improved.  She states she is having increased pain and discomfort in her right trochanteric area and also her right knee.  She notices some discomfort in her wrist joints as well.  Continues to have generalized pain and discomfort from fibromyalgia.  She continues to have insomnia and fatigue.  Activities of Daily Living:  Patient reports morning stiffness for 2-3 hours.   Patient Reports nocturnal pain.  Difficulty dressing/grooming: Reports Difficulty climbing stairs: Reports Difficulty getting out of chair: Reports Difficulty using hands for taps, buttons, cutlery, and/or writing: Reports  Review of Systems  Constitutional:  Positive for fatigue.  HENT:  Positive for mouth dryness. Negative for mouth sores and nose dryness.   Eyes:  Positive for itching and dryness. Negative for pain.  Respiratory:  Negative for shortness of breath and difficulty breathing.   Cardiovascular:  Positive for chest pain and palpitations.  Gastrointestinal:  Negative for blood in stool, constipation and diarrhea.  Endocrine: Negative for increased urination.  Genitourinary:  Negative for difficulty urinating.  Musculoskeletal:  Positive for joint pain, joint pain, joint swelling, myalgias, morning stiffness, muscle tenderness and  myalgias.  Skin:  Negative for color change, rash and redness.  Allergic/Immunologic: Positive for susceptible to infections.  Neurological:  Positive for dizziness, numbness, headaches, memory loss and weakness.  Hematological:  Positive for bruising/bleeding tendency.  Psychiatric/Behavioral:  Positive for confusion.    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   Dyslipidemia 01/30/2015   MDD (major depressive disorder), recurrent severe, 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   Essential hypertension 08/25/2014   Morbid obesity (HCC) 08/25/2014   CAD (coronary artery disease), native coronary artery 08/24/2014   Numbness and tingling in left arm    Arm numbness left 08/22/2014   Chest pain 08/22/2014   Gastroesophageal reflux disease without esophagitis 07/23/2014   Coronary artery calcification seen on CAT scan 02/19/2014   Hip pain 10/23/2013   Solitary pulmonary nodule 08/28/2013   Chest pain, atypical 07/18/2013   Heart palpitations 06/27/2013   Cough 06/04/2013   Diastolic dysfunction 05/18/2013   SOB (shortness of breath) 05/09/2013   Other malaise and fatigue 01/21/2013   Stress  and adjustment reaction 12/09/2012   Right sided abdominal pain 11/29/2012   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/21/2012   Morbid obesity with BMI of 50.0-59.9, adult ([place]) 11/21/2012   Arthritis 07/13/2012   Depression 07/13/2012    Routine health maintenance 05/07/2012   Migraine 06/07/1998    Past Medical History:  Diagnosis Date   Anxiety    Bursitis    Coronary artery calcification seen on CAT scan    minimal CAD with 30% prox and mild LAD   Depression    Diastolic dysfunction    Dyslipidemia 01/30/2015   Esophageal ring    Fibromyalgia    Hiatal hernia    HSV-1 infection    Morbid obesity ([place])    Osteoarthritis    Rheumatoid arthritis(714.0)    M05.79   Rheumatoid arthritis, seropositive, 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[place])    Treated with Orencia, TB neg 09/12/2015   Spondylolysis     Family History  Problem Relation Age of Onset   Hyperlipidemia Mother    Depression Mother    Sarcoidosis Father    Lung disease Father        Pleural Mesothelioma   Cancer Father    Heart attack Paternal Grandmother    Hypertension Paternal Grandmother    Hypertension Maternal Grandmother    Diabetes Maternal Grandmother    Stroke Neg Hx    Colon cancer Neg Hx    Esophageal cancer Neg Hx    Rectal cancer Neg Hx    Stomach cancer Neg Hx    Past Surgical History:  Procedure Laterality Date   CARDIAC CATHETERIZATION N/A 06/11/2016   Procedure: Left Heart Cath and Coronary Angiography;  Surgeon: Jettie Booze, MD;  Location: [place] [facility];  Service: Cardiovascular;  Laterality: N/A;   CESAREAN SECTION     COLONOSCOPY  07/27/2017   ESOPHAGEAL MANOMETRY N/A 10/28/2014   Procedure: ESOPHAGEAL MANOMETRY (EM);  Surgeon: Irene Shipper, MD;  Location: [facility];  Service: Endoscopy;  Laterality: N/A;   HERNIA REPAIR     reports surgery on 3 hernias, with 2 more present   LAPAROSCOPIC GASTRIC SLEEVE RESECTION  11/21/12   [facility]   LEFT HEART CATHETERIZATION WITH CORONARY ANGIOGRAM N/A 08/26/2014   Procedure: LEFT HEART CATHETERIZATION WITH CORONARY ANGIOGRAM;  Surgeon: Lorretta Harp, MD;  Location: [facility];  Service: Cardiovascular;  Laterality: N/A;   TUBAL LIGATION     Social History   Social History  Narrative   Marital Status: Married Aeronautical engineer)    Children:  Son Marcello Moores) Daughter Margreta Journey)    Pets: None    Living Situation: Lives with husband and children    Occupation: Radiation protection practitioner Scientist, clinical (histocompatibility and immunogenetics))     Education: Dietitian (Psychology)     Tobacco Use/Exposure:  None    Alcohol Use:  Occasional   Drug Use:  None   Diet:  Regular   Exercise: Walking or Treadmill (2 x week)   Hobbies: Barrister's clerk, Christmas Decorations.               Immunization History  Administered Date(s) Administered   PFIZER(Purple Top)SARS-COV-2 Vaccination 08/30/2019, 09/20/2019   Tdap 09/21/2016     Objective: Vital Signs: BP (!) 141/95 (BP Location: Right Wrist, Patient Position: Sitting, Cuff Size: Normal)    Pulse 76    Ht 5\' 2"  (1.575 m)    Wt 270 lb 6.4 oz (122.7 kg)    BMI 49.46 kg/m    Physical Exam Vitals and nursing note reviewed.  Constitutional:      Appearance: She is well-developed.  HENT:     Head: Normocephalic and atraumatic.  Eyes:     Conjunctiva/sclera: Conjunctivae normal.  Cardiovascular:     Rate and Rhythm: Normal rate and regular rhythm.     Heart sounds: Normal heart sounds.  Pulmonary:     Effort: Pulmonary effort is normal.     Breath sounds: Normal breath sounds.  Abdominal:     General: Bowel sounds are normal.     Palpations: Abdomen is soft.  Musculoskeletal:     Cervical back: Normal range of motion.  Lymphadenopathy:     Cervical: No cervical adenopathy.  Skin:    General: Skin is warm and dry.     Capillary Refill: Capillary refill takes less than 2 seconds.  Neurological:     Mental Status: She is alert and oriented to person, place, and time.  Psychiatric:        Behavior: Behavior normal.     Musculoskeletal Exam: C-spine was in good range of motion.  Shoulder joints were in good range of motion with discomfort.  Elbow joints and wrist joints in good range of motion.  She had tenderness over right third MCP joint.  No  synovitis was noted.  Hip joints and knee joints in good range of motion.  She had tenderness over right trochanteric bursa.  She complains of discomfort in her bilateral knee joints.  No warmth swelling or effusion was noted.  There was no tenderness over ankles or MTPs.  She had generalized hyperalgesia and positive tender points.  CDAI Exam: CDAI Score: 6.1  Patient Global: 7 mm; Provider Global: 4 mm Swollen: 0 ; Tender: 5  Joint Exam 08/12/2021      Right  Left  Glenohumeral   Tender   Tender  MCP 3   Tender     Knee   Tender   Tender     Investigation: No additional findings.  Imaging: No results found.  Recent Labs: Lab Results  Component Value Date   WBC 7.8 07/15/2021   HGB 13.4 07/15/2021   PLT 289 07/15/2021   NA 141 07/15/2021   K 3.8 07/15/2021   CL 105 07/15/2021   CO2 27 07/15/2021   GLUCOSE 108 (H) 07/15/2021   BUN 10 07/15/2021   CREATININE 0.92 07/15/2021   BILITOT 0.3 07/15/2021   ALKPHOS 64 07/15/2021   AST 21 07/15/2021   ALT 19 07/15/2021   PROT 6.6 07/15/2021   ALBUMIN 3.4 (L) 07/15/2021   CALCIUM 8.9 07/15/2021   GFRAA >60 12/26/2019   QFTBGOLD Negative 09/01/2016   QFTBGOLDPLUS Negative 08/26/2020    Speciality Comments: ORENCIA 1000 mg x 4 weeks  Procedures:  No procedures performed Allergies: Influenza vaccines, Amitriptyline, and Isosorbide   Assessment / Plan:     Visit Diagnoses: Rheumatoid arthritis involving multiple sites with positive rheumatoid factor (HCC)-she is having increased pain and discomfort has she is due for her Orencia infusion today.  She states that she stopped Orencia and methotrexate in January due to COVID-19 infection.  She resumed medications after 3 weeks.  She states she had a flare.  She has not completely recovered from that.  High risk medication use - IV Orencia 1,000 mg infusions every 28 days.  Rasuvo 25 mg per 0.5 mL subcutaneous weekly.  Folic acid.  Labs from February 2023 were reviewed.  CBC with  differential and CMP with GFR were normal.  TB gold was negative on August 26, 2020.  We will repeat TB Gold today with her infusion.  Her labs are repeated every 3 months with infusions.  Information regarding immunization was placed in the AVS.  She was advised to hold methotrexate and Orencia in case she develops an infection and resume after the infection resolves.  Primary osteoarthritis of both knees - MRI of the right knee on 05/23/2020 which was negative for meniscal or ligament tear.  She continues to have pain and discomfort in the bilateral knee joints  Trochanteric bursitis of both hips-she had tenderness over bilateral trochanteric bursa.  IT band stretches were discussed.  Fibromyalgia -she continues to have generalized pain and discomfort from fibromyalgia.  Need for regular exercise was emphasized.  She is on gabapentin 300 mg 1 capsule 3 times daily and Flexeril 10 mg at bedtime as needed for muscle spasms.    Other insomnia-sleep hygiene was discussed.  Other fatigue-unrelated to fibromyalgia and insomnia.  History of hypertension-blood pressure was elevated.  She was advised to monitor blood pressure closely.  Other medical problems are listed as follows.  History of coronary artery disease  Dyslipidemia-increased risk of heart disease with rheumatoid arthritis was discussed.  Dietary modifications and exercise was emphasized.  History of diastolic dysfunction  History of depression  Solitary pulmonary nodule  S/P laparoscopic sleeve gastrectomy  History of vitamin D deficiency-she is on vitamin D supplement.  COVID-19 virus infection - 06/2021.  Patient was treated with Paxlovid.  She is completely recovered from it.  Orders: No orders of the defined types were placed in this encounter.  Meds ordered this encounter  Medications   Methotrexate, PF, (RASUVO) 25 MG/0.5ML SOAJ    Sig: INJECT ONE PEN SUBCUTANEOUSLY ONCE EVERY WEEK. STORE AT ROOM TEMPERATURE BETWEEN  68 - 24 DEGREES F.    Dispense:  12 mL    Refill:  0     Follow-Up Instructions: Return in about 3 months (around 11/12/2021) for Rheumatoid arthritis.   Bo Merino, MD  Note - This record has been created using Editor, commissioning.  Chart creation errors have been sought, but may not always  have been located. Such creation errors do not reflect on  the standard of medical care.

## 2021-08-11 NOTE — Addendum Note (Signed)
Addended by: Murrell Redden on: 08/11/2021 02:57 PM   Modules accepted: Orders

## 2021-08-11 NOTE — Progress Notes (Signed)
Order placed for TB gold to be drawn with infusion tomorrow. Last TB gold was negative 08/26/20.  Chesley Mires, PharmD, MPH, BCPS Clinical Pharmacist (Rheumatology and Pulmonology)

## 2021-08-12 ENCOUNTER — Ambulatory Visit (INDEPENDENT_AMBULATORY_CARE_PROVIDER_SITE_OTHER): Payer: BC Managed Care – PPO | Admitting: Rheumatology

## 2021-08-12 ENCOUNTER — Ambulatory Visit (HOSPITAL_COMMUNITY)
Admission: RE | Admit: 2021-08-12 | Discharge: 2021-08-12 | Disposition: A | Discharge status: Home / Self Care | Payer: BC Managed Care – PPO | Source: Ambulatory Visit | Attending: Rheumatology | Admitting: Rheumatology

## 2021-08-12 ENCOUNTER — Encounter: Payer: Self-pay | Admitting: Rheumatology

## 2021-08-12 ENCOUNTER — Other Ambulatory Visit: Payer: Self-pay

## 2021-08-12 ENCOUNTER — Other Ambulatory Visit: Payer: Self-pay | Admitting: Pharmacist

## 2021-08-12 VITALS — BP 141/95 | HR 76 | Ht 62.0 in | Wt 270.4 lb

## 2021-08-12 DIAGNOSIS — M0579 Rheumatoid arthritis with rheumatoid factor of multiple sites without organ or systems involvement: Secondary | ICD-10-CM

## 2021-08-12 DIAGNOSIS — M7062 Trochanteric bursitis, left hip: Secondary | ICD-10-CM

## 2021-08-12 DIAGNOSIS — M170 Bilateral primary osteoarthritis of knee: Secondary | ICD-10-CM

## 2021-08-12 DIAGNOSIS — R911 Solitary pulmonary nodule: Secondary | ICD-10-CM

## 2021-08-12 DIAGNOSIS — Z79899 Other long term (current) drug therapy: Secondary | ICD-10-CM

## 2021-08-12 DIAGNOSIS — G4709 Other insomnia: Secondary | ICD-10-CM

## 2021-08-12 DIAGNOSIS — Z8639 Personal history of other endocrine, nutritional and metabolic disease: Secondary | ICD-10-CM

## 2021-08-12 DIAGNOSIS — Z8679 Personal history of other diseases of the circulatory system: Secondary | ICD-10-CM

## 2021-08-12 DIAGNOSIS — Z9884 Bariatric surgery status: Secondary | ICD-10-CM

## 2021-08-12 DIAGNOSIS — M797 Fibromyalgia: Secondary | ICD-10-CM

## 2021-08-12 DIAGNOSIS — Z111 Encounter for screening for respiratory tuberculosis: Secondary | ICD-10-CM | POA: Diagnosis present

## 2021-08-12 DIAGNOSIS — E785 Hyperlipidemia, unspecified: Secondary | ICD-10-CM

## 2021-08-12 DIAGNOSIS — M7061 Trochanteric bursitis, right hip: Secondary | ICD-10-CM

## 2021-08-12 DIAGNOSIS — R5383 Other fatigue: Secondary | ICD-10-CM

## 2021-08-12 DIAGNOSIS — U071 COVID-19: Secondary | ICD-10-CM

## 2021-08-12 DIAGNOSIS — Z8659 Personal history of other mental and behavioral disorders: Secondary | ICD-10-CM

## 2021-08-12 MED ORDER — SODIUM CHLORIDE 0.9 % IV SOLN
1000.0000 mg | INTRAVENOUS | Status: DC
  Administered 2021-08-12: 1000 mg via INTRAVENOUS
  Filled 2021-08-12: qty 40

## 2021-08-12 MED ORDER — DIPHENHYDRAMINE HCL 25 MG PO CAPS
ORAL_CAPSULE | ORAL | Status: AC
  Filled 2021-08-12: qty 1

## 2021-08-12 MED ORDER — ACETAMINOPHEN 325 MG PO TABS
ORAL_TABLET | ORAL | Status: AC
  Filled 2021-08-12: qty 2

## 2021-08-12 MED ORDER — ACETAMINOPHEN 325 MG PO TABS
650.0000 mg | ORAL_TABLET | ORAL | Status: DC
  Administered 2021-08-12: 650 mg via ORAL

## 2021-08-12 MED ORDER — RASUVO 25 MG/0.5ML ~~LOC~~ SOAJ: SUBCUTANEOUS | 0 refills | Status: DC

## 2021-08-12 MED ORDER — DIPHENHYDRAMINE HCL 25 MG PO CAPS
25.0000 mg | ORAL_CAPSULE | ORAL | Status: DC
  Administered 2021-08-12: 25 mg via ORAL

## 2021-08-12 NOTE — Patient Instructions (Signed)

## 2021-08-12 NOTE — Progress Notes (Incomplete)
Next infusion scheduled for Orencia IV on 09/09/21 and due for updated orders. Diagnosis: RA  Dose: 1000mg  every 28 days (appropriate based on last recorded weight 122.7 kg)  Last Clinic Visit: 08/12/21 Next Clinic Visit: not yet scheduled  Last infusion: 08/12/21  Labs: CBC and CMP on 07/15/21 TB Gold: drawn today, pending   Orders placed for Orencia IV x 3 doses along with premedication of acetaminophen and diphenhydramine to be administered 30 minutes before medication infusion.  Standing CBC with diff/platelet and CMP with GFR orders placed to be drawn every 2 months.  Next TB gold due 08/13/22  Knox Saliva, PharmD, MPH, BCPS Clinical Pharmacist (Rheumatology and Pulmonology)

## 2021-08-14 LAB — QUANTIFERON-TB GOLD PLUS: QuantiFERON-TB Gold Plus: NEGATIVE

## 2021-08-14 LAB — QUANTIFERON-TB GOLD PLUS (RQFGPL)
QuantiFERON Mitogen Value: 6.7 IU/mL
QuantiFERON Nil Value: 0.05 IU/mL
QuantiFERON TB1 Ag Value: 0.19 IU/mL
QuantiFERON TB2 Ag Value: 0.09 IU/mL

## 2021-08-17 NOTE — Progress Notes (Signed)
TB gold negative

## 2021-08-21 ENCOUNTER — Ambulatory Visit: Payer: BC Managed Care – PPO | Admitting: Rheumatology

## 2021-09-09 ENCOUNTER — Ambulatory Visit (HOSPITAL_COMMUNITY)
Admission: RE | Admit: 2021-09-09 | Discharge: 2021-09-09 | Disposition: A | Discharge status: Home / Self Care | Payer: BC Managed Care – PPO | Source: Ambulatory Visit | Attending: Rheumatology | Admitting: Rheumatology

## 2021-09-09 DIAGNOSIS — M0579 Rheumatoid arthritis with rheumatoid factor of multiple sites without organ or systems involvement: Secondary | ICD-10-CM

## 2021-09-09 DIAGNOSIS — Z79899 Other long term (current) drug therapy: Secondary | ICD-10-CM | POA: Diagnosis present

## 2021-09-09 LAB — CBC WITH DIFFERENTIAL/PLATELET
Abs Immature Granulocytes: 0.04 10*3/uL (ref 0.00–0.07)
Basophils Absolute: 0.1 10*3/uL (ref 0.0–0.1)
Basophils Relative: 1 %
Eosinophils Absolute: 0.2 10*3/uL (ref 0.0–0.5)
Eosinophils Relative: 2 %
HCT: 44 % (ref 36.0–46.0)
Hemoglobin: 13.5 g/dL (ref 12.0–15.0)
Immature Granulocytes: 0 %
Lymphocytes Relative: 45 %
Lymphs Abs: 4.4 10*3/uL — ABNORMAL HIGH (ref 0.7–4.0)
MCH: 26.4 pg (ref 26.0–34.0)
MCHC: 30.7 g/dL (ref 30.0–36.0)
MCV: 85.9 fL (ref 80.0–100.0)
Monocytes Absolute: 0.6 10*3/uL (ref 0.1–1.0)
Monocytes Relative: 6 %
Neutro Abs: 4.5 10*3/uL (ref 1.7–7.7)
Neutrophils Relative %: 46 %
Platelets: 284 10*3/uL (ref 150–400)
RBC: 5.12 MIL/uL — ABNORMAL HIGH (ref 3.87–5.11)
RDW: 12.8 % (ref 11.5–15.5)
WBC: 9.8 10*3/uL (ref 4.0–10.5)
nRBC: 0 % (ref 0.0–0.2)

## 2021-09-09 LAB — COMPREHENSIVE METABOLIC PANEL
ALT: 15 U/L (ref 0–44)
AST: 20 U/L (ref 15–41)
Albumin: 3.4 g/dL — ABNORMAL LOW (ref 3.5–5.0)
Alkaline Phosphatase: 72 U/L (ref 38–126)
Anion gap: 7 (ref 5–15)
BUN: 10 mg/dL (ref 6–20)
CO2: 27 mmol/L (ref 22–32)
Calcium: 9.3 mg/dL (ref 8.9–10.3)
Chloride: 107 mmol/L (ref 98–111)
Creatinine, Ser: 0.91 mg/dL (ref 0.44–1.00)
GFR, Estimated: >60 mL/min (ref >60)
Glucose, Bld: 107 mg/dL — ABNORMAL HIGH (ref 70–99)
Potassium: 3.9 mmol/L (ref 3.5–5.1)
Sodium: 141 mmol/L (ref 135–145)
Total Bilirubin: 0.5 mg/dL (ref 0.3–1.2)
Total Protein: 6.6 g/dL (ref 6.5–8.1)

## 2021-09-09 MED ORDER — DIPHENHYDRAMINE HCL 25 MG PO CAPS: 25.0000 mg | ORAL_CAPSULE | ORAL | Status: DC

## 2021-09-09 MED ORDER — ACETAMINOPHEN 325 MG PO TABS
ORAL_TABLET | ORAL | Status: AC
  Administered 2021-09-09: 650 mg via ORAL
  Filled 2021-09-09: qty 2

## 2021-09-09 MED ORDER — DIPHENHYDRAMINE HCL 25 MG PO CAPS
ORAL_CAPSULE | ORAL | Status: AC
  Administered 2021-09-09: 25 mg via ORAL
  Filled 2021-09-09: qty 1

## 2021-09-09 MED ORDER — SODIUM CHLORIDE 0.9 % IV SOLN
1000.0000 mg | INTRAVENOUS | Status: DC
  Administered 2021-09-09: 1000 mg via INTRAVENOUS
  Filled 2021-09-09: qty 40

## 2021-09-09 MED ORDER — ACETAMINOPHEN 325 MG PO TABS: 650.0000 mg | ORAL_TABLET | ORAL | Status: DC

## 2021-09-09 NOTE — Progress Notes (Signed)
CBC and CMP stable.

## 2021-09-21 ENCOUNTER — Other Ambulatory Visit: Payer: Self-pay | Admitting: Physician Assistant

## 2021-09-21 ENCOUNTER — Ambulatory Visit (INDEPENDENT_AMBULATORY_CARE_PROVIDER_SITE_OTHER): Payer: BC Managed Care – PPO | Admitting: Physician Assistant

## 2021-09-21 ENCOUNTER — Telehealth: Payer: Self-pay

## 2021-09-21 ENCOUNTER — Ambulatory Visit (INDEPENDENT_AMBULATORY_CARE_PROVIDER_SITE_OTHER): Payer: BC Managed Care – PPO

## 2021-09-21 ENCOUNTER — Encounter: Payer: Self-pay | Admitting: Physician Assistant

## 2021-09-21 VITALS — Ht 62.0 in | Wt 265.4 lb

## 2021-09-21 DIAGNOSIS — M25561 Pain in right knee: Secondary | ICD-10-CM

## 2021-09-21 MED ORDER — LIDOCAINE HCL 1 % IJ SOLN
3.0000 mL | INTRAMUSCULAR | Status: AC | PRN
  Administered 2021-09-21: 3 mL

## 2021-09-21 MED ORDER — ACETAMINOPHEN-CODEINE #3 300-30 MG PO TABS: 1.0000 | ORAL_TABLET | Freq: Four times a day (QID) | ORAL | 0 refills | Status: DC | PRN

## 2021-09-21 MED ORDER — METHYLPREDNISOLONE ACETATE 40 MG/ML IJ SUSP
40.0000 mg | INTRAMUSCULAR | Status: AC | PRN
  Administered 2021-09-21: 40 mg via INTRA_ARTICULAR

## 2021-09-21 NOTE — Progress Notes (Signed)
? ?  Procedure Note ? ?Patient: [name] [name]             ?Date of Birth: Nov 02, 1965           ?MRN: [id number]             ?Visit Date: 09/21/2021 ? ?HPI: [name] [name] comes in today for right knee pain.  She was last seen on 06/10/2021 for her right knee pain.  She has tried supplemental injections in the past really have not given her much relief.  She notes that the pain started in the right knee couple weeks ago.  She notes stiffness and some clicking in the knee.  She is hurting around the kneecap and medial aspect of the knee.  No new injury.  Pain keeps her awake at night.  She has known mild arthritis involving the medial compartment and patellofemoral compartment based on prior MRI.  Last hemoglobin A1c was 5.9 on 09/11/2021.  ? ? ?Review of systems see HPI otherwise negative ? ?Physical exam: General well-developed well-nourished pleasant female in no acute distress. ?Right knee: Full extension full flexion.  Patellofemoral crepitus.  Slight hyperextension of the right knee.  No laxity otherwise.  Tenderness along medial joint line and peripatellar region. ? ?Radiographs: 2 views right knee: No acute fractures or acute findings.  Knee is well located.  Mild patellofemoral and medial compartmental arthritic changes. ? ?Procedures: ?Visit Diagnoses:  ?1. Right knee pain, unspecified chronicity   ? ? ?Large Joint Inj: R knee on 09/21/2021 11:51 AM ?Indications: pain ?Details: 22 G 1.5 in needle, anterolateral approach ? ?Arthrogram: No ? ?Medications: 3 mL lidocaine 1 %; 40 mg methylPREDNISolone acetate 40 MG/ML ?Outcome: tolerated well, no immediate complications ?Procedure, treatment alternatives, risks and benefits explained, specific risks discussed. Consent was given by the patient. Immediately prior to procedure a time out was called to verify the correct patient, procedure, equipment, support staff and site/side marked as required. Patient was prepped and draped in the usual sterile fashion.   ? ? ? ?Plan: Discussed with her quad strengthening exercises and knee friendly exercises.  She tolerated the cortisone injection well today.  She will watch her glucose levels closely over the next 24 to 48 hours.  She understands to wait least 3 months between injections. ? ?

## 2021-09-21 NOTE — Telephone Encounter (Signed)
Pt would like a couple days worth of the tylenol #3. Please advise ?

## 2021-10-05 ENCOUNTER — Emergency Department (HOSPITAL_COMMUNITY)
Admission: EM | Admit: 2021-10-05 | Discharge: 2021-10-05 | Discharge status: Against Medical Advice | Payer: BC Managed Care – PPO | Attending: Emergency Medicine | Admitting: Emergency Medicine

## 2021-10-05 ENCOUNTER — Other Ambulatory Visit: Payer: Self-pay

## 2021-10-05 ENCOUNTER — Encounter (HOSPITAL_COMMUNITY): Payer: Self-pay

## 2021-10-05 ENCOUNTER — Emergency Department (HOSPITAL_COMMUNITY): Payer: BC Managed Care – PPO

## 2021-10-05 DIAGNOSIS — R200 Anesthesia of skin: Secondary | ICD-10-CM | POA: Insufficient documentation

## 2021-10-05 DIAGNOSIS — Z5321 Procedure and treatment not carried out due to patient leaving prior to being seen by health care provider: Secondary | ICD-10-CM | POA: Insufficient documentation

## 2021-10-05 DIAGNOSIS — R079 Chest pain, unspecified: Secondary | ICD-10-CM | POA: Diagnosis not present

## 2021-10-05 NOTE — ED Provider Triage Note (Signed)
Emergency Medicine Provider Triage Evaluation Note ? ?[name] , a 56 y.o. female  was evaluated in triage.  Pt complains of cp. Pt developed pain in chest and numbness throughout body when she thought about her son who died from an MVC 2 years ago.   ? ?Review of Systems  ?Positive: Cp, sob, numbness ?Negative: Fever, cough ? ?Physical Exam  ?BP 139/90   Pulse 82   Temp 97.7 ?F (36.5 ?C) (Oral)   Resp (!) 29   SpO2 97%  ?Gen:   Awake, tearful ?Resp:  Normal effort  ?MSK:   Moves extremities without difficulty  ?Other:   ? ?Medical Decision Making  ?Medically screening exam initiated at 10:22 PM.  Appropriate orders placed.  [name] was informed that the remainder of the evaluation will be completed by another provider, this initial triage assessment does not replace that evaluation, and the importance of remaining in the ED until their evaluation is complete. ? ?Likely panic attack.   ?  ?Fayrene Helper, PA-C ?10/05/21 2230 ? ?

## 2021-10-05 NOTE — ED Triage Notes (Signed)
Pt reports with sharp chest pain that radiates down her left arm x 35 mins ago.  ?

## 2021-10-05 NOTE — ED Notes (Signed)
Pt told staff that she was leaving and would just follow up with her provider.  ?

## 2021-10-07 ENCOUNTER — Ambulatory Visit (HOSPITAL_COMMUNITY)
Admission: RE | Admit: 2021-10-07 | Discharge: 2021-10-07 | Disposition: A | Discharge status: Home / Self Care | Payer: BC Managed Care – PPO | Source: Ambulatory Visit | Attending: Rheumatology | Admitting: Rheumatology

## 2021-10-07 DIAGNOSIS — M0579 Rheumatoid arthritis with rheumatoid factor of multiple sites without organ or systems involvement: Secondary | ICD-10-CM | POA: Insufficient documentation

## 2021-10-07 MED ORDER — SODIUM CHLORIDE 0.9 % IV SOLN
1000.0000 mg | INTRAVENOUS | Status: DC
  Administered 2021-10-07: 1000 mg via INTRAVENOUS
  Filled 2021-10-07: qty 40

## 2021-10-07 MED ORDER — DIPHENHYDRAMINE HCL 25 MG PO CAPS
ORAL_CAPSULE | ORAL | Status: AC
  Filled 2021-10-07: qty 1

## 2021-10-07 MED ORDER — ACETAMINOPHEN 325 MG PO TABS
650.0000 mg | ORAL_TABLET | ORAL | Status: DC
  Administered 2021-10-07: 650 mg via ORAL

## 2021-10-07 MED ORDER — DIPHENHYDRAMINE HCL 25 MG PO CAPS
25.0000 mg | ORAL_CAPSULE | ORAL | Status: DC
  Administered 2021-10-07: 25 mg via ORAL

## 2021-10-07 MED ORDER — ACETAMINOPHEN 325 MG PO TABS
ORAL_TABLET | ORAL | Status: AC
  Filled 2021-10-07: qty 2

## 2021-10-07 NOTE — Progress Notes (Signed)
Pt here today for orencia.  She had an anxiety attack and was seen in the ER and they did a chest xray  5/1... It says borderline to mild cardiomegaly.Marland Kitchen.Marland Kitchen.She is going to a cardiologist next week.  [facility] office and orders received that it is ok to proceed with orencia today per Sherron Alesale Taylor. ?

## 2021-10-08 ENCOUNTER — Ambulatory Visit
Admission: EM | Admit: 2021-10-08 | Discharge: 2021-10-08 | Disposition: A | Discharge status: Home / Self Care | Payer: BC Managed Care – PPO | Attending: Physician Assistant | Admitting: Physician Assistant

## 2021-10-08 DIAGNOSIS — R070 Pain in throat: Secondary | ICD-10-CM | POA: Diagnosis not present

## 2021-10-08 DIAGNOSIS — R0981 Nasal congestion: Secondary | ICD-10-CM | POA: Diagnosis not present

## 2021-10-08 DIAGNOSIS — B9789 Other viral agents as the cause of diseases classified elsewhere: Secondary | ICD-10-CM | POA: Diagnosis present

## 2021-10-08 DIAGNOSIS — R052 Subacute cough: Secondary | ICD-10-CM | POA: Diagnosis present

## 2021-10-08 DIAGNOSIS — J988 Other specified respiratory disorders: Secondary | ICD-10-CM

## 2021-10-08 DIAGNOSIS — R519 Headache, unspecified: Secondary | ICD-10-CM

## 2021-10-08 DIAGNOSIS — R0789 Other chest pain: Secondary | ICD-10-CM | POA: Diagnosis present

## 2021-10-08 DIAGNOSIS — J309 Allergic rhinitis, unspecified: Secondary | ICD-10-CM | POA: Diagnosis present

## 2021-10-08 LAB — POCT RAPID STREP A (OFFICE): Rapid Strep A Screen: NEGATIVE

## 2021-10-08 MED ORDER — CETIRIZINE HCL 10 MG PO TABS: 10.0000 mg | ORAL_TABLET | Freq: Every day | ORAL | 0 refills | Status: AC

## 2021-10-08 MED ORDER — PREDNISONE 20 MG PO TABS: ORAL_TABLET | ORAL | 0 refills | Status: DC

## 2021-10-08 MED ORDER — PSEUDOEPHEDRINE HCL 60 MG PO TABS: 60.0000 mg | ORAL_TABLET | Freq: Three times a day (TID) | ORAL | 0 refills | Status: AC | PRN

## 2021-10-08 MED ORDER — PROMETHAZINE-DM 6.25-15 MG/5ML PO SYRP: 2.5000 mL | ORAL_SOLUTION | Freq: Four times a day (QID) | ORAL | 0 refills | Status: DC | PRN

## 2021-10-08 NOTE — ED Provider Notes (Signed)
?[facility] ? ? ?MRN: [id number] DOB: [birth year] ? ?Subjective:  ? ?[name] [name] is a 56 y.o. female presenting for 1 day history of acute onset throat pain, sinus headaches, coughing that elicits chest discomfort, sinus congestion, sinus pressure.  Patient reports a history of difficult allergies but is not taking anything for this.  She did try some Mucinex and Tylenol yesterday.  She also reports a history of sinus infections.  Would like to make sure she does not have strep throat.  No shortness of breath or wheezing, no history of asthma or respiratory disorders.  She is not a smoker.  No history of arrhythmias.  No history of diabetes. ? ?No current facility-administered medications for this encounter. ? ?Current Outpatient Medications:  ?  Abatacept (ORENCIA IV), Inject 1,000 mg into the vein every 28 (twenty-eight) days., Disp: , Rfl:  ?  acetaminophen-codeine (TYLENOL #3) 300-30 MG tablet, Take 1 tablet by mouth every 6 (six) hours as needed for moderate pain., Disp: 10 tablet, Rfl: 0 ?  Adapalene 0.3 % gel, Apply 1 application topically daily as needed (acne). , Disp: , Rfl:  ?  Alcohol Swabs (ALCOHOL WIPES) 70 % PADS, Alcohol Prep Pads, Disp: , Rfl:  ?  atorvastatin (LIPITOR) 80 MG tablet, Add'l Sig Add'l Sig oral Add'l Sig, Disp: , Rfl:  ?  azelastine (ASTELIN) 0.1 % nasal spray, Place 2 sprays into both nostrils 2 (two) times daily. (Patient taking differently: Place 2 sprays into both nostrils as needed.), Disp: 30 mL, Rfl: 0 ?  benzonatate (TESSALON) 100 MG capsule, Take 1 capsule (100 mg total) by mouth 2 (two) times daily as needed for cough., Disp: 20 capsule, Rfl: 0 ?  buPROPion (WELLBUTRIN XL) 150 MG 24 hr tablet, Take 450 mg by mouth every morning., Disp: , Rfl:  ?  cetirizine-pseudoephedrine (ZYRTEC-D) 5-120 MG tablet, Take 1 tablet by mouth 2 (two) times daily., Disp: 30 tablet, Rfl: 0 ?  cyclobenzaprine (FLEXERIL) 10 MG tablet, TAKE ONE TABLET BY MOUTH EVERY NIGHT AT  BEDTIME AS NEEDED FOR MUSCLE SPASMS, Disp: 30 tablet, Rfl: 0 ?  diclofenac Sodium (VOLTAREN) 1 % GEL, APPLY 2-4 GRAMS TO AFFECTED JOINT 4 TIMES DAILY AS NEEDED., Disp: 400 g, Rfl: 2 ?  diltiazem (CARDIZEM CD) 180 MG 24 hr capsule, Take 1 capsule (180 mg total) by mouth daily., Disp: 15 capsule, Rfl: 0 ?  doxepin (SINEQUAN) 25 MG capsule, Take 25 mg by mouth at bedtime as needed., Disp: , Rfl:  ?  EPINEPHrine 0.3 mg/0.3 mL IJ SOAJ injection, epinephrine 0.3 mg/0.3 mL injection, auto-injector (Patient not taking: Reported on 08/12/2021), Disp: , Rfl:  ?  ezetimibe (ZETIA) 10 MG tablet, Take 10 mg by mouth daily. (Patient not taking: Reported on 08/12/2021), Disp: , Rfl:  ?  famciclovir (FAMVIR) 500 MG tablet, Take 15,000 mg by mouth daily as needed (fever blister). , Disp: , Rfl: 11 ?  fluticasone (FLONASE) 50 MCG/ACT nasal spray, Place 1 spray into both nostrils daily., Disp: 16 g, Rfl: 0 ?  Folic Acid-Vit B6-Vit B12 (FABB) 2.2-25-1 MG TABS, Take 1 tablet by mouth daily., Disp: 180 tablet, Rfl: 3 ?  gabapentin (NEURONTIN) 300 MG capsule, TAKE ONE CAPSULE BY MOUTH THREE TIMES A DAY AS NEEDED FOR PAIN, Disp: 90 capsule, Rfl: 2 ?  hydrocortisone 2.5 % cream, SMARTSIG:1 Topical Every Night, Disp: , Rfl:  ?  ketoconazole (NIZORAL) 2 % cream, , Disp: , Rfl:  ?  ketoconazole (NIZORAL) 2 % shampoo, , Disp: ,  Rfl:  ?  levothyroxine (SYNTHROID) 25 MCG tablet, levothyroxine 25 mcg tablet  TAKE 1 TABLET IN THE MORNING ON AN EMPTY STOMACH 30 MINUTES BEFORE FOOD, Disp: , Rfl:  ?  lidocaine (LIDODERM) 5 %, APPLY 1 PATCH TO AFFECTED AREA FOR 12 HOURS IN A 24 HOUR PERIOD, Disp: 30 patch, Rfl: 2 ?  LORazepam (ATIVAN) 1 MG tablet, Take 1 tablet (1 mg total) by mouth 3 (three) times daily as needed for anxiety., Disp: 15 tablet, Rfl: 0 ?  Methotrexate, PF, (RASUVO) 25 MG/0.5ML SOAJ, INJECT ONE PEN SUBCUTANEOUSLY ONCE EVERY WEEK. STORE AT ROOM TEMPERATURE BETWEEN 68 - 77 DEGREES F., Disp: 12 mL, Rfl: 0 ?  nitroGLYCERIN (NITROSTAT) 0.4 MG SL  tablet, Place 1 tablet (0.4 mg total) under the tongue every 5 (five) minutes as needed for chest pain. (Patient not taking: Reported on 08/12/2021), Disp: 25 tablet, Rfl: 3 ?  NYAMYC powder, Apply topically., Disp: , Rfl:  ?  Oxymetazoline HCl (NASAL SPRAY) 0.05 % SOLN, Nasal Decongestant (oxymetazoline) 0.05 % spray, Disp: , Rfl:  ?  promethazine-dextromethorphan (PROMETHAZINE-DM) 6.25-15 MG/5ML syrup, Take 2.5 mLs by mouth 3 (three) times daily as needed for cough. (Patient not taking: Reported on 08/12/2021), Disp: 118 mL, Rfl: 0 ?  propranolol (INDERAL) 10 MG tablet, Take 10 mg by mouth daily., Disp: , Rfl:  ?  rizatriptan (MAXALT) 10 MG tablet, Take 1 tablet (10 mg total) by mouth as needed for migraine. May repeat in 2 hours if needed, Disp: 10 tablet, Rfl: 0 ?  rosuvastatin (CRESTOR) 20 MG tablet, Take 20 mg by mouth., Disp: , Rfl:  ?  Vitamin D, Ergocalciferol, (DRISDOL) 1.25 MG (50000 UT) CAPS capsule, Take 50,000 Units by mouth See admin instructions. Take 1 tablet (50000 units) by mouth every Monday, Wednesday, Saturday, Disp: , Rfl:   ? ?Allergies  ?Allergen Reactions  ? Influenza Vaccines Anaphylaxis  ? Amitriptyline   ? Isosorbide   ?  Severe headaches  ? ? ?Past Medical History:  ?Diagnosis Date  ? Anxiety   ? Bursitis   ? Coronary artery calcification seen on CAT scan   ? minimal CAD with 30% prox and mild LAD  ? Depression   ? Diastolic dysfunction   ? Dyslipidemia 01/30/2015  ? Esophageal ring   ? Fibromyalgia   ? Hiatal hernia   ? HSV-1 infection   ? Morbid obesity (HCC)   ? Osteoarthritis   ? Rheumatoid arthritis(714.0)   ? M05.79  ? Rheumatoid arthritis, seropositive, multiple sites [facility])   ? Treated with Orencia, TB neg 09/12/2015  ? Spondylolysis   ?  ? ?Past Surgical History:  ?Procedure Laterality Date  ? CARDIAC CATHETERIZATION N/A 06/11/2016  ? Procedure: Left Heart Cath and Coronary Angiography;  Surgeon: Corky Crafts, MD;  Location: [facility] INVASIVE CV LAB;  Service: Cardiovascular;   Laterality: N/A;  ? CESAREAN SECTION    ? COLONOSCOPY  07/27/2017  ? ESOPHAGEAL MANOMETRY N/A 10/28/2014  ? Procedure: ESOPHAGEAL MANOMETRY (EM);  Surgeon: Hilarie Fredrickson, MD;  Location: [facility] ENDOSCOPY;  Service: Endoscopy;  Laterality: N/A;  ? HERNIA REPAIR    ? reports surgery on 3 hernias, with 2 more present  ? LAPAROSCOPIC GASTRIC SLEEVE RESECTION  11/21/12  ? [facility]  ? LEFT HEART CATHETERIZATION WITH CORONARY ANGIOGRAM N/A 08/26/2014  ? Procedure: LEFT HEART CATHETERIZATION WITH CORONARY ANGIOGRAM;  Surgeon: Runell Gess, MD;  Location: [facility] CATH LAB;  Service: Cardiovascular;  Laterality: N/A;  ? TUBAL LIGATION    ? ? ?  Family History  ?Problem Relation Age of Onset  ? Hyperlipidemia Mother   ? Depression Mother   ? Sarcoidosis Father   ? Lung disease Father   ?     Pleural Mesothelioma  ? Cancer Father   ? Heart attack Paternal Grandmother   ? Hypertension Paternal Grandmother   ? Hypertension Maternal Grandmother   ? Diabetes Maternal Grandmother   ? Stroke Neg Hx   ? Colon cancer Neg Hx   ? Esophageal cancer Neg Hx   ? Rectal cancer Neg Hx   ? Stomach cancer Neg Hx   ? ? ?Social History  ? ?Tobacco Use  ? Smoking status: Never  ?  Passive exposure: Never  ? Smokeless tobacco: Never  ?Vaping Use  ? Vaping Use: Never used  ?Substance Use Topics  ? Alcohol use: No  ? Drug use: No  ? ? ?ROS ? ? ?Objective:  ? ?Vitals: ?BP 125/84 (BP Location: Left Arm)   Pulse 81   Temp 97.7 ?F (36.5 ?C) (Oral)   Resp 18   SpO2 98%  ? ?Physical Exam ?Constitutional:   ?   General: She is not in acute distress. ?   Appearance: Normal appearance. She is well-developed and normal weight. She is not ill-appearing, toxic-appearing or diaphoretic.  ?HENT:  ?   Head: Normocephalic and atraumatic.  ?   Right Ear: Tympanic membrane, ear canal and external ear normal. No drainage or tenderness. No middle ear effusion. There is no impacted cerumen. Tympanic membrane is not erythematous.  ?   Left Ear: Tympanic membrane, ear canal and  external ear normal. No drainage or tenderness.  No middle ear effusion. There is no impacted cerumen. Tympanic membrane is not erythematous.  ?   Nose: Congestion present. No rhinorrhea.  ?   Mouth/Throat:  ?   M

## 2021-10-08 NOTE — ED Triage Notes (Signed)
Pt c/o sore throat, cough, nasal congestion, headache,  ? ?Onset last night.  ?

## 2021-10-09 LAB — NOVEL CORONAVIRUS, NAA: SARS-CoV-2, NAA: NOT DETECTED

## 2021-10-10 LAB — CULTURE, GROUP A STREP (THRC)

## 2021-10-12 ENCOUNTER — Other Ambulatory Visit: Payer: Self-pay | Admitting: Physician Assistant

## 2021-10-12 NOTE — Telephone Encounter (Signed)
Next Visit: Due June 2023. Message sent to the front to schedule patient    Last Visit: 08/12/2021   Last Fill: 07/16/2021   Dx: Primary osteoarthritis of both knees    Current Dose per office note on 08/12/2021: not discussed   Okay to refill Voltaren Gel?   

## 2021-10-13 ENCOUNTER — Ambulatory Visit (INDEPENDENT_AMBULATORY_CARE_PROVIDER_SITE_OTHER): Payer: BC Managed Care – PPO | Admitting: Cardiology

## 2021-10-13 ENCOUNTER — Encounter: Payer: Self-pay | Admitting: Cardiology

## 2021-10-13 VITALS — BP 118/72 | HR 75 | Ht 62.0 in | Wt 226.0 lb

## 2021-10-13 DIAGNOSIS — I517 Cardiomegaly: Secondary | ICD-10-CM | POA: Diagnosis not present

## 2021-10-13 DIAGNOSIS — E7800 Pure hypercholesterolemia, unspecified: Secondary | ICD-10-CM | POA: Diagnosis not present

## 2021-10-13 DIAGNOSIS — I10 Essential (primary) hypertension: Secondary | ICD-10-CM | POA: Diagnosis not present

## 2021-10-13 DIAGNOSIS — I2510 Atherosclerotic heart disease of native coronary artery without angina pectoris: Secondary | ICD-10-CM | POA: Diagnosis not present

## 2021-10-13 DIAGNOSIS — G4719 Other hypersomnia: Secondary | ICD-10-CM

## 2021-10-13 DIAGNOSIS — R072 Precordial pain: Secondary | ICD-10-CM

## 2021-10-13 MED ORDER — ASPIRIN EC 81 MG PO TBEC: 81.0000 mg | DELAYED_RELEASE_TABLET | Freq: Every day | ORAL | 3 refills | Status: AC

## 2021-10-13 MED ORDER — METOPROLOL TARTRATE 100 MG PO TABS: 100.0000 mg | ORAL_TABLET | Freq: Once | ORAL | 0 refills | Status: DC

## 2021-10-13 NOTE — Patient Instructions (Addendum)
Medication Instructions:  ?Your physician has recommended you make the following change in your medication: ?1) START taking Aspirin 81 mg daily ?*If you need a refill on your cardiac medications before your next appointment, please call your pharmacy* ? ? ?Lab Work: ?Come back fasting for lipid panel, ALT, and BMET ? ?If you have labs (blood work) drawn today and your tests are completely normal, you will receive your results only by: ?MyChart Message (if you have MyChart) OR ?A paper copy in the mail ?If you have any lab test that is abnormal or we need to change your treatment, we will call you to review the results. ? ? ?Testing/Procedures: ?Your physician has requested that you have an echocardiogram. Echocardiography is a painless test that uses sound waves to create images of your heart. It provides your doctor with information about the size and shape of your heart and how well your heart?s chambers and valves are working. This procedure takes approximately one hour. There are no restrictions for this procedure. ? ?Your physician has requested that you have a coronary CTA scan. Please see next page for further instructions.  ? ?Follow-Up: ?At [facility], you and your health needs are our priority.  As part of our continuing mission to provide you with exceptional heart care, we have created designated Provider Care Teams.  These Care Teams include your primary Cardiologist (physician) and Advanced Practice Providers (APPs -  Physician Assistants and Nurse Practitioners) who all work together to provide you with the care you need, when you need it. ? ?Your next appointment:   ?1 year(s) ? ?The format for your next appointment:   ?In Person ? ?Provider:   ?Armanda Magic, MD{ ? ?Other Instructions ? ? ?Your cardiac CT will be scheduled at:  ? ?[facility] ?[address] ?[place], [place] [address] ?[phone] ? ? ?If scheduled at [facility], please arrive at the [facility] and  Children's Entrance (Entrance C2) of [facility] 30 minutes prior to test start time. ?You can use the FREE valet parking offered at entrance C (encouraged to control the heart rate for the test)  ?Proceed to the [facility] Radiology Department (first floor) to check-in and test prep. ? ?All radiology patients and guests should use entrance C2 at [facility], accessed from [facility], even though the hospital's physical address listed is [address]. ? ? ? ? ? ?Please follow these instructions carefully (unless otherwise directed): ? ?On the Night Before the Test: ?Be sure to Drink plenty of water. ?Do not consume any caffeinated/decaffeinated beverages or chocolate 12 hours prior to your test. ?Do not take any antihistamines 12 hours prior to your test. ? ? ?On the Day of the Test: ?Drink plenty of water until 1 hour prior to the test. ?Do not eat any food 4 hours prior to the test. ?You may take your regular medications prior to the test.  ?Take metoprolol (Lopressor) two hours prior to test. ?HOLD Inderal the morning of the test. ?FEMALES- please wear underwire-free bra if available, avoid dresses & tight clothing ?     ?After the Test: ?Drink plenty of water. ?After receiving IV contrast, you may experience a mild flushed feeling. This is normal. ?On occasion, you may experience a mild rash up to 24 hours after the test. This is not dangerous. If this occurs, you can take Benadryl 25 mg and increase your fluid intake. ?If you experience trouble breathing, this can  be serious. If it is severe call 911 IMMEDIATELY. If it is mild, please call our office. ?If you take any of these medications: Glipizide/Metformin, Avandament, Glucavance, please do not take 48 hours after completing test unless otherwise instructed. ? ?We will call to schedule your test 2-4 weeks out understanding that some insurance companies will need an authorization prior to the service being performed.   ? ?For non-scheduling related questions, please contact the cardiac imaging nurse navigator should you have any questions/concerns: ?Rockwell AlexandriaSara Wallace, Cardiac Imaging Nurse Navigator ?Larey BrickMerle Prescott, Cardiac Imaging Nurse Navigator ?[facility] ?Direct Office Dial: [phone]  ? ?For scheduling needs, including cancellations and rescheduling, please call [place], [phone].  ?

## 2021-10-13 NOTE — Addendum Note (Signed)
Addended by: Antonieta Iba on: 10/13/2021 01:42 PM ? ? Modules accepted: Orders ? ?

## 2021-10-13 NOTE — Progress Notes (Signed)
? ?Cardiology CONSULT Note   ? ?Date:  10/13/2021  ? ?ID:  [name] [name], DOB 1966-02-17, MRN [id number] ? ?PCP:  Jonathon Resides, MD (Inactive)  ?Cardiologist:  Fransico Him, MD  ? ?Chief Complaint  ?Patient presents with  ? Coronary Artery Disease  ? Hypertension  ? Hyperlipidemia  ? ? ?History of Present Illness:  ?[name] [name] is a 56 y.o. female who is being seen today for the evaluation of chest pain at the request of No ref. provider found. ? ?This is a 56 year old female with a history of diastolic dysfunction, rheumatoid arthritis, depression, atypical chest pain with minimal CAD by coronary CTA showing coronary calcium score 95th percentile but only 30% proximal LAD stenosis.  She tells me that her son was killed 2 years ago and she has not been right since then.  She apparently had a very bad anxiety attack associated with chest pain, SOB, left arm pain and diaphoresis. EKG was done that was nonischemic.   Her Cxray showed NAD and mild cardiomegaly.  The wait in the ER was too long so she left AMA.  ? ?She tells me that she has been having intermittent chest pain since December occurring every 3-4 days lasting an hour and then resolve on their own.  The pain is associated with SOB and diaphoresis and occurs mainly at rest.  She also has noticed increased DOE and will break out in a sweat when she exerts herself.  She denies PND, orthopnea, LE edema, dizziness or syncope. She has been having a lot of palpitations recently as well.  She says that her husband tells her that she snores loudly in her sleep.  She has occasionally awakened gasping for breath.  She feels very tired during the day and has a constant HA.  ? ?Past Medical History:  ?Diagnosis Date  ? Anxiety   ? Bursitis   ? Coronary artery calcification seen on CAT scan   ? minimal CAD with 30% prox and mild LAD  ? Depression   ? Diastolic dysfunction   ? Dyslipidemia 01/30/2015  ? Esophageal ring   ? Fibromyalgia   ? Hiatal hernia   ?  HSV-1 infection   ? Morbid obesity ([place])   ? Osteoarthritis   ? Rheumatoid arthritis(714.0)   ? M05.79  ? Rheumatoid arthritis, seropositive, multiple sites [facility])   ? Treated with Orencia, TB neg 09/12/2015  ? Spondylolysis   ? ? ?Past Surgical History:  ?Procedure Laterality Date  ? CARDIAC CATHETERIZATION N/A 06/11/2016  ? Procedure: Left Heart Cath and Coronary Angiography;  Surgeon: Jettie Booze, MD;  Location: [place] CV LAB;  Service: Cardiovascular;  Laterality: N/A;  ? CESAREAN SECTION    ? COLONOSCOPY  07/27/2017  ? ESOPHAGEAL MANOMETRY N/A 10/28/2014  ? Procedure: ESOPHAGEAL MANOMETRY (EM);  Surgeon: Irene Shipper, MD;  Location: [facility] ENDOSCOPY;  Service: Endoscopy;  Laterality: N/A;  ? HERNIA REPAIR    ? reports surgery on 3 hernias, with 2 more present  ? LAPAROSCOPIC GASTRIC SLEEVE RESECTION  11/21/12  ? [place]  ? LEFT HEART CATHETERIZATION WITH CORONARY ANGIOGRAM N/A 08/26/2014  ? Procedure: LEFT HEART CATHETERIZATION WITH CORONARY ANGIOGRAM;  Surgeon: Lorretta Harp, MD;  [facility] CATH LAB;  Service: Cardiovascular;  Laterality: N/A;  ? TUBAL LIGATION    ? ? ?Current Medications: ?Current Meds  ?Medication Sig  ? Abatacept (ORENCIA IV) Inject 1,000 mg into the vein every 28 (twenty-eight) days.  ? acetaminophen-codeine (TYLENOL #3)  300-30 MG tablet Take 1 tablet by mouth every 6 (six) hours as needed for moderate pain.  ? Adapalene 0.3 % gel Apply 1 application topically daily as needed (acne).   ? Alcohol Swabs (ALCOHOL WIPES) 70 % PADS Alcohol Prep Pads  ? ARIPiprazole (ABILIFY) 2 MG tablet Take 2 mg by mouth daily.  ? atorvastatin (LIPITOR) 80 MG tablet Add'l Sig Add'l Sig oral Add'l Sig  ? azelastine (ASTELIN) 0.1 % nasal spray Place 2 sprays into both nostrils 2 (two) times daily. (Patient taking differently: Place 2 sprays into both nostrils as needed.)  ? benzonatate (TESSALON) 100 MG capsule Take 1 capsule (100 mg total) by mouth 2 (two) times daily as needed for cough.  ?  buPROPion (WELLBUTRIN XL) 150 MG 24 hr tablet Take 450 mg by mouth every morning.  ? cetirizine (ZYRTEC ALLERGY) 10 MG tablet Take 1 tablet (10 mg total) by mouth daily.  ? cetirizine-pseudoephedrine (ZYRTEC-D) 5-120 MG tablet Take 1 tablet by mouth 2 (two) times daily.  ? cyclobenzaprine (FLEXERIL) 10 MG tablet TAKE ONE TABLET BY MOUTH EVERY NIGHT AT BEDTIME AS NEEDED FOR MUSCLE SPASMS  ? diclofenac Sodium (VOLTAREN) 1 % GEL APPLY 2-4 GRAMS TO AFFECTED JOINT 4 TIMES DAILY AS NEEDED.  ? diltiazem (CARDIZEM CD) 180 MG 24 hr capsule Take 1 capsule (180 mg total) by mouth daily.  ? doxepin (SINEQUAN) 25 MG capsule Take 25 mg by mouth at bedtime as needed.  ? EPINEPHrine 0.3 mg/0.3 mL IJ SOAJ injection   ? estradiol (ESTRACE) 0.5 MG tablet Take 0.5 mg by mouth daily.  ? ezetimibe (ZETIA) 10 MG tablet Take 10 mg by mouth daily.  ? famciclovir (FAMVIR) 500 MG tablet Take 15,000 mg by mouth daily as needed (fever blister).   ? famotidine (PEPCID) 20 MG tablet Take 20 mg by mouth as needed.  ? fluticasone (FLONASE) 50 MCG/ACT nasal spray Place 1 spray into both nostrils daily.  ? Folic Acid-Vit [id number] [id number] (FABB) 2.2-25-1 MG TABS Take 1 tablet by mouth daily.  ? gabapentin (NEURONTIN) 300 MG capsule TAKE ONE CAPSULE BY MOUTH THREE TIMES A DAY AS NEEDED FOR PAIN  ? hydrocortisone 2.5 % cream SMARTSIG:1 Topical Every Night  ? hydrOXYzine (VISTARIL) 25 MG capsule Take 25 mg by mouth as needed.  ? ketoconazole (NIZORAL) 2 % cream   ? ketoconazole (NIZORAL) 2 % shampoo   ? lidocaine (LIDODERM) 5 % APPLY 1 PATCH TO AFFECTED AREA FOR 12 HOURS IN A 24 HOUR PERIOD  ? LORazepam (ATIVAN) 1 MG tablet Take 1 tablet (1 mg total) by mouth 3 (three) times daily as needed for anxiety.  ? Methotrexate, PF, (RASUVO) 25 MG/0.5ML SOAJ INJECT ONE PEN SUBCUTANEOUSLY ONCE EVERY WEEK. STORE AT ROOM TEMPERATURE BETWEEN 68 - 77 DEGREES F.  ? nitroGLYCERIN (NITROSTAT) 0.4 MG SL tablet Place 1 tablet (0.4 mg total) under the tongue every 5 (five)  minutes as needed for chest pain.  ? NYAMYC powder Apply topically.  ? Oxymetazoline HCl (NASAL SPRAY) 0.05 % SOLN Nasal Decongestant (oxymetazoline) 0.05 % spray  ? predniSONE (DELTASONE) 20 MG tablet Take 2 tablets daily with breakfast.  ? progesterone (PROMETRIUM) 100 MG capsule Take 100 mg by mouth daily.  ? promethazine-dextromethorphan (PROMETHAZINE-DM) 6.25-15 MG/5ML syrup Take 2.5 mLs by mouth 4 (four) times daily as needed for cough.  ? propranolol (INDERAL) 10 MG tablet Take 10 mg by mouth daily.  ? pseudoephedrine (SUDAFED) 60 MG tablet Take 1 tablet (60 mg total) by mouth every 8 (  eight) hours as needed for congestion.  ? rizatriptan (MAXALT) 10 MG tablet Take 1 tablet (10 mg total) by mouth as needed for migraine. May repeat in 2 hours if needed  ? valACYclovir (VALTREX) 500 MG tablet Take 500 mg by mouth as needed.  ? Vitamin D, Ergocalciferol, (DRISDOL) 1.25 MG (50000 UT) CAPS capsule Take 50,000 Units by mouth See admin instructions. Take 1 tablet (50000 units) by mouth every Monday, Wednesday, Saturday  ? ? ?Allergies:   Influenza vaccines, Amitriptyline, and Isosorbide  ? ?Social History  ? ?Socioeconomic History  ? Marital status: Married  ?  Spouse name: Harrell Gave   ? Number of children: 2  ? Years of education: 68  ? Highest education level: Not on file  ?Occupational History  ? Occupation: CUSTOMER SERVICE  ?  Employer: [employer]  ?Tobacco Use  ? Smoking status: Never  ?  Passive exposure: Never  ? Smokeless tobacco: Never  ?Vaping Use  ? Vaping Use: Never used  ?Substance and Sexual Activity  ? Alcohol use: No  ? Drug use: No  ? Sexual activity: Yes  ?  Partners: Male  ?  Birth control/protection: None  ?Other Topics Concern  ? Not on file  ?Social History Narrative  ? Marital Status: Married Aeronautical engineer)   ? Children:  Son Marcello Moores) Daughter Margreta Journey)   ? Pets: None   ? Living Situation: Lives with husband and children   ? Occupation: Architect)    ?  Education: Bachelor's Degree (Psychology)    ? Tobacco Use/Exposure:  None   ? Alcohol Use:  Occasional  ? Drug Use:  None  ? Diet:  Regular  ? Exercise: Walking or Treadmill (2 x week)  ? Hobbies: Teacher, music

## 2021-10-13 NOTE — Addendum Note (Signed)
Addended by: Theresia MajorsVERBEY, Arias Weinert on: 10/13/2021 01:49 PM ? ? Modules accepted: Orders ? ?

## 2021-10-14 ENCOUNTER — Telehealth: Payer: Self-pay | Admitting: Pharmacist

## 2021-10-14 NOTE — Telephone Encounter (Signed)
Received fax from [facility] for Rasuvo prior authorization renewal. Completed PA form and faxed with clinicals ? ?Case ID: [id number] ?Fax: [phone] ?Phone: [phone] ? ?Chesley Miresevki Maxwell Martorano, PharmD, MPH, BCPS, CPP ?Clinical Pharmacist (Rheumatology and Pulmonology) ? ?

## 2021-10-16 ENCOUNTER — Other Ambulatory Visit: Payer: BC Managed Care – PPO | Admitting: *Deleted

## 2021-10-16 DIAGNOSIS — E7800 Pure hypercholesterolemia, unspecified: Secondary | ICD-10-CM

## 2021-10-16 DIAGNOSIS — I517 Cardiomegaly: Secondary | ICD-10-CM

## 2021-10-16 DIAGNOSIS — I10 Essential (primary) hypertension: Secondary | ICD-10-CM

## 2021-10-16 DIAGNOSIS — R072 Precordial pain: Secondary | ICD-10-CM

## 2021-10-16 DIAGNOSIS — G4719 Other hypersomnia: Secondary | ICD-10-CM

## 2021-10-16 DIAGNOSIS — I2510 Atherosclerotic heart disease of native coronary artery without angina pectoris: Secondary | ICD-10-CM

## 2021-10-16 LAB — BASIC METABOLIC PANEL
BUN/Creatinine Ratio: 15 (ref 9–23)
BUN: 12 mg/dL (ref 6–24)
CO2: 24 mmol/L (ref 20–29)
Calcium: 8.8 mg/dL (ref 8.7–10.2)
Chloride: 106 mmol/L (ref 96–106)
Creatinine, Ser: 0.8 mg/dL (ref 0.57–1.00)
Glucose: 82 mg/dL (ref 70–99)
Potassium: 4 mmol/L (ref 3.5–5.2)
Sodium: 142 mmol/L (ref 134–144)
eGFR: 88 mL/min/{1.73_m2} (ref >59)

## 2021-10-16 LAB — LIPID PANEL
Chol/HDL Ratio: 4.3 ratio (ref 0.0–4.4)
Cholesterol, Total: 221 mg/dL — ABNORMAL HIGH (ref 100–199)
HDL: 52 mg/dL (ref >39)
LDL Chol Calc (NIH): 155 mg/dL — ABNORMAL HIGH (ref 0–99)
Triglycerides: 81 mg/dL (ref 0–149)
VLDL Cholesterol Cal: 14 mg/dL (ref 5–40)

## 2021-10-16 LAB — ALT: ALT: 14 IU/L (ref 0–32)

## 2021-10-19 NOTE — Telephone Encounter (Signed)
Received notification from [facility] regarding a prior authorization for RASUVO. Authorization has been APPROVED from 10/19/21 to 10/20/22.  ? ?Patient must fill through [facility]: [phone] ? ?Authorization # [id number] ? ?Chesley Mires, PharmD, MPH, BCPS, CPP ?Clinical Pharmacist (Rheumatology and Pulmonology) ?

## 2021-10-20 ENCOUNTER — Telehealth: Payer: Self-pay

## 2021-10-20 ENCOUNTER — Other Ambulatory Visit: Payer: BC Managed Care – PPO

## 2021-10-20 DIAGNOSIS — E7800 Pure hypercholesterolemia, unspecified: Secondary | ICD-10-CM

## 2021-10-20 MED ORDER — ROSUVASTATIN CALCIUM 40 MG PO TABS: 40.0000 mg | ORAL_TABLET | Freq: Every day | ORAL | 3 refills | Status: DC

## 2021-10-20 NOTE — Telephone Encounter (Signed)
-----   Message from Quintella Reichert, MD sent at 10/17/2021 11:42 AM EDT ----- ?LDL not at goal.  Increase Crestor to 40mg  daily and repeat FLP and ALT in 6 weeks ?

## 2021-10-20 NOTE — Telephone Encounter (Signed)
The patient has been notified of the result and verbalized understanding.  All questions (if any) were answered. ?[name] MajorsCarlyle  Kalsey Lull, RN 10/20/2021 11:57 AM  ?Crestor 40 mg daily has been sent in. Repeat labs have been scheduled.  ?

## 2021-10-21 ENCOUNTER — Other Ambulatory Visit: Payer: Self-pay | Admitting: *Deleted

## 2021-10-21 MED ORDER — GABAPENTIN 300 MG PO CAPS: ORAL_CAPSULE | ORAL | 2 refills | Status: DC

## 2021-10-21 NOTE — Telephone Encounter (Signed)
Next Visit: Due June 2023. Message sent to the front to schedule patient  ?  ?Last Visit: 08/12/2021 ?  ?Last Fill: 06/22/2021 ? ?Dx: Fibromyalgia ? ?Current Dose per office note on 08/12/2021: gabapentin 300 mg 1 capsule 3 times daily  ? ?Okay to refill Gabapentin?  ?

## 2021-10-27 ENCOUNTER — Ambulatory Visit (HOSPITAL_COMMUNITY): Payer: BC Managed Care – PPO | Attending: Cardiology

## 2021-10-27 ENCOUNTER — Encounter: Payer: Self-pay | Admitting: Cardiology

## 2021-10-27 DIAGNOSIS — G4719 Other hypersomnia: Secondary | ICD-10-CM | POA: Insufficient documentation

## 2021-10-27 DIAGNOSIS — I517 Cardiomegaly: Secondary | ICD-10-CM | POA: Diagnosis not present

## 2021-10-27 DIAGNOSIS — E7800 Pure hypercholesterolemia, unspecified: Secondary | ICD-10-CM | POA: Insufficient documentation

## 2021-10-27 DIAGNOSIS — I10 Essential (primary) hypertension: Secondary | ICD-10-CM | POA: Insufficient documentation

## 2021-10-27 DIAGNOSIS — R072 Precordial pain: Secondary | ICD-10-CM | POA: Diagnosis present

## 2021-10-27 DIAGNOSIS — I2510 Atherosclerotic heart disease of native coronary artery without angina pectoris: Secondary | ICD-10-CM | POA: Diagnosis present

## 2021-10-27 LAB — ECHOCARDIOGRAM COMPLETE
Area-P 1/2: 4.21 cm2
S' Lateral: 2.7 cm

## 2021-10-28 ENCOUNTER — Telehealth: Payer: Self-pay | Admitting: *Deleted

## 2021-10-28 NOTE — Telephone Encounter (Signed)
Called and made the patient aware that SHE [facility]. PIN # [phone] provided to the patient. Patient made aware that SHE will be contacted after the test has been read with the results and any recommendations. Patient verbalized understanding and thanked me for the call.   Pt states she will do sleep study this week.

## 2021-10-28 NOTE — Telephone Encounter (Signed)
TURNER READ-NO PA REQ REF#  [id number]

## 2021-10-30 ENCOUNTER — Telehealth (HOSPITAL_COMMUNITY): Payer: Self-pay | Admitting: Emergency Medicine

## 2021-10-30 NOTE — Telephone Encounter (Signed)
Reaching out to patient to offer assistance regarding upcoming cardiac imaging study; pt verbalizes understanding of appt date/time, parking situation and where to check in, pre-test NPO status and medications ordered, and verified current allergies; name and call back number provided for further questions should they arise Shamela Haydon RN Navigator Cardiac Imaging [facility] [phone] office [phone] cell  100mg metoprolol tartrate Denies iv issues Arrival 1030  

## 2021-11-03 ENCOUNTER — Encounter (HOSPITAL_COMMUNITY): Payer: Self-pay

## 2021-11-03 ENCOUNTER — Ambulatory Visit (HOSPITAL_COMMUNITY)
Admission: RE | Admit: 2021-11-03 | Discharge: 2021-11-03 | Disposition: A | Discharge status: Home / Self Care | Payer: BC Managed Care – PPO | Source: Ambulatory Visit | Attending: Cardiology | Admitting: Cardiology

## 2021-11-03 DIAGNOSIS — R072 Precordial pain: Secondary | ICD-10-CM | POA: Insufficient documentation

## 2021-11-03 MED ORDER — NITROGLYCERIN 0.4 MG SL SUBL
SUBLINGUAL_TABLET | SUBLINGUAL | Status: AC
  Filled 2021-11-03: qty 2

## 2021-11-03 MED ORDER — NITROGLYCERIN 0.4 MG SL SUBL
0.8000 mg | SUBLINGUAL_TABLET | Freq: Once | SUBLINGUAL | Status: AC
  Administered 2021-11-03: 0.8 mg via SUBLINGUAL

## 2021-11-03 MED ORDER — IOHEXOL 350 MG/ML SOLN
100.0000 mL | Freq: Once | INTRAVENOUS | Status: AC | PRN
  Administered 2021-11-03: 100 mL via INTRAVENOUS

## 2021-11-04 ENCOUNTER — Encounter (HOSPITAL_COMMUNITY)
Admission: RE | Admit: 2021-11-04 | Discharge: 2021-11-04 | Disposition: A | Discharge status: Home / Self Care | Payer: BC Managed Care – PPO | Source: Ambulatory Visit | Attending: Rheumatology | Admitting: Rheumatology

## 2021-11-04 ENCOUNTER — Encounter: Payer: Self-pay | Admitting: Cardiology

## 2021-11-04 VITALS — BP 121/83 | HR 54 | Temp 97.5°F | Resp 18 | Wt 236.0 lb

## 2021-11-04 DIAGNOSIS — M069 Rheumatoid arthritis, unspecified: Secondary | ICD-10-CM | POA: Diagnosis present

## 2021-11-04 DIAGNOSIS — M0579 Rheumatoid arthritis with rheumatoid factor of multiple sites without organ or systems involvement: Secondary | ICD-10-CM | POA: Insufficient documentation

## 2021-11-04 DIAGNOSIS — Z79899 Other long term (current) drug therapy: Secondary | ICD-10-CM | POA: Diagnosis present

## 2021-11-04 LAB — CBC WITH DIFFERENTIAL/PLATELET
Abs Immature Granulocytes: 0.03 10*3/uL (ref 0.00–0.07)
Basophils Absolute: 0.1 10*3/uL (ref 0.0–0.1)
Basophils Relative: 1 %
Eosinophils Absolute: 0.2 10*3/uL (ref 0.0–0.5)
Eosinophils Relative: 2 %
HCT: 43.6 % (ref 36.0–46.0)
Hemoglobin: 13.4 g/dL (ref 12.0–15.0)
Immature Granulocytes: 0 %
Lymphocytes Relative: 45 %
Lymphs Abs: 3.9 10*3/uL (ref 0.7–4.0)
MCH: 26.5 pg (ref 26.0–34.0)
MCHC: 30.7 g/dL (ref 30.0–36.0)
MCV: 86.2 fL (ref 80.0–100.0)
Monocytes Absolute: 0.5 10*3/uL (ref 0.1–1.0)
Monocytes Relative: 6 %
Neutro Abs: 3.9 10*3/uL (ref 1.7–7.7)
Neutrophils Relative %: 46 %
Platelets: 267 10*3/uL (ref 150–400)
RBC: 5.06 MIL/uL (ref 3.87–5.11)
RDW: 12.9 % (ref 11.5–15.5)
WBC: 8.5 10*3/uL (ref 4.0–10.5)
nRBC: 0 % (ref 0.0–0.2)

## 2021-11-04 LAB — COMPREHENSIVE METABOLIC PANEL
ALT: 21 U/L (ref 0–44)
AST: 22 U/L (ref 15–41)
Albumin: 3.2 g/dL — ABNORMAL LOW (ref 3.5–5.0)
Alkaline Phosphatase: 69 U/L (ref 38–126)
Anion gap: 8 (ref 5–15)
BUN: 7 mg/dL (ref 6–20)
CO2: 27 mmol/L (ref 22–32)
Calcium: 8.9 mg/dL (ref 8.9–10.3)
Chloride: 107 mmol/L (ref 98–111)
Creatinine, Ser: 0.87 mg/dL (ref 0.44–1.00)
GFR, Estimated: >60 mL/min (ref >60)
Glucose, Bld: 97 mg/dL (ref 70–99)
Potassium: 4.1 mmol/L (ref 3.5–5.1)
Sodium: 142 mmol/L (ref 135–145)
Total Bilirubin: 0.5 mg/dL (ref 0.3–1.2)
Total Protein: 6.5 g/dL (ref 6.5–8.1)

## 2021-11-04 MED ORDER — SODIUM CHLORIDE 0.9 % IV SOLN
1000.0000 mg | INTRAVENOUS | Status: DC
  Administered 2021-11-04: 1000 mg via INTRAVENOUS
  Filled 2021-11-04: qty 40

## 2021-11-04 MED ORDER — DIPHENHYDRAMINE HCL 25 MG PO CAPS: 25.0000 mg | ORAL_CAPSULE | ORAL | Status: DC

## 2021-11-04 MED ORDER — ACETAMINOPHEN 325 MG PO TABS
650.0000 mg | ORAL_TABLET | ORAL | Status: DC
  Administered 2021-11-04: 650 mg via ORAL

## 2021-11-04 MED ORDER — ACETAMINOPHEN 325 MG PO TABS
ORAL_TABLET | ORAL | Status: AC
  Filled 2021-11-04: qty 2

## 2021-11-04 MED ORDER — DIPHENHYDRAMINE HCL 25 MG PO CAPS
ORAL_CAPSULE | ORAL | Status: AC
  Administered 2021-11-04: 25 mg via ORAL
  Filled 2021-11-04: qty 1

## 2021-11-11 ENCOUNTER — Ambulatory Visit
Admission: EM | Admit: 2021-11-11 | Discharge: 2021-11-11 | Disposition: A | Discharge status: Home / Self Care | Payer: BC Managed Care – PPO | Attending: Internal Medicine | Admitting: Internal Medicine

## 2021-11-11 ENCOUNTER — Telehealth: Payer: BC Managed Care – PPO | Admitting: Family Medicine

## 2021-11-11 DIAGNOSIS — J069 Acute upper respiratory infection, unspecified: Secondary | ICD-10-CM | POA: Insufficient documentation

## 2021-11-11 DIAGNOSIS — R002 Palpitations: Secondary | ICD-10-CM | POA: Diagnosis not present

## 2021-11-11 DIAGNOSIS — B9689 Other specified bacterial agents as the cause of diseases classified elsewhere: Secondary | ICD-10-CM

## 2021-11-11 DIAGNOSIS — K1379 Other lesions of oral mucosa: Secondary | ICD-10-CM | POA: Diagnosis not present

## 2021-11-11 DIAGNOSIS — J029 Acute pharyngitis, unspecified: Secondary | ICD-10-CM | POA: Insufficient documentation

## 2021-11-11 LAB — POCT RAPID STREP A (OFFICE): Rapid Strep A Screen: NEGATIVE

## 2021-11-11 MED ORDER — TRIAMCINOLONE ACETONIDE 0.1 % MT PSTE: 1.0000 "application " | PASTE | Freq: Two times a day (BID) | OROMUCOSAL | 12 refills | Status: DC

## 2021-11-11 MED ORDER — FLUTICASONE PROPIONATE 50 MCG/ACT NA SUSP: 1.0000 | Freq: Every day | NASAL | 0 refills | Status: DC

## 2021-11-11 NOTE — Progress Notes (Signed)
[place]  Sinus symptoms with new onset mouth sores on roof of palate that are red and raised- video quality makes this are to see well so in person assessment is warranted. Pt on IV meds for RA and immune system is lowered.  Patient acknowledged agreement and understanding of the plan.

## 2021-11-11 NOTE — ED Triage Notes (Signed)
Pt c/o headache, palpitations, headache, sinus pressure.

## 2021-11-11 NOTE — Patient Instructions (Signed)
Please call your PCP and or go to the closest UC for treatment and to have your mouth exam.  I hope you feel better soon.

## 2021-11-11 NOTE — Discharge Instructions (Addendum)
It appears that you have a viral infection that is causing symptoms.  COVID test is pending.  Rapid strep was negative.  Throat culture is pending.  We will call if it is positive.  You have been prescribed 2 medications to alleviate symptoms.  A paste has been prescribed to apply directly to mouth sores.  Please follow-up if symptoms persist or worsen.  Please follow-up with cardiology and/or PCP for increased palpitations.

## 2021-11-11 NOTE — ED Provider Notes (Signed)
[facility]    CSN: [id number] Arrival date & time: 11/11/21  1341      History   Chief Complaint Chief Complaint  Patient presents with   Palpitations    HPI ELFRIEDE BONINI is a 56 y.o. female.   Patient presents with sinus pressure, nasal congestion, sore throat, mouth sores that started yesterday.  Denies cough, chest pain, shortness of breath, ear pain, nausea, vomiting, diarrhea, abdominal pain.  Patient has taken Tylenol for symptoms with minimal improvement.  Denies any known fevers or sick contacts.  She had video visit today but was advised to seek care in urgent care for in person evaluation.  Patient also reports that she has been having some increase in palpitations since the month of June started.  She reports that she has been having intermittent palpitations since December with no obvious changes except increase in frequency.  She reports that she thinks it may be related to anxiety as her late son's birthday is in June and she has been thinking about him a lot lately.  She states that cardiology is suspicious that her palpitations could be related to anxiety.  She is being followed by cardiology for these symptoms very closely and has follow-up appointment in July.   Palpitations  Past Medical History:  Diagnosis Date   Anxiety    Bursitis    CAD (coronary artery disease), native coronary artery    Coronary CTA showed minimal calcified plaque in the proximal and mid LAD less than 25%, mild left atrial enlargement and a coronary calcium score to 73 which is 99th percentile for age and sex matched controls by coronary CTA 10-2021   Depression    Diastolic dysfunction    Dyslipidemia 01/30/2015   Esophageal ring    Fibromyalgia    Hiatal hernia    HSV-1 infection    Morbid obesity (HCC)    Osteoarthritis    Rheumatoid arthritis(714.0)    M05.79   Rheumatoid arthritis, seropositive, 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HCC)    Treated with Orencia, TB neg 09/12/2015    Spondylolysis     Patient Active Problem List   Diagnosis Date Noted   Amenorrhea 06/09/2020   Anxiety 02/20/2018   Fatigue 02/20/2018   Low blood potassium 02/20/2018   Allergic rhinitis 12/14/2017   Fever blister 12/14/2017   Head congestion 12/14/2017   Hyperlipidemia 12/14/2017   Lateral epicondylitis of right elbow 05/04/2017   Sacral pain 03/23/2017   High risk medication use 08/03/2016   Fibromyalgia syndrome 08/03/2016   Abnormal cardiac CT angiography    Sacrococcygeal pain 02/20/2016   Rheumatoid arthritis involving 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HCC) 01/24/2016   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   Dyslipidemia 01/30/2015   MDD (major depressive disorder), recurrent severe, 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   Essential hypertension 08/25/2014   Morbid obesity (HCC) 08/25/2014   CAD (coronary artery disease), native coronary artery 08/24/2014   Numbness and tingling in left arm    Arm numbness left 08/22/2014   Chest pain 08/22/2014   Gastroesophageal reflux disease without esophagitis 07/23/2014   Coronary artery calcification seen on CAT scan 02/19/2014   Hip pain 10/23/2013   Solitary pulmonary nodule 08/28/2013   Chest pain, atypical 07/18/2013   Heart palpitations 06/27/2013   Cough 06/04/2013   Diastolic dysfunction 05/18/2013   SOB (shortness of breath) 05/09/2013   Other malaise and fatigue 01/21/2013   Stress and adjustment reaction 12/09/2012   Right sided abdominal pain 11/29/2012   History of laparoscopic partial gastrectomy  11/21/2012   Morbid obesity with BMI of 50.0-59.9, adult (HCC) 11/21/2012   Arthritis 07/13/2012   Depression 07/13/2012   Routine health maintenance 05/07/2012   Migraine 06/07/1998    Past Surgical History:  Procedure Laterality Date   CARDIAC CATHETERIZATION N/A 06/11/2016   Procedure: Left Heart Cath and Coronary Angiography;  Surgeon: Corky CraftsJayadeep S Varanasi, MD;  [facility];  Service: Cardiovascular;  Laterality:  N/A;   CESAREAN SECTION     COLONOSCOPY  07/27/2017   ESOPHAGEAL MANOMETRY N/A 10/28/2014   Procedure: ESOPHAGEAL MANOMETRY (EM);  Surgeon: Hilarie FredricksonJohn N Perry, MD;  Location: [facility];  Service: Endoscopy;  Laterality: N/A;   HERNIA REPAIR     reports surgery on 3 hernias, with 2 more present   [facility]   LEFT HEART CATHETERIZATION WITH CORONARY ANGIOGRAM N/A 08/26/2014   Procedure: LEFT HEART CATHETERIZATION WITH CORONARY ANGIOGRAM;  Surgeon: Runell GessJonathan J Berry, MD;  [facility];  Service: Cardiovascular;  Laterality: N/A;   TUBAL LIGATION      OB History   No obstetric history on file.      Home Medications    Prior to Admission medications   Medication Sig Start Date End Date Taking? Authorizing Provider  fluticasone (FLONASE) 50 MCG/ACT nasal spray Place 1 spray into both nostrils daily for 3 days. 11/11/21 11/14/21 Yes , Rolly SalterHaley E, FNP  triamcinolone (KENALOG) 0.1 % paste Use as directed 1 application. in the mouth or throat 2 (two) times daily. Apply to mouth sores 11/11/21  Yes , Dodge Center[place]aley E, [place]  Abatacept (ORENCIA IV) Inject 1,000 mg into the vein every 28 (twenty-eight) days.    Pollyann Savoyeveshwar, Shaili, MD  acetaminophen-codeine (TYLENOL #3) 300-30 MG tablet Take 1 tablet by mouth every 6 (six) hours as needed for moderate pain. 09/21/21   Kirtland Bouchardlark, Gilbert W, PA-C  Adapalene 0.3 % gel Apply 1 application topically daily as needed (acne).  12/31/14   [provider]  Alcohol Swabs (ALCOHOL WIPES) 70 % PADS Alcohol Prep Pads    [provider]  ARIPiprazole (ABILIFY) 2 MG tablet Take 2 mg by mouth daily. 09/25/21   [provider]  aspirin EC 81 MG tablet Take 1 tablet (81 mg total) by mouth daily. Swallow whole. 10/13/21   Quintella Reicherturner, Traci R, MD  azelastine (ASTELIN) 0.1 % nasal spray Place 2 sprays into both nostrils 2 (two) times daily. Patient taking differently: Place 2 sprays into both nostrils as needed.  01/12/20   Cathie HoopsYu, Amy V, PA-C  benzonatate (TESSALON) 100 MG capsule Take 1 capsule (100 mg total) by mouth 2 (two) times daily as needed for cough. 04/15/21   Freddy FinnerMills, Hannah M, NP  buPROPion (WELLBUTRIN XL) 150 MG 24 hr tablet Take 450 mg by mouth every morning. 05/23/19   [provider]  cetirizine (ZYRTEC ALLERGY) 10 MG tablet Take 1 tablet (10 mg total) by mouth daily. 10/08/21   Wallis BambergMani, Mario, PA-C  cetirizine-pseudoephedrine (ZYRTEC-D) 5-120 MG tablet Take 1 tablet by mouth 2 (two) times daily. 07/03/21   Daphine DeutscherMartin, Mary-Margaret, FNP  cyclobenzaprine (FLEXERIL) 10 MG tablet TAKE ONE TABLET BY MOUTH EVERY NIGHT AT BEDTIME AS NEEDED FOR MUSCLE SPASMS 03/20/21   Gearldine Bienenstockale, Taylor M, PA-C  diclofenac Sodium (VOLTAREN) 1 % GEL APPLY 2-4 GRAMS TO AFFECTED JOINT 4 TIMES DAILY AS NEEDED. 10/12/21   Gearldine Bienenstockale, Taylor M, PA-C  diltiazem (CARDIZEM CD) 180 MG 24 hr capsule Take 1 capsule (180 mg total)  by mouth daily. 01/23/21   Quintella Reichert, MD  doxepin (SINEQUAN) 25 MG capsule Take 25 mg by mouth at bedtime as needed. 04/28/19   [provider]  EPINEPHrine 0.3 mg/0.3 mL IJ SOAJ injection  07/02/19   [provider]  estradiol (ESTRACE) 0.5 MG tablet Take 0.5 mg by mouth daily. 09/16/21   [provider]  ezetimibe (ZETIA) 10 MG tablet Take 10 mg by mouth daily. 10/22/19   [provider]  famciclovir (FAMVIR) 500 MG tablet Take 15,000 mg by mouth daily as needed (fever blister).  01/21/15   [provider]  famotidine (PEPCID) 20 MG tablet Take 20 mg by mouth as needed. 09/22/21   [provider]  Folic Acid-Vit B6-Vit B12 (FABB) 2.2-25-1 MG TABS Take 1 tablet by mouth daily. 03/20/21   Gearldine Bienenstock, PA-C  gabapentin (NEURONTIN) 300 MG capsule TAKE ONE CAPSULE BY MOUTH THREE TIMES A DAY AS NEEDED FOR PAIN 10/21/21   Pollyann Savoy, MD  hydrocortisone 2.5 % cream SMARTSIG:1 Topical Every Night 11/06/19   [provider]  hydrOXYzine (VISTARIL) 25 MG capsule  Take 25 mg by mouth as needed. 10/04/21   [provider]  ketoconazole (NIZORAL) 2 % cream  03/14/20   [provider]  ketoconazole (NIZORAL) 2 % shampoo  06/03/20   [provider]  lidocaine (LIDODERM) 5 % APPLY 1 PATCH TO AFFECTED AREA FOR 12 HOURS IN A 24 HOUR PERIOD 03/20/21   Gearldine Bienenstock, PA-C  LORazepam (ATIVAN) 1 MG tablet Take 1 tablet (1 mg total) by mouth 3 (three) times daily as needed for anxiety. 10/09/19   Moshe Cipro, NP  Methotrexate, PF, (RASUVO) 25 MG/0.5ML SOAJ INJECT ONE PEN SUBCUTANEOUSLY ONCE EVERY WEEK. STORE AT ROOM TEMPERATURE BETWEEN 68 - 77 DEGREES F. 08/12/21   Pollyann Savoy, MD  metoprolol tartrate (LOPRESSOR) 100 MG tablet Take 1 tablet (100 mg total) by mouth once for 1 dose. Take 1 tablet (100 mg total) two hours prior to CT scan. 10/13/21 10/13/21  Quintella Reichert, MD  nitroGLYCERIN (NITROSTAT) 0.4 MG SL tablet Place 1 tablet (0.4 mg total) under the tongue every 5 (five) minutes as needed for chest pain. 05/01/19   Rosalio Macadamia, NP  [facility] powder Apply topically. 03/17/20   [provider]  Oxymetazoline HCl (NASAL SPRAY) 0.05 % SOLN Nasal Decongestant (oxymetazoline) 0.05 % spray    [provider]  predniSONE (DELTASONE) 20 MG tablet Take 2 tablets daily with breakfast. 10/08/21   Wallis Bamberg, PA-C  progesterone (PROMETRIUM) 100 MG capsule Take 100 mg by mouth daily. 09/16/21   [provider]  promethazine-dextromethorphan (PROMETHAZINE-DM) 6.25-15 MG/5ML syrup Take 2.5 mLs by mouth 4 (four) times daily as needed for cough. 10/08/21   Wallis Bamberg, PA-C  propranolol (INDERAL) 10 MG tablet Take 10 mg by mouth daily.    [provider]  pseudoephedrine (SUDAFED) 60 MG tablet Take 1 tablet (60 mg total) by mouth every 8 (eight) hours as needed for congestion. 10/08/21   Wallis Bamberg, PA-C  rizatriptan (MAXALT) 10 MG tablet Take 1 tablet (10 mg total) by mouth as needed for migraine. May repeat in 2  hours if needed 04/15/21   Freddy Finner, NP  rosuvastatin (CRESTOR) 40 MG tablet Take 1 tablet (40 mg total) by mouth daily. 10/20/21   Quintella Reichert, MD  valACYclovir (VALTREX) 500 MG tablet Take 500 mg by mouth as needed. 09/17/21   [provider]  Vitamin  D, Ergocalciferol, (DRISDOL) 1.25 MG (50000 UT) CAPS capsule Take 50,000 Units by mouth See admin instructions. Take 1 tablet (50000 units) by mouth every Monday, Wednesday, Saturday    [provider]    Family History Family History  Problem Relation Age of Onset   Hyperlipidemia Mother    Depression Mother    Sarcoidosis Father    Lung disease Father        Pleural Mesothelioma   Cancer Father    Heart attack Paternal Grandmother    Hypertension Paternal Grandmother    Hypertension Maternal Grandmother    Diabetes Maternal Grandmother    Stroke Neg Hx    Colon cancer Neg Hx    Esophageal cancer Neg Hx    Rectal cancer Neg Hx    Stomach cancer Neg Hx     Social History Social History   Tobacco Use   Smoking status: Never    Passive exposure: Never   Smokeless tobacco: Never  Vaping Use   Vaping Use: Never used  Substance Use Topics   Alcohol use: No   Drug use: No     Allergies   Influenza vaccines, Amitriptyline, and Isosorbide   Review of Systems Review of Systems Per HPI  Physical Exam Triage Vital Signs ED Triage Vitals [11/11/21 1351]  Enc Vitals Group     BP 119/77     Pulse Rate 78     Resp 18     Temp 98 F (36.7 C)     Temp Source Oral     SpO2 98 %     Weight      Height      Head Circumference      Peak Flow      Pain Score 0     Pain Loc      Pain Edu?      Excl. in GC?    No data found.  Updated Vital Signs BP 119/77 (BP Location: Left Arm)   Pulse 78   Temp 98 F (36.7 C) (Oral)   Resp 18   SpO2 98%   Visual Acuity Right Eye Distance:   Left Eye Distance:   Bilateral Distance:    Right Eye Near:   Left Eye Near:    Bilateral Near:      Physical Exam Constitutional:      General: She is not in acute distress.    Appearance: Normal appearance. She is not toxic-appearing or diaphoretic.  HENT:     Head: Normocephalic and atraumatic.     Right Ear: Tympanic membrane and ear canal normal.     Left Ear: Tympanic membrane and ear canal normal.     Nose: Congestion present.     Mouth/Throat:     Mouth: Mucous membranes are moist.     Pharynx: Posterior oropharyngeal erythema present. No pharyngeal swelling, oropharyngeal exudate or uvula swelling.     Tonsils: No tonsillar exudate or tonsillar abscesses.     Comments: Aphthous ulcers present to upper palate.  No obvious purulent drainage or abscess noted. Eyes:     Extraocular Movements: Extraocular movements intact.     Conjunctiva/sclera: Conjunctivae normal.     Pupils: Pupils are equal, round, and reactive to light.  Cardiovascular:     Rate and Rhythm: Normal rate and regular rhythm.     Pulses: Normal pulses.     Heart sounds: Normal heart sounds.  Pulmonary:     Effort: Pulmonary effort is normal. No respiratory distress.  Breath sounds: Normal breath sounds. No stridor. No wheezing, rhonchi or rales.  Abdominal:     General: Abdomen is flat. Bowel sounds are normal.     Palpations: Abdomen is soft.  Musculoskeletal:        General: Normal range of motion.     Cervical back: Normal range of motion.  Skin:    General: Skin is warm and dry.  Neurological:     General: No focal deficit present.     Mental Status: She is alert and oriented to person, place, and time. Mental status is at baseline.  Psychiatric:        Mood and Affect: Mood normal.        Behavior: Behavior normal.     UC Treatments / Results  Labs (all labs ordered are listed, but only abnormal results are displayed) Labs Reviewed  CULTURE, GROUP A STREP ([facility])  NOVEL CORONAVIRUS, NAA  POCT RAPID STREP A (OFFICE)    EKG   Radiology No results found.  Procedures Procedures  (including critical care time)  Medications Ordered in UC Medications - No data to display  Initial Impression / Assessment and Plan / UC Course  I have reviewed the triage vital signs and the nursing notes.  Pertinent labs & imaging results that were available during my care of the patient were reviewed by me and considered in my medical decision making (see chart for details).     Patient presents with symptoms likely from a viral upper respiratory infection. Differential includes bacterial pneumonia, sinusitis, allergic rhinitis, COVID-19, flu. Do not suspect underlying cardiopulmonary process. Symptoms seem unlikely related to ACS, CHF or COPD exacerbations, pneumonia, pneumothorax. Patient is nontoxic appearing and not in need of emergent medical intervention.  Strep was negative.  Throat culture and COVID test pending. Recommended symptom control with over the counter medications. Flonase sent for patient.  Kenalog paste prescribed for patient to apply to ulcers in mouth.  EKG was completed that was unremarkable.  Suspect increase in palpitations is related to anxiety given the patient reports that they started increasing in frequency in the month of June which is her late son's birthday.  Do not think that emergent evaluation in the hospital is necessary given that palpitations have been present since December and no obvious change other than frequency with increased possible anxiety.  Patient advised to follow-up with cardiology given increasing palpitations  for further evaluation and management.  Return if symptoms fail to improve in 1-2 weeks or you develop shortness of breath, chest pain, severe headache. Patient states understanding and is agreeable.  Discharged with PCP followup.  Final Clinical Impressions(s) / UC Diagnoses   Final diagnoses:  Viral upper respiratory infection  Mouth sores  Sore throat     Discharge Instructions      It appears that you have a viral  infection that is causing symptoms.  COVID test is pending.  Rapid strep was negative.  Throat culture is pending.  We will call if it is positive.  You have been prescribed 2 medications to alleviate symptoms.  A paste has been prescribed to apply directly to mouth sores.  Please follow-up if symptoms persist or worsen.  Please follow-up with cardiology and/or PCP for increased palpitations.    ED Prescriptions     Medication Sig Dispense Auth. Provider   fluticasone (FLONASE) 50 MCG/ACT nasal spray Place 1 spray into both nostrils daily for 3 days. 16 g Gustavus Bryant, [place]   triamcinolone (KENALOG)  0.1 % paste Use as directed 1 application. in the mouth or throat 2 (two) times daily. Apply to mouth sores 5 g Gustavus Bryant, [place]      PDMP not reviewed this encounter.   Gustavus Bryant, [place] 11/11/21 1534

## 2021-11-12 LAB — NOVEL CORONAVIRUS, NAA: SARS-CoV-2, NAA: NOT DETECTED

## 2021-11-13 LAB — CULTURE, GROUP A STREP (THRC)

## 2021-11-30 ENCOUNTER — Ambulatory Visit: Payer: BC Managed Care – PPO | Admitting: Physician Assistant

## 2021-12-01 ENCOUNTER — Other Ambulatory Visit: Payer: Self-pay | Admitting: Pharmacist

## 2021-12-01 DIAGNOSIS — M0579 Rheumatoid arthritis with rheumatoid factor of multiple sites without organ or systems involvement: Secondary | ICD-10-CM

## 2021-12-01 DIAGNOSIS — Z79899 Other long term (current) drug therapy: Secondary | ICD-10-CM

## 2021-12-01 NOTE — Progress Notes (Signed)
Next infusion scheduled for Orencia IV on 12/02/21 and due for updated orders.  Diagnosis: RA  Dose: 1000mg  every 28 days (appropriate based on last recorded weight of 107 kg)  Last Clinic Visit: 08/12/21 Next Clinic Visit: not scheduled  Last infusion: 11/04/21  Labs: 11/04/21 - CBC and CMP wnl TB Gold: negative on 08/12/21   Orders placed for Orencia IV x 3 doses along with premedication of acetaminophen and diphenhydramine to be administered 30 minutes before medication infusion.  Standing CBC with diff/platelet and CMP with GFR orders placed to be drawn every 2 months.  Next TB gold due 08/13/22.   Chesley Mires, PharmD, MPH, BCPS, CPP Clinical Pharmacist (Rheumatology and Pulmonology)

## 2021-12-02 ENCOUNTER — Ambulatory Visit (HOSPITAL_COMMUNITY)
Admission: RE | Admit: 2021-12-02 | Discharge: 2021-12-02 | Disposition: A | Discharge status: Home / Self Care | Payer: BC Managed Care – PPO | Source: Ambulatory Visit | Attending: Rheumatology | Admitting: Rheumatology

## 2021-12-02 DIAGNOSIS — Z79899 Other long term (current) drug therapy: Secondary | ICD-10-CM | POA: Insufficient documentation

## 2021-12-02 DIAGNOSIS — M0579 Rheumatoid arthritis with rheumatoid factor of multiple sites without organ or systems involvement: Secondary | ICD-10-CM | POA: Insufficient documentation

## 2021-12-02 MED ORDER — SODIUM CHLORIDE 0.9 % IV SOLN
1000.0000 mg | INTRAVENOUS | Status: DC
  Administered 2021-12-02: 1000 mg via INTRAVENOUS
  Filled 2021-12-02: qty 40

## 2021-12-02 MED ORDER — DIPHENHYDRAMINE HCL 25 MG PO CAPS
ORAL_CAPSULE | ORAL | Status: AC
  Filled 2021-12-02: qty 1

## 2021-12-02 MED ORDER — DIPHENHYDRAMINE HCL 25 MG PO CAPS
25.0000 mg | ORAL_CAPSULE | ORAL | Status: DC
  Administered 2021-12-02: 25 mg via ORAL

## 2021-12-02 MED ORDER — ACETAMINOPHEN 325 MG PO TABS
650.0000 mg | ORAL_TABLET | ORAL | Status: DC
  Administered 2021-12-02: 650 mg via ORAL

## 2021-12-02 MED ORDER — ACETAMINOPHEN 325 MG PO TABS
ORAL_TABLET | ORAL | Status: AC
  Filled 2021-12-02: qty 2

## 2021-12-14 ENCOUNTER — Other Ambulatory Visit: Payer: BC Managed Care – PPO

## 2021-12-14 DIAGNOSIS — E7800 Pure hypercholesterolemia, unspecified: Secondary | ICD-10-CM

## 2021-12-14 LAB — LIPID PANEL
Chol/HDL Ratio: 3.4 ratio (ref 0.0–4.4)
Cholesterol, Total: 172 mg/dL (ref 100–199)
HDL: 51 mg/dL (ref >39)
LDL Chol Calc (NIH): 104 mg/dL — ABNORMAL HIGH (ref 0–99)
Triglycerides: 90 mg/dL (ref 0–149)
VLDL Cholesterol Cal: 17 mg/dL (ref 5–40)

## 2021-12-14 LAB — ALT: ALT: 13 IU/L (ref 0–32)

## 2021-12-15 ENCOUNTER — Telehealth: Payer: Self-pay

## 2021-12-15 DIAGNOSIS — E7800 Pure hypercholesterolemia, unspecified: Secondary | ICD-10-CM

## 2021-12-15 MED ORDER — DILTIAZEM HCL ER COATED BEADS 180 MG PO CP24: 180.0000 mg | ORAL_CAPSULE | Freq: Every day | ORAL | 3 refills | Status: DC

## 2021-12-15 MED ORDER — NITROGLYCERIN 0.4 MG SL SUBL: 0.4000 mg | SUBLINGUAL_TABLET | SUBLINGUAL | 3 refills | Status: DC | PRN

## 2021-12-15 MED ORDER — ROSUVASTATIN CALCIUM 40 MG PO TABS: 40.0000 mg | ORAL_TABLET | Freq: Every day | ORAL | 3 refills | Status: DC

## 2021-12-15 MED ORDER — EZETIMIBE 10 MG PO TABS: 10.0000 mg | ORAL_TABLET | Freq: Every day | ORAL | 3 refills | Status: DC

## 2021-12-15 NOTE — Telephone Encounter (Signed)
-----   Message from Awilda Metro, RPH-CPP sent at 12/15/2021 10:57 AM EDT ----- Would clarify what pt has been taking for her cholesterol, she has rosuvastatin 40mg  daily and ezetimibe 10mg  daily on her med list, but fill records show she's only picking up rosuvastatin and not ezetimibe. If she hasn't been taking ezetimibe, she should start this, continue rosuvastatin and recheck lipids in 2-3 months. If she is taking ezetimibe, recommend referral to lipid clinic to discuss additional options.

## 2021-12-15 NOTE — Telephone Encounter (Signed)
The patient has been notified of the result and verbalized understanding.  All questions (if any) were answered. Theresia Majors, RN 12/15/2021 11:22 AM  Patient has only been taking Crestor 20 mg daily.  She will increase her dose to Crestor 40 mg daily and start taking Zetia 10 mg daily. Labs have been ordered, she will call back to schedule an appointment.

## 2021-12-16 ENCOUNTER — Other Ambulatory Visit: Payer: BC Managed Care – PPO

## 2021-12-24 ENCOUNTER — Other Ambulatory Visit: Payer: Self-pay | Admitting: Obstetrics and Gynecology

## 2021-12-24 ENCOUNTER — Other Ambulatory Visit (HOSPITAL_COMMUNITY)
Admission: RE | Admit: 2021-12-24 | Discharge: 2021-12-24 | Disposition: A | Discharge status: Home / Self Care | Payer: BC Managed Care – PPO | Source: Ambulatory Visit | Attending: Obstetrics and Gynecology | Admitting: Obstetrics and Gynecology

## 2021-12-24 DIAGNOSIS — Z01419 Encounter for gynecological examination (general) (routine) without abnormal findings: Secondary | ICD-10-CM | POA: Insufficient documentation

## 2021-12-29 LAB — CYTOLOGY - PAP
Comment: NEGATIVE
Diagnosis: NEGATIVE
High risk HPV: NEGATIVE

## 2021-12-30 ENCOUNTER — Ambulatory Visit (HOSPITAL_COMMUNITY)
Admission: RE | Admit: 2021-12-30 | Discharge: 2021-12-30 | Disposition: A | Discharge status: Home / Self Care | Payer: BC Managed Care – PPO | Source: Ambulatory Visit | Attending: Rheumatology | Admitting: Rheumatology

## 2021-12-30 DIAGNOSIS — Z79899 Other long term (current) drug therapy: Secondary | ICD-10-CM | POA: Insufficient documentation

## 2021-12-30 DIAGNOSIS — M0579 Rheumatoid arthritis with rheumatoid factor of multiple sites without organ or systems involvement: Secondary | ICD-10-CM | POA: Insufficient documentation

## 2021-12-30 LAB — CBC WITH DIFFERENTIAL/PLATELET
Abs Immature Granulocytes: 0.02 10*3/uL (ref 0.00–0.07)
Basophils Absolute: 0.1 10*3/uL (ref 0.0–0.1)
Basophils Relative: 1 %
Eosinophils Absolute: 0.2 10*3/uL (ref 0.0–0.5)
Eosinophils Relative: 2 %
HCT: 43.3 % (ref 36.0–46.0)
Hemoglobin: 13.6 g/dL (ref 12.0–15.0)
Immature Granulocytes: 0 %
Lymphocytes Relative: 51 %
Lymphs Abs: 4.3 10*3/uL — ABNORMAL HIGH (ref 0.7–4.0)
MCH: 26.5 pg (ref 26.0–34.0)
MCHC: 31.4 g/dL (ref 30.0–36.0)
MCV: 84.2 fL (ref 80.0–100.0)
Monocytes Absolute: 0.4 10*3/uL (ref 0.1–1.0)
Monocytes Relative: 5 %
Neutro Abs: 3.4 10*3/uL (ref 1.7–7.7)
Neutrophils Relative %: 41 %
Platelets: 253 10*3/uL (ref 150–400)
RBC: 5.14 MIL/uL — ABNORMAL HIGH (ref 3.87–5.11)
RDW: 12.6 % (ref 11.5–15.5)
WBC: 8.3 10*3/uL (ref 4.0–10.5)
nRBC: 0 % (ref 0.0–0.2)

## 2021-12-30 MED ORDER — SODIUM CHLORIDE 0.9 % IV SOLN
1000.0000 mg | INTRAVENOUS | Status: DC
  Administered 2021-12-30: 1000 mg via INTRAVENOUS
  Filled 2021-12-30: qty 40

## 2021-12-30 MED ORDER — ACETAMINOPHEN 325 MG PO TABS
ORAL_TABLET | ORAL | Status: AC
  Administered 2021-12-30: 650 mg via ORAL
  Filled 2021-12-30: qty 2

## 2021-12-30 MED ORDER — DIPHENHYDRAMINE HCL 25 MG PO CAPS
ORAL_CAPSULE | ORAL | Status: AC
  Administered 2021-12-30: 25 mg via ORAL
  Filled 2021-12-30: qty 1

## 2021-12-30 MED ORDER — DIPHENHYDRAMINE HCL 25 MG PO CAPS: 25.0000 mg | ORAL_CAPSULE | ORAL | Status: DC

## 2021-12-30 MED ORDER — ACETAMINOPHEN 325 MG PO TABS: 650.0000 mg | ORAL_TABLET | ORAL | Status: DC

## 2021-12-30 NOTE — Progress Notes (Signed)
RBC count is borderline elevated. Absolute lymphocytes are borderline elevated.  We will continue to monitor CBC with diff closely.

## 2022-01-13 ENCOUNTER — Other Ambulatory Visit: Payer: Self-pay | Admitting: Physician Assistant

## 2022-01-13 NOTE — Telephone Encounter (Signed)
Patient states she has had 2 deaths in her family and will have to call back to schedule an appointment.

## 2022-01-13 NOTE — Telephone Encounter (Signed)
Please schedule patient a follow up visit. Patient was due June 2023. Thanks!  

## 2022-01-13 NOTE — Telephone Encounter (Signed)
Next Visit: Due June 2023. Message sent to the front to schedule patient    Last Visit: 08/12/2021   Last Fill: 07/16/2021   Dx: Primary osteoarthritis of both knees    Current Dose per office note on 08/12/2021: not discussed   Okay to refill Voltaren Gel?

## 2022-01-24 ENCOUNTER — Other Ambulatory Visit: Payer: Self-pay | Admitting: Rheumatology

## 2022-01-27 ENCOUNTER — Other Ambulatory Visit: Payer: Self-pay | Admitting: Pharmacist

## 2022-01-27 ENCOUNTER — Encounter: Payer: Self-pay | Admitting: Physician Assistant

## 2022-01-27 ENCOUNTER — Other Ambulatory Visit: Payer: Self-pay

## 2022-01-27 ENCOUNTER — Ambulatory Visit (INDEPENDENT_AMBULATORY_CARE_PROVIDER_SITE_OTHER): Payer: BC Managed Care – PPO | Admitting: Physician Assistant

## 2022-01-27 ENCOUNTER — Ambulatory Visit (HOSPITAL_COMMUNITY)
Admission: RE | Admit: 2022-01-27 | Discharge: 2022-01-27 | Disposition: A | Discharge status: Home / Self Care | Payer: BC Managed Care – PPO | Source: Ambulatory Visit | Attending: Rheumatology | Admitting: Rheumatology

## 2022-01-27 VITALS — BP 127/85 | HR 86 | Resp 15 | Ht 62.0 in | Wt 263.0 lb

## 2022-01-27 VITALS — BP 151/76 | HR 80 | Temp 98.0°F | Resp 18 | Wt 260.0 lb

## 2022-01-27 DIAGNOSIS — M7061 Trochanteric bursitis, right hip: Secondary | ICD-10-CM | POA: Diagnosis not present

## 2022-01-27 DIAGNOSIS — M069 Rheumatoid arthritis, unspecified: Secondary | ICD-10-CM | POA: Insufficient documentation

## 2022-01-27 DIAGNOSIS — R5383 Other fatigue: Secondary | ICD-10-CM

## 2022-01-27 DIAGNOSIS — M170 Bilateral primary osteoarthritis of knee: Secondary | ICD-10-CM

## 2022-01-27 DIAGNOSIS — M0579 Rheumatoid arthritis with rheumatoid factor of multiple sites without organ or systems involvement: Secondary | ICD-10-CM

## 2022-01-27 DIAGNOSIS — R911 Solitary pulmonary nodule: Secondary | ICD-10-CM

## 2022-01-27 DIAGNOSIS — Z8639 Personal history of other endocrine, nutritional and metabolic disease: Secondary | ICD-10-CM | POA: Diagnosis present

## 2022-01-27 DIAGNOSIS — Z79899 Other long term (current) drug therapy: Secondary | ICD-10-CM

## 2022-01-27 DIAGNOSIS — U071 COVID-19: Secondary | ICD-10-CM | POA: Diagnosis present

## 2022-01-27 DIAGNOSIS — G4709 Other insomnia: Secondary | ICD-10-CM

## 2022-01-27 DIAGNOSIS — M7062 Trochanteric bursitis, left hip: Secondary | ICD-10-CM

## 2022-01-27 DIAGNOSIS — Z9884 Bariatric surgery status: Secondary | ICD-10-CM | POA: Diagnosis present

## 2022-01-27 DIAGNOSIS — E785 Hyperlipidemia, unspecified: Secondary | ICD-10-CM

## 2022-01-27 DIAGNOSIS — M797 Fibromyalgia: Secondary | ICD-10-CM | POA: Insufficient documentation

## 2022-01-27 DIAGNOSIS — Z8679 Personal history of other diseases of the circulatory system: Secondary | ICD-10-CM | POA: Diagnosis present

## 2022-01-27 DIAGNOSIS — Z8659 Personal history of other mental and behavioral disorders: Secondary | ICD-10-CM

## 2022-01-27 LAB — COMPREHENSIVE METABOLIC PANEL
ALT: 18 U/L (ref 0–44)
AST: 22 U/L (ref 15–41)
Albumin: 3.4 g/dL — ABNORMAL LOW (ref 3.5–5.0)
Alkaline Phosphatase: 68 U/L (ref 38–126)
Anion gap: 6 (ref 5–15)
BUN: 10 mg/dL (ref 6–20)
CO2: 27 mmol/L (ref 22–32)
Calcium: 9 mg/dL (ref 8.9–10.3)
Chloride: 107 mmol/L (ref 98–111)
Creatinine, Ser: 0.9 mg/dL (ref 0.44–1.00)
GFR, Estimated: >60 mL/min (ref >60)
Glucose, Bld: 92 mg/dL (ref 70–99)
Potassium: 4 mmol/L (ref 3.5–5.1)
Sodium: 140 mmol/L (ref 135–145)
Total Bilirubin: 0.4 mg/dL (ref 0.3–1.2)
Total Protein: 6.7 g/dL (ref 6.5–8.1)

## 2022-01-27 LAB — CBC WITH DIFFERENTIAL/PLATELET
Abs Immature Granulocytes: 0.03 10*3/uL (ref 0.00–0.07)
Basophils Absolute: 0.1 10*3/uL (ref 0.0–0.1)
Basophils Relative: 1 %
Eosinophils Absolute: 0.2 10*3/uL (ref 0.0–0.5)
Eosinophils Relative: 2 %
HCT: 43.7 % (ref 36.0–46.0)
Hemoglobin: 13.8 g/dL (ref 12.0–15.0)
Immature Granulocytes: 0 %
Lymphocytes Relative: 44 %
Lymphs Abs: 3.8 10*3/uL (ref 0.7–4.0)
MCH: 26.7 pg (ref 26.0–34.0)
MCHC: 31.6 g/dL (ref 30.0–36.0)
MCV: 84.7 fL (ref 80.0–100.0)
Monocytes Absolute: 0.7 10*3/uL (ref 0.1–1.0)
Monocytes Relative: 8 %
Neutro Abs: 3.9 10*3/uL (ref 1.7–7.7)
Neutrophils Relative %: 45 %
Platelets: 282 10*3/uL (ref 150–400)
RBC: 5.16 MIL/uL — ABNORMAL HIGH (ref 3.87–5.11)
RDW: 12.6 % (ref 11.5–15.5)
WBC: 8.6 10*3/uL (ref 4.0–10.5)
nRBC: 0 % (ref 0.0–0.2)

## 2022-01-27 MED ORDER — DICLOFENAC SODIUM 1 % EX GEL: CUTANEOUS | 2 refills | Status: DC

## 2022-01-27 MED ORDER — DIPHENHYDRAMINE HCL 25 MG PO CAPS
25.0000 mg | ORAL_CAPSULE | ORAL | Status: DC
  Administered 2022-01-27: 25 mg via ORAL

## 2022-01-27 MED ORDER — GABAPENTIN 300 MG PO CAPS: ORAL_CAPSULE | ORAL | 2 refills | Status: DC

## 2022-01-27 MED ORDER — SODIUM CHLORIDE 0.9 % IV SOLN
1000.0000 mg | INTRAVENOUS | Status: DC
  Administered 2022-01-27: 1000 mg via INTRAVENOUS
  Filled 2022-01-27: qty 40

## 2022-01-27 MED ORDER — ACETAMINOPHEN 325 MG PO TABS
ORAL_TABLET | ORAL | Status: AC
  Filled 2022-01-27: qty 2

## 2022-01-27 MED ORDER — FABB 2.2-25-1 MG PO TABS: 1.0000 | ORAL_TABLET | Freq: Every day | ORAL | 2 refills | Status: DC

## 2022-01-27 MED ORDER — DIPHENHYDRAMINE HCL 25 MG PO CAPS
ORAL_CAPSULE | ORAL | Status: AC
  Filled 2022-01-27: qty 1

## 2022-01-27 MED ORDER — ACETAMINOPHEN 325 MG PO TABS
650.0000 mg | ORAL_TABLET | ORAL | Status: DC
  Administered 2022-01-27: 650 mg via ORAL

## 2022-01-27 MED ORDER — CYCLOBENZAPRINE HCL 5 MG PO TABS: 5.0000 mg | ORAL_TABLET | Freq: Every evening | ORAL | 2 refills | Status: DC | PRN

## 2022-01-27 NOTE — Progress Notes (Signed)
Office Visit Note  Patient: [name] [name]             Date of Birth: [birth year]           MRN: [id number]             PCP: [name], MD Referring: [name], MD Visit Date: 01/27/2022 Occupation: @  Subjective:  Fibromyalgia flare  History of Present Illness: [name] [name] is a 56 y.o. female with history of seropositive rheumatoid arthritis, osteoarthritis, and fibromyalgia.  Patient remains on IV Orencia infusions every month, Rasuvo 25 mg subcu injections once weekly, and folic acid 2 mg daily.  Her most recent infusion was administered today.  She has not missed any doses of Rasuvo recently.  She denies any signs or symptoms of a rheumatoid arthritis flare.  She has occasional arthralgias and joint stiffness which she attributes to weather changes.  She is currently having a fibromyalgia flare.  She has generalized myalgias and muscle tenderness.  She is also having increased fatigue.  Patient requested a refill of FaBB, Voltaren gel, gabapentin, and Flexeril.  She continues to find this regimen to be effective at managing her symptoms. She denies any recent or recurrent infections.     Activities of Daily Living:  Patient reports morning stiffness for 1-2 hours.   Patient Reports nocturnal pain.  Difficulty dressing/grooming: Reports Difficulty climbing stairs: Reports Difficulty getting out of chair: Reports Difficulty using hands for taps, buttons, cutlery, and/or writing: Reports  Review of Systems  Constitutional:  Positive for fatigue.  HENT:  Positive for mouth dryness. Negative for mouth sores.   Eyes:  Positive for dryness.  Respiratory:  Negative for shortness of breath.   Cardiovascular:  Negative for chest pain and palpitations.  Gastrointestinal:  Positive for blood in stool. Negative for constipation and diarrhea.  Endocrine: Negative for increased urination.  Genitourinary:  Negative for involuntary urination.   Musculoskeletal:  Positive for joint pain, joint pain, joint swelling, myalgias, muscle weakness, morning stiffness, muscle tenderness and myalgias. Negative for gait problem.  Skin:  Positive for color change and sensitivity to sunlight. Negative for rash and hair loss.  Allergic/Immunologic: Negative for susceptible to infections.  Neurological:  Positive for headaches. Negative for dizziness.  Hematological:  Negative for swollen glands.  Psychiatric/Behavioral:  Positive for sleep disturbance. Negative for depressed mood. The patient is nervous/anxious.     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   Dyslipidemia 01/30/2015   MDD (major depressive disorder), recurrent severe, 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   Essential hypertension 08/25/2014   Morbid obesity (HCC) 08/25/2014   CAD (coronary artery disease), native coronary artery 08/24/2014   Numbness and tingling in left arm    Arm numbness left 08/22/2014   Chest pain 08/22/2014   Gastroesophageal reflux disease without esophagitis 07/23/2014   Coronary artery calcification seen on CAT scan 02/19/2014   Hip pain 10/23/2013   Solitary pulmonary  nodule 08/28/2013   Chest pain, atypical 07/18/2013   Heart palpitations 06/27/2013   Cough 06/04/2013   Diastolic dysfunction 05/18/2013   SOB (shortness of breath) 05/09/2013   Other malaise and fatigue 01/21/2013   Stress  and adjustment reaction 12/09/2012   Right sided abdominal pain 11/29/2012   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/21/2012   Morbid obesity with BMI of 50.0-59.9, adult (HCC) 11/21/2012   Arthritis 07/13/2012   Depression 07/13/2012   Routine health maintenance 05/07/2012   Migraine 06/07/1998    Past Medical History:  Diagnosis Date   Anxiety    Bursitis    CAD (coronary artery disease), native coronary artery    Coronary CTA showed minimal calcified plaque in the proximal and mid LAD less than 25%, mild left atrial enlargement and a coronary calcium score to 73 which is 99th percentile for age and sex matched controls by coronary CTA 10-2021   Depression    Diastolic dysfunction    Dyslipidemia 01/30/2015   Esophageal ring    Fibromyalgia    Hiatal hernia    HSV-1 infection    Morbid obesity (HCC)    Osteoarthritis    Rheumatoid arthritis(714.0)    M05.79   Rheumatoid arthritis, seropositive, 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HCC)    Treated with Orencia, TB neg 09/12/2015   Spondylolysis     Family History  Problem Relation Age of Onset   Hyperlipidemia Mother    Depression Mother    Sarcoidosis Father    Lung disease Father        Pleural Mesothelioma   Cancer Father    Heart attack Paternal Grandmother    Hypertension Paternal Grandmother    Hypertension Maternal Grandmother    Diabetes Maternal Grandmother    Stroke Neg Hx    Colon cancer Neg Hx    Esophageal cancer Neg Hx    Rectal cancer Neg Hx    Stomach cancer Neg Hx    Past Surgical History:  Procedure Laterality Date   CARDIAC CATHETERIZATION N/A 06/11/2016   Procedure: Left Heart Cath and Coronary Angiography;  Surgeon: Corky Crafts, MD;  Location: [facility];  Service: Cardiovascular;  Laterality: N/A;   CESAREAN SECTION     COLONOSCOPY  07/27/2017   ESOPHAGEAL MANOMETRY N/A 10/28/2014   Procedure: ESOPHAGEAL MANOMETRY (EM);  Surgeon: Hilarie Fredrickson, MD;  Location: [facility] END[facility];  Service:  Endoscopy;  Laterality: N/A;   HERNIA REPAIR     reports surgery on 3 hernias, with 2 more present   LAPAROSCOPIC GASTRIC SLEEVE RESECTION  11/21/12   [facility]   LEFT HEART CATHETERIZATION WITH CORONARY ANGIOGRAM N/A 08/26/2014   Procedure: LEFT HEART CATHETERIZATION WITH CORONARY ANGIOGRAM;  Surgeon: Runell Gess, MD;  Location: [facility] CATH LAB;  Service: Cardiovascular;  Laterality: N/A;   TUBAL LIGATION     Social History   Social History Narrative   Marital Status: Married Probation officer)    Children:  Son [name]) Daughter [name])    Pets: None    Living Situation: Lives with husband and children    Occupation: Occupational psychologist Administrator)     Education: Oncologist (Psychology)     Tobacco Use/Exposure:  None    Alcohol Use:  Occasional   Drug Use:  None   Diet:  Regular   Exercise: Walking or Treadmill (2 x week)   Hobbies: Clinical cytogeneticist, [employer]ations.               Immunization History  Administered Date(s)  Administered   PFIZER(Purple Top)SARS-COV-2 Vaccination 08/30/2019, 09/20/2019   Tdap 09/21/2016     Objective: Vital Signs: BP 127/85 (BP Location: Left Arm, Patient Position: Sitting, Cuff Size: Large)   Pulse 86   Resp 15   Ht  (1.575 m)   Wt 263 lb (119.3 kg)   BMI 48.10 kg/m    Physical Exam Vitals and nursing note reviewed.  Constitutional:      Appearance: She is well-developed.  HENT:     Head: Normocephalic and atraumatic.  Eyes:     Conjunctiva/sclera: Conjunctivae normal.  Cardiovascular:     Rate and Rhythm: Normal rate and regular rhythm.     Heart sounds: Normal heart sounds.  Pulmonary:     Effort: Pulmonary effort is normal.     Breath sounds: Normal breath sounds.  Abdominal:     General: Bowel sounds are normal.     Palpations: Abdomen is soft.  Musculoskeletal:     Cervical back: Normal range of motion.  Skin:    General: Skin is warm and dry.     Capillary Refill: Capillary refill takes less  than 2 seconds.  Neurological:     Mental Status: She is alert and oriented to person, place, and time.  Psychiatric:        Behavior: Behavior normal.      Musculoskeletal Exam: Generalized hyperalgesia and positive tender points on examination.  C-spine has limited range of motion with lateral rotation.  Trapezius muscle tension tenderness bilaterally.  Shoulder joints have good range of motion with some discomfort bilaterally.  Elbow joints, wrist joints, MCPs, PIPs, DIPs have good range of motion with no synovitis.  She is able to make a complete fist bilaterally.  Hip joints have good range of motion with some discomfort in the right hip.  Knee joints have good range of motion with no warmth or effusion.  Crepitus in both knees noted.  Ankle joints have good range of motion with no tenderness or joint swelling.  CDAI Exam: CDAI Score: 0.4  Patient Global: 2 mm; Provider Global: 2 mm Swollen: 0 ; Tender: 0  Joint Exam 01/27/2022   No joint exam has been documented for this visit   There is currently no information documented on the homunculus. Go to the Rheumatology activity and complete the homunculus joint exam.  Investigation: No additional findings.  Imaging: No results found.  Recent Labs: Lab Results  Component Value Date   WBC 8.6 01/27/2022   HGB 13.8 01/27/2022   PLT 282 01/27/2022   NA 140 01/27/2022   K 4.0 01/27/2022   CL 107 01/27/2022   CO2 27 01/27/2022   GLUCOSE 92 01/27/2022   BUN 10 01/27/2022   CREATININE 0.90 01/27/2022   BILITOT 0.4 01/27/2022   ALKPHOS 68 01/27/2022   AST 22 01/27/2022   ALT 18 01/27/2022   PROT 6.7 01/27/2022   ALBUMIN 3.4 (L) 01/27/2022   CALCIUM 9.0 01/27/2022   GFRAA >60 12/26/2019   QFTBGOLD Negative 09/01/2016   QFTBGOLDPLUS Negative 08/12/2021    Speciality Comments: ORENCIA 1000 mg x 4 weeks  Procedures:  No procedures performed Allergies: Influenza vaccines, Influenza virus vaccine, Amitriptyline, and  Isosorbide    Assessment / Plan:     Visit Diagnoses: Rheumatoid arthritis involving multiple sites with positive rheumatoid factor (HCC): She has no joint tenderness or synovitis on examination today.  She has not had any signs or symptoms of a rheumatoid arthritis flare.  She has occasional arthralgias and  joint stiffness which she attributes to weather changes.  Overall her rheumatoid arthritis remains well controlled on IV Orencia 1000 mg infusions every 28 days, Rasuvo 25 mg subcu injections once weekly, and folic acid 2 mg daily.  She is tolerating combination therapy without any side effects or missed doses.  Her last infusion was administered today.  She has not had any recent or recurrent infections.  She will remain on the current treatment regimen.  She was advised to notify us if she develops increased joint pain or joint swelling.  She will follow-up in the office in 5 months or sooner if needed.  High risk medication use - IV Orencia 1,000 mg infusions every 28 days, Rasuvo 25 mg per 0.5 mL subcutaneous weekly, and Folic acid 2 mg daily.  CBC and CMP updated today.  She will continue to have lab work drawn with infusions. TB gold negative on 08/12/21. No recent or recurrent infections.  Discussed the importance of postponing orencia infusions if she develop signs or symptoms of an infection to resume once the infection has cleared.   No new medical conditions.  Primary osteoarthritis of both knees - MRI of the right knee on 05/23/2020 which was negative for meniscal or ligament tear.  She has good range of motion of both knee joints with crepitus on examination.  No warmth or effusion was noted.  She uses Voltaren gel topically as needed for pain relief.  Trochanteric bursitis of both hips: She has tenderness palpation over the right trochanteric bursa on examination today.  Discussed the importance of performing stretching exercises.  Fibromyalgia - She has generalized hyperalgesia and  positive tender points on examination.  She is currently having a fibromyalgia flare.  She has trapezius muscle tension and tenderness bilaterally and some intercostal muscle tenderness on exam.  She is also experiencing increased fatigue today.  Overall her fibromyalgia has been well controlled on the current treatment regimen.  She remains on gabapentin, Flexeril, and FABB as prescribed.  She requested refills of gabapentin, Voltaren gel, Flexeril, and FABB to be sent to the pharmacy today.  Other insomnia: She continues to experience interrupted sleep at night.  Discussed the importance of good sleep hygiene.  Other fatigue: Chronic and and secondary to insomnia.  Discussed the importance of regular exercise and good sleep hygiene.  She takes FABB on a daily basis.  Refill be sent to the pharmacy today.  Other medical conditions are listed as follows:  History of hypertension: BP was 127/85 today in the office.   Dyslipidemia  History of coronary artery disease  History of diastolic dysfunction  Solitary pulmonary nodule  History of depression  History of vitamin D deficiency  S/P laparoscopic sleeve gastrectomy  COVID-19 virus infection - 06/2021.  Patient was treated with Paxlovid.   Orders: No orders of the defined types were placed in this encounter.  No orders of the defined types were placed in this encounter.    Follow-Up Instructions: Return in about 5 months (around 06/29/2022) for Rheumatoid arthritis, Osteoarthritis, Fibromyalgia.   Gearldine Bienenstock, PA-C  Note - This record has been created using Dragon software.  Chart creation errors have been sought, but may not always  have been located. Such creation errors do not reflect on  the standard of medical care.

## 2022-01-27 NOTE — Progress Notes (Unsigned)
Please review and sign pended refills that patient requested today at her appointment. Thanks!

## 2022-01-27 NOTE — Progress Notes (Signed)
Next infusion scheduled for Orencia IV on 02/24/22 and due for updated orders. Diagnosis: RA  Dose: 1000mg  every 4 weeks (appropriate based on last recorded weight of 119.3 kg)  Last Clinic Visit: 08/12/21 Next Clinic Visit: not yet scheduled, message sent to scheduling team  Last infusion: 01/27/22  Labs: CBC and CMP on 01/27/22 - Albumin remains low but has improved.   Rest of CMP WNL.  RBC count is borderline elevated but stable. Rest of CBC WNL TB Gold: negative on 08/12/21   Orders placed for Orencia IV x 3 doses along with premedication of acetaminophen and diphenhydramine to be administered 30 minutes before medication infusion.  Standing CBC with diff/platelet and CMP with GFR orders placed to be drawn every 2 months.  Next TB gold due 08/13/22  10/13/22, PharmD, MPH, BCPS, CPP Clinical Pharmacist (Rheumatology and Pulmonology)

## 2022-02-04 MED ORDER — LIDOCAINE 5 % EX PTCH: MEDICATED_PATCH | CUTANEOUS | 2 refills | Status: DC

## 2022-02-04 NOTE — Telephone Encounter (Signed)
Next Visit: 07/09/2022  Last Visit: 01/27/2022  Last Fill: 03/20/2021   Dx: Trochanteric bursitis of both hips  Current Dose per office note on 01/27/2022: not discussed  Okay to refill Lidocaine patches?

## 2022-02-12 ENCOUNTER — Other Ambulatory Visit: Payer: Self-pay

## 2022-02-12 ENCOUNTER — Emergency Department (HOSPITAL_BASED_OUTPATIENT_CLINIC_OR_DEPARTMENT_OTHER)
Admission: EM | Admit: 2022-02-12 | Discharge: 2022-02-12 | Disposition: A | Discharge status: Home / Self Care | Payer: BC Managed Care – PPO | Attending: Emergency Medicine | Admitting: Emergency Medicine

## 2022-02-12 ENCOUNTER — Encounter (HOSPITAL_BASED_OUTPATIENT_CLINIC_OR_DEPARTMENT_OTHER): Payer: Self-pay | Admitting: Emergency Medicine

## 2022-02-12 ENCOUNTER — Emergency Department (HOSPITAL_BASED_OUTPATIENT_CLINIC_OR_DEPARTMENT_OTHER): Payer: BC Managed Care – PPO | Admitting: Radiology

## 2022-02-12 DIAGNOSIS — Z7982 Long term (current) use of aspirin: Secondary | ICD-10-CM | POA: Diagnosis not present

## 2022-02-12 DIAGNOSIS — I2510 Atherosclerotic heart disease of native coronary artery without angina pectoris: Secondary | ICD-10-CM | POA: Diagnosis not present

## 2022-02-12 DIAGNOSIS — R519 Headache, unspecified: Secondary | ICD-10-CM | POA: Insufficient documentation

## 2022-02-12 DIAGNOSIS — Z79899 Other long term (current) drug therapy: Secondary | ICD-10-CM | POA: Insufficient documentation

## 2022-02-12 DIAGNOSIS — R079 Chest pain, unspecified: Secondary | ICD-10-CM | POA: Diagnosis present

## 2022-02-12 LAB — BASIC METABOLIC PANEL
Anion gap: 10 (ref 5–15)
BUN: 9 mg/dL (ref 6–20)
CO2: 27 mmol/L (ref 22–32)
Calcium: 8.8 mg/dL — ABNORMAL LOW (ref 8.9–10.3)
Chloride: 106 mmol/L (ref 98–111)
Creatinine, Ser: 0.71 mg/dL (ref 0.44–1.00)
GFR, Estimated: >60 mL/min (ref >60)
Glucose, Bld: 84 mg/dL (ref 70–99)
Potassium: 3.8 mmol/L (ref 3.5–5.1)
Sodium: 143 mmol/L (ref 135–145)

## 2022-02-12 LAB — CBC
HCT: 41.9 % (ref 36.0–46.0)
Hemoglobin: 13.4 g/dL (ref 12.0–15.0)
MCH: 26.7 pg (ref 26.0–34.0)
MCHC: 32 g/dL (ref 30.0–36.0)
MCV: 83.5 fL (ref 80.0–100.0)
Platelets: 265 10*3/uL (ref 150–400)
RBC: 5.02 MIL/uL (ref 3.87–5.11)
RDW: 12.7 % (ref 11.5–15.5)
WBC: 8.8 10*3/uL (ref 4.0–10.5)
nRBC: 0 % (ref 0.0–0.2)

## 2022-02-12 LAB — TROPONIN I (HIGH SENSITIVITY)
Troponin I (High Sensitivity): 3 ng/L (ref <18)
Troponin I (High Sensitivity): 2 ng/L (ref <18)

## 2022-02-12 MED ORDER — PROCHLORPERAZINE EDISYLATE 10 MG/2ML IJ SOLN
10.0000 mg | Freq: Once | INTRAMUSCULAR | Status: AC
  Administered 2022-02-12: 10 mg via INTRAVENOUS
  Filled 2022-02-12: qty 2

## 2022-02-12 MED ORDER — KETOROLAC TROMETHAMINE 15 MG/ML IJ SOLN
15.0000 mg | Freq: Once | INTRAMUSCULAR | Status: AC
  Administered 2022-02-12: 15 mg via INTRAVENOUS
  Filled 2022-02-12: qty 1

## 2022-02-12 MED ORDER — DEXAMETHASONE SODIUM PHOSPHATE 10 MG/ML IJ SOLN
10.0000 mg | Freq: Once | INTRAMUSCULAR | Status: AC
  Administered 2022-02-12: 10 mg via INTRAVENOUS
  Filled 2022-02-12: qty 1

## 2022-02-12 NOTE — ED Notes (Signed)
Patient ambulates to rest room. Steady gait. Reports feeling better after medications

## 2022-02-12 NOTE — ED Notes (Signed)
Reviewed AVS/discharge instruction with patient. Time allotted for and all questions answered. Patient is agreeable for d/c and escorted to ed exit by staff.  

## 2022-02-12 NOTE — ED Notes (Signed)
Patient called c/o headache pain, requesting pain medication. Provider notified

## 2022-02-12 NOTE — ED Notes (Signed)
Provider at bedside

## 2022-02-12 NOTE — ED Provider Notes (Signed)
[facility] EMERGENCY DEPT Provider Note   CSN: [id number] Arrival date & time: 02/12/22  1620     History  Chief Complaint  Patient presents with   Chest Pain    [name] [name] is a 56 y.o. female with diastolic dysfunction, rheumatoid arthritis, CAD and fibromyalgia presenting for chest pain.  Chest pain started today with associated shortness of breath and feeling of palpitations.  Chest pain is in the middle of the chest radiates to the back and shoulder leads.  Also endorses a headache a couple days ago.  Headache is primarily located "behind the eyes".  Endorses visual disturbance.  But no fever or nuchal rigidity.   Chest Pain      Home Medications Prior to Admission medications   Medication Sig Start Date End Date Taking? Authorizing Provider  Abatacept (ORENCIA IV) Inject 1,000 mg into the vein every 28 (twenty-eight) days.    Pollyann Savoy, MD  Abatacept (ORENCIA IV) 1,000 mLs every 28 (twenty-eight) days.    [provider]  acetaminophen-codeine (TYLENOL #3) 300-30 MG tablet Take 1 tablet by mouth every 6 (six) hours as needed for moderate pain. Patient not taking: Reported on 01/27/2022 09/21/21   Kirtland Bouchard, PA-C  acyclovir ointment (ZOVIRAX) 5 % as needed.    [provider]  Acyclovir-Hydrocortisone (XERESE) 5-1 % CREA as needed.    [provider]  Adapalene 0.3 % gel Apply 1 application topically daily as needed (acne).  12/31/14   [provider]  Alcohol Swabs (ALCOHOL WIPES) 70 % PADS Alcohol Prep Pads    [provider]  ARIPiprazole (ABILIFY) 2 MG tablet Take 2 mg by mouth daily. 09/25/21   [provider]  aspirin EC 81 MG tablet Take 1 tablet (81 mg total) by mouth daily. Swallow whole. 10/13/21   Quintella Reichert, MD  azelastine (ASTELIN) 0.1 % nasal spray Place 2 sprays into both nostrils 2 (two) times daily. Patient taking differently: Place 2 sprays into both nostrils as  needed. 01/12/20   Cathie Hoops, Amy V, PA-C  benzonatate (TESSALON) 100 MG capsule Take 1 capsule (100 mg total) by mouth 2 (two) times daily as needed for cough. 04/15/21   Freddy Finner, NP  betamethasone, augmented, (DIPROLENE) 0.05 % lotion Apply topically as needed. 01/23/22   [provider]  buPROPion (WELLBUTRIN XL) 150 MG 24 hr tablet Take 450 mg by mouth every morning. 05/23/19   [provider]  buPROPion (WELLBUTRIN XL) 150 MG 24 hr tablet Take 3 tablets by mouth every morning.    [provider]  cetirizine (ZYRTEC ALLERGY) 10 MG tablet Take 1 tablet (10 mg total) by mouth daily. 10/08/21   Wallis Bamberg, PA-C  Cetirizine HCl (ZYRTEC ALLERGY PO) 10 mg as needed.    [provider]  cetirizine-pseudoephedrine (ZYRTEC-D) 5-120 MG tablet Take 1 tablet by mouth 2 (two) times daily. Patient not taking: Reported on 01/27/2022 07/03/21   Bennie Pierini, FNP  ciclopirox (LOPROX) 0.77 % cream as needed.    [provider]  Ciclopirox 1 % shampoo Apply topically as needed. 01/25/22   [provider]  cyclobenzaprine (FLEXERIL) 5 MG tablet Take 1 tablet (5 mg total) by mouth at bedtime as needed. 01/27/22   Gearldine Bienenstock, PA-C  diclofenac Sodium (VOLTAREN) 1 % GEL Apply 2-4 grams to affected joint 4 times daily as needed. 01/27/22   Gearldine Bienenstock, PA-C  diltiazem (CARDIZEM CD) 180 MG 24 hr capsule Take 1 capsule (  180 mg total) by mouth daily. 12/15/21   Quintella Reichert, MD  doxepin (SINEQUAN) 25 MG capsule Take 25 mg by mouth at bedtime as needed. 04/28/19   [provider]  EPINEPHrine 0.3 mg/0.3 mL IJ SOAJ injection  07/02/19   [provider]  estradiol (ESTRACE) 0.5 MG tablet Take 0.5 mg by mouth daily. 09/16/21   [provider]  ezetimibe (ZETIA) 10 MG tablet Take 1 tablet (10 mg total) by mouth daily. 12/15/21   Quintella Reichert, MD  famciclovir (FAMVIR) 500 MG tablet Take 15,000 mg by mouth daily as needed (fever blister).   01/21/15   [provider]  famotidine (PEPCID) 20 MG tablet Take 20 mg by mouth as needed. 09/22/21   [provider]  famotidine (PEPCID) 20 MG tablet daily.    [provider]  fluticasone (FLONASE) 50 MCG/ACT nasal spray Place 1 spray into both nostrils daily for 3 days. 11/11/21 11/14/21  Gustavus Bryant, FNP  fluticasone (FLONASE) 50 MCG/ACT nasal spray as needed.    [provider]  Folic Acid-Vit B6-Vit B12 (FABB) 2.2-25-1 MG TABS Take 1 tablet by mouth daily. 01/27/22   Gearldine Bienenstock, PA-C  gabapentin (NEURONTIN) 300 MG capsule TAKE ONE CAPSULE BY MOUTH THREE TIMES A DAY AS NEEDED FOR PAIN 01/27/22   Gearldine Bienenstock, PA-C  hydrocortisone 2.5 % cream SMARTSIG:1 Topical Every Night 11/06/19   [provider]  hydrOXYzine (VISTARIL) 25 MG capsule Take 25 mg by mouth as needed. 10/04/21   [provider]  ketoconazole (NIZORAL) 2 % cream  03/14/20   [provider]  ketoconazole (NIZORAL) 2 % shampoo  06/03/20   [provider]  levothyroxine (SYNTHROID) 25 MCG tablet as needed.    [provider]  lidocaine (LIDODERM) 5 % Apply 1 patch topically as needed.    [provider]  lidocaine (LIDODERM) 5 % APPLY 1 PATCH TO AFFECTED AREA FOR 12 HOURS IN A 24 HOUR PERIOD 02/04/22   Gearldine Bienenstock, PA-C  LINZESS 145 MCG CAPS capsule Take 145 mcg by mouth daily. 01/26/22   [provider]  LORazepam (ATIVAN) 1 MG tablet Take 1 tablet (1 mg total) by mouth 3 (three) times daily as needed for anxiety. 10/09/19   Moshe Cipro, NP  Methotrexate, PF, (RASUVO) 25 MG/0.5ML SOAJ INJECT ONE PEN SUBCUTANEOUSLY ONCE EVERY WEEK. STORE AT ROOM TEMPERATURE BETWEEN 68 - 77 DEGREES F. 08/12/21   Pollyann Savoy, MD  metoprolol tartrate (LOPRESSOR) 100 MG tablet Take 1 tablet (100 mg total) by mouth once for 1 dose. Take 1 tablet (100 mg total) two hours prior to CT scan. 10/13/21 10/13/21  Quintella Reichert, MD   neomycin-polymyxin-hydrocortisone (CORTISPORIN) 3.5-10000-1 OTIC suspension as needed.    [provider]  nitroGLYCERIN (NITROSTAT) 0.4 MG SL tablet Place 1 tablet (0.4 mg total) under the tongue every 5 (five) minutes as needed for chest pain. 12/15/21   Quintella Reichert, MD  [employer] powder Apply topically. 03/17/20   [provider]  nystatin cream (MYCOSTATIN) Apply topically. 01/23/22   [provider]  Oxymetazoline HCl (NASAL SPRAY) 0.05 % SOLN Nasal Decongestant (oxymetazoline) 0.05 % spray    [provider]  predniSONE (DELTASONE) 20 MG tablet Take 2 tablets daily with breakfast. Patient not taking: Reported on 01/27/2022 10/08/21   Wallis Bamberg, PA-C  progesterone (PROMETRIUM) 100 MG capsule Take 100 mg by mouth daily. 09/16/21   [provider]  PROGESTERONE PO 100 mg  daily.    [provider]  promethazine-dextromethorphan (PROMETHAZINE-DM) 6.25-15 MG/5ML syrup Take 2.5 mLs by mouth 4 (four) times daily as needed for cough. 10/08/21   Wallis Bamberg, PA-C  propranolol (INDERAL) 10 MG tablet Take 10 mg by mouth daily.    [provider]  pseudoephedrine (SUDAFED) 60 MG tablet Take 1 tablet (60 mg total) by mouth every 8 (eight) hours as needed for congestion. 10/08/21   Wallis Bamberg, PA-C  rizatriptan (MAXALT) 10 MG tablet Take 1 tablet (10 mg total) by mouth as needed for migraine. May repeat in 2 hours if needed Patient not taking: Reported on 01/27/2022 04/15/21   Freddy Finner, NP  rosuvastatin (CRESTOR) 40 MG tablet Take 1 tablet (40 mg total) by mouth daily. 12/15/21   Quintella Reichert, MD  triamcinolone (KENALOG) 0.1 % paste Use as directed 1 application. in the mouth or throat 2 (two) times daily. Apply to mouth sores 11/11/21   Gustavus Bryant, FNP  Triamcinolone Acetonide 0.025 % LOTN as needed.    [provider]  valACYclovir (VALTREX) 500 MG tablet Take 500 mg by mouth as needed. 09/17/21   [provider]  Vitamin  D, Ergocalciferol, (DRISDOL) 1.25 MG (50000 UT) CAPS capsule Take 50,000 Units by mouth See admin instructions. Take 1 tablet (50000 units) by mouth every Monday, Wednesday, Saturday    [provider]      Allergies    Influenza vaccines, Influenza virus vaccine, Amitriptyline, and Isosorbide    Review of Systems   Review of Systems  Cardiovascular:  Positive for chest pain.    Physical Exam Updated Vital Signs BP 132/85   Pulse 60   Temp 97.6 F (36.4 C) (Oral)   Resp 18   Ht 5\' 2"  (1.575 m)   Wt 117.9 kg   SpO2 100%   BMI 47.55 kg/m  Physical Exam Vitals and nursing note reviewed.  HENT:     Head: Normocephalic and atraumatic.     Mouth/Throat:     Mouth: Mucous membranes are moist.  Eyes:     General:        Right eye: No discharge.        Left eye: No discharge.     Conjunctiva/sclera: Conjunctivae normal.  Cardiovascular:     Rate and Rhythm: Normal rate and regular rhythm.     Pulses: Normal pulses.     Heart sounds: Normal heart sounds.  Pulmonary:     Effort: Pulmonary effort is normal.     Breath sounds: Normal breath sounds.  Abdominal:     General: Abdomen is flat.     Palpations: Abdomen is soft.  Skin:    General: Skin is warm and dry.  Neurological:     General: No focal deficit present.     Comments: GCS 15. Speech is goal oriented. No deficits appreciated to CN III-XII; symmetric eyebrow raise, no facial drooping, tongue midline. Patient has equal grip strength bilaterally with 5/5 strength against resistance in all major muscle groups bilaterally. Sensation to light touch intact. Patient moves extremities without ataxia. Normal finger-nose-finger. Patient ambulatory with steady gait.   Psychiatric:        Mood and Affect: Mood normal.     ED Results / Procedures / Treatments   Labs (all labs ordered are listed, but only abnormal results are displayed) Labs Reviewed  BASIC METABOLIC PANEL - Abnormal; Notable for the following  components:      Result Value  Calcium 8.8 (*)    All other components within normal limits  CBC  TROPONIN I (HIGH SENSITIVITY)  TROPONIN I (HIGH SENSITIVITY)    EKG None  Radiology DG Chest 2 View  Result Date: 02/12/2022 CLINICAL DATA:  Chest pain EXAM: CHEST - 2 VIEW COMPARISON:  Previous studies including the examination of 10/05/2021 FINDINGS: Transverse diameter of heart is in upper limits of normal. There are no signs of pulmonary edema. There is poor inspiration. There is no focal pulmonary consolidation. There is no pleural effusion or pneumothorax. Possible small hiatal hernia is seen. IMPRESSION: There are no signs of pulmonary edema or focal pulmonary consolidation. Electronically Signed   By: Ernie Avena M.D.   On: 02/12/2022 17:08    Procedures Procedures    Medications Ordered in ED Medications  prochlorperazine (COMPAZINE) injection 10 mg (10 mg Intravenous Given 02/12/22 2012)  ketorolac (TORADOL) 15 MG/ML injection 15 mg (15 mg Intravenous Given 02/12/22 2013)  dexamethasone (DECADRON) injection 10 mg (10 mg Intravenous Given 02/12/22 2013)    ED Course/ Medical Decision Making/ A&P Clinical Course as of 02/12/22 2158  Fri Feb 12, 2022  2140 Pregnancy, urine [JR]    Clinical Course User Index [JR] Gareth Eagle, PA-C                           Medical Decision Making Amount and/or Complexity of Data Reviewed Labs: ordered. Decision-making details documented in ED Course. Radiology: ordered.  Risk Prescription drug management.  This patient presents to the ED for concern of chest pain and headache, this involves a number of treatment options, and is a complaint that carries with it a high risk of complications and morbidity.  The differential diagnosis includes ACS, PE, aortic dissection, SAH, CVA, and meningitis.   Co morbidities: Discussed in HPI   EMR reviewed including pt PMHx, past surgical history and past visits to ER.   See HPI for  more details   Lab Tests:   I ordered and independently interpreted labs. Labs notable for hypocalcemia   Imaging Studies:  NAD. I personally reviewed all imaging studies and no acute abnormality found. I agree with radiology interpretation.    Cardiac Monitoring:  The patient was maintained on a cardiac monitor.  I personally viewed and interpreted the cardiac monitored which showed an underlying rhythm of: NSR EKG non-ischemic   Medicines ordered:  I ordered medication including Decadron, Toradol, Compazine for headache. Reevaluation of the patient after these medicines showed that the patient improved I have reviewed the patients home medicines and have made adjustments as needed    Consults/Attending Physician   I discussed this case with my attending physician who cosigned this note including patient's presenting symptoms, physical exam, and planned diagnostics and interventions. Attending physician stated agreement with plan or made changes to plan which were implemented.   Reevaluation:  After the interventions noted above I re-evaluated patient and found that they have :improved      Problem List / ED Course: Presented for chest pain and headache.  In setting of her diastolic dysfunction and history of CAD thorough work-up was warranted.  EKG was unremarkable and troponins were well within normal limits.  Patient stated that chest pain had much improved since her initial presentation to the ED.  In terms of her headache did consider SAH and CVA but unlikely given no FND.  Also headache improved with headache cocktail.  Did advise patient to follow-up  with her cardiologist given work-up today.  Also discussed return precautions.   Dispostion:  After consideration of the diagnostic results and the patients response to treatment, I feel that the patent would benefit from discharge and follow-up with cardiologist regarding chest pain and follow-up with PCP  regarding ongoing headaches.         Final Clinical Impression(s) / ED Diagnoses Final diagnoses:  Chest pain, unspecified type  Nonintractable headache, unspecified chronicity pattern, unspecified headache type    Rx / DC Orders ED Discharge Orders     None         Gareth Eagle, PA-C 02/12/22 2201    Benjiman Core, MD 02/15/22 1447

## 2022-02-12 NOTE — Discharge Instructions (Addendum)
For your chest pain, overall work-up was reassuring.  EKG was normal.  Cardiac enzymes were also within normal limits.  However I would strongly advised that you follow-up with your cardiologist given that you do have CAD and diastolic dysfunction.  If you have worsening chest pain, worsening shortness of breath, feel like you want to pass out please return to the ED for further evaluation.  Regarding your headache, I am encouraged that your symptoms are much improved after headache cocktail.  Would advise that you follow-up with your PCP regarding your symptoms.  If you have new worsening visual disturbance, facial droop, changes in speech or thoughts or loss of range of motion or sensation in your extremities please return to the emergency department for further evaluation.

## 2022-02-12 NOTE — ED Triage Notes (Signed)
Centralized chest pain radiating into R arm x "a few hours". States the pain is squeezing.

## 2022-02-18 ENCOUNTER — Other Ambulatory Visit: Payer: Self-pay | Admitting: Rheumatology

## 2022-02-23 ENCOUNTER — Other Ambulatory Visit: Payer: Self-pay | Admitting: Rheumatology

## 2022-02-23 NOTE — Telephone Encounter (Signed)
Next Visit: 07/09/2022  Last Visit: 01/27/2022  Last Fill: 08/12/2021  DX: Rheumatoid arthritis involving multiple sites with positive rheumatoid factor   Current Dose per office note 01/27/2022: Rasuvo 25 mg per 0.5 mL subcutaneous weekly  Labs: 02/12/2022 Calcium 8.8  Okay to refill Rasuvo?

## 2022-02-24 ENCOUNTER — Ambulatory Visit (HOSPITAL_COMMUNITY)
Admission: RE | Admit: 2022-02-24 | Discharge: 2022-02-24 | Disposition: A | Discharge status: Home / Self Care | Payer: BC Managed Care – PPO | Source: Ambulatory Visit | Attending: Rheumatology | Admitting: Rheumatology

## 2022-02-24 DIAGNOSIS — M0579 Rheumatoid arthritis with rheumatoid factor of multiple sites without organ or systems involvement: Secondary | ICD-10-CM | POA: Insufficient documentation

## 2022-02-24 MED ORDER — DIPHENHYDRAMINE HCL 25 MG PO CAPS
25.0000 mg | ORAL_CAPSULE | ORAL | Status: DC
  Administered 2022-02-24: 25 mg via ORAL

## 2022-02-24 MED ORDER — ACETAMINOPHEN 325 MG PO TABS
ORAL_TABLET | ORAL | Status: AC
  Filled 2022-02-24: qty 2

## 2022-02-24 MED ORDER — ACETAMINOPHEN 325 MG PO TABS
650.0000 mg | ORAL_TABLET | ORAL | Status: DC
  Administered 2022-02-24: 650 mg via ORAL

## 2022-02-24 MED ORDER — SODIUM CHLORIDE 0.9 % IV SOLN
1000.0000 mg | INTRAVENOUS | Status: DC
  Administered 2022-02-24: 1000 mg via INTRAVENOUS
  Filled 2022-02-24: qty 40

## 2022-02-24 MED ORDER — DIPHENHYDRAMINE HCL 25 MG PO CAPS
ORAL_CAPSULE | ORAL | Status: AC
  Filled 2022-02-24: qty 1

## 2022-03-13 ENCOUNTER — Other Ambulatory Visit: Payer: Self-pay | Admitting: Cardiology

## 2022-03-24 ENCOUNTER — Inpatient Hospital Stay (HOSPITAL_COMMUNITY): Admission: RE | Admit: 2022-03-24 | Payer: BC Managed Care – PPO | Source: Ambulatory Visit

## 2022-03-25 ENCOUNTER — Ambulatory Visit (HOSPITAL_COMMUNITY)
Admission: RE | Admit: 2022-03-25 | Discharge: 2022-03-25 | Disposition: A | Discharge status: Home / Self Care | Payer: BC Managed Care – PPO | Source: Ambulatory Visit | Attending: Rheumatology | Admitting: Rheumatology

## 2022-03-25 VITALS — BP 131/74 | HR 77 | Wt 264.0 lb

## 2022-03-25 DIAGNOSIS — M069 Rheumatoid arthritis, unspecified: Secondary | ICD-10-CM | POA: Diagnosis present

## 2022-03-25 DIAGNOSIS — Z79899 Other long term (current) drug therapy: Secondary | ICD-10-CM | POA: Diagnosis present

## 2022-03-25 DIAGNOSIS — M0579 Rheumatoid arthritis with rheumatoid factor of multiple sites without organ or systems involvement: Secondary | ICD-10-CM | POA: Diagnosis present

## 2022-03-25 LAB — COMPREHENSIVE METABOLIC PANEL
ALT: 18 U/L (ref 0–44)
AST: 24 U/L (ref 15–41)
Albumin: 3.5 g/dL (ref 3.5–5.0)
Alkaline Phosphatase: 72 U/L (ref 38–126)
Anion gap: 7 (ref 5–15)
BUN: 10 mg/dL (ref 6–20)
CO2: 26 mmol/L (ref 22–32)
Calcium: 9.3 mg/dL (ref 8.9–10.3)
Chloride: 111 mmol/L (ref 98–111)
Creatinine, Ser: 0.91 mg/dL (ref 0.44–1.00)
GFR, Estimated: >60 mL/min (ref >60)
Glucose, Bld: 81 mg/dL (ref 70–99)
Potassium: 3.7 mmol/L (ref 3.5–5.1)
Sodium: 144 mmol/L (ref 135–145)
Total Bilirubin: 0.2 mg/dL — ABNORMAL LOW (ref 0.3–1.2)
Total Protein: 6.9 g/dL (ref 6.5–8.1)

## 2022-03-25 LAB — CBC WITH DIFFERENTIAL/PLATELET
Abs Immature Granulocytes: 0.04 10*3/uL (ref 0.00–0.07)
Basophils Absolute: 0.1 10*3/uL (ref 0.0–0.1)
Basophils Relative: 1 %
Eosinophils Absolute: 0.2 10*3/uL (ref 0.0–0.5)
Eosinophils Relative: 3 %
HCT: 45.1 % (ref 36.0–46.0)
Hemoglobin: 14.3 g/dL (ref 12.0–15.0)
Immature Granulocytes: 0 %
Lymphocytes Relative: 46 %
Lymphs Abs: 4.3 10*3/uL — ABNORMAL HIGH (ref 0.7–4.0)
MCH: 26.8 pg (ref 26.0–34.0)
MCHC: 31.7 g/dL (ref 30.0–36.0)
MCV: 84.6 fL (ref 80.0–100.0)
Monocytes Absolute: 0.5 10*3/uL (ref 0.1–1.0)
Monocytes Relative: 5 %
Neutro Abs: 4.2 10*3/uL (ref 1.7–7.7)
Neutrophils Relative %: 45 %
Platelets: 297 10*3/uL (ref 150–400)
RBC: 5.33 MIL/uL — ABNORMAL HIGH (ref 3.87–5.11)
RDW: 12.8 % (ref 11.5–15.5)
WBC: 9.3 10*3/uL (ref 4.0–10.5)
nRBC: 0 % (ref 0.0–0.2)

## 2022-03-25 MED ORDER — ACETAMINOPHEN 325 MG PO TABS
650.0000 mg | ORAL_TABLET | ORAL | Status: DC
  Administered 2022-03-25: 650 mg via ORAL

## 2022-03-25 MED ORDER — DIPHENHYDRAMINE HCL 25 MG PO CAPS
25.0000 mg | ORAL_CAPSULE | ORAL | Status: DC
  Administered 2022-03-25: 25 mg via ORAL

## 2022-03-25 MED ORDER — ACETAMINOPHEN 325 MG PO TABS
ORAL_TABLET | ORAL | Status: AC
  Filled 2022-03-25: qty 2

## 2022-03-25 MED ORDER — DIPHENHYDRAMINE HCL 25 MG PO CAPS
ORAL_CAPSULE | ORAL | Status: AC
  Filled 2022-03-25: qty 1

## 2022-03-25 MED ORDER — SODIUM CHLORIDE 0.9 % IV SOLN
1000.0000 mg | INTRAVENOUS | Status: DC
  Administered 2022-03-25: 1000 mg via INTRAVENOUS
  Filled 2022-03-25: qty 40

## 2022-03-25 NOTE — Progress Notes (Signed)
CBC stable.  We will continue to monitor.

## 2022-03-25 NOTE — Progress Notes (Signed)
Total bilirubin borderline low. Rest of CMP WNL.

## 2022-03-26 ENCOUNTER — Ambulatory Visit (HOSPITAL_COMMUNITY)
Admission: EM | Admit: 2022-03-26 | Discharge: 2022-03-26 | Disposition: A | Discharge status: Home / Self Care | Payer: BC Managed Care – PPO | Attending: Emergency Medicine | Admitting: Emergency Medicine

## 2022-03-26 ENCOUNTER — Telehealth: Payer: BC Managed Care – PPO | Admitting: Family Medicine

## 2022-03-26 DIAGNOSIS — T300 Burn of unspecified body region, unspecified degree: Secondary | ICD-10-CM

## 2022-03-26 DIAGNOSIS — T23161A Burn of first degree of back of right hand, initial encounter: Secondary | ICD-10-CM

## 2022-03-26 MED ORDER — ACETAMINOPHEN 325 MG PO TABS
ORAL_TABLET | ORAL | Status: AC
  Filled 2022-03-26: qty 2

## 2022-03-26 MED ORDER — ACETAMINOPHEN 325 MG PO TABS
650.0000 mg | ORAL_TABLET | Freq: Once | ORAL | Status: AC
  Administered 2022-03-26: 650 mg via ORAL

## 2022-03-26 MED ORDER — MUPIROCIN 2 % EX OINT: 1.0000 | TOPICAL_OINTMENT | Freq: Two times a day (BID) | CUTANEOUS | 0 refills | Status: AC

## 2022-03-26 NOTE — Progress Notes (Signed)
E-VISIT for Burn  We are sorry that you are not feeling well. Here is how we plan to help!  Based on what you have shared with me it looks like you may have:  I have prescribed Mupirocin 2% ointment.  Apply with gloves to the affected area two times per day for 14 days.  Apply a non-stick dressing such as Telfa to the site after you apply the cream.  You may hold the dressing in place with either paper tape or self adhering wrap such as Coban.  Product similar to Telfa and Coban can be bought at any pharmacy.  If you have a question ask your pharmacist.  Quay Burow are a type of painful wound caused by thermal, electrical, chemical, or electromagnetic energy.  Smoking and open flame are the leading cause of burn injury for older adults.  Scalding from a hot liquid is the leading cause of burn injury for children.  Both infants and older adults are the greatest risk for burn injury.  First degree burns effect only the outer layers of the skin.  The burn may be red and painful but the skin does not blister.  Long term tissue damage is rare.  Second degree burns involve the surface of the skin and the adjacent skin layers.  The burn sire also appears red and painful and the skin often swells and/or blisters.  Third degree burns destroy both layers of the skin and can also penetrate to underlying  Structures.  A third degree burn may not initially hurt because nerve endings were destroyed.  All third degree burns should be evaluated in person.  HOME CARE:  Wash the area gently with soap and water once a day Apply antibiotic ointment directly to a Band-Aid or dressing and apply Band-Aid or dressing over the burn.  Change dressing every other day.  Use warm water and 1 or 2 wipes with a wet washcloth to remove any surface debris.  Some of the newer antibiotic ointments contain lidocaine that can help to control the localized pain of the burn. You should leave intact blisters alone for the first 7 days.   After a week you may gently remove blisters.  The easiest way to do this is gently wipe away the dead skin with wet gauze or wet washcloth. If that fails you may carefully trim off the dead skin with a pair of fine scissors.  Be sure to clean the scissors in alcohol before use.  GET HELP RIGHT AWAY IF:  The area of the burn is larger than 4 palms of our hand. You become short of breath. The site looks infected. Your symptoms persist after you have completed your treatment plan. The burn has not healed in 14 days.   MAKE SURE YOU:  Understand these instructions. Will watch your condition. Will get help right away if you are not doing well or get worse.  Your e-visit answers were reviewed by a board certified advanced clinical practitioner to complete your personal care plan.  Depending upon the condition, your plan could have included both over the counter or prescription medications.    Please review your pharmacy choice.  Make sure the pharmacy is open so you can pick up prescription now.   If there is a problem, you may contact your provider through [employer] and have the prescription routed to another pharmacy. Your safety is important to [place].  If you have drug allergies check your prescription carefully.    For the  next 24 hours you can use MyChart to ask questions about today's visit, request a non-urgent call back, or ask for a work or school excuse.  You will get an email with a link to our survey asking about your experience.  I hope that your e-visit has been valuable and will speed your recovery.  I have provided 5 minutes of non face to face time during this encounter for chart review and documentation.       Thank you for using e-Visits.

## 2022-03-26 NOTE — ED Provider Notes (Signed)
[facility]    CSN: [id number] Arrival date & time: 03/26/22  1921     History   Chief Complaint Chief Complaint  Patient presents with   Burn    HPI [name] [name] is a 56 y.o. female.  Presents after a burn that occurred today Right thumb burned with bacon grease Reports 7/10 pain  She ran it under cool water when it occurred  Patient reports a high pain tolerance and requests narcotic medicine for pain   On chart review she had an e-visit about 30 minutes prior to presentation. They prescribed Bactroban  Past Medical History:  Diagnosis Date   Anxiety    Bursitis    CAD (coronary artery disease), native coronary artery    Coronary CTA showed minimal calcified plaque in the proximal and mid LAD less than 25%, mild left atrial enlargement and a coronary calcium score to 73 which is 99th percentile for age and sex matched controls by coronary CTA 10-2021   Depression    Diastolic dysfunction    Dyslipidemia 01/30/2015   Esophageal ring    Fibromyalgia    Hiatal hernia    HSV-1 infection    Morbid obesity (HCC)    Osteoarthritis    Rheumatoid arthritis(714.0)    M05.79   Rheumatoid arthritis, seropositive, 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HCC)    Treated with Orencia, TB neg 09/12/2015   Spondylolysis     Patient Active Problem List   Diagnosis Date Noted   Amenorrhea 06/09/2020   Anxiety 02/20/2018   Fatigue 02/20/2018   Low blood potassium 02/20/2018   Allergic rhinitis 12/14/2017   Fever blister 12/14/2017   Head congestion 12/14/2017   Hyperlipidemia 12/14/2017   Lateral epicondylitis of right elbow 05/04/2017   Sacral pain 03/23/2017   High risk medication use 08/03/2016   Fibromyalgia syndrome 08/03/2016   Abnormal cardiac CT angiography    Sacrococcygeal pain 02/20/2016   Rheumatoid arthritis involving 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HCC) 01/24/2016   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   Dyslipidemia 01/30/2015   MDD (major depressive disorder), recurrent  severe, 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   Essential hypertension 08/25/2014   Morbid obesity (HCC) 08/25/2014   CAD (coronary artery disease), native coronary artery 08/24/2014   Numbness and tingling in left arm    Arm numbness left 08/22/2014   Chest pain 08/22/2014   Gastroesophageal reflux disease without esophagitis 07/23/2014   Coronary artery calcification seen on CAT scan 02/19/2014   Hip pain 10/23/2013   Solitary pulmonary nodule 08/28/2013   Chest pain, atypical 07/18/2013   Heart palpitations 06/27/2013   Cough 06/04/2013   Diastolic dysfunction 05/18/2013   SOB (shortness of breath) 05/09/2013   Other malaise and fatigue 01/21/2013   Stress and adjustment reaction 12/09/2012   Right sided abdominal pain 11/29/2012   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/21/2012   Morbid obesity with BMI of 50.0-59.9, adult (HCC) 11/21/2012   Arthritis 07/13/2012   Depression 07/13/2012   Routine health maintenance 05/07/2012   Migraine 06/07/1998    Past Surgical History:  Procedure Laterality Date   CARDIAC CATHETERIZATION N/A 06/11/2016   Procedure: Left Heart Cath and Coronary Angiography;  Surgeon: Corky Crafts, MD;  Location: [facility] INVASIVE CV LAB;  Service: Cardiovascular;  Laterality: N/A;   CESAREAN SECTION     COLONOSCOPY  07/27/2017   ESOPHAGEAL MANOMETRY N/A 10/28/2014   Procedure: ESOPHAGEAL MANOMETRY (EM);  Surgeon: Hilarie Fredrickson, MD;  Location: [facility] ENDOSCOPY;  Service: Endoscopy;  Laterality: N/A;   HERNIA REPAIR  reports surgery on 3 hernias, with 2 more present   LAPAROSCOPIC GASTRIC SLEEVE RESECTION  11/21/12   [facility]   LEFT HEART CATHETERIZATION WITH CORONARY ANGIOGRAM N/A 08/26/2014   Procedure: LEFT HEART CATHETERIZATION WITH CORONARY ANGIOGRAM;  Surgeon: Runell Gess, MD;  Location: [facility] CATH LAB;  Service: Cardiovascular;  Laterality: N/A;   TUBAL LIGATION      OB History   No obstetric history on file.      Home Medications     Prior to Admission medications   Medication Sig Start Date End Date Taking? Authorizing Provider  Abatacept (ORENCIA IV) Inject 1,000 mg into the vein every 28 (twenty-eight) days.    Pollyann Savoy, MD  Abatacept (ORENCIA IV) 1,000 mLs every 28 (twenty-eight) days.    [provider]  acetaminophen-codeine (TYLENOL #3) 300-30 MG tablet Take 1 tablet by mouth every 6 (six) hours as needed for moderate pain. Patient not taking: Reported on 01/27/2022 09/21/21   Kirtland Bouchard, PA-C  acyclovir ointment (ZOVIRAX) 5 % as needed.    [provider]  Acyclovir-Hydrocortisone (XERESE) 5-1 % CREA as needed.    [provider]  Adapalene 0.3 % gel Apply 1 application topically daily as needed (acne).  12/31/14   [provider]  Alcohol Swabs (ALCOHOL WIPES) 70 % PADS Alcohol Prep Pads    [provider]  ARIPiprazole (ABILIFY) 2 MG tablet Take 2 mg by mouth daily. 09/25/21   [provider]  aspirin EC 81 MG tablet Take 1 tablet (81 mg total) by mouth daily. Swallow whole. 10/13/21   Quintella Reichert, MD  azelastine (ASTELIN) 0.1 % nasal spray Place 2 sprays into both nostrils 2 (two) times daily. Patient taking differently: Place 2 sprays into both nostrils as needed. 01/12/20   Cathie Hoops, Amy V, PA-C  benzonatate (TESSALON) 100 MG capsule Take 1 capsule (100 mg total) by mouth 2 (two) times daily as needed for cough. 04/15/21   Freddy Finner, NP  betamethasone, augmented, (DIPROLENE) 0.05 % lotion Apply topically as needed. 01/23/22   [provider]  buPROPion (WELLBUTRIN XL) 150 MG 24 hr tablet Take 450 mg by mouth every morning. 05/23/19   [provider]  buPROPion (WELLBUTRIN XL) 150 MG 24 hr tablet Take 3 tablets by mouth every morning.    [provider]  cetirizine (ZYRTEC ALLERGY) 10 MG tablet Take 1 tablet (10 mg total) by mouth daily. 10/08/21   Wallis Bamberg, PA-C  Cetirizine HCl (ZYRTEC ALLERGY PO) 10 mg as needed.     [provider]  cetirizine-pseudoephedrine (ZYRTEC-D) 5-120 MG tablet Take 1 tablet by mouth 2 (two) times daily. Patient not taking: Reported on 01/27/2022 07/03/21   Bennie Pierini, FNP  ciclopirox (LOPROX) 0.77 % cream as needed.    [provider]  Ciclopirox 1 % shampoo Apply topically as needed. 01/25/22   [provider]  cyclobenzaprine (FLEXERIL) 5 MG tablet Take 1 tablet (5 mg total) by mouth at bedtime as needed. 01/27/22   Gearldine Bienenstock, PA-C  diclofenac Sodium (VOLTAREN) 1 % GEL Apply 2-4 grams to affected joint 4 times daily as needed. 01/27/22   Gearldine Bienenstock, PA-C  diltiazem (CARDIZEM CD) 180 MG 24 hr capsule Take 1 capsule (180 mg total) by mouth daily. 12/15/21   Quintella Reichert, MD  doxepin (SINEQUAN) 25 MG capsule Take 25 mg by mouth at bedtime as needed. 04/28/19   [provider]  EPINEPHrine 0.3 mg/0.3 mL IJ  SOAJ injection  07/02/19   [provider]  estradiol (ESTRACE) 0.5 MG tablet Take 0.5 mg by mouth daily. 09/16/21   [provider]  ezetimibe (ZETIA) 10 MG tablet Take 1 tablet (10 mg total) by mouth daily. 12/15/21   Quintella Reichert, MD  famciclovir (FAMVIR) 500 MG tablet Take 15,000 mg by mouth daily as needed (fever blister).  01/21/15   [provider]  famotidine (PEPCID) 20 MG tablet Take 20 mg by mouth as needed. 09/22/21   [provider]  famotidine (PEPCID) 20 MG tablet daily.    [provider]  fluticasone (FLONASE) 50 MCG/ACT nasal spray Place 1 spray into both nostrils daily for 3 days. 11/11/21 11/14/21  Gustavus Bryant, FNP  fluticasone (FLONASE) 50 MCG/ACT nasal spray as needed.    [provider]  Folic Acid-Vit B6-Vit B12 (FABB) 2.2-25-1 MG TABS Take 1 tablet by mouth daily. 01/27/22   Gearldine Bienenstock, PA-C  gabapentin (NEURONTIN) 300 MG capsule TAKE ONE CAPSULE BY MOUTH THREE TIMES A DAY AS NEEDED FOR PAIN 01/27/22   Gearldine Bienenstock, PA-C  hydrocortisone 2.5 % cream  SMARTSIG:1 Topical Every Night 11/06/19   [provider]  hydrOXYzine (VISTARIL) 25 MG capsule Take 25 mg by mouth as needed. 10/04/21   [provider]  ketoconazole (NIZORAL) 2 % cream  03/14/20   [provider]  ketoconazole (NIZORAL) 2 % shampoo  06/03/20   [provider]  levothyroxine (SYNTHROID) 25 MCG tablet as needed.    [provider]  lidocaine (LIDODERM) 5 % Apply 1 patch topically as needed.    [provider]  lidocaine (LIDODERM) 5 % APPLY 1 PATCH TO AFFECTED AREA FOR 12 HOURS IN A 24 HOUR PERIOD 02/04/22   Gearldine Bienenstock, PA-C  LINZESS 145 MCG CAPS capsule Take 145 mcg by mouth daily. 01/26/22   [provider]  LORazepam (ATIVAN) 1 MG tablet Take 1 tablet (1 mg total) by mouth 3 (three) times daily as needed for anxiety. 10/09/19   Moshe Cipro, NP  metoprolol tartrate (LOPRESSOR) 100 MG tablet Take 1 tablet (100 mg total) by mouth once for 1 dose. Take 1 tablet (100 mg total) two hours prior to CT scan. 10/13/21 10/13/21  Quintella Reichert, MD  mupirocin ointment (BACTROBAN) 2 % Apply 1 Application topically 2 (two) times daily for 10 days. 03/26/22 04/05/22  Delorse Lek, FNP  neomycin-polymyxin-hydrocortisone (CORTISPORIN) 3.5-10000-1 OTIC suspension as needed.    [provider]  nitroGLYCERIN (NITROSTAT) 0.4 MG SL tablet PLACE 1 TABLET UNDER THE TONGUE EVERY 5 MINUTES AS NEEDED FOR CHEST PAIN. 03/15/22   Quintella Reichert, MD  [facility] powder Apply topically. 03/17/20   [provider]  nystatin cream (MYCOSTATIN) Apply topically. 01/23/22   [provider]  Oxymetazoline HCl (NASAL SPRAY) 0.05 % SOLN Nasal Decongestant (oxymetazoline) 0.05 % spray    [provider]  predniSONE (DELTASONE) 20 MG tablet Take 2 tablets daily with breakfast. Patient not taking: Reported on 01/27/2022 10/08/21   Wallis Bamberg, PA-C  progesterone (PROMETRIUM) 100 MG capsule Take 100 mg by mouth daily. 09/16/21    [provider]  PROGESTERONE PO 100 mg daily.    [provider]  promethazine-dextromethorphan (PROMETHAZINE-DM) 6.25-15 MG/5ML syrup Take 2.5 mLs by mouth 4 (four) times daily as needed for cough. 10/08/21   Wallis Bamberg, PA-C  propranolol (INDERAL) 10 MG tablet Take 10 mg by mouth daily.    [provider]  pseudoephedrine (SUDAFED) 60 MG tablet Take 1 tablet (60 mg total) by mouth every 8 (eight) hours as needed for congestion. 10/08/21   Jaynee Eagles, PA-C  RASUVO 25 MG/0.5ML SOAJ INJECT ONE PEN SUBCUTANEOUSLY ONCE EVERY WEEK. STORE AT ROOM TEMPERATURE BETWEEN 68 - 77 DEGREES F. 02/23/22   Bo Merino, MD  rizatriptan (MAXALT) 10 MG tablet Take 1 tablet (10 mg total) by mouth as needed for migraine. May repeat in 2 hours if needed Patient not taking: Reported on 01/27/2022 04/15/21   Perlie Mayo, NP  rosuvastatin (CRESTOR) 40 MG tablet Take 1 tablet (40 mg total) by mouth daily. 12/15/21   Sueanne Margarita, MD  triamcinolone (KENALOG) 0.1 % paste Use as directed 1 application. in the mouth or throat 2 (two) times daily. Apply to mouth sores 11/11/21   Teodora Medici, FNP  Triamcinolone Acetonide 0.025 % LOTN as needed.    [provider]  valACYclovir (VALTREX) 500 MG tablet Take 500 mg by mouth as needed. 09/17/21   [provider]  Vitamin D, Ergocalciferol, (DRISDOL) 1.25 MG (50000 UT) CAPS capsule Take 50,000 Units by mouth See admin instructions. Take 1 tablet (50000 units) by mouth every Monday, Wednesday, Saturday    [provider]    Family History Family History  Problem Relation Age of Onset   Hyperlipidemia Mother    Depression Mother    Sarcoidosis Father    Lung disease Father        Pleural Mesothelioma   Cancer Father    Heart attack Paternal Grandmother    Hypertension Paternal Grandmother    Hypertension Maternal Grandmother    Diabetes Maternal Grandmother    Stroke Neg Hx    Colon cancer Neg Hx    Esophageal  cancer Neg Hx    Rectal cancer Neg Hx    Stomach cancer Neg Hx     Social History Social History   Tobacco Use   Smoking status: Never    Passive exposure: Never   Smokeless tobacco: Never  Vaping Use   Vaping Use: Never used  Substance Use Topics   Alcohol use: No   Drug use: No     Allergies   Influenza vaccines, Influenza virus vaccine, Amitriptyline, and Isosorbide   Review of Systems Review of Systems   Physical Exam Triage Vital Signs ED Triage Vitals [03/26/22 1931]  Enc Vitals Group     BP (!) 152/112     Pulse Rate 90     Resp 18     Temp 97.7 F (36.5 C)     Temp Source Oral     SpO2 98 %     Weight      Height      Head Circumference      Peak Flow      Pain Score 7     Pain Loc      Pain Edu?      Excl. in [place]?    No data found.  Updated Vital Signs BP (!) 152/112 (BP Location: Left Arm)   Pulse 90   Temp 97.7 F (36.5 C) (Oral)   Resp 18   SpO2 98%     Physical Exam Vitals and nursing note reviewed.  Constitutional:      General: She is not in acute distress. Cardiovascular:     Rate and Rhythm: Normal rate and regular rhythm.  Pulmonary:     Effort: Pulmonary effort is normal.  Musculoskeletal:  General: No swelling. Normal range of motion.  Skin:    Capillary Refill: Capillary refill takes less than 2 seconds.     Findings: Burn present.     Comments: There is a small, about 2 inch long area of superficial burn on the right dorsal thumb.  No blistering noted  Neurological:     Mental Status: She is alert and oriented to person, place, and time.     UC Treatments / Results  Labs (all labs ordered are listed, but only abnormal results are displayed) Labs Reviewed - No data to display  EKG  Radiology No results found.  Procedures Procedures  Medications Ordered in UC Medications  acetaminophen (TYLENOL) tablet 650 mg (650 mg Oral Given 03/26/22 2012)    Initial Impression / Assessment and Plan / UC Course   I have reviewed the triage vital signs and the nursing notes.  Pertinent labs & imaging results that were available during my care of the patient were reviewed by me and considered in my medical decision making (see chart for details).  Very superficial burn. 1st degree Discussed that narcotics are not warranted for this. Tylenol dose given in clinic  Recommend tylenol as needed for pain over the next few days. Discussed applying topical moisturizer, can do aloe vera if preferred. Discussed the Bactroban applied via e-visit can be used if she wants to but it's not likely to do much different than a regular moisturizer.  Return precautions discussed. Patient agrees to plan  Final Clinical Impressions(s) / UC Diagnoses   Final diagnoses:  Superficial burn of back of right hand, initial encounter     Discharge Instructions      Please use Tylenol as needed to control pain.  The burn should heal over the next few days.  You can apply a topical moisturizer or aloe as needed.     ED Prescriptions   None    I have reviewed the PDMP during this encounter.   Hernan Turnage, Lurena Joiner, [place] 03/26/22 2015

## 2022-03-26 NOTE — ED Triage Notes (Signed)
Pt states burnt her rt thumb with bacon grease. Redness noted.

## 2022-03-26 NOTE — Discharge Instructions (Addendum)
Please use Tylenol as needed to control pain.  The burn should heal over the next few days.  You can apply a topical moisturizer or aloe as needed.

## 2022-04-08 ENCOUNTER — Other Ambulatory Visit: Payer: Self-pay | Admitting: Rheumatology

## 2022-04-09 NOTE — Telephone Encounter (Signed)
Next Visit: 07/09/2022   Last Visit: 01/27/2022   Last Fill: 01/27/2022  Current Dose per office note 01/27/2022: dose not discussed  Dx: Fibromyalgia   Okay to refill Gabapentin?

## 2022-04-21 ENCOUNTER — Encounter (HOSPITAL_COMMUNITY): Payer: BC Managed Care – PPO

## 2022-04-22 ENCOUNTER — Other Ambulatory Visit: Payer: Self-pay | Admitting: Pharmacist

## 2022-04-22 ENCOUNTER — Ambulatory Visit (HOSPITAL_COMMUNITY)
Admission: RE | Admit: 2022-04-22 | Discharge: 2022-04-22 | Disposition: A | Discharge status: Home / Self Care | Payer: BC Managed Care – PPO | Source: Ambulatory Visit | Attending: Rheumatology | Admitting: Rheumatology

## 2022-04-22 DIAGNOSIS — Z79899 Other long term (current) drug therapy: Secondary | ICD-10-CM

## 2022-04-22 DIAGNOSIS — M0579 Rheumatoid arthritis with rheumatoid factor of multiple sites without organ or systems involvement: Secondary | ICD-10-CM | POA: Insufficient documentation

## 2022-04-22 MED ORDER — DIPHENHYDRAMINE HCL 25 MG PO CAPS
25.0000 mg | ORAL_CAPSULE | ORAL | Status: DC
  Administered 2022-04-22: 25 mg via ORAL

## 2022-04-22 MED ORDER — ACETAMINOPHEN 325 MG PO TABS
ORAL_TABLET | ORAL | Status: AC
  Filled 2022-04-22: qty 2

## 2022-04-22 MED ORDER — DIPHENHYDRAMINE HCL 25 MG PO CAPS
ORAL_CAPSULE | ORAL | Status: AC
  Filled 2022-04-22: qty 1

## 2022-04-22 MED ORDER — ACETAMINOPHEN 325 MG PO TABS
650.0000 mg | ORAL_TABLET | ORAL | Status: DC
  Administered 2022-04-22: 650 mg via ORAL

## 2022-04-22 MED ORDER — SODIUM CHLORIDE 0.9 % IV SOLN
1000.0000 mg | INTRAVENOUS | Status: DC
  Administered 2022-04-22: 1000 mg via INTRAVENOUS
  Filled 2022-04-22: qty 40

## 2022-04-22 NOTE — Progress Notes (Signed)
Next infusion scheduled for Orencia IV on 05/20/22 and due for updated orders. Takes with Rasuvo Diagnosis: RA  Dose: 1000mg  every 4 weeks (appropriate based on last recorded weight of 119.7kg)  Last Clinic Visit: 01/27/22 Next Clinic Visit: 07/09/22  Last infusion: 04/22/22  Labs: CBC and CMP on 03/25/22 TB Gold: negative on 08/12/21   Orders placed for Orencia IV x 1 doses along with premedication of acetaminophen and diphenhydramine to be administered 30 minutes before medication infusion. Precertification renewal is required (06/09/2021 to 06/08/2022 )  Standing CBC with diff/platelet and CMP with GFR orders placed to be drawn every 2 months.  Next TB gold due 08/13/22  10/13/22, PharmD, MPH, BCPS, CPP Clinical Pharmacist (Rheumatology and Pulmonology)

## 2022-04-26 ENCOUNTER — Ambulatory Visit
Admission: EM | Admit: 2022-04-26 | Discharge: 2022-04-26 | Disposition: A | Discharge status: Home / Self Care | Payer: BC Managed Care – PPO | Attending: Physician Assistant | Admitting: Physician Assistant

## 2022-04-26 DIAGNOSIS — Z1152 Encounter for screening for COVID-19: Secondary | ICD-10-CM | POA: Insufficient documentation

## 2022-04-26 DIAGNOSIS — J069 Acute upper respiratory infection, unspecified: Secondary | ICD-10-CM | POA: Insufficient documentation

## 2022-04-26 LAB — RESP PANEL BY RT-PCR (FLU A&B, COVID) ARPGX2
Influenza A by PCR: NEGATIVE
Influenza B by PCR: NEGATIVE
SARS Coronavirus 2 by RT PCR: NEGATIVE

## 2022-04-26 MED ORDER — TRIAMCINOLONE ACETONIDE 40 MG/ML IJ SUSP
40.0000 mg | Freq: Once | INTRAMUSCULAR | Status: AC
  Administered 2022-04-26: 40 mg via INTRAMUSCULAR

## 2022-04-26 NOTE — ED Provider Notes (Signed)
[facility]    CSN: [id number] Arrival date & time: 04/26/22  2694      History   Chief Complaint Chief Complaint  Patient presents with   Cough    HPI BERTHE OLEY is a 56 y.o. female.   Patient here today for evaluation of cough, congestion, sore throat that she has had for the last 3 days.  She denies any vomiting or diarrhea but does note that she has irritable bowel syndrome at baseline so this is difficult to determine.  She has had some chills but no known fever.  The history is provided by the patient.  Cough Associated symptoms: chills and sore throat   Associated symptoms: no ear pain, no eye discharge, no fever, no shortness of breath and no wheezing     Past Medical History:  Diagnosis Date   Anxiety    Bursitis    CAD (coronary artery disease), native coronary artery    Coronary CTA showed minimal calcified plaque in the proximal and mid LAD less than 25%, mild left atrial enlargement and a coronary calcium score to 73 which is 99th percentile for age and sex matched controls by coronary CTA 10-2021   Depression    Diastolic dysfunction    Dyslipidemia 01/30/2015   Esophageal ring    Fibromyalgia    Hiatal hernia    HSV-1 infection    Morbid obesity (HCC)    Osteoarthritis    Rheumatoid arthritis(714.0)    M05.79   Rheumatoid arthritis, seropositive, 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HCC)    Treated with Orencia, TB neg 09/12/2015   Spondylolysis     Patient Active Problem List   Diagnosis Date Noted   Amenorrhea 06/09/2020   Anxiety 02/20/2018   Fatigue 02/20/2018   Low blood potassium 02/20/2018   Allergic rhinitis 12/14/2017   Fever blister 12/14/2017   Head congestion 12/14/2017   Hyperlipidemia 12/14/2017   Lateral epicondylitis of right elbow 05/04/2017   Sacral pain 03/23/2017   High risk medication use 08/03/2016   Fibromyalgia syndrome 08/03/2016   Abnormal cardiac CT angiography    Sacrococcygeal pain 02/20/2016   Rheumatoid  arthritis involving 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HCC) 01/24/2016   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   Dyslipidemia 01/30/2015   MDD (major depressive disorder), recurrent severe, 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   Essential hypertension 08/25/2014   Morbid obesity (HCC) 08/25/2014   CAD (coronary artery disease), native coronary artery 08/24/2014   Numbness and tingling in left arm    Arm numbness left 08/22/2014   Chest pain 08/22/2014   Gastroesophageal reflux disease without esophagitis 07/23/2014   Coronary artery calcification seen on CAT scan 02/19/2014   Hip pain 10/23/2013   Solitary pulmonary nodule 08/28/2013   Chest pain, atypical 07/18/2013   Heart palpitations 06/27/2013   Cough 06/04/2013   Diastolic dysfunction 05/18/2013   SOB (shortness of breath) 05/09/2013   Other malaise and fatigue 01/21/2013   Stress and adjustment reaction 12/09/2012   Right sided abdominal pain 11/29/2012   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/21/2012   Morbid obesity with BMI of 50.0-59.9, adult (HCC) 11/21/2012   Arthritis 07/13/2012   Depression 07/13/2012   Routine health maintenance 05/07/2012   Migraine 06/07/1998    Past Surgical History:  Procedure Laterality Date   CARDIAC CATHETERIZATION N/A 06/11/2016   Procedure: Left Heart Cath and Coronary Angiography;  Surgeon: Corky Crafts, MD;  Location: [facility] INVASIVE CV LAB;  Service: Cardiovascular;  Laterality: N/A;   CESAREAN SECTION  COLONOSCOPY  07/27/2017   ESOPHAGEAL MANOMETRY N/A 10/28/2014   Procedure: ESOPHAGEAL MANOMETRY (EM);  Surgeon: Hilarie Fredrickson, MD;  Location: [facility]OS[facility];  Service: Endoscopy;  Laterality: N/A;   HERNIA REPAIR     reports surgery on 3 hernias, with 2 more present   LAPAROSCOPIC GASTRIC SLEEVE RESECTION  11/21/12   [facility]   LEFT HEART CATHETERIZATION WITH CORONARY ANGIOGRAM N/A 08/26/2014   Procedure: LEFT HEART CATHETERIZATION WITH CORONARY ANGIOGRAM;  Surgeon: Runell Gess, MD;   Location: [facility] CATH LAB;  Service: Cardiovascular;  Laterality: N/A;   TUBAL LIGATION      OB History   No obstetric history on file.      Home Medications    Prior to Admission medications   Medication Sig Start Date End Date Taking? Authorizing Provider  Abatacept (ORENCIA IV) Inject 1,000 mg into the vein every 28 (twenty-eight) days.    Pollyann Savoy, MD  Abatacept (ORENCIA IV) 1,000 mLs every 28 (twenty-eight) days.    [provider]  acetaminophen-codeine (TYLENOL #3) 300-30 MG tablet Take 1 tablet by mouth every 6 (six) hours as needed for moderate pain. Patient not taking: Reported on 01/27/2022 09/21/21   Kirtland Bouchard, PA-C  acyclovir ointment (ZOVIRAX) 5 % as needed.    [provider]  Acyclovir-Hydrocortisone (XERESE) 5-1 % CREA as needed.    [provider]  Adapalene 0.3 % gel Apply 1 application topically daily as needed (acne).  12/31/14   [provider]  Alcohol Swabs (ALCOHOL WIPES) 70 % PADS Alcohol Prep Pads    [provider]  ARIPiprazole (ABILIFY) 2 MG tablet Take 2 mg by mouth daily. 09/25/21   [provider]  aspirin EC 81 MG tablet Take 1 tablet (81 mg total) by mouth daily. Swallow whole. 10/13/21   Quintella Reichert, MD  azelastine (ASTELIN) 0.1 % nasal spray Place 2 sprays into both nostrils 2 (two) times daily. Patient taking differently: Place 2 sprays into both nostrils as needed. 01/12/20   Cathie Hoops, Amy V, PA-C  benzonatate (TESSALON) 100 MG capsule Take 1 capsule (100 mg total) by mouth 2 (two) times daily as needed for cough. 04/15/21   Freddy Finner, NP  betamethasone, augmented, (DIPROLENE) 0.05 % lotion Apply topically as needed. 01/23/22   [provider]  buPROPion (WELLBUTRIN XL) 150 MG 24 hr tablet Take 450 mg by mouth every morning. 05/23/19   [provider]  buPROPion (WELLBUTRIN XL) 150 MG 24 hr tablet Take 3 tablets by mouth every morning.    [provider]   cetirizine (ZYRTEC ALLERGY) 10 MG tablet Take 1 tablet (10 mg total) by mouth daily. 10/08/21   Wallis Bamberg, PA-C  Cetirizine HCl (ZYRTEC ALLERGY PO) 10 mg as needed.    [provider]  cetirizine-pseudoephedrine (ZYRTEC-D) 5-120 MG tablet Take 1 tablet by mouth 2 (two) times daily. Patient not taking: Reported on 01/27/2022 07/03/21   Bennie Pierini, FNP  ciclopirox (LOPROX) 0.77 % cream as needed.    [provider]  Ciclopirox 1 % shampoo Apply topically as needed. 01/25/22   [provider]  cyclobenzaprine (FLEXERIL) 5 MG tablet Take 1 tablet (5 mg total) by mouth at bedtime as needed. 01/27/22   Gearldine Bienenstock, PA-C  diclofenac Sodium (VOLTAREN) 1 % GEL Apply 2-4 grams to affected joint 4 times daily as needed. 01/27/22   Gearldine Bienenstock, PA-C  diltiazem (CARDIZEM CD) 180 MG 24 hr capsule Take 1 capsule (180  mg total) by mouth daily. 12/15/21   Quintella Reichert, MD  doxepin (SINEQUAN) 25 MG capsule Take 25 mg by mouth at bedtime as needed. 04/28/19   [provider]  EPINEPHrine 0.3 mg/0.3 mL IJ SOAJ injection  07/02/19   [provider]  estradiol (ESTRACE) 0.5 MG tablet Take 0.5 mg by mouth daily. 09/16/21   [provider]  ezetimibe (ZETIA) 10 MG tablet Take 1 tablet (10 mg total) by mouth daily. 12/15/21   Quintella Reichert, MD  famciclovir (FAMVIR) 500 MG tablet Take 15,000 mg by mouth daily as needed (fever blister).  01/21/15   [provider]  famotidine (PEPCID) 20 MG tablet Take 20 mg by mouth as needed. 09/22/21   [provider]  famotidine (PEPCID) 20 MG tablet daily.    [provider]  fluticasone (FLONASE) 50 MCG/ACT nasal spray Place 1 spray into both nostrils daily for 3 days. 11/11/21 11/14/21  Gustavus Bryant, FNP  fluticasone (FLONASE) 50 MCG/ACT nasal spray as needed.    [provider]  Folic Acid-Vit B6-Vit B12 (FABB) 2.2-25-1 MG TABS Take 1 tablet by mouth daily. 01/27/22   Gearldine Bienenstock,  PA-C  gabapentin (NEURONTIN) 300 MG capsule TAKE 1 CAPSULE BY MOUTH THREE TIMES A DAY AS NEEDED FOR PAIN 04/09/22   Pollyann Savoy, MD  hydrocortisone 2.5 % cream SMARTSIG:1 Topical Every Night 11/06/19   [provider]  hydrOXYzine (VISTARIL) 25 MG capsule Take 25 mg by mouth as needed. 10/04/21   [provider]  ketoconazole (NIZORAL) 2 % cream  03/14/20   [provider]  ketoconazole (NIZORAL) 2 % shampoo  06/03/20   [provider]  levothyroxine (SYNTHROID) 25 MCG tablet as needed.    [provider]  lidocaine (LIDODERM) 5 % Apply 1 patch topically as needed.    [provider]  lidocaine (LIDODERM) 5 % APPLY 1 PATCH TO AFFECTED AREA FOR 12 HOURS IN A 24 HOUR PERIOD 02/04/22   Gearldine Bienenstock, PA-C  LINZESS 145 MCG CAPS capsule Take 145 mcg by mouth daily. 01/26/22   [provider]  LORazepam (ATIVAN) 1 MG tablet Take 1 tablet (1 mg total) by mouth 3 (three) times daily as needed for anxiety. 10/09/19   Moshe Cipro, NP  metoprolol tartrate (LOPRESSOR) 100 MG tablet Take 1 tablet (100 mg total) by mouth once for 1 dose. Take 1 tablet (100 mg total) two hours prior to CT scan. 10/13/21 10/13/21  Quintella Reichert, MD  neomycin-polymyxin-hydrocortisone (CORTISPORIN) 3.5-10000-1 OTIC suspension as needed.    [provider]  nitroGLYCERIN (NITROSTAT) 0.4 MG SL tablet PLACE 1 TABLET UNDER THE TONGUE EVERY 5 MINUTES AS NEEDED FOR CHEST PAIN. 03/15/22   Quintella Reichert, MD  [facility] powder Apply topically. 03/17/20   [provider]  nystatin cream (MYCOSTATIN) Apply topically. 01/23/22   [provider]  Oxymetazoline HCl (NASAL SPRAY) 0.05 % SOLN Nasal Decongestant (oxymetazoline) 0.05 % spray    [provider]  predniSONE (DELTASONE) 20 MG tablet Take 2 tablets daily with breakfast. Patient not taking: Reported on 01/27/2022 10/08/21   Wallis Bamberg, PA-C  progesterone (PROMETRIUM) 100 MG capsule Take 100  mg by mouth daily. 09/16/21   [provider]  PROGESTERONE PO 100 mg daily.    [provider]  promethazine-dextromethorphan (PROMETHAZINE-DM) 6.25-15 MG/5ML syrup Take 2.5 mLs by mouth 4 (four) times daily as needed for cough. 10/08/21   Wallis Bamberg, PA-C  propranolol (INDERAL) 10  MG tablet Take 10 mg by mouth daily.    [provider]  pseudoephedrine (SUDAFED) 60 MG tablet Take 1 tablet (60 mg total) by mouth every 8 (eight) hours as needed for congestion. 10/08/21   Wallis Bamberg, PA-C  RASUVO 25 MG/0.5ML SOAJ INJECT ONE PEN SUBCUTANEOUSLY ONCE EVERY WEEK. STORE AT ROOM TEMPERATURE BETWEEN 68 - 77 DEGREES F. 02/23/22   Pollyann Savoy, MD  rizatriptan (MAXALT) 10 MG tablet Take 1 tablet (10 mg total) by mouth as needed for migraine. May repeat in 2 hours if needed Patient not taking: Reported on 01/27/2022 04/15/21   Freddy Finner, NP  rosuvastatin (CRESTOR) 40 MG tablet Take 1 tablet (40 mg total) by mouth daily. 12/15/21   Quintella Reichert, MD  triamcinolone (KENALOG) 0.1 % paste Use as directed 1 application. in the mouth or throat 2 (two) times daily. Apply to mouth sores 11/11/21   Gustavus Bryant, FNP  Triamcinolone Acetonide 0.025 % LOTN as needed.    [provider]  valACYclovir (VALTREX) 500 MG tablet Take 500 mg by mouth as needed. 09/17/21   [provider]  Vitamin D, Ergocalciferol, (DRISDOL) 1.25 MG (50000 UT) CAPS capsule Take 50,000 Units by mouth See admin instructions. Take 1 tablet (50000 units) by mouth every Monday, Wednesday, Saturday    [provider]    Family History Family History  Problem Relation Age of Onset   Hyperlipidemia Mother    Depression Mother    Sarcoidosis Father    Lung disease Father        Pleural Mesothelioma   Cancer Father    Heart attack Paternal Grandmother    Hypertension Paternal Grandmother    Hypertension Maternal Grandmother    Diabetes Maternal Grandmother    Stroke Neg Hx    Colon  cancer Neg Hx    Esophageal cancer Neg Hx    Rectal cancer Neg Hx    Stomach cancer Neg Hx     Social History Social History   Tobacco Use   Smoking status: Never    Passive exposure: Never   Smokeless tobacco: Never  Vaping Use   Vaping Use: Never used  Substance Use Topics   Alcohol use: No   Drug use: No     Allergies   Influenza vaccines, Influenza virus vaccine, Amitriptyline, and Isosorbide   Review of Systems Review of Systems  Constitutional:  Positive for chills. Negative for fever.  HENT:  Positive for congestion, sinus pressure and sore throat. Negative for ear pain.   Eyes:  Negative for discharge and redness.  Respiratory:  Positive for cough. Negative for shortness of breath and wheezing.   Gastrointestinal:  Negative for abdominal pain, diarrhea, nausea and vomiting.     Physical Exam Triage Vital Signs ED Triage Vitals [04/26/22 1133]  Enc Vitals Group     BP 123/86     Pulse Rate 76     Resp 16     Temp 98.1 F (36.7 C)     Temp Source Oral     SpO2 95 %     Weight      Height      Head Circumference      Peak Flow      Pain Score 3     Pain Loc      Pain Edu?      Excl. in GC?    No data found.  Updated Vital Signs BP 123/86 (BP Location: Left Arm)  Pulse 76   Temp 98.1 F (36.7 C) (Oral)   Resp 16   SpO2 95%     Physical Exam Vitals and nursing note reviewed.  Constitutional:      General: She is not in acute distress.    Appearance: Normal appearance. She is not ill-appearing.  HENT:     Head: Normocephalic and atraumatic.     Right Ear: Tympanic membrane normal.     Left Ear: Tympanic membrane normal.     Nose: Congestion present.     Mouth/Throat:     Mouth: Mucous membranes are moist.     Pharynx: Posterior oropharyngeal erythema present. No oropharyngeal exudate.  Eyes:     Conjunctiva/sclera: Conjunctivae normal.  Cardiovascular:     Rate and Rhythm: Normal rate and regular rhythm.     Heart sounds: Normal  heart sounds. No murmur heard. Pulmonary:     Effort: Pulmonary effort is normal. No respiratory distress.     Breath sounds: Normal breath sounds. No wheezing, rhonchi or rales.  Skin:    General: Skin is warm and dry.  Neurological:     Mental Status: She is alert.  Psychiatric:        Mood and Affect: Mood normal.        Thought Content: Thought content normal.      UC Treatments / Results  Labs (all labs ordered are listed, but only abnormal results are displayed) Labs Reviewed  RESP PANEL BY RT-PCR (FLU A&B, COVID) [id number]    EKG   Radiology No results found.  Procedures Procedures (including critical care time)  Medications Ordered in UC Medications  triamcinolone acetonide (KENALOG-40) injection 40 mg (40 mg Intramuscular Given 04/26/22 1148)    Initial Impression / Assessment and Plan / UC Course  I have reviewed the triage vital signs and the nursing notes.  Pertinent labs & imaging results that were available during my care of the patient were reviewed by me and considered in my medical decision making (see chart for details).    Screening ordered for COVID and flu.  Kenalog injection administered in office to hopefully help improve sinus congestion and pharyngitis.  Patient questions if antibiotic is necessary and I discussed that symptoms are likely viral in nature.  Recommended continued symptomatic treatment at home and follow-up with any further concerns.  Final Clinical Impressions(s) / UC Diagnoses   Final diagnoses:  Acute upper respiratory infection  Encounter for screening for COVID-19   Discharge Instructions   None    ED Prescriptions   None    PDMP not reviewed this encounter.   Tomi Bamberger, PA-C 04/26/22 1242

## 2022-04-26 NOTE — ED Triage Notes (Signed)
Pt c/o cough, chest congestion, fatigue, malaise, onset ~ Saturday

## 2022-05-02 ENCOUNTER — Other Ambulatory Visit: Payer: Self-pay | Admitting: Physician Assistant

## 2022-05-03 NOTE — Telephone Encounter (Signed)
Next Visit: 07/09/2022   Last Visit: 01/27/2022   Last Fill: 01/27/2022  Dx: Fibromyalgia   Current Dose per office note on 01/27/2022: not discussed  Okay to refill Flexeril?

## 2022-05-20 ENCOUNTER — Other Ambulatory Visit: Payer: Self-pay | Admitting: Physician Assistant

## 2022-05-20 ENCOUNTER — Ambulatory Visit (HOSPITAL_COMMUNITY)
Admission: RE | Admit: 2022-05-20 | Discharge: 2022-05-20 | Disposition: A | Discharge status: Home / Self Care | Payer: BC Managed Care – PPO | Source: Ambulatory Visit | Attending: Rheumatology | Admitting: Rheumatology

## 2022-05-20 ENCOUNTER — Other Ambulatory Visit: Payer: Self-pay | Admitting: Pharmacist

## 2022-05-20 VITALS — BP 132/85 | HR 60 | Temp 97.8°F | Resp 18

## 2022-05-20 DIAGNOSIS — M0579 Rheumatoid arthritis with rheumatoid factor of multiple sites without organ or systems involvement: Secondary | ICD-10-CM | POA: Insufficient documentation

## 2022-05-20 DIAGNOSIS — Z79899 Other long term (current) drug therapy: Secondary | ICD-10-CM

## 2022-05-20 DIAGNOSIS — M069 Rheumatoid arthritis, unspecified: Secondary | ICD-10-CM | POA: Insufficient documentation

## 2022-05-20 DIAGNOSIS — Z111 Encounter for screening for respiratory tuberculosis: Secondary | ICD-10-CM

## 2022-05-20 LAB — COMPREHENSIVE METABOLIC PANEL
ALT: 17 U/L (ref 0–44)
AST: 25 U/L (ref 15–41)
Albumin: 3.3 g/dL — ABNORMAL LOW (ref 3.5–5.0)
Alkaline Phosphatase: 68 U/L (ref 38–126)
Anion gap: 7 (ref 5–15)
BUN: 11 mg/dL (ref 6–20)
CO2: 27 mmol/L (ref 22–32)
Calcium: 9.1 mg/dL (ref 8.9–10.3)
Chloride: 109 mmol/L (ref 98–111)
Creatinine, Ser: 0.98 mg/dL (ref 0.44–1.00)
GFR, Estimated: >60 mL/min (ref >60)
Glucose, Bld: 83 mg/dL (ref 70–99)
Potassium: 3.9 mmol/L (ref 3.5–5.1)
Sodium: 143 mmol/L (ref 135–145)
Total Bilirubin: 0.6 mg/dL (ref 0.3–1.2)
Total Protein: 6.8 g/dL (ref 6.5–8.1)

## 2022-05-20 LAB — CBC WITH DIFFERENTIAL/PLATELET
Abs Immature Granulocytes: 0.03 10*3/uL (ref 0.00–0.07)
Basophils Absolute: 0.1 10*3/uL (ref 0.0–0.1)
Basophils Relative: 1 %
Eosinophils Absolute: 0.2 10*3/uL (ref 0.0–0.5)
Eosinophils Relative: 2 %
HCT: 43.9 % (ref 36.0–46.0)
Hemoglobin: 13.5 g/dL (ref 12.0–15.0)
Immature Granulocytes: 0 %
Lymphocytes Relative: 39 %
Lymphs Abs: 3.9 10*3/uL (ref 0.7–4.0)
MCH: 26.4 pg (ref 26.0–34.0)
MCHC: 30.8 g/dL (ref 30.0–36.0)
MCV: 85.9 fL (ref 80.0–100.0)
Monocytes Absolute: 0.7 10*3/uL (ref 0.1–1.0)
Monocytes Relative: 7 %
Neutro Abs: 5.2 10*3/uL (ref 1.7–7.7)
Neutrophils Relative %: 51 %
Platelets: 289 10*3/uL (ref 150–400)
RBC: 5.11 MIL/uL (ref 3.87–5.11)
RDW: 12.6 % (ref 11.5–15.5)
WBC: 10.1 10*3/uL (ref 4.0–10.5)
nRBC: 0 % (ref 0.0–0.2)

## 2022-05-20 MED ORDER — DIPHENHYDRAMINE HCL 25 MG PO CAPS
ORAL_CAPSULE | ORAL | Status: AC
  Filled 2022-05-20: qty 1

## 2022-05-20 MED ORDER — ACETAMINOPHEN 325 MG PO TABS
650.0000 mg | ORAL_TABLET | ORAL | Status: AC
  Administered 2022-05-20: 650 mg via ORAL

## 2022-05-20 MED ORDER — DIPHENHYDRAMINE HCL 25 MG PO CAPS
25.0000 mg | ORAL_CAPSULE | ORAL | Status: AC
  Administered 2022-05-20: 25 mg via ORAL

## 2022-05-20 MED ORDER — SODIUM CHLORIDE 0.9 % IV SOLN
1000.0000 mg | INTRAVENOUS | Status: AC
  Administered 2022-05-20: 1000 mg via INTRAVENOUS
  Filled 2022-05-20: qty 40

## 2022-05-20 MED ORDER — ACETAMINOPHEN 325 MG PO TABS
ORAL_TABLET | ORAL | Status: AC
  Filled 2022-05-20: qty 2

## 2022-05-20 NOTE — Progress Notes (Signed)
CBC and CMP are stable.

## 2022-05-20 NOTE — Progress Notes (Signed)
Next infusion scheduled for Orencia IV on 06/17/2022 and due for updated orders. Diagnosis: RA  Dose: 1000mg  every 4 weeks (appropriate based on last recorded weight of 119.7kg)  Last Clinic Visit: 01/27/2022 Next Clinic Visit: 07/09/2022  Last infusion: 05/20/2022  Labs: CBC and CMP on 05/20/2022 stable TB Gold: negative on 08/12/2021   Orders placed for Orencia IV x 3 doses along with premedication of acetaminophen and diphenhydramine to be administered 30 minutes before medication infusion.  Standing CBC with diff/platelet and CMP with GFR orders placed to be drawn every 2 months.  Next TB gold due 08/13/2022 (order placed today)  10/13/2022, PharmD, MPH, BCPS, CPP Clinical Pharmacist (Rheumatology and Pulmonology)

## 2022-05-21 NOTE — Telephone Encounter (Signed)
Next Visit: 07/09/2022   Last Visit: 01/27/2022   Last Fill: 02/04/2022   Current Dose per office note 01/27/2022: dose not discussed   Dx: Trochanteric bursitis of both hips   Okay to refill Lidoderm patches?

## 2022-06-04 ENCOUNTER — Other Ambulatory Visit: Payer: Self-pay | Admitting: Physician Assistant

## 2022-06-17 ENCOUNTER — Ambulatory Visit (HOSPITAL_COMMUNITY)
Admission: RE | Admit: 2022-06-17 | Discharge: 2022-06-17 | Disposition: A | Discharge status: Home / Self Care | Payer: BC Managed Care – PPO | Source: Ambulatory Visit | Attending: Rheumatology | Admitting: Rheumatology

## 2022-06-17 DIAGNOSIS — M0579 Rheumatoid arthritis with rheumatoid factor of multiple sites without organ or systems involvement: Secondary | ICD-10-CM | POA: Insufficient documentation

## 2022-06-17 MED ORDER — ACETAMINOPHEN 325 MG PO TABS: 650.0000 mg | ORAL_TABLET | ORAL | Status: DC

## 2022-06-17 MED ORDER — DIPHENHYDRAMINE HCL 25 MG PO CAPS
ORAL_CAPSULE | ORAL | Status: AC
  Administered 2022-06-17: 25 mg via ORAL
  Filled 2022-06-17: qty 1

## 2022-06-17 MED ORDER — ACETAMINOPHEN 325 MG PO TABS
ORAL_TABLET | ORAL | Status: AC
  Administered 2022-06-17: 650 mg via ORAL
  Filled 2022-06-17: qty 2

## 2022-06-17 MED ORDER — SODIUM CHLORIDE 0.9 % IV SOLN
1000.0000 mg | INTRAVENOUS | Status: DC
  Administered 2022-06-17: 1000 mg via INTRAVENOUS
  Filled 2022-06-17: qty 40

## 2022-06-17 MED ORDER — DIPHENHYDRAMINE HCL 25 MG PO CAPS: 25.0000 mg | ORAL_CAPSULE | ORAL | Status: DC

## 2022-06-25 NOTE — Progress Notes (Deleted)
Office Visit Note  Patient: [name] [name]             Date of Birth: 1965/07/25           MRN: [id number]             PCP: Jonathon Resides, MD (Inactive) Referring: Jonathon Resides, MD Visit Date: 07/09/2022 Occupation: '@GUAROCC'$ @  Subjective:  No chief complaint on file.   History of Present Illness: [name] [name] is a 57 y.o. female ***     Activities of Daily Living:  Patient reports morning stiffness for *** {minute/hour:19697}.   Patient {ACTIONS;DENIES/REPORTS:21021675::"Denies"} nocturnal pain.  Difficulty dressing/grooming: {ACTIONS;DENIES/REPORTS:21021675::"Denies"} Difficulty climbing stairs: {ACTIONS;DENIES/REPORTS:21021675::"Denies"} Difficulty getting out of chair: {ACTIONS;DENIES/REPORTS:21021675::"Denies"} Difficulty using hands for taps, buttons, cutlery, and/or writing: {ACTIONS;DENIES/REPORTS:21021675::"Denies"}  No Rheumatology ROS completed.   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   Dyslipidemia 01/30/2015   MDD (major depressive disorder), recurrent severe, without psychosis ([place]) 12/22/2014   Essential hypertension 08/25/2014   Morbid obesity ([place]) 08/25/2014   CAD (coronary artery disease), native coronary artery 08/24/2014   Numbness and tingling in left arm    Arm numbness left 08/22/2014   Chest pain 08/22/2014   Gastroesophageal reflux disease without esophagitis  07/23/2014   Coronary artery calcification seen on CAT scan 02/19/2014   Hip pain 10/23/2013   Solitary pulmonary nodule 08/28/2013   Chest pain, atypical 07/18/2013   Heart palpitations 06/27/2013   Cough [id number]   Diastolic dysfunction 123456   SOB (shortness of breath) 05/09/2013   Other malaise and fatigue 01/21/2013   Stress and adjustment reaction 12/09/2012   Right sided abdominal pain 11/29/2012   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/21/2012   Morbid obesity with BMI of 50.0-59.9, adult ([place]) 11/21/2012   Arthritis 07/13/2012   Depression 07/13/2012   Routine health maintenance 05/07/2012   Migraine 06/07/1998    Past Medical History:  Diagnosis Date   Anxiety    Bursitis    CAD (coronary artery disease), native coronary artery    Coronary CTA showed minimal calcified plaque in the proximal and mid LAD less than 25%, mild left atrial enlargement and a coronary calcium score to 26 which is 99th percentile for age and sex matched controls by coronary CTA 10-2021   Depression    Diastolic dysfunction    Dyslipidemia 01/30/2015   Esophageal ring    Fibromyalgia    Hiatal hernia    HSV-1 infection    Morbid obesity ([place])    Osteoarthritis    Rheumatoid arthritis(714.0)    M05.79   Rheumatoid arthritis, seropositive, 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[place])    Treated with Orencia, TB neg 09/12/2015   Spondylolysis     Family History  Problem Relation Age of Onset   Hyperlipidemia Mother    Depression Mother    Sarcoidosis Father    Lung disease Father        Pleural Mesothelioma   Cancer Father  Heart attack Paternal Grandmother    Hypertension Paternal Grandmother    Hypertension Maternal Grandmother    Diabetes Maternal Grandmother    Stroke Neg Hx    Colon cancer Neg Hx    Esophageal cancer Neg Hx    Rectal cancer Neg Hx    Stomach cancer Neg Hx    Past Surgical History:  Procedure Laterality Date   CARDIAC CATHETERIZATION N/A 06/11/2016   Procedure:  Left Heart Cath and Coronary Angiography;  Surgeon: Jettie Booze, MD;  Location: [place] CV LAB;  Service: Cardiovascular;  Laterality: N/A;   CESAREAN SECTION     COLONOSCOPY  07/27/2017   ESOPHAGEAL MANOMETRY N/A 10/28/2014   Procedure: ESOPHAGEAL MANOMETRY (EM);  Surgeon: Irene Shipper, MD;  Location: [facility]OS[facility];  Service: Endoscopy;  Laterality: N/A;   HERNIA REPAIR     reports surgery on 3 hernias, with 2 more present   LAPAROSCOPIC GASTRIC SLEEVE RESECTION  11/21/12   [facility]   LEFT HEART CATHETERIZATION WITH CORONARY ANGIOGRAM N/A 08/26/2014   Procedure: LEFT HEART CATHETERIZATION WITH CORONARY ANGIOGRAM;  Surgeon: Lorretta Harp, MD;  Location: [facility] CATH LAB;  Service: Cardiovascular;  Laterality: N/A;   TUBAL LIGATION     Social History   Social History Narrative   Marital Status: Married Aeronautical engineer)    Children:  Son Marcello Moores) Daughter Margreta Journey)    Pets: None    Living Situation: Lives with husband and children    Occupation: Radiation protection practitioner Scientist, clinical (histocompatibility and immunogenetics))     Education: Dietitian (Psychology)     Tobacco Use/Exposure:  None    Alcohol Use:  Occasional   Drug Use:  None   Diet:  Regular   Exercise: Walking or Treadmill (2 x week)   Hobbies: Barrister's clerk, [employer] Decorations.               Immunization History  Administered Date(s) Administered   PFIZER(Purple Top)SARS-COV-2 Vaccination 08/30/2019, 09/20/2019   Tdap 09/21/2016     Objective: Vital Signs: There were no vitals taken for this visit.   Physical Exam   Musculoskeletal Exam: ***  CDAI Exam: CDAI Score: -- Patient Global: --; Provider Global: -- Swollen: --; Tender: -- Joint Exam 07/09/2022   No joint exam has been documented for this visit   There is currently no information documented on the homunculus. Go to the Rheumatology activity and complete the homunculus joint exam.  Investigation: No additional findings.  Imaging: No results found.  Recent  Labs: Lab Results  Component Value Date   WBC 10.1 05/20/2022   HGB 13.5 05/20/2022   PLT 289 05/20/2022   NA 143 05/20/2022   K 3.9 05/20/2022   CL 109 05/20/2022   CO2 27 05/20/2022   GLUCOSE 83 05/20/2022   BUN 11 05/20/2022   CREATININE 0.98 05/20/2022   BILITOT 0.6 05/20/2022   ALKPHOS 68 05/20/2022   AST 25 05/20/2022   ALT 17 05/20/2022   PROT 6.8 05/20/2022   ALBUMIN 3.3 (L) 05/20/2022   CALCIUM 9.1 05/20/2022   GFRAA >60 12/26/2019   QFTBGOLD Negative 09/01/2016   QFTBGOLDPLUS Negative 08/12/2021    Speciality Comments: ORENCIA 1000 mg x 4 weeks  Procedures:  No procedures performed Allergies: Influenza vaccines, Influenza virus vaccine, Amitriptyline, and Isosorbide   Assessment / Plan:     Visit Diagnoses: No diagnosis found.  Orders: No orders of the defined types were placed in this encounter.  No orders of the defined types were placed in this encounter.  Face-to-face time spent with patient was *** minutes. Greater than 50% of time was spent in counseling and coordination of care.  Follow-Up Instructions: No follow-ups on file.   Earnestine Mealing, CMA  Note - This record has been created using Editor, commissioning.  Chart creation errors have been sought, but may not always  have been located. Such creation errors do not reflect on  the standard of medical care.

## 2022-07-07 ENCOUNTER — Ambulatory Visit (INDEPENDENT_AMBULATORY_CARE_PROVIDER_SITE_OTHER): Payer: BC Managed Care – PPO

## 2022-07-07 ENCOUNTER — Ambulatory Visit
Admission: EM | Admit: 2022-07-07 | Discharge: 2022-07-07 | Disposition: A | Discharge status: Home / Self Care | Payer: BC Managed Care – PPO | Attending: Physician Assistant | Admitting: Physician Assistant

## 2022-07-07 DIAGNOSIS — R051 Acute cough: Secondary | ICD-10-CM

## 2022-07-07 DIAGNOSIS — U071 COVID-19: Secondary | ICD-10-CM

## 2022-07-07 DIAGNOSIS — R0602 Shortness of breath: Secondary | ICD-10-CM | POA: Diagnosis not present

## 2022-07-07 DIAGNOSIS — R059 Cough, unspecified: Secondary | ICD-10-CM

## 2022-07-07 DIAGNOSIS — G4459 Other complicated headache syndrome: Secondary | ICD-10-CM | POA: Diagnosis not present

## 2022-07-07 MED ORDER — ONDANSETRON 4 MG PO TBDP
4.0000 mg | ORAL_TABLET | Freq: Once | ORAL | Status: AC
  Administered 2022-07-07: 4 mg via ORAL

## 2022-07-07 MED ORDER — KETOROLAC TROMETHAMINE 30 MG/ML IJ SOLN
30.0000 mg | Freq: Once | INTRAMUSCULAR | Status: AC
  Administered 2022-07-07: 30 mg via INTRAMUSCULAR

## 2022-07-07 MED ORDER — METOCLOPRAMIDE HCL 5 MG/ML IJ SOLN
5.0000 mg | Freq: Once | INTRAMUSCULAR | Status: AC
  Administered 2022-07-07: 5 mg via INTRAMUSCULAR

## 2022-07-07 MED ORDER — IBUPROFEN 600 MG PO TABS: 600.0000 mg | ORAL_TABLET | Freq: Four times a day (QID) | ORAL | 0 refills | Status: DC | PRN

## 2022-07-07 MED ORDER — PROMETHAZINE-DM 6.25-15 MG/5ML PO SYRP: 5.0000 mL | ORAL_SOLUTION | Freq: Four times a day (QID) | ORAL | 0 refills | Status: DC | PRN

## 2022-07-07 MED ORDER — PAXLOVID (300/100) 20 X 150 MG & 10 X 100MG PO TBPK: 3.0000 | ORAL_TABLET | Freq: Two times a day (BID) | ORAL | 0 refills | Status: AC

## 2022-07-07 NOTE — Discharge Instructions (Addendum)
Advised to take the Paxlovid as directed, 3 tablets twice a day for 5 days to treat COVID. Advised to take the Phenergan DM, every 6 hours to control cough and congestion. Advised take ibuprofen 600 mg every 6 hours with food to help decrease cough and congestion.  (This medicine will also control nausea, but it does cause drowsiness so use with caution)  Advised to increase fluid intake, rest over the next couple days.  Advised follow-up PCP or return to urgent care if symptoms fail to improve

## 2022-07-07 NOTE — ED Triage Notes (Signed)
Sore throat, headache, cough, body aches, chills, SOB, that started yesterday. Patient took a home COVID test this morning and states it was positive. Taking tylenol and mucinex today.

## 2022-07-07 NOTE — ED Provider Notes (Signed)
[facility]    CSN: [id number] Arrival date & time: 07/07/22  1000      History   Chief Complaint Chief Complaint  Patient presents with   Cough   Headache    HPI [name] [name] is a 57 y.o. female.   -year-old female presents with headache, cough, fatigue, chills.  Patient indicates yesterday she started having headache, dull, frontal, constant.  Patient indicates that she has taken some Tylenol without relief.  She does indicate that she has a history of having migraine headaches.  She indicates she has had nausea with this headache without vomiting.  She also indicates that she has been having upper respiratory congestion with rhinitis which is mainly been clear.  She indicates she has had chest congestion with intermittent cough and clear production.  She has had some shortness of breath but no wheezing with her cough.  She indicates she does have some right lower chest discomfort when she has coughing spasms.  She has had fatigue, chills, sweats, body aches and pains.  Patient indicates that she did do a COVID test yesterday and it was positive.  She relates she has had COVID-vaccine along with booster and the last 1 being 2022.  She is tolerating fluids well.  Patient indicates that she did sit in the ER with her mother for 8 to 9 hours and she believes that she may have caught the infection sitting in the ER.  Indicates she does feel fatigued and weak.   Cough Associated symptoms: headaches and shortness of breath   Headache Associated symptoms: cough and drainage     Past Medical History:  Diagnosis Date   Anxiety    Bursitis    CAD (coronary artery disease), native coronary artery    Coronary CTA showed minimal calcified plaque in the proximal and mid LAD less than 25%, mild left atrial enlargement and a coronary calcium score to 73 which is 99th percentile for age and sex matched controls by coronary CTA 10-2021   Depression    Diastolic dysfunction     Dyslipidemia 01/30/2015   Esophageal ring    Fibromyalgia    Hiatal hernia    HSV-1 infection    Morbid obesity (HCC)    Osteoarthritis    Rheumatoid arthritis(714.0)    M05.79   Rheumatoid arthritis, seropositive, 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HCC)    Treated with Orencia, TB neg 09/12/2015   Spondylolysis     Patient Active Problem List   Diagnosis Date Noted   Amenorrhea 06/09/2020   Anxiety 02/20/2018   Fatigue 02/20/2018   Low blood potassium 02/20/2018   Allergic rhinitis 12/14/2017   Fever blister 12/14/2017   Head congestion 12/14/2017   Hyperlipidemia 12/14/2017   Lateral epicondylitis of right elbow 05/04/2017   Sacral pain 03/23/2017   High risk medication use 08/03/2016   Fibromyalgia syndrome 08/03/2016   Abnormal cardiac CT angiography    Sacrococcygeal pain 02/20/2016   Rheumatoid arthritis involving 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HCC) 01/24/2016   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   Dyslipidemia 01/30/2015   MDD (major depressive disorder), recurrent severe, 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   Essential hypertension 08/25/2014   Morbid obesity (HCC) 08/25/2014   CAD (coronary artery disease), native coronary artery 08/24/2014   Numbness and tingling in left arm    Arm numbness left 08/22/2014   Chest pain 08/22/2014   Gastroesophageal reflux disease without esophagitis 07/23/2014   Coronary artery calcification seen on CAT scan 02/19/2014   Hip pain 10/23/2013  Solitary pulmonary nodule 08/28/2013   Chest pain, atypical 07/18/2013   Heart palpitations 06/27/2013   Cough 06/04/2013   Diastolic dysfunction 05/18/2013   SOB (shortness of breath) 05/09/2013   Other malaise and fatigue 01/21/2013   Stress and adjustment reaction 12/09/2012   Right sided abdominal pain 11/29/2012   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/21/2012   Morbid obesity with BMI of 50.0-59.9, adult (HCC) 11/21/2012   Arthritis 07/13/2012   Depression 07/13/2012   Routine health maintenance  05/07/2012   Migraine 06/07/1998    Past Surgical History:  Procedure Laterality Date   CARDIAC CATHETERIZATION N/A 06/11/2016   Procedure: Left Heart Cath and Coronary Angiography;  Surgeon: Corky Crafts, MD;  Location: [facility] INVASIVE CV LAB;  Service: Cardiovascular;  Laterality: N/A;   CESAREAN SECTION     COLONOSCOPY  07/27/2017   ESOPHAGEAL MANOMETRY N/A 10/28/2014   Procedure: ESOPHAGEAL MANOMETRY (EM);  Surgeon: Hilarie Fredrickson, MD;  Location: [facility] ENDOSCOPY;  Service: Endoscopy;  Laterality: N/A;   HERNIA REPAIR     reports surgery on 3 hernias, with 2 more present   LAPAROSCOPIC GASTRIC SLEEVE RESECTION  11/21/12   [facility]   LEFT HEART CATHETERIZATION WITH CORONARY ANGIOGRAM N/A 08/26/2014   Procedure: LEFT HEART CATHETERIZATION WITH CORONARY ANGIOGRAM;  Surgeon: [name] Gess, MD;  Location: [facility] CATH LAB;  Service: Cardiovascular;  Laterality: N/A;   TUBAL LIGATION      OB History   No obstetric history on file.      Home Medications    Prior to Admission medications   Medication Sig Start Date End Date Taking? Authorizing Provider  Abatacept (ORENCIA IV) Inject 1,000 mg into the vein every 28 (twenty-eight) days.   Yes Deveshwar, Janalyn Rouse, MD  acetaminophen-codeine (TYLENOL #3) 300-30 MG tablet Take 1 tablet by mouth every 6 (six) hours as needed for moderate pain. 09/21/21  Yes Kirtland Bouchard, PA-C  ARIPiprazole (ABILIFY) 2 MG tablet Take 2 mg by mouth daily. 09/25/21  Yes [provider]  aspirin EC 81 MG tablet Take 1 tablet (81 mg total) by mouth daily. Swallow whole. 10/13/21  Yes Turner, Cornelious Bryant, MD  azelastine (ASTELIN) 0.1 % nasal spray Place 2 sprays into both nostrils 2 (two) times daily. Patient taking differently: Place 2 sprays into both nostrils as needed. 01/12/20  Yes Yu, Amy V, PA-C  benzonatate (TESSALON) 100 MG capsule Take 1 capsule (100 mg total) by mouth 2 (two) times daily as needed for cough. 04/15/21  Yes Freddy Finner, NP  betamethasone,  augmented, (DIPROLENE) 0.05 % lotion Apply topically as needed. 01/23/22  Yes [provider]  buPROPion (WELLBUTRIN XL) 150 MG 24 hr tablet Take 450 mg by mouth every morning. 05/23/19  Yes [provider]  buPROPion (WELLBUTRIN XL) 150 MG 24 hr tablet Take 3 tablets by mouth every morning.   Yes [provider]  cetirizine (ZYRTEC ALLERGY) 10 MG tablet Take 1 tablet (10 mg total) by mouth daily. 10/08/21  Yes Wallis Bamberg, PA-C  Cetirizine HCl (ZYRTEC ALLERGY PO) 10 mg as needed.   Yes [provider]  Ciclopirox 1 % shampoo Apply topically as needed. 01/25/22  Yes [provider]  cyclobenzaprine (FLEXERIL) 5 MG tablet TAKE 1 TABLET BY MOUTH AT BEDTIME AS NEEDED. 05/03/22  Yes Deveshwar, Janalyn Rouse, MD  diclofenac Sodium (VOLTAREN) 1 % GEL Apply 2-4 grams to affected joint 4 times daily as needed. 01/27/22  Yes Gearldine Bienenstock, PA-C  diltiazem (CARDIZEM CD) 180  MG 24 hr capsule Take 1 capsule (180 mg total) by mouth daily. 12/15/21  Yes Turner, Eber Hong, MD  estradiol (ESTRACE) 0.5 MG tablet Take 0.5 mg by mouth daily. 09/16/21  Yes [provider]  famotidine (PEPCID) 20 MG tablet Take 20 mg by mouth as needed. 09/22/21  Yes [provider]  famotidine (PEPCID) 20 MG tablet daily.   Yes [provider]  fluticasone (FLONASE) 50 MCG/ACT nasal spray as needed.   Yes [provider]  Folic Acid-Vit X4-JOI N86 (FABB) 2.2-25-1 MG TABS Take 1 tablet by mouth daily. 01/27/22  Yes Ofilia Neas, PA-C  gabapentin (NEURONTIN) 300 MG capsule TAKE 1 CAPSULE BY MOUTH THREE TIMES A DAY AS NEEDED FOR PAIN 04/09/22  Yes Deveshwar, Abel Presto, MD  ibuprofen (ADVIL) 600 MG tablet Take 1 tablet (600 mg total) by mouth every 6 (six) hours as needed. 07/07/22  Yes Nyoka Lint, PA-C  LINZESS 145 MCG CAPS capsule Take 145 mcg by mouth daily. 01/26/22  Yes [provider]  neomycin-polymyxin-hydrocortisone (CORTISPORIN) 3.5-10000-1 OTIC suspension as  needed.   Yes [provider]  nirmatrelvir & ritonavir (PAXLOVID, 300/100,) 20 x 150 MG & 10 x 100MG  TBPK Take 3 tablets by mouth 2 (two) times daily for 5 days. 07/07/22 07/12/22 Yes Nyoka Lint, PA-C  [facility] powder Apply topically. 03/17/20  Yes [provider]  nystatin cream (MYCOSTATIN) Apply topically. 01/23/22  Yes [provider]  progesterone (PROMETRIUM) 100 MG capsule Take 100 mg by mouth daily. 09/16/21  Yes [provider]  PROGESTERONE PO 100 mg daily.   Yes [provider]  promethazine-dextromethorphan (PROMETHAZINE-DM) 6.25-15 MG/5ML syrup Take 5 mLs by mouth 4 (four) times daily as needed for cough. 07/07/22  Yes Nyoka Lint, PA-C  propranolol (INDERAL) 10 MG tablet Take 10 mg by mouth daily.   Yes [provider]  RASUVO 25 MG/0.5ML SOAJ INJECT ONE PEN SUBCUTANEOUSLY ONCE EVERY WEEK. STORE AT ROOM TEMPERATURE BETWEEN 68 - 77 DEGREES F. 02/23/22  Yes Deveshwar, Abel Presto, MD  rosuvastatin (CRESTOR) 40 MG tablet Take 1 tablet (40 mg total) by mouth daily. 12/15/21  Yes Turner, Eber Hong, MD  Triamcinolone Acetonide 0.025 % LOTN as needed.   Yes [provider]  Vitamin D, Ergocalciferol, (DRISDOL) 1.25 MG (50000 UT) CAPS capsule Take 50,000 Units by mouth See admin instructions. Take 1 tablet (50000 units) by mouth every Monday, Wednesday, Saturday   Yes [provider]  acyclovir ointment (ZOVIRAX) 5 % as needed.    [provider]  Acyclovir-Hydrocortisone (XERESE) 5-1 % CREA as needed.    [provider]  Adapalene 0.3 % gel Apply 1 application topically daily as needed (acne).  12/31/14   [provider]  Alcohol Swabs (ALCOHOL WIPES) 70 % PADS Alcohol Prep Pads    [provider]  cetirizine-pseudoephedrine (ZYRTEC-D) 5-120 MG tablet Take 1 tablet by mouth 2 (two) times daily. Patient not taking: Reported on 01/27/2022 07/03/21   Chevis Pretty, FNP  ciclopirox (LOPROX) 0.77 %  cream as needed.    [provider]  doxepin (SINEQUAN) 25 MG capsule Take 25 mg by mouth at bedtime as needed. 04/28/19   [provider]  EPINEPHrine 0.3 mg/0.3 mL IJ SOAJ injection  07/02/19   [provider]  ezetimibe (ZETIA) 10 MG tablet Take 1 tablet (10 mg total) by mouth daily. 12/15/21   Sueanne Margarita, MD  famciclovir (FAMVIR) 500 MG tablet Take 15,000 mg by mouth daily as needed (fever  blister).  01/21/15   [provider]  fluticasone (FLONASE) 50 MCG/ACT nasal spray Place 1 spray into both nostrils daily for 3 days. 11/11/21 11/14/21  Teodora Medici, FNP  hydrocortisone 2.5 % cream SMARTSIG:1 Topical Every Night 11/06/19   [provider]  hydrOXYzine (VISTARIL) 25 MG capsule Take 25 mg by mouth as needed. 10/04/21   [provider]  ketoconazole (NIZORAL) 2 % cream  03/14/20   [provider]  ketoconazole (NIZORAL) 2 % shampoo  06/03/20   [provider]  levothyroxine (SYNTHROID) 25 MCG tablet as needed.    [provider]  lidocaine (LIDODERM) 5 % Apply 1 patch topically as needed.    [provider]  lidocaine (LIDODERM) 5 % APPLY 1 PATCH TO AFFECTED AREA FOR 12 HOURS IN A 24 HOUR PERIOD 05/21/22   Ofilia Neas, PA-C  LORazepam (ATIVAN) 1 MG tablet Take 1 tablet (1 mg total) by mouth 3 (three) times daily as needed for anxiety. 10/09/19   Faustino Congress, NP  metoprolol tartrate (LOPRESSOR) 100 MG tablet Take 1 tablet (100 mg total) by mouth once for 1 dose. Take 1 tablet (100 mg total) two hours prior to CT scan. 10/13/21 10/13/21  Sueanne Margarita, MD  nitroGLYCERIN (NITROSTAT) 0.4 MG SL tablet PLACE 1 TABLET UNDER THE TONGUE EVERY 5 MINUTES AS NEEDED FOR CHEST PAIN. 03/15/22   Sueanne Margarita, MD  Oxymetazoline HCl (NASAL SPRAY) 0.05 % SOLN Nasal Decongestant (oxymetazoline) 0.05 % spray    [provider]  predniSONE (DELTASONE) 20 MG tablet Take 2 tablets daily with breakfast. Patient not  taking: Reported on 01/27/2022 10/08/21   Jaynee Eagles, PA-C  promethazine-dextromethorphan (PROMETHAZINE-DM) 6.25-15 MG/5ML syrup Take 2.5 mLs by mouth 4 (four) times daily as needed for cough. 10/08/21   Jaynee Eagles, PA-C  pseudoephedrine (SUDAFED) 60 MG tablet Take 1 tablet (60 mg total) by mouth every 8 (eight) hours as needed for congestion. 10/08/21   Jaynee Eagles, PA-C  rizatriptan (MAXALT) 10 MG tablet Take 1 tablet (10 mg total) by mouth as needed for migraine. May repeat in 2 hours if needed Patient not taking: Reported on 01/27/2022 04/15/21   Perlie Mayo, NP  triamcinolone (KENALOG) 0.1 % paste Use as directed 1 application. in the mouth or throat 2 (two) times daily. Apply to mouth sores 11/11/21   Teodora Medici, FNP  valACYclovir (VALTREX) 500 MG tablet Take 500 mg by mouth as needed. 09/17/21   [provider]    Family History Family History  Problem Relation Age of Onset   Hyperlipidemia Mother    Depression Mother    Sarcoidosis Father    Lung disease Father        Pleural Mesothelioma   Cancer Father    Heart attack Paternal Grandmother    Hypertension Paternal Grandmother    Hypertension Maternal Grandmother    Diabetes Maternal Grandmother    Stroke Neg Hx    Colon cancer Neg Hx    Esophageal cancer Neg Hx    Rectal cancer Neg Hx    Stomach cancer Neg Hx     Social History Social History   Tobacco Use   Smoking status: Never    Passive exposure: Never   Smokeless tobacco: Never  Vaping Use   Vaping Use: Never used  Substance Use Topics   Alcohol use: No   Drug use: No     Allergies   Influenza vaccines, Influenza virus vaccine, Amitriptyline, and Isosorbide  Review of Systems Review of Systems  HENT:  Positive for postnasal drip.   Respiratory:  Positive for cough and shortness of breath.   Neurological:  Positive for headaches.     Physical Exam Triage Vital Signs ED Triage Vitals  Enc Vitals Group     BP 07/07/22 1029 (!) 135/91      Pulse Rate 07/07/22 1029 94     Resp 07/07/22 1029 19     Temp 07/07/22 1029 98 F (36.7 C)     Temp Source 07/07/22 1029 Oral     SpO2 07/07/22 1029 94 %     Weight --      Height --      Head Circumference --      Peak Flow --      Pain Score 07/07/22 1030 7     Pain Loc --      Pain Edu? --      Excl. in GC? --    No data found.  Updated Vital Signs BP (!) 135/91 (BP Location: Right Arm)   Pulse 94   Temp 98 F (36.7 C) (Oral)   Resp 19   SpO2 94%   Visual Acuity Right Eye Distance:   Left Eye Distance:   Bilateral Distance:    Right Eye Near:   Left Eye Near:    Bilateral Near:     Physical Exam Constitutional:      Appearance: She is well-developed.  HENT:     Right Ear: Tympanic membrane and ear canal normal.     Left Ear: Tympanic membrane and ear canal normal.     Mouth/Throat:     Mouth: Mucous membranes are moist.     Pharynx: Oropharynx is clear.  Cardiovascular:     Rate and Rhythm: Normal rate and regular rhythm.     Heart sounds: Normal heart sounds.  Pulmonary:     Effort: Pulmonary effort is normal.     Breath sounds: Normal breath sounds and air entry. No wheezing, rhonchi or rales.  Lymphadenopathy:     Cervical: No cervical adenopathy.  Neurological:     Mental Status: She is alert.      UC Treatments / Results  Labs (all labs ordered are listed, but only abnormal results are displayed) Labs Reviewed - No data to display  EKG   Radiology DG Chest 2 View  Result Date: 07/07/2022 CLINICAL DATA:  Cough, shortness of breath, COVID positive EXAM: CHEST - 2 VIEW COMPARISON:  07/02/2022 FINDINGS: The heart size and mediastinal contours are within normal limits. Both lungs are clear. The visualized skeletal structures are unremarkable. IMPRESSION: No active cardiopulmonary disease. Electronically Signed   By: Charlett Nose M.D.   On: 07/07/2022 12:05    Procedures Procedures (including critical care time)  Medications Ordered in  UC Medications  ketorolac (TORADOL) 30 MG/ML injection 30 mg (30 mg Intramuscular Given 07/07/22 1057)  metoCLOPramide (REGLAN) injection 5 mg (5 mg Intramuscular Given 07/07/22 1057)  ondansetron (ZOFRAN-ODT) disintegrating tablet 4 mg (4 mg Oral Given 07/07/22 1057)    Initial Impression / Assessment and Plan / UC Course  I have reviewed the triage vital signs and the nursing notes.  Pertinent labs & imaging results that were available during my care of the patient were reviewed by me and considered in my medical decision making (see chart for details).    Plan: The diagnosis will be treated with the following: 1.  Headache: A.  Headache medications given IM  in the office today.  (Toradol and Reglan only) B.  Zofran 4 mg given in the office today to control nausea. 2.  COVID: A.  Paxlovid prescription to treat COVID symptoms. 3.  Acute cough: A.  Phenergan DM, 1 teaspoon every 6 hours to control cough congestion. B.  Ibuprofen 600 mg every 6 hours on a regular basis to treat body aches and fever. 4.  Advised follow-up PCP or return to urgent care if symptoms fail to improve. Final Clinical Impressions(s) / UC Diagnoses   Final diagnoses:  COVID  Other complicated headache syndrome  Acute cough     Discharge Instructions      Advised to take the Paxlovid as directed, 3 tablets twice a day for 5 days to treat COVID. Advised to take the Phenergan DM, every 6 hours to control cough and congestion. Advised take ibuprofen 600 mg every 6 hours with food to help decrease cough and congestion.  (This medicine will also control nausea, but it does cause drowsiness so use with caution)  Advised to increase fluid intake, rest over the next couple days.  Advised follow-up PCP or return to urgent care if symptoms fail to improve    ED Prescriptions     Medication Sig Dispense Auth. Provider   promethazine-dextromethorphan (PROMETHAZINE-DM) 6.25-15 MG/5ML syrup Take 5 mLs by mouth 4  (four) times daily as needed for cough. 118 mL Nyoka Lint, PA-C   nirmatrelvir & ritonavir (PAXLOVID, 300/100,) 20 x 150 MG & 10 x 100MG  TBPK Take 3 tablets by mouth 2 (two) times daily for 5 days. 30 tablet Nyoka Lint, PA-C   ibuprofen (ADVIL) 600 MG tablet Take 1 tablet (600 mg total) by mouth every 6 (six) hours as needed. 30 tablet Nyoka Lint, PA-C      PDMP not reviewed this encounter.   Nyoka Lint, PA-C 07/07/22 1211

## 2022-07-09 ENCOUNTER — Ambulatory Visit: Payer: BC Managed Care – PPO | Admitting: Rheumatology

## 2022-07-09 DIAGNOSIS — Z8639 Personal history of other endocrine, nutritional and metabolic disease: Secondary | ICD-10-CM

## 2022-07-09 DIAGNOSIS — U071 COVID-19: Secondary | ICD-10-CM

## 2022-07-09 DIAGNOSIS — M0579 Rheumatoid arthritis with rheumatoid factor of multiple sites without organ or systems involvement: Secondary | ICD-10-CM

## 2022-07-09 DIAGNOSIS — E785 Hyperlipidemia, unspecified: Secondary | ICD-10-CM

## 2022-07-09 DIAGNOSIS — Z8679 Personal history of other diseases of the circulatory system: Secondary | ICD-10-CM

## 2022-07-09 DIAGNOSIS — M7061 Trochanteric bursitis, right hip: Secondary | ICD-10-CM

## 2022-07-09 DIAGNOSIS — R911 Solitary pulmonary nodule: Secondary | ICD-10-CM

## 2022-07-09 DIAGNOSIS — Z9884 Bariatric surgery status: Secondary | ICD-10-CM

## 2022-07-09 DIAGNOSIS — M797 Fibromyalgia: Secondary | ICD-10-CM

## 2022-07-09 DIAGNOSIS — M170 Bilateral primary osteoarthritis of knee: Secondary | ICD-10-CM

## 2022-07-09 DIAGNOSIS — G4709 Other insomnia: Secondary | ICD-10-CM

## 2022-07-09 DIAGNOSIS — R5383 Other fatigue: Secondary | ICD-10-CM

## 2022-07-09 DIAGNOSIS — Z79899 Other long term (current) drug therapy: Secondary | ICD-10-CM

## 2022-07-09 DIAGNOSIS — Z8659 Personal history of other mental and behavioral disorders: Secondary | ICD-10-CM

## 2022-07-13 ENCOUNTER — Telehealth: Payer: BC Managed Care – PPO | Admitting: Physician Assistant

## 2022-07-13 DIAGNOSIS — U071 COVID-19: Secondary | ICD-10-CM | POA: Diagnosis not present

## 2022-07-13 MED ORDER — PROMETHAZINE-DM 6.25-15 MG/5ML PO SYRP: 5.0000 mL | ORAL_SOLUTION | Freq: Four times a day (QID) | ORAL | 0 refills | Status: DC | PRN

## 2022-07-13 MED ORDER — IPRATROPIUM BROMIDE 0.03 % NA SOLN: 2.0000 | Freq: Three times a day (TID) | NASAL | Status: AC

## 2022-07-13 NOTE — Patient Instructions (Signed)
[name] [name], thank you for joining [place], PA-C for today's virtual visit.  While this provider is not your primary care provider (PCP), if your PCP is located in our provider database this encounter information will be shared with them immediately following your visit.   [facility] account gives you access to today's visit and all your visits, tests, and labs performed at [facility] " click here if you don't have a [facility] account or go to [email or web address]  Consent: (Patient) [name] [name] provided verbal consent for this virtual visit at the beginning of the encounter.  Current Medications:  Current Outpatient Medications:    promethazine-dextromethorphan (PROMETHAZINE-DM) 6.25-15 MG/5ML syrup, Take 5 mLs by mouth 4 (four) times daily as needed for cough., Disp: 118 mL, Rfl: 0   Abatacept (ORENCIA IV), Inject 1,000 mg into the vein every 28 (twenty-eight) days., Disp: , Rfl:    acetaminophen-codeine (TYLENOL #3) 300-30 MG tablet, Take 1 tablet by mouth every 6 (six) hours as needed for moderate pain., Disp: 10 tablet, Rfl: 0   acyclovir ointment (ZOVIRAX) 5 %, as needed., Disp: , Rfl:    Acyclovir-Hydrocortisone (XERESE) 5-1 % CREA, as needed., Disp: , Rfl:    Adapalene 0.3 % gel, Apply 1 application topically daily as needed (acne). , Disp: , Rfl:    Alcohol Swabs (ALCOHOL WIPES) 70 % PADS, Alcohol Prep Pads, Disp: , Rfl:    ARIPiprazole (ABILIFY) 2 MG tablet, Take 2 mg by mouth daily., Disp: , Rfl:    aspirin EC 81 MG tablet, Take 1 tablet (81 mg total) by mouth daily. Swallow whole., Disp: 90 tablet, Rfl: 3   benzonatate (TESSALON) 100 MG capsule, Take 1 capsule (100 mg total) by mouth 2 (two) times daily as needed for cough., Disp: 20 capsule, Rfl: 0   betamethasone, augmented, (DIPROLENE) 0.05 % lotion, Apply topically as needed., Disp: , Rfl:    buPROPion (WELLBUTRIN XL) 150 MG 24 hr tablet, Take 450 mg by mouth every  morning., Disp: , Rfl:    buPROPion (WELLBUTRIN XL) 150 MG 24 hr tablet, Take 3 tablets by mouth every morning., Disp: , Rfl:    cetirizine (ZYRTEC ALLERGY) 10 MG tablet, Take 1 tablet (10 mg total) by mouth daily., Disp: 30 tablet, Rfl: 0   Cetirizine HCl (ZYRTEC ALLERGY PO), 10 mg as needed., Disp: , Rfl:    cetirizine-pseudoephedrine (ZYRTEC-D) 5-120 MG tablet, Take 1 tablet by mouth 2 (two) times daily. (Patient not taking: Reported on 01/27/2022), Disp: 30 tablet, Rfl: 0   ciclopirox (LOPROX) 0.77 % cream, as needed., Disp: , Rfl:    Ciclopirox 1 % shampoo, Apply topically as needed., Disp: , Rfl:    cyclobenzaprine (FLEXERIL) 5 MG tablet, TAKE 1 TABLET BY MOUTH AT BEDTIME AS NEEDED., Disp: 30 tablet, Rfl: 2   diclofenac Sodium (VOLTAREN) 1 % GEL, Apply 2-4 grams to affected joint 4 times daily as needed., Disp: 400 g, Rfl: 2   diltiazem (CARDIZEM CD) 180 MG 24 hr capsule, Take 1 capsule (180 mg total) by mouth daily., Disp: 90 capsule, Rfl: 3   doxepin (SINEQUAN) 25 MG capsule, Take 25 mg by mouth at bedtime as needed., Disp: , Rfl:    EPINEPHrine 0.3 mg/0.3 mL IJ SOAJ injection, , Disp: , Rfl:    estradiol (ESTRACE) 0.5 MG tablet, Take 0.5 mg by mouth daily., Disp: , Rfl:    ezetimibe (ZETIA) 10 MG tablet, Take 1 tablet (10 mg total) by mouth daily.,  Disp: 90 tablet, Rfl: 3   famciclovir (FAMVIR) 500 MG tablet, Take 15,000 mg by mouth daily as needed (fever blister). , Disp: , Rfl: 11   famotidine (PEPCID) 20 MG tablet, Take 20 mg by mouth as needed., Disp: , Rfl:    famotidine (PEPCID) 20 MG tablet, daily., Disp: , Rfl:    fluticasone (FLONASE) 50 MCG/ACT nasal spray, Place 1 spray into both nostrils daily for 3 days., Disp: 16 g, Rfl: 0   fluticasone (FLONASE) 50 MCG/ACT nasal spray, as needed., Disp: , Rfl:    Folic Acid-Vit L2-GMW N02 (FABB) 2.2-25-1 MG TABS, Take 1 tablet by mouth daily., Disp: 90 tablet, Rfl: 2   gabapentin (NEURONTIN) 300 MG capsule, TAKE 1 CAPSULE BY MOUTH THREE  TIMES A DAY AS NEEDED FOR PAIN, Disp: 90 capsule, Rfl: 2   hydrocortisone 2.5 % cream, SMARTSIG:1 Topical Every Night, Disp: , Rfl:    hydrOXYzine (VISTARIL) 25 MG capsule, Take 25 mg by mouth as needed., Disp: , Rfl:    ibuprofen (ADVIL) 600 MG tablet, Take 1 tablet (600 mg total) by mouth every 6 (six) hours as needed., Disp: 30 tablet, Rfl: 0   ketoconazole (NIZORAL) 2 % cream, , Disp: , Rfl:    ketoconazole (NIZORAL) 2 % shampoo, , Disp: , Rfl:    levothyroxine (SYNTHROID) 25 MCG tablet, as needed., Disp: , Rfl:    lidocaine (LIDODERM) 5 %, Apply 1 patch topically as needed., Disp: , Rfl:    lidocaine (LIDODERM) 5 %, APPLY 1 PATCH TO AFFECTED AREA FOR 12 HOURS IN A 24 HOUR PERIOD, Disp: 30 patch, Rfl: 2   LINZESS 145 MCG CAPS capsule, Take 145 mcg by mouth daily., Disp: , Rfl:    LORazepam (ATIVAN) 1 MG tablet, Take 1 tablet (1 mg total) by mouth 3 (three) times daily as needed for anxiety., Disp: 15 tablet, Rfl: 0   metoprolol tartrate (LOPRESSOR) 100 MG tablet, Take 1 tablet (100 mg total) by mouth once for 1 dose. Take 1 tablet (100 mg total) two hours prior to CT scan., Disp: 1 tablet, Rfl: 0   neomycin-polymyxin-hydrocortisone (CORTISPORIN) 3.5-10000-1 OTIC suspension, as needed., Disp: , Rfl:    nitroGLYCERIN (NITROSTAT) 0.4 MG SL tablet, PLACE 1 TABLET UNDER THE TONGUE EVERY 5 MINUTES AS NEEDED FOR CHEST PAIN., Disp: 25 tablet, Rfl: 3   NYAMYC powder, Apply topically., Disp: , Rfl:    nystatin cream (MYCOSTATIN), Apply topically., Disp: , Rfl:    Oxymetazoline HCl (NASAL SPRAY) 0.05 % SOLN, Nasal Decongestant (oxymetazoline) 0.05 % spray, Disp: , Rfl:    predniSONE (DELTASONE) 20 MG tablet, Take 2 tablets daily with breakfast. (Patient not taking: Reported on 01/27/2022), Disp: 10 tablet, Rfl: 0   progesterone (PROMETRIUM) 100 MG capsule, Take 100 mg by mouth daily., Disp: , Rfl:    PROGESTERONE PO, 100 mg daily., Disp: , Rfl:    propranolol (INDERAL) 10 MG tablet, Take 10 mg by mouth  daily., Disp: , Rfl:    pseudoephedrine (SUDAFED) 60 MG tablet, Take 1 tablet (60 mg total) by mouth every 8 (eight) hours as needed for congestion., Disp: 30 tablet, Rfl: 0   RASUVO 25 MG/0.5ML SOAJ, INJECT ONE PEN SUBCUTANEOUSLY ONCE EVERY WEEK. STORE AT ROOM TEMPERATURE BETWEEN 79 - 65 DEGREES F., Disp: 6 mL, Rfl: 0   rizatriptan (MAXALT) 10 MG tablet, Take 1 tablet (10 mg total) by mouth as needed for migraine. May repeat in 2 hours if needed (Patient not taking: Reported on 01/27/2022), Disp: 10 tablet,  Rfl: 0   rosuvastatin (CRESTOR) 40 MG tablet, Take 1 tablet (40 mg total) by mouth daily., Disp: 90 tablet, Rfl: 3   triamcinolone (KENALOG) 0.1 % paste, Use as directed 1 application. in the mouth or throat 2 (two) times daily. Apply to mouth sores, Disp: 5 g, Rfl: 12   Triamcinolone Acetonide 0.025 % LOTN, as needed., Disp: , Rfl:    valACYclovir (VALTREX) 500 MG tablet, Take 500 mg by mouth as needed., Disp: , Rfl:    Vitamin D, Ergocalciferol, (DRISDOL) 1.25 MG (50000 UT) CAPS capsule, Take 50,000 Units by mouth See admin instructions. Take 1 tablet (50000 units) by mouth every Monday, Wednesday, Saturday, Disp: , Rfl:   Current Facility-Administered Medications:    ipratropium (ATROVENT) 0.03 % nasal spray 2 spray, 2 spray, Each Nare, TID, Ward, Lenise Arena, PA-C   Medications ordered in this encounter:  Meds ordered this encounter  Medications   promethazine-dextromethorphan (PROMETHAZINE-DM) 6.25-15 MG/5ML syrup    Sig: Take 5 mLs by mouth 4 (four) times daily as needed for cough.    Dispense:  118 mL    Refill:  0    Order Specific Question:   Supervising Provider    Answer:   Chase Picket [[id number]]   ipratropium (ATROVENT) 0.03 % nasal spray 2 spray     *If you need refills on other medications prior to your next appointment, please contact your pharmacy*  Follow-Up: Call back or seek an in-person evaluation if the symptoms worsen or if the condition fails to improve as  anticipated.  [facility] [phone]  Other Instructions Continue with cough syrup as needed.  Continue with inhaler as needed.  May use nasal spray for congestion.  If you develop worsening symptoms recommend evaluation in person at Urgent Care or with your PCP.    If you have been instructed to have an in-person evaluation today at a local Urgent Care facility, please use the link below. It will take you to a list of all of our available [facility], including address, phone number and hours of operation. Please do not delay care.  [facility]  If you or a family member do not have a primary care provider, use the link below to schedule a visit and establish care. When you choose a [facility] primary care physician or advanced practice provider, you gain a long-term partner in health. Find a Primary Care Provider  Learn more about [facility] in-office and virtual care options: [facility]

## 2022-07-13 NOTE — Progress Notes (Signed)
Virtual Visit Consent   [name], you are scheduled for a virtual visit with a [facility] provider today. Just as with appointments in the office, your consent must be obtained to participate. Your consent will be active for this visit and any virtual visit you may have with one of our providers in the next 365 days. If you have a MyChart account, a copy of this consent can be sent to you electronically.  As this is a virtual visit, video technology does not allow for your provider to perform a traditional examination. This may limit your provider's ability to fully assess your condition. If your provider identifies any concerns that need to be evaluated in person or the need to arrange testing (such as labs, EKG, etc.), we will make arrangements to do so. Although advances in technology are sophisticated, we cannot ensure that it will always work on either your end or our end. If the connection with a video visit is poor, the visit may have to be switched to a telephone visit. With either a video or telephone visit, we are not always able to ensure that we have a secure connection.  By engaging in this virtual visit, you consent to the provision of healthcare and authorize for your insurance to be billed (if applicable) for the services provided during this visit. Depending on your insurance coverage, you may receive a charge related to this service.  I need to obtain your verbal consent now. Are you willing to proceed with your visit today? [name] [name] has provided verbal consent on 07/13/2022 for a virtual visit (video or telephone). Lenise Arena Ward, PA-C  Date: 07/13/2022 6:30 PM  Virtual Visit via Video Note   I, Lenise Arena Ward, connected with  [name] [name]  ([id number], 1965/12/21) on 07/13/22 at  6:30 PM EST by a video-enabled telemedicine application and verified that I am speaking with the correct person using two identifiers.  Location: Patient: Virtual Visit  Location Patient: Home Provider: Virtual Visit Location Provider: Home Office   I discussed the limitations of evaluation and management by telemedicine and the availability of in person appointments. The patient expressed understanding and agreed to proceed.    History of Present Illness: [name] [name] is a 57 y.o. who identifies as a female who was assigned female at birth, and is being seen today for continued congestion, cough, chills.  She reports some shortness of breath which is relieved inhaler.  She reports the cough syrup that was prescribed at her UC visit has provided relief, but she is now out of the medication.    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   Dyslipidemia 01/30/2015   MDD (major depressive disorder), recurrent severe, 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   Essential hypertension 08/25/2014   Morbid obesity ([place]) 08/25/2014   CAD (coronary artery disease), native coronary artery 08/24/2014   Numbness and tingling in left  arm    Arm numbness left 08/22/2014   Chest pain 08/22/2014   Gastroesophageal reflux disease without esophagitis 07/23/2014   Coronary artery calcification seen on CAT scan 02/19/2014   Hip pain 10/23/2013   Solitary pulmonary nodule 08/28/2013   Chest pain, atypical 07/18/2013   Heart palpitations 06/27/2013   Cough 37/85/8850   Diastolic dysfunction  27/74/1287   SOB (shortness of breath) 05/09/2013   Other malaise and fatigue 01/21/2013   Stress and adjustment reaction 12/09/2012   Right sided abdominal pain 11/29/2012   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/21/2012   Morbid obesity with BMI of 50.0-59.9, adult ([place]) 11/21/2012   Arthritis 07/13/2012   Depression 07/13/2012   Routine health maintenance 05/07/2012   Migraine 06/07/1998    Allergies:  Allergies  Allergen Reactions   Influenza Vaccines Anaphylaxis   Influenza Virus Vaccine Anaphylaxis    Other reaction(s): Unknown   Amitriptyline    Isosorbide     Severe headaches   Medications:  Current Outpatient Medications:    promethazine-dextromethorphan (PROMETHAZINE-DM) 6.25-15 MG/5ML syrup, Take 5 mLs by mouth 4 (four) times daily as needed for cough., Disp: 118 mL, Rfl: 0   Abatacept (ORENCIA IV), Inject 1,000 mg into the vein every 28 (twenty-eight) days., Disp: , Rfl:    acetaminophen-codeine (TYLENOL #3) 300-30 MG tablet, Take 1 tablet by mouth every 6 (six) hours as needed for moderate pain., Disp: 10 tablet, Rfl: 0   acyclovir ointment (ZOVIRAX) 5 %, as needed., Disp: , Rfl:    Acyclovir-Hydrocortisone (XERESE) 5-1 % CREA, as needed., Disp: , Rfl:    Adapalene 0.3 % gel, Apply 1 application topically daily as needed (acne). , Disp: , Rfl:    Alcohol Swabs (ALCOHOL WIPES) 70 % PADS, Alcohol Prep Pads, Disp: , Rfl:    ARIPiprazole (ABILIFY) 2 MG tablet, Take 2 mg by mouth daily., Disp: , Rfl:    aspirin EC 81 MG tablet, Take 1 tablet (81 mg total) by mouth daily. Swallow whole., Disp: 90 tablet, Rfl: 3   benzonatate (TESSALON) 100 MG capsule, Take 1 capsule (100 mg total) by mouth 2 (two) times daily as needed for cough., Disp: 20 capsule, Rfl: 0   betamethasone, augmented, (DIPROLENE) 0.05 % lotion, Apply topically as needed., Disp: , Rfl:    buPROPion (WELLBUTRIN XL) 150 MG 24 hr tablet, Take 450 mg by mouth every morning., Disp: , Rfl:    buPROPion  (WELLBUTRIN XL) 150 MG 24 hr tablet, Take 3 tablets by mouth every morning., Disp: , Rfl:    cetirizine (ZYRTEC ALLERGY) 10 MG tablet, Take 1 tablet (10 mg total) by mouth daily., Disp: 30 tablet, Rfl: 0   Cetirizine HCl (ZYRTEC ALLERGY PO), 10 mg as needed., Disp: , Rfl:    cetirizine-pseudoephedrine (ZYRTEC-D) 5-120 MG tablet, Take 1 tablet by mouth 2 (two) times daily. (Patient not taking: Reported on 01/27/2022), Disp: 30 tablet, Rfl: 0   ciclopirox (LOPROX) 0.77 % cream, as needed., Disp: , Rfl:    Ciclopirox 1 % shampoo, Apply topically as needed., Disp: , Rfl:    cyclobenzaprine (FLEXERIL) 5 MG tablet, TAKE 1 TABLET BY MOUTH AT BEDTIME AS NEEDED., Disp: 30 tablet, Rfl: 2   diclofenac Sodium (VOLTAREN) 1 % GEL, Apply 2-4 grams to affected joint 4 times daily as needed., Disp: 400 g, Rfl: 2   diltiazem (CARDIZEM CD) 180 MG 24 hr capsule, Take 1 capsule (180 mg total) by mouth daily., Disp: 90 capsule, Rfl: 3   doxepin (SINEQUAN) 25 MG  capsule, Take 25 mg by mouth at bedtime as needed., Disp: , Rfl:    EPINEPHrine 0.3 mg/0.3 mL IJ SOAJ injection, , Disp: , Rfl:    estradiol (ESTRACE) 0.5 MG tablet, Take 0.5 mg by mouth daily., Disp: , Rfl:    ezetimibe (ZETIA) 10 MG tablet, Take 1 tablet (10 mg total) by mouth daily., Disp: 90 tablet, Rfl: 3   famciclovir (FAMVIR) 500 MG tablet, Take 15,000 mg by mouth daily as needed (fever blister). , Disp: , Rfl: 11   famotidine (PEPCID) 20 MG tablet, Take 20 mg by mouth as needed., Disp: , Rfl:    famotidine (PEPCID) 20 MG tablet, daily., Disp: , Rfl:    fluticasone (FLONASE) 50 MCG/ACT nasal spray, Place 1 spray into both nostrils daily for 3 days., Disp: 16 g, Rfl: 0   fluticasone (FLONASE) 50 MCG/ACT nasal spray, as needed., Disp: , Rfl:    Folic Acid-Vit U9-WJX B14 (FABB) 2.2-25-1 MG TABS, Take 1 tablet by mouth daily., Disp: 90 tablet, Rfl: 2   gabapentin (NEURONTIN) 300 MG capsule, TAKE 1 CAPSULE BY MOUTH THREE TIMES A DAY AS NEEDED FOR PAIN, Disp: 90  capsule, Rfl: 2   hydrocortisone 2.5 % cream, SMARTSIG:1 Topical Every Night, Disp: , Rfl:    hydrOXYzine (VISTARIL) 25 MG capsule, Take 25 mg by mouth as needed., Disp: , Rfl:    ibuprofen (ADVIL) 600 MG tablet, Take 1 tablet (600 mg total) by mouth every 6 (six) hours as needed., Disp: 30 tablet, Rfl: 0   ketoconazole (NIZORAL) 2 % cream, , Disp: , Rfl:    ketoconazole (NIZORAL) 2 % shampoo, , Disp: , Rfl:    levothyroxine (SYNTHROID) 25 MCG tablet, as needed., Disp: , Rfl:    lidocaine (LIDODERM) 5 %, Apply 1 patch topically as needed., Disp: , Rfl:    lidocaine (LIDODERM) 5 %, APPLY 1 PATCH TO AFFECTED AREA FOR 12 HOURS IN A 24 HOUR PERIOD, Disp: 30 patch, Rfl: 2   LINZESS 145 MCG CAPS capsule, Take 145 mcg by mouth daily., Disp: , Rfl:    LORazepam (ATIVAN) 1 MG tablet, Take 1 tablet (1 mg total) by mouth 3 (three) times daily as needed for anxiety., Disp: 15 tablet, Rfl: 0   metoprolol tartrate (LOPRESSOR) 100 MG tablet, Take 1 tablet (100 mg total) by mouth once for 1 dose. Take 1 tablet (100 mg total) two hours prior to CT scan., Disp: 1 tablet, Rfl: 0   neomycin-polymyxin-hydrocortisone (CORTISPORIN) 3.5-10000-1 OTIC suspension, as needed., Disp: , Rfl:    nitroGLYCERIN (NITROSTAT) 0.4 MG SL tablet, PLACE 1 TABLET UNDER THE TONGUE EVERY 5 MINUTES AS NEEDED FOR CHEST PAIN., Disp: 25 tablet, Rfl: 3   NYAMYC powder, Apply topically., Disp: , Rfl:    nystatin cream (MYCOSTATIN), Apply topically., Disp: , Rfl:    Oxymetazoline HCl (NASAL SPRAY) 0.05 % SOLN, Nasal Decongestant (oxymetazoline) 0.05 % spray, Disp: , Rfl:    predniSONE (DELTASONE) 20 MG tablet, Take 2 tablets daily with breakfast. (Patient not taking: Reported on 01/27/2022), Disp: 10 tablet, Rfl: 0   progesterone (PROMETRIUM) 100 MG capsule, Take 100 mg by mouth daily., Disp: , Rfl:    PROGESTERONE PO, 100 mg daily., Disp: , Rfl:    propranolol (INDERAL) 10 MG tablet, Take 10 mg by mouth daily., Disp: , Rfl:    pseudoephedrine  (SUDAFED) 60 MG tablet, Take 1 tablet (60 mg total) by mouth every 8 (eight) hours as needed for congestion., Disp: 30 tablet, Rfl: 0  RASUVO 25 MG/0.5ML SOAJ, INJECT ONE PEN SUBCUTANEOUSLY ONCE EVERY WEEK. STORE AT ROOM TEMPERATURE BETWEEN 68 - 77 DEGREES F., Disp: 6 mL, Rfl: 0   rizatriptan (MAXALT) 10 MG tablet, Take 1 tablet (10 mg total) by mouth as needed for migraine. May repeat in 2 hours if needed (Patient not taking: Reported on 01/27/2022), Disp: 10 tablet, Rfl: 0   rosuvastatin (CRESTOR) 40 MG tablet, Take 1 tablet (40 mg total) by mouth daily., Disp: 90 tablet, Rfl: 3   triamcinolone (KENALOG) 0.1 % paste, Use as directed 1 application. in the mouth or throat 2 (two) times daily. Apply to mouth sores, Disp: 5 g, Rfl: 12   Triamcinolone Acetonide 0.025 % LOTN, as needed., Disp: , Rfl:    valACYclovir (VALTREX) 500 MG tablet, Take 500 mg by mouth as needed., Disp: , Rfl:    Vitamin D, Ergocalciferol, (DRISDOL) 1.25 MG (50000 UT) CAPS capsule, Take 50,000 Units by mouth See admin instructions. Take 1 tablet (50000 units) by mouth every Monday, Wednesday, Saturday, Disp: , Rfl:   Current Facility-Administered Medications:    ipratropium (ATROVENT) 0.03 % nasal spray 2 spray, 2 spray, Each Nare, TID, Ward, Tylene Fantasia, PA-C  Observations/Objective: Patient is well-developed, well-nourished in no acute distress.  Resting comfortably at home.  Head is normocephalic, atraumatic.  No labored breathing.  Speech is clear and coherent with logical content.  Patient is alert and oriented at baseline.    Assessment and Plan: 1. COVID - promethazine-dextromethorphan (PROMETHAZINE-DM) 6.25-15 MG/5ML syrup; Take 5 mLs by mouth 4 (four) times daily as needed for cough.  Dispense: 118 mL; Refill: 0 - ipratropium (ATROVENT) 0.03 % nasal spray 2 spray  Pt speaking in complete sentences, in no acute distress.  Advised in person evaluation is symptoms become worse.   Follow Up Instructions: I  discussed the assessment and treatment plan with the patient. The patient was provided an opportunity to ask questions and all were answered. The patient agreed with the plan and demonstrated an understanding of the instructions.  A copy of instructions were sent to the patient via MyChart unless otherwise noted below.     The patient was advised to call back or seek an in-person evaluation if the symptoms worsen or if the condition fails to improve as anticipated.  Time:  I spent 7 minutes with the patient via telehealth technology discussing the above problems/concerns.    Tylene Fantasia Ward, PA-C

## 2022-07-15 ENCOUNTER — Inpatient Hospital Stay (HOSPITAL_COMMUNITY): Admission: RE | Admit: 2022-07-15 | Payer: BC Managed Care – PPO | Source: Ambulatory Visit

## 2022-07-18 ENCOUNTER — Other Ambulatory Visit: Payer: Self-pay | Admitting: Rheumatology

## 2022-07-19 NOTE — Telephone Encounter (Signed)
Next Visit: 08/05/2022    Last Visit: 01/27/2022    Last Fill: 05/03/2022   Dx: Fibromyalgia    Current Dose per office note on 01/27/2022: not discussed   Okay to refill Flexeril?

## 2022-07-22 NOTE — Progress Notes (Deleted)
Office Visit Note  Patient: [name] [name]             Date of Birth: 1966/05/24           MRN: [id number]             PCP: Jonathon Resides, MD (Inactive) Referring: Jonathon Resides, MD Visit Date: 08/05/2022 Occupation: @GUAROCC$ @  Subjective:  No chief complaint on file.   History of Present Illness: [name] [name] is a 57 y.o. female ***     Activities of Daily Living:  Patient reports morning stiffness for *** {minute/hour:19697}.   Patient {ACTIONS;DENIES/REPORTS:21021675::"Denies"} nocturnal pain.  Difficulty dressing/grooming: {ACTIONS;DENIES/REPORTS:21021675::"Denies"} Difficulty climbing stairs: {ACTIONS;DENIES/REPORTS:21021675::"Denies"} Difficulty getting out of chair: {ACTIONS;DENIES/REPORTS:21021675::"Denies"} Difficulty using hands for taps, buttons, cutlery, and/or writing: {ACTIONS;DENIES/REPORTS:21021675::"Denies"}  No Rheumatology ROS completed.   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   Dyslipidemia 01/30/2015   MDD (major depressive disorder), recurrent severe, without psychosis ([place]) 12/22/2014   Essential hypertension 08/25/2014   Morbid obesity ([place]) 08/25/2014   CAD (coronary artery disease), native coronary artery 08/24/2014   Numbness and tingling in left arm    Arm numbness left 08/22/2014   Chest pain 08/22/2014   Gastroesophageal reflux disease without esophagitis  07/23/2014   Coronary artery calcification seen on CAT scan 02/19/2014   Hip pain 10/23/2013   Solitary pulmonary nodule 08/28/2013   Chest pain, atypical 07/18/2013   Heart palpitations 06/27/2013   Cough [id number]   Diastolic dysfunction 123456   SOB (shortness of breath) 05/09/2013   Other malaise and fatigue 01/21/2013   Stress and adjustment reaction 12/09/2012   Right sided abdominal pain 11/29/2012   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/21/2012   Morbid obesity with BMI of 50.0-59.9, adult ([place]) 11/21/2012   Arthritis 07/13/2012   Depression 07/13/2012   Routine health maintenance 05/07/2012   Migraine 06/07/1998    Past Medical History:  Diagnosis Date   Anxiety    Bursitis    CAD (coronary artery disease), native coronary artery    Coronary CTA showed minimal calcified plaque in the proximal and mid LAD less than 25%, mild left atrial enlargement and a coronary calcium score to 14 which is 99th percentile for age and sex matched controls by coronary CTA 10-2021   Depression    Diastolic dysfunction    Dyslipidemia 01/30/2015   Esophageal ring    Fibromyalgia    Hiatal hernia    HSV-1 infection    Morbid obesity ([place])    Osteoarthritis    Rheumatoid arthritis(714.0)    M05.79   Rheumatoid arthritis, seropositive, 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[place])    Treated with Orencia, TB neg 09/12/2015   Spondylolysis     Family History  Problem Relation Age of Onset   Hyperlipidemia Mother    Depression Mother    Sarcoidosis Father    Lung disease Father        Pleural Mesothelioma   Cancer Father  Heart attack Paternal Grandmother    Hypertension Paternal Grandmother    Hypertension Maternal Grandmother    Diabetes Maternal Grandmother    Stroke Neg Hx    Colon cancer Neg Hx    Esophageal cancer Neg Hx    Rectal cancer Neg Hx    Stomach cancer Neg Hx    Past Surgical History:  Procedure Laterality Date   CARDIAC CATHETERIZATION N/A 06/11/2016   Procedure:  Left Heart Cath and Coronary Angiography;  Surgeon: Jettie Booze, MD;  Location: [facility];  Service: Cardiovascular;  Laterality: N/A;   CESAREAN SECTION     COLONOSCOPY  07/27/2017   ESOPHAGEAL MANOMETRY N/A 10/28/2014   Procedure: ESOPHAGEAL MANOMETRY (EM);  Surgeon: Irene Shipper, MD;  Location: [facility];  Service: Endoscopy;  Laterality: N/A;   HERNIA REPAIR     reports surgery on 3 hernias, with 2 more present   LAPAROSCOPIC GASTRIC SLEEVE RESECTION  11/21/12   [facility]   LEFT HEART CATHETERIZATION WITH CORONARY ANGIOGRAM N/A 08/26/2014   Procedure: LEFT HEART CATHETERIZATION WITH CORONARY ANGIOGRAM;  Surgeon: Lorretta Harp, MD;  Location: [facility];  Service: Cardiovascular;  Laterality: N/A;   TUBAL LIGATION     Social History   Social History Narrative   Marital Status: Married Aeronautical engineer)    Children:  Son Marcello Moores) Daughter Margreta Journey)    Pets: None    Living Situation: Lives with husband and children    Occupation: Radiation protection practitioner Scientist, clinical (histocompatibility and immunogenetics))     Education: Dietitian (Psychology)     Tobacco Use/Exposure:  None    Alcohol Use:  Occasional   Drug Use:  None   Diet:  Regular   Exercise: Walking or Treadmill (2 x week)   Hobbies: Barrister's clerk, Christmas Decorations.               Immunization History  Administered Date(s) Administered   PFIZER(Purple Top)SARS-COV-2 Vaccination 08/30/2019, 09/20/2019   Tdap 09/21/2016     Objective: Vital Signs: There were no vitals taken for this visit.   Physical Exam   Musculoskeletal Exam: ***  CDAI Exam: CDAI Score: -- Patient Global: --; Provider Global: -- Swollen: --; Tender: -- Joint Exam 08/05/2022   No joint exam has been documented for this visit   There is currently no information documented on the homunculus. Go to the Rheumatology activity and complete the homunculus joint exam.  Investigation: No additional findings.  Imaging: DG Chest 2 View  Result Date:  07/07/2022 CLINICAL DATA:  Cough, shortness of breath, COVID positive EXAM: CHEST - 2 VIEW COMPARISON:  07/02/2022 FINDINGS: The heart size and mediastinal contours are within normal limits. Both lungs are clear. The visualized skeletal structures are unremarkable. IMPRESSION: No active cardiopulmonary disease. Electronically Signed   By: Rolm Baptise M.D.   On: 07/07/2022 12:05    Recent Labs: Lab Results  Component Value Date   WBC 10.1 05/20/2022   HGB 13.5 05/20/2022   PLT 289 05/20/2022   NA 143 05/20/2022   K 3.9 05/20/2022   CL 109 05/20/2022   CO2 27 05/20/2022   GLUCOSE 83 05/20/2022   BUN 11 05/20/2022   CREATININE 0.98 05/20/2022   BILITOT 0.6 05/20/2022   ALKPHOS 68 05/20/2022   AST 25 05/20/2022   ALT 17 05/20/2022   PROT 6.8 05/20/2022   ALBUMIN 3.3 (L) 05/20/2022   CALCIUM 9.1 05/20/2022   GFRAA >60 12/26/2019   QFTBGOLD Negative 09/01/2016   QFTBGOLDPLUS Negative 08/12/2021  Speciality Comments: ORENCIA 1000 mg x 4 weeks  Procedures:  No procedures performed Allergies: Influenza vaccines, Influenza virus vaccine, Amitriptyline, and Isosorbide   Assessment / Plan:     Visit Diagnoses: Rheumatoid arthritis involving multiple sites with positive rheumatoid factor (HCC)  High risk medication use  Primary osteoarthritis of both knees  Trochanteric bursitis of both hips  Fibromyalgia  Other insomnia  Other fatigue  History of hypertension  Dyslipidemia  History of coronary artery disease  History of diastolic dysfunction  Solitary pulmonary nodule  History of depression  History of vitamin D deficiency  S/P laparoscopic sleeve gastrectomy  Orders: No orders of the defined types were placed in this encounter.  No orders of the defined types were placed in this encounter.   Face-to-face time spent with patient was *** minutes. Greater than 50% of time was spent in counseling and coordination of care.  Follow-Up Instructions: No  follow-ups on file.   Ofilia Neas, PA-C  Note - This record has been created using Dragon software.  Chart creation errors have been sought, but may not always  have been located. Such creation errors do not reflect on  the standard of medical care.

## 2022-07-26 ENCOUNTER — Emergency Department (HOSPITAL_COMMUNITY): Payer: BC Managed Care – PPO

## 2022-07-26 ENCOUNTER — Encounter (HOSPITAL_COMMUNITY): Payer: Self-pay | Admitting: Emergency Medicine

## 2022-07-26 ENCOUNTER — Emergency Department (HOSPITAL_COMMUNITY)
Admission: EM | Admit: 2022-07-26 | Discharge: 2022-07-26 | Disposition: A | Discharge status: Home / Self Care | Payer: BC Managed Care – PPO | Attending: Emergency Medicine | Admitting: Emergency Medicine

## 2022-07-26 ENCOUNTER — Other Ambulatory Visit: Payer: Self-pay

## 2022-07-26 DIAGNOSIS — R110 Nausea: Secondary | ICD-10-CM | POA: Insufficient documentation

## 2022-07-26 DIAGNOSIS — R0602 Shortness of breath: Secondary | ICD-10-CM | POA: Diagnosis not present

## 2022-07-26 DIAGNOSIS — Z7982 Long term (current) use of aspirin: Secondary | ICD-10-CM | POA: Insufficient documentation

## 2022-07-26 DIAGNOSIS — I10 Essential (primary) hypertension: Secondary | ICD-10-CM | POA: Insufficient documentation

## 2022-07-26 DIAGNOSIS — R079 Chest pain, unspecified: Secondary | ICD-10-CM | POA: Diagnosis not present

## 2022-07-26 DIAGNOSIS — I2510 Atherosclerotic heart disease of native coronary artery without angina pectoris: Secondary | ICD-10-CM | POA: Diagnosis not present

## 2022-07-26 DIAGNOSIS — Z79899 Other long term (current) drug therapy: Secondary | ICD-10-CM | POA: Insufficient documentation

## 2022-07-26 DIAGNOSIS — R42 Dizziness and giddiness: Secondary | ICD-10-CM | POA: Diagnosis not present

## 2022-07-26 LAB — BASIC METABOLIC PANEL
Anion gap: 8 (ref 5–15)
BUN: 19 mg/dL (ref 6–20)
CO2: 27 mmol/L (ref 22–32)
Calcium: 8.4 mg/dL — ABNORMAL LOW (ref 8.9–10.3)
Chloride: 108 mmol/L (ref 98–111)
Creatinine, Ser: 1.09 mg/dL — ABNORMAL HIGH (ref 0.44–1.00)
GFR, Estimated: >60 mL/min (ref >60)
Glucose, Bld: 93 mg/dL (ref 70–99)
Potassium: 3.8 mmol/L (ref 3.5–5.1)
Sodium: 143 mmol/L (ref 135–145)

## 2022-07-26 LAB — CBC
HCT: 43.5 % (ref 36.0–46.0)
Hemoglobin: 13.3 g/dL (ref 12.0–15.0)
MCH: 26.7 pg (ref 26.0–34.0)
MCHC: 30.6 g/dL (ref 30.0–36.0)
MCV: 87.2 fL (ref 80.0–100.0)
Platelets: 248 10*3/uL (ref 150–400)
RBC: 4.99 MIL/uL (ref 3.87–5.11)
RDW: 13.2 % (ref 11.5–15.5)
WBC: 8.9 10*3/uL (ref 4.0–10.5)
nRBC: 0 % (ref 0.0–0.2)

## 2022-07-26 LAB — TROPONIN I (HIGH SENSITIVITY)
Troponin I (High Sensitivity): 3 ng/L (ref <18)
Troponin I (High Sensitivity): 2 ng/L (ref <18)

## 2022-07-26 MED ORDER — ALUM & MAG HYDROXIDE-SIMETH 200-200-20 MG/5ML PO SUSP
30.0000 mL | Freq: Once | ORAL | Status: AC
  Administered 2022-07-26: 30 mL via ORAL
  Filled 2022-07-26: qty 30

## 2022-07-26 MED ORDER — IBUPROFEN 600 MG PO TABS: 600.0000 mg | ORAL_TABLET | Freq: Three times a day (TID) | ORAL | 0 refills | Status: AC | PRN

## 2022-07-26 MED ORDER — STERILE WATER FOR INJECTION IJ SOLN
INTRAMUSCULAR | Status: AC
  Filled 2022-07-26: qty 10

## 2022-07-26 MED ORDER — MORPHINE SULFATE (PF) 4 MG/ML IV SOLN
4.0000 mg | Freq: Once | INTRAVENOUS | Status: AC
  Administered 2022-07-26: 4 mg via INTRAVENOUS
  Filled 2022-07-26: qty 1

## 2022-07-26 MED ORDER — ACETAMINOPHEN 500 MG PO TABS
1000.0000 mg | ORAL_TABLET | Freq: Once | ORAL | Status: AC
  Administered 2022-07-26: 1000 mg via ORAL
  Filled 2022-07-26: qty 2

## 2022-07-26 MED ORDER — IOHEXOL 350 MG/ML SOLN
100.0000 mL | Freq: Once | INTRAVENOUS | Status: AC | PRN
  Administered 2022-07-26: 100 mL via INTRAVENOUS

## 2022-07-26 MED ORDER — KETOROLAC TROMETHAMINE 15 MG/ML IJ SOLN
15.0000 mg | Freq: Once | INTRAMUSCULAR | Status: AC
  Administered 2022-07-26: 15 mg via INTRAVENOUS
  Filled 2022-07-26: qty 1

## 2022-07-26 NOTE — ED Notes (Signed)
EKG given to Dr. Mayra Neer

## 2022-07-26 NOTE — ED Notes (Signed)
CT contacted for update on CT angio and said she will be coming back soon a few other patients ahead

## 2022-07-26 NOTE — Discharge Instructions (Addendum)
Thank you for coming to [facility] Emergency Department. You were seen for chest pain. We did an exam, labs, and imaging, and these showed no acute findings. You have a stable lung nodule that is benign, fatty liver, and a hiatal hernia. Please continue to take your famotidine at home.   Please follow up with your primary care provider within 1 week. You can alternate taking Tylenol and ibuprofen as needed for pain. You can take 634m tylenol (acetaminophen) every 4-6 hours, and 600 mg ibuprofen 3 times a day.   Do not hesitate to return to the ED or call 911 if you experience: -Worsening symptoms -Shortness of breath, palpitations, chest pain -Lightheadedness, passing out -Fevers/chills -Anything else that concerns you

## 2022-07-26 NOTE — ED Notes (Signed)
Dc instructions and scripts reviewed with pt no questions or concerns at this time. Will follow up . Ambulated out of ed with steady gait. Husband to transport pt home.

## 2022-07-26 NOTE — ED Triage Notes (Signed)
BIB [facility] for sudden onset of dizziness, shob, cp, radiates to back and jaw. Pt denies any cardiac or pulmonary history 140/90 BP 89 cbg 1 nitro give en route, pain improved 324 mg ASA

## 2022-07-26 NOTE — ED Provider Notes (Signed)
[facility] Provider Note   CSN: [id number] Arrival date & time: 07/26/22  1540     History  Chief Complaint  Patient presents with   Chest Pain    [name] [name] is a 57 y.o. female with HTN, CAD, diastolic dysfunction, GERD, obesity, migraines, HLD, fibromyalgia, RA, history of partial gastrectomy, who presents with chest pain.   BIB [facility] for sudden onset of dizziness, SOB, L sided CP that radiates down L arm and up into L side of neck/jaw that started at 12 pm. Has had similar pain before but never this severe. Also radiates into back some. A/w nausea no vomiting. Pt denies any cardiac or pulmonary history. Endorses intermittent abdominal cramping that is not new for her. Denies headache, f/c, recent illnesses, leg swelling. EMS vitals are 140/90 BP, 89 CBG. 1 nitro give en route, pain improved. Also was given 324 mg ASA en route. States now the nitro has worn off some and her pain has returned to 7/10.    Chest Pain  Chest Pain      Home Medications Prior to Admission medications   Medication Sig Start Date End Date Taking? Authorizing Provider  ibuprofen (ADVIL) 600 MG tablet Take 1 tablet (600 mg total) by mouth every 8 (eight) hours as needed for up to 14 days for mild pain or moderate pain. 07/26/22 08/09/22 Yes Audley Hose, MD  Abatacept (ORENCIA IV) Inject 1,000 mg into the vein every 28 (twenty-eight) days.    Bo Merino, MD  acetaminophen-codeine (TYLENOL #3) 300-30 MG tablet Take 1 tablet by mouth every 6 (six) hours as needed for moderate pain. 09/21/21   Pete Pelt, PA-C  acyclovir ointment (ZOVIRAX) 5 % as needed.    [provider]  Acyclovir-Hydrocortisone (XERESE) 5-1 % CREA as needed.    [provider]  Adapalene 0.3 % gel Apply 1 application topically daily as needed (acne).  12/31/14   [provider]  Alcohol Swabs (ALCOHOL WIPES) 70 % PADS Alcohol Prep Pads     [provider]  ARIPiprazole (ABILIFY) 2 MG tablet Take 2 mg by mouth daily. 09/25/21   [provider]  aspirin EC 81 MG tablet Take 1 tablet (81 mg total) by mouth daily. Swallow whole. 10/13/21   Sueanne Margarita, MD  benzonatate (TESSALON) 100 MG capsule Take 1 capsule (100 mg total) by mouth 2 (two) times daily as needed for cough. 04/15/21   Perlie Mayo, NP  betamethasone, augmented, (DIPROLENE) 0.05 % lotion Apply topically as needed. 01/23/22   [provider]  buPROPion (WELLBUTRIN XL) 150 MG 24 hr tablet Take 450 mg by mouth every morning. 05/23/19   [provider]  buPROPion (WELLBUTRIN XL) 150 MG 24 hr tablet Take 3 tablets by mouth every morning.    [provider]  cetirizine (ZYRTEC ALLERGY) 10 MG tablet Take 1 tablet (10 mg total) by mouth daily. 10/08/21   Jaynee Eagles, PA-C  Cetirizine HCl (ZYRTEC ALLERGY PO) 10 mg as needed.    [provider]  cetirizine-pseudoephedrine (ZYRTEC-D) 5-120 MG tablet Take 1 tablet by mouth 2 (two) times daily. Patient not taking: Reported on 01/27/2022 07/03/21   Chevis Pretty, FNP  ciclopirox (LOPROX) 0.77 % cream as needed.    [provider]  Ciclopirox 1 % shampoo Apply topically as needed. 01/25/22   [provider]  cyclobenzaprine (FLEXERIL) 5 MG tablet TAKE 1 TABLET BY MOUTH EVERY DAY AT BEDTIME  AS NEEDED 07/19/22   Ofilia Neas, PA-C  diclofenac Sodium (VOLTAREN) 1 % GEL Apply 2-4 grams to affected joint 4 times daily as needed. 01/27/22   Ofilia Neas, PA-C  diltiazem (CARDIZEM CD) 180 MG 24 hr capsule Take 1 capsule (180 mg total) by mouth daily. 12/15/21   Sueanne Margarita, MD  doxepin (SINEQUAN) 25 MG capsule Take 25 mg by mouth at bedtime as needed. 04/28/19   [provider]  EPINEPHrine 0.3 mg/0.3 mL IJ SOAJ injection  07/02/19   [provider]  estradiol (ESTRACE) 0.5 MG tablet Take 0.5 mg by mouth daily. 09/16/21   [provider]   ezetimibe (ZETIA) 10 MG tablet Take 1 tablet (10 mg total) by mouth daily. 12/15/21   Sueanne Margarita, MD  famciclovir (FAMVIR) 500 MG tablet Take 15,000 mg by mouth daily as needed (fever blister).  01/21/15   [provider]  famotidine (PEPCID) 20 MG tablet Take 20 mg by mouth as needed. 09/22/21   [provider]  famotidine (PEPCID) 20 MG tablet daily.    [provider]  fluticasone (FLONASE) 50 MCG/ACT nasal spray Place 1 spray into both nostrils daily for 3 days. 11/11/21 11/14/21  Teodora Medici, FNP  fluticasone (FLONASE) 50 MCG/ACT nasal spray as needed.    [provider]  Folic Acid-Vit [id number] [id number] (FABB) 2.2-25-1 MG TABS Take 1 tablet by mouth daily. 01/27/22   Ofilia Neas, PA-C  gabapentin (NEURONTIN) 300 MG capsule TAKE 1 CAPSULE BY MOUTH THREE TIMES A DAY AS NEEDED FOR PAIN 04/09/22   Bo Merino, MD  hydrocortisone 2.5 % cream SMARTSIG:1 Topical Every Night 11/06/19   [provider]  hydrOXYzine (VISTARIL) 25 MG capsule Take 25 mg by mouth as needed. 10/04/21   [provider]  ketoconazole (NIZORAL) 2 % cream  03/14/20   [provider]  ketoconazole (NIZORAL) 2 % shampoo  06/03/20   [provider]  levothyroxine (SYNTHROID) 25 MCG tablet as needed.    [provider]  lidocaine (LIDODERM) 5 % Apply 1 patch topically as needed.    [provider]  lidocaine (LIDODERM) 5 % APPLY 1 PATCH TO AFFECTED AREA FOR 12 HOURS IN A 24 HOUR PERIOD 05/21/22   Ofilia Neas, PA-C  LINZESS 145 MCG CAPS capsule Take 145 mcg by mouth daily. 01/26/22   [provider]  LORazepam (ATIVAN) 1 MG tablet Take 1 tablet (1 mg total) by mouth 3 (three) times daily as needed for anxiety. 10/09/19   Faustino Congress, NP  metoprolol tartrate (LOPRESSOR) 100 MG tablet Take 1 tablet (100 mg total) by mouth once for 1 dose. Take 1 tablet (100 mg total) two hours prior to CT scan. 10/13/21 10/13/21  Sueanne Margarita, MD   neomycin-polymyxin-hydrocortisone (CORTISPORIN) 3.5-10000-1 OTIC suspension as needed.    [provider]  nitroGLYCERIN (NITROSTAT) 0.4 MG SL tablet PLACE 1 TABLET UNDER THE TONGUE EVERY 5 MINUTES AS NEEDED FOR CHEST PAIN. 03/15/22   Sueanne Margarita, MD  [facility] powder Apply topically. 03/17/20   [provider]  nystatin cream (MYCOSTATIN) Apply topically. 01/23/22   [provider]  Oxymetazoline HCl (NASAL SPRAY) 0.05 % SOLN Nasal Decongestant (oxymetazoline) 0.05 % spray    [provider]  predniSONE (DELTASONE) 20 MG tablet Take 2 tablets daily with breakfast. Patient not taking: Reported on 01/27/2022 10/08/21   Jaynee Eagles, PA-C  progesterone (PROMETRIUM) 100 MG capsule Take 100 mg by mouth daily.  09/16/21   [provider]  PROGESTERONE PO 100 mg daily.    [provider]  promethazine-dextromethorphan (PROMETHAZINE-DM) 6.25-15 MG/5ML syrup Take 5 mLs by mouth 4 (four) times daily as needed for cough. 07/13/22   Ward, Lenise Arena, PA-C  propranolol (INDERAL) 10 MG tablet Take 10 mg by mouth daily.    [provider]  pseudoephedrine (SUDAFED) 60 MG tablet Take 1 tablet (60 mg total) by mouth every 8 (eight) hours as needed for congestion. 10/08/21   Jaynee Eagles, PA-C  RASUVO 25 MG/0.5ML SOAJ INJECT ONE PEN SUBCUTANEOUSLY ONCE EVERY WEEK. STORE AT ROOM TEMPERATURE BETWEEN 68 - 77 DEGREES F. 02/23/22   Bo Merino, MD  rizatriptan (MAXALT) 10 MG tablet Take 1 tablet (10 mg total) by mouth as needed for migraine. May repeat in 2 hours if needed Patient not taking: Reported on 01/27/2022 04/15/21   Perlie Mayo, NP  rosuvastatin (CRESTOR) 40 MG tablet Take 1 tablet (40 mg total) by mouth daily. 12/15/21   Sueanne Margarita, MD  triamcinolone (KENALOG) 0.1 % paste Use as directed 1 application. in the mouth or throat 2 (two) times daily. Apply to mouth sores 11/11/21   Teodora Medici, FNP  Triamcinolone Acetonide 0.025 % LOTN as needed.     [provider]  valACYclovir (VALTREX) 500 MG tablet Take 500 mg by mouth as needed. 09/17/21   [provider]  Vitamin D, Ergocalciferol, (DRISDOL) 1.25 MG (50000 UT) CAPS capsule Take 50,000 Units by mouth See admin instructions. Take 1 tablet (50000 units) by mouth every Monday, Wednesday, Saturday    [provider]      Allergies    Influenza vaccines, Influenza virus vaccine, Amitriptyline, and Isosorbide    Review of Systems   Review of Systems  Cardiovascular:  Positive for chest pain.   Review of systems Negative for f/c.  A 10 point review of systems was performed and is negative unless otherwise reported in HPI.  Physical Exam Updated Vital Signs BP 131/78   Pulse 60   Temp 98.3 F (36.8 C) (Oral)   Resp 18   SpO2 96%  Physical Exam General: Normal appearing female, lying in bed.  HEENT: PERRLA, Sclera anicteric, MMM, trachea midline.  Cardiology: RRR, no murmurs/rubs/gallops. BL radial and DP pulses equal bilaterally. No TTP of chest wall. No signs of trauma, crepitus. Resp: Normal respiratory rate and effort. CTAB, no wheezes, rhonchi, crackles.  Abd: Soft, non-tender, non-distended. No rebound tenderness or guarding.  GU: Deferred. MSK: No peripheral edema or signs of trauma. Extremities without deformity or TTP. No cyanosis or clubbing. Skin: warm, dry. No rashes or lesions. Back: No CVA tenderness Neuro: A&Ox4, CNs II-XII grossly intact. MAEs. Sensation grossly intact.  Psych: Normal mood and affect.   ED Results / Procedures / Treatments   Labs (all labs ordered are listed, but only abnormal results are displayed) Labs Reviewed  BASIC METABOLIC PANEL - Abnormal; Notable for the following components:      Result Value   Creatinine, Ser 1.09 (*)    Calcium 8.4 (*)    All other components within normal limits  CBC  TROPONIN I (HIGH SENSITIVITY)  TROPONIN I (HIGH SENSITIVITY)    EKG See below  Radiology CT Angio Chest Aorta  W and/or Wo Contrast  Result Date: 07/26/2022 CLINICAL DATA:  Sudden onset dizziness, shortness of breath, and radiating chest pain. EXAM: CT ANGIOGRAPHY CHEST WITH CONTRAST TECHNIQUE: Multidetector CT imaging of the chest was performed using  the standard protocol during bolus administration of intravenous contrast. Multiplanar CT image reconstructions and MIPs were obtained to evaluate the vascular anatomy. RADIATION DOSE REDUCTION: This exam was performed according to the departmental dose-optimization program which includes automated exposure control, adjustment of the mA and/or kV according to patient size and/or use of iterative reconstruction technique. CONTRAST:  173m OMNIPAQUE IOHEXOL 350 MG/ML SOLN COMPARISON:  PA Lat chest today, PA Lat 07/07/2022, coronary CTA 11/03/2021, chest CT no contrast 01/23/2015. FINDINGS: Cardiovascular: There is moderate panchamber cardiomegaly with a left chamber predominance. The central pulmonary veins are borderline prominent. There are mild calcifications in the proximal LAD and proximal circumflex coronary arteries. There is no pericardial effusion. The pulmonary arteries are normal in caliber without visible thromboembolic disease. The thoracic aorta and great vessels are unremarkable. There are no visible plaques in the thoracic aorta and great vessels. Mediastinum/Nodes: No enlarged mediastinal, hilar, or axillary lymph nodes. Thyroid gland, trachea, and esophagus demonstrate no significant findings. Moderate-sized hiatal hernia is again noted with surgical changes along the hiatus. Lungs/Pleura: Eventration and focal elevation noted in the anterior right hemidiaphragm. There is no pleural effusion, thickening or pneumothorax. There are paraspinal scarring changes again in the right lower lobe. There is scattered linear atelectasis both bases. There is a chronic 4 mm ground-glass nodule in the right middle lobe perihilar area on 7:59, stable since 2016 and presumed  benign. The lungs are clear of infiltrates and further nodules. Upper Abdomen: There is a mildly prominent liver with mild steatosis. No acute abnormality. Respiratory motion limits fine detail. Musculoskeletal: No chest wall abnormality. No acute or significant osseous findings. Review of the MIP images confirms the above findings. IMPRESSION: 1. No evidence of arterial dilatation or embolus. 2. Cardiomegaly with borderline prominent central pulmonary veins. No pulmonary edema. 3. Coronary artery atherosclerosis. 4. Hiatal hernia with surgical changes along the hiatus. 5. Mildly prominent liver with mild steatosis. 6. Chronic 4 mm ground-glass nodule in the right middle lobe, stable since 2016 and presumed benign. Electronically Signed   By: KTelford NabM.D.   On: 07/26/2022 21:16   DG Chest 2 View  Result Date: 07/26/2022 CLINICAL DATA:  Dizziness, shortness of breath and chest pain. EXAM: CHEST - 2 VIEW COMPARISON:  07/07/2022 FINDINGS: The heart size and mediastinal contours are within normal limits. There is no evidence of pulmonary edema, consolidation, pneumothorax or pleural fluid. The visualized skeletal structures are unremarkable. IMPRESSION: No active cardiopulmonary disease. Electronically Signed   By: GAletta EdouardM.D.   On: 07/26/2022 16:18    Procedures Procedures    Medications Ordered in ED Medications  sterile water (preservative free) injection (  Not Given 07/26/22 1834)  morphine (PF) 4 MG/ML injection 4 mg (4 mg Intravenous Given 07/26/22 1701)  iohexol (OMNIPAQUE) 350 MG/ML injection 100 mL (100 mLs Intravenous Contrast Given 07/26/22 2040)  alum & mag hydroxide-simeth (MAALOX/MYLANTA) 200-200-20 MG/5ML suspension 30 mL (30 mLs Oral Given 07/26/22 2244)  acetaminophen (TYLENOL) tablet 1,000 mg (1,000 mg Oral Given 07/26/22 2244)  ketorolac (TORADOL) 15 MG/ML injection 15 mg (15 mg Intravenous Given 07/26/22 2244)    ED Course/ Medical Decision Making/ A&P                           Medical Decision Making Amount and/or Complexity of Data Reviewed Labs: ordered. Decision-making details documented in ED Course. Radiology: ordered. Decision-making details documented in ED Course.  Risk OTC drugs. Prescription drug management.  This patient presents to the ED for concern of chest pain, this involves an extensive number of treatment options, and is a complaint that carries with it a high risk of complications and morbidity.  I considered the following differential and admission for this acute, potentially life threatening condition.   MDM:    DDX for chest pain includes but is not limited to:  ACS/arrhythmia,  PE, aortic dissection, esophogeal rupture, biliary disease, pericarditis, GERD/PUD/gastritis, or musculoskeletal pain. Consider ACS given presenting sx. Patient cannot PERC out based on age. Also c/f dissection w/ acute onset CP radiating to neck/back with h/o HTN - will obtain CT chest w/ contrast. No recent cough/flu-like symptoms to indicate PNA, and she has BL breath sounds so no c/f PTX. No TTP of chest wall to suggest MSK pain but states it is worse when she moves around, which could also be interpreted as positional or exertional. EKG w/o signs of stemi, no e/o pericarditis on EKG either. No abd pain to raise c/f biliary disease. Does describe the pain as burning sometimes so consider GERD/gastritis. Patient does take famotidine at home.   Clinical Course as of 07/26/22 2329  Mon Jul 26, 2022  1602 EKG w/ no signs of STEMI [HN]  1637 WBC: 8.9 [HN]  1637 Hemoglobin: 13.3 [HN]  1637 DG Chest 2 View FINDINGS: The heart size and mediastinal contours are within normal limits. There is no evidence of pulmonary edema, consolidation, pneumothorax or pleural fluid. The visualized skeletal structures are unremarkable.  IMPRESSION: No active cardiopulmonary disease.   [HN]  1713 Troponin I (High Sensitivity): 3 [HN]  [id number] Basic metabolic  panel(!) unremarkable [HN]  2108 Troponin I (High Sensitivity): 2 Repeat trop flat [HN]  2205 CT Angio Chest Aorta W and/or Wo Contrast 1. No evidence of arterial dilatation or embolus. 2. Cardiomegaly with borderline prominent central pulmonary veins. No pulmonary edema. 3. Coronary artery atherosclerosis. 4. Hiatal hernia with surgical changes along the hiatus. 5. Mildly prominent liver with mild steatosis. 6. Chronic 4 mm ground-glass nodule in the right middle lobe, stable since 2016 and presumed benign.   [HN]  2239 EKG was performed on arrival but not uploaded. Normal intervals, normal axis, no ST elevations/depressions, no repolarization abnormalities. [HN]    Clinical Course User Index [HN] Audley Hose, MD    Labs: I Ordered, and personally interpreted labs.  The pertinent results include:  those listed above  Imaging Studies ordered: I ordered imaging studies including CXR, CTA chest I independently visualized and interpreted imaging. I agree with the radiologist interpretation  Additional history obtained from chart review.    Cardiac Monitoring: The patient was maintained on a cardiac monitor.  I personally viewed and interpreted the cardiac monitored which showed an underlying rhythm of: NSR  Reevaluation: After the interventions noted above, I reevaluated the patient and found that they have :improved  Social Determinants of Health: Patient lives independently   Disposition:  Pain improved some. W/u reassuring.  Patient has imaging that is negative for any acute abnormalities.  She is informed of the other findings on her CT.  Negative repeat troponin and other workup is reassuring.  I discussed with patient the possibility of other causes of her chest pain including GERD gastritis, hiatal hernia, musculoskeletal pain.  I offered patient Tylenol Toradol and a GI cocktail with reevaluation but patient states she would like to take them and go home.  I  encouraged her to take her Pepcid as prescribed for possible GERD/gastritis pain.  I have provided patient with a prescription for ibuprofen per patient request as she states that will be cheaper for her but I informed her that it may worsen any GERD symptoms.  I advised her to follow-up with her primary care physician.  Heart score is 3.  She will follow-up within the next 1 to 2 weeks.  Discharged with discharge instructions and return precautions, all questions answered to patient satisfaction.  Co morbidities that complicate the patient evaluation  Past Medical History:  Diagnosis Date   Anxiety    Bursitis    CAD (coronary artery disease), native coronary artery    Coronary CTA showed minimal calcified plaque in the proximal and mid LAD less than 25%, mild left atrial enlargement and a coronary calcium score to 73 which is 99th percentile for age and sex matched controls by coronary CTA 10-2021   Depression    Diastolic dysfunction    Dyslipidemia 01/30/2015   Esophageal ring    Fibromyalgia    Hiatal hernia    HSV-1 infection    Morbid obesity (HCC)    Osteoarthritis    Rheumatoid arthritis(714.0)    M05.79   Rheumatoid arthritis, seropositive, 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[place])    Treated with Orencia, TB neg 09/12/2015   Spondylolysis      Medicines Meds ordered this encounter  Medications   morphine (PF) 4 MG/ML injection 4 mg   sterile water (preservative free) injection    [place] [employer] S: cabinet override   iohexol (OMNIPAQUE) 350 MG/ML injection 100 mL   alum & mag hydroxide-simeth (MAALOX/MYLANTA) 200-200-20 MG/5ML suspension 30 mL   acetaminophen (TYLENOL) tablet 1,000 mg   ketorolac (TORADOL) 15 MG/ML injection 15 mg   ibuprofen (ADVIL) 600 MG tablet    Sig: Take 1 tablet (600 mg total) by mouth every 8 (eight) hours as needed for up to 14 days for mild pain or moderate pain.    Dispense:  30 tablet    Refill:  0    I have reviewed the patients home medicines and have made  adjustments as needed  Problem List / ED Course: Problem List Items Addressed This Visit       Other   Chest pain - Primary                This note was created using dictation software, which may contain spelling or grammatical errors.    Audley Hose, MD 07/26/22 2329

## 2022-07-26 NOTE — ED Notes (Signed)
Patient transported to X-ray 

## 2022-08-02 ENCOUNTER — Encounter (HOSPITAL_BASED_OUTPATIENT_CLINIC_OR_DEPARTMENT_OTHER): Payer: Self-pay

## 2022-08-02 ENCOUNTER — Other Ambulatory Visit: Payer: Self-pay

## 2022-08-02 ENCOUNTER — Emergency Department (HOSPITAL_BASED_OUTPATIENT_CLINIC_OR_DEPARTMENT_OTHER)
Admission: EM | Admit: 2022-08-02 | Discharge: 2022-08-02 | Disposition: A | Discharge status: Home / Self Care | Payer: BC Managed Care – PPO | Attending: Emergency Medicine | Admitting: Emergency Medicine

## 2022-08-02 ENCOUNTER — Emergency Department (HOSPITAL_BASED_OUTPATIENT_CLINIC_OR_DEPARTMENT_OTHER): Payer: BC Managed Care – PPO | Admitting: Radiology

## 2022-08-02 DIAGNOSIS — Z7982 Long term (current) use of aspirin: Secondary | ICD-10-CM | POA: Insufficient documentation

## 2022-08-02 DIAGNOSIS — X501XXA Overexertion from prolonged static or awkward postures, initial encounter: Secondary | ICD-10-CM | POA: Diagnosis not present

## 2022-08-02 DIAGNOSIS — I2510 Atherosclerotic heart disease of native coronary artery without angina pectoris: Secondary | ICD-10-CM | POA: Insufficient documentation

## 2022-08-02 DIAGNOSIS — M25512 Pain in left shoulder: Secondary | ICD-10-CM | POA: Diagnosis present

## 2022-08-02 MED ORDER — HYDROCODONE-ACETAMINOPHEN 5-325 MG PO TABS
1.0000 | ORAL_TABLET | Freq: Once | ORAL | Status: AC
  Administered 2022-08-02: 1 via ORAL
  Filled 2022-08-02: qty 1

## 2022-08-02 MED ORDER — KETOROLAC TROMETHAMINE 30 MG/ML IJ SOLN
30.0000 mg | Freq: Once | INTRAMUSCULAR | Status: AC
  Administered 2022-08-02: 30 mg via INTRAMUSCULAR
  Filled 2022-08-02: qty 1

## 2022-08-02 NOTE — ED Provider Notes (Signed)
[facility] Provider Note   CSN: [id number] Arrival date & time: 08/02/22  1537     History  Chief Complaint  Patient presents with   Shoulder Pain    [name] [name] is a 57 y.o. female.   Shoulder Pain   57 year old female presents emergency department with complaints of left shoulder pain.  Patient states that she was reaching to grab her grandchild who is about to fall off the bed when she noted acute onset left shoulder pain.  Incident was said to occur this past Saturday.  Has been trying to rest, ice, elevation, muscle relaxer as well as ibuprofen at home for symptoms which has helped some.  Presents emergency department due to continued pain.  States she had appointment with orthopedics at 530 this afternoon but was unable to make appointment due to pain.  Denies any weakness or sensory deficits in affected arm.  Denies fever, chills, night sweats, chest pain, shortness of breath.  Past medical history significant for coronary artery disease, fibromyalgia, obesity, rheumatoid arthritis, dyslipidemia, major depressive disorder  Home Medications Prior to Admission medications   Medication Sig Start Date End Date Taking? Authorizing Provider  Abatacept (ORENCIA IV) Inject 1,000 mg into the vein every 28 (twenty-eight) days.    Bo Merino, MD  acetaminophen-codeine (TYLENOL #3) 300-30 MG tablet Take 1 tablet by mouth every 6 (six) hours as needed for moderate pain. 09/21/21   Pete Pelt, PA-C  acyclovir ointment (ZOVIRAX) 5 % as needed.    [provider]  Acyclovir-Hydrocortisone (XERESE) 5-1 % CREA as needed.    [provider]  Adapalene 0.3 % gel Apply 1 application topically daily as needed (acne).  12/31/14   [provider]  Alcohol Swabs (ALCOHOL WIPES) 70 % PADS Alcohol Prep Pads    [provider]  ARIPiprazole (ABILIFY) 2 MG tablet Take 2 mg by mouth daily. 09/25/21    [provider]  aspirin EC 81 MG tablet Take 1 tablet (81 mg total) by mouth daily. Swallow whole. 10/13/21   Sueanne Margarita, MD  benzonatate (TESSALON) 100 MG capsule Take 1 capsule (100 mg total) by mouth 2 (two) times daily as needed for cough. 04/15/21   Perlie Mayo, NP  betamethasone, augmented, (DIPROLENE) 0.05 % lotion Apply topically as needed. 01/23/22   [provider]  buPROPion (WELLBUTRIN XL) 150 MG 24 hr tablet Take 450 mg by mouth every morning. 05/23/19   [provider]  buPROPion (WELLBUTRIN XL) 150 MG 24 hr tablet Take 3 tablets by mouth every morning.    [provider]  cetirizine (ZYRTEC ALLERGY) 10 MG tablet Take 1 tablet (10 mg total) by mouth daily. 10/08/21   Jaynee Eagles, PA-C  Cetirizine HCl (ZYRTEC ALLERGY PO) 10 mg as needed.    [provider]  cetirizine-pseudoephedrine (ZYRTEC-D) 5-120 MG tablet Take 1 tablet by mouth 2 (two) times daily. Patient not taking: Reported on 01/27/2022 07/03/21   Chevis Pretty, FNP  ciclopirox (LOPROX) 0.77 % cream as needed.    [provider]  Ciclopirox 1 % shampoo Apply topically as needed. 01/25/22   [provider]  cyclobenzaprine (FLEXERIL) 5 MG tablet TAKE 1 TABLET BY MOUTH EVERY DAY AT BEDTIME AS NEEDED 07/19/22   Ofilia Neas, PA-C  diclofenac Sodium (VOLTAREN) 1 % GEL Apply 2-4 grams to affected joint 4 times daily as needed. 01/27/22   Ofilia Neas, PA-C  diltiazem (CARDIZEM CD) 180  MG 24 hr capsule Take 1 capsule (180 mg total) by mouth daily. 12/15/21   Sueanne Margarita, MD  doxepin (SINEQUAN) 25 MG capsule Take 25 mg by mouth at bedtime as needed. 04/28/19   [provider]  EPINEPHrine 0.3 mg/0.3 mL IJ SOAJ injection  07/02/19   [provider]  estradiol (ESTRACE) 0.5 MG tablet Take 0.5 mg by mouth daily. 09/16/21   [provider]  ezetimibe (ZETIA) 10 MG tablet Take 1 tablet (10 mg total) by mouth daily. 12/15/21   Sueanne Margarita, MD  famciclovir (FAMVIR) 500 MG tablet Take 15,000 mg by mouth daily as needed (fever blister).  01/21/15   [provider]  famotidine (PEPCID) 20 MG tablet Take 20 mg by mouth as needed. 09/22/21   [provider]  famotidine (PEPCID) 20 MG tablet daily.    [provider]  fluticasone (FLONASE) 50 MCG/ACT nasal spray Place 1 spray into both nostrils daily for 3 days. 11/11/21 11/14/21  Teodora Medici, FNP  fluticasone (FLONASE) 50 MCG/ACT nasal spray as needed.    [provider]  Folic Acid-Vit Q000111Q [id number] (FABB) 2.2-25-1 MG TABS Take 1 tablet by mouth daily. 01/27/22   Ofilia Neas, PA-C  gabapentin (NEURONTIN) 300 MG capsule TAKE 1 CAPSULE BY MOUTH THREE TIMES A DAY AS NEEDED FOR PAIN 04/09/22   Bo Merino, MD  hydrocortisone 2.5 % cream SMARTSIG:1 Topical Every Night 11/06/19   [provider]  hydrOXYzine (VISTARIL) 25 MG capsule Take 25 mg by mouth as needed. 10/04/21   [provider]  ibuprofen (ADVIL) 600 MG tablet Take 1 tablet (600 mg total) by mouth every 8 (eight) hours as needed for up to 14 days for mild pain or moderate pain. 07/26/22 08/09/22  Audley Hose, MD  ketoconazole (NIZORAL) 2 % cream  03/14/20   [provider]  ketoconazole (NIZORAL) 2 % shampoo  06/03/20   [provider]  levothyroxine (SYNTHROID) 25 MCG tablet as needed.    [provider]  lidocaine (LIDODERM) 5 % Apply 1 patch topically as needed.    [provider]  lidocaine (LIDODERM) 5 % APPLY 1 PATCH TO AFFECTED AREA FOR 12 HOURS IN A 24 HOUR PERIOD 05/21/22   Ofilia Neas, PA-C  LINZESS 145 MCG CAPS capsule Take 145 mcg by mouth daily. 01/26/22   [provider]  LORazepam (ATIVAN) 1 MG tablet Take 1 tablet (1 mg total) by mouth 3 (three) times daily as needed for anxiety. 10/09/19   Faustino Congress, NP  metoprolol tartrate (LOPRESSOR) 100 MG tablet Take 1 tablet (100 mg total) by mouth once for 1 dose.  Take 1 tablet (100 mg total) two hours prior to CT scan. 10/13/21 10/13/21  Sueanne Margarita, MD  neomycin-polymyxin-hydrocortisone (CORTISPORIN) 3.5-10000-1 OTIC suspension as needed.    [provider]  nitroGLYCERIN (NITROSTAT) 0.4 MG SL tablet PLACE 1 TABLET UNDER THE TONGUE EVERY 5 MINUTES AS NEEDED FOR CHEST PAIN. 03/15/22   Sueanne Margarita, MD  [facility] powder Apply topically. 03/17/20   [provider]  nystatin cream (MYCOSTATIN) Apply topically. 01/23/22   [provider]  Oxymetazoline HCl (NASAL SPRAY) 0.05 % SOLN Nasal Decongestant (oxymetazoline) 0.05 % spray    [provider]  predniSONE (DELTASONE) 20 MG tablet Take 2 tablets daily with breakfast. Patient not taking: Reported on 01/27/2022 10/08/21   Jaynee Eagles, PA-C  progesterone (PROMETRIUM) 100 MG capsule Take 100 mg by mouth daily. 09/16/21  [provider]  PROGESTERONE PO 100 mg daily.    [provider]  promethazine-dextromethorphan (PROMETHAZINE-DM) 6.25-15 MG/5ML syrup Take 5 mLs by mouth 4 (four) times daily as needed for cough. 07/13/22   Ward, Lenise Arena, PA-C  propranolol (INDERAL) 10 MG tablet Take 10 mg by mouth daily.    [provider]  pseudoephedrine (SUDAFED) 60 MG tablet Take 1 tablet (60 mg total) by mouth every 8 (eight) hours as needed for congestion. 10/08/21   Jaynee Eagles, PA-C  RASUVO 25 MG/0.5ML SOAJ INJECT ONE PEN SUBCUTANEOUSLY ONCE EVERY WEEK. STORE AT ROOM TEMPERATURE BETWEEN 68 - 77 DEGREES F. 02/23/22   Bo Merino, MD  rizatriptan (MAXALT) 10 MG tablet Take 1 tablet (10 mg total) by mouth as needed for migraine. May repeat in 2 hours if needed Patient not taking: Reported on 01/27/2022 04/15/21   Perlie Mayo, NP  rosuvastatin (CRESTOR) 40 MG tablet Take 1 tablet (40 mg total) by mouth daily. 12/15/21   Sueanne Margarita, MD  triamcinolone (KENALOG) 0.1 % paste Use as directed 1 application. in the mouth or throat 2 (two) times daily. Apply to  mouth sores 11/11/21   Teodora Medici, FNP  Triamcinolone Acetonide 0.025 % LOTN as needed.    [provider]  valACYclovir (VALTREX) 500 MG tablet Take 500 mg by mouth as needed. 09/17/21   [provider]  Vitamin D, Ergocalciferol, (DRISDOL) 1.25 MG (50000 UT) CAPS capsule Take 50,000 Units by mouth See admin instructions. Take 1 tablet (50000 units) by mouth every Monday, Wednesday, Saturday    [provider]      Allergies    Influenza vaccines, Influenza virus vaccine, Amitriptyline, and Isosorbide    Review of Systems   Review of Systems  All other systems reviewed and are negative.   Physical Exam Updated Vital Signs BP (!) 149/103   Pulse 87   Temp (!) 97.5 F (36.4 C) (Oral)   Resp 18   Ht '5\' 2"'$  (1.575 m)   Wt 123.4 kg   SpO2 95%   BMI 49.75 kg/m  Physical Exam Vitals and nursing note reviewed.  Constitutional:      General: She is not in acute distress.    Appearance: She is well-developed.  HENT:     Head: Normocephalic and atraumatic.  Eyes:     Conjunctiva/sclera: Conjunctivae normal.  Cardiovascular:     Rate and Rhythm: Normal rate and regular rhythm.     Heart sounds: No murmur heard. Pulmonary:     Effort: Pulmonary effort is normal. No respiratory distress.     Breath sounds: Normal breath sounds.  Abdominal:     Palpations: Abdomen is soft.     Tenderness: There is no abdominal tenderness.  Musculoskeletal:        General: No swelling.     Cervical back: Neck supple.     Comments: Patient with limited range of motion of left shoulder secondary to pain.  Unable to perform liftoff, empty can test, Jobe's test secondary to pain.  No overlying skin abnormalities appreciated.  No bony tenderness of humerus, clavicle, scapula.  Diffuse muscular tenderness of left trapezius as well as left deltoid.  No midline tenderness of the cervical, thoracic, lumbar spine with no obvious step-off or deformity appreciated.  Patient able to make  fist, okay sign, thumbs up, resist horizontal adduction of digits, oppose thumb without difficulty.  No sensory deficits along major nerve distributions of lower extremities.  Radial pulses  2+ bilaterally.  Muscular strength 5 out of 5 for upper extremities.  Skin:    General: Skin is warm and dry.     Capillary Refill: Capillary refill takes less than 2 seconds.  Neurological:     Mental Status: She is alert.  Psychiatric:        Mood and Affect: Mood normal.     ED Results / Procedures / Treatments   Labs (all labs ordered are listed, but only abnormal results are displayed) Labs Reviewed - No data to display  EKG None  Radiology DG Shoulder Left  Result Date: 08/02/2022 CLINICAL DATA:  Acute left shoulder pain. EXAM: LEFT SHOULDER - 2+ VIEW COMPARISON:  None Available. FINDINGS: There is no evidence of fracture or dislocation. There is no evidence of arthropathy. Mild acromial spurring is noted. Soft tissues are unremarkable. IMPRESSION: Mild acromial spurring.  No acute abnormality seen. Electronically Signed   By: Marijo Conception M.D.   On: 08/02/2022 16:18    Procedures Procedures    Medications Ordered in ED Medications  ketorolac (TORADOL) 30 MG/ML injection 30 mg (30 mg Intramuscular Given 08/02/22 1641)  HYDROcodone-acetaminophen (NORCO/VICODIN) 5-325 MG per tablet 1 tablet (1 tablet Oral Given 08/02/22 1641)    ED Course/ Medical Decision Making/ A&P                             Medical Decision Making Amount and/or Complexity of Data Reviewed Radiology: ordered.  Risk Prescription drug management.   This patient presents to the ED for concern of shoulder pain, this involves an extensive number of treatment options, and is a complaint that carries with it a high risk of complications and morbidity.  The differential diagnosis includes osteoarthritis, rheumatoid arthritis fracture, strain/sprain, dislocation, neurovascular compromise, compartment syndrome, septic  arthritis, gout  Co morbidities that complicate the patient evaluation  See HPI   Additional history obtained:  Additional history obtained from EMR External records from outside source obtained and reviewed including hospital records   Lab Tests:  N/a   Imaging Studies ordered:  I ordered imaging studies including left shoulder x-ray I independently visualized and interpreted imaging which showed mild acromial spurring.  No acute abnormality. I agree with the radiologist interpretation  Cardiac Monitoring: / EKG:  The patient was maintained on a cardiac monitor.  I personally viewed and interpreted the cardiac monitored which showed an underlying rhythm of: Sinus rhythm   Consultations Obtained:  N/a   Problem List / ED Course / Critical interventions / Medication management  Left shoulder pain I ordered medication including Toradol, Norco   Reevaluation of the patient after these medicines showed that the patient improved I have reviewed the patients home medicines and have made adjustments as needed   Social Determinants of Health:  Denies tobacco, illicit drug use.   Test / Admission - Considered:  Left shoulder pain Vitals signs significant for hypertension with blood pressure 149/103.  Recommend follow-up with primary care regarding elevation blood pressure.. Otherwise within normal range and stable throughout visit. Imaging studies significant for: See above Patient with evidence of left shoulder pain without acute fracture/dislocation.  Patient with diffuse left-sided muscular tenderness over left trapezius muscle as well as left deltoid.  Difficult to ascertain whether or not underlying rotator cuff pathology given limited range of motion secondary to pain.  No obvious infectious process present.  Patient neurovascularly intact distally.  Discussion was had about symptomatic therapy with  left shoulder sling and patient expressed desire to use temporarily.   Patient again educated regarding harms of immobilizing shoulder and potential precipitation of adhesive capsulitis without proper range of motion exercises the patient still elected for left-sided shoulder sling.  Patient on numerous medications gabapentin, aspirin, Flexeril, Voltaren, prednisone, ibuprofen.  Recommend medical therapy outpatient with continued use of ibuprofen and other medicine as well increase Flexeril from 5 mg to 10 mg.  Recommend follow-up with orthopedics for reassessment of symptoms.  Treatment plan discussed at length with patient and she acknowledged understanding was agreeable to said plan. Worrisome signs and symptoms were discussed with the patient, and the patient acknowledged understanding to return to the ED if noticed. Patient was stable upon discharge.          Final Clinical Impression(s) / ED Diagnoses Final diagnoses:  Acute pain of left shoulder    Rx / DC Orders ED Discharge Orders     None         Wilnette Kales, [place] 08/02/22 1644    Hayden Rasmussen, MD 08/03/22 [phone]

## 2022-08-02 NOTE — ED Triage Notes (Signed)
Patient here POV from Home.  Endorses Left Shoulder Pain. Believes pain is from grabbing one of her Grandchildren. RICE therapy has not been very effective and has not been able to sleep on it.   NAD Noted during triage. A&Ox4. GCS 15. Ambulatory.

## 2022-08-02 NOTE — ED Notes (Signed)
Patient verbalizes understanding of discharge instructions. Opportunity for questioning and answers were provided. Patient discharged from ED.  °

## 2022-08-02 NOTE — Discharge Instructions (Addendum)
Workup today overall reassuring.  X-ray was without bony abnormality.  Difficult to tell whether or not you have an underlying rotator cuff injury or if your pain is mainly muscular in nature given the diffuse area of tenderness on exam today.  Will treat with NSAIDs in the form of ibuprofen as well as increase cyclobenzaprine use to 10 mg as needed for muscular spasm.  Continue to rest, ice, affected extremity.  Use of shoulder sling as needed for discomfort make sure not to keep shoulder in sling at all times because it can cause frozen shoulder as we discussed.  Recommend follow-up with orthopedics outpatient for reassessment of your symptoms.  Please do not hesitate to return to emergency department if the worrisome signs and symptoms we discussed become apparent.

## 2022-08-03 ENCOUNTER — Encounter: Payer: Self-pay | Admitting: Physician Assistant

## 2022-08-03 ENCOUNTER — Ambulatory Visit: Payer: BC Managed Care – PPO | Attending: Rheumatology | Admitting: Physician Assistant

## 2022-08-03 ENCOUNTER — Other Ambulatory Visit: Payer: Self-pay | Admitting: *Deleted

## 2022-08-03 VITALS — BP 128/87 | HR 83 | Resp 15 | Ht 62.0 in | Wt 274.0 lb

## 2022-08-03 DIAGNOSIS — G4709 Other insomnia: Secondary | ICD-10-CM

## 2022-08-03 DIAGNOSIS — Z8639 Personal history of other endocrine, nutritional and metabolic disease: Secondary | ICD-10-CM

## 2022-08-03 DIAGNOSIS — M0579 Rheumatoid arthritis with rheumatoid factor of multiple sites without organ or systems involvement: Secondary | ICD-10-CM

## 2022-08-03 DIAGNOSIS — M25512 Pain in left shoulder: Secondary | ICD-10-CM

## 2022-08-03 DIAGNOSIS — M170 Bilateral primary osteoarthritis of knee: Secondary | ICD-10-CM | POA: Diagnosis not present

## 2022-08-03 DIAGNOSIS — E785 Hyperlipidemia, unspecified: Secondary | ICD-10-CM

## 2022-08-03 DIAGNOSIS — M797 Fibromyalgia: Secondary | ICD-10-CM

## 2022-08-03 DIAGNOSIS — Z8659 Personal history of other mental and behavioral disorders: Secondary | ICD-10-CM

## 2022-08-03 DIAGNOSIS — Z8679 Personal history of other diseases of the circulatory system: Secondary | ICD-10-CM

## 2022-08-03 DIAGNOSIS — Z111 Encounter for screening for respiratory tuberculosis: Secondary | ICD-10-CM

## 2022-08-03 DIAGNOSIS — M7061 Trochanteric bursitis, right hip: Secondary | ICD-10-CM | POA: Diagnosis not present

## 2022-08-03 DIAGNOSIS — Z79899 Other long term (current) drug therapy: Secondary | ICD-10-CM | POA: Diagnosis not present

## 2022-08-03 DIAGNOSIS — Z9884 Bariatric surgery status: Secondary | ICD-10-CM

## 2022-08-03 DIAGNOSIS — R911 Solitary pulmonary nodule: Secondary | ICD-10-CM

## 2022-08-03 DIAGNOSIS — M7062 Trochanteric bursitis, left hip: Secondary | ICD-10-CM

## 2022-08-03 DIAGNOSIS — R5383 Other fatigue: Secondary | ICD-10-CM

## 2022-08-03 MED ORDER — FABB 2.2-25-1 MG PO TABS: 1.0000 | ORAL_TABLET | Freq: Every day | ORAL | 2 refills | Status: DC

## 2022-08-03 MED ORDER — DICLOFENAC SODIUM 1 % EX GEL: CUTANEOUS | 2 refills | Status: DC

## 2022-08-03 MED ORDER — GABAPENTIN 300 MG PO CAPS: ORAL_CAPSULE | ORAL | 2 refills | Status: DC

## 2022-08-03 MED ORDER — RASUVO 25 MG/0.5ML ~~LOC~~ SOAJ: SUBCUTANEOUS | 0 refills | Status: DC

## 2022-08-03 NOTE — Progress Notes (Signed)
Office Visit Note  Patient: [name] [name]             Date of Birth: 1966-05-12           MRN: [id number]             PCP: Jonathon Resides, MD (Inactive) Referring: Jonathon Resides, MD Visit Date: 08/03/2022 Occupation: '@GUAROCC'$ @  Subjective:  Pain in multiple joints   History of Present Illness: LEILYN JURKOWSKI is a 57 y.o. female with history of seropositive rheumatoid arthritis and osteoarthritis.  Patient reports that she was diagnosed with COVID-19 on 07/07/2022.  She states she was treated with Paxlovid.  She states that she then developed bronchitis and was treated with a course of doxycycline followed by Augmentin.  She states that she had to postpone her Orencia infusion in February and has not yet rescheduled.  She is hoping to reschedule her Orencia infusion next week.  She has also been holding Rasuvo while taking antibiotics.   Patient is currently having a fibromyalgia flare.  She has generalized myalgias and muscle tenderness due to fibromyalgia.  She is also been experiencing increased muscle cramping at night.  She has been taking magnesium over-the-counter.  Patient reports yesterday she was evaluated in the emergency department for left shoulder pain.  She states that she has an upcoming appointment scheduled tomorrow with orthopedics for further evaluation. Patient reports that she needs her disability paperwork filled out for work and would also like a handicap placard renewed.   Activities of Daily Living:  Patient reports morning stiffness for 2 hours.   Patient Reports nocturnal pain.  Difficulty dressing/grooming: Reports Difficulty climbing stairs: Reports Difficulty getting out of chair: Reports Difficulty using hands for taps, buttons, cutlery, and/or writing: Reports  Review of Systems  Constitutional:  Positive for fatigue.  HENT:  Positive for mouth sores and mouth dryness.   Eyes:  Positive for dryness.  Respiratory:  Positive for shortness  of breath.   Cardiovascular:  Positive for chest pain. Negative for palpitations.  Gastrointestinal:  Positive for constipation. Negative for blood in stool and diarrhea.  Endocrine: Negative for increased urination.  Genitourinary:  Negative for involuntary urination.  Musculoskeletal:  Positive for joint pain, gait problem, joint pain, joint swelling, myalgias, muscle weakness, morning stiffness, muscle tenderness and myalgias.  Skin:  Positive for color change, hair loss and sensitivity to sunlight. Negative for rash.  Allergic/Immunologic: Positive for susceptible to infections.  Neurological:  Positive for dizziness and headaches.  Hematological:  Positive for swollen glands.  Psychiatric/Behavioral:  Positive for depressed mood and sleep disturbance. The patient is nervous/anxious.     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   Dyslipidemia 01/30/2015   MDD (major depressive disorder), recurrent severe, without psychosis ([place]) 12/22/2014   Essential hypertension 08/25/2014   Morbid obesity ([place]) 08/25/2014   CAD (coronary artery disease), native coronary artery 08/24/2014   Numbness and tingling in left  arm    Arm numbness left 08/22/2014   Chest pain 08/22/2014   Gastroesophageal reflux disease without esophagitis 07/23/2014   Coronary artery calcification seen on CAT scan 02/19/2014   Hip pain 10/23/2013   Solitary  pulmonary nodule 08/28/2013   Chest pain, atypical 07/18/2013   Heart palpitations 06/27/2013   Cough XX123456   Diastolic dysfunction 123456   SOB (shortness of breath) 05/09/2013   Other malaise and fatigue 01/21/2013   Stress and adjustment reaction 12/09/2012   Right sided abdominal pain 11/29/2012   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/21/2012   Morbid obesity with BMI of 50.0-59.9, adult ([place]) 11/21/2012   Arthritis 07/13/2012   Depression 07/13/2012   Routine health maintenance 05/07/2012   Migraine 06/07/1998    Past Medical History:  Diagnosis Date   Anxiety    Bursitis    CAD (coronary artery disease), native coronary artery    Coronary CTA showed minimal calcified plaque in the proximal and mid LAD less than 25%, mild left atrial enlargement and a coronary calcium score to 38 which is 99th percentile for age and sex matched controls by coronary CTA 10-2021   Depression    Diastolic dysfunction    Dyslipidemia 01/30/2015   Esophageal ring    Fibromyalgia    Hiatal hernia    HSV-1 infection    Morbid obesity ([place])    Osteoarthritis    Rheumatoid arthritis(714.0)    M05.79   Rheumatoid arthritis, seropositive, 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[place])    Treated with Orencia, TB neg 09/12/2015   Spondylolysis     Family History  Problem Relation Age of Onset   Hyperlipidemia Mother    Depression Mother    Sarcoidosis Father    Lung disease Father        Pleural Mesothelioma   Cancer Father    Heart attack Paternal Grandmother    Hypertension Paternal Grandmother    Hypertension Maternal Grandmother    Diabetes Maternal Grandmother    Stroke Neg Hx    Colon cancer Neg Hx    Esophageal cancer Neg Hx    Rectal cancer Neg Hx    Stomach cancer Neg Hx    Past Surgical History:  Procedure Laterality Date   CARDIAC CATHETERIZATION N/A 06/11/2016   Procedure: Left Heart Cath and Coronary Angiography;  Surgeon: Jettie Booze, MD;  Location: [facility];   Service: Cardiovascular;  Laterality: N/A;   CESAREAN SECTION     COLONOSCOPY  07/27/2017   ESOPHAGEAL MANOMETRY N/A 10/28/2014   Procedure: ESOPHAGEAL MANOMETRY (EM);  Surgeon: Irene Shipper, MD;  Location: [facility];  Service: Endoscopy;  Laterality: N/A;   HERNIA REPAIR     reports surgery on 3 hernias, with 2 more present   LAPAROSCOPIC GASTRIC SLEEVE RESECTION  11/21/12   [facility]   LEFT HEART CATHETERIZATION WITH CORONARY ANGIOGRAM N/A 08/26/2014   Procedure: LEFT HEART CATHETERIZATION WITH CORONARY ANGIOGRAM;  Surgeon: Lorretta Harp, MD;  Location: [facility];  Service: Cardiovascular;  Laterality: N/A;   TUBAL LIGATION     Social History   Social History Narrative   Marital Status: Married Aeronautical engineer)    Children:  Son Marcello Moores) Daughter Margreta Journey)    Pets: None    Living Situation: Lives with husband and children    Occupation: Radiation protection practitioner Scientist, clinical (histocompatibility and immunogenetics))     Education: Dietitian (Psychology)     Tobacco Use/Exposure:  None    Alcohol Use:  Occasional   Drug Use:  None   Diet:  Regular   Exercise: Walking or Treadmill (2 x week)   Hobbies: Crafting, Christmas Decorations.               Immunization History  Administered Date(s) Administered   PFIZER(Purple Top)SARS-COV-2 Vaccination 08/30/2019, 09/20/2019   Tdap 09/21/2016     Objective: Vital Signs: BP 128/87 (BP Location: Right Arm, Patient Position: Sitting, Cuff Size: Normal)   Pulse 83   Resp 15   Ht '5\' 2"'$  (1.575 m)   Wt 274 lb (124.3 kg)   BMI 50.12 kg/m    Physical Exam Vitals and nursing note reviewed.  Constitutional:      Appearance: She is well-developed.  HENT:     Head: Normocephalic and atraumatic.  Eyes:     Conjunctiva/sclera: Conjunctivae normal.  Cardiovascular:     Rate and Rhythm: Normal rate and regular rhythm.     Heart sounds: Normal heart sounds.  Pulmonary:     Effort: Pulmonary effort is normal.     Breath sounds: Normal breath sounds.   Abdominal:     General: Bowel sounds are normal.     Palpations: Abdomen is soft.  Musculoskeletal:     Cervical back: Normal range of motion.  Lymphadenopathy:     Cervical: No cervical adenopathy.  Skin:    General: Skin is warm and dry.     Capillary Refill: Capillary refill takes less than 2 seconds.  Neurological:     Mental Status: She is alert and oriented to person, place, and time.  Psychiatric:        Behavior: Behavior normal.      Musculoskeletal Exam: Generalized hyperalgesia and positive tender points on exam.  Patient main seated during examination today.  C-spine has limited range of motion without rotation.  Trapezius muscle tension tenderness bilaterally.  Right shoulder has good range of motion with some discomfort.  Left shoulder has limited range of motion with severe pain.  Elbow joints, wrist joints, MCPs, PIPs, DIPs have good range of motion with no synovitis.  Complete fist formation bilaterally.  Knee joints have good range of motion with no warmth or effusion.  Ankle joints have good range of motion with some tenderness but no synovitis.  CDAI Exam: CDAI Score: -- Patient Global: 6 mm; Provider Global: 3 mm Swollen: --; Tender: -- Joint Exam 08/03/2022   No joint exam has been documented for this visit   There is currently no information documented on the homunculus. Go to the Rheumatology activity and complete the homunculus joint exam.  Investigation: No additional findings.  Imaging: DG Shoulder Left  Result Date: 08/02/2022 CLINICAL DATA:  Acute left shoulder pain. EXAM: LEFT SHOULDER - 2+ VIEW COMPARISON:  None Available. FINDINGS: There is no evidence of fracture or dislocation. There is no evidence of arthropathy. Mild acromial spurring is noted. Soft tissues are unremarkable. IMPRESSION: Mild acromial spurring.  No acute abnormality seen. Electronically Signed   By: Marijo Conception M.D.   On: 08/02/2022 16:18   CT Angio Chest Aorta W and/or Wo  Contrast  Result Date: 07/26/2022 CLINICAL DATA:  Sudden onset dizziness, shortness of breath, and radiating chest pain. EXAM: CT ANGIOGRAPHY CHEST WITH CONTRAST TECHNIQUE: Multidetector CT imaging of the chest was performed using the standard protocol during bolus administration of intravenous contrast. Multiplanar CT image reconstructions and MIPs were obtained to evaluate the vascular anatomy. RADIATION DOSE REDUCTION: This exam was performed according to the departmental dose-optimization program which includes automated exposure  control, adjustment of the mA and/or kV according to patient size and/or use of iterative reconstruction technique. CONTRAST:  164m OMNIPAQUE IOHEXOL 350 MG/ML SOLN COMPARISON:  PA Lat chest today, PA Lat 07/07/2022, coronary CTA 11/03/2021, chest CT no contrast 01/23/2015. FINDINGS: Cardiovascular: There is moderate panchamber cardiomegaly with a left chamber predominance. The central pulmonary veins are borderline prominent. There are mild calcifications in the proximal LAD and proximal circumflex coronary arteries. There is no pericardial effusion. The pulmonary arteries are normal in caliber without visible thromboembolic disease. The thoracic aorta and great vessels are unremarkable. There are no visible plaques in the thoracic aorta and great vessels. Mediastinum/Nodes: No enlarged mediastinal, hilar, or axillary lymph nodes. Thyroid gland, trachea, and esophagus demonstrate no significant findings. Moderate-sized hiatal hernia is again noted with surgical changes along the hiatus. Lungs/Pleura: Eventration and focal elevation noted in the anterior right hemidiaphragm. There is no pleural effusion, thickening or pneumothorax. There are paraspinal scarring changes again in the right lower lobe. There is scattered linear atelectasis both bases. There is a chronic 4 mm ground-glass nodule in the right middle lobe perihilar area on 7:59, stable since 2016 and presumed benign. The  lungs are clear of infiltrates and further nodules. Upper Abdomen: There is a mildly prominent liver with mild steatosis. No acute abnormality. Respiratory motion limits fine detail. Musculoskeletal: No chest wall abnormality. No acute or significant osseous findings. Review of the MIP images confirms the above findings. IMPRESSION: 1. No evidence of arterial dilatation or embolus. 2. Cardiomegaly with borderline prominent central pulmonary veins. No pulmonary edema. 3. Coronary artery atherosclerosis. 4. Hiatal hernia with surgical changes along the hiatus. 5. Mildly prominent liver with mild steatosis. 6. Chronic 4 mm ground-glass nodule in the right middle lobe, stable since 2016 and presumed benign. Electronically Signed   By: KTelford NabM.D.   On: 07/26/2022 21:16   DG Chest 2 View  Result Date: 07/26/2022 CLINICAL DATA:  Dizziness, shortness of breath and chest pain. EXAM: CHEST - 2 VIEW COMPARISON:  07/07/2022 FINDINGS: The heart size and mediastinal contours are within normal limits. There is no evidence of pulmonary edema, consolidation, pneumothorax or pleural fluid. The visualized skeletal structures are unremarkable. IMPRESSION: No active cardiopulmonary disease. Electronically Signed   By: GAletta EdouardM.D.   On: 07/26/2022 16:18   DG Chest 2 View  Result Date: 07/07/2022 CLINICAL DATA:  Cough, shortness of breath, COVID positive EXAM: CHEST - 2 VIEW COMPARISON:  07/02/2022 FINDINGS: The heart size and mediastinal contours are within normal limits. Both lungs are clear. The visualized skeletal structures are unremarkable. IMPRESSION: No active cardiopulmonary disease. Electronically Signed   By: KRolm BaptiseM.D.   On: 07/07/2022 12:05    Recent Labs: Lab Results  Component Value Date   WBC 8.9 07/26/2022   HGB 13.3 07/26/2022   PLT 248 07/26/2022   NA 143 07/26/2022   K 3.8 07/26/2022   CL 108 07/26/2022   CO2 27 07/26/2022   GLUCOSE 93 07/26/2022   BUN 19 07/26/2022    CREATININE 1.09 (H) 07/26/2022   BILITOT 0.6 05/20/2022   ALKPHOS 68 05/20/2022   AST 25 05/20/2022   ALT 17 05/20/2022   PROT 6.8 05/20/2022   ALBUMIN 3.3 (L) 05/20/2022   CALCIUM 8.4 (L) 07/26/2022   GFRAA >60 12/26/2019   QFTBGOLD Negative 09/01/2016   QFTBGOLDPLUS Negative 08/12/2021    Speciality Comments: ORENCIA 1000 mg x 4 weeks  Procedures:  No procedures performed Allergies: Influenza vaccines, Influenza virus  vaccine, Amitriptyline, and Isosorbide      Assessment / Plan:     Visit Diagnoses: Rheumatoid arthritis involving multiple sites with positive rheumatoid factor ([place]): She has no synovitis on examination today.  She has been experiencing increased arthralgias and joint stiffness which she attributes to having to postpone her IV Orencia infusion as well as her doses of Rasuvo due to recent infections in February 2024.  She was diagnosed with COVID-19 on 07/07/2022 and was prescribed Paxlovid.  She was then diagnosed with bronchitis and was treated with a course of doxycycline followed by a course of Augmentin.  She has completed all antibiotics and her symptoms have resolved.  She plans on calling to schedule her next IV orencia infusion.  She plans on resuming rasuvo this week.  She was advised to notify us if she develops any new or worsening symptoms.  She will continue to follow-up closely every 5 months or sooner if needed.  High risk medication use -  IV Orencia 1,000 mg infusions every 28 days, Rasuvo 25 mg per 0.5 mL subcutaneous weekly, and FABB daily.  She is planning to call to schedule her next Orencia infusion.  A refill of Rasuvo and FABB will be sent to the pharmacy today. CBC and BMP updated on 07/26/22.  She will be having updated lab work with her next infusion. TB gold negative 08/12/21.  Future order for TB Gold placed today.Plan: QuantiFERON-TB Gold Plus Recently diagnosed with COVID-19 on 07/07/2022-treated with Paxlovid.  Was then diagnosed with  bronchitis and was treated with a course of doxycycline followed by a course of Augmentin.  Her symptoms have completely resolved and she has completed the antibiotics.  She has been holding Rasuvo as advised and postpone her Orencia infusion.  She did not have an Orencia infusion in February 2024.  Screening for tuberculosis - Future order for TB gold placed today. Plan: QuantiFERON-TB Gold Plus  Acute pain of left shoulder: Evaluated in the emergency department yesterday.  She started to experience acute left shoulder pain starting on Saturday after reaching for her grandchild who was about to fall off the bed.  In the emergency department x-rays of the left shoulder were obtained which revealed mild AC joint spurring.  She was given a Toradol injection and a prescription for Norco for pain relief.   She continues to experience significant discomfort.  She has very limited range of motion on examination today and was unable to perform range of motion exercises due to the level of pain she was experiencing.  She has upcoming follow-up visit scheduled with orthopedics tomorrow for further evaluation and management.   Primary osteoarthritis of both knees: MRI of the right knee on 05/23/2020 which was negative for meniscal or ligament tear.  She continues to experience intermittent discomfort in both knee joints.  She is Voltaren gel topically as needed for pain relief.  A refill of Voltaren gel was sent to the pharmacy today.  Trochanteric bursitis of both hips: She has ongoing discomfort due to trochanter bursitis of both hips.  She plans on restarting physical therapy now that her infections have cleared.  Fibromyalgia: She has generalized hyperalgesia and positive tender points on examination.  She has been experiencing recurrent and frequent flares of fibromyalgia.  During the month of February she was more sedentary than usual while recovering from COVID-19 followed by bronchitis.  She has been unable  to go to physical therapy while recovering from being sick.  She plans on rescheduling  physical therapy which has been helpful to minimize her hip pain.  Discussed the importance of regular exercise and good sleep hygiene.  She plans on remaining on gabapentin and Flexeril as prescribed.  Refills were sent to the pharmacy today.  Other insomnia: Chronic.  Discussed the importance of good sleep hygiene.  Other fatigue: Chronic and secondary to insomnia.  Discussed the importance of regular exercise and good sleep hygiene.  Other medical conditions are listed as follows:  History of hypertension: Blood pressure was 128/87 today in the office.  Dyslipidemia  History of coronary artery disease  History of diastolic dysfunction  Solitary pulmonary nodule  History of depression  History of vitamin D deficiency  S/P laparoscopic sleeve gastrectomy    Orders: Orders Placed This Encounter  Procedures   QuantiFERON-TB Gold Plus   Meds ordered this encounter  Medications   Folic Acid-Vit Q000111Q 123456 (FABB) 2.2-25-1 MG TABS    Sig: Take 1 tablet by mouth daily.    Dispense:  90 tablet    Refill:  2   gabapentin (NEURONTIN) 300 MG capsule    Sig: TAKE 1 CAPSULE BY MOUTH THREE TIMES A DAY AS NEEDED FOR PAIN    Dispense:  90 capsule    Refill:  2   diclofenac Sodium (VOLTAREN) 1 % GEL    Sig: Apply 2-4 grams to affected joint 4 times daily as needed.    Dispense:  400 g    Refill:  2     Follow-Up Instructions: Return in about 5 months (around 01/01/2023) for Rheumatoid arthritis, Osteoarthritis, Fibromyalgia.   Ofilia Neas, PA-C  Note - This record has been created using Dragon software.  Chart creation errors have been sought, but may not always  have been located. Such creation errors do not reflect on  the standard of medical care.

## 2022-08-04 ENCOUNTER — Ambulatory Visit: Payer: BC Managed Care – PPO | Admitting: Physician Assistant

## 2022-08-05 ENCOUNTER — Ambulatory Visit: Payer: BC Managed Care – PPO | Admitting: Physician Assistant

## 2022-08-05 DIAGNOSIS — R911 Solitary pulmonary nodule: Secondary | ICD-10-CM

## 2022-08-05 DIAGNOSIS — Z8659 Personal history of other mental and behavioral disorders: Secondary | ICD-10-CM

## 2022-08-05 DIAGNOSIS — Z8639 Personal history of other endocrine, nutritional and metabolic disease: Secondary | ICD-10-CM

## 2022-08-05 DIAGNOSIS — M797 Fibromyalgia: Secondary | ICD-10-CM

## 2022-08-05 DIAGNOSIS — M170 Bilateral primary osteoarthritis of knee: Secondary | ICD-10-CM

## 2022-08-05 DIAGNOSIS — E785 Hyperlipidemia, unspecified: Secondary | ICD-10-CM

## 2022-08-05 DIAGNOSIS — M7061 Trochanteric bursitis, right hip: Secondary | ICD-10-CM

## 2022-08-05 DIAGNOSIS — R5383 Other fatigue: Secondary | ICD-10-CM

## 2022-08-05 DIAGNOSIS — Z9884 Bariatric surgery status: Secondary | ICD-10-CM

## 2022-08-05 DIAGNOSIS — M0579 Rheumatoid arthritis with rheumatoid factor of multiple sites without organ or systems involvement: Secondary | ICD-10-CM

## 2022-08-05 DIAGNOSIS — Z8679 Personal history of other diseases of the circulatory system: Secondary | ICD-10-CM

## 2022-08-05 DIAGNOSIS — G4709 Other insomnia: Secondary | ICD-10-CM

## 2022-08-05 DIAGNOSIS — Z79899 Other long term (current) drug therapy: Secondary | ICD-10-CM

## 2022-08-09 ENCOUNTER — Other Ambulatory Visit: Payer: Self-pay | Admitting: Rheumatology

## 2022-08-10 ENCOUNTER — Other Ambulatory Visit: Payer: Self-pay | Admitting: Cardiology

## 2022-08-10 ENCOUNTER — Encounter: Payer: Self-pay | Admitting: *Deleted

## 2022-08-10 NOTE — Telephone Encounter (Signed)
Spoke with patient and she states she needs the consent form to be faxed. Patient states from what she understands they will only be contacting us to ask for her medical records that we will not need to complete the paperwork and she will not need referral for FCE. Consent form faxed.

## 2022-08-12 ENCOUNTER — Encounter: Payer: Self-pay | Admitting: Radiology

## 2022-08-12 ENCOUNTER — Encounter (HOSPITAL_COMMUNITY): Payer: BC Managed Care – PPO

## 2022-08-20 ENCOUNTER — Telehealth: Payer: Self-pay | Admitting: Pharmacist

## 2022-08-20 NOTE — Telephone Encounter (Signed)
Please renew Orencia IV infusions. Auth expired on on 06/08/2022. Will nede to back date to 06/07/2022 if possible  Knox Saliva, PharmD, MPH, BCPS, CPP Clinical Pharmacist (Rheumatology and Pulmonology)

## 2022-08-25 NOTE — Telephone Encounter (Addendum)
Contacted [employer] and spoke with rep regarding authorization for [id number]. Per rep the authorization can only be backdated 30 days from time of request (starting 07/26/22).   Completed clinical questions via telephone. Rep states that she has documented my direct office phone number if additional information is required during review. Expected turnaround time is 5 business days.  Ref# [id number] Phone#: [phone] Fax#: [phone]

## 2022-08-30 NOTE — Telephone Encounter (Signed)
Received approval for pt's Orencia, however the rep that entered the information over the phone documented the dose as "100" rather than "1000".  Reached back out to [facility] and learned that, supposedly, 100 IS 1,000 units and that everything looks fine on their end. Approval letter will be sent to scan center.

## 2022-08-30 NOTE — Telephone Encounter (Signed)
I called patient, patient advised Seth Bake is out of the office and will call her then she is available.

## 2022-08-31 NOTE — Telephone Encounter (Signed)
Spoke with patient. She states [employer] advised her our office refused to send her medical records. Advised patient, I have no knowledge of [place] refusing to send her medical records.Advised patient we do have a medical records department that handles that aspect. Patient advised to contact the medical records department to ask them about it and have them send the records. Patient expressed understanding.

## 2022-09-14 ENCOUNTER — Ambulatory Visit: Payer: BC Managed Care – PPO | Admitting: Podiatry

## 2022-09-20 ENCOUNTER — Telehealth: Payer: Self-pay | Admitting: *Deleted

## 2022-09-20 ENCOUNTER — Ambulatory Visit (HOSPITAL_COMMUNITY)
Admission: RE | Admit: 2022-09-20 | Discharge: 2022-09-20 | Disposition: A | Discharge status: Home / Self Care | Payer: BC Managed Care – PPO | Source: Ambulatory Visit | Attending: Rheumatology | Admitting: Rheumatology

## 2022-09-20 DIAGNOSIS — Z79899 Other long term (current) drug therapy: Secondary | ICD-10-CM | POA: Insufficient documentation

## 2022-09-20 DIAGNOSIS — Z111 Encounter for screening for respiratory tuberculosis: Secondary | ICD-10-CM | POA: Insufficient documentation

## 2022-09-20 DIAGNOSIS — M0579 Rheumatoid arthritis with rheumatoid factor of multiple sites without organ or systems involvement: Secondary | ICD-10-CM | POA: Insufficient documentation

## 2022-09-20 LAB — CBC WITH DIFFERENTIAL/PLATELET
Abs Immature Granulocytes: 0.04 10*3/uL (ref 0.00–0.07)
Basophils Absolute: 0.1 10*3/uL (ref 0.0–0.1)
Basophils Relative: 1 %
Eosinophils Absolute: 0.2 10*3/uL (ref 0.0–0.5)
Eosinophils Relative: 2 %
HCT: 44 % (ref 36.0–46.0)
Hemoglobin: 14 g/dL (ref 12.0–15.0)
Immature Granulocytes: 0 %
Lymphocytes Relative: 43 %
Lymphs Abs: 4.1 10*3/uL — ABNORMAL HIGH (ref 0.7–4.0)
MCH: 27.1 pg (ref 26.0–34.0)
MCHC: 31.8 g/dL (ref 30.0–36.0)
MCV: 85.3 fL (ref 80.0–100.0)
Monocytes Absolute: 0.7 10*3/uL (ref 0.1–1.0)
Monocytes Relative: 7 %
Neutro Abs: 4.4 10*3/uL (ref 1.7–7.7)
Neutrophils Relative %: 47 %
Platelets: 289 10*3/uL (ref 150–400)
RBC: 5.16 MIL/uL — ABNORMAL HIGH (ref 3.87–5.11)
RDW: 12.9 % (ref 11.5–15.5)
WBC: 9.4 10*3/uL (ref 4.0–10.5)
nRBC: 0 % (ref 0.0–0.2)

## 2022-09-20 LAB — COMPREHENSIVE METABOLIC PANEL
ALT: 18 U/L (ref 0–44)
AST: 16 U/L (ref 15–41)
Albumin: 3.2 g/dL — ABNORMAL LOW (ref 3.5–5.0)
Alkaline Phosphatase: 65 U/L (ref 38–126)
Anion gap: 9 (ref 5–15)
BUN: 11 mg/dL (ref 6–20)
CO2: 26 mmol/L (ref 22–32)
Calcium: 8.8 mg/dL — ABNORMAL LOW (ref 8.9–10.3)
Chloride: 104 mmol/L (ref 98–111)
Creatinine, Ser: 0.94 mg/dL (ref 0.44–1.00)
GFR, Estimated: >60 mL/min (ref >60)
Glucose, Bld: 88 mg/dL (ref 70–99)
Potassium: 3.9 mmol/L (ref 3.5–5.1)
Sodium: 139 mmol/L (ref 135–145)
Total Bilirubin: 0.7 mg/dL (ref 0.3–1.2)
Total Protein: 6.6 g/dL (ref 6.5–8.1)

## 2022-09-20 MED ORDER — DIPHENHYDRAMINE HCL 25 MG PO CAPS
ORAL_CAPSULE | ORAL | Status: AC
  Filled 2022-09-20: qty 1

## 2022-09-20 MED ORDER — SODIUM CHLORIDE 0.9 % IV SOLN
1000.0000 mg | INTRAVENOUS | Status: DC
  Administered 2022-09-20: 1000 mg via INTRAVENOUS
  Filled 2022-09-20: qty 40

## 2022-09-20 MED ORDER — ACETAMINOPHEN 325 MG PO TABS
ORAL_TABLET | ORAL | Status: AC
  Filled 2022-09-20: qty 2

## 2022-09-20 MED ORDER — DIPHENHYDRAMINE HCL 25 MG PO CAPS
25.0000 mg | ORAL_CAPSULE | ORAL | Status: DC
  Administered 2022-09-20: 25 mg via ORAL

## 2022-09-20 MED ORDER — ACETAMINOPHEN 325 MG PO TABS
650.0000 mg | ORAL_TABLET | ORAL | Status: DC
  Administered 2022-09-20: 650 mg via ORAL

## 2022-09-20 NOTE — Telephone Encounter (Signed)
Patient inquiring about the [employer] patient assistance for her Orencia infusions. Patient would like a call back.

## 2022-09-20 NOTE — Progress Notes (Signed)
Pt here today for Orencia. She has two torn tendons in her left shoulder and is to be seen this Thursday at a surgeons office.  Okay to proceed today with Orencia per Dr Corliss Skains.  Instructed pt to consult with the surgeon on when she can have her next orencia dose and have the surgeon send a clearance letter to Dr Fatima Sanger office.

## 2022-09-20 NOTE — Progress Notes (Signed)
CBC stable.  Calcium remains borderline low but has improved. Rest of CMP WNL.  We will continue to monitor.

## 2022-09-22 LAB — QUANTIFERON-TB GOLD PLUS (RQFGPL)
QuantiFERON Mitogen Value: >10 IU/mL
QuantiFERON Nil Value: 0.04 IU/mL
QuantiFERON TB1 Ag Value: 0.05 IU/mL
QuantiFERON TB2 Ag Value: 0.03 IU/mL

## 2022-09-22 LAB — QUANTIFERON-TB GOLD PLUS: QuantiFERON-TB Gold Plus: NEGATIVE

## 2022-09-22 NOTE — Progress Notes (Signed)
TB gold negative

## 2022-09-23 ENCOUNTER — Telehealth: Payer: Self-pay | Admitting: Pharmacist

## 2022-09-23 NOTE — Telephone Encounter (Signed)
Spoke with patient regarding Orencia copay assistance. She will stop by clinic to sign her part of application for On Call program enrollment.  Prescriber form placed in Dr. Fatima Sanger folder for signature  Chesley Mires, PharmD, MPH, BCPS, CPP Clinical Pharmacist (Rheumatology and Pulmonology)

## 2022-09-24 ENCOUNTER — Inpatient Hospital Stay (HOSPITAL_COMMUNITY): Admission: RE | Admit: 2022-09-24 | Payer: BC Managed Care – PPO | Source: Ambulatory Visit

## 2022-09-29 NOTE — Telephone Encounter (Signed)
Received signed provider form for Orencia On Call enrollment form. Pending patient form  Chesley Mires, PharmD, MPH, BCPS, CPP Clinical Pharmacist (Rheumatology and Pulmonology)

## 2022-10-01 ENCOUNTER — Ambulatory Visit (INDEPENDENT_AMBULATORY_CARE_PROVIDER_SITE_OTHER): Payer: BC Managed Care – PPO

## 2022-10-01 ENCOUNTER — Ambulatory Visit (INDEPENDENT_AMBULATORY_CARE_PROVIDER_SITE_OTHER): Payer: BC Managed Care – PPO | Admitting: Podiatry

## 2022-10-01 ENCOUNTER — Other Ambulatory Visit: Payer: Self-pay | Admitting: Podiatry

## 2022-10-01 ENCOUNTER — Encounter: Payer: Self-pay | Admitting: Podiatry

## 2022-10-01 DIAGNOSIS — M778 Other enthesopathies, not elsewhere classified: Secondary | ICD-10-CM

## 2022-10-01 DIAGNOSIS — M722 Plantar fascial fibromatosis: Secondary | ICD-10-CM

## 2022-10-01 DIAGNOSIS — M7732 Calcaneal spur, left foot: Secondary | ICD-10-CM | POA: Diagnosis not present

## 2022-10-01 NOTE — Patient Instructions (Signed)

## 2022-10-02 NOTE — Progress Notes (Signed)
Subjective:   Patient ID: [name] [name], female   DOB: 56 y.o.   MRN: [id number]   HPI Chief Complaint  Patient presents with   Foot Pain    Plantar/lateral heel left - aching x 2 weeks, AM pain, went to see Dr. Donnie Coffin, but wanted follow up here, having shoulder surgery Monday-can't be on any NSAIDS currently   Diabetes   New Patient (Initial Visit)    Est pt [id number]/4538   57 year old female presents the office today with above concerns.  She states that "shooting pain to the plantar, plantar lateral aspect heel as she points to.  She previously seen another provider for the injection which was not helpful.  No injuries that she reports.  She has pain in the morning or during the day to be on her feet.  No other treatment.  She is scheduled to have shoulder surgery on Monday   Review of Systems  All other systems reviewed and are negative.  Past Medical History:  Diagnosis Date   Anxiety    Bursitis    CAD (coronary artery disease), native coronary artery    Coronary CTA showed minimal calcified plaque in the proximal and mid LAD less than 25%, mild left atrial enlargement and a coronary calcium score to 73 which is 99th percentile for age and sex matched controls by coronary CTA 10-2021   Depression    Diastolic dysfunction    Dyslipidemia 01/30/2015   Esophageal ring    Fibromyalgia    Hiatal hernia    HSV-1 infection    Morbid obesity (HCC)    Osteoarthritis    Rheumatoid arthritis(714.0)    M05.79   Rheumatoid arthritis, seropositive, 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HCC)    Treated with Orencia, TB neg 09/12/2015   Spondylolysis     Past Surgical History:  Procedure Laterality Date   CARDIAC CATHETERIZATION N/A 06/11/2016   Procedure: Left Heart Cath and Coronary Angiography;  Surgeon: Corky Crafts, MD;  Location: [facility] INVASIVE CV LAB;  Service: Cardiovascular;  Laterality: N/A;   CESAREAN SECTION     COLONOSCOPY  07/27/2017   ESOPHAGEAL MANOMETRY N/A 10/28/2014    Procedure: ESOPHAGEAL MANOMETRY (EM);  Surgeon: Hilarie Fredrickson, MD;  Location: [facility] ENDOSCOPY;  Service: Endoscopy;  Laterality: N/A;   HERNIA REPAIR     reports surgery on 3 hernias, with 2 more present   LAPAROSCOPIC GASTRIC SLEEVE RESECTION  11/21/12   [facility]   LEFT HEART CATHETERIZATION WITH CORONARY ANGIOGRAM N/A 08/26/2014   Procedure: LEFT HEART CATHETERIZATION WITH CORONARY ANGIOGRAM;  Surgeon: Runell Gess, MD;  Location: [facility] CATH LAB;  Service: Cardiovascular;  Laterality: N/A;   TUBAL LIGATION       Current Outpatient Medications:    methocarbamol (ROBAXIN) 500 MG tablet, Take 1 tablet 3 times a day by oral route., Disp: , Rfl:    Abatacept (ORENCIA IV), Inject 1,000 mg into the vein every 28 (twenty-eight) days., Disp: , Rfl:    acetaminophen-codeine (TYLENOL #3) 300-30 MG tablet, Take 1 tablet by mouth every 6 (six) hours as needed for moderate pain. (Patient not taking: Reported on 08/03/2022), Disp: 10 tablet, Rfl: 0   acyclovir ointment (ZOVIRAX) 5 %, as needed. (Patient not taking: Reported on 08/03/2022), Disp: , Rfl:    Acyclovir-Hydrocortisone (XERESE) 5-1 % CREA, as needed., Disp: , Rfl:    Adapalene 0.3 % gel, Apply 1 application topically daily as needed (acne). , Disp: , Rfl:    albuterol (VENTOLIN HFA) 108 (90 Base)  MCG/ACT inhaler, Inhale into the lungs., Disp: , Rfl:    Alcohol Swabs (ALCOHOL WIPES) 70 % PADS, Alcohol Prep Pads, Disp: , Rfl:    ALPRAZolam (XANAX) 0.5 MG tablet, TAKE 1 TABLET BY MOUTH NIGHTLY AS NEEDED FOR ANXIETY/SLEEP INDICATIONS ANXIOUS, INSOMNIA GRIEVING., Disp: , Rfl:    ARIPiprazole (ABILIFY) 2 MG tablet, Take 2 mg by mouth daily., Disp: , Rfl:    aspirin EC 81 MG tablet, Take 1 tablet (81 mg total) by mouth daily. Swallow whole., Disp: 90 tablet, Rfl: 3   atorvastatin (LIPITOR) 80 MG tablet, TAKE 1 TABLET BY MOUTH EVERY DAY AT 6PM, Disp: , Rfl:    Azelastine HCl 137 MCG/SPRAY SOLN, , Disp: , Rfl:    benzonatate (TESSALON) 100 MG capsule, Take  1 capsule (100 mg total) by mouth 2 (two) times daily as needed for cough. (Patient not taking: Reported on 08/03/2022), Disp: 20 capsule, Rfl: 0   benzonatate (TESSALON) 200 MG capsule, Take by mouth., Disp: , Rfl:    betamethasone, augmented, (DIPROLENE) 0.05 % lotion, Apply topically as needed., Disp: , Rfl:    buPROPion (WELLBUTRIN XL) 150 MG 24 hr tablet, Take 450 mg by mouth every morning. (Patient not taking: Reported on 08/03/2022), Disp: , Rfl:    buPROPion (WELLBUTRIN XL) 150 MG 24 hr tablet, Take 3 tablets by mouth every morning., Disp: , Rfl:    cetirizine (ZYRTEC ALLERGY) 10 MG tablet, Take 1 tablet (10 mg total) by mouth daily., Disp: 30 tablet, Rfl: 0   Cetirizine HCl (ZYRTEC ALLERGY PO), 10 mg as needed., Disp: , Rfl:    cetirizine-pseudoephedrine (ZYRTEC-D) 5-120 MG tablet, Take 1 tablet by mouth 2 (two) times daily. (Patient not taking: Reported on 01/27/2022), Disp: 30 tablet, Rfl: 0   ciclopirox (LOPROX) 0.77 % cream, as needed., Disp: , Rfl:    Ciclopirox 1 % shampoo, Apply topically as needed., Disp: , Rfl:    diazepam (VALIUM) 5 MG tablet, Take 1 tablet every day by oral route., Disp: , Rfl:    diclofenac Sodium (VOLTAREN) 1 % GEL, Apply 2-4 grams to affected joint 4 times daily as needed., Disp: 400 g, Rfl: 2   diltiazem (CARDIZEM CD) 180 MG 24 hr capsule, Take 1 capsule (180 mg total) by mouth daily., Disp: 90 capsule, Rfl: 3   doxepin (SINEQUAN) 25 MG capsule, Take 25 mg by mouth at bedtime as needed., Disp: , Rfl:    EPINEPHrine 0.3 mg/0.3 mL IJ SOAJ injection, , Disp: , Rfl:    estradiol (ESTRACE) 0.5 MG tablet, Take 0.5 mg by mouth daily. (Patient not taking: Reported on 08/03/2022), Disp: , Rfl:    estradiol (ESTRACE) 1 MG tablet, Take by mouth., Disp: , Rfl:    ezetimibe (ZETIA) 10 MG tablet, Take 1 tablet (10 mg total) by mouth daily. (Patient not taking: Reported on 08/03/2022), Disp: 90 tablet, Rfl: 3   famciclovir (FAMVIR) 500 MG tablet, Take 15,000 mg by mouth daily  as needed (fever blister). , Disp: , Rfl: 11   famotidine (PEPCID) 20 MG tablet, Take 20 mg by mouth as needed., Disp: , Rfl:    famotidine (PEPCID) 20 MG tablet, daily. (Patient not taking: Reported on 08/03/2022), Disp: , Rfl:    fluticasone (FLONASE) 50 MCG/ACT nasal spray, Place 1 spray into both nostrils daily for 3 days., Disp: 16 g, Rfl: 0   fluticasone (FLONASE) 50 MCG/ACT nasal spray, as needed., Disp: , Rfl:    Folic Acid-Vit B6-Vit B12 (FABB) 2.2-25-1 MG TABS, Take 1  tablet by mouth daily., Disp: 90 tablet, Rfl: 2   gabapentin (NEURONTIN) 300 MG capsule, TAKE 1 CAPSULE BY MOUTH THREE TIMES A DAY AS NEEDED FOR PAIN, Disp: 90 capsule, Rfl: 2   hydrocortisone 2.5 % cream, SMARTSIG:1 Topical Every Night, Disp: , Rfl:    hydrOXYzine (VISTARIL) 25 MG capsule, Take 25 mg by mouth as needed., Disp: , Rfl:    ketoconazole (NIZORAL) 2 % cream, , Disp: , Rfl:    ketoconazole (NIZORAL) 2 % shampoo, , Disp: , Rfl:    levothyroxine (SYNTHROID) 25 MCG tablet, as needed. (Patient not taking: Reported on 08/03/2022), Disp: , Rfl:    lidocaine (LIDODERM) 5 %, Apply 1 patch topically as needed., Disp: , Rfl:    lidocaine (LIDODERM) 5 %, APPLY 1 PATCH TO AFFECTED AREA FOR 12 HOURS IN A 24 HOUR PERIOD (Patient not taking: Reported on 08/03/2022), Disp: 30 patch, Rfl: 2   LINZESS 145 MCG CAPS capsule, Take 145 mcg by mouth daily., Disp: , Rfl:    Methotrexate, PF, (RASUVO) 25 MG/0.5ML SOAJ, INJECT ONE PEN SUBCUTANEOUSLY ONCE EVERY WEEK. STORE AT ROOM TEMPERATURE BETWEEN 68 - 77 DEGREES F., Disp: 6 mL, Rfl: 0   metoprolol tartrate (LOPRESSOR) 100 MG tablet, Take 1 tablet (100 mg total) by mouth once for 1 dose. Take 1 tablet (100 mg total) two hours prior to CT scan., Disp: 1 tablet, Rfl: 0   neomycin-polymyxin-hydrocortisone (CORTISPORIN) 3.5-10000-1 OTIC suspension, as needed., Disp: , Rfl:    nitroGLYCERIN (NITROSTAT) 0.4 MG SL tablet, PLACE 1 TABLET UNDER THE TONGUE EVERY 5 MINUTES AS NEEDED FOR CHEST PAIN.,  Disp: 25 tablet, Rfl: 2   NYAMYC powder, Apply topically., Disp: , Rfl:    nystatin cream (MYCOSTATIN), Apply topically., Disp: , Rfl:    Oxymetazoline HCl (NASAL SPRAY) 0.05 % SOLN, Nasal Decongestant (oxymetazoline) 0.05 % spray, Disp: , Rfl:    pantoprazole (PROTONIX) 40 MG tablet, Take by mouth., Disp: , Rfl:    predniSONE (DELTASONE) 20 MG tablet, Take 2 tablets daily with breakfast. (Patient not taking: Reported on 01/27/2022), Disp: 10 tablet, Rfl: 0   progesterone (PROMETRIUM) 100 MG capsule, Take 100 mg by mouth daily., Disp: , Rfl:    PROGESTERONE PO, 100 mg daily. (Patient not taking: Reported on 08/03/2022), Disp: , Rfl:    promethazine-dextromethorphan (PROMETHAZINE-DM) 6.25-15 MG/5ML syrup, Take 5 mLs by mouth 4 (four) times daily as needed for cough. (Patient not taking: Reported on 08/03/2022), Disp: 118 mL, Rfl: 0   propranolol (INDERAL) 10 MG tablet, Take 10 mg by mouth daily. (Patient not taking: Reported on 08/03/2022), Disp: , Rfl:    propranolol (INDERAL) 20 MG tablet, Take by mouth., Disp: , Rfl:    pseudoephedrine (SUDAFED) 60 MG tablet, Take 1 tablet (60 mg total) by mouth every 8 (eight) hours as needed for congestion. (Patient not taking: Reported on 08/03/2022), Disp: 30 tablet, Rfl: 0   PULMICORT FLEXHALER 180 MCG/ACT inhaler, Inhale into the lungs., Disp: , Rfl:    rizatriptan (MAXALT) 10 MG tablet, Take 1 tablet (10 mg total) by mouth as needed for migraine. May repeat in 2 hours if needed (Patient not taking: Reported on 08/03/2022), Disp: 10 tablet, Rfl: 0   triamcinolone (KENALOG) 0.1 % paste, Use as directed 1 application. in the mouth or throat 2 (two) times daily. Apply to mouth sores (Patient not taking: Reported on 08/03/2022), Disp: 5 g, Rfl: 12   Triamcinolone Acetonide 0.025 % LOTN, as needed., Disp: , Rfl:    valACYclovir (VALTREX)  500 MG tablet, Take 500 mg by mouth as needed., Disp: , Rfl:    Vitamin D, Ergocalciferol, (DRISDOL) 1.25 MG (50000 UT) CAPS capsule,  Take 50,000 Units by mouth See admin instructions. Take 1 tablet (50000 units) by mouth every Monday, Wednesday, Saturday, Disp: , Rfl:   Current Facility-Administered Medications:    ipratropium (ATROVENT) 0.03 % nasal spray 2 spray, 2 spray, Each Nare, TID, Ward, Tylene Fantasia, PA-C  Allergies  Allergen Reactions   Influenza Vaccines Anaphylaxis   Influenza Virus Vaccine Anaphylaxis    Other reaction(s): Unknown   Amitriptyline    Isosorbide     Severe headaches           Objective:  Physical Exam  General: AAO x3, NAD  Dermatological: Skin is warm, dry and supple bilateral.  There are no open sores, no preulcerative lesions, no rash or signs of infection present.  Vascular: Dorsalis Pedis artery and Posterior Tibial artery pedal pulses are 2/4 bilateral with immedate capillary fill time.  There is no pain with calf compression, swelling, warmth, erythema.   Neruologic: Grossly intact via light touch bilateral.  Negative Tinel sign.  Musculoskeletal: There is tenderness palpation on the plantar central, plantar lateral aspect of the calcaneus on insertion of the plantar fascia and mildly to the plantar medial aspect.  There is no pain with lateral compression of calcaneus.  There is no area pinpoint tenderness.  Flexor, extensor tendons appear to be intact.  MMT 5/5.  Gait: Unassisted, Nonantalgic.       Assessment:   57 year old female with heel pain, likely plantar fasciitis     Plan:  -Treatment options discussed including all alternatives, risks, and complications -Etiology of symptoms were discussed -X-rays were obtained and reviewed with the patient.  3 views of the left foot were obtained.  There is no evidence of acute fracture.  Calcaneal spurring is present both posteriorly and inferiorly.  Mild spurring present at the midfoot. -Given her upcoming surgery on Monday Monday to hold off on steroids or anti-inflammatories.  Discussed icing daily.  Discussed topical  medication such as Biofreeze or other anti-inflammatory topical creams that she can use. -We discussed stretching exercises daily.  I dispensed a plantar fascia brace as well as To help support and stretch the plantar fascia episode of soft tissue healing.  Discussed using good arch support.  Vivi Barrack DPM

## 2022-10-04 HISTORY — PX: SHOULDER SURGERY: SHX246

## 2022-10-04 NOTE — Telephone Encounter (Signed)
Received signed patient form.[name] [name] on Call Enrollment Form  Fax: [phone] Phone: [phone]  Chesley Mires, PharmD, MPH, BCPS, CPP Clinical Pharmacist (Rheumatology and Pulmonology)

## 2022-10-14 ENCOUNTER — Other Ambulatory Visit: Payer: Self-pay | Admitting: Physician Assistant

## 2022-10-14 NOTE — Telephone Encounter (Signed)
Last Fill: 05/21/2022  Next Visit: 01/11/2023  Last Visit: 08/03/2022  Dx: Primary osteoarthritis of both knees   Current Dose per office note on 08/03/2022: not mentioned  Okay to refill Lidoderm?

## 2022-10-18 ENCOUNTER — Inpatient Hospital Stay (HOSPITAL_COMMUNITY): Admission: RE | Admit: 2022-10-18 | Payer: BC Managed Care – PPO | Source: Ambulatory Visit

## 2022-10-20 ENCOUNTER — Encounter: Payer: Self-pay | Admitting: Rheumatology

## 2022-10-20 ENCOUNTER — Telehealth: Payer: Self-pay | Admitting: Rheumatology

## 2022-10-20 NOTE — Telephone Encounter (Unsigned)
[name] [name] with [name] [employer] left a voicemail stating they received an enrollment form for the patient and they are missing some information.  In particular, the billing address.  There is also some confusion on where the infusion is taking place.  If its at [facility] or at another facility.  Please call back at #[phone]

## 2022-10-21 NOTE — Telephone Encounter (Signed)
Returned call to [employer] Per rep, they need correct address for where to send reimbursement. They have a [employer] address on file. Megan with patient advocate team wil lcall to confirm this.  Phone: [phone]  Chesley Mires, PharmD, MPH, BCPS, CPP Clinical Pharmacist (Rheumatology and Pulmonology)

## 2022-10-25 ENCOUNTER — Other Ambulatory Visit: Payer: Self-pay | Admitting: Pharmacist

## 2022-10-25 ENCOUNTER — Encounter (HOSPITAL_COMMUNITY)
Admission: RE | Admit: 2022-10-25 | Discharge: 2022-10-25 | Disposition: A | Discharge status: Home / Self Care | Payer: BC Managed Care – PPO | Source: Ambulatory Visit | Attending: Rheumatology | Admitting: Rheumatology

## 2022-10-25 ENCOUNTER — Telehealth: Payer: Self-pay

## 2022-10-25 DIAGNOSIS — Z79899 Other long term (current) drug therapy: Secondary | ICD-10-CM

## 2022-10-25 DIAGNOSIS — M0579 Rheumatoid arthritis with rheumatoid factor of multiple sites without organ or systems involvement: Secondary | ICD-10-CM | POA: Diagnosis present

## 2022-10-25 MED ORDER — ACETAMINOPHEN 325 MG PO TABS
ORAL_TABLET | ORAL | Status: AC
  Administered 2022-10-25: 650 mg via ORAL
  Filled 2022-10-25: qty 2

## 2022-10-25 MED ORDER — DIPHENHYDRAMINE HCL 25 MG PO CAPS: 25.0000 mg | ORAL_CAPSULE | ORAL | Status: DC

## 2022-10-25 MED ORDER — DIPHENHYDRAMINE HCL 25 MG PO CAPS
ORAL_CAPSULE | ORAL | Status: AC
  Administered 2022-10-25: 25 mg via ORAL
  Filled 2022-10-25: qty 1

## 2022-10-25 MED ORDER — SODIUM CHLORIDE 0.9 % IV SOLN
1000.0000 mg | INTRAVENOUS | Status: DC
  Administered 2022-10-25: 1000 mg via INTRAVENOUS
  Filled 2022-10-25: qty 40

## 2022-10-25 MED ORDER — ACETAMINOPHEN 325 MG PO TABS: 650.0000 mg | ORAL_TABLET | ORAL | Status: DC

## 2022-10-25 NOTE — Telephone Encounter (Signed)
[name] [name] contacted the office to state the Infusion Clinic needs clearance from our office now. [name] states she would like clarification that  Dr. Corliss Skains knows about surgery and infusions and is okay to proceed with the Orencia Infusions for today. Call back number is [phone]. Please advise.

## 2022-10-25 NOTE — Telephone Encounter (Signed)
Spoke with [name] [name] to clarify. [name] wanted to ensure that I work in the Rheumatology office. I advised [name] [name] that I do and she advised the infusion is cleared. She also spoke with Dion Saucier, PA from [facility]. The patient is cleared to receive infusion today.

## 2022-10-25 NOTE — Progress Notes (Addendum)
Client advised needs surgical clearance for Orencia and voiced understanding

## 2022-10-25 NOTE — Telephone Encounter (Signed)
She will require surgical clearance from her surgeon prior to resuming orencia.

## 2022-10-25 NOTE — Progress Notes (Signed)
Next infusion scheduled for Orencia IV on 11/22/22 and due for updated orders. Diagnosis: RA  Dose: 1000mg  every 4 weeks (appropriate based on last recorded weight of 119.7kg)  Last Clinic Visit: 08/03/22 Next Clinic Visit: 01/11/23  Last infusion: 10/25/2022  Labs: CBC and CMP on 09/20/22 TB Gold: negative on 09/20/2022   Orders placed for Orencia IV x 3 doses along with premedication of acetaminophen and diphenhydramine to be administered 30 minutes before medication infusion.  Standing CBC with diff/platelet and CMP with GFR orders placed to be drawn every 2 months.  Next TB gold due 09/20/2023  Chesley Mires, PharmD, MPH, BCPS, CPP Clinical Pharmacist (Rheumatology and Pulmonology)

## 2022-10-25 NOTE — Telephone Encounter (Signed)
[name] [name] contacted the office from the [facility] inquiring about the mutual patient. [name] [name] states the patient is supposed to get Orencia infusion 1,000 MG every 28 days. [name] [name] states the patient got an infusion in January and then did not get another infusion until April. [name] [name] states the patient has an infusion appointment today at eleven o'clock. [name] [name] states the patient states she had shoulder surgery 21 days ago. [name] [name] inquires if the patient can still get the Orencia infusion today or should the patient wait longer. [name] [name]'s call back number is [phone]. Please advise.

## 2022-10-25 NOTE — Telephone Encounter (Signed)
Please clarify

## 2022-10-25 NOTE — Telephone Encounter (Signed)
Received clearance letter from [facility] to resume Orencia infusions. This has been faxed to media tab of patient's chart.  Chesley Mires, PharmD, MPH, BCPS, CPP Clinical Pharmacist (Rheumatology and Pulmonology)

## 2022-10-25 NOTE — Telephone Encounter (Signed)
Dion Saucier, PA from [facility] called and gave verbal clearance that patient is cleared from a surgical standpoint from their office to proceed with Orencia infusions.  I called Melanie at the [facility] and advised of the clearance.

## 2022-10-25 NOTE — Telephone Encounter (Signed)
[name] called the office back and advised her that patient will require surgical clearance from her surgeon prior to resuming Orencia.

## 2022-11-02 ENCOUNTER — Other Ambulatory Visit: Payer: Self-pay | Admitting: Physician Assistant

## 2022-11-03 ENCOUNTER — Telehealth: Payer: Self-pay

## 2022-11-03 NOTE — Telephone Encounter (Signed)
Patient has questions regarding orencia copay assistance approval. She states that she received notification she was approved as of April but would like to know if that could be retro dated to January due to insurance out of pocket? Please advise.

## 2022-11-03 NOTE — Telephone Encounter (Signed)
Ok to increase flexeril back to 10 mg at bedtime as needed for muscle spasms.

## 2022-11-03 NOTE — Telephone Encounter (Signed)
Flexeril prescription was discontinued on 10/01/2022 by another office please clarify if she is still taking Flexeril.

## 2022-11-03 NOTE — Telephone Encounter (Signed)
Last Fill: 08/03/2022- Voltaren gel, gabapentin  07/19/2022- flexeril  Next Visit: 01/11/2023  Last Visit: 08/03/2022  DX: Fibromyalgia, Primary osteoarthritis of both knees   Current Dose per office note on 08/03/2022: She plans on remaining on gabapentin and Flexeril as prescribed. She is Voltaren gel topically as needed for pain relief.   Okay to refill flexeril, voltaren gel and gabapentin?

## 2022-11-03 NOTE — Telephone Encounter (Signed)
Patient states she is still taking flexeril but needs the 10mg  instead of 5mg . I advised patient that we have not filled the flexeril 10mg  since 03/20/2021. Patient states she does not know why it was ever reduced to 5mg  as she needs the 10mg .

## 2022-11-03 NOTE — Telephone Encounter (Signed)
Please review and send prescriptions. Thanks!

## 2022-11-04 ENCOUNTER — Other Ambulatory Visit: Payer: Self-pay | Admitting: Cardiology

## 2022-11-12 NOTE — Telephone Encounter (Signed)
Reached out to pt to discuss, she asked if she could give me a call back on Monday. Offered to provider her with my direct office number, however she was driving and was unable to write it down. Requested that I call her right back and leave the number in her VM box. Will await f/u.

## 2022-11-15 ENCOUNTER — Encounter (HOSPITAL_COMMUNITY): Payer: BC Managed Care – PPO

## 2022-11-22 ENCOUNTER — Ambulatory Visit (HOSPITAL_COMMUNITY)
Admission: RE | Admit: 2022-11-22 | Discharge: 2022-11-22 | Disposition: A | Discharge status: Home / Self Care | Payer: BC Managed Care – PPO | Source: Ambulatory Visit | Attending: Rheumatology | Admitting: Rheumatology

## 2022-11-22 DIAGNOSIS — M0579 Rheumatoid arthritis with rheumatoid factor of multiple sites without organ or systems involvement: Secondary | ICD-10-CM | POA: Diagnosis present

## 2022-11-22 DIAGNOSIS — Z79899 Other long term (current) drug therapy: Secondary | ICD-10-CM

## 2022-11-22 LAB — CBC WITH DIFFERENTIAL/PLATELET
Abs Immature Granulocytes: 0.05 10*3/uL (ref 0.00–0.07)
Basophils Absolute: 0.1 10*3/uL (ref 0.0–0.1)
Basophils Relative: 1 %
Eosinophils Absolute: 0.2 10*3/uL (ref 0.0–0.5)
Eosinophils Relative: 3 %
HCT: 41.4 % (ref 36.0–46.0)
Hemoglobin: 12.8 g/dL (ref 12.0–15.0)
Immature Granulocytes: 1 %
Lymphocytes Relative: 41 %
Lymphs Abs: 3.8 10*3/uL (ref 0.7–4.0)
MCH: 26.5 pg (ref 26.0–34.0)
MCHC: 30.9 g/dL (ref 30.0–36.0)
MCV: 85.7 fL (ref 80.0–100.0)
Monocytes Absolute: 0.7 10*3/uL (ref 0.1–1.0)
Monocytes Relative: 7 %
Neutro Abs: 4.4 10*3/uL (ref 1.7–7.7)
Neutrophils Relative %: 47 %
Platelets: 269 10*3/uL (ref 150–400)
RBC: 4.83 MIL/uL (ref 3.87–5.11)
RDW: 12.8 % (ref 11.5–15.5)
WBC: 9.1 10*3/uL (ref 4.0–10.5)
nRBC: 0 % (ref 0.0–0.2)

## 2022-11-22 MED ORDER — ACETAMINOPHEN 325 MG PO TABS
650.0000 mg | ORAL_TABLET | ORAL | Status: DC
  Administered 2022-11-22: 650 mg via ORAL

## 2022-11-22 MED ORDER — DIPHENHYDRAMINE HCL 25 MG PO CAPS
25.0000 mg | ORAL_CAPSULE | ORAL | Status: DC
  Administered 2022-11-22: 25 mg via ORAL

## 2022-11-22 MED ORDER — ACETAMINOPHEN 325 MG PO TABS
ORAL_TABLET | ORAL | Status: AC
  Filled 2022-11-22: qty 2

## 2022-11-22 MED ORDER — DIPHENHYDRAMINE HCL 25 MG PO CAPS
ORAL_CAPSULE | ORAL | Status: AC
  Filled 2022-11-22: qty 1

## 2022-11-22 MED ORDER — SODIUM CHLORIDE 0.9 % IV SOLN
1000.0000 mg | INTRAVENOUS | Status: DC
  Administered 2022-11-22: 1000 mg via INTRAVENOUS
  Filled 2022-11-22: qty 40

## 2022-11-22 NOTE — Progress Notes (Signed)
CBC WNL

## 2022-11-27 ENCOUNTER — Emergency Department (HOSPITAL_COMMUNITY): Payer: BC Managed Care – PPO

## 2022-11-27 ENCOUNTER — Encounter (HOSPITAL_COMMUNITY): Payer: Self-pay | Admitting: Emergency Medicine

## 2022-11-27 ENCOUNTER — Emergency Department (HOSPITAL_COMMUNITY)
Admission: EM | Admit: 2022-11-27 | Discharge: 2022-11-27 | Disposition: A | Discharge status: Home / Self Care | Payer: BC Managed Care – PPO | Attending: Emergency Medicine | Admitting: Emergency Medicine

## 2022-11-27 ENCOUNTER — Other Ambulatory Visit: Payer: Self-pay

## 2022-11-27 DIAGNOSIS — Z7982 Long term (current) use of aspirin: Secondary | ICD-10-CM | POA: Diagnosis not present

## 2022-11-27 DIAGNOSIS — I2510 Atherosclerotic heart disease of native coronary artery without angina pectoris: Secondary | ICD-10-CM | POA: Diagnosis not present

## 2022-11-27 DIAGNOSIS — G44209 Tension-type headache, unspecified, not intractable: Secondary | ICD-10-CM

## 2022-11-27 DIAGNOSIS — R519 Headache, unspecified: Secondary | ICD-10-CM | POA: Diagnosis present

## 2022-11-27 DIAGNOSIS — R200 Anesthesia of skin: Secondary | ICD-10-CM | POA: Insufficient documentation

## 2022-11-27 LAB — DIFFERENTIAL
Abs Immature Granulocytes: 0.04 10*3/uL (ref 0.00–0.07)
Basophils Absolute: 0.1 10*3/uL (ref 0.0–0.1)
Basophils Relative: 1 %
Eosinophils Absolute: 0.2 10*3/uL (ref 0.0–0.5)
Eosinophils Relative: 2 %
Immature Granulocytes: 0 %
Lymphocytes Relative: 47 %
Lymphs Abs: 4.5 10*3/uL — ABNORMAL HIGH (ref 0.7–4.0)
Monocytes Absolute: 0.7 10*3/uL (ref 0.1–1.0)
Monocytes Relative: 7 %
Neutro Abs: 4 10*3/uL (ref 1.7–7.7)
Neutrophils Relative %: 43 %

## 2022-11-27 LAB — COMPREHENSIVE METABOLIC PANEL
ALT: 17 U/L (ref 0–44)
AST: 24 U/L (ref 15–41)
Albumin: 2.9 g/dL — ABNORMAL LOW (ref 3.5–5.0)
Alkaline Phosphatase: 65 U/L (ref 38–126)
Anion gap: 12 (ref 5–15)
BUN: 10 mg/dL (ref 6–20)
CO2: 25 mmol/L (ref 22–32)
Calcium: 8.9 mg/dL (ref 8.9–10.3)
Chloride: 103 mmol/L (ref 98–111)
Creatinine, Ser: 0.9 mg/dL (ref 0.44–1.00)
GFR, Estimated: >60 mL/min (ref >60)
Glucose, Bld: 95 mg/dL (ref 70–99)
Potassium: 4 mmol/L (ref 3.5–5.1)
Sodium: 140 mmol/L (ref 135–145)
Total Bilirubin: 0.3 mg/dL (ref 0.3–1.2)
Total Protein: 6.2 g/dL — ABNORMAL LOW (ref 6.5–8.1)

## 2022-11-27 LAB — CBC
HCT: 42.8 % (ref 36.0–46.0)
Hemoglobin: 13.3 g/dL (ref 12.0–15.0)
MCH: 26.3 pg (ref 26.0–34.0)
MCHC: 31.1 g/dL (ref 30.0–36.0)
MCV: 84.8 fL (ref 80.0–100.0)
Platelets: 268 10*3/uL (ref 150–400)
RBC: 5.05 MIL/uL (ref 3.87–5.11)
RDW: 12.6 % (ref 11.5–15.5)
WBC: 9.4 10*3/uL (ref 4.0–10.5)
nRBC: 0 % (ref 0.0–0.2)

## 2022-11-27 LAB — ETHANOL: Alcohol, Ethyl (B): <10 mg/dL (ref <10)

## 2022-11-27 LAB — PROTIME-INR
INR: 1 (ref 0.8–1.2)
Prothrombin Time: 13.3 seconds (ref 11.4–15.2)

## 2022-11-27 LAB — APTT: aPTT: 35 seconds (ref 24–36)

## 2022-11-27 MED ORDER — OXYCODONE-ACETAMINOPHEN 5-325 MG PO TABS
1.0000 | ORAL_TABLET | Freq: Once | ORAL | Status: AC
  Administered 2022-11-27: 1 via ORAL
  Filled 2022-11-27: qty 1

## 2022-11-27 MED ORDER — KETOROLAC TROMETHAMINE 15 MG/ML IJ SOLN
15.0000 mg | Freq: Once | INTRAMUSCULAR | Status: AC
  Administered 2022-11-27: 15 mg via INTRAVENOUS
  Filled 2022-11-27: qty 1

## 2022-11-27 MED ORDER — LORAZEPAM 2 MG/ML IJ SOLN
1.0000 mg | Freq: Once | INTRAMUSCULAR | Status: AC | PRN
  Administered 2022-11-27: 1 mg via INTRAVENOUS
  Filled 2022-11-27: qty 1

## 2022-11-27 NOTE — ED Triage Notes (Signed)
Per [facility] pt coming from home c/o headache x 3 days. Also having issues with hypertension and started on 10mg  propanolol Tuesday. Reports left side of face and left hand feeling funny over past couple days. Patient also feels like her words are coming out slower.

## 2022-11-27 NOTE — Discharge Instructions (Addendum)
We evaluated you for your headache and your shoulder pain.  We obtained an MRI of your brain which did not show any signs of bleeding, stroke, or tumor.  Your MRI showed some signs of elevated intracranial pressure, this can sometimes be associated with some types of headaches but usually with visual changes.  I would recommend following up with a neurologist.  Your MRI also showed a cyst in your nose. This was seen on a prior MRI in 2020 so I don't think this is anything dangerous, but please talk to your primary doctor about this.  Your shoulder x-ray did not show any sign of a broken bone, dislocation.  Please follow-up with your orthopedic surgeon.  You may need an MRI to see if there are any new injuries to a tendon or ligament.  Please also follow-up with your primary doctor. Please take Tylenol and Motrin for your symptoms at home.  You can take 1000 mg of Tylenol every 6 hours and 600 mg of ibuprofen every 6 hours as needed for your symptoms.  You can take these medicines together as needed, either at the same time, or alternating every 3 hours.  Please return if you develop any new or worsening headaches, vision changes, weakness, trouble swallowing or trouble speaking, confusion, or any other concerning symptoms.

## 2022-11-27 NOTE — ED Provider Notes (Signed)
[place] EMERGENCY DEPARTMENT AT [facility] Provider Note  CSN: [id number] Arrival date & time: 11/27/22 1241  Chief Complaint(s) Headache  HPI [name] [name] is a 57 y.o. female history of anxiety, coronary artery disease, obesity, rheumatoid arthritis presenting to the emergency department with headache, numbness.  Patient reports that she developed a headache for the past 3 days, also had some numbness over her left face and left hand.  She reports that she felt like her speech was funny earlier as well but has improved.  She reports some chronic left arm weakness due to rotator cuff issues but nothing new today.  No fevers or chills, neck stiffness.  No head trauma.  Reports recently started on blood pressure medication for high blood pressure.  No vision changes.  No clumsiness, trouble swallowing.  No trouble with balance or vertigo symptoms.  Symptoms mild.  She reports that she has been a lot more anxious than normal over her son's birthday who is deceased.   Past Medical History Past Medical History:  Diagnosis Date   Anxiety    Bursitis    CAD (coronary artery disease), native coronary artery    Coronary CTA showed minimal calcified plaque in the proximal and mid LAD less than 25%, mild left atrial enlargement and a coronary calcium score to 73 which is 99th percentile for age and sex matched controls by coronary CTA 10-2021   Depression    Diastolic dysfunction    Dyslipidemia 01/30/2015   Esophageal ring    Fibromyalgia    Hiatal hernia    HSV-1 infection    Morbid obesity (HCC)    Osteoarthritis    Rheumatoid arthritis(714.0)    M05.79   Rheumatoid arthritis, seropositive, 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HCC)    Treated with Orencia, TB neg 09/12/2015   Spondylolysis    Patient Active Problem List   Diagnosis Date Noted   Amenorrhea 06/09/2020   Anxiety 02/20/2018   Fatigue 02/20/2018   Low blood potassium 02/20/2018   Allergic rhinitis 12/14/2017   Fever  blister 12/14/2017   Head congestion 12/14/2017   Hyperlipidemia 12/14/2017   Lateral epicondylitis of right elbow 05/04/2017   Sacral pain 03/23/2017   High risk medication use 08/03/2016   Fibromyalgia syndrome 08/03/2016   Abnormal cardiac CT angiography    Sacrococcygeal pain 02/20/2016   Rheumatoid arthritis involving 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HCC) 01/24/2016   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   Dyslipidemia 01/30/2015   MDD (major depressive disorder), recurrent severe, 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   Essential hypertension 08/25/2014   Morbid obesity (HCC) 08/25/2014   CAD (coronary artery disease), native coronary artery 08/24/2014   Numbness and tingling in left arm    Arm numbness left 08/22/2014   Chest pain 08/22/2014   Gastroesophageal reflux disease without esophagitis 07/23/2014   Coronary artery calcification seen on CAT scan 02/19/2014   Hip pain 10/23/2013   Solitary pulmonary nodule 08/28/2013   Chest pain, atypical 07/18/2013   Heart palpitations 06/27/2013   Cough 06/04/2013   Diastolic dysfunction 05/18/2013   SOB (shortness of breath) 05/09/2013   Other malaise and fatigue 01/21/2013   Stress and adjustment reaction 12/09/2012   Right sided abdominal pain 11/29/2012   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/21/2012   Morbid obesity with BMI of 50.0-59.9, adult (HCC) 11/21/2012   Arthritis 07/13/2012   Depression 07/13/2012   Routine health maintenance 05/07/2012   Migraine 06/07/1998   Home Medication(s) Prior to Admission medications   Medication Sig Start Date End  Date Taking? Authorizing Provider  Abatacept (ORENCIA IV) Inject 1,000 mg into the vein every 28 (twenty-eight) days.    Pollyann Savoy, MD  acetaminophen-codeine (TYLENOL #3) 300-30 MG tablet Take 1 tablet by mouth every 6 (six) hours as needed for moderate pain. Patient not taking: Reported on 08/03/2022 09/21/21   Kirtland Bouchard, PA-C  acyclovir ointment (ZOVIRAX) 5 % as  needed. Patient not taking: Reported on 08/03/2022    [provider]  Acyclovir-Hydrocortisone (XERESE) 5-1 % CREA as needed.    [provider]  Adapalene 0.3 % gel Apply 1 application topically daily as needed (acne).  12/31/14   [provider]  albuterol (VENTOLIN HFA) 108 (90 Base) MCG/ACT inhaler Inhale into the lungs. 06/30/22   [provider]  Alcohol Swabs (ALCOHOL WIPES) 70 % PADS Alcohol Prep Pads    [provider]  ALPRAZolam (XANAX) 0.5 MG tablet TAKE 1 TABLET BY MOUTH NIGHTLY AS NEEDED FOR ANXIETY/SLEEP INDICATIONS ANXIOUS, INSOMNIA GRIEVING.    [provider]  ARIPiprazole (ABILIFY) 2 MG tablet Take 2 mg by mouth daily. 09/25/21   [provider]  aspirin EC 81 MG tablet Take 1 tablet (81 mg total) by mouth daily. Swallow whole. 10/13/21   Quintella Reichert, MD  atorvastatin (LIPITOR) 80 MG tablet TAKE 1 TABLET BY MOUTH EVERY DAY AT 6PM    [provider]  Azelastine HCl 137 MCG/SPRAY SOLN  08/07/19   [provider]  benzonatate (TESSALON) 100 MG capsule Take 1 capsule (100 mg total) by mouth 2 (two) times daily as needed for cough. Patient not taking: Reported on 08/03/2022 04/15/21   Freddy Finner, NP  benzonatate (TESSALON) 200 MG capsule Take by mouth. 06/22/22   [provider]  betamethasone, augmented, (DIPROLENE) 0.05 % lotion Apply topically as needed. 01/23/22   [provider]  buPROPion (WELLBUTRIN XL) 150 MG 24 hr tablet Take 450 mg by mouth every morning. Patient not taking: Reported on 08/03/2022 05/23/19   [provider]  buPROPion (WELLBUTRIN XL) 150 MG 24 hr tablet Take 3 tablets by mouth every morning.    [provider]  cetirizine (ZYRTEC ALLERGY) 10 MG tablet Take 1 tablet (10 mg total) by mouth daily. 10/08/21   Wallis Bamberg, PA-C  Cetirizine HCl (ZYRTEC ALLERGY PO) 10 mg as needed.    [provider]  cetirizine-pseudoephedrine (ZYRTEC-D) 5-120  MG tablet Take 1 tablet by mouth 2 (two) times daily. Patient not taking: Reported on 01/27/2022 07/03/21   Bennie Pierini, FNP  ciclopirox (LOPROX) 0.77 % cream as needed.    [provider]  Ciclopirox 1 % shampoo Apply topically as needed. 01/25/22   [provider]  cyclobenzaprine (FLEXERIL) 10 MG tablet Take 1 tablet (10 mg total) by mouth at bedtime as needed for muscle spasms. 11/03/22   Gearldine Bienenstock, PA-C  diazepam (VALIUM) 5 MG tablet Take 1 tablet every day by oral route.    [provider]  diclofenac Sodium (VOLTAREN) 1 % GEL APPLY 2-4 GRAMS TO AFFECTED JOINT 4 TIMES DAILY AS NEEDED. 11/03/22   Gearldine Bienenstock, PA-C  diltiazem (CARDIZEM CD) 180 MG 24 hr capsule Take 1 capsule (180 mg total) by mouth daily. 12/15/21   Quintella Reichert, MD  doxepin (SINEQUAN) 25 MG capsule Take 25 mg by mouth at bedtime as needed. 04/28/19   [provider]  EPINEPHrine 0.3 mg/0.3 mL IJ SOAJ injection  07/02/19   [provider]  estradiol (ESTRACE)  0.5 MG tablet Take 0.5 mg by mouth daily. Patient not taking: Reported on 08/03/2022 09/16/21   [provider]  estradiol (ESTRACE) 1 MG tablet Take by mouth. 07/17/22   [provider]  ezetimibe (ZETIA) 10 MG tablet Take 1 tablet (10 mg total) by mouth daily. Patient not taking: Reported on 08/03/2022 12/15/21   Quintella Reichert, MD  famciclovir (FAMVIR) 500 MG tablet Take 15,000 mg by mouth daily as needed (fever blister).  01/21/15   [provider]  famotidine (PEPCID) 20 MG tablet Take 20 mg by mouth as needed. 09/22/21   [provider]  famotidine (PEPCID) 20 MG tablet daily. Patient not taking: Reported on 08/03/2022    [provider]  fluticasone (FLONASE) 50 MCG/ACT nasal spray Place 1 spray into both nostrils daily for 3 days. 11/11/21 11/14/21  Gustavus Bryant, FNP  fluticasone (FLONASE) 50 MCG/ACT nasal spray as needed.    [provider]  Folic Acid-Vit  B6-Vit B12 (FABB) 2.2-25-1 MG TABS Take 1 tablet by mouth daily. 08/03/22   Gearldine Bienenstock, PA-C  gabapentin (NEURONTIN) 300 MG capsule TAKE 1 CAPSULE BY MOUTH 3 TIMES A DAY AS NEEDED FOR PAIN 11/03/22   Gearldine Bienenstock, PA-C  hydrocortisone 2.5 % cream SMARTSIG:1 Topical Every Night 11/06/19   [provider]  hydrOXYzine (VISTARIL) 25 MG capsule Take 25 mg by mouth as needed. 10/04/21   [provider]  ketoconazole (NIZORAL) 2 % cream  03/14/20   [provider]  ketoconazole (NIZORAL) 2 % shampoo  06/03/20   [provider]  levothyroxine (SYNTHROID) 25 MCG tablet as needed. Patient not taking: Reported on 08/03/2022    [provider]  lidocaine (LIDODERM) 5 % Apply 1 patch topically as needed.    [provider]  lidocaine (LIDODERM) 5 % APPLY 1 PATCH TO AFFECTED AREA FOR 12 HOURS IN A 24 HOUR PERIOD 10/14/22   Gearldine Bienenstock, PA-C  LINZESS 145 MCG CAPS capsule Take 145 mcg by mouth daily. 01/26/22   [provider]  methocarbamol (ROBAXIN) 500 MG tablet Take 1 tablet 3 times a day by oral route. 09/24/22   [provider]  Methotrexate, PF, (RASUVO) 25 MG/0.5ML SOAJ INJECT ONE PEN SUBCUTANEOUSLY ONCE EVERY WEEK. STORE AT ROOM TEMPERATURE BETWEEN 68 - 77 DEGREES F. 08/03/22   Gearldine Bienenstock, PA-C  metoprolol tartrate (LOPRESSOR) 100 MG tablet Take 1 tablet (100 mg total) by mouth once for 1 dose. Take 1 tablet (100 mg total) two hours prior to CT scan. 10/13/21 10/13/21  Quintella Reichert, MD  neomycin-polymyxin-hydrocortisone (CORTISPORIN) 3.5-10000-1 OTIC suspension as needed.    [provider]  nitroGLYCERIN (NITROSTAT) 0.4 MG SL tablet Place 1 tablet (0.4 mg total) under the tongue every 5 (five) minutes as needed for chest pain. 11/04/22   Quintella Reichert, MD  [facility] powder Apply topically. 03/17/20   [provider]  nystatin cream (MYCOSTATIN) Apply topically. 01/23/22   [provider]  Oxymetazoline HCl  (NASAL SPRAY) 0.05 % SOLN Nasal Decongestant (oxymetazoline) 0.05 % spray    [provider]  pantoprazole (PROTONIX) 40 MG tablet Take by mouth. 09/22/21   [provider]  predniSONE (DELTASONE) 20 MG tablet Take 2 tablets daily with breakfast. Patient not taking: Reported on 01/27/2022 10/08/21   Wallis Bamberg, PA-C  progesterone (PROMETRIUM) 100 MG capsule Take 100 mg by mouth daily. 09/16/21   [provider]  PROGESTERONE PO 100 mg daily. Patient not  taking: Reported on 08/03/2022    [provider]  promethazine-dextromethorphan (PROMETHAZINE-DM) 6.25-15 MG/5ML syrup Take 5 mLs by mouth 4 (four) times daily as needed for cough. Patient not taking: Reported on 08/03/2022 07/13/22   Ward, Tylene Fantasia, PA-C  propranolol (INDERAL) 10 MG tablet Take 10 mg by mouth daily. Patient not taking: Reported on 08/03/2022    [provider]  propranolol (INDERAL) 20 MG tablet Take by mouth. 04/30/22   [provider]  pseudoephedrine (SUDAFED) 60 MG tablet Take 1 tablet (60 mg total) by mouth every 8 (eight) hours as needed for congestion. Patient not taking: Reported on 08/03/2022 10/08/21   Wallis Bamberg, PA-C  PULMICORT FLEXHALER 180 MCG/ACT inhaler Inhale into the lungs. 06/30/22   [provider]  rizatriptan (MAXALT) 10 MG tablet Take 1 tablet (10 mg total) by mouth as needed for migraine. May repeat in 2 hours if needed Patient not taking: Reported on 08/03/2022 04/15/21   Freddy Finner, NP  triamcinolone (KENALOG) 0.1 % paste Use as directed 1 application. in the mouth or throat 2 (two) times daily. Apply to mouth sores Patient not taking: Reported on 08/03/2022 11/11/21   Gustavus Bryant, FNP  Triamcinolone Acetonide 0.025 % LOTN as needed.    [provider]  valACYclovir (VALTREX) 500 MG tablet Take 500 mg by mouth as needed. 09/17/21   [provider]  Vitamin D, Ergocalciferol, (DRISDOL) 1.25 MG (50000 UT) CAPS capsule Take 50,000  Units by mouth See admin instructions. Take 1 tablet (50000 units) by mouth every Monday, Wednesday, Saturday    [provider]                                                                                                                                    Past Surgical History Past Surgical History:  Procedure Laterality Date   CARDIAC CATHETERIZATION N/A 06/11/2016   Procedure: Left Heart Cath and Coronary Angiography;  Surgeon: Corky Crafts, MD;  Location: [facility] INVASIVE CV LAB;  Service: Cardiovascular;  Laterality: N/A;   CESAREAN SECTION     COLONOSCOPY  07/27/2017   ESOPHAGEAL MANOMETRY N/A 10/28/2014   Procedure: ESOPHAGEAL MANOMETRY (EM);  Surgeon: Hilarie Fredrickson, MD;  Location: [facility] ENDOSCOPY;  Service: Endoscopy;  Laterality: N/A;   HERNIA REPAIR     reports surgery on 3 hernias, with 2 more present   LAPAROSCOPIC GASTRIC SLEEVE RESECTION  11/21/12   [facility]   LEFT HEART CATHETERIZATION WITH CORONARY ANGIOGRAM N/A 08/26/2014   Procedure: LEFT HEART CATHETERIZATION WITH CORONARY ANGIOGRAM;  Surgeon: Runell Gess, MD;  Location: [facility] CATH LAB;  Service: Cardiovascular;  Laterality: N/A;   TUBAL LIGATION     Family History Family History  Problem Relation Age of Onset   Hyperlipidemia Mother    Depression Mother    Sarcoidosis Father    Lung disease Father        Pleural  Mesothelioma   Cancer Father    Heart attack Paternal Grandmother    Hypertension Paternal Grandmother    Hypertension Maternal Grandmother    Diabetes Maternal Grandmother    Stroke Neg Hx    Colon cancer Neg Hx    Esophageal cancer Neg Hx    Rectal cancer Neg Hx    Stomach cancer Neg Hx     Social History Social History   Tobacco Use   Smoking status: Never    Passive exposure: Never   Smokeless tobacco: Never  Vaping Use   Vaping Use: Never used  Substance Use Topics   Alcohol use: No   Drug use: No   Allergies Influenza vaccines, Influenza virus vaccine, Amitriptyline, and  Isosorbide  Review of Systems Review of Systems  All other systems reviewed and are negative.   Physical Exam Vital Signs  I have reviewed the triage vital signs BP 127/83   Pulse 63   Temp 98.4 F (36.9 C) (Oral)   Resp 16   Ht 5\' 2"  (1.575 m)   Wt 119.7 kg   SpO2 100%   BMI 48.29 kg/m  Physical Exam Vitals and nursing note reviewed.  Constitutional:      General: She is not in acute distress.    Appearance: She is well-developed.  HENT:     Head: Normocephalic and atraumatic.     Mouth/Throat:     Mouth: Mucous membranes are moist.  Eyes:     Pupils: Pupils are equal, round, and reactive to light.  Cardiovascular:     Rate and Rhythm: Normal rate and regular rhythm.     Heart sounds: No murmur heard. Pulmonary:     Effort: Pulmonary effort is normal. No respiratory distress.     Breath sounds: Normal breath sounds.  Abdominal:     General: Abdomen is flat.     Palpations: Abdomen is soft.     Tenderness: There is no abdominal tenderness.  Musculoskeletal:        General: No tenderness.     Right lower leg: No edema.     Left lower leg: No edema.  Skin:    General: Skin is warm and dry.  Neurological:     General: No focal deficit present.     Mental Status: She is alert. Mental status is at baseline.     Comments: Cranial nerves II through XII intact with subjective sensory abnormality over the left face, strength 5 out of 5 in the bilateral upper and lower extremities, no sensory deficit to light touch, no dysmetria on finger-nose-finger testing, ambulatory with steady gait.   Psychiatric:        Mood and Affect: Mood normal.        Behavior: Behavior normal.     ED Results and Treatments Labs (all labs ordered are listed, but only abnormal results are displayed) Labs Reviewed  DIFFERENTIAL - Abnormal; Notable for the following components:      Result Value   Lymphs Abs 4.5 (*)    All other components within normal limits  COMPREHENSIVE METABOLIC  PANEL - Abnormal; Notable for the following components:   Total Protein 6.2 (*)    Albumin 2.9 (*)    All other components within normal limits  PROTIME-INR  APTT  CBC  ETHANOL  Radiology MR BRAIN WO CONTRAST  Result Date: 11/27/2022 CLINICAL DATA:  Mental status change, unknown cause EXAM: MRI HEAD WITHOUT CONTRAST TECHNIQUE: Multiplanar, multiecho pulse sequences of the brain and surrounding structures were obtained without intravenous contrast. COMPARISON:  CT head 11/27/22, MR head 01/02/17, CT maxillofacial 10/27/18 FINDINGS: Brain: Negative for an acute infarct. No hemorrhage. No hydrocephalus. No extra-axial fluid collection. Sequela of mild overall chronic microvascular ischemic change. Enlarged and partially empty sella. Prominent Meckel's cave bilaterally Vascular: Normal flow voids. There is narrowing of the sigmoid/transverse sinus junction, which can be seen in the setting idiopathic intracranial hypertension. Skull and upper cervical spine: Normal marrow signal. Sinuses/Orbits: No middle ear or mastoid. Paranasal sinuses are clear. Orbits are unremarkable. There is mild prominence of the bilateral optic nerve sheaths. Other: There a focal T2 hyperintense posterior midline nasopharynx measuring 1.5.4 cm (series 7, image 4). This is favored to represent cysts. Recommend correlation with direct IMPRESSION: 1. No acute intracranial process. 2. Enlarged and partially empty sella, prominent Meckel's cave bilaterally, and mild prominence of the bilateral optic nerve sheaths. These findings are nonspecific but can be seen in the setting of idiopathic intracranial hypertension. 3. Focal T2 hyperintense posterior midline nasopharynx measuring 1.5.4 cm. 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 Recommend correlation with direct visualization.  Electronically Signed   By: Lorenza Cambridge M.D.   On: 11/27/2022 19:17   DG Shoulder Left  Result Date: 11/27/2022 CLINICAL DATA:  Recurrent left shoulder pain status post rotator cuff surgery one-month ago EXAM: LEFT SHOULDER - 3 VIEW COMPARISON:  Left shoulder radiographs dated 08/02/2022 FINDINGS: There is no evidence of fracture or dislocation. There is no evidence of arthropathy or other focal bone abnormality. Soft tissues are unremarkable. IMPRESSION: No acute fracture or dislocation. Electronically Signed   By: Agustin Cree M.D.   On: 11/27/2022 16:45   CT HEAD WO CONTRAST  Result Date: 11/27/2022 CLINICAL DATA:  Headache EXAM: CT HEAD WITHOUT CONTRAST TECHNIQUE: Contiguous axial images were obtained from the base of the skull through the vertex without intravenous contrast. RADIATION DOSE REDUCTION: This exam was performed according to the departmental dose-optimization program which includes automated exposure control, adjustment of the mA and/or kV according to patient size and/or use of iterative reconstruction technique. COMPARISON:  01/01/2017 FINDINGS: Brain: No evidence of acute infarction, hemorrhage, hydrocephalus, extra-axial collection or mass lesion/mass effect. Vascular: No hyperdense vessel or unexpected calcification. Skull: Normal. Negative for fracture or focal lesion. Sinuses/Orbits: No acute finding. Other: None. IMPRESSION: No acute intracranial pathology. Electronically Signed   By: Jearld Lesch M.D.   On: 11/27/2022 14:18    Pertinent labs & imaging results that were available during my care of the patient were reviewed by me and considered in my medical decision making (see MDM for details).  Medications Ordered in ED Medications  LORazepam (ATIVAN) injection 1 mg (1 mg Intravenous Given 11/27/22 1809)  ketorolac (TORADOL) 15 MG/ML injection 15 mg (15 mg Intravenous Given 11/27/22 2008)  oxyCODONE-acetaminophen (PERCOCET/ROXICET) 5-325 MG per tablet 1 tablet (1 tablet Oral  Given 11/27/22 2012)  Procedures Procedures  (including critical care time)  Medical Decision Making / ED Course   MDM:  57 year old presenting to the emergency department with headache, numbness and tingling.  Patient well-appearing, physical exam with some subjective sensory difference to light touch in the left face but otherwise reassuring.  No speech abnormality noted on exam.  CT head negative.  Labs overall reassuring.  Patient initially with mild hypertension which improved.  No evidence of intracranial bleeding.  Low concern for stroke but given comorbidities and persistent numbness, earlier speech abnormality will obtain MRI to rule out stroke.  If negative, will likely discharge.  Suspect most likely causes headache in the setting of anxiety related to trauma.  Will reassess.    Clinical Course as of 11/27/22 2036  Sat Nov 27, 2022  2034 MRI without evidence of acute stroke, tumor.  Has some findings which could represent idiopathic intracranial hypertension, patient has no visual changes, denies positional headaches, suspect this is less likely but advised follow-up with neurology and return if any vision changes develop or any worsening headaches.  Also has a nasal cyst which has been present on prior CT scans, discussed with patient advised follow-up with primary doctor.  Patient reports she feels better. Will discharge patient to home. All questions answered. Patient comfortable with plan of discharge. Return precautions discussed with patient and specified on the after visit summary.  [WS]    Clinical Course User Index [WS] Suezanne Jacquet, Jerilee Field, MD     Additional history obtained: -Additional history obtained from family and spouse -External records from outside source obtained and reviewed including: Chart review including previous notes,  labs, imaging, consultation notes including prior notes   Lab Tests: -I ordered, reviewed, and interpreted labs.   The pertinent results include:   Labs Reviewed  DIFFERENTIAL - Abnormal; Notable for the following components:      Result Value   Lymphs Abs 4.5 (*)    All other components within normal limits  COMPREHENSIVE METABOLIC PANEL - Abnormal; Notable for the following components:   Total Protein 6.2 (*)    Albumin 2.9 (*)    All other components within normal limits  PROTIME-INR  APTT  CBC  ETHANOL    Notable for normal ethanol, no leukocytosis  EKG   EKG Interpretation  Date/Time:  Saturday November 27 2022 13:04:23 EDT Ventricular Rate:  75 PR Interval:  128 QRS Duration: 82 QT Interval:  404 QTC Calculation: 451 R Axis:   245 Text Interpretation: Normal sinus rhythm Possible Right ventricular hypertrophy Possible Lateral infarct , age undetermined Abnormal ECG When compared with ECG of 12-Feb-2022 16:29, No significant change since last tracing Confirmed by Alvino Blood ([id number]) on 11/27/2022 3:45:13 PM         Imaging Studies ordered: I ordered imaging studies including MRI brain On my interpretation imaging demonstrates no sign of acute stroke I independently visualized and interpreted imaging. I agree with the radiologist interpretation   Medicines ordered and prescription drug management: Meds ordered this encounter  Medications   LORazepam (ATIVAN) injection 1 mg   ketorolac (TORADOL) 15 MG/ML injection 15 mg   oxyCODONE-acetaminophen (PERCOCET/ROXICET) 5-325 MG per tablet 1 tablet    -I have reviewed the patients home medicines and have made adjustments as needed   Cardiac Monitoring: The patient was maintained on a cardiac monitor.  I personally viewed and interpreted the cardiac monitored which showed an underlying rhythm of: NSR  Social Determinants of Health:  Diagnosis or treatment significantly limited  by social determinants of health:  obesity   Reevaluation: After the interventions noted above, I reevaluated the patient and found that their symptoms have improved  Co morbidities that complicate the patient evaluation  Past Medical History:  Diagnosis Date   Anxiety    Bursitis    CAD (coronary artery disease), native coronary artery    Coronary CTA showed minimal calcified plaque in the proximal and mid LAD less than 25%, mild left atrial enlargement and a coronary calcium score to 73 which is 99th percentile for age and sex matched controls by coronary CTA 10-2021   Depression    Diastolic dysfunction    Dyslipidemia 01/30/2015   Esophageal ring    Fibromyalgia    Hiatal hernia    HSV-1 infection    Morbid obesity (HCC)    Osteoarthritis    Rheumatoid arthritis(714.0)    M05.79   Rheumatoid arthritis, seropositive, 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HCC)    Treated with Orencia, TB neg 09/12/2015   Spondylolysis       Dispostion: Disposition decision including need for hospitalization was considered, and patient discharged from emergency department.    Final Clinical Impression(s) / ED Diagnoses Final diagnoses:  Tension headache     This chart was dictated using voice recognition software.  Despite best efforts to proofread,  errors can occur which can change the documentation meaning.    Lonell Grandchild, MD 11/27/22 2036

## 2022-11-27 NOTE — ED Provider Triage Note (Signed)
Emergency Medicine Provider Triage Evaluation Note  [name] [name] , a 57 y.o. female  was evaluated in triage.  Pt complains of left sided headache with left sided facial tingling and tingling into her left fingers for the past 3-4 days. Denies any unilateral weakness.  Review of Systems  Positive:  Negative:   Physical Exam  BP (!) 129/92 (BP Location: Right Wrist)   Pulse 63   Temp 97.6 F (36.4 C)   Resp 14   Ht 5\' 2"  (1.575 m)   Wt 119.7 kg   SpO2 97%   BMI 48.29 kg/m  Gen:   Awake, no distress   Resp:  Normal effort  MSK:   Moves extremities without difficulty  Other:  Cranial nerves grossly intact. Strength grossly intact in the upper and lower extremities.   Medical Decision Making  Medically screening exam initiated at 1:35 PM.  Appropriate orders placed.  [name] [name] was informed that the remainder of the evaluation will be completed by another provider, this initial triage assessment does not replace that evaluation, and the importance of remaining in the ED until their evaluation is complete.  CT head and labs ordered.    [name] Rich, PA-C 11/27/22 1336

## 2022-12-04 ENCOUNTER — Other Ambulatory Visit: Payer: Self-pay | Admitting: Cardiology

## 2022-12-05 ENCOUNTER — Encounter: Payer: Self-pay | Admitting: Podiatry

## 2022-12-06 NOTE — Telephone Encounter (Signed)
Please advise on braces.  Thanks Carney Bern

## 2022-12-07 ENCOUNTER — Other Ambulatory Visit: Payer: Self-pay

## 2022-12-07 ENCOUNTER — Encounter (HOSPITAL_BASED_OUTPATIENT_CLINIC_OR_DEPARTMENT_OTHER): Payer: Self-pay

## 2022-12-07 ENCOUNTER — Emergency Department (HOSPITAL_BASED_OUTPATIENT_CLINIC_OR_DEPARTMENT_OTHER): Payer: BC Managed Care – PPO | Admitting: Radiology

## 2022-12-07 ENCOUNTER — Telehealth: Payer: Self-pay | Admitting: Pharmacy Technician

## 2022-12-07 ENCOUNTER — Emergency Department (HOSPITAL_BASED_OUTPATIENT_CLINIC_OR_DEPARTMENT_OTHER)
Admission: EM | Admit: 2022-12-07 | Discharge: 2022-12-07 | Disposition: A | Discharge status: Home / Self Care | Payer: BC Managed Care – PPO | Attending: Emergency Medicine | Admitting: Emergency Medicine

## 2022-12-07 DIAGNOSIS — F419 Anxiety disorder, unspecified: Secondary | ICD-10-CM | POA: Insufficient documentation

## 2022-12-07 DIAGNOSIS — R000 Tachycardia, unspecified: Secondary | ICD-10-CM | POA: Insufficient documentation

## 2022-12-07 DIAGNOSIS — G44209 Tension-type headache, unspecified, not intractable: Secondary | ICD-10-CM | POA: Diagnosis not present

## 2022-12-07 DIAGNOSIS — I10 Essential (primary) hypertension: Secondary | ICD-10-CM | POA: Diagnosis not present

## 2022-12-07 DIAGNOSIS — I2510 Atherosclerotic heart disease of native coronary artery without angina pectoris: Secondary | ICD-10-CM | POA: Diagnosis not present

## 2022-12-07 DIAGNOSIS — R519 Headache, unspecified: Secondary | ICD-10-CM | POA: Diagnosis present

## 2022-12-07 LAB — BASIC METABOLIC PANEL
Anion gap: 9 (ref 5–15)
BUN: 14 mg/dL (ref 6–20)
CO2: 27 mmol/L (ref 22–32)
Calcium: 9.5 mg/dL (ref 8.9–10.3)
Chloride: 108 mmol/L (ref 98–111)
Creatinine, Ser: 0.84 mg/dL (ref 0.44–1.00)
GFR, Estimated: >60 mL/min (ref >60)
Glucose, Bld: 121 mg/dL — ABNORMAL HIGH (ref 70–99)
Potassium: 3.5 mmol/L (ref 3.5–5.1)
Sodium: 144 mmol/L (ref 135–145)

## 2022-12-07 LAB — CBC
HCT: 46.9 % — ABNORMAL HIGH (ref 36.0–46.0)
Hemoglobin: 14.7 g/dL (ref 12.0–15.0)
MCH: 26.9 pg (ref 26.0–34.0)
MCHC: 31.3 g/dL (ref 30.0–36.0)
MCV: 85.9 fL (ref 80.0–100.0)
Platelets: 268 10*3/uL (ref 150–400)
RBC: 5.46 MIL/uL — ABNORMAL HIGH (ref 3.87–5.11)
RDW: 12.7 % (ref 11.5–15.5)
WBC: 10.3 10*3/uL (ref 4.0–10.5)
nRBC: 0 % (ref 0.0–0.2)

## 2022-12-07 LAB — TROPONIN I (HIGH SENSITIVITY): Troponin I (High Sensitivity): 2 ng/L

## 2022-12-07 MED ORDER — PROCHLORPERAZINE EDISYLATE 10 MG/2ML IJ SOLN
10.0000 mg | Freq: Once | INTRAMUSCULAR | Status: AC
  Administered 2022-12-07: 10 mg via INTRAVENOUS
  Filled 2022-12-07: qty 2

## 2022-12-07 MED ORDER — ACETAMINOPHEN 500 MG PO TABS
1000.0000 mg | ORAL_TABLET | Freq: Once | ORAL | Status: AC
  Administered 2022-12-07: 1000 mg via ORAL
  Filled 2022-12-07: qty 2

## 2022-12-07 MED ORDER — KETOROLAC TROMETHAMINE 15 MG/ML IJ SOLN
15.0000 mg | Freq: Once | INTRAMUSCULAR | Status: AC
  Administered 2022-12-07: 15 mg via INTRAVENOUS
  Filled 2022-12-07: qty 1

## 2022-12-07 MED ORDER — DEXAMETHASONE SODIUM PHOSPHATE 10 MG/ML IJ SOLN
10.0000 mg | Freq: Once | INTRAMUSCULAR | Status: AC
  Administered 2022-12-07: 10 mg via INTRAVENOUS
  Filled 2022-12-07: qty 1

## 2022-12-07 MED ORDER — LACTATED RINGERS IV BOLUS
1000.0000 mL | Freq: Once | INTRAVENOUS | Status: AC
  Administered 2022-12-07: 1000 mL via INTRAVENOUS

## 2022-12-07 MED ORDER — DIPHENHYDRAMINE HCL 50 MG/ML IJ SOLN
50.0000 mg | Freq: Once | INTRAMUSCULAR | Status: AC
  Administered 2022-12-07: 50 mg via INTRAVENOUS
  Filled 2022-12-07: qty 1

## 2022-12-07 NOTE — Telephone Encounter (Signed)
I believe she is referring to the plantar fascial braces. She can come by to get new ones. Thanks!

## 2022-12-07 NOTE — ED Provider Notes (Signed)
[facility] Provider Note   CSN: [id number] Arrival date & time: 12/07/22  1522   History Chief Complaint  Patient presents with   Headache    Headache Associated symptoms: numbness   Associated symptoms: no abdominal pain, no congestion, no cough, no dizziness, no fatigue, no fever, no seizures and no weakness    [name] [name] is a 57 y.o. female PMH significant for fibromyalgia, HTN, CAD, morbid obesity and RA who presented to the ED today with complaints of a HA and elevated BP. It started about two weeks ago, was seen at ED 07-17-24for the same complaint. MRI and CT head did not show a stroke. Possibly hydrocephalous. It was felt that she had tension HA. Her HA is mainly in the temporal region, sometimes it will go around the orbits and radiate to the occipital region and back of her neck. She does note blurriness of vision during the episodes. Of note, she has been under significant stress. Her husband has had cardiac and surgery x2, he was unable to get out of the bathroom and had to help him. She had rotator cuff surgery and on 12/22/2022 it was also her son's death anniversary (was killed).   Patient's recorded medical, surgical, social, medication list and allergies were reviewed in the Snapshot window as part of the initial history.   Review of Systems   Review of Systems  Constitutional:  Negative for chills, fatigue and fever.  HENT:  Negative for congestion.   Eyes:  Positive for visual disturbance.  Respiratory:  Positive for shortness of breath. Negative for cough and chest tightness.   Cardiovascular:  Negative for chest pain and palpitations.  Gastrointestinal:  Negative for abdominal pain.  Genitourinary:  Negative for difficulty urinating, dysuria, frequency, hematuria and urgency.  Musculoskeletal:  Negative for gait problem.  Skin:  Negative for rash.  Neurological:  Positive for numbness and headaches. Negative for  dizziness, seizures, syncope and weakness.  Psychiatric/Behavioral:  The patient is nervous/anxious.     Physical Exam Updated Vital Signs BP 134/82 (BP Location: Right Wrist)   Pulse 77   Temp 98.1 F (36.7 C) (Oral)   Resp 18   Ht 5\' 2"  (1.575 m)   Wt 121.6 kg   SpO2 95%   BMI 49.02 kg/m  Physical Exam Vitals reviewed.  Constitutional:      General: She is not in acute distress.    Appearance: She is not ill-appearing.  HENT:     Head: Normocephalic.     Mouth/Throat:     Mouth: Mucous membranes are moist.     Pharynx: Oropharynx is clear.  Eyes:     Extraocular Movements: Extraocular movements intact.     Right eye: No nystagmus.     Left eye: No nystagmus.     Pupils: Pupils are equal, round, and reactive to light.  Cardiovascular:     Rate and Rhythm: Tachycardia present.     Heart sounds: No murmur heard.    No friction rub. No gallop.  Pulmonary:     Effort: Respiratory distress present.     Breath sounds: No wheezing, rhonchi or rales.  Abdominal:     General: There is no distension.     Tenderness: There is no abdominal tenderness.  Skin:    General: Skin is warm.     Capillary Refill: Capillary refill takes less than 2 seconds.  Neurological:     Mental Status: She is alert  and oriented to person, place, and time.     Cranial Nerves: Cranial nerves 2-12 are intact.     Motor: No weakness, tremor, abnormal muscle tone or pronator drift.     Coordination: Finger-Nose-Finger Test normal.     Gait: Gait is intact.  Psychiatric:        Mood and Affect: Mood is anxious.        Speech: Speech is rapid and pressured.    ED Course/ Medical Decision Making/ A&P Clinical Course as of 12/07/22 2140  Tue Dec 07, 2022  1731 Stable 80 YOF with a chief complaint of headaches HX of chronic pain, morbid obesity, HTN  CCX of bitemporal headaches. 2nd presentation in 10 days. No visual changes. Recent psychosocial stressors.  DDx: IC HTN, Tension, atypical  migraines CVA/ICH less likely due to normal exam/duration DVST in dx but also less likely   AP: Compazine 10Mg s IV with 50mg  DPH IV Tylenol 1000mg s PO Dexamethasone 10mg  IV x1 (Not pregn and ok kidneys) Toradol 15mg  IV 1LR    [CC]    Clinical Course User Index [CC] [name] Ade, MD    Medications Ordered in ED Medications  lactated ringers bolus 1,000 mL (0 mLs Intravenous Stopped 12/07/22 1944)  acetaminophen (TYLENOL) tablet 1,000 mg (1,000 mg Oral Given 12/07/22 1821)  ketorolac (TORADOL) 15 MG/ML injection 15 mg (15 mg Intravenous Given 12/07/22 1822)  dexamethasone (DECADRON) injection 10 mg (10 mg Intravenous Given 12/07/22 1822)  prochlorperazine (COMPAZINE) injection 10 mg (10 mg Intravenous Given 12/07/22 1822)  diphenhydrAMINE (BENADRYL) injection 50 mg (50 mg Intravenous Given 12/07/22 1822)    Medical Decision Making:    [name] is a 57 y.o. female who presented to the ED today with headaches and elevated blood pressures at home.   Complete initial physical exam performed which was for the most part benign.    Reviewed and confirmed nursing documentation for past medical history, family history, social history.    Initial Assessment:    With the patient's presentation of headache, most likely diagnosis is headache not intractable. Other diagnoses were considered including (but not limited to) IC hypertension, atypical migraines, CVA/ICH and DVST. These are considered less likely due to history of present illness and physical exam findings.  Initial Plan:   Screening labs including CBC and Metabolic panel to evaluate for infectious or metabolic etiology of disease.  Medical management for pain as most of the Ddx are subacute and should be evaluated outpatient.  Objective evaluation as below reviewed with plan for close reassessment  Initial Study Results:   Laboratory  All laboratory results reviewed without evidence of clinically relevant pathology.     Radiology  All images reviewed independently. Agree with radiology report at this time.    DG Chest 2 View  Result Date: 12/07/2022 CLINICAL DATA:  Chest and jaw pain. EXAM: CHEST - 2 VIEW COMPARISON:  07/26/2022 FINDINGS: The cardiomediastinal silhouette is unremarkable. A small to moderate hiatal hernia is present. There is no evidence of focal airspace disease, pulmonary edema, suspicious pulmonary nodule/mass, pleural effusion, or pneumothorax. No acute bony abnormalities are identified. IMPRESSION: 1. No active cardiopulmonary disease. 2. Small to moderate hiatal hernia. Electronically Signed   By: Harmon Pier M.D.   On: 12/07/2022 16:32   MR BRAIN WO CONTRAST  Result Date: 11/27/2022 CLINICAL DATA:  Mental status change, unknown cause EXAM: MRI HEAD WITHOUT CONTRAST TECHNIQUE: Multiplanar, multiecho pulse sequences of the brain and surrounding structures were obtained without intravenous contrast.  COMPARISON:  CT head 11/27/22, MR head 01/02/17, CT maxillofacial 10/27/18 FINDINGS: Brain: Negative for an acute infarct. No hemorrhage. No hydrocephalus. No extra-axial fluid collection. Sequela of mild overall chronic microvascular ischemic change. Enlarged and partially empty sella. Prominent Meckel's cave bilaterally Vascular: Normal flow voids. There is narrowing of the sigmoid/transverse sinus junction, which can be seen in the setting idiopathic intracranial hypertension. Skull and upper cervical spine: Normal marrow signal. Sinuses/Orbits: No middle ear or mastoid. Paranasal sinuses are clear. Orbits are unremarkable. There is mild prominence of the bilateral optic nerve sheaths. Other: There a focal T2 hyperintense posterior midline nasopharynx measuring 1.5.4 cm (series 7, image 4). This is favored to represent cysts. Recommend correlation with direct IMPRESSION: 1. No acute intracranial process. 2. Enlarged and partially empty sella, prominent Meckel's cave bilaterally, and mild prominence of the  bilateral optic nerve sheaths. These findings are nonspecific but can be seen in the setting of idiopathic intracranial hypertension. 3. Focal T2 hyperintense posterior midline nasopharynx measuring 1.5.4 cm. 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 Recommend correlation with direct visualization. Electronically Signed   By: Lorenza Cambridge M.D.   On: 11/27/2022 19:17   DG Shoulder Left  Result Date: 11/27/2022 CLINICAL DATA:  Recurrent left shoulder pain status post rotator cuff surgery one-month ago EXAM: LEFT SHOULDER - 3 VIEW COMPARISON:  Left shoulder radiographs dated 08/02/2022 FINDINGS: There is no evidence of fracture or dislocation. There is no evidence of arthropathy or other focal bone abnormality. Soft tissues are unremarkable. IMPRESSION: No acute fracture or dislocation. Electronically Signed   By: Agustin Cree M.D.   On: 11/27/2022 16:45   CT HEAD WO CONTRAST  Result Date: 11/27/2022 CLINICAL DATA:  Headache EXAM: CT HEAD WITHOUT CONTRAST TECHNIQUE: Contiguous axial images were obtained from the base of the skull through the vertex without intravenous contrast. RADIATION DOSE REDUCTION: This exam was performed according to the departmental dose-optimization program which includes automated exposure control, adjustment of the mA and/or kV according to patient size and/or use of iterative reconstruction technique. COMPARISON:  01/01/2017 FINDINGS: Brain: No evidence of acute infarction, hemorrhage, hydrocephalus, extra-axial collection or mass lesion/mass effect. Vascular: No hyperdense vessel or unexpected calcification. Skull: Normal. Negative for fracture or focal lesion. Sinuses/Orbits: No acute finding. Other: None. IMPRESSION: No acute intracranial pathology. Electronically Signed   By: Jearld Lesch M.D.   On: 11/27/2022 14:18    Case discussed with Dr. Doran Durand.   Final Assessment and Plan:    #Acute non-intractable tension-type headache -Responsive to  pain regimen described above -Follow up with neurology outpatient - referral placed   Clinical Impression:  1. Acute non intractable tension-type headache      Discharge   Final Clinical Impression(s) / ED Diagnoses Final diagnoses:  Acute non intractable tension-type headache    Rx / DC Orders ED Discharge Orders     None          Hassan Rowan, [place], MD 12/07/22 2956    [name] Ade, MD 12/08/22 1457

## 2022-12-07 NOTE — Discharge Instructions (Addendum)
Please return to the ED should your visual disturbance worsens or you develop weakness, sensation changes, or difficulty speaking.

## 2022-12-07 NOTE — ED Triage Notes (Signed)
Patient here POV from Home.  Endorses Headache, Nausea, HTN (140/100, 160/108) that began earlier today. Mostly Constant.  No SOB. Some Jaw Pain. No Emesis.   NAD Noted during Triage. A&Ox4. GCS 15. Ambulatory.

## 2022-12-07 NOTE — Telephone Encounter (Signed)
Pharmacy Patient Advocate Encounter  Received notification from [facility] that Prior Authorization for Rasuvo has been  Faxed to Plan .  PA #/Case ID/Reference #: [id number]  Phone# [phone] Fax#[phone]

## 2022-12-13 ENCOUNTER — Ambulatory Visit
Admission: EM | Admit: 2022-12-13 | Discharge: 2022-12-13 | Disposition: A | Discharge status: Home / Self Care | Payer: BC Managed Care – PPO | Attending: Internal Medicine | Admitting: Internal Medicine

## 2022-12-13 DIAGNOSIS — J069 Acute upper respiratory infection, unspecified: Secondary | ICD-10-CM | POA: Diagnosis present

## 2022-12-13 DIAGNOSIS — U071 COVID-19: Secondary | ICD-10-CM | POA: Diagnosis not present

## 2022-12-13 MED ORDER — KETOROLAC TROMETHAMINE 60 MG/2ML IM SOLN
15.0000 mg | Freq: Once | INTRAMUSCULAR | Status: AC
  Administered 2022-12-13: 15 mg via INTRAMUSCULAR

## 2022-12-13 MED ORDER — FLUTICASONE PROPIONATE 50 MCG/ACT NA SUSP: 1.0000 | Freq: Every day | NASAL | 0 refills | Status: AC

## 2022-12-13 MED ORDER — BENZONATATE 100 MG PO CAPS: 100.0000 mg | ORAL_CAPSULE | Freq: Three times a day (TID) | ORAL | 0 refills | Status: AC | PRN

## 2022-12-13 NOTE — ED Provider Notes (Addendum)
[facility]    CSN: [id number] Arrival date & time: 12/13/22  1746      History   Chief Complaint Chief Complaint  Patient presents with   Cough    HPI [name] [name] is a 57 y.o. female.   Patient presents with 1 day history of cough, nasal congestion, runny nose, headache, postnasal drip.  Denies fever but does report that she has had bodyaches and chills.  Denies any known sick contacts.  Denies shortness of breath.  Denies history of asthma or COPD.  Patient has taken Tylenol for symptoms.   Cough   Past Medical History:  Diagnosis Date   Anxiety    Bursitis    CAD (coronary artery disease), native coronary artery    Coronary CTA showed minimal calcified plaque in the proximal and mid LAD less than 25%, mild left atrial enlargement and a coronary calcium score to 73 which is 99th percentile for age and sex matched controls by coronary CTA 10-2021   Depression    Diastolic dysfunction    Dyslipidemia 01/30/2015   Esophageal ring    Fibromyalgia    Hiatal hernia    HSV-1 infection    Morbid obesity (HCC)    Osteoarthritis    Rheumatoid arthritis(714.0)    M05.79   Rheumatoid arthritis, seropositive, 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HCC)    Treated with Orencia, TB neg 09/12/2015   Spondylolysis     Patient Active Problem List   Diagnosis Date Noted   Amenorrhea 06/09/2020   Anxiety 02/20/2018   Fatigue 02/20/2018   Low blood potassium 02/20/2018   Allergic rhinitis 12/14/2017   Fever blister 12/14/2017   Head congestion 12/14/2017   Hyperlipidemia 12/14/2017   Lateral epicondylitis of right elbow 05/04/2017   Sacral pain 03/23/2017   High risk medication use 08/03/2016   Fibromyalgia syndrome 08/03/2016   Abnormal cardiac CT angiography    Sacrococcygeal pain 02/20/2016   Rheumatoid arthritis involving 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HCC) 01/24/2016   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   Dyslipidemia 01/30/2015   MDD (major depressive disorder), recurrent severe,  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   Essential hypertension 08/25/2014   Morbid obesity (HCC) 08/25/2014   CAD (coronary artery disease), native coronary artery 08/24/2014   Numbness and tingling in left arm    Arm numbness left 08/22/2014   Chest pain 08/22/2014   Gastroesophageal reflux disease without esophagitis 07/23/2014   Coronary artery calcification seen on CAT scan 02/19/2014   Hip pain 10/23/2013   Solitary pulmonary nodule 08/28/2013   Chest pain, atypical 07/18/2013   Heart palpitations 06/27/2013   Cough 06/04/2013   Diastolic dysfunction 05/18/2013   SOB (shortness of breath) 05/09/2013   Other malaise and fatigue 01/21/2013   Stress and adjustment reaction 12/09/2012   Right sided abdominal pain 11/29/2012   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/21/2012   Morbid obesity with BMI of 50.0-59.9, adult (HCC) 11/21/2012   Arthritis 07/13/2012   Depression 07/13/2012   Routine health maintenance 05/07/2012   Migraine 06/07/1998    Past Surgical History:  Procedure Laterality Date   CARDIAC CATHETERIZATION N/A 06/11/2016   Procedure: Left Heart Cath and Coronary Angiography;  Surgeon: Corky Crafts, MD;  Location: [facility] INVASIVE CV LAB;  Service: Cardiovascular;  Laterality: N/A;   CESAREAN SECTION     COLONOSCOPY  07/27/2017   ESOPHAGEAL MANOMETRY N/A 10/28/2014   Procedure: ESOPHAGEAL MANOMETRY (EM);  Surgeon: Hilarie Fredrickson, MD;  Location: [facility] ENDOSCOPY;  Service: Endoscopy;  Laterality: N/A;  HERNIA REPAIR     reports surgery on 3 hernias, with 2 more present   LAPAROSCOPIC GASTRIC SLEEVE RESECTION  11/21/12   [facility]   LEFT HEART CATHETERIZATION WITH CORONARY ANGIOGRAM N/A 08/26/2014   Procedure: LEFT HEART CATHETERIZATION WITH CORONARY ANGIOGRAM;  Surgeon: Runell Gess, MD;  Location: [facility] CATH LAB;  Service: Cardiovascular;  Laterality: N/A;   TUBAL LIGATION      OB History   No obstetric history on file.      Home Medications    Prior to  Admission medications   Medication Sig Start Date End Date Taking? Authorizing Provider  benzonatate (TESSALON) 100 MG capsule Take 1 capsule (100 mg total) by mouth every 8 (eight) hours as needed for cough. 12/13/22  Yes , Rolly Salter E, FNP  fluticasone (FLONASE) 50 MCG/ACT nasal spray Place 1 spray into both nostrils daily. 12/13/22  Yes , Acie Fredrickson, FNP  Abatacept (ORENCIA IV) Inject 1,000 mg into the vein every 28 (twenty-eight) days.    Pollyann Savoy, MD  acetaminophen-codeine (TYLENOL #3) 300-30 MG tablet Take 1 tablet by mouth every 6 (six) hours as needed for moderate pain. Patient not taking: Reported on 08/03/2022 09/21/21   Kirtland Bouchard, PA-C  acyclovir ointment (ZOVIRAX) 5 % as needed. Patient not taking: Reported on 08/03/2022    [provider]  Acyclovir-Hydrocortisone (XERESE) 5-1 % CREA as needed.    [provider]  Adapalene 0.3 % gel Apply 1 application topically daily as needed (acne).  12/31/14   [provider]  albuterol (VENTOLIN HFA) 108 (90 Base) MCG/ACT inhaler Inhale into the lungs. 06/30/22   [provider]  Alcohol Swabs (ALCOHOL WIPES) 70 % PADS Alcohol Prep Pads    [provider]  ALPRAZolam (XANAX) 0.5 MG tablet TAKE 1 TABLET BY MOUTH NIGHTLY AS NEEDED FOR ANXIETY/SLEEP INDICATIONS ANXIOUS, INSOMNIA GRIEVING.    [provider]  ARIPiprazole (ABILIFY) 2 MG tablet Take 2 mg by mouth daily. 09/25/21   [provider]  aspirin EC 81 MG tablet Take 1 tablet (81 mg total) by mouth daily. Swallow whole. 10/13/21   Quintella Reichert, MD  atorvastatin (LIPITOR) 80 MG tablet TAKE 1 TABLET BY MOUTH EVERY DAY AT 6PM    [provider]  Azelastine HCl 137 MCG/SPRAY SOLN  08/07/19   [provider]  betamethasone, augmented, (DIPROLENE) 0.05 % lotion Apply topically as needed. 01/23/22   [provider]  buPROPion (WELLBUTRIN XL) 150 MG 24 hr tablet Take 450 mg by mouth every  morning. Patient not taking: Reported on 08/03/2022 05/23/19   [provider]  buPROPion (WELLBUTRIN XL) 150 MG 24 hr tablet Take 3 tablets by mouth every morning.    [provider]  cetirizine (ZYRTEC ALLERGY) 10 MG tablet Take 1 tablet (10 mg total) by mouth daily. 10/08/21   Wallis Bamberg, PA-C  Cetirizine HCl (ZYRTEC ALLERGY PO) 10 mg as needed.    [provider]  cetirizine-pseudoephedrine (ZYRTEC-D) 5-120 MG tablet Take 1 tablet by mouth 2 (two) times daily. Patient not taking: Reported on 01/27/2022 07/03/21   Bennie Pierini, FNP  ciclopirox (LOPROX) 0.77 % cream as needed.    [provider]  Ciclopirox 1 % shampoo Apply topically as needed. 01/25/22   [provider]  cyclobenzaprine (FLEXERIL) 10 MG tablet Take 1 tablet (10 mg total) by mouth at bedtime as needed for muscle spasms. 11/03/22   Gearldine Bienenstock, PA-C  diazepam (VALIUM) 5 MG tablet Take  1 tablet every day by oral route.    [provider]  diclofenac Sodium (VOLTAREN) 1 % GEL APPLY 2-4 GRAMS TO AFFECTED JOINT 4 TIMES DAILY AS NEEDED. 11/03/22   Gearldine Bienenstock, PA-C  diltiazem (CARDIZEM CD) 180 MG 24 hr capsule Take 1 capsule (180 mg total) by mouth daily. 12/15/21   Quintella Reichert, MD  doxepin (SINEQUAN) 25 MG capsule Take 25 mg by mouth at bedtime as needed. 04/28/19   [provider]  EPINEPHrine 0.3 mg/0.3 mL IJ SOAJ injection  07/02/19   [provider]  estradiol (ESTRACE) 0.5 MG tablet Take 0.5 mg by mouth daily. Patient not taking: Reported on 08/03/2022 09/16/21   [provider]  estradiol (ESTRACE) 1 MG tablet Take by mouth. 07/17/22   [provider]  ezetimibe (ZETIA) 10 MG tablet TAKE 1 TABLET BY MOUTH EVERY DAY 12/06/22   Quintella Reichert, MD  famciclovir (FAMVIR) 500 MG tablet Take 15,000 mg by mouth daily as needed (fever blister).  01/21/15   [provider]  famotidine (PEPCID) 20 MG tablet Take 20 mg by mouth as  needed. 09/22/21   [provider]  famotidine (PEPCID) 20 MG tablet daily. Patient not taking: Reported on 08/03/2022    [provider]  fluticasone (FLONASE) 50 MCG/ACT nasal spray Place 1 spray into both nostrils daily for 3 days. 11/11/21 11/14/21  Gustavus Bryant, FNP  Folic Acid-Vit B6-Vit B12 (FABB) 2.2-25-1 MG TABS Take 1 tablet by mouth daily. 08/03/22   Gearldine Bienenstock, PA-C  gabapentin (NEURONTIN) 300 MG capsule TAKE 1 CAPSULE BY MOUTH 3 TIMES A DAY AS NEEDED FOR PAIN 11/03/22   Gearldine Bienenstock, PA-C  hydrocortisone 2.5 % cream SMARTSIG:1 Topical Every Night 11/06/19   [provider]  hydrOXYzine (VISTARIL) 25 MG capsule Take 25 mg by mouth as needed. 10/04/21   [provider]  ketoconazole (NIZORAL) 2 % cream  03/14/20   [provider]  ketoconazole (NIZORAL) 2 % shampoo  06/03/20   [provider]  levothyroxine (SYNTHROID) 25 MCG tablet as needed. Patient not taking: Reported on 08/03/2022    [provider]  lidocaine (LIDODERM) 5 % Apply 1 patch topically as needed.    [provider]  lidocaine (LIDODERM) 5 % APPLY 1 PATCH TO AFFECTED AREA FOR 12 HOURS IN A 24 HOUR PERIOD 10/14/22   Gearldine Bienenstock, PA-C  LINZESS 145 MCG CAPS capsule Take 145 mcg by mouth daily. 01/26/22   [provider]  methocarbamol (ROBAXIN) 500 MG tablet Take 1 tablet 3 times a day by oral route. 09/24/22   [provider]  Methotrexate, PF, (RASUVO) 25 MG/0.5ML SOAJ INJECT ONE PEN SUBCUTANEOUSLY ONCE EVERY WEEK. STORE AT ROOM TEMPERATURE BETWEEN 68 - 77 DEGREES F. 08/03/22   Gearldine Bienenstock, PA-C  metoprolol tartrate (LOPRESSOR) 100 MG tablet Take 1 tablet (100 mg total) by mouth once for 1 dose. Take 1 tablet (100 mg total) two hours prior to CT scan. 10/13/21 10/13/21  Quintella Reichert, MD  neomycin-polymyxin-hydrocortisone (CORTISPORIN) 3.5-10000-1 OTIC suspension as needed.    [provider]  nitroGLYCERIN (NITROSTAT) 0.4 MG SL  tablet Place 1 tablet (0.4 mg total) under the tongue every 5 (five) minutes as needed for chest pain. 11/04/22   Quintella Reichert, MD  [facility] powder Apply topically. 03/17/20   [provider]  nystatin cream (MYCOSTATIN) Apply topically. 01/23/22   [provider]  Oxymetazoline HCl (NASAL SPRAY) 0.05 %  SOLN Nasal Decongestant (oxymetazoline) 0.05 % spray    [provider]  pantoprazole (PROTONIX) 40 MG tablet Take by mouth. 09/22/21   [provider]  predniSONE (DELTASONE) 20 MG tablet Take 2 tablets daily with breakfast. Patient not taking: Reported on 01/27/2022 10/08/21   Wallis Bamberg, PA-C  progesterone (PROMETRIUM) 100 MG capsule Take 100 mg by mouth daily. 09/16/21   [provider]  PROGESTERONE PO 100 mg daily. Patient not taking: Reported on 08/03/2022    [provider]  promethazine-dextromethorphan (PROMETHAZINE-DM) 6.25-15 MG/5ML syrup Take 5 mLs by mouth 4 (four) times daily as needed for cough. Patient not taking: Reported on 08/03/2022 07/13/22   Ward, Tylene Fantasia, PA-C  propranolol (INDERAL) 10 MG tablet Take 10 mg by mouth daily. Patient not taking: Reported on 08/03/2022    [provider]  propranolol (INDERAL) 20 MG tablet Take by mouth. 04/30/22   [provider]  pseudoephedrine (SUDAFED) 60 MG tablet Take 1 tablet (60 mg total) by mouth every 8 (eight) hours as needed for congestion. Patient not taking: Reported on 08/03/2022 10/08/21   Wallis Bamberg, PA-C  PULMICORT FLEXHALER 180 MCG/ACT inhaler Inhale into the lungs. 06/30/22   [provider]  rizatriptan (MAXALT) 10 MG tablet Take 1 tablet (10 mg total) by mouth as needed for migraine. May repeat in 2 hours if needed Patient not taking: Reported on 08/03/2022 04/15/21   Freddy Finner, NP  triamcinolone (KENALOG) 0.1 % paste Use as directed 1 application. in the mouth or throat 2 (two) times daily. Apply to mouth sores Patient not taking: Reported on  08/03/2022 11/11/21   Gustavus Bryant, FNP  Triamcinolone Acetonide 0.025 % LOTN as needed.    [provider]  valACYclovir (VALTREX) 500 MG tablet Take 500 mg by mouth as needed. 09/17/21   [provider]  Vitamin D, Ergocalciferol, (DRISDOL) 1.25 MG (50000 UT) CAPS capsule Take 50,000 Units by mouth See admin instructions. Take 1 tablet (50000 units) by mouth every Monday, Wednesday, Saturday    [provider]    Family History Family History  Problem Relation Age of Onset   Hyperlipidemia Mother    Depression Mother    Sarcoidosis Father    Lung disease Father        Pleural Mesothelioma   Cancer Father    Heart attack Paternal Grandmother    Hypertension Paternal Grandmother    Hypertension Maternal Grandmother    Diabetes Maternal Grandmother    Stroke Neg Hx    Colon cancer Neg Hx    Esophageal cancer Neg Hx    Rectal cancer Neg Hx    Stomach cancer Neg Hx     Social History Social History   Tobacco Use   Smoking status: Never    Passive exposure: Never   Smokeless tobacco: Never  Vaping Use   Vaping Use: Never used  Substance Use Topics   Alcohol use: No   Drug use: No     Allergies   Influenza vaccines, Influenza virus vaccine, Amitriptyline, and Isosorbide   Review of Systems Review of Systems Per HPI  Physical Exam Triage Vital Signs ED Triage Vitals  Enc Vitals Group     BP 12/13/22 1846 (!) 142/77     Pulse Rate 12/13/22 1846 85     Resp 12/13/22 1846 18     Temp 12/13/22 1846 98.6 F (37 C)     Temp Source 12/13/22 1846 Oral     SpO2 12/13/22  1846 99 %     Weight --      Height --      Head Circumference --      Peak Flow --      Pain Score 12/13/22 1847 7     Pain Loc --      Pain Edu? --      Excl. in GC? --    No data found.  Updated Vital Signs BP (!) 142/77 (BP Location: Left Arm)   Pulse 85   Temp 98.6 F (37 C) (Oral)   Resp 18   SpO2 99%   Visual Acuity Right Eye Distance:   Left Eye  Distance:   Bilateral Distance:    Right Eye Near:   Left Eye Near:    Bilateral Near:     Physical Exam Constitutional:      General: She is not in acute distress.    Appearance: Normal appearance. She is not toxic-appearing or diaphoretic.  HENT:     Head: Normocephalic and atraumatic.     Right Ear: Tympanic membrane and ear canal normal.     Left Ear: Tympanic membrane and ear canal normal.     Nose: Congestion present.     Mouth/Throat:     Mouth: Mucous membranes are moist.     Pharynx: No posterior oropharyngeal erythema.  Eyes:     Extraocular Movements: Extraocular movements intact.     Conjunctiva/sclera: Conjunctivae normal.     Pupils: Pupils are equal, round, and reactive to light.  Cardiovascular:     Rate and Rhythm: Normal rate and regular rhythm.     Pulses: Normal pulses.     Heart sounds: Normal heart sounds.  Pulmonary:     Effort: Pulmonary effort is normal. No respiratory distress.     Breath sounds: Normal breath sounds. No stridor. No wheezing, rhonchi or rales.  Abdominal:     General: Abdomen is flat. Bowel sounds are normal.     Palpations: Abdomen is soft.  Musculoskeletal:        General: Normal range of motion.     Cervical back: Normal range of motion.  Skin:    General: Skin is warm and dry.  Neurological:     General: No focal deficit present.     Mental Status: She is alert and oriented to person, place, and time. Mental status is at baseline.  Psychiatric:        Mood and Affect: Mood normal.        Behavior: Behavior normal.      UC Treatments / Results  Labs (all labs ordered are listed, but only abnormal results are displayed) Labs Reviewed  SARS CORONAVIRUS 2 (TAT 6-24 HRS)    EKG   Radiology No results found.  Procedures Procedures (including critical care time)  Medications Ordered in UC Medications  ketorolac (TORADOL) injection 15 mg (15 mg Intramuscular Given 12/13/22 1917)    Initial Impression /  Assessment and Plan / UC Course  I have reviewed the triage vital signs and the nursing notes.  Pertinent labs & imaging results that were available during my care of the patient were reviewed by me and considered in my medical decision making (see chart for details).     Patient presents with symptoms likely from a viral upper respiratory infection. Do not suspect underlying cardiopulmonary process. Symptoms seem unlikely related to ACS, CHF or COPD exacerbations, pneumonia, pneumothorax. Patient is nontoxic appearing and not in need of emergent medical  intervention. Covid test pending.   Recommended symptom control with medications and supportive care.  Flonase refilled for patient per request and patient sent benzonatate.  Patient requesting IM Toradol.  No obvious contraindications to Toradol but patient does take methotrexate so encouraged her to have levels checked with PCP given concurrent use with NSAIDs.  Advised no NSAIDs for at least 24 hours following injection as well.  Patient took IM Toradol a few days prior and tolerated well so I do think this is safe and reasonable.  Return if symptoms fail to improve in 1-2 weeks or you develop shortness of breath, chest pain, severe headache. Patient states understanding and is agreeable.  Discharged with PCP followup.  Final Clinical Impressions(s) / UC Diagnoses   Final diagnoses:  Viral upper respiratory tract infection with cough     Discharge Instructions      Suspect that you have a viral illness as we discussed.  COVID test pending.  You were given a shot today in urgent care for headache.  Do not take any ibuprofen, Advil, Aleve for least 24 hours following injection.    ED Prescriptions     Medication Sig Dispense Auth. Provider   benzonatate (TESSALON) 100 MG capsule Take 1 capsule (100 mg total) by mouth every 8 (eight) hours as needed for cough. 21 capsule [place], [place] [address], [place]   fluticasone [facility]) 50 MCG/ACT nasal  spray Place 1 spray into both nostrils daily. 16 g Gustavus Bryant, [place]      PDMP not reviewed this encounter.   Gustavus Bryant, [place] 12/13/22 1950    Gustavus Bryant, [place] 12/13/22 [phone]

## 2022-12-13 NOTE — Discharge Instructions (Addendum)
Suspect that you have a viral illness as we discussed.  COVID test pending.  You were given a shot today in urgent care for headache.  Do not take any ibuprofen, Advil, Aleve for least 24 hours following injection.

## 2022-12-13 NOTE — ED Triage Notes (Signed)
Pt c/o cough, congestion, runny nose, and post nasal drip since yesterday. States just took tylenol today.

## 2022-12-14 ENCOUNTER — Telehealth: Payer: BC Managed Care – PPO | Admitting: Physician Assistant

## 2022-12-14 DIAGNOSIS — U071 COVID-19: Secondary | ICD-10-CM | POA: Diagnosis not present

## 2022-12-14 DIAGNOSIS — R110 Nausea: Secondary | ICD-10-CM | POA: Diagnosis not present

## 2022-12-14 DIAGNOSIS — B001 Herpesviral vesicular dermatitis: Secondary | ICD-10-CM | POA: Diagnosis not present

## 2022-12-14 DIAGNOSIS — K1379 Other lesions of oral mucosa: Secondary | ICD-10-CM

## 2022-12-14 DIAGNOSIS — G43809 Other migraine, not intractable, without status migrainosus: Secondary | ICD-10-CM

## 2022-12-14 LAB — SARS CORONAVIRUS 2 (TAT 6-24 HRS): SARS Coronavirus 2: POSITIVE — AB

## 2022-12-14 MED ORDER — NIRMATRELVIR/RITONAVIR (PAXLOVID)TABLET
3.0000 | ORAL_TABLET | Freq: Two times a day (BID) | ORAL | 0 refills | Status: AC
Start: 2022-12-14 — End: 2022-12-19

## 2022-12-14 MED ORDER — PROMETHAZINE HCL 25 MG PO TABS
25.0000 mg | ORAL_TABLET | Freq: Three times a day (TID) | ORAL | 0 refills | Status: DC | PRN
Start: 2022-12-14 — End: 2023-04-14

## 2022-12-14 MED ORDER — ACYCLOVIR-HYDROCORTISONE 5-1 % EX CREA
TOPICAL_CREAM | CUTANEOUS | 0 refills | Status: AC
Start: 2022-12-14 — End: ?

## 2022-12-14 MED ORDER — TRIAMCINOLONE ACETONIDE 0.1 % MT PSTE
1.0000 | PASTE | Freq: Two times a day (BID) | OROMUCOSAL | 12 refills | Status: DC
Start: 2022-12-14 — End: 2023-04-14

## 2022-12-14 MED ORDER — ALBUTEROL SULFATE HFA 108 (90 BASE) MCG/ACT IN AERS
1.0000 | INHALATION_SPRAY | Freq: Four times a day (QID) | RESPIRATORY_TRACT | 0 refills | Status: DC | PRN
Start: 2022-12-14 — End: 2023-04-14

## 2022-12-14 MED ORDER — BUDESONIDE 90 MCG/ACT IN AEPB: 1.0000 | INHALATION_SPRAY | Freq: Two times a day (BID) | RESPIRATORY_TRACT | 0 refills | Status: DC

## 2022-12-14 MED ORDER — PROMETHAZINE-DM 6.25-15 MG/5ML PO SYRP
5.0000 mL | ORAL_SOLUTION | Freq: Four times a day (QID) | ORAL | 0 refills | Status: DC | PRN
Start: 2022-12-14 — End: 2023-04-14

## 2022-12-14 NOTE — Patient Instructions (Addendum)
[name] [name], thank you for joining Margaretann Loveless, PA-C for today's virtual visit.  While this provider is not your primary care provider (PCP), if your PCP is located in our provider database this encounter information will be shared with them immediately following your visit.   A [place] MyChart account gives you access to today's visit and all your visits, tests, and labs performed at [facility] " click here if you don't have a [facility] MyChart account or go to [email or web address]  Consent: (Patient) [name] [name] provided verbal consent for this virtual visit at the beginning of the encounter.  Current Medications:  Current Outpatient Medications:    Acyclovir-Hydrocortisone 5-1 % CREA, Apply topically to cold sores as needed, Disp: 5 g, Rfl: 0   albuterol (VENTOLIN HFA) 108 (90 Base) MCG/ACT inhaler, Inhale 1-2 puffs into the lungs every 6 (six) hours as needed., Disp: 8 g, Rfl: 0   Budesonide 90 MCG/ACT inhaler, Inhale 1-2 puffs into the lungs 2 (two) times daily., Disp: 1 each, Rfl: 0   nirmatrelvir/ritonavir (PAXLOVID) 20 x 150 MG & 10 x 100MG  TABS, Take 3 tablets by mouth 2 (two) times daily for 5 days. (Take nirmatrelvir 150 mg two tablets twice daily for 5 days and ritonavir 100 mg one tablet twice daily for 5 days) Patient GFR is greater than 60, Disp: 30 tablet, Rfl: 0   promethazine (PHENERGAN) 25 MG tablet, Take 1 tablet (25 mg total) by mouth every 8 (eight) hours as needed for nausea or vomiting., Disp: 20 tablet, Rfl: 0   promethazine-dextromethorphan (PROMETHAZINE-DM) 6.25-15 MG/5ML syrup, Take 5 mLs by mouth 4 (four) times daily as needed., Disp: 118 mL, Rfl: 0   triamcinolone (KENALOG) 0.1 % paste, Use as directed 1 Application in the mouth or throat 2 (two) times daily., Disp: 5 g, Rfl: 12   Abatacept (ORENCIA IV), Inject 1,000 mg into the vein every 28 (twenty-eight) days., Disp: , Rfl:    acetaminophen-codeine (TYLENOL #3)  300-30 MG tablet, Take 1 tablet by mouth every 6 (six) hours as needed for moderate pain. (Patient not taking: Reported on 08/03/2022), Disp: 10 tablet, Rfl: 0   acyclovir ointment (ZOVIRAX) 5 %, as needed. (Patient not taking: Reported on 08/03/2022), Disp: , Rfl:    Adapalene 0.3 % gel, Apply 1 application topically daily as needed (acne). , Disp: , Rfl:    Alcohol Swabs (ALCOHOL WIPES) 70 % PADS, Alcohol Prep Pads, Disp: , Rfl:    ALPRAZolam (XANAX) 0.5 MG tablet, TAKE 1 TABLET BY MOUTH NIGHTLY AS NEEDED FOR ANXIETY/SLEEP INDICATIONS ANXIOUS, INSOMNIA GRIEVING., Disp: , Rfl:    ARIPiprazole (ABILIFY) 2 MG tablet, Take 2 mg by mouth daily., Disp: , Rfl:    aspirin EC 81 MG tablet, Take 1 tablet (81 mg total) by mouth daily. Swallow whole., Disp: 90 tablet, Rfl: 3   atorvastatin (LIPITOR) 80 MG tablet, TAKE 1 TABLET BY MOUTH EVERY DAY AT 6PM, Disp: , Rfl:    Azelastine HCl 137 MCG/SPRAY SOLN, , Disp: , Rfl:    benzonatate (TESSALON) 100 MG capsule, Take 1 capsule (100 mg total) by mouth every 8 (eight) hours as needed for cough., Disp: 21 capsule, Rfl: 0   betamethasone, augmented, (DIPROLENE) 0.05 % lotion, Apply topically as needed., Disp: , Rfl:    buPROPion (WELLBUTRIN XL) 150 MG 24 hr tablet, Take 450 mg by mouth every morning. (Patient not taking: Reported on 08/03/2022), Disp: , Rfl:    buPROPion (WELLBUTRIN XL) 150 MG 24  hr tablet, Take 3 tablets by mouth every morning., Disp: , Rfl:    cetirizine (ZYRTEC ALLERGY) 10 MG tablet, Take 1 tablet (10 mg total) by mouth daily., Disp: 30 tablet, Rfl: 0   Cetirizine HCl (ZYRTEC ALLERGY PO), 10 mg as needed., Disp: , Rfl:    cetirizine-pseudoephedrine (ZYRTEC-D) 5-120 MG tablet, Take 1 tablet by mouth 2 (two) times daily. (Patient not taking: Reported on 01/27/2022), Disp: 30 tablet, Rfl: 0   ciclopirox (LOPROX) 0.77 % cream, as needed., Disp: , Rfl:    Ciclopirox 1 % shampoo, Apply topically as needed., Disp: , Rfl:    cyclobenzaprine (FLEXERIL) 10 MG  tablet, Take 1 tablet (10 mg total) by mouth at bedtime as needed for muscle spasms., Disp: 30 tablet, Rfl: 2   diazepam (VALIUM) 5 MG tablet, Take 1 tablet every day by oral route., Disp: , Rfl:    diclofenac Sodium (VOLTAREN) 1 % GEL, APPLY 2-4 GRAMS TO AFFECTED JOINT 4 TIMES DAILY AS NEEDED., Disp: 400 g, Rfl: 2   diltiazem (CARDIZEM CD) 180 MG 24 hr capsule, Take 1 capsule (180 mg total) by mouth daily., Disp: 90 capsule, Rfl: 3   doxepin (SINEQUAN) 25 MG capsule, Take 25 mg by mouth at bedtime as needed., Disp: , Rfl:    EPINEPHrine 0.3 mg/0.3 mL IJ SOAJ injection, , Disp: , Rfl:    estradiol (ESTRACE) 0.5 MG tablet, Take 0.5 mg by mouth daily. (Patient not taking: Reported on 08/03/2022), Disp: , Rfl:    estradiol (ESTRACE) 1 MG tablet, Take by mouth., Disp: , Rfl:    ezetimibe (ZETIA) 10 MG tablet, TAKE 1 TABLET BY MOUTH EVERY DAY, Disp: 30 tablet, Rfl: 0   famciclovir (FAMVIR) 500 MG tablet, Take 15,000 mg by mouth daily as needed (fever blister). , Disp: , Rfl: 11   famotidine (PEPCID) 20 MG tablet, Take 20 mg by mouth as needed., Disp: , Rfl:    famotidine (PEPCID) 20 MG tablet, daily. (Patient not taking: Reported on 08/03/2022), Disp: , Rfl:    fluticasone (FLONASE) 50 MCG/ACT nasal spray, Place 1 spray into both nostrils daily for 3 days., Disp: 16 g, Rfl: 0   fluticasone (FLONASE) 50 MCG/ACT nasal spray, Place 1 spray into both nostrils daily., Disp: 16 g, Rfl: 0   Folic Acid-Vit B6-Vit B12 (FABB) 2.2-25-1 MG TABS, Take 1 tablet by mouth daily., Disp: 90 tablet, Rfl: 2   gabapentin (NEURONTIN) 300 MG capsule, TAKE 1 CAPSULE BY MOUTH 3 TIMES A DAY AS NEEDED FOR PAIN, Disp: 90 capsule, Rfl: 2   hydrocortisone 2.5 % cream, SMARTSIG:1 Topical Every Night, Disp: , Rfl:    hydrOXYzine (VISTARIL) 25 MG capsule, Take 25 mg by mouth as needed., Disp: , Rfl:    ketoconazole (NIZORAL) 2 % cream, , Disp: , Rfl:    ketoconazole (NIZORAL) 2 % shampoo, , Disp: , Rfl:    levothyroxine (SYNTHROID) 25  MCG tablet, as needed. (Patient not taking: Reported on 08/03/2022), Disp: , Rfl:    lidocaine (LIDODERM) 5 %, Apply 1 patch topically as needed., Disp: , Rfl:    lidocaine (LIDODERM) 5 %, APPLY 1 PATCH TO AFFECTED AREA FOR 12 HOURS IN A 24 HOUR PERIOD, Disp: 30 patch, Rfl: 2   LINZESS 145 MCG CAPS capsule, Take 145 mcg by mouth daily., Disp: , Rfl:    methocarbamol (ROBAXIN) 500 MG tablet, Take 1 tablet 3 times a day by oral route., Disp: , Rfl:    Methotrexate, PF, (RASUVO) 25 MG/0.5ML SOAJ, INJECT  ONE PEN SUBCUTANEOUSLY ONCE EVERY WEEK. STORE AT ROOM TEMPERATURE BETWEEN 68 - 77 DEGREES F., Disp: 6 mL, Rfl: 0   metoprolol tartrate (LOPRESSOR) 100 MG tablet, Take 1 tablet (100 mg total) by mouth once for 1 dose. Take 1 tablet (100 mg total) two hours prior to CT scan., Disp: 1 tablet, Rfl: 0   neomycin-polymyxin-hydrocortisone (CORTISPORIN) 3.5-10000-1 OTIC suspension, as needed., Disp: , Rfl:    nitroGLYCERIN (NITROSTAT) 0.4 MG SL tablet, Place 1 tablet (0.4 mg total) under the tongue every 5 (five) minutes as needed for chest pain., Disp: 25 tablet, Rfl: 0   NYAMYC powder, Apply topically., Disp: , Rfl:    nystatin cream (MYCOSTATIN), Apply topically., Disp: , Rfl:    Oxymetazoline HCl (NASAL SPRAY) 0.05 % SOLN, Nasal Decongestant (oxymetazoline) 0.05 % spray, Disp: , Rfl:    pantoprazole (PROTONIX) 40 MG tablet, Take by mouth., Disp: , Rfl:    predniSONE (DELTASONE) 20 MG tablet, Take 2 tablets daily with breakfast. (Patient not taking: Reported on 01/27/2022), Disp: 10 tablet, Rfl: 0   progesterone (PROMETRIUM) 100 MG capsule, Take 100 mg by mouth daily., Disp: , Rfl:    PROGESTERONE PO, 100 mg daily. (Patient not taking: Reported on 08/03/2022), Disp: , Rfl:    propranolol (INDERAL) 10 MG tablet, Take 10 mg by mouth daily. (Patient not taking: Reported on 08/03/2022), Disp: , Rfl:    propranolol (INDERAL) 20 MG tablet, Take by mouth., Disp: , Rfl:    pseudoephedrine (SUDAFED) 60 MG tablet, Take 1  tablet (60 mg total) by mouth every 8 (eight) hours as needed for congestion. (Patient not taking: Reported on 08/03/2022), Disp: 30 tablet, Rfl: 0   rizatriptan (MAXALT) 10 MG tablet, Take 1 tablet (10 mg total) by mouth as needed for migraine. May repeat in 2 hours if needed (Patient not taking: Reported on 08/03/2022), Disp: 10 tablet, Rfl: 0   Triamcinolone Acetonide 0.025 % LOTN, as needed., Disp: , Rfl:    valACYclovir (VALTREX) 500 MG tablet, Take 500 mg by mouth as needed., Disp: , Rfl:    Vitamin D, Ergocalciferol, (DRISDOL) 1.25 MG (50000 UT) CAPS capsule, Take 50,000 Units by mouth See admin instructions. Take 1 tablet (50000 units) by mouth every Monday, Wednesday, Saturday, Disp: , Rfl:   Current Facility-Administered Medications:    ipratropium (ATROVENT) 0.03 % nasal spray 2 spray, 2 spray, Each Nare, TID, Ward, Tylene Fantasia, PA-C   Medications ordered in this encounter:  Meds ordered this encounter  Medications   nirmatrelvir/ritonavir (PAXLOVID) 20 x 150 MG & 10 x 100MG  TABS    Sig: Take 3 tablets by mouth 2 (two) times daily for 5 days. (Take nirmatrelvir 150 mg two tablets twice daily for 5 days and ritonavir 100 mg one tablet twice daily for 5 days) Patient GFR is greater than 60    Dispense:  30 tablet    Refill:  0    Order Specific Question:   Supervising Provider    Answer:   Merrilee Jansky [[id number]]   albuterol (VENTOLIN HFA) 108 (90 Base) MCG/ACT inhaler    Sig: Inhale 1-2 puffs into the lungs every 6 (six) hours as needed.    Dispense:  8 g    Refill:  0    Order Specific Question:   Supervising Provider    Answer:   Merrilee Jansky [[id number]]   Budesonide 90 MCG/ACT inhaler    Sig: Inhale 1-2 puffs into the lungs 2 (two) times daily.    Dispense:  1 each    Refill:  0    Order Specific Question:   Supervising Provider    Answer:   Merrilee Jansky [id number]   Acyclovir-Hydrocortisone 5-1 % CREA    Sig: Apply topically to cold sores as needed    Dispense:  5  g    Refill:  0    Order Specific Question:   Supervising Provider    Answer:   Merrilee Jansky [[id number]]   triamcinolone (KENALOG) 0.1 % paste    Sig: Use as directed 1 Application in the mouth or throat 2 (two) times daily.    Dispense:  5 g    Refill:  12    Order Specific Question:   Supervising Provider    Answer:   Merrilee Jansky [[id number]]   promethazine (PHENERGAN) 25 MG tablet    Sig: Take 1 tablet (25 mg total) by mouth every 8 (eight) hours as needed for nausea or vomiting.    Dispense:  20 tablet    Refill:  0    Order Specific Question:   Supervising Provider    Answer:   Merrilee Jansky [id number]   promethazine-dextromethorphan (PROMETHAZINE-DM) 6.25-15 MG/5ML syrup    Sig: Take 5 mLs by mouth 4 (four) times daily as needed.    Dispense:  118 mL    Refill:  0    Order Specific Question:   Supervising Provider    Answer:   Merrilee Jansky [[id number]]     *If you need refills on other medications prior to your next appointment, please contact your pharmacy*  Follow-Up: Call back or seek an in-person evaluation if the symptoms worsen or if the condition fails to improve as anticipated.  [facility] [phone]  Isolation Instructions: You are to isolate at home until you have been fever free for at least 24 hours without a fever-reducing medication, and symptoms have been steadily improving for 24 hours. At that time,  you can end isolation but need to mask for an additional 5 days.   If you must be around other household members who do not have symptoms, you need to make sure that both you and the family members are masking consistently with a high-quality mask.  If you note any worsening of symptoms despite treatment, please seek an in-person evaluation ASAP. If you note any significant shortness of breath or any chest pain, please seek ER evaluation. Please do not delay care!   COVID-19: What to Do if You Are Sick If you test positive and  are an older adult or someone who is at high risk of getting very sick from COVID-19, treatment may be available. Contact a healthcare provider right away after a positive test to determine if you are eligible, even if your symptoms are mild right now. You can also visit a Test to Treat location and, if eligible, receive a prescription from a provider. Don't delay: Treatment must be started within the first few days to be effective. If you have a fever, cough, or other symptoms, you might have COVID-19. Most people have mild illness and are able to recover at home. If you are sick: Keep track of your symptoms. If you have an emergency warning sign (including trouble breathing), call 911. Steps to help prevent the spread of COVID-19 if you are sick If you are sick with COVID-19 or think you might have COVID-19, follow the steps below to care for yourself and to help protect other  people in your home and community. Stay home except to get medical care Stay home. Most people with COVID-19 have mild illness and can recover at home without medical care. Do not leave your home, except to get medical care. Do not visit public areas and do not go to places where you are unable to wear a mask. Take care of yourself. Get rest and stay hydrated. Take over-the-counter medicines, such as acetaminophen, to help you feel better. Stay in touch with your doctor. Call before you get medical care. Be sure to get care if you have trouble breathing, or have any other emergency warning signs, or if you think it is an emergency. Avoid public transportation, ride-sharing, or taxis if possible. Get tested If you have symptoms of COVID-19, get tested. While waiting for test results, stay away from others, including staying apart from those living in your household. Get tested as soon as possible after your symptoms start. Treatments may be available for people with COVID-19 who are at risk for becoming very sick. Don't delay:  Treatment must be started early to be effective--some treatments must begin within 5 days of your first symptoms. Contact your healthcare provider right away if your test result is positive to determine if you are eligible. Self-tests are one of several options for testing for the virus that causes COVID-19 and may be more convenient than laboratory-based tests and point-of-care tests. Ask your healthcare provider or your local health department if you need help interpreting your test results. You can visit your state, tribal, local, and territorial health department's website to look for the latest local information on testing sites. Separate yourself from other people As much as possible, stay in a specific room and away from other people and pets in your home. If possible, you should use a separate bathroom. If you need to be around other people or animals in or outside of the home, wear a well-fitting mask. Tell your close contacts that they may have been exposed to COVID-19. An infected person can spread COVID-19 starting 48 hours (or 2 days) before the person has any symptoms or tests positive. By letting your close contacts know they may have been exposed to COVID-19, you are helping to protect everyone. See COVID-19 and Animals if you have questions about pets. If you are diagnosed with COVID-19, someone from the health department may call you. Answer the call to slow the spread. Monitor your symptoms Symptoms of COVID-19 include fever, cough, or other symptoms. Follow care instructions from your healthcare provider and local health department. Your local health authorities may give instructions on checking your symptoms and reporting information. When to seek emergency medical attention Look for emergency warning signs* for COVID-19. If someone is showing any of these signs, seek emergency medical care immediately: Trouble breathing Persistent pain or pressure in the chest New  confusion Inability to wake or stay awake Pale, gray, or blue-colored skin, lips, or nail beds, depending on skin tone *This list is not all possible symptoms. Please call your medical provider for any other symptoms that are severe or concerning to you. Call 911 or call ahead to your local emergency facility: Notify the operator that you are seeking care for someone who has or may have COVID-19. Call ahead before visiting your doctor Call ahead. Many medical visits for routine care are being postponed or done by phone or telemedicine. If you have a medical appointment that cannot be postponed, call your doctor's office, and tell them you  have or may have COVID-19. This will help the office protect themselves and other patients. If you are sick, wear a well-fitting mask You should wear a mask if you must be around other people or animals, including pets (even at home). Wear a mask with the best fit, protection, and comfort for you. You don't need to wear the mask if you are alone. If you can't put on a mask (because of trouble breathing, for example), cover your coughs and sneezes in some other way. Try to stay at least 6 feet away from other people. This will help protect the people around you. Masks should not be placed on young children under age 70 years, anyone who has trouble breathing, or anyone who is not able to remove the mask without help. Cover your coughs and sneezes Cover your mouth and nose with a tissue when you cough or sneeze. Throw away used tissues in a lined trash can. Immediately wash your hands with soap and water for at least 20 seconds. If soap and water are not available, clean your hands with an alcohol-based hand sanitizer that contains at least 60% alcohol. Clean your hands often Wash your hands often with soap and water for at least 20 seconds. This is especially important after blowing your nose, coughing, or sneezing; going to the bathroom; and before eating or  preparing food. Use hand sanitizer if soap and water are not available. Use an alcohol-based hand sanitizer with at least 60% alcohol, covering all surfaces of your hands and rubbing them together until they feel dry. Soap and water are the best option, especially if hands are visibly dirty. Avoid touching your eyes, nose, and mouth with unwashed hands. Handwashing Tips Avoid sharing personal household items Do not share dishes, drinking glasses, cups, eating utensils, towels, or bedding with other people in your home. Wash these items thoroughly after using them with soap and water or put in the dishwasher. Clean surfaces in your home regularly Clean and disinfect high-touch surfaces (for example, doorknobs, tables, handles, light switches, and countertops) in your "sick room" and bathroom. In shared spaces, you should clean and disinfect surfaces and items after each use by the person who is ill. If you are sick and cannot clean, a caregiver or other person should only clean and disinfect the area around you (such as your bedroom and bathroom) on an as needed basis. Your caregiver/other person should wait as long as possible (at least several hours) and wear a mask before entering, cleaning, and disinfecting shared spaces that you use. Clean and disinfect areas that may have blood, stool, or body fluids on them. Use household cleaners and disinfectants. Clean visible dirty surfaces with household cleaners containing soap or detergent. Then, use a household disinfectant. Use a product from [employer] List N: Disinfectants for Coronavirus (COVID-19). Be sure to follow the instructions on the label to ensure safe and effective use of the product. Many products recommend keeping the surface wet with a disinfectant for a certain period of time (look at "contact time" on the product label). You may also need to wear personal protective equipment, such as gloves, depending on the directions on the product  label. Immediately after disinfecting, wash your hands with soap and water for 20 seconds. For completed guidance on cleaning and disinfecting your home, visit Complete Disinfection Guidance. Take steps to improve ventilation at home Improve ventilation (air flow) at home to help prevent from spreading COVID-19 to other people in your household. Clear out  COVID-19 virus particles in the air by opening windows, using air filters, and turning on fans in your home. Use this interactive tool to learn how to improve air flow in your home. When you can be around others after being sick with COVID-19 Deciding when you can be around others is different for different situations. Find out when you can safely end home isolation. For any additional questions about your care, contact your healthcare provider or state or local health department. 08/26/2020 Content source: [facility] ([facility]), Division of Viral Diseases This information is not intended to replace advice given to you by your health care provider. Make sure you discuss any questions you have with your health care provider. Document Revised: 10/09/2020 Document Reviewed: 10/09/2020 Elsevier Patient Education  2022 [employer].     If you have been instructed to have an in-person evaluation today at a local Urgent Care facility, please use the link below. It will take you to a list of all of our available [facility], including address, phone number and hours of operation. Please do not delay care.  [facility]  If you or a family member do not have a primary care provider, use the link below to schedule a visit and establish care. When you choose a [facility] primary care physician or advanced practice provider, you gain a long-term partner in health. Find a Primary Care Provider  Learn more about [facility] in-office and virtual care options: [place] - Get  Care Now

## 2022-12-14 NOTE — Progress Notes (Signed)
Virtual Visit Consent   [name] [name], you are scheduled for a virtual visit with a [facility] provider today. Just as with appointments in the office, your consent must be obtained to participate. Your consent will be active for this visit and any virtual visit you may have with one of our providers in the next 365 days. If you have a MyChart account, a copy of this consent can be sent to you electronically.  As this is a virtual visit, video technology does not allow for your provider to perform a traditional examination. This may limit your provider's ability to fully assess your condition. If your provider identifies any concerns that need to be evaluated in person or the need to arrange testing (such as labs, EKG, etc.), we will make arrangements to do so. Although advances in technology are sophisticated, we cannot ensure that it will always work on either your end or our end. If the connection with a video visit is poor, the visit may have to be switched to a telephone visit. With either a video or telephone visit, we are not always able to ensure that we have a secure connection.  By engaging in this virtual visit, you consent to the provision of healthcare and authorize for your insurance to be billed (if applicable) for the services provided during this visit. Depending on your insurance coverage, you may receive a charge related to this service.  I need to obtain your verbal consent now. Are you willing to proceed with your visit today? [name] [name] has provided verbal consent on 12/14/2022 for a virtual visit (video or telephone). Margaretann Loveless, PA-C  Date: 12/14/2022 8:43 AM  Virtual Visit via Video Note   I, Margaretann Loveless, connected with  [name] [name]  ([id number], [birth year]) on 12/14/22 at  8:15 AM EDT by a video-enabled telemedicine application and verified that I am speaking with the correct person using two identifiers.  Location: Patient: Virtual  Visit Location Patient: Home Provider: Virtual Visit Location Provider: Home Office   I discussed the limitations of evaluation and management by telemedicine and the availability of in person appointments. The patient expressed understanding and agreed to proceed.    History of Present Illness: [name] [name] is a 57 y.o. who identifies as a female who was assigned female at birth, and is being seen today for Covid 63.  HPI: URI  This is a new problem. Episode onset: Went to UC yesterday and tested positive for Covid 19; Symptoms have been present for 2 days. The problem has been gradually worsening. The maximum temperature recorded prior to her arrival was 100.4 - 100.9 F. The fever has been present for Less than 1 day. Associated symptoms include congestion, coughing, headaches, rhinorrhea, sinus pain and a sore throat. Pertinent negatives include no diarrhea, ear pain, nausea, plugged ear sensation or vomiting. Associated symptoms comments: Mouth sores, cold sores. She has tried acetaminophen and increased fluids for the symptoms. The treatment provided no relief.     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   Dyslipidemia 01/30/2015   MDD (major depressive disorder), recurrent severe, 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   Essential hypertension 08/25/2014   Morbid obesity (HCC) 08/25/2014   CAD (coronary artery disease), native  coronary artery 08/24/2014   Numbness and tingling in left arm    Arm numbness left 08/22/2014   Chest pain 08/22/2014   Gastroesophageal reflux disease without esophagitis 07/23/2014   Coronary artery calcification seen on CAT scan 02/19/2014   Hip pain 10/23/2013   Solitary pulmonary nodule 08/28/2013   Chest pain, atypical 07/18/2013   Heart palpitations 06/27/2013   Cough 06/04/2013   Diastolic dysfunction 05/18/2013   SOB (shortness of breath) 05/09/2013   Other malaise and fatigue 01/21/2013   Stress and adjustment reaction 12/09/2012   Right sided abdominal pain 11/29/2012   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/21/2012   Morbid obesity with BMI of 50.0-59.9, adult (HCC) 11/21/2012   Arthritis 07/13/2012   Depression 07/13/2012   Routine health maintenance 05/07/2012   Migraine 06/07/1998    Allergies:  Allergies  Allergen Reactions   Influenza Vaccines Anaphylaxis   Influenza Virus Vaccine Anaphylaxis    Other reaction(s): Unknown   Amitriptyline    Isosorbide     Severe headaches   Medications:  Current Outpatient Medications:    Acyclovir-Hydrocortisone 5-1 % CREA, Apply topically to cold sores as needed, Disp: 5 g, Rfl: 0   albuterol (VENTOLIN HFA) 108 (90 Base) MCG/ACT inhaler, Inhale 1-2 puffs into the lungs every 6 (six) hours as needed., Disp: 8 g, Rfl: 0   Budesonide 90 MCG/ACT inhaler, Inhale 1-2 puffs into the lungs 2 (two) times daily., Disp: 1 each, Rfl: 0   nirmatrelvir/ritonavir (PAXLOVID) 20 x 150 MG & 10 x 100MG  TABS, Take 3 tablets by mouth 2 (two) times daily for 5 days. (Take nirmatrelvir 150 mg two tablets twice daily for 5 days and ritonavir 100 mg one tablet twice daily for 5 days) Patient GFR is greater than 60, Disp: 30 tablet, Rfl: 0   promethazine (PHENERGAN) 25 MG tablet, Take 1 tablet (25 mg total) by mouth every 8 (eight) hours as needed for nausea or vomiting., Disp: 20 tablet, Rfl: 0   promethazine-dextromethorphan  (PROMETHAZINE-DM) 6.25-15 MG/5ML syrup, Take 5 mLs by mouth 4 (four) times daily as needed., Disp: 118 mL, Rfl: 0   triamcinolone (KENALOG) 0.1 % paste, Use as directed 1 Application in the mouth or throat 2 (two) times daily., Disp: 5 g, Rfl: 12   Abatacept (ORENCIA IV), Inject 1,000 mg into the vein every 28 (twenty-eight) days., Disp: , Rfl:    acetaminophen-codeine (TYLENOL #3) 300-30 MG tablet, Take 1 tablet by mouth every 6 (six) hours as needed for moderate pain. (Patient not taking: Reported on 08/03/2022), Disp: 10 tablet, Rfl: 0   acyclovir ointment (ZOVIRAX) 5 %, as needed. (Patient not taking: Reported on 08/03/2022), Disp: , Rfl:    Adapalene 0.3 % gel, Apply 1 application topically daily as needed (acne). , Disp: , Rfl:    Alcohol Swabs (ALCOHOL WIPES) 70 % PADS, Alcohol Prep Pads, Disp: , Rfl:    ALPRAZolam (XANAX) 0.5 MG tablet, TAKE 1 TABLET BY MOUTH NIGHTLY AS NEEDED FOR ANXIETY/SLEEP INDICATIONS ANXIOUS, INSOMNIA GRIEVING., Disp: , Rfl:    ARIPiprazole (ABILIFY) 2 MG tablet, Take 2 mg by mouth daily., Disp: , Rfl:    aspirin EC 81 MG tablet, Take 1 tablet (81  mg total) by mouth daily. Swallow whole., Disp: 90 tablet, Rfl: 3   atorvastatin (LIPITOR) 80 MG tablet, TAKE 1 TABLET BY MOUTH EVERY DAY AT 6PM, Disp: , Rfl:    Azelastine HCl 137 MCG/SPRAY SOLN, , Disp: , Rfl:    benzonatate (TESSALON) 100 MG capsule, Take 1 capsule (100 mg total) by mouth every 8 (eight) hours as needed for cough., Disp: 21 capsule, Rfl: 0   betamethasone, augmented, (DIPROLENE) 0.05 % lotion, Apply topically as needed., Disp: , Rfl:    buPROPion (WELLBUTRIN XL) 150 MG 24 hr tablet, Take 450 mg by mouth every morning. (Patient not taking: Reported on 08/03/2022), Disp: , Rfl:    buPROPion (WELLBUTRIN XL) 150 MG 24 hr tablet, Take 3 tablets by mouth every morning., Disp: , Rfl:    cetirizine (ZYRTEC ALLERGY) 10 MG tablet, Take 1 tablet (10 mg total) by mouth daily., Disp: 30 tablet, Rfl: 0   Cetirizine HCl  (ZYRTEC ALLERGY PO), 10 mg as needed., Disp: , Rfl:    cetirizine-pseudoephedrine (ZYRTEC-D) 5-120 MG tablet, Take 1 tablet by mouth 2 (two) times daily. (Patient not taking: Reported on 01/27/2022), Disp: 30 tablet, Rfl: 0   ciclopirox (LOPROX) 0.77 % cream, as needed., Disp: , Rfl:    Ciclopirox 1 % shampoo, Apply topically as needed., Disp: , Rfl:    cyclobenzaprine (FLEXERIL) 10 MG tablet, Take 1 tablet (10 mg total) by mouth at bedtime as needed for muscle spasms., Disp: 30 tablet, Rfl: 2   diazepam (VALIUM) 5 MG tablet, Take 1 tablet every day by oral route., Disp: , Rfl:    diclofenac Sodium (VOLTAREN) 1 % GEL, APPLY 2-4 GRAMS TO AFFECTED JOINT 4 TIMES DAILY AS NEEDED., Disp: 400 g, Rfl: 2   diltiazem (CARDIZEM CD) 180 MG 24 hr capsule, Take 1 capsule (180 mg total) by mouth daily., Disp: 90 capsule, Rfl: 3   doxepin (SINEQUAN) 25 MG capsule, Take 25 mg by mouth at bedtime as needed., Disp: , Rfl:    EPINEPHrine 0.3 mg/0.3 mL IJ SOAJ injection, , Disp: , Rfl:    estradiol (ESTRACE) 0.5 MG tablet, Take 0.5 mg by mouth daily. (Patient not taking: Reported on 08/03/2022), Disp: , Rfl:    estradiol (ESTRACE) 1 MG tablet, Take by mouth., Disp: , Rfl:    ezetimibe (ZETIA) 10 MG tablet, TAKE 1 TABLET BY MOUTH EVERY DAY, Disp: 30 tablet, Rfl: 0   famciclovir (FAMVIR) 500 MG tablet, Take 15,000 mg by mouth daily as needed (fever blister). , Disp: , Rfl: 11   famotidine (PEPCID) 20 MG tablet, Take 20 mg by mouth as needed., Disp: , Rfl:    famotidine (PEPCID) 20 MG tablet, daily. (Patient not taking: Reported on 08/03/2022), Disp: , Rfl:    fluticasone (FLONASE) 50 MCG/ACT nasal spray, Place 1 spray into both nostrils daily for 3 days., Disp: 16 g, Rfl: 0   fluticasone (FLONASE) 50 MCG/ACT nasal spray, Place 1 spray into both nostrils daily., Disp: 16 g, Rfl: 0   Folic Acid-Vit B6-Vit B12 (FABB) 2.2-25-1 MG TABS, Take 1 tablet by mouth daily., Disp: 90 tablet, Rfl: 2   gabapentin (NEURONTIN) 300 MG  capsule, TAKE 1 CAPSULE BY MOUTH 3 TIMES A DAY AS NEEDED FOR PAIN, Disp: 90 capsule, Rfl: 2   hydrocortisone 2.5 % cream, SMARTSIG:1 Topical Every Night, Disp: , Rfl:    hydrOXYzine (VISTARIL) 25 MG capsule, Take 25 mg by mouth as needed., Disp: , Rfl:    ketoconazole (NIZORAL) 2 % cream, ,  Disp: , Rfl:    ketoconazole (NIZORAL) 2 % shampoo, , Disp: , Rfl:    levothyroxine (SYNTHROID) 25 MCG tablet, as needed. (Patient not taking: Reported on 08/03/2022), Disp: , Rfl:    lidocaine (LIDODERM) 5 %, Apply 1 patch topically as needed., Disp: , Rfl:    lidocaine (LIDODERM) 5 %, APPLY 1 PATCH TO AFFECTED AREA FOR 12 HOURS IN A 24 HOUR PERIOD, Disp: 30 patch, Rfl: 2   LINZESS 145 MCG CAPS capsule, Take 145 mcg by mouth daily., Disp: , Rfl:    methocarbamol (ROBAXIN) 500 MG tablet, Take 1 tablet 3 times a day by oral route., Disp: , Rfl:    Methotrexate, PF, (RASUVO) 25 MG/0.5ML SOAJ, INJECT ONE PEN SUBCUTANEOUSLY ONCE EVERY WEEK. STORE AT ROOM TEMPERATURE BETWEEN 68 - 77 DEGREES F., Disp: 6 mL, Rfl: 0   metoprolol tartrate (LOPRESSOR) 100 MG tablet, Take 1 tablet (100 mg total) by mouth once for 1 dose. Take 1 tablet (100 mg total) two hours prior to CT scan., Disp: 1 tablet, Rfl: 0   neomycin-polymyxin-hydrocortisone (CORTISPORIN) 3.5-10000-1 OTIC suspension, as needed., Disp: , Rfl:    nitroGLYCERIN (NITROSTAT) 0.4 MG SL tablet, Place 1 tablet (0.4 mg total) under the tongue every 5 (five) minutes as needed for chest pain., Disp: 25 tablet, Rfl: 0   NYAMYC powder, Apply topically., Disp: , Rfl:    nystatin cream (MYCOSTATIN), Apply topically., Disp: , Rfl:    Oxymetazoline HCl (NASAL SPRAY) 0.05 % SOLN, Nasal Decongestant (oxymetazoline) 0.05 % spray, Disp: , Rfl:    pantoprazole (PROTONIX) 40 MG tablet, Take by mouth., Disp: , Rfl:    predniSONE (DELTASONE) 20 MG tablet, Take 2 tablets daily with breakfast. (Patient not taking: Reported on 01/27/2022), Disp: 10 tablet, Rfl: 0   progesterone (PROMETRIUM)  100 MG capsule, Take 100 mg by mouth daily., Disp: , Rfl:    PROGESTERONE PO, 100 mg daily. (Patient not taking: Reported on 08/03/2022), Disp: , Rfl:    propranolol (INDERAL) 10 MG tablet, Take 10 mg by mouth daily. (Patient not taking: Reported on 08/03/2022), Disp: , Rfl:    propranolol (INDERAL) 20 MG tablet, Take by mouth., Disp: , Rfl:    pseudoephedrine (SUDAFED) 60 MG tablet, Take 1 tablet (60 mg total) by mouth every 8 (eight) hours as needed for congestion. (Patient not taking: Reported on 08/03/2022), Disp: 30 tablet, Rfl: 0   rizatriptan (MAXALT) 10 MG tablet, Take 1 tablet (10 mg total) by mouth as needed for migraine. May repeat in 2 hours if needed (Patient not taking: Reported on 08/03/2022), Disp: 10 tablet, Rfl: 0   Triamcinolone Acetonide 0.025 % LOTN, as needed., Disp: , Rfl:    valACYclovir (VALTREX) 500 MG tablet, Take 500 mg by mouth as needed., Disp: , Rfl:    Vitamin D, Ergocalciferol, (DRISDOL) 1.25 MG (50000 UT) CAPS capsule, Take 50,000 Units by mouth See admin instructions. Take 1 tablet (50000 units) by mouth every Monday, Wednesday, Saturday, Disp: , Rfl:   Current Facility-Administered Medications:    ipratropium (ATROVENT) 0.03 % nasal spray 2 spray, 2 spray, Each Nare, TID, Ward, Tylene Fantasia, PA-C  Observations/Objective: Patient is well-developed, well-nourished in no acute distress.  Resting comfortably at home.  Head is normocephalic, atraumatic.  No labored breathing.  Speech is clear and coherent with logical content.  Patient is alert and oriented at baseline.    Assessment and Plan: 1. COVID-19 - MyChart COVID-19 home monitoring program; Future - nirmatrelvir/ritonavir (PAXLOVID) 20 x 150 MG &  10 x 100MG  TABS; Take 3 tablets by mouth 2 (two) times daily for 5 days. (Take nirmatrelvir 150 mg two tablets twice daily for 5 days and ritonavir 100 mg one tablet twice daily for 5 days) Patient GFR is greater than 60  Dispense: 30 tablet; Refill: 0 - albuterol  (VENTOLIN HFA) 108 (90 Base) MCG/ACT inhaler; Inhale 1-2 puffs into the lungs every 6 (six) hours as needed.  Dispense: 8 g; Refill: 0 - Budesonide 90 MCG/ACT inhaler; Inhale 1-2 puffs into the lungs 2 (two) times daily.  Dispense: 1 each; Refill: 0 - promethazine (PHENERGAN) 25 MG tablet; Take 1 tablet (25 mg total) by mouth every 8 (eight) hours as needed for nausea or vomiting.  Dispense: 20 tablet; Refill: 0 - promethazine-dextromethorphan (PROMETHAZINE-DM) 6.25-15 MG/5ML syrup; Take 5 mLs by mouth 4 (four) times daily as needed.  Dispense: 118 mL; Refill: 0  2. Cold sore - Acyclovir-Hydrocortisone 5-1 % CREA; Apply topically to cold sores as needed  Dispense: 5 g; Refill: 0  3. Mouth sore - triamcinolone (KENALOG) 0.1 % paste; Use as directed 1 Application in the mouth or throat 2 (two) times daily.  Dispense: 5 g; Refill: 12  4. Nausea - promethazine (PHENERGAN) 25 MG tablet; Take 1 tablet (25 mg total) by mouth every 8 (eight) hours as needed for nausea or vomiting.  Dispense: 20 tablet; Refill: 0  5. Other migraine without status migrainosus, not intractable - promethazine (PHENERGAN) 25 MG tablet; Take 1 tablet (25 mg total) by mouth every 8 (eight) hours as needed for nausea or vomiting.  Dispense: 20 tablet; Refill: 0  - Continue OTC symptomatic management of choice - Will send OTC vitamins and supplement information through [facility] - Paxlovid prescribed - Promethazine DM for cough, has Tessalon perles already [employer] for cold sores, has oral Valtrex already - Triamcinolone dental paste for mouth sores - Albuterol and Pulmicort for reactive airway - Promethazine tablets for nausea and migraine refilled at patient request; advised to not take at same time as cough syrup and space apart; voiced understanding - Patient enrolled in MyChart symptom monitoring - Push fluids - Rest as needed - Discussed return precautions and when to seek in-person evaluation, sent via [facility] as  well   Follow Up Instructions: I discussed the assessment and treatment plan with the patient. The patient was provided an opportunity to ask questions and all were answered. The patient agreed with the plan and demonstrated an understanding of the instructions.  A copy of instructions were sent to the patient via MyChart unless otherwise noted below.    The patient was advised to call back or seek an in-person evaluation if the symptoms worsen or if the condition fails to improve as anticipated.  Time:  I spent 13 minutes with the patient via telehealth technology discussing the above problems/concerns.    Margaretann Loveless, PA-C

## 2022-12-14 NOTE — Telephone Encounter (Signed)
Received notification from [facility] regarding a prior authorization for RASUVO. Authorization has been APPROVED from 12/08/22 to 12/08/23. Approval letter sent to scan center.  Patient must continue to fill through [facility]: [phone]  Authorization # [id number]  Chesley Mires, PharmD, MPH, BCPS, CPP Clinical Pharmacist (Rheumatology and Pulmonology)

## 2022-12-16 ENCOUNTER — Other Ambulatory Visit: Payer: Self-pay | Admitting: Physician Assistant

## 2022-12-16 ENCOUNTER — Other Ambulatory Visit: Payer: Self-pay | Admitting: *Deleted

## 2022-12-16 MED ORDER — FABB 2.2-25-1 MG PO TABS: 1.0000 | ORAL_TABLET | Freq: Every day | ORAL | 2 refills | Status: DC

## 2022-12-16 NOTE — Telephone Encounter (Signed)
Last Fill: 08/03/2022  Next Visit: 01/11/2023  Last Visit: 08/03/2022  Dx: Rheumatoid arthritis involving multiple sites with positive rheumatoid factor   Current Dose per office note on 08/03/2022: FABB daily   Okay to refill FABB Tab?

## 2022-12-20 ENCOUNTER — Inpatient Hospital Stay (HOSPITAL_COMMUNITY): Admission: RE | Admit: 2022-12-20 | Payer: BC Managed Care – PPO | Source: Ambulatory Visit

## 2022-12-26 ENCOUNTER — Other Ambulatory Visit: Payer: Self-pay | Admitting: Cardiology

## 2022-12-29 ENCOUNTER — Telehealth: Payer: Self-pay | Admitting: *Deleted

## 2022-12-29 NOTE — Telephone Encounter (Signed)
Patient is requesting a return call from [facility]. Patient states Devki helped her with the [employer] co-pay assistance. Patient is wondering if it can be back dated to January 2024. Patient states she can be reached before 10:30 am tomorrow or after 12:30 pm.

## 2022-12-29 NOTE — Telephone Encounter (Signed)
We received a fax from [facility] for multiple medications with abuse potential. Document was reviewed by Ladona Ridgel. Ladona Ridgel advised to clarify if patient has a pain management specialist. If so, advise patient to follow up with them to discuss them taking over her prescriptions: gabapentin, Robaxin, Flexeril and Meloxicam. There are too many providers prescribing her current med regimen. Getting muscle relaxer from [place] and a different provider.   Called patient and she states she had a torn rotator cuff and had to have surgery. Patient was seeing orthopedics and a shoulder specialist. Patient states she being seen by several providers at [facility] and they are the ones who have been prescribing the medication. Patient states she is no longer taking the Flexeril.   Patient also states she was due to have ehr Infusion last Monday but was unable to as she was diagnosed with Covid. Patient states she was prescribed Paxlovid and advised to wait 10 days before rescheduling her infusion. Patient states she has had a follow up with her PCP and now has bronchitis and sinusitis. Patient was given a Z-pak and prednisone. Patient states she would like to know when to reschedule her infusion. Patient advised she may reschedule her infusion. Patient advised she may reschedule once she has completed her antibiotics and all her symptoms have resolved.

## 2022-12-29 NOTE — Progress Notes (Deleted)
Office Visit Note  Patient: [name] [name]             Date of Birth: [birth year]           MRN: [id number]             PCP: [name], MD Referring: No ref. provider found Visit Date: 01/11/2023 Occupation: @GUAROCC @  Subjective:  No chief complaint on file.   History of Present Illness: [name] [name] is a 57 y.o. female ***     Activities of Daily Living:  Patient reports morning stiffness for *** {minute/hour:19697}.   Patient {ACTIONS;DENIES/REPORTS:21021675::"Denies"} nocturnal pain.  Difficulty dressing/grooming: {ACTIONS;DENIES/REPORTS:21021675::"Denies"} Difficulty climbing stairs: {ACTIONS;DENIES/REPORTS:21021675::"Denies"} Difficulty getting out of chair: {ACTIONS;DENIES/REPORTS:21021675::"Denies"} Difficulty using hands for taps, buttons, cutlery, and/or writing: {ACTIONS;DENIES/REPORTS:21021675::"Denies"}  No Rheumatology ROS completed.   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   Dyslipidemia 01/30/2015   MDD (major depressive disorder), recurrent severe, 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   Essential hypertension 08/25/2014   Morbid obesity (HCC) 08/25/2014   CAD (coronary artery disease), native coronary artery 08/24/2014   Numbness and tingling in left arm    Arm numbness left 08/22/2014   Chest pain 08/22/2014   Gastroesophageal reflux disease without esophagitis 07/23/2014    Coronary artery calcification seen on CAT scan 02/19/2014   Hip pain 10/23/2013   Solitary pulmonary nodule 08/28/2013   Chest pain, atypical 07/18/2013   Heart palpitations 06/27/2013   Cough 06/04/2013   Diastolic dysfunction 05/18/2013   SOB (shortness of breath) 05/09/2013   Other malaise and fatigue 01/21/2013   Stress and adjustment reaction 12/09/2012   Right sided abdominal pain 11/29/2012   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/21/2012   Morbid obesity with BMI of 50.0-59.9, adult (HCC) 11/21/2012   Arthritis 07/13/2012   Depression 07/13/2012   Routine health maintenance 05/07/2012   Migraine 06/07/1998    Past Medical History:  Diagnosis Date   Anxiety    Bursitis    CAD (coronary artery disease), native coronary artery    Coronary CTA showed minimal calcified plaque in the proximal and mid LAD less than 25%, mild left atrial enlargement and a coronary calcium score to 73 which is 99th percentile for age and sex matched controls by coronary CTA 10-2021   Depression    Diastolic dysfunction    Dyslipidemia 01/30/2015   Esophageal ring    Fibromyalgia    Hiatal hernia    HSV-1 infection    Morbid obesity (HCC)    Osteoarthritis    Rheumatoid arthritis(714.0)    M05.79   Rheumatoid arthritis, seropositive, 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HCC)    Treated with Orencia, TB neg 09/12/2015   Spondylolysis     Family History  Problem Relation Age of Onset   Hyperlipidemia Mother    Depression Mother    Sarcoidosis Father    Lung disease Father        Pleural Mesothelioma   Cancer Father  Heart attack Paternal Grandmother    Hypertension Paternal Grandmother    Hypertension Maternal Grandmother    Diabetes Maternal Grandmother    Stroke Neg Hx    Colon cancer Neg Hx    Esophageal cancer Neg Hx    Rectal cancer Neg Hx    Stomach cancer Neg Hx    Past Surgical History:  Procedure Laterality Date   CARDIAC CATHETERIZATION N/A 06/11/2016   Procedure: Left Heart Cath  and Coronary Angiography;  Surgeon: Corky Crafts, MD;  Location: [facility] INVASIVE CV LAB;  Service: Cardiovascular;  Laterality: N/A;   CESAREAN SECTION     COLONOSCOPY  07/27/2017   ESOPHAGEAL MANOMETRY N/A 10/28/2014   Procedure: ESOPHAGEAL MANOMETRY (EM);  Surgeon: Hilarie Fredrickson, MD;  Location: [facility] ENDOSCOPY;  Service: Endoscopy;  Laterality: N/A;   HERNIA REPAIR     reports surgery on 3 hernias, with 2 more present   LAPAROSCOPIC GASTRIC SLEEVE RESECTION  11/21/12   [facility]   LEFT HEART CATHETERIZATION WITH CORONARY ANGIOGRAM N/A 08/26/2014   Procedure: LEFT HEART CATHETERIZATION WITH CORONARY ANGIOGRAM;  Surgeon: Runell Gess, MD;  Location: [facility] CATH LAB;  Service: Cardiovascular;  Laterality: N/A;   TUBAL LIGATION     Social History   Social History Narrative   Marital Status: Married Probation officer)    Children:  Son [name]) Daughter [name])    Pets: None    Living Situation: Lives with husband and children    Occupation: Occupational psychologist Administrator)     Education: Oncologist (Psychology)     Tobacco Use/Exposure:  None    Alcohol Use:  Occasional   Drug Use:  None   Diet:  Regular   Exercise: Walking or Treadmill (2 x week)   Hobbies: Clinical cytogeneticist, Christmas Decorations.               Immunization History  Administered Date(s) Administered   PFIZER(Purple Top)SARS-COV-2 Vaccination 08/30/2019, 09/20/2019, 06/05/2020   Tdap 09/21/2016     Objective: Vital Signs: There were no vitals taken for this visit.   Physical Exam   Musculoskeletal Exam: ***  CDAI Exam: CDAI Score: -- Patient Global: --; Provider Global: -- Swollen: --; Tender: -- Joint Exam 01/11/2023   No joint exam has been documented for this visit   There is currently no information documented on the homunculus. Go to the Rheumatology activity and complete the homunculus joint exam.  Investigation: No additional findings.  Imaging: DG Chest 2 View  Result Date:  12/07/2022 CLINICAL DATA:  Chest and jaw pain. EXAM: CHEST - 2 VIEW COMPARISON:  07/26/2022 FINDINGS: The cardiomediastinal silhouette is unremarkable. A small to moderate hiatal hernia is present. There is no evidence of focal airspace disease, pulmonary edema, suspicious pulmonary nodule/mass, pleural effusion, or pneumothorax. No acute bony abnormalities are identified. IMPRESSION: 1. No active cardiopulmonary disease. 2. Small to moderate hiatal hernia. Electronically Signed   By: Harmon Pier M.D.   On: 12/07/2022 16:32    Recent Labs: Lab Results  Component Value Date   WBC 10.3 12/07/2022   HGB 14.7 12/07/2022   PLT 268 12/07/2022   NA 144 12/07/2022   K 3.5 12/07/2022   CL 108 12/07/2022   CO2 27 12/07/2022   GLUCOSE 121 (H) 12/07/2022   BUN 14 12/07/2022   CREATININE 0.84 12/07/2022   BILITOT 0.3 11/27/2022   ALKPHOS 65 11/27/2022   AST 24 11/27/2022   ALT 17 11/27/2022   PROT 6.2 (L) 11/27/2022   ALBUMIN 2.9 (  L) 11/27/2022   CALCIUM 9.5 12/07/2022   GFRAA >60 12/26/2019   QFTBGOLD Negative 09/01/2016   QFTBGOLDPLUS Negative 09/20/2022    Speciality Comments: ORENCIA 1000 mg x 4 weeks  Procedures:  No procedures performed Allergies: Influenza vaccines, Influenza virus vaccine, Amitriptyline, and Isosorbide   Assessment / Plan:     Visit Diagnoses: No diagnosis found.  Orders: No orders of the defined types were placed in this encounter.  No orders of the defined types were placed in this encounter.   Face-to-face time spent with patient was *** minutes. Greater than 50% of time was spent in counseling and coordination of care.  Follow-Up Instructions: No follow-ups on file.   Ellen Henri, CMA  Note - This record has been created using [employer].  Chart creation errors have been sought, but may not always  have been located. Such creation errors do not reflect on  the standard of medical care.

## 2023-01-02 ENCOUNTER — Other Ambulatory Visit: Payer: Self-pay | Admitting: Cardiology

## 2023-01-06 ENCOUNTER — Encounter: Payer: Self-pay | Admitting: *Deleted

## 2023-01-06 NOTE — Telephone Encounter (Signed)
Returned call to patient. Recommend she may write letter to [facility] requesting some financial help or otherwise request assistance with bill. She'd like to call copay card company for resolution first if possible. I did discuss that copay card company usually picks up copay once the insurance picks up their part of cost  Chesley Mires, PharmD, MPH, BCPS, CPP Clinical Pharmacist (Rheumatology and Pulmonology)

## 2023-01-11 ENCOUNTER — Ambulatory Visit: Payer: BC Managed Care – PPO | Admitting: Rheumatology

## 2023-01-11 DIAGNOSIS — M170 Bilateral primary osteoarthritis of knee: Secondary | ICD-10-CM

## 2023-01-11 DIAGNOSIS — E785 Hyperlipidemia, unspecified: Secondary | ICD-10-CM

## 2023-01-11 DIAGNOSIS — M25512 Pain in left shoulder: Secondary | ICD-10-CM

## 2023-01-11 DIAGNOSIS — M7061 Trochanteric bursitis, right hip: Secondary | ICD-10-CM

## 2023-01-11 DIAGNOSIS — Z9884 Bariatric surgery status: Secondary | ICD-10-CM

## 2023-01-11 DIAGNOSIS — M797 Fibromyalgia: Secondary | ICD-10-CM

## 2023-01-11 DIAGNOSIS — Z79899 Other long term (current) drug therapy: Secondary | ICD-10-CM

## 2023-01-11 DIAGNOSIS — R911 Solitary pulmonary nodule: Secondary | ICD-10-CM

## 2023-01-11 DIAGNOSIS — Z8639 Personal history of other endocrine, nutritional and metabolic disease: Secondary | ICD-10-CM

## 2023-01-11 DIAGNOSIS — G4709 Other insomnia: Secondary | ICD-10-CM

## 2023-01-11 DIAGNOSIS — Z8679 Personal history of other diseases of the circulatory system: Secondary | ICD-10-CM

## 2023-01-11 DIAGNOSIS — Z8659 Personal history of other mental and behavioral disorders: Secondary | ICD-10-CM

## 2023-01-11 DIAGNOSIS — M0579 Rheumatoid arthritis with rheumatoid factor of multiple sites without organ or systems involvement: Secondary | ICD-10-CM

## 2023-01-11 DIAGNOSIS — R5383 Other fatigue: Secondary | ICD-10-CM

## 2023-01-14 ENCOUNTER — Ambulatory Visit (HOSPITAL_COMMUNITY)
Admission: RE | Admit: 2023-01-14 | Discharge: 2023-01-14 | Disposition: A | Discharge status: Home / Self Care | Payer: BC Managed Care – PPO | Source: Ambulatory Visit | Attending: Rheumatology | Admitting: Rheumatology

## 2023-01-14 VITALS — BP 165/98 | HR 76 | Temp 97.8°F | Resp 17 | Wt 268.0 lb

## 2023-01-14 DIAGNOSIS — Z79899 Other long term (current) drug therapy: Secondary | ICD-10-CM | POA: Diagnosis not present

## 2023-01-14 DIAGNOSIS — M1990 Unspecified osteoarthritis, unspecified site: Secondary | ICD-10-CM | POA: Diagnosis present

## 2023-01-14 DIAGNOSIS — M0579 Rheumatoid arthritis with rheumatoid factor of multiple sites without organ or systems involvement: Secondary | ICD-10-CM | POA: Insufficient documentation

## 2023-01-14 LAB — CBC WITH DIFFERENTIAL/PLATELET
Abs Immature Granulocytes: 0.02 10*3/uL (ref 0.00–0.07)
Basophils Absolute: 0.1 10*3/uL (ref 0.0–0.1)
Basophils Relative: 1 %
Eosinophils Absolute: 0.2 10*3/uL (ref 0.0–0.5)
Eosinophils Relative: 2 %
HCT: 42.5 % (ref 36.0–46.0)
Hemoglobin: 13.1 g/dL (ref 12.0–15.0)
Immature Granulocytes: 0 %
Lymphocytes Relative: 44 %
Lymphs Abs: 3.8 10*3/uL (ref 0.7–4.0)
MCH: 27.1 pg (ref 26.0–34.0)
MCHC: 30.8 g/dL (ref 30.0–36.0)
MCV: 88 fL (ref 80.0–100.0)
Monocytes Absolute: 0.6 10*3/uL (ref 0.1–1.0)
Monocytes Relative: 7 %
Neutro Abs: 3.9 10*3/uL (ref 1.7–7.7)
Neutrophils Relative %: 46 %
Platelets: 221 10*3/uL (ref 150–400)
RBC: 4.83 MIL/uL (ref 3.87–5.11)
RDW: 12.5 % (ref 11.5–15.5)
WBC: 8.6 10*3/uL (ref 4.0–10.5)
nRBC: 0 % (ref 0.0–0.2)

## 2023-01-14 LAB — COMPREHENSIVE METABOLIC PANEL
ALT: 36 U/L (ref 0–44)
AST: 42 U/L — ABNORMAL HIGH (ref 15–41)
Albumin: 3.1 g/dL — ABNORMAL LOW (ref 3.5–5.0)
Alkaline Phosphatase: 59 U/L (ref 38–126)
Anion gap: 6 (ref 5–15)
BUN: 9 mg/dL (ref 6–20)
CO2: 28 mmol/L (ref 22–32)
Calcium: 8.6 mg/dL — ABNORMAL LOW (ref 8.9–10.3)
Chloride: 106 mmol/L (ref 98–111)
Creatinine, Ser: 0.83 mg/dL (ref 0.44–1.00)
GFR, Estimated: >60 mL/min (ref >60)
Glucose, Bld: 91 mg/dL (ref 70–99)
Potassium: 4.6 mmol/L (ref 3.5–5.1)
Sodium: 140 mmol/L (ref 135–145)
Total Bilirubin: 1 mg/dL (ref 0.3–1.2)
Total Protein: 6.2 g/dL — ABNORMAL LOW (ref 6.5–8.1)

## 2023-01-14 MED ORDER — DIPHENHYDRAMINE HCL 25 MG PO CAPS
ORAL_CAPSULE | ORAL | Status: AC
  Filled 2023-01-14: qty 1

## 2023-01-14 MED ORDER — DIPHENHYDRAMINE HCL 25 MG PO CAPS
25.0000 mg | ORAL_CAPSULE | ORAL | Status: DC
  Administered 2023-01-14: 25 mg via ORAL

## 2023-01-14 MED ORDER — ACETAMINOPHEN 325 MG PO TABS
ORAL_TABLET | ORAL | Status: AC
  Filled 2023-01-14: qty 2

## 2023-01-14 MED ORDER — SODIUM CHLORIDE 0.9 % IV SOLN
1000.0000 mg | INTRAVENOUS | Status: DC
  Administered 2023-01-14: 1000 mg via INTRAVENOUS
  Filled 2023-01-14: qty 40

## 2023-01-14 MED ORDER — ACETAMINOPHEN 325 MG PO TABS
650.0000 mg | ORAL_TABLET | ORAL | Status: DC
  Administered 2023-01-14: 650 mg via ORAL

## 2023-01-14 NOTE — Progress Notes (Signed)
Corrected calcium normal for low albumin.  ALT is mildly elevated.  Advised patient to avoid NSAIDs and Tylenol.  CBC is normal.

## 2023-01-17 ENCOUNTER — Encounter (HOSPITAL_COMMUNITY): Payer: BC Managed Care – PPO
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
## 2023-01-19 ENCOUNTER — Other Ambulatory Visit: Payer: Self-pay

## 2023-01-19 ENCOUNTER — Encounter: Payer: BC Managed Care – PPO | Attending: Rheumatology | Admitting: Physician Assistant

## 2023-01-19 ENCOUNTER — Encounter: Payer: Self-pay | Admitting: Physician Assistant

## 2023-01-19 VITALS — BP 137/80 | HR 64 | Resp 14 | Ht 62.0 in | Wt 276.0 lb

## 2023-01-19 DIAGNOSIS — M797 Fibromyalgia: Secondary | ICD-10-CM

## 2023-01-19 DIAGNOSIS — G4709 Other insomnia: Secondary | ICD-10-CM

## 2023-01-19 DIAGNOSIS — Z79899 Other long term (current) drug therapy: Secondary | ICD-10-CM

## 2023-01-19 DIAGNOSIS — M0579 Rheumatoid arthritis with rheumatoid factor of multiple sites without organ or systems involvement: Secondary | ICD-10-CM

## 2023-01-19 DIAGNOSIS — M170 Bilateral primary osteoarthritis of knee: Secondary | ICD-10-CM | POA: Diagnosis not present

## 2023-01-19 DIAGNOSIS — M7061 Trochanteric bursitis, right hip: Secondary | ICD-10-CM

## 2023-01-19 DIAGNOSIS — Z8659 Personal history of other mental and behavioral disorders: Secondary | ICD-10-CM

## 2023-01-19 DIAGNOSIS — G8929 Other chronic pain: Secondary | ICD-10-CM

## 2023-01-19 DIAGNOSIS — M7062 Trochanteric bursitis, left hip: Secondary | ICD-10-CM | POA: Insufficient documentation

## 2023-01-19 DIAGNOSIS — R5383 Other fatigue: Secondary | ICD-10-CM

## 2023-01-19 DIAGNOSIS — Z9884 Bariatric surgery status: Secondary | ICD-10-CM

## 2023-01-19 DIAGNOSIS — Z8639 Personal history of other endocrine, nutritional and metabolic disease: Secondary | ICD-10-CM

## 2023-01-19 DIAGNOSIS — Z8679 Personal history of other diseases of the circulatory system: Secondary | ICD-10-CM

## 2023-01-19 DIAGNOSIS — R911 Solitary pulmonary nodule: Secondary | ICD-10-CM

## 2023-01-19 DIAGNOSIS — E785 Hyperlipidemia, unspecified: Secondary | ICD-10-CM

## 2023-01-19 DIAGNOSIS — M25512 Pain in left shoulder: Secondary | ICD-10-CM | POA: Insufficient documentation

## 2023-01-19 MED ORDER — PREDNISONE 5 MG PO TABS: ORAL_TABLET | ORAL | 0 refills | Status: DC

## 2023-01-19 MED ORDER — FOLIC ACID 1 MG PO TABS: 2.0000 mg | ORAL_TABLET | Freq: Every day | ORAL | 3 refills | Status: DC

## 2023-01-19 NOTE — Telephone Encounter (Signed)
Please review and sign pended folic acid 2mg /daily refill. Thanks!

## 2023-02-06 ENCOUNTER — Other Ambulatory Visit: Payer: Self-pay | Admitting: Physician Assistant

## 2023-02-08 NOTE — Telephone Encounter (Signed)
Last Fill: 11/03/2022  Next Visit: Due January 2025. Message sent to the front to schedule.   Last Visit: 01/19/2023  Dx:  Primary osteoarthritis of both knees   Current Dose per office note on 01/19/2023: not discussed  Okay to refill Voltaren Gel?

## 2023-02-11 ENCOUNTER — Encounter (HOSPITAL_COMMUNITY): Payer: BC Managed Care – PPO

## 2023-02-19 ENCOUNTER — Other Ambulatory Visit: Payer: Self-pay | Admitting: Cardiology

## 2023-02-19 ENCOUNTER — Other Ambulatory Visit: Payer: Self-pay | Admitting: Physician Assistant

## 2023-02-21 ENCOUNTER — Other Ambulatory Visit: Payer: Self-pay | Admitting: Physician Assistant

## 2023-02-21 NOTE — Telephone Encounter (Signed)
Last Fill: 10/14/2022 ( Lidoderm patches), 11/03/2022 (Gabapentin)  Next Visit: Due January 2025. Message sent to the front to schedule.   Last Visit: 01/19/2023  Dx: Fibromyalgia   Current Dose per office note on 01/19/2023: uses Lidoderm patches as needed for pain relief. Gabapentin not discussed  Okay to refill Lidoderm and Gabapentin?

## 2023-02-21 NOTE — Telephone Encounter (Signed)
Please schedule patient a follow up visit. Patient due January 2025. Thanks!

## 2023-02-22 NOTE — Telephone Encounter (Signed)
Last Fill: 08/03/2022  Labs: 01/14/2023 Corrected calcium normal for low albumin.  ALT is mildly elevated.  CBC is normal.   Next Visit: 06/29/2023  Last Visit: 01/19/2023  DX: Rheumatoid arthritis involving multiple sites with positive rheumatoid factor   Current Dose per office note 01/19/2023:  Rasuvo 25 mg per 0.5 mL subcutaneous weekly   Okay to refill Rasuvo?

## 2023-03-05 ENCOUNTER — Other Ambulatory Visit: Payer: Self-pay | Admitting: Cardiology

## 2023-03-24 ENCOUNTER — Other Ambulatory Visit: Payer: Self-pay | Admitting: Cardiology

## 2023-03-30 ENCOUNTER — Other Ambulatory Visit: Payer: Self-pay | Admitting: Physician Assistant

## 2023-03-30 NOTE — Telephone Encounter (Signed)
Last Fill: 11/03/2022  Next Visit: 06/29/2023  Last Visit: 01/19/2023  Dx: Fibromyalgia   Current Dose per office note on 01/19/2023: not discussed  Okay to refill Flexeril?

## 2023-03-31 ENCOUNTER — Other Ambulatory Visit: Payer: Self-pay | Admitting: Cardiology

## 2023-04-08 DIAGNOSIS — I2510 Atherosclerotic heart disease of native coronary artery without angina pectoris: Secondary | ICD-10-CM

## 2023-04-08 HISTORY — DX: Atherosclerotic heart disease of native coronary artery without angina pectoris: I25.10

## 2023-04-13 ENCOUNTER — Ambulatory Visit (HOSPITAL_COMMUNITY)
Admission: RE | Admit: 2023-04-13 | Discharge: 2023-04-13 | Disposition: A | Discharge status: Home / Self Care | Payer: BC Managed Care – PPO | Source: Ambulatory Visit | Attending: Rheumatology | Admitting: Rheumatology

## 2023-04-13 ENCOUNTER — Other Ambulatory Visit: Payer: Self-pay | Admitting: Pharmacist

## 2023-04-13 DIAGNOSIS — M0579 Rheumatoid arthritis with rheumatoid factor of multiple sites without organ or systems involvement: Secondary | ICD-10-CM | POA: Diagnosis present

## 2023-04-13 DIAGNOSIS — Z79899 Other long term (current) drug therapy: Secondary | ICD-10-CM | POA: Insufficient documentation

## 2023-04-13 LAB — CBC WITH DIFFERENTIAL/PLATELET
Abs Immature Granulocytes: 0.02 10*3/uL (ref 0.00–0.07)
Basophils Absolute: 0.1 10*3/uL (ref 0.0–0.1)
Basophils Relative: 1 %
Eosinophils Absolute: 0.2 10*3/uL (ref 0.0–0.5)
Eosinophils Relative: 3 %
HCT: 44.8 % (ref 36.0–46.0)
Hemoglobin: 14 g/dL (ref 12.0–15.0)
Immature Granulocytes: 0 %
Lymphocytes Relative: 46 %
Lymphs Abs: 3.5 10*3/uL (ref 0.7–4.0)
MCH: 27 pg (ref 26.0–34.0)
MCHC: 31.3 g/dL (ref 30.0–36.0)
MCV: 86.3 fL (ref 80.0–100.0)
Monocytes Absolute: 0.5 10*3/uL (ref 0.1–1.0)
Monocytes Relative: 6 %
Neutro Abs: 3.3 10*3/uL (ref 1.7–7.7)
Neutrophils Relative %: 44 %
Platelets: 264 10*3/uL (ref 150–400)
RBC: 5.19 MIL/uL — ABNORMAL HIGH (ref 3.87–5.11)
RDW: 12.9 % (ref 11.5–15.5)
WBC: 7.5 10*3/uL (ref 4.0–10.5)
nRBC: 0 % (ref 0.0–0.2)

## 2023-04-13 LAB — COMPREHENSIVE METABOLIC PANEL
ALT: 23 U/L (ref 0–44)
AST: 40 U/L (ref 15–41)
Albumin: 3.5 g/dL (ref 3.5–5.0)
Alkaline Phosphatase: 62 U/L (ref 38–126)
Anion gap: 8 (ref 5–15)
BUN: 10 mg/dL (ref 6–20)
CO2: 28 mmol/L (ref 22–32)
Calcium: 9.2 mg/dL (ref 8.9–10.3)
Chloride: 107 mmol/L (ref 98–111)
Creatinine, Ser: 0.88 mg/dL (ref 0.44–1.00)
GFR, Estimated: >60 mL/min (ref >60)
Glucose, Bld: 91 mg/dL (ref 70–99)
Potassium: 4 mmol/L (ref 3.5–5.1)
Sodium: 143 mmol/L (ref 135–145)
Total Bilirubin: 1 mg/dL (ref <1.2)
Total Protein: 6.7 g/dL (ref 6.5–8.1)

## 2023-04-13 MED ORDER — SODIUM CHLORIDE 0.9 % IV SOLN
1000.0000 mg | INTRAVENOUS | Status: DC
  Administered 2023-04-13: 1000 mg via INTRAVENOUS
  Filled 2023-04-13: qty 40

## 2023-04-13 MED ORDER — DIPHENHYDRAMINE HCL 25 MG PO CAPS
ORAL_CAPSULE | ORAL | Status: AC
  Filled 2023-04-13: qty 1

## 2023-04-13 MED ORDER — ACETAMINOPHEN 325 MG PO TABS
ORAL_TABLET | ORAL | Status: AC
  Filled 2023-04-13: qty 2

## 2023-04-13 MED ORDER — DIPHENHYDRAMINE HCL 25 MG PO CAPS
25.0000 mg | ORAL_CAPSULE | ORAL | Status: DC
  Administered 2023-04-13: 25 mg via ORAL

## 2023-04-13 MED ORDER — ACETAMINOPHEN 325 MG PO TABS
650.0000 mg | ORAL_TABLET | ORAL | Status: DC
  Administered 2023-04-13: 650 mg via ORAL

## 2023-04-13 NOTE — Progress Notes (Signed)
CBC and CMP are normal.

## 2023-04-13 NOTE — Progress Notes (Signed)
Error

## 2023-04-14 ENCOUNTER — Ambulatory Visit: Payer: BC Managed Care – PPO | Attending: Cardiology | Admitting: Cardiology

## 2023-04-14 ENCOUNTER — Encounter: Payer: Self-pay | Admitting: Cardiology

## 2023-04-14 VITALS — BP 128/82 | HR 82 | Ht 62.0 in | Wt 270.6 lb

## 2023-04-14 DIAGNOSIS — I2510 Atherosclerotic heart disease of native coronary artery without angina pectoris: Secondary | ICD-10-CM

## 2023-04-14 DIAGNOSIS — R002 Palpitations: Secondary | ICD-10-CM

## 2023-04-14 DIAGNOSIS — I10 Essential (primary) hypertension: Secondary | ICD-10-CM

## 2023-04-14 DIAGNOSIS — E7800 Pure hypercholesterolemia, unspecified: Secondary | ICD-10-CM | POA: Diagnosis not present

## 2023-04-14 DIAGNOSIS — I517 Cardiomegaly: Secondary | ICD-10-CM

## 2023-04-14 DIAGNOSIS — G4719 Other hypersomnia: Secondary | ICD-10-CM

## 2023-04-14 DIAGNOSIS — R079 Chest pain, unspecified: Secondary | ICD-10-CM

## 2023-04-14 MED ORDER — METOPROLOL TARTRATE 100 MG PO TABS: 100.0000 mg | ORAL_TABLET | Freq: Once | ORAL | 0 refills | Status: AC

## 2023-04-14 MED ORDER — NITROGLYCERIN 0.4 MG SL SUBL: 0.4000 mg | SUBLINGUAL_TABLET | SUBLINGUAL | 2 refills | Status: AC | PRN

## 2023-04-14 NOTE — Addendum Note (Signed)
Addended by: Luellen Pucker on: 04/14/2023 10:52 AM   Modules accepted: Orders

## 2023-04-14 NOTE — Progress Notes (Signed)
Cardiology CONSULT Note    Date:  04/14/2023   ID:  [name] [name], DOB 10-Mar-1966, MRN [id number]  PCP:  [name], MD  Cardiologist:  Armanda Magic, MD   Chief Complaint  Patient presents with   Coronary Artery Disease   Hyperlipidemia   Hypertension    History of Present Illness:  [name] [name] is a 57 y.o. female with a history of diastolic dysfunction, rheumatoid arthritis, depression, atypical chest pain with minimal CAD by coronary CTA showing coronary calcium score 95th percentile but only 30% proximal LAD stenosis.    At her last office visit she was complaining of chest pain and 11/03/2021 she underwent coronary CTA which revealed a coronary calcium score of 273, separate ostia of the LAD and left circumflex of the left coronary cusp and nonobstructive CAD less than 25% in the proximal to mid LAD and proximal left circumflex.  It was felt that her chest pain was likely related to stress.  She is here today for followup and is doing well.  Unfortunately she tore her rotator cuff and had surgery earlier this year and then re tore it and had to have major reconstruction.  She has been noticing pain in her chest when she exerts herself and gets SOB and dizziness.  The pain is described as a pressure.  It can last a few minutes but resolves when she sits down.  She also has been having DOE that she thinks has gotten worse. She denies any PND, orthopnea, LE edema or syncope. She has been having problems with fluttering in her heart.  She is compliant with her meds and is tolerating meds with no SE.    Past Medical History:  Diagnosis Date   Anxiety    Bursitis    CAD (coronary artery disease), native coronary artery    Coronary CTA showed minimal calcified plaque in the proximal and mid LAD less than 25%, mild left atrial enlargement and a coronary calcium score to 73 which is 99th percentile for age and sex matched controls by coronary CTA 10-2021   Depression     Diastolic dysfunction    Dyslipidemia 01/30/2015   Esophageal ring    Fibromyalgia    Hiatal hernia    HSV-1 infection    Morbid obesity (HCC)    Osteoarthritis    Rheumatoid arthritis(714.0)    M05.79   Rheumatoid arthritis, seropositive, 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HCC)    Treated with Orencia, TB neg 09/12/2015   Spondylolysis     Past Surgical History:  Procedure Laterality Date   CARDIAC CATHETERIZATION N/A 06/11/2016   Procedure: Left Heart Cath and Coronary Angiography;  Surgeon: Corky Crafts, MD;  Location: [facility] INVASIVE CV LAB;  Service: Cardiovascular;  Laterality: N/A;   CESAREAN SECTION     COLONOSCOPY  07/27/2017   ESOPHAGEAL MANOMETRY N/A 10/28/2014   Procedure: ESOPHAGEAL MANOMETRY (EM);  Surgeon: Hilarie Fredrickson, MD;  Location: [facility] ENDOSCOPY;  Service: Endoscopy;  Laterality: N/A;   HERNIA REPAIR     reports surgery on 3 hernias, with 2 more present   LAPAROSCOPIC GASTRIC SLEEVE RESECTION  11/21/2012   [facility]   LEFT HEART CATHETERIZATION WITH CORONARY ANGIOGRAM N/A 08/26/2014   Procedure: LEFT HEART CATHETERIZATION WITH CORONARY ANGIOGRAM;  Surgeon: Runell Gess, MD;  Location: [facility] CATH LAB;  Service: Cardiovascular;  Laterality: N/A;   SHOULDER SURGERY Left 10/04/2022   TUBAL LIGATION      Current Medications: Current Meds  Medication Sig  Abatacept (ORENCIA IV) Inject 1,000 mg into the vein every 28 (twenty-eight) days.   acyclovir ointment (ZOVIRAX) 5 % as needed.   Acyclovir-Hydrocortisone 5-1 % CREA Apply topically to cold sores as needed   Adapalene 0.3 % gel Apply 1 application topically daily as needed (acne).    Alcohol Swabs (ALCOHOL WIPES) 70 % PADS Alcohol Prep Pads   ALPRAZolam (XANAX) 0.5 MG tablet TAKE 1 TABLET BY MOUTH NIGHTLY AS NEEDED FOR ANXIETY/SLEEP INDICATIONS ANXIOUS, INSOMNIA GRIEVING.   ARIPiprazole (ABILIFY) 2 MG tablet Take 2 mg by mouth daily.   aspirin EC 81 MG tablet Take 1 tablet (81 mg total) by mouth daily. Swallow whole.    atorvastatin (LIPITOR) 80 MG tablet    benzonatate (TESSALON) 100 MG capsule Take 1 capsule (100 mg total) by mouth every 8 (eight) hours as needed for cough.   betamethasone, augmented, (DIPROLENE) 0.05 % lotion Apply topically as needed.   buPROPion (WELLBUTRIN XL) 150 MG 24 hr tablet Take 3 tablets by mouth every morning.   cetirizine (ZYRTEC ALLERGY) 10 MG tablet Take 1 tablet (10 mg total) by mouth daily.   ciclopirox (LOPROX) 0.77 % cream as needed.   cyclobenzaprine (FLEXERIL) 10 MG tablet TAKE 1 TABLET BY MOUTH AT BEDTIME AS NEEDED FOR MUSCLE SPASMS   diclofenac Sodium (VOLTAREN) 1 % GEL APPLY 2-4 GRAMS TO AFFECTED JOINT 4 TIMES DAILY AS NEEDED.   diltiazem (CARDIZEM CD) 180 MG 24 hr capsule Take 1 capsule (180 mg total) by mouth daily.   doxepin (SINEQUAN) 25 MG capsule Take 25 mg by mouth at bedtime as needed.   EPINEPHrine 0.3 mg/0.3 mL IJ SOAJ injection    ezetimibe (ZETIA) 10 MG tablet TAKE 1 TABLET BY MOUTH EVERY DAY   famciclovir (FAMVIR) 500 MG tablet Take 15,000 mg by mouth daily as needed (fever blister).    famotidine (PEPCID) 20 MG tablet Take 20 mg by mouth as needed.   fluticasone (FLONASE) 50 MCG/ACT nasal spray Place 1 spray into both nostrils daily.   folic acid (FOLVITE) 1 MG tablet Take 2 tablets (2 mg total) by mouth daily.   gabapentin (NEURONTIN) 300 MG capsule TAKE 1 CAPSULE BY MOUTH 3 TIMES A DAY AS NEEDED FOR PAIN   hydrocortisone 2.5 % cream SMARTSIG:1 Topical Every Night   hydrOXYzine (VISTARIL) 25 MG capsule Take 25 mg by mouth as needed.   ketoconazole (NIZORAL) 2 % cream    ketoconazole (NIZORAL) 2 % shampoo    lidocaine (LIDODERM) 5 % APPLY 1 PATCH TO AFFECTED AREA FOR 12 HOURS IN A 24 HOUR PERIOD   LINZESS 145 MCG CAPS capsule Take 145 mcg by mouth daily.   methocarbamol (ROBAXIN) 500 MG tablet Take 1 tablet 3 times a day by oral route.   Methotrexate, PF, (RASUVO) 25 MG/0.5ML SOAJ INJECT 1 PEN UNDER THE SKIN ONCE EVERY WEEK    neomycin-polymyxin-hydrocortisone (CORTISPORIN) 3.5-10000-1 OTIC suspension as needed.   nitroGLYCERIN (NITROSTAT) 0.4 MG SL tablet PLACE 1 TABLET UNDER THE TONGUE EVERY 5 MINUTES AS NEEDED FOR CHEST PAIN.   NYAMYC powder Apply topically.   nystatin cream (MYCOSTATIN) Apply topically.   Oxymetazoline HCl (NASAL SPRAY) 0.05 % SOLN    pantoprazole (PROTONIX) 40 MG tablet Take by mouth.   progesterone (PROMETRIUM) 100 MG capsule Take 100 mg by mouth daily.   propranolol (INDERAL) 10 MG tablet Take 10 mg by mouth 2 (two) times daily.   pseudoephedrine (SUDAFED) 60 MG tablet Take 1 tablet (60 mg total) by mouth every 8 (  eight) hours as needed for congestion.   rosuvastatin (CRESTOR) 40 MG tablet Take 1 tablet (40 mg total) by mouth daily.   Triamcinolone Acetonide 0.025 % LOTN as needed.   valACYclovir (VALTREX) 500 MG tablet Take 500 mg by mouth as needed.   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  Tobacco Use   Smoking status: Never    Passive exposure: Never   Smokeless tobacco: Never  Vaping Use   Vaping status: Never Used  Substance and Sexual Activity   Alcohol use: No   Drug use: No   Sexual activity: Yes    Partners: Male    Birth control/protection: None  Other Topics Concern   Not on file  Social History Narrative   Marital Status: Married Probation officer)    Children:  Son [name]) Daughter Teacher, adult education)    Pets: None    Living Situation: Lives with husband and children    Occupation:  Occupational psychologist Administrator)     Education: Oncologist (Psychology)     Tobacco Use/Exposure:  None    Alcohol Use:  Occasional   Drug Use:  None   Diet:  Regular   Exercise: Walking or Treadmill (2 x week)   Hobbies: Clinical cytogeneticist, [employer] Decorations.               Social Determinants of Health   Financial Resource Strain: Not on file  Food Insecurity: Low Risk  (03/28/2023)   Received from [facility]   Hunger Vital Sign    Worried About Running Out of Food in the Last Year: Never true    Ran Out of Food in the Last Year: Never true  Transportation Needs: No Transportation Needs (03/28/2023)   Received from [facility]    In the past 12 months, has lack of reliable transportation kept you from medical appointments, meetings, work or from getting things needed for daily living? : No  Physical Activity: Not on file  Stress: Not on file  Social Connections: Not on file     Family History:  The patient's family history includes Cancer in her father; Depression in her mother; Diabetes in her maternal grandmother; Heart attack in her paternal grandmother; Hyperlipidemia in her mother; Hypertension in her maternal grandmother and paternal grandmother; Lung disease in her father; Sarcoidosis in her father.   ROS:   Please see the history of present illness.    ROS All other systems reviewed and are negative.      No data to display             PHYSICAL EXAM:   VS:  BP 128/82   Pulse 82   Ht 5\' 2"  (1.575 m)   Wt 270 lb 9.6 oz (122.7 kg)   SpO2 96%   BMI 49.49 kg/m    GEN: Well nourished, well developed in no acute distress HEENT: Normal NECK: No JVD; No carotid bruits LYMPHATICS: No lymphadenopathy CARDIAC:RRR, no murmurs, rubs, gallops RESPIRATORY:  Clear  to auscultation without rales, wheezing or rhonchi  ABDOMEN: Soft, non-tender, non-distended MUSCULOSKELETAL:  No edema; No deformity  SKIN: Warm and dry NEUROLOGIC:   Alert and oriented x 3 PSYCHIATRIC:  Normal affect  Wt Readings from Last 3 Encounters:  04/14/23 270 lb 9.6 oz (122.7 kg)  04/13/23 273 lb (123.8 kg)  01/19/23 276 lb (125.2 kg)      Studies/Labs Reviewed:    Recent Labs: 04/13/2023: ALT 23; BUN 10; Creatinine, Ser 0.88; Hemoglobin 14.0; Platelets 264; Potassium 4.0; Sodium 143   Lipid Panel    Component Value Date/Time   CHOL 172 12/14/2021 0939   TRIG 90 12/14/2021 0939   HDL 51 12/14/2021 0939   CHOLHDL 3.4 12/14/2021 0939   CHOLHDL 3 02/06/2015 1055   VLDL 16.4 02/06/2015 1055   LDLCALC 104 (H) 12/14/2021 0939    Additional studies/ records that were reviewed today include:  none    ASSESSMENT:    1. Coronary artery disease involving native coronary artery of native heart without angina pectoris   2. Pure hypercholesterolemia   3. Essential hypertension   4. Cardiomegaly   5. Excessive daytime sleepiness   6. Palpitations       PLAN:  In order of problems listed above:  ASCAD -Coronary CTA in 2017 showed minimal nonobstructive CAD with 30% proximal LAD stenosis. -Repeat coronary CTA 10/2021 for recurrent chest pain showed coronary calcium score of 273, separate ostia of the LAD and left circumflex of the left coronary cusp and nonobstructive CAD less than 25% in the proximal to mid LAD and proximal left circumflex.  -She is now having chest pressure with exertion but somewhat vague in description but associated with SOB.   -I am going to repeat coronary CTA to redefine coronary anatomy -Continue prescription drug management with aspirin 81 mg daily, atorvastatin 80 mg daily with as needed refills  2.  HLD -LDL goal less than 70 -I have personally reviewed and interpreted outside labs performed by patient's PCP which showed LDL 153 ad HDL 56 on 03/28/2023 -Continue prescription drug management with Crestor 40 mg daily and Zetia 10 mg daily with as needed refills   3.  Hypertension -BP is well-controlled  on exam today -Continue prescription drug management with Cardizem CD 180 mg daily and Propranolol 10mg  BID with as needed refills  4.  Cardiomegaly -noted on Cxray -likely related to  pericardial fat pad -2D echo 10/24/2021 showed normal LV size and systolic function EF 60 to 65% with no cardiomegaly and normal RV size and function.  5.  Excessive daytime sleepiness -She was post to have a home sleep study at last office visit but did not complete this>>she does not want to do a HST she would prefer an in lab PSG which I will arrange  6.  Palpitations -she has been having a lot of palpitations so I will get a 2 week ziopatch  Followup with me in 1 year  Time Spent: 20 minutes total time of encounter, including 15 minutes spent in face-to-face patient care on the date of this encounter. This time includes coordination of care and counseling regarding above mentioned problem list. Remainder of non-face-to-face time involved reviewing chart documents/testing relevant to the patient encounter and documentation in the medical record. I have independently reviewed documentation from referring provider  Medication Adjustments/Labs and Tests Ordered: Current medicines are reviewed at length with the patient today.  Concerns regarding medicines are outlined above.  Medication changes, Labs and Tests ordered today are  listed in the Patient Instructions below.  There are no Patient Instructions on file for this visit.   Signed, Armanda Magic, MD  04/14/2023 10:32 AM    [facility] [address] [place], [place], [place]  [address] Phone: [phone]; Fax: [phone]

## 2023-04-14 NOTE — Addendum Note (Signed)
Addended by: Luellen Pucker on: 04/14/2023 10:42 AM   Modules accepted: Orders

## 2023-04-14 NOTE — Patient Instructions (Addendum)
Medication Instructions:  Your physician recommends that you continue on your current medications as directed. Please refer to the Current Medication list given to you today.   Lab Work: None.  If you have labs (blood work) drawn today and your tests are completely normal, you will receive your results only by: MyChart Message (if you have MyChart) OR A paper copy in the mail If you have any lab test that is abnormal or we need to change your treatment, we will call you to review the results.   Testing/Procedures: Your physician has recommended that you have a sleep study. This test records several body functions during sleep, including: brain activity, eye movement, oxygen and carbon dioxide blood levels, heart rate and rhythm, breathing rate and rhythm, the flow of air through your mouth and nose, snoring, body muscle movements, and chest and belly movement. Someone will call you from the sleep center once your insurance approves.     Your cardiac CT will be scheduled at one of the below locations:   [facility] [address] [place], [place] [address] [phone]  Please arrive at the [facility] (Entrance C2) of [facility] 30 minutes prior to test start time. You can use the FREE valet parking offered at entrance C (encouraged to control the heart rate for the test)  Proceed to the [facility] Radiology Department (first floor) to check-in and test prep.  All radiology patients and guests should use entrance C2 at [facility], accessed from [facility], even though the hospital's physical address listed is [address].     Please follow these instructions carefully (unless otherwise directed):  An IV will be required for this test and Nitroglycerin will be given.    On the Night Before the Test: Be sure to Drink plenty of water. Do not consume any caffeinated/decaffeinated beverages or chocolate  12 hours prior to your test. Do not take any antihistamines 12 hours prior to your test.  On the Day of the Test: Drink plenty of water until 1 hour prior to the test. Do not eat any food 1 hour prior to test. You may take your regular medications prior to the test.  Take one time dose of 100 mg metoprolol (Lopressor) two hours prior to test. FEMALES- please wear underwire-free bra if available, avoid dresses & tight clothing       After the Test: Drink plenty of water. After receiving IV contrast, you may experience a mild flushed feeling. This is normal. On occasion, you may experience a mild rash up to 24 hours after the test. This is not dangerous. If this occurs, you can take Benadryl 25 mg and increase your fluid intake. If you experience trouble breathing, this can be serious. If it is severe call 911 IMMEDIATELY. If it is mild, please call our office.  We will call to schedule your test 2-4 weeks out understanding that some insurance companies will need an authorization prior to the service being performed.   For more information and frequently asked questions, please visit our website : [email or web address]  For non-scheduling related questions, please contact the cardiac imaging nurse navigator should you have any questions/concerns: Cardiac Imaging Nurse Navigators Direct Office Dial: [phone]   For scheduling needs, including cancellations and rescheduling, please call [place], [phone].    Follow-Up: At [facility], you and your health needs are our priority.  As part of our continuing mission to provide you  with exceptional heart care, we have created designated Provider Care Teams.  These Care Teams include your primary Cardiologist (physician) and Advanced Practice Providers (APPs -  Physician Assistants and Nurse Practitioners) who all work together to provide you with the care you need, when you need it.  We recommend signing up for  the patient portal called "MyChart".  Sign up information is provided on this After Visit Summary.  MyChart is used to connect with patients for Virtual Visits (Telemedicine).  Patients are able to view lab/test results, encounter notes, upcoming appointments, etc.  Non-urgent messages can be sent to your provider as well.   To learn more about what you can do with MyChart, go to [email or web address].    Your next appointment:   1 year(s)  Provider:   Armanda Magic, MD

## 2023-04-15 ENCOUNTER — Telehealth: Payer: Self-pay | Admitting: Pharmacist

## 2023-04-15 NOTE — Telephone Encounter (Signed)
Patient's IV Orencia infusion is scheduled for 06/09/2023. Will need to confirm no insurance changes and renew precertification prior to this date to ensure no lapse in coverage  Chesley Mires, PharmD, MPH, BCPS, CPP Clinical Pharmacist (Rheumatology and Pulmonology)

## 2023-04-22 ENCOUNTER — Other Ambulatory Visit: Payer: Self-pay | Admitting: Cardiology

## 2023-04-27 ENCOUNTER — Telehealth (HOSPITAL_COMMUNITY): Payer: Self-pay | Admitting: Emergency Medicine

## 2023-04-27 ENCOUNTER — Encounter: Payer: Self-pay | Admitting: Cardiology

## 2023-04-27 ENCOUNTER — Encounter (HOSPITAL_COMMUNITY): Payer: Self-pay

## 2023-04-27 NOTE — Telephone Encounter (Signed)
Attempted to call patient regarding upcoming cardiac CT appointment. °Left message on voicemail with name and callback number °Dwan Fennel RN Navigator Cardiac Imaging °[facility] °[phone] Office °[phone] Cell ° °

## 2023-04-29 ENCOUNTER — Ambulatory Visit (HOSPITAL_COMMUNITY)
Admission: RE | Admit: 2023-04-29 | Discharge: 2023-04-29 | Disposition: A | Discharge status: Home / Self Care | Payer: BC Managed Care – PPO | Source: Ambulatory Visit | Attending: Cardiology | Admitting: Cardiology

## 2023-04-29 DIAGNOSIS — R072 Precordial pain: Secondary | ICD-10-CM

## 2023-04-29 DIAGNOSIS — R079 Chest pain, unspecified: Secondary | ICD-10-CM | POA: Insufficient documentation

## 2023-04-29 MED ORDER — IOHEXOL 350 MG/ML SOLN
140.0000 mL | Freq: Once | INTRAVENOUS | Status: AC | PRN
  Administered 2023-04-29: 140 mL via INTRAVENOUS

## 2023-04-29 MED ORDER — DILTIAZEM HCL 25 MG/5ML IV SOLN: 10.0000 mg | INTRAVENOUS | Status: DC | PRN

## 2023-04-29 MED ORDER — NITROGLYCERIN 0.4 MG SL SUBL
SUBLINGUAL_TABLET | SUBLINGUAL | Status: AC
  Filled 2023-04-29: qty 2

## 2023-04-29 MED ORDER — NITROGLYCERIN 0.4 MG SL SUBL
0.8000 mg | SUBLINGUAL_TABLET | Freq: Once | SUBLINGUAL | Status: AC
  Administered 2023-04-29: 0.8 mg via SUBLINGUAL

## 2023-04-29 MED ORDER — METOPROLOL TARTRATE 5 MG/5ML IV SOLN: 10.0000 mg | Freq: Once | INTRAVENOUS | Status: DC | PRN

## 2023-04-30 ENCOUNTER — Other Ambulatory Visit: Payer: Self-pay

## 2023-04-30 ENCOUNTER — Telehealth: Payer: Self-pay | Admitting: Cardiology

## 2023-04-30 ENCOUNTER — Emergency Department (HOSPITAL_COMMUNITY): Payer: BC Managed Care – PPO

## 2023-04-30 ENCOUNTER — Emergency Department (HOSPITAL_COMMUNITY)
Admission: EM | Admit: 2023-04-30 | Discharge: 2023-04-30 | Disposition: A | Discharge status: Home / Self Care | Payer: BC Managed Care – PPO | Attending: Emergency Medicine | Admitting: Emergency Medicine

## 2023-04-30 DIAGNOSIS — Z7982 Long term (current) use of aspirin: Secondary | ICD-10-CM | POA: Insufficient documentation

## 2023-04-30 DIAGNOSIS — R079 Chest pain, unspecified: Secondary | ICD-10-CM

## 2023-04-30 DIAGNOSIS — I2510 Atherosclerotic heart disease of native coronary artery without angina pectoris: Secondary | ICD-10-CM | POA: Insufficient documentation

## 2023-04-30 DIAGNOSIS — R0789 Other chest pain: Secondary | ICD-10-CM | POA: Insufficient documentation

## 2023-04-30 DIAGNOSIS — R42 Dizziness and giddiness: Secondary | ICD-10-CM | POA: Diagnosis not present

## 2023-04-30 LAB — CBC
HCT: 43.8 % (ref 36.0–46.0)
Hemoglobin: 13.7 g/dL (ref 12.0–15.0)
MCH: 26.4 pg (ref 26.0–34.0)
MCHC: 31.3 g/dL (ref 30.0–36.0)
MCV: 84.4 fL (ref 80.0–100.0)
Platelets: 254 10*3/uL (ref 150–400)
RBC: 5.19 MIL/uL — ABNORMAL HIGH (ref 3.87–5.11)
RDW: 13 % (ref 11.5–15.5)
WBC: 8.2 10*3/uL (ref 4.0–10.5)
nRBC: 0 % (ref 0.0–0.2)

## 2023-04-30 LAB — BASIC METABOLIC PANEL
Anion gap: 9 (ref 5–15)
BUN: 11 mg/dL (ref 6–20)
CO2: 24 mmol/L (ref 22–32)
Calcium: 9.2 mg/dL (ref 8.9–10.3)
Chloride: 108 mmol/L (ref 98–111)
Creatinine, Ser: 0.81 mg/dL (ref 0.44–1.00)
GFR, Estimated: >60 mL/min (ref >60)
Glucose, Bld: 80 mg/dL (ref 70–99)
Potassium: 3.5 mmol/L (ref 3.5–5.1)
Sodium: 141 mmol/L (ref 135–145)

## 2023-04-30 LAB — TROPONIN I (HIGH SENSITIVITY): Troponin I (High Sensitivity): 4 ng/L (ref <18)

## 2023-04-30 LAB — HCG, SERUM, QUALITATIVE: Preg, Serum: NEGATIVE

## 2023-04-30 MED ORDER — FAMOTIDINE 20 MG PO TABS
20.0000 mg | ORAL_TABLET | Freq: Once | ORAL | Status: AC
  Administered 2023-04-30: 20 mg via ORAL
  Filled 2023-04-30: qty 1

## 2023-04-30 MED ORDER — ACETAMINOPHEN 500 MG PO TABS
1000.0000 mg | ORAL_TABLET | Freq: Once | ORAL | Status: AC
  Administered 2023-04-30: 1000 mg via ORAL
  Filled 2023-04-30: qty 2

## 2023-04-30 MED ORDER — KETOROLAC TROMETHAMINE 15 MG/ML IJ SOLN: 15.0000 mg | Freq: Once | INTRAMUSCULAR | Status: DC

## 2023-04-30 MED ORDER — LORAZEPAM 2 MG/ML IJ SOLN: 1.0000 mg | Freq: Once | INTRAMUSCULAR | Status: DC | PRN

## 2023-04-30 MED ORDER — SODIUM CHLORIDE 0.9 % IV BOLUS: 1000.0000 mL | Freq: Once | INTRAVENOUS | Status: DC

## 2023-04-30 MED ORDER — LORAZEPAM 1 MG PO TABS
1.0000 mg | ORAL_TABLET | Freq: Once | ORAL | Status: AC
  Administered 2023-04-30: 1 mg via ORAL
  Filled 2023-04-30: qty 1

## 2023-04-30 MED ORDER — METOCLOPRAMIDE HCL 5 MG/ML IJ SOLN
10.0000 mg | Freq: Once | INTRAMUSCULAR | Status: DC
  Filled 2023-04-30: qty 2

## 2023-04-30 MED ORDER — MECLIZINE HCL 25 MG PO TABS: 25.0000 mg | ORAL_TABLET | Freq: Three times a day (TID) | ORAL | 0 refills | Status: AC | PRN

## 2023-04-30 MED ORDER — DIAZEPAM 5 MG PO TABS
5.0000 mg | ORAL_TABLET | Freq: Once | ORAL | Status: AC
  Administered 2023-04-30: 5 mg via ORAL
  Filled 2023-04-30: qty 1

## 2023-04-30 MED ORDER — MECLIZINE HCL 25 MG PO TABS
25.0000 mg | ORAL_TABLET | Freq: Once | ORAL | Status: AC
  Administered 2023-04-30: 25 mg via ORAL
  Filled 2023-04-30: qty 1

## 2023-04-30 MED ORDER — KETOROLAC TROMETHAMINE 15 MG/ML IJ SOLN
15.0000 mg | Freq: Once | INTRAMUSCULAR | Status: AC
  Administered 2023-04-30: 15 mg via INTRAVENOUS
  Filled 2023-04-30: qty 1

## 2023-04-30 MED ORDER — ONDANSETRON 4 MG PO TBDP
8.0000 mg | ORAL_TABLET | Freq: Once | ORAL | Status: AC
  Administered 2023-04-30: 8 mg via ORAL
  Filled 2023-04-30: qty 2

## 2023-04-30 NOTE — ED Notes (Signed)
PT WAS GIVEN IM/ORAL MEDS DUE TO ULTRASOUND IV INFILTRATION/PAIN. PA MADE AWARE AND IV REMOVED FOR PT COMFORT.

## 2023-04-30 NOTE — ED Provider Notes (Signed)
[place] EMERGENCY DEPARTMENT AT [facility] Provider Note   CSN: [id number] Arrival date & time: 04/30/23  1136     History  Chief Complaint  Patient presents with   Chest Pain   HPI [name] [name] is a 57 y.o. female with CAD presenting for chest pain and associated dizziness. Started this morning around 8 AM. It is midsternal. It is worse with exertion. Associates shortness of breath as well. Dizziness is worse with standing. Endorses room spinning. Had recent cardiac CT. also endorsing intermittent palpitations.   Chest Pain      Home Medications Prior to Admission medications   Medication Sig Start Date End Date Taking? Authorizing Provider  Abatacept (ORENCIA IV) Inject 1,000 mg into the vein every 28 (twenty-eight) days.    Pollyann Savoy, MD  acyclovir ointment (ZOVIRAX) 5 % as needed.    [provider]  Acyclovir-Hydrocortisone 5-1 % CREA Apply topically to cold sores as needed 12/14/22   Margaretann Loveless, PA-C  Adapalene 0.3 % gel Apply 1 application topically daily as needed (acne).  12/31/14   [provider]  Alcohol Swabs (ALCOHOL WIPES) 70 % PADS Alcohol Prep Pads    [provider]  ALPRAZolam (XANAX) 0.5 MG tablet TAKE 1 TABLET BY MOUTH NIGHTLY AS NEEDED FOR ANXIETY/SLEEP INDICATIONS ANXIOUS, INSOMNIA GRIEVING.    [provider]  ARIPiprazole (ABILIFY) 2 MG tablet Take 2 mg by mouth daily. 09/25/21   [provider]  aspirin EC 81 MG tablet Take 1 tablet (81 mg total) by mouth daily. Swallow whole. 10/13/21   Quintella Reichert, MD  atorvastatin (LIPITOR) 80 MG tablet     [provider]  benzonatate (TESSALON) 100 MG capsule Take 1 capsule (100 mg total) by mouth every 8 (eight) hours as needed for cough. 12/13/22   Gustavus Bryant, FNP  betamethasone, augmented, (DIPROLENE) 0.05 % lotion Apply topically as needed. 01/23/22   [provider]  buPROPion (WELLBUTRIN XL) 150 MG 24 hr  tablet Take 3 tablets by mouth every morning.    [provider]  cetirizine (ZYRTEC ALLERGY) 10 MG tablet Take 1 tablet (10 mg total) by mouth daily. 10/08/21   Wallis Bamberg, PA-C  ciclopirox (LOPROX) 0.77 % cream as needed.    [provider]  cyclobenzaprine (FLEXERIL) 10 MG tablet TAKE 1 TABLET BY MOUTH AT BEDTIME AS NEEDED FOR MUSCLE SPASMS 03/30/23   Pollyann Savoy, MD  diclofenac Sodium (VOLTAREN) 1 % GEL APPLY 2-4 GRAMS TO AFFECTED JOINT 4 TIMES DAILY AS NEEDED. 02/08/23   Gearldine Bienenstock, PA-C  diltiazem (CARDIZEM CD) 180 MG 24 hr capsule TAKE 1 CAPSULE BY MOUTH EVERY DAY 04/22/23   Quintella Reichert, MD  doxepin (SINEQUAN) 25 MG capsule Take 25 mg by mouth at bedtime as needed. 04/28/19   [provider]  EPINEPHrine 0.3 mg/0.3 mL IJ SOAJ injection  07/02/19   [provider]  ezetimibe (ZETIA) 10 MG tablet TAKE 1 TABLET BY MOUTH EVERY DAY 01/05/23   Quintella Reichert, MD  famciclovir (FAMVIR) 500 MG tablet Take 15,000 mg by mouth daily as needed (fever blister).  01/21/15   [provider]  famotidine (PEPCID) 20 MG tablet Take 20 mg by mouth as needed. 09/22/21   [provider]  fluticasone (FLONASE) 50 MCG/ACT nasal spray Place 1 spray into both nostrils daily. 12/13/22   Gustavus Bryant, FNP  folic acid (FOLVITE) 1 MG tablet Take 2 tablets (2 mg total) by  mouth daily. 01/19/23   Gearldine Bienenstock, PA-C  gabapentin (NEURONTIN) 300 MG capsule TAKE 1 CAPSULE BY MOUTH 3 TIMES A DAY AS NEEDED FOR PAIN 02/21/23   Pollyann Savoy, MD  hydrocortisone 2.5 % cream SMARTSIG:1 Topical Every Night 11/06/19   [provider]  hydrOXYzine (VISTARIL) 25 MG capsule Take 25 mg by mouth as needed. 10/04/21   [provider]  ketoconazole (NIZORAL) 2 % cream  03/14/20   [provider]  ketoconazole (NIZORAL) 2 % shampoo  06/03/20   [provider]  lidocaine (LIDODERM) 5 % APPLY 1 PATCH TO AFFECTED AREA FOR 12 HOURS IN A 24 HOUR  PERIOD 02/21/23   Pollyann Savoy, MD  LINZESS 145 MCG CAPS capsule Take 145 mcg by mouth daily. 01/26/22   [provider]  methocarbamol (ROBAXIN) 500 MG tablet Take 1 tablet 3 times a day by oral route. 09/24/22   [provider]  Methotrexate, PF, (RASUVO) 25 MG/0.5ML SOAJ INJECT 1 PEN UNDER THE SKIN ONCE EVERY WEEK 02/22/23   Pollyann Savoy, MD  metoprolol tartrate (LOPRESSOR) 100 MG tablet Take 1 tablet (100 mg total) by mouth once for 1 dose. Take 90-120 minutes prior to scan. 04/14/23 04/14/23  Quintella Reichert, MD  neomycin-polymyxin-hydrocortisone (CORTISPORIN) 3.5-10000-1 OTIC suspension as needed.    [provider]  nitroGLYCERIN (NITROSTAT) 0.4 MG SL tablet Place 1 tablet (0.4 mg total) under the tongue every 5 (five) minutes as needed for chest pain. 04/14/23   Quintella Reichert, MD  [facility] powder Apply topically. 03/17/20   [provider]  nystatin cream (MYCOSTATIN) Apply topically. 01/23/22   [provider]  Oxymetazoline HCl (NASAL SPRAY) 0.05 % SOLN     [provider]  pantoprazole (PROTONIX) 40 MG tablet Take by mouth. 09/22/21   [provider]  progesterone (PROMETRIUM) 100 MG capsule Take 100 mg by mouth daily. 09/16/21   [provider]  propranolol (INDERAL) 10 MG tablet Take 10 mg by mouth 2 (two) times daily.    [provider]  pseudoephedrine (SUDAFED) 60 MG tablet Take 1 tablet (60 mg total) by mouth every 8 (eight) hours as needed for congestion. 10/08/21   Wallis Bamberg, PA-C  rosuvastatin (CRESTOR) 40 MG tablet Take 1 tablet (40 mg total) by mouth daily. 04/01/23   Quintella Reichert, MD  Triamcinolone Acetonide 0.025 % LOTN as needed.    [provider]  valACYclovir (VALTREX) 500 MG tablet Take 500 mg by mouth as needed. 09/17/21   [provider]      Allergies    Influenza vaccines, Influenza virus vaccine, Amitriptyline, and Isosorbide    Review of Systems   Review of  Systems  Cardiovascular:  Positive for chest pain.    Physical Exam   Vitals:   04/30/23 1727 04/30/23 1732  BP:    Pulse:  (!) 58  Resp:  16  Temp: 97.6 F (36.4 C)   SpO2:  98%    CONSTITUTIONAL:  well-appearing, NAD NEURO:  GCS 15. Speech is goal oriented. No deficits appreciated to CN III-XII; symmetric eyebrow raise, no facial drooping, tongue midline. Patient has equal grip strength bilaterally with 5/5 strength against resistance in all major muscle groups bilaterally. Sensation to light touch intact. Patient moves extremities without ataxia. Normal finger-nose-finger. Patient ambulatory with steady gait. EYES:  eyes equal and reactive ENT/NECK:  Supple, no stridor  CARDIO:  regular rate and rhythm, appears well-perfused  PULM:  No respiratory distress, CTAB GI/GU:  non-distended, soft MSK/SPINE:  No gross deformities, no edema, moves all extremities  SKIN:  no rash, atraumatic   *Additional and/or pertinent findings included in MDM below    ED Results / Procedures / Treatments   Labs (all labs ordered are listed, but only abnormal results are displayed) Labs Reviewed  CBC - Abnormal; Notable for the following components:      Result Value   RBC 5.19 (*)    All other components within normal limits  BASIC METABOLIC PANEL  HCG, SERUM, QUALITATIVE  TROPONIN I (HIGH SENSITIVITY)    EKG EKG Interpretation Date/Time:  Saturday April 30 2023 11:56:12 EST Ventricular Rate:  60 PR Interval:  114 QRS Duration:  84 QT Interval:  418 QTC Calculation: 418 R Axis:   -88  Text Interpretation: Normal sinus rhythm Left axis deviation Cannot rule out Anterior infarct , age undetermined Abnormal ECG When compared with ECG of 07-Dec-2022 15:36, PREVIOUS ECG IS PRESENT No acute changes No significant change since last tracing Confirmed by Derwood Kaplan [phone]) on 04/30/2023 3:41:50 PM  Radiology CT CORONARY MORPH W/CTA COR W/SCORE W/CA W/CM &/OR WO/CM  Result Date:  04/30/2023 HISTORY: Chest pain, nonspecific EXAM: Cardiac/Coronary  CT TECHNIQUE: The patient was scanned on a [employer]. PROTOCOL: A non-contrast, gated CT scan was obtained with axial slices of 3 mm through the heart for calcium scoring. Calcium scoring was performed using the Agatston method. A 120 kV prospective, gated, contrast cardiac scan was obtained. Gantry rotation speed was 250 msecs and collimation was 0.6 mm. Two sublingual nitroglycerin tablets (0.8 mg) were given. The 3D data set was reconstructed in 5% intervals of the 35-75% of the R-R cycle. Diastolic phases were analyzed on a dedicated workstation using MPR, MIP, and VRT modes. The patient received 95 cc of contrast. FINDINGS: Image quality: Average. Artifact: Moderate (signal-to-noise (obese), mis-registration). Coronary artery calcification score: Left main: 0 Left anterior descending artery: 224 Left circumflex artery: 63.2 Right coronary artery: 1.3 Total coronary calcium score of 289, places the patient at the 98th percentile for age and sex matched control. Coronary arteries: Normal coronary origins.  Right dominance. Left Main Coronary Artery: Absent. Separate ostia of LAD and LCX from the left coronary cusp. Left Anterior Descending Artery: Normal caliber vessel, wraps the apex, gives off 2 diagonal branches. Minimal calcified plaque (0-24%) at the proximal and mid LAD. Otherwise the vessel is patent. First Diagonal branch: Small vessel, patent. Second Diagonal branch: Small vessel, patent. Left Circumflex Artery: Normal caliber vessel, non-dominant, travels within the atrioventricular groove, gives off 1 obtuse marginal branches. Minimal calcified plaque (0-24%) at the proximal LCX. Otherwise the vessel is patent. First Obtuse Marginal branch: Normal caliber, large size, patent. Right Coronary Artery: Dominant vessel, originates from the right coronary cusp, terminates as a PDA and right posterolateral branch. RCA is patent.  Left Atrium: Grossly normal in size with no left atrial appendage filling defect. Left Ventricle: Grossly normal in size. There are no stigmata of prior infarction. There is no abnormal filling defect. Pulmonary arteries: Normal in size without proximal filling defect. Pulmonary veins: Normal pulmonary venous drainage. Aorta: Normal size, 36.4 mm at the mid ascending aorta (level of the PA bifurcation) measured double oblique. No calcifications. No dissection. Pericardium: Normal thickness with no significant effusion or calcium present. Aortic Valve:Trileaflet without significant calcification. Mitral Valve: Normal structure without significant calcification. Extra-cardiac findings: See attached radiology report for non-cardiac structures. IMPRESSION: 1. Coronary calcium score of 289 . This was 98th percentile  for age-, sex, and race-matched controls. 2. Total plaque volume (TPV) 329 mm3 which is 87th percentile for age- and sex-matched controls (calcified plaque 55 mm3; non-calcified plaque 274 mm3 (low attenuation plaque 2 mm3)). TPV is severe. 3.  Normal coronary origin with right dominance. 4.  CAD-RADS = 1. Separate ostia of LAD and LCX from the left coronary cusp. Minimal calcified plaque (0-24%) at the proximal and mid LAD. Minimal calcified plaque (0-24%) at the proximal LCX. RCA is patent. RECOMMENDATIONS: Consider non-atherosclerotic causes of chest pain. Consider preventive therapy and risk factor modification. Electronically Signed   By: Tessa Lerner D.O.   On: 04/30/2023 18:15   DG Chest 2 View  Result Date: 04/30/2023 CLINICAL DATA:  Chest pain EXAM: CHEST - 2 VIEW COMPARISON:  December 07, 2022, July 26, 2022 FINDINGS: The cardiomediastinal silhouette is unchanged in contour. No pleural effusion. No pneumothorax. No acute pleuroparenchymal abnormality. Visualized abdomen is unremarkable. Mild degenerative changes of the thoracic spine. IMPRESSION: No acute cardiopulmonary abnormality.  Electronically Signed   By: Meda Klinefelter M.D.   On: 04/30/2023 13:44    Procedures Procedures    Medications Ordered in ED Medications  acetaminophen (TYLENOL) tablet 1,000 mg (1,000 mg Oral Given 04/30/23 1437)  famotidine (PEPCID) tablet 20 mg (20 mg Oral Given 04/30/23 1437)  meclizine (ANTIVERT) tablet 25 mg (25 mg Oral Given 04/30/23 1437)  diazepam (VALIUM) tablet 5 mg (5 mg Oral Given 04/30/23 1650)  ketorolac (TORADOL) 15 MG/ML injection 15 mg (15 mg Intravenous Given 04/30/23 1759)  LORazepam (ATIVAN) tablet 1 mg (1 mg Oral Given 04/30/23 1848)  ondansetron (ZOFRAN-ODT) disintegrating tablet 8 mg (8 mg Oral Given 04/30/23 1855)    ED Course/ Medical Decision Making/ A&P Clinical Course as of 04/30/23 2047  Sat Apr 30, 2023  1856 Attempted to ambulate patient around the department after treatment.  Patient states she was very dizzy and fell like she was going to lose her balance patient also mention that she is now having a headache.  Treated with headache cocktail.  Ordered MRI. [JR]  1928 BMI (Calculated): 49.74 [JR]  2028 S- CP, dizziness since 8 am. Chest pain reassuring, dizziness has been persistent but sounds peripheral. Received headache and dizziness meds -Fu MRI, can be discharged if negative [HJ]    Clinical Course User Index [HJ] Janyth Pupa, MD [JR] Gareth Eagle, PA-C                                 Medical Decision Making Amount and/or Complexity of Data Reviewed Labs: ordered. Radiology: ordered.  Risk OTC drugs. Prescription drug management.   57 year old well-appearing female presenting for chest pain and dizziness.  Exam was unremarkable.  DDx includes ACS, stroke, PE, electrolyte derangement, other.  Overall workup is reassuring and does not suggest ACS or PE. Did attempt to ambulate patient after treatment and continued to endorse vertiginous symptoms.  Given associated symptoms of room spinning sensation and positional component of her  vertigo suspect it is peripheral but nevertheless feel is warranted to evaluate further with MRI at this time.  MRI is pending.  Signed out patient to Dr. Jean Rosenthal.  If MRI is unremarkable, plan to discharge with PCP follow-up.        Final Clinical Impression(s) / ED Diagnoses Final diagnoses:  Chest pain, unspecified type  Dizziness    Rx / DC Orders ED Discharge Orders     None  Gareth Eagle, PA-C 04/30/23 4742    Derwood Kaplan, MD 05/01/23 1225

## 2023-04-30 NOTE — ED Provider Notes (Signed)
  Physical Exam  BP 119/77   Pulse (!) 54   Temp 97.7 F (36.5 C) (Oral)   Resp 18   Ht 5\' 2"  (1.575 m)   Wt 123.4 kg   SpO2 98%   BMI 49.75 kg/m   Physical Exam  Procedures  Procedures  ED Course / MDM   Clinical Course as of 05/01/23 0001  Sat Apr 30, 2023  1856 Attempted to ambulate patient around the department after treatment.  Patient states she was very dizzy and fell like she was going to lose her balance patient also mention that she is now having a headache.  Treated with headache cocktail.  Ordered MRI. [JR]  1928 BMI (Calculated): 49.74 [JR]  2028 S- CP, dizziness since 8 am. Chest pain reassuring, dizziness has been persistent but sounds peripheral. Received headache and dizziness meds -Fu MRI, can be discharged if negative [HJ]    Clinical Course User Index [HJ] Janyth Pupa, MD [JR] Gareth Eagle, PA-C   Medical Decision Making Amount and/or Complexity of Data Reviewed Labs: ordered. Radiology: ordered.  Risk OTC drugs. Prescription drug management.   Care assumed from off going provider.  He reported he was concerned her symptoms may be related to BPPV.  Reported that he ordered an MRI and would like me to follow-up on the results of this.  MRI obtained, which showed no acute intracranial abnormality and findings that may be related to possible idiopathic intracranial hypertension.  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  Patient was reevaluated and reports she feels mostly better and thinks the medications administered have helped.  She reports the results of her laboratory evaluations were discussed with her and has no questions about those results.  I discussed results of her MRI with her, she voiced understanding.  We discussed using meclizine for symptoms and following up with her PCP for further workup.  Return precautions discussed, patient voiced understanding.  She reports she feels comfortable going home and is looking forward to  resting since she has been in the ED for a long time.  Patient was discharged in stable condition.       Janyth Pupa, MD 05/01/23 0001    Charlynne Pander, MD 05/03/23 2038

## 2023-04-30 NOTE — Discharge Instructions (Addendum)
You were seen here today for chest pain and dizziness.  Your workup showed normal electrolytes, cell counts, heart enzymes or troponins.  Your MRI showed no problem with your brain.  Please use medication as prescribed.  Please follow-up with your PCP.  Please return to the emergency department for chest pain, shortness of breath, severe vomiting or inability to keep down food, loss of consciousness, or any worsening symptom or concern.

## 2023-04-30 NOTE — ED Notes (Signed)
Attempted blood draw. Unable to get blood

## 2023-04-30 NOTE — ED Triage Notes (Signed)
Patient with constant chest pain and fluttering in chest with associated dizziness x 2 hours. 324 ASA given by EMS with some improvement.

## 2023-04-30 NOTE — ED Provider Triage Note (Signed)
Emergency Medicine Provider Triage Evaluation Note  [name] [name] , a 57 y.o. female  was evaluated in triage.  Pt complains of chest pain.  Started this morning around 8 AM.  It is midsternal.  It is worse with exertion.  Associates shortness of breath as well.  Also complaining of dizziness that is worse with standing.  Endorses room spinning.  Had recent cardiac CT.  Review of Systems  Positive: See above Negative: See above  Physical Exam  BP (!) 136/109 (BP Location: Right Arm)   Pulse 65   Temp 97.7 F (36.5 C)   Resp 16   Ht 5\' 2"  (1.575 m)   Wt 123.4 kg   SpO2 100%   BMI 49.75 kg/m  Gen:   Awake, no distress   Resp:  Normal effort  MSK:   Moves extremities without difficulty  Other:    Medical Decision Making  Medically screening exam initiated at 12:16 PM.  Appropriate orders placed.  [name] [name] was informed that the remainder of the evaluation will be completed by another provider, this initial triage assessment does not replace that evaluation, and the importance of remaining in the ED until their evaluation is complete.  Work up started   [name] Eagle, PA-C 04/30/23 1217

## 2023-04-30 NOTE — Telephone Encounter (Signed)
Patient called in reporting she was down in the ER with complaints of chest tightness and fluttering.  She underwent outpatient coronary CTA yesterday, results pending.  Questioning whether she will be admitted to the hospital for further evaluation.  I advised that she would need to be seen by ER provider and further recommendations will be given based off of her workup.  She voiced understanding.

## 2023-05-01 ENCOUNTER — Encounter: Payer: Self-pay | Admitting: Cardiology

## 2023-05-02 ENCOUNTER — Telehealth: Payer: Self-pay | Admitting: Cardiology

## 2023-05-02 ENCOUNTER — Telehealth: Payer: Self-pay

## 2023-05-02 ENCOUNTER — Other Ambulatory Visit: Payer: Self-pay

## 2023-05-02 ENCOUNTER — Encounter: Payer: Self-pay | Admitting: Cardiology

## 2023-05-02 DIAGNOSIS — Z79899 Other long term (current) drug therapy: Secondary | ICD-10-CM

## 2023-05-02 DIAGNOSIS — E7800 Pure hypercholesterolemia, unspecified: Secondary | ICD-10-CM

## 2023-05-02 NOTE — Telephone Encounter (Signed)
Patient is calling to follow up on CT Morph results. Please advise.

## 2023-05-02 NOTE — Telephone Encounter (Signed)
-----   Message from Armanda Magic sent at 05/01/2023 12:36 PM EST ----- Coronary CTA showed coronary Ca score of 289 with <25% stenosis of the prox to mid LAD and prox LCx. Continue ASA and statin.  Please have her come in for FLP and ALT.  CP is noncardiac.

## 2023-05-02 NOTE — Telephone Encounter (Signed)
Call to patient to explain Coronary CTA showed coronary Ca score of 289 with <25% stenosis of the prox to mid LAD and prox LCx. Patient verbalizes understanding that chest pain is noncardiac. Dr. Mayford Knife advises to continue ASA and statin, patient agrees to plan and agrees to . Repeat FLP and AL tomorrow 05/03/23.T. Orders placed and released.

## 2023-05-04 LAB — LIPID PANEL
Chol/HDL Ratio: 3.7 {ratio} (ref 0.0–4.4)
Cholesterol, Total: 206 mg/dL — ABNORMAL HIGH (ref 100–199)
HDL: 56 mg/dL (ref >39)
LDL Chol Calc (NIH): 130 mg/dL — ABNORMAL HIGH (ref 0–99)
Triglycerides: 110 mg/dL (ref 0–149)
VLDL Cholesterol Cal: 20 mg/dL (ref 5–40)

## 2023-05-04 LAB — ALT: ALT: 14 [IU]/L (ref 0–32)

## 2023-05-10 ENCOUNTER — Other Ambulatory Visit: Payer: Self-pay | Admitting: Pharmacist

## 2023-05-10 DIAGNOSIS — Z79899 Other long term (current) drug therapy: Secondary | ICD-10-CM

## 2023-05-10 DIAGNOSIS — M0579 Rheumatoid arthritis with rheumatoid factor of multiple sites without organ or systems involvement: Secondary | ICD-10-CM

## 2023-05-10 NOTE — Progress Notes (Signed)
Next infusion scheduled for Orencia IV ([id number]) on 05/11/23 and due for updated orders. Diagnosis: RA  Dose: 1000MG  every 4 weeks (appropriate based on last recorded weight of 123.4kg)  Last Clinic Visit: 01/19/23 Next Clinic Visit: 06/29/23  Last infusion: 04/13/2023  Labs: CBC and BMP on 04/30/23 TB Gold: 09/20/2022   Orders placed for Orencia IV ([id number]) x 3 doses along with premedication of acetaminophen and diphenhydramine to be administered 30 minutes before medication infusion.  Standing CBC with diff/platelet and CMP with GFR orders placed to be drawn every 2 months.  Next TB gold due 09/20/2023  Chesley Mires, PharmD, MPH, BCPS, CPP Clinical Pharmacist (Rheumatology and Pulmonology)

## 2023-05-11 ENCOUNTER — Encounter (HOSPITAL_COMMUNITY): Payer: BC Managed Care – PPO

## 2023-05-11 ENCOUNTER — Ambulatory Visit (HOSPITAL_COMMUNITY)
Admission: RE | Admit: 2023-05-11 | Discharge: 2023-05-11 | Disposition: A | Discharge status: Home / Self Care | Payer: BC Managed Care – PPO | Source: Ambulatory Visit | Attending: Rheumatology | Admitting: Rheumatology

## 2023-05-11 DIAGNOSIS — M0579 Rheumatoid arthritis with rheumatoid factor of multiple sites without organ or systems involvement: Secondary | ICD-10-CM | POA: Insufficient documentation

## 2023-05-11 DIAGNOSIS — Z79899 Other long term (current) drug therapy: Secondary | ICD-10-CM | POA: Diagnosis present

## 2023-05-11 MED ORDER — DIPHENHYDRAMINE HCL 25 MG PO CAPS
25.0000 mg | ORAL_CAPSULE | ORAL | Status: DC
Start: 2023-05-11 — End: 2023-08-03

## 2023-05-11 MED ORDER — ABATACEPT 250 MG IV SOLR
1000.0000 mg | INTRAVENOUS | Status: DC
  Administered 2023-05-11: 1000 mg via INTRAVENOUS
  Filled 2023-05-11: qty 40

## 2023-05-11 MED ORDER — ACETAMINOPHEN 325 MG PO TABS
650.0000 mg | ORAL_TABLET | ORAL | Status: DC
Start: 2023-05-11 — End: 2023-08-03

## 2023-05-11 MED ORDER — DIPHENHYDRAMINE HCL 25 MG PO CAPS
ORAL_CAPSULE | ORAL | Status: AC
  Administered 2023-05-11: 25 mg via ORAL
  Filled 2023-05-11: qty 1

## 2023-05-11 MED ORDER — ACETAMINOPHEN 325 MG PO TABS
ORAL_TABLET | ORAL | Status: AC
  Administered 2023-05-11: 650 mg via ORAL
  Filled 2023-05-11: qty 2

## 2023-05-20 NOTE — Telephone Encounter (Signed)
Called patient and she confirms that she anticipates no insurance changes in 2025.  Current IV Orencia pre-certification expires on 07/26/2023 but it's under Sherron Ales, PA-C  Pre-certification re-initiated under Dr. Fatima Sanger name for start date of 06/08/2023. Clinicals are not required at this time per rep.  Phone: [phone] Fax: [phone] Pending case # [id number]  Chesley Mires, PharmD, MPH, BCPS, CPP Clinical Pharmacist (Rheumatology and Pulmonology)

## 2023-05-20 NOTE — Telephone Encounter (Signed)
Received fax from [facility]. IV Dub Amis has been approved from 06/08/2023 through 06/07/2024.  Ref # [id number]  Chesley Mires, PharmD, MPH, BCPS, CPP Clinical Pharmacist (Rheumatology and Pulmonology)

## 2023-06-02 ENCOUNTER — Other Ambulatory Visit: Payer: Self-pay

## 2023-06-02 ENCOUNTER — Other Ambulatory Visit: Payer: Self-pay | Admitting: Rheumatology

## 2023-06-02 ENCOUNTER — Telehealth: Payer: Self-pay

## 2023-06-02 DIAGNOSIS — Z79899 Other long term (current) drug therapy: Secondary | ICD-10-CM

## 2023-06-02 DIAGNOSIS — E7800 Pure hypercholesterolemia, unspecified: Secondary | ICD-10-CM

## 2023-06-02 NOTE — Telephone Encounter (Signed)
Last Fill: 02/22/2023  Labs: 04/13/2023 CBC and CMP are normal.   Next Visit: 06/29/2023  Last Visit: 01/19/2023  DX: Rheumatoid arthritis involving multiple sites with positive rheumatoid factor   Current Dose per office note 01/19/2023: Rasuvo 25 mg per 0.5 mL subcutaneous weekly   Okay to refill Rasuvo?

## 2023-06-02 NOTE — Telephone Encounter (Signed)
Lipid labs ordered for jan 14, 6 weeks after patient restarted cholesterol lowering drugs.

## 2023-06-09 ENCOUNTER — Ambulatory Visit (HOSPITAL_COMMUNITY)
Admission: RE | Admit: 2023-06-09 | Discharge: 2023-06-09 | Disposition: A | Discharge status: Home / Self Care | Payer: BC Managed Care – PPO | Source: Ambulatory Visit | Attending: Rheumatology | Admitting: Rheumatology

## 2023-06-09 DIAGNOSIS — M0579 Rheumatoid arthritis with rheumatoid factor of multiple sites without organ or systems involvement: Secondary | ICD-10-CM | POA: Diagnosis present

## 2023-06-09 DIAGNOSIS — Z79899 Other long term (current) drug therapy: Secondary | ICD-10-CM

## 2023-06-09 LAB — CBC WITH DIFFERENTIAL/PLATELET
Abs Immature Granulocytes: 0.02 10*3/uL (ref 0.00–0.07)
Basophils Absolute: 0.1 10*3/uL (ref 0.0–0.1)
Basophils Relative: 1 %
Eosinophils Absolute: 0.2 10*3/uL (ref 0.0–0.5)
Eosinophils Relative: 3 %
HCT: 46.2 % — ABNORMAL HIGH (ref 36.0–46.0)
Hemoglobin: 14.3 g/dL (ref 12.0–15.0)
Immature Granulocytes: 0 %
Lymphocytes Relative: 51 %
Lymphs Abs: 3.8 10*3/uL (ref 0.7–4.0)
MCH: 26.9 pg (ref 26.0–34.0)
MCHC: 31 g/dL (ref 30.0–36.0)
MCV: 86.8 fL (ref 80.0–100.0)
Monocytes Absolute: 0.5 10*3/uL (ref 0.1–1.0)
Monocytes Relative: 6 %
Neutro Abs: 3 10*3/uL (ref 1.7–7.7)
Neutrophils Relative %: 39 %
Platelets: 268 10*3/uL (ref 150–400)
RBC: 5.32 MIL/uL — ABNORMAL HIGH (ref 3.87–5.11)
RDW: 13.3 % (ref 11.5–15.5)
WBC: 7.6 10*3/uL (ref 4.0–10.5)
nRBC: 0 % (ref 0.0–0.2)

## 2023-06-09 MED ORDER — DIPHENHYDRAMINE HCL 25 MG PO CAPS
ORAL_CAPSULE | ORAL | Status: AC
  Filled 2023-06-09: qty 1

## 2023-06-09 MED ORDER — SODIUM CHLORIDE 0.9 % IV SOLN
1000.0000 mg | INTRAVENOUS | Status: DC
  Administered 2023-06-09: 1000 mg via INTRAVENOUS
  Filled 2023-06-09: qty 40

## 2023-06-09 MED ORDER — ACETAMINOPHEN 325 MG PO TABS
650.0000 mg | ORAL_TABLET | ORAL | Status: DC
Start: 2023-06-09 — End: 2023-06-10
  Administered 2023-06-09: 650 mg via ORAL

## 2023-06-09 MED ORDER — ACETAMINOPHEN 325 MG PO TABS
ORAL_TABLET | ORAL | Status: AC
  Filled 2023-06-09: qty 2

## 2023-06-09 MED ORDER — DIPHENHYDRAMINE HCL 25 MG PO CAPS
25.0000 mg | ORAL_CAPSULE | ORAL | Status: DC
Start: 2023-06-09 — End: 2023-06-10
  Administered 2023-06-09: 25 mg via ORAL

## 2023-06-09 NOTE — Progress Notes (Signed)
 RBC count remains elevated-stable. Hematocrit is borderline elevated. Rest of CBC WNL.

## 2023-06-15 LAB — LIPID PANEL
Chol/HDL Ratio: 2.1 {ratio} (ref 0.0–4.4)
Cholesterol, Total: 140 mg/dL (ref 100–199)
HDL: 68 mg/dL (ref >39)
LDL Chol Calc (NIH): 57 mg/dL (ref 0–99)
Triglycerides: 75 mg/dL (ref 0–149)
VLDL Cholesterol Cal: 15 mg/dL (ref 5–40)

## 2023-06-15 LAB — ALT: ALT: 20 [IU]/L (ref 0–32)

## 2023-06-15 NOTE — Progress Notes (Deleted)
Office Visit Note  Patient: [name] [name]             Date of Birth: 1965-07-23           MRN: [id number]             PCP: [name], MD Referring: [name], MD Visit Date: 06/29/2023 Occupation: @GUAROCC @  Subjective:  No chief complaint on file.   History of Present Illness: [name] [name] is a 58 y.o. female ***     Activities of Daily Living:  Patient reports morning stiffness for *** {minute/hour:19697}.   Patient {ACTIONS;DENIES/REPORTS:21021675::"Denies"} nocturnal pain.  Difficulty dressing/grooming: {ACTIONS;DENIES/REPORTS:21021675::"Denies"} Difficulty climbing stairs: {ACTIONS;DENIES/REPORTS:21021675::"Denies"} Difficulty getting out of chair: {ACTIONS;DENIES/REPORTS:21021675::"Denies"} Difficulty using hands for taps, buttons, cutlery, and/or writing: {ACTIONS;DENIES/REPORTS:21021675::"Denies"}  No Rheumatology ROS completed.   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   Dyslipidemia 01/30/2015   MDD (major depressive disorder), recurrent severe, 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   Essential hypertension 08/25/2014   Morbid obesity (HCC) 08/25/2014   CAD (coronary artery disease), native coronary artery 08/24/2014   Numbness and tingling in left arm    Arm numbness left 08/22/2014   Chest pain 08/22/2014   Gastroesophageal reflux disease without esophagitis 07/23/2014    Coronary artery calcification seen on CAT scan 02/19/2014   Hip pain 10/23/2013   Solitary pulmonary nodule 08/28/2013   Chest pain, atypical 07/18/2013   Heart palpitations 06/27/2013   Cough 06/04/2013   Diastolic dysfunction 05/18/2013   SOB (shortness of breath) 05/09/2013   Other malaise and fatigue 01/21/2013   Stress and adjustment reaction 12/09/2012   Right sided abdominal pain 11/29/2012   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/21/2012   Morbid obesity with BMI of 50.0-59.9, adult (HCC) 11/21/2012   Arthritis 07/13/2012   Depression 07/13/2012   Routine health maintenance 05/07/2012   Migraine 06/07/1998    Past Medical History:  Diagnosis Date   Anxiety    Bursitis    CAD (coronary artery disease), native coronary artery 04/2023   coronary Ca score of 289 with <25% stenosis of the prox to mid LAD and prox LCx.   Depression    Diastolic dysfunction    Dyslipidemia 01/30/2015   Esophageal ring    Fibromyalgia    Hiatal hernia    HSV-1 infection    Morbid obesity (HCC)    Osteoarthritis    Rheumatoid arthritis(714.0)    M05.79   Rheumatoid arthritis, seropositive, 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HCC)    Treated with Orencia, TB neg 09/12/2015   Spondylolysis     Family History  Problem Relation Age of Onset   Hyperlipidemia Mother    Depression Mother    Sarcoidosis Father    Lung disease Father        Pleural Mesothelioma   Cancer Father    Heart attack Paternal Grandmother    Hypertension Paternal Grandmother    Hypertension Maternal Grandmother    Diabetes Maternal Grandmother  Stroke Neg Hx    Colon cancer Neg Hx    Esophageal cancer Neg Hx    Rectal cancer Neg Hx    Stomach cancer Neg Hx    Past Surgical History:  Procedure Laterality Date   CARDIAC CATHETERIZATION N/A 06/11/2016   Procedure: Left Heart Cath and Coronary Angiography;  Surgeon: Corky Crafts, MD;  Location: [facility] INVASIVE CV LAB;  Service: Cardiovascular;  Laterality: N/A;    CESAREAN SECTION     COLONOSCOPY  07/27/2017   ESOPHAGEAL MANOMETRY N/A 10/28/2014   Procedure: ESOPHAGEAL MANOMETRY (EM);  Surgeon: Hilarie Fredrickson, MD;  Location: [facility] ENDOSCOPY;  Service: Endoscopy;  Laterality: N/A;   HERNIA REPAIR     reports surgery on 3 hernias, with 2 more present   LAPAROSCOPIC GASTRIC SLEEVE RESECTION  11/21/2012   [facility]   LEFT HEART CATHETERIZATION WITH CORONARY ANGIOGRAM N/A 08/26/2014   Procedure: LEFT HEART CATHETERIZATION WITH CORONARY ANGIOGRAM;  Surgeon: Runell Gess, MD;  Location: [facility] CATH LAB;  Service: Cardiovascular;  Laterality: N/A;   SHOULDER SURGERY Left 10/04/2022   TUBAL LIGATION     Social History   Social History Narrative   Marital Status: Married Probation officer)    Children:  Son [name]) Daughter [name])    Pets: None    Living Situation: Lives with husband and children    Occupation: Occupational psychologist Administrator)     Education: Oncologist (Psychology)     Tobacco Use/Exposure:  None    Alcohol Use:  Occasional   Drug Use:  None   Diet:  Regular   Exercise: Walking or Treadmill (2 x week)   Hobbies: Clinical cytogeneticist, [employer]ations.               Immunization History  Administered Date(s) Administered   PFIZER(Purple Top)SARS-COV-2 Vaccination 08/30/2019, 09/20/2019, 06/05/2020   Tdap 09/21/2016     Objective: Vital Signs: There were no vitals taken for this visit.   Physical Exam   Musculoskeletal Exam: ***  CDAI Exam: CDAI Score: -- Patient Global: --; Provider Global: -- Swollen: --; Tender: -- Joint Exam 06/29/2023   No joint exam has been documented for this visit   There is currently no information documented on the homunculus. Go to the Rheumatology activity and complete the homunculus joint exam.  Investigation: No additional findings.  Imaging: No results found.  Recent Labs: Lab Results  Component Value Date   WBC 7.6 06/09/2023   HGB 14.3 06/09/2023   PLT 268  06/09/2023   NA 141 04/30/2023   K 3.5 04/30/2023   CL 108 04/30/2023   CO2 24 04/30/2023   GLUCOSE 80 04/30/2023   BUN 11 04/30/2023   CREATININE 0.81 04/30/2023   BILITOT 1.0 04/13/2023   ALKPHOS 62 04/13/2023   AST 40 04/13/2023   ALT 14 05/03/2023   PROT 6.7 04/13/2023   ALBUMIN 3.5 04/13/2023   CALCIUM 9.2 04/30/2023   GFRAA >60 12/26/2019   QFTBGOLD Negative 09/01/2016   QFTBGOLDPLUS Negative 09/20/2022    Speciality Comments: ORENCIA 1000 mg x 4 weeks  Procedures:  No procedures performed Allergies: Influenza vaccines, Influenza virus vaccine, Amitriptyline, and Isosorbide   Assessment / Plan:     Visit Diagnoses: Rheumatoid arthritis involving multiple sites with positive rheumatoid factor (HCC)  High risk medication use  Primary osteoarthritis of both knees  Trochanteric bursitis of both hips  Fibromyalgia  Other fatigue  Other insomnia  History of hypertension  Dyslipidemia  History of coronary artery disease  Solitary pulmonary nodule  History of diastolic dysfunction  History of depression  S/P laparoscopic sleeve gastrectomy  History of vitamin D deficiency  Orders: No orders of the defined types were placed in this encounter.  No orders of the defined types were placed in this encounter.   Face-to-face time spent with patient was *** minutes. Greater than 50% of time was spent in counseling and coordination of care.  Follow-Up Instructions: No follow-ups on file.   Gearldine Bienenstock, PA-C  Note - This record has been created using Dragon software.  Chart creation errors have been sought, but may not always  have been located. Such creation errors do not reflect on  the standard of medical care.

## 2023-06-20 ENCOUNTER — Telehealth: Payer: Self-pay

## 2023-06-20 ENCOUNTER — Ambulatory Visit: Payer: BC Managed Care – PPO | Attending: Cardiology

## 2023-06-20 DIAGNOSIS — R002 Palpitations: Secondary | ICD-10-CM

## 2023-06-20 NOTE — Telephone Encounter (Signed)
 Zio ordered per Dr. Mayford Knife. Patient notified via MyChart.

## 2023-06-20 NOTE — Progress Notes (Unsigned)
 Enrolled for [employer] to mail a ZIO XT long term holter monitor to the patients address on file.

## 2023-06-23 ENCOUNTER — Telehealth: Payer: Self-pay | Admitting: Pharmacist

## 2023-06-23 NOTE — Telephone Encounter (Signed)
Patient left VM asking if she is still eligible for Orencia copay assistance. Advised patient that the funds renew annually. If she wants to confirm active enrollment in copay assistance, she may call 1-800-ORENCIA  Chesley Mires, PharmD, MPH, BCPS, CPP Clinical Pharmacist (Rheumatology and Pulmonology)

## 2023-06-29 ENCOUNTER — Ambulatory Visit: Payer: BC Managed Care – PPO | Admitting: Rheumatology

## 2023-06-29 DIAGNOSIS — G8929 Other chronic pain: Secondary | ICD-10-CM

## 2023-06-29 DIAGNOSIS — Z8659 Personal history of other mental and behavioral disorders: Secondary | ICD-10-CM

## 2023-06-29 DIAGNOSIS — Z9884 Bariatric surgery status: Secondary | ICD-10-CM

## 2023-06-29 DIAGNOSIS — R911 Solitary pulmonary nodule: Secondary | ICD-10-CM

## 2023-06-29 DIAGNOSIS — Z8679 Personal history of other diseases of the circulatory system: Secondary | ICD-10-CM

## 2023-06-29 DIAGNOSIS — G4709 Other insomnia: Secondary | ICD-10-CM

## 2023-06-29 DIAGNOSIS — M0579 Rheumatoid arthritis with rheumatoid factor of multiple sites without organ or systems involvement: Secondary | ICD-10-CM

## 2023-06-29 DIAGNOSIS — E785 Hyperlipidemia, unspecified: Secondary | ICD-10-CM

## 2023-06-29 DIAGNOSIS — Z79899 Other long term (current) drug therapy: Secondary | ICD-10-CM

## 2023-06-29 DIAGNOSIS — R5383 Other fatigue: Secondary | ICD-10-CM

## 2023-06-29 DIAGNOSIS — M170 Bilateral primary osteoarthritis of knee: Secondary | ICD-10-CM

## 2023-06-29 DIAGNOSIS — Z8639 Personal history of other endocrine, nutritional and metabolic disease: Secondary | ICD-10-CM

## 2023-06-29 DIAGNOSIS — M7061 Trochanteric bursitis, right hip: Secondary | ICD-10-CM

## 2023-06-29 DIAGNOSIS — M797 Fibromyalgia: Secondary | ICD-10-CM

## 2023-07-07 ENCOUNTER — Inpatient Hospital Stay (HOSPITAL_COMMUNITY): Admission: RE | Admit: 2023-07-07 | Payer: BC Managed Care – PPO | Source: Ambulatory Visit

## 2023-07-10 ENCOUNTER — Other Ambulatory Visit: Payer: Self-pay | Admitting: Cardiology

## 2023-07-18 ENCOUNTER — Other Ambulatory Visit: Payer: Self-pay | Admitting: Pharmacist

## 2023-07-18 ENCOUNTER — Ambulatory Visit (HOSPITAL_COMMUNITY)
Admission: RE | Admit: 2023-07-18 | Discharge: 2023-07-18 | Disposition: A | Discharge status: Home / Self Care | Payer: BC Managed Care – PPO | Source: Ambulatory Visit | Attending: Rheumatology | Admitting: Rheumatology

## 2023-07-18 VITALS — BP 121/83 | HR 71 | Temp 97.8°F | Resp 18 | Wt 271.0 lb

## 2023-07-18 DIAGNOSIS — Z79899 Other long term (current) drug therapy: Secondary | ICD-10-CM | POA: Diagnosis present

## 2023-07-18 DIAGNOSIS — M0579 Rheumatoid arthritis with rheumatoid factor of multiple sites without organ or systems involvement: Secondary | ICD-10-CM

## 2023-07-18 DIAGNOSIS — M1990 Unspecified osteoarthritis, unspecified site: Secondary | ICD-10-CM | POA: Insufficient documentation

## 2023-07-18 LAB — COMPREHENSIVE METABOLIC PANEL
ALT: 18 U/L (ref 0–44)
AST: 22 U/L (ref 15–41)
Albumin: 3.2 g/dL — ABNORMAL LOW (ref 3.5–5.0)
Alkaline Phosphatase: 67 U/L (ref 38–126)
Anion gap: 7 (ref 5–15)
BUN: 9 mg/dL (ref 6–20)
CO2: 26 mmol/L (ref 22–32)
Calcium: 8.9 mg/dL (ref 8.9–10.3)
Chloride: 111 mmol/L (ref 98–111)
Creatinine, Ser: 1.13 mg/dL — ABNORMAL HIGH (ref 0.44–1.00)
GFR, Estimated: 57 mL/min — ABNORMAL LOW (ref >60)
Glucose, Bld: 87 mg/dL (ref 70–99)
Potassium: 4.1 mmol/L (ref 3.5–5.1)
Sodium: 144 mmol/L (ref 135–145)
Total Bilirubin: 0.6 mg/dL (ref 0.0–1.2)
Total Protein: 6.4 g/dL — ABNORMAL LOW (ref 6.5–8.1)

## 2023-07-18 MED ORDER — DIPHENHYDRAMINE HCL 25 MG PO CAPS
ORAL_CAPSULE | ORAL | Status: AC
  Filled 2023-07-18: qty 1

## 2023-07-18 MED ORDER — DIPHENHYDRAMINE HCL 25 MG PO CAPS
25.0000 mg | ORAL_CAPSULE | Freq: Once | ORAL | Status: AC
  Administered 2023-07-18: 25 mg via ORAL

## 2023-07-18 MED ORDER — ACETAMINOPHEN 325 MG PO TABS
650.0000 mg | ORAL_TABLET | Freq: Once | ORAL | Status: AC
  Administered 2023-07-18: 650 mg via ORAL

## 2023-07-18 MED ORDER — SODIUM CHLORIDE 0.9 % IV SOLN
1000.0000 mg | INTRAVENOUS | Status: DC
Start: 2023-07-18 — End: 2023-09-12
  Administered 2023-07-18: 1000 mg via INTRAVENOUS
  Filled 2023-07-18: qty 40

## 2023-07-18 MED ORDER — ACETAMINOPHEN 325 MG PO TABS
ORAL_TABLET | ORAL | Status: AC
  Filled 2023-07-18: qty 2

## 2023-07-18 NOTE — Progress Notes (Signed)
 Next infusion scheduled for Orencia IV (Z6109) on 08/17/23 and due for updated orders. Diagnosis: RA  Dose: 1000mg  every 4 weeks (appropriate based on last recorded weight of 122.9kg)  Last Clinic Visit: 01/19/2023 Next Clinic Visit: 08/17/23  Last infusion: 07/18/2023  Labs: CMP on 07/18/2023, CBC on 06/09/2023 TB Gold: negative on 09/20/2022   Orders placed for Orencia IV (J0129) x 1 dose along with premedication of acetaminophen and diphenhydramine to be administered 30 minutes before medication infusion. Patient must show to appt to have further Orencia orders placed.  Standing CBC with diff/platelet and CMP with GFR orders placed to be drawn every 2 months.  Next TB gold due 09/20/2023  Chesley Mires, PharmD, MPH, BCPS, CPP Clinical Pharmacist (Rheumatology and Pulmonology)

## 2023-07-19 NOTE — Progress Notes (Signed)
She should avoid the use of NSAIDs

## 2023-07-27 NOTE — Progress Notes (Deleted)
 Office Visit Note  Patient: [name] [name]             Date of Birth: March 16, 1966           MRN: [id number]             PCP: [name], MD Referring: [name], MD Visit Date: 08/10/2023 Occupation: @GUAROCC @  Subjective:  No chief complaint on file.   History of Present Illness: [name] [name] is a 58 y.o. female ***     Activities of Daily Living:  Patient reports morning stiffness for *** {minute/hour:19697}.   Patient {ACTIONS;DENIES/REPORTS:21021675::"Denies"} nocturnal pain.  Difficulty dressing/grooming: {ACTIONS;DENIES/REPORTS:21021675::"Denies"} Difficulty climbing stairs: {ACTIONS;DENIES/REPORTS:21021675::"Denies"} Difficulty getting out of chair: {ACTIONS;DENIES/REPORTS:21021675::"Denies"} Difficulty using hands for taps, buttons, cutlery, and/or writing: {ACTIONS;DENIES/REPORTS:21021675::"Denies"}  No Rheumatology ROS completed.   PMFS History:  Patient Active Problem List   Diagnosis Date Noted  . Amenorrhea 06/09/2020  . Anxiety 02/20/2018  . Fatigue 02/20/2018  . Low blood potassium 02/20/2018  . Allergic rhinitis 12/14/2017  . Fever blister 12/14/2017  . Head congestion 12/14/2017  . Hyperlipidemia 12/14/2017  . Lateral epicondylitis of right elbow 05/04/2017  . Sacral pain 03/23/2017  . High risk medication use 08/03/2016  . Fibromyalgia syndrome 08/03/2016  . Abnormal cardiac CT angiography   . Sacrococcygeal pain 02/20/2016  . Rheumatoid arthritis involving 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HCC) 01/24/2016  . 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 Dyslipidemia 01/30/2015  . 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 Essential hypertension 08/25/2014  . Morbid obesity (HCC) 08/25/2014  . CAD (coronary artery disease), native coronary artery 08/24/2014  . Numbness and tingling in left arm   . Arm numbness left 08/22/2014  . Chest pain 08/22/2014  . Gastroesophageal reflux disease without  esophagitis 07/23/2014  . Coronary artery calcification seen on CAT scan 02/19/2014  . Hip pain 10/23/2013  . Solitary pulmonary nodule 08/28/2013  . Chest pain, atypical 07/18/2013  . Heart palpitations 06/27/2013  . Cough 06/04/2013  . Diastolic dysfunction 05/18/2013  . SOB (shortness of breath) 05/09/2013  . Other malaise and fatigue 01/21/2013  . Stress and adjustment reaction 12/09/2012  . Right sided abdominal pain 11/29/2012  . 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/21/2012  . Morbid obesity with BMI of 50.0-59.9, adult (HCC) 11/21/2012  . Arthritis 07/13/2012  . Depression 07/13/2012  . Routine health maintenance 05/07/2012  . Migraine 06/07/1998    Past Medical History:  Diagnosis Date  . Anxiety   . Bursitis   . CAD (coronary artery disease), native coronary artery 04/2023   coronary Ca score of 289 with <25% stenosis of the prox to mid LAD and prox LCx.  . Depression   . Diastolic dysfunction   . Dyslipidemia 01/30/2015  . Esophageal ring   . Fibromyalgia   . Hiatal hernia   . HSV-1 infection   . Morbid obesity (HCC)   . Osteoarthritis   . Rheumatoid arthritis(714.0)    M05.79  . Rheumatoid arthritis, seropositive, 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HCC)    Treated with Orencia, TB neg 09/12/2015  . Spondylolysis     Family History  Problem Relation Age of Onset  . Hyperlipidemia Mother   . Depression Mother   . Sarcoidosis Father   . Lung disease Father        Pleural Mesothelioma  . Cancer Father   . Heart attack Paternal Grandmother   . Hypertension Paternal Grandmother   . Hypertension Maternal Grandmother   . Diabetes Maternal Grandmother   .  Stroke Neg Hx   . Colon cancer Neg Hx   . Esophageal cancer Neg Hx   . Rectal cancer Neg Hx   . Stomach cancer Neg Hx    Past Surgical History:  Procedure Laterality Date  . CARDIAC CATHETERIZATION N/A 06/11/2016   Procedure: Left Heart Cath and Coronary Angiography;  Surgeon: Corky Crafts, MD;  Location:  [facility] INVASIVE CV LAB;  Service: Cardiovascular;  Laterality: N/A;  . CESAREAN SECTION    . COLONOSCOPY  07/27/2017  . ESOPHAGEAL MANOMETRY N/A 10/28/2014   Procedure: ESOPHAGEAL MANOMETRY (EM);  Surgeon: Hilarie Fredrickson, MD;  Location: [facility] ENDOSCOPY;  Service: Endoscopy;  Laterality: N/A;  . HERNIA REPAIR     reports surgery on 3 hernias, with 2 more present  . LAPAROSCOPIC GASTRIC SLEEVE RESECTION  11/21/2012   [facility]  . LEFT HEART CATHETERIZATION WITH CORONARY ANGIOGRAM N/A 08/26/2014   Procedure: LEFT HEART CATHETERIZATION WITH CORONARY ANGIOGRAM;  Surgeon: Runell Gess, MD;  Location: [facility] CATH LAB;  Service: Cardiovascular;  Laterality: N/A;  . SHOULDER SURGERY Left 10/04/2022  . TUBAL LIGATION     Social History   Social History Narrative   Marital Status: Married Probation officer)    Children:  Son [name]) Daughter [name])    Pets: None    Living Situation: Lives with husband and children    Occupation: Occupational psychologist Administrator)     Education: Oncologist (Psychology)     Tobacco Use/Exposure:  None    Alcohol Use:  Occasional   Drug Use:  None   Diet:  Regular   Exercise: Walking or Treadmill (2 x week)   Hobbies: Clinical cytogeneticist, [employer].               Immunization History  Administered Date(s) Administered  . PFIZER(Purple Top)SARS-COV-2 Vaccination 08/30/2019, 09/20/2019, 06/05/2020  . Tdap 09/21/2016     Objective: Vital Signs: There were no vitals taken for this visit.   Physical Exam   Musculoskeletal Exam: ***  CDAI Exam: CDAI Score: -- Patient Global: --; Provider Global: -- Swollen: --; Tender: -- Joint Exam 08/10/2023   No joint exam has been documented for this visit   There is currently no information documented on the homunculus. Go to the Rheumatology activity and complete the homunculus joint exam.  Investigation: No additional findings.  Imaging: No results found.  Recent Labs: Lab Results  Component  Value Date   WBC 7.6 06/09/2023   HGB 14.3 06/09/2023   PLT 268 06/09/2023   NA 144 07/18/2023   K 4.1 07/18/2023   CL 111 07/18/2023   CO2 26 07/18/2023   GLUCOSE 87 07/18/2023   BUN 9 07/18/2023   CREATININE 1.13 (H) 07/18/2023   BILITOT 0.6 07/18/2023   ALKPHOS 67 07/18/2023   AST 22 07/18/2023   ALT 18 07/18/2023   PROT 6.4 (L) 07/18/2023   ALBUMIN 3.2 (L) 07/18/2023   CALCIUM 8.9 07/18/2023   GFRAA >60 12/26/2019   QFTBGOLD Negative 09/01/2016   QFTBGOLDPLUS Negative 09/20/2022    Speciality Comments: ORENCIA 1000 mg x 4 weeks  Procedures:  No procedures performed Allergies: Influenza vaccines, Influenza virus vaccine, Amitriptyline, and Isosorbide   Assessment / Plan:     Visit Diagnoses: Rheumatoid arthritis involving multiple sites with positive rheumatoid factor (HCC)  High risk medication use  Primary osteoarthritis of both knees  Trochanteric bursitis of both hips  Fibromyalgia  Other fatigue  Other insomnia  History of hypertension  Dyslipidemia  History of coronary  artery disease  Solitary pulmonary nodule  History of diastolic dysfunction  History of depression  S/P laparoscopic sleeve gastrectomy  History of vitamin D deficiency  Orders: No orders of the defined types were placed in this encounter.  No orders of the defined types were placed in this encounter.   Face-to-face time spent with patient was *** minutes. Greater than 50% of time was spent in counseling and coordination of care.  Follow-Up Instructions: No follow-ups on file.   Gearldine Bienenstock, PA-C  Note - This record has been created using Dragon software.  Chart creation errors have been sought, but may not always  have been located. Such creation errors do not reflect on  the standard of medical care.

## 2023-08-04 ENCOUNTER — Encounter (HOSPITAL_COMMUNITY): Payer: BC Managed Care – PPO

## 2023-08-09 NOTE — Progress Notes (Deleted)
 Office Visit Note  Patient: [name] [name]             Date of Birth: [birth year]           MRN: [id number]             PCP: [name], MD Referring: [name], MD Visit Date: 08/10/2023 Occupation: @GUAROCC @  Subjective:  No chief complaint on file.   History of Present Illness: [name] [name] is a 58 y.o. female ***     Activities of Daily Living:  Patient reports morning stiffness for *** {minute/hour:19697}.   Patient {ACTIONS;DENIES/REPORTS:21021675::"Denies"} nocturnal pain.  Difficulty dressing/grooming: {ACTIONS;DENIES/REPORTS:21021675::"Denies"} Difficulty climbing stairs: {ACTIONS;DENIES/REPORTS:21021675::"Denies"} Difficulty getting out of chair: {ACTIONS;DENIES/REPORTS:21021675::"Denies"} Difficulty using hands for taps, buttons, cutlery, and/or writing: {ACTIONS;DENIES/REPORTS:21021675::"Denies"}  No Rheumatology ROS completed.   PMFS History:  Patient Active Problem List   Diagnosis Date Noted  . Amenorrhea 06/09/2020  . Anxiety 02/20/2018  . Fatigue 02/20/2018  . Low blood potassium 02/20/2018  . Allergic rhinitis 12/14/2017  . Fever blister 12/14/2017  . Head congestion 12/14/2017  . Hyperlipidemia 12/14/2017  . Lateral epicondylitis of right elbow 05/04/2017  . Sacral pain 03/23/2017  . High risk medication use 08/03/2016  . Fibromyalgia syndrome 08/03/2016  . Abnormal cardiac CT angiography   . Sacrococcygeal pain 02/20/2016  . Rheumatoid arthritis involving 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HCC) 01/24/2016  . 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 Dyslipidemia 01/30/2015  . 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 Essential hypertension 08/25/2014  . Morbid obesity (HCC) 08/25/2014  . CAD (coronary artery disease), native coronary artery 08/24/2014  . Numbness and tingling in left arm   . Arm numbness left 08/22/2014  . Chest pain 08/22/2014  . Gastroesophageal reflux disease without  esophagitis 07/23/2014  . Coronary artery calcification seen on CAT scan 02/19/2014  . Hip pain 10/23/2013  . Solitary pulmonary nodule 08/28/2013  . Chest pain, atypical 07/18/2013  . Heart palpitations 06/27/2013  . Cough 06/04/2013  . Diastolic dysfunction 05/18/2013  . SOB (shortness of breath) 05/09/2013  . Other malaise and fatigue 01/21/2013  . Stress and adjustment reaction 12/09/2012  . Right sided abdominal pain 11/29/2012  . 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/21/2012  . Morbid obesity with BMI of 50.0-59.9, adult (HCC) 11/21/2012  . Arthritis 07/13/2012  . Depression 07/13/2012  . Routine health maintenance 05/07/2012  . Migraine 06/07/1998    Past Medical History:  Diagnosis Date  . Anxiety   . Bursitis   . CAD (coronary artery disease), native coronary artery 04/2023   coronary Ca score of 289 with <25% stenosis of the prox to mid LAD and prox LCx.  . Depression   . Diastolic dysfunction   . Dyslipidemia 01/30/2015  . Esophageal ring   . Fibromyalgia   . Hiatal hernia   . HSV-1 infection   . Morbid obesity (HCC)   . Osteoarthritis   . Rheumatoid arthritis(714.0)    M05.79  . Rheumatoid arthritis, seropositive, 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HCC)    Treated with Orencia, TB neg 09/12/2015  . Spondylolysis     Family History  Problem Relation Age of Onset  . Hyperlipidemia Mother   . Depression Mother   . Sarcoidosis Father   . Lung disease Father        Pleural Mesothelioma  . Cancer Father   . Heart attack Paternal Grandmother   . Hypertension Paternal Grandmother   . Hypertension Maternal Grandmother   . Diabetes Maternal Grandmother   .  Stroke Neg Hx   . Colon cancer Neg Hx   . Esophageal cancer Neg Hx   . Rectal cancer Neg Hx   . Stomach cancer Neg Hx    Past Surgical History:  Procedure Laterality Date  . CARDIAC CATHETERIZATION N/A 06/11/2016   Procedure: Left Heart Cath and Coronary Angiography;  Surgeon: Corky Crafts, MD;  Location:  [facility] INVASIVE CV LAB;  Service: Cardiovascular;  Laterality: N/A;  . CESAREAN SECTION    . COLONOSCOPY  07/27/2017  . ESOPHAGEAL MANOMETRY N/A 10/28/2014   Procedure: ESOPHAGEAL MANOMETRY (EM);  Surgeon: Hilarie Fredrickson, MD;  Location: [facility] ENDOSCOPY;  Service: Endoscopy;  Laterality: N/A;  . HERNIA REPAIR     reports surgery on 3 hernias, with 2 more present  . LAPAROSCOPIC GASTRIC SLEEVE RESECTION  11/21/2012   [facility]  . LEFT HEART CATHETERIZATION WITH CORONARY ANGIOGRAM N/A 08/26/2014   Procedure: LEFT HEART CATHETERIZATION WITH CORONARY ANGIOGRAM;  Surgeon: Runell Gess, MD;  Location: [facility] CATH LAB;  Service: Cardiovascular;  Laterality: N/A;  . SHOULDER SURGERY Left 10/04/2022  . TUBAL LIGATION     Social History   Social History Narrative   Marital Status: Married Probation officer)    Children:  Son [name]) Daughter [name])    Pets: None    Living Situation: Lives with husband and children    Occupation: Occupational psychologist Administrator)     Education: Oncologist (Psychology)     Tobacco Use/Exposure:  None    Alcohol Use:  Occasional   Drug Use:  None   Diet:  Regular   Exercise: Walking or Treadmill (2 x week)   Hobbies: Clinical cytogeneticist, [employer].               Immunization History  Administered Date(s) Administered  . PFIZER(Purple Top)SARS-COV-2 Vaccination 08/30/2019, 09/20/2019, 06/05/2020  . Tdap 09/21/2016     Objective: Vital Signs: There were no vitals taken for this visit.   Physical Exam   Musculoskeletal Exam: ***  CDAI Exam: CDAI Score: -- Patient Global: --; Provider Global: -- Swollen: --; Tender: -- Joint Exam 08/10/2023   No joint exam has been documented for this visit   There is currently no information documented on the homunculus. Go to the Rheumatology activity and complete the homunculus joint exam.  Investigation: No additional findings.  Imaging: No results found.  Recent Labs: Lab Results  Component  Value Date   WBC 7.6 06/09/2023   HGB 14.3 06/09/2023   PLT 268 06/09/2023   NA 144 07/18/2023   K 4.1 07/18/2023   CL 111 07/18/2023   CO2 26 07/18/2023   GLUCOSE 87 07/18/2023   BUN 9 07/18/2023   CREATININE 1.13 (H) 07/18/2023   BILITOT 0.6 07/18/2023   ALKPHOS 67 07/18/2023   AST 22 07/18/2023   ALT 18 07/18/2023   PROT 6.4 (L) 07/18/2023   ALBUMIN 3.2 (L) 07/18/2023   CALCIUM 8.9 07/18/2023   GFRAA >60 12/26/2019   QFTBGOLD Negative 09/01/2016   QFTBGOLDPLUS Negative 09/20/2022    Speciality Comments: ORENCIA 1000 mg x 4 weeks  Procedures:  No procedures performed Allergies: Influenza vaccines, Influenza virus vaccine, Amitriptyline, and Isosorbide   Assessment / Plan:     Visit Diagnoses: Rheumatoid arthritis involving multiple sites with positive rheumatoid factor (HCC)  High risk medication use  Primary osteoarthritis of both knees  Trochanteric bursitis of both hips  Fibromyalgia  Other fatigue  Other insomnia  History of hypertension  Dyslipidemia  History of coronary  artery disease  Solitary pulmonary nodule  History of diastolic dysfunction  History of depression  S/P laparoscopic sleeve gastrectomy  History of vitamin D deficiency  Orders: No orders of the defined types were placed in this encounter.  No orders of the defined types were placed in this encounter.   Face-to-face time spent with patient was *** minutes. Greater than 50% of time was spent in counseling and coordination of care.  Follow-Up Instructions: No follow-ups on file.   Gearldine Bienenstock, PA-C  Note - This record has been created using Dragon software.  Chart creation errors have been sought, but may not always  have been located. Such creation errors do not reflect on  the standard of medical care.

## 2023-08-10 ENCOUNTER — Ambulatory Visit: Payer: BC Managed Care – PPO | Admitting: Physician Assistant

## 2023-08-10 ENCOUNTER — Ambulatory Visit: Admitting: Physician Assistant

## 2023-08-10 DIAGNOSIS — G4709 Other insomnia: Secondary | ICD-10-CM

## 2023-08-10 DIAGNOSIS — G8929 Other chronic pain: Secondary | ICD-10-CM

## 2023-08-10 DIAGNOSIS — Z79899 Other long term (current) drug therapy: Secondary | ICD-10-CM

## 2023-08-10 DIAGNOSIS — M7061 Trochanteric bursitis, right hip: Secondary | ICD-10-CM

## 2023-08-10 DIAGNOSIS — R911 Solitary pulmonary nodule: Secondary | ICD-10-CM

## 2023-08-10 DIAGNOSIS — Z8679 Personal history of other diseases of the circulatory system: Secondary | ICD-10-CM

## 2023-08-10 DIAGNOSIS — R5383 Other fatigue: Secondary | ICD-10-CM

## 2023-08-10 DIAGNOSIS — Z8659 Personal history of other mental and behavioral disorders: Secondary | ICD-10-CM

## 2023-08-10 DIAGNOSIS — M0579 Rheumatoid arthritis with rheumatoid factor of multiple sites without organ or systems involvement: Secondary | ICD-10-CM

## 2023-08-10 DIAGNOSIS — M797 Fibromyalgia: Secondary | ICD-10-CM

## 2023-08-10 DIAGNOSIS — Z9884 Bariatric surgery status: Secondary | ICD-10-CM

## 2023-08-10 DIAGNOSIS — E785 Hyperlipidemia, unspecified: Secondary | ICD-10-CM

## 2023-08-10 DIAGNOSIS — M170 Bilateral primary osteoarthritis of knee: Secondary | ICD-10-CM

## 2023-08-10 DIAGNOSIS — Z8639 Personal history of other endocrine, nutritional and metabolic disease: Secondary | ICD-10-CM

## 2023-08-15 ENCOUNTER — Encounter (HOSPITAL_COMMUNITY): Payer: BC Managed Care – PPO

## 2023-08-17 ENCOUNTER — Ambulatory Visit: Attending: Physician Assistant | Admitting: Physician Assistant

## 2023-08-17 ENCOUNTER — Encounter: Payer: Self-pay | Admitting: Physician Assistant

## 2023-08-17 ENCOUNTER — Other Ambulatory Visit: Payer: Self-pay | Admitting: Pharmacist

## 2023-08-17 ENCOUNTER — Other Ambulatory Visit: Payer: Self-pay

## 2023-08-17 ENCOUNTER — Encounter (HOSPITAL_COMMUNITY)
Admission: RE | Admit: 2023-08-17 | Discharge: 2023-08-17 | Disposition: A | Discharge status: Home / Self Care | Source: Ambulatory Visit | Attending: Rheumatology | Admitting: Rheumatology

## 2023-08-17 VITALS — BP 121/85 | HR 79 | Resp 16 | Ht 62.0 in | Wt 270.2 lb

## 2023-08-17 DIAGNOSIS — Z111 Encounter for screening for respiratory tuberculosis: Secondary | ICD-10-CM

## 2023-08-17 DIAGNOSIS — M7061 Trochanteric bursitis, right hip: Secondary | ICD-10-CM

## 2023-08-17 DIAGNOSIS — M0579 Rheumatoid arthritis with rheumatoid factor of multiple sites without organ or systems involvement: Secondary | ICD-10-CM

## 2023-08-17 DIAGNOSIS — Z79899 Other long term (current) drug therapy: Secondary | ICD-10-CM

## 2023-08-17 DIAGNOSIS — Z9884 Bariatric surgery status: Secondary | ICD-10-CM

## 2023-08-17 DIAGNOSIS — G8929 Other chronic pain: Secondary | ICD-10-CM

## 2023-08-17 DIAGNOSIS — Z8659 Personal history of other mental and behavioral disorders: Secondary | ICD-10-CM

## 2023-08-17 DIAGNOSIS — M25512 Pain in left shoulder: Secondary | ICD-10-CM | POA: Diagnosis not present

## 2023-08-17 DIAGNOSIS — R5383 Other fatigue: Secondary | ICD-10-CM

## 2023-08-17 DIAGNOSIS — M7062 Trochanteric bursitis, left hip: Secondary | ICD-10-CM

## 2023-08-17 DIAGNOSIS — M170 Bilateral primary osteoarthritis of knee: Secondary | ICD-10-CM

## 2023-08-17 DIAGNOSIS — E785 Hyperlipidemia, unspecified: Secondary | ICD-10-CM

## 2023-08-17 DIAGNOSIS — M797 Fibromyalgia: Secondary | ICD-10-CM

## 2023-08-17 DIAGNOSIS — Z8679 Personal history of other diseases of the circulatory system: Secondary | ICD-10-CM

## 2023-08-17 DIAGNOSIS — R911 Solitary pulmonary nodule: Secondary | ICD-10-CM

## 2023-08-17 DIAGNOSIS — G4709 Other insomnia: Secondary | ICD-10-CM

## 2023-08-17 DIAGNOSIS — Z8639 Personal history of other endocrine, nutritional and metabolic disease: Secondary | ICD-10-CM

## 2023-08-17 LAB — CBC WITH DIFFERENTIAL/PLATELET
Abs Immature Granulocytes: 0.04 10*3/uL (ref 0.00–0.07)
Basophils Absolute: 0 10*3/uL (ref 0.0–0.1)
Basophils Relative: 0 %
Eosinophils Absolute: 0.2 10*3/uL (ref 0.0–0.5)
Eosinophils Relative: 2 %
HCT: 45 % (ref 36.0–46.0)
Hemoglobin: 14.2 g/dL (ref 12.0–15.0)
Immature Granulocytes: 0 %
Lymphocytes Relative: 40 %
Lymphs Abs: 3.6 10*3/uL (ref 0.7–4.0)
MCH: 26.8 pg (ref 26.0–34.0)
MCHC: 31.6 g/dL (ref 30.0–36.0)
MCV: 84.9 fL (ref 80.0–100.0)
Monocytes Absolute: 0.4 10*3/uL (ref 0.1–1.0)
Monocytes Relative: 5 %
Neutro Abs: 4.7 10*3/uL (ref 1.7–7.7)
Neutrophils Relative %: 53 %
Platelets: 264 10*3/uL (ref 150–400)
RBC: 5.3 MIL/uL — ABNORMAL HIGH (ref 3.87–5.11)
RDW: 12.5 % (ref 11.5–15.5)
WBC: 8.9 10*3/uL (ref 4.0–10.5)
nRBC: 0 % (ref 0.0–0.2)

## 2023-08-17 LAB — COMPREHENSIVE METABOLIC PANEL
ALT: 19 U/L (ref 0–44)
AST: 30 U/L (ref 15–41)
Albumin: 3.4 g/dL — ABNORMAL LOW (ref 3.5–5.0)
Alkaline Phosphatase: 64 U/L (ref 38–126)
Anion gap: 9 (ref 5–15)
BUN: 10 mg/dL (ref 6–20)
CO2: 23 mmol/L (ref 22–32)
Calcium: 9 mg/dL (ref 8.9–10.3)
Chloride: 111 mmol/L (ref 98–111)
Creatinine, Ser: 0.97 mg/dL (ref 0.44–1.00)
GFR, Estimated: >60 mL/min (ref >60)
Glucose, Bld: 129 mg/dL — ABNORMAL HIGH (ref 70–99)
Potassium: 3.5 mmol/L (ref 3.5–5.1)
Sodium: 143 mmol/L (ref 135–145)
Total Bilirubin: 0.3 mg/dL (ref 0.0–1.2)
Total Protein: 6.9 g/dL (ref 6.5–8.1)

## 2023-08-17 MED ORDER — ACETAMINOPHEN 325 MG PO TABS
650.0000 mg | ORAL_TABLET | Freq: Once | ORAL | Status: AC
  Administered 2023-08-17: 650 mg via ORAL

## 2023-08-17 MED ORDER — SODIUM CHLORIDE 0.9 % IV SOLN
1000.0000 mg | INTRAVENOUS | Status: AC
Start: 2023-08-17 — End: 2023-08-17
  Administered 2023-08-17: 1000 mg via INTRAVENOUS
  Filled 2023-08-17: qty 40

## 2023-08-17 MED ORDER — LIDOCAINE 5 % EX PTCH: MEDICATED_PATCH | CUTANEOUS | 2 refills | Status: DC

## 2023-08-17 MED ORDER — GABAPENTIN 300 MG PO CAPS: ORAL_CAPSULE | ORAL | 2 refills | Status: DC

## 2023-08-17 MED ORDER — FOLIC ACID 1 MG PO TABS: 2.0000 mg | ORAL_TABLET | Freq: Every day | ORAL | 3 refills | Status: AC

## 2023-08-17 MED ORDER — DIPHENHYDRAMINE HCL 25 MG PO CAPS
ORAL_CAPSULE | ORAL | Status: AC
  Filled 2023-08-17: qty 1

## 2023-08-17 MED ORDER — CYCLOBENZAPRINE HCL 10 MG PO TABS: ORAL_TABLET | ORAL | 2 refills | Status: DC

## 2023-08-17 MED ORDER — DIPHENHYDRAMINE HCL 25 MG PO CAPS
25.0000 mg | ORAL_CAPSULE | Freq: Once | ORAL | Status: AC
  Administered 2023-08-17: 25 mg via ORAL

## 2023-08-17 MED ORDER — ACETAMINOPHEN 325 MG PO TABS
ORAL_TABLET | ORAL | Status: AC
Start: 2023-08-17 — End: 2023-08-17
  Filled 2023-08-17: qty 2

## 2023-08-17 MED ORDER — DICLOFENAC SODIUM 1 % EX GEL: CUTANEOUS | 2 refills | Status: AC

## 2023-08-17 NOTE — Progress Notes (Signed)
 Glucose is 129.  Albumin is low but has improved-3.4. rest of CMP WNL

## 2023-08-17 NOTE — Progress Notes (Signed)
 Office Visit Note  Patient: [name] [name]             Date of Birth: [birth year]           MRN: [id number]             PCP: [name], MD Referring: [name], MD Visit Date: 08/17/2023 Occupation: @GUAROCC @  Subjective:  Chronic pian   History of Present Illness: [name] [name] is a 57 y.o. female with history of  seropositive rheumatoid arthritis.  Patient remains on IV Orencia 1,000 mg infusions every 28 days, Rasuvo 25 mg per 0.5 mL subcutaneous weekly, and Folic acid 2 mg daily.  Patient reports that last week she was treated with a course of antibiotics for double ear infection.  She completed a course of antibiotics on Sunday and her symptoms have completely resolved.  She is scheduled for her Orencia infusion this morning.  Patient states that she held her dose of Rasuvo last week while recovering from the infection.  She states that during the gap in therapy she had some increased pain and stiffness involving both elbows, both wrists, both hands.  Her symptoms have gradually started to improve.  Patient states she continues to have chronic pain in her left shoulder.  She underwent surgery performed by Dr. Aundria Rud on 02/23/2024.  She is going to physical therapy twice a week to improve range of motion. Patient requested refills of gabapentin, Flexeril, folic acid, Voltaren gel, and Lidoderm patches.  Activities of Daily Living:  Patient reports morning stiffness for 1 hour.   Patient Reports nocturnal pain.  Difficulty dressing/grooming: Reports Difficulty climbing stairs: Reports Difficulty getting out of chair: Reports Difficulty using hands for taps, buttons, cutlery, and/or writing: Reports  Review of Systems  Constitutional:  Positive for fatigue.  HENT:  Positive for mouth dryness. Negative for mouth sores and nose dryness.   Eyes:  Negative for pain and dryness.  Respiratory:  Positive for shortness of breath. Negative for difficulty breathing.    Cardiovascular:  Positive for palpitations. Negative for chest pain.       Due to anxiety per patient   Gastrointestinal:  Positive for diarrhea. Negative for blood in stool and constipation.  Endocrine: Negative for increased urination.  Genitourinary:  Negative for involuntary urination.  Musculoskeletal:  Positive for joint pain, gait problem, joint pain, joint swelling, myalgias, muscle weakness, morning stiffness, muscle tenderness and myalgias.  Skin:  Positive for sensitivity to sunlight. Negative for color change, rash and hair loss.  Allergic/Immunologic: Positive for susceptible to infections.  Neurological:  Positive for dizziness and headaches.  Hematological:  Positive for swollen glands.  Psychiatric/Behavioral:  Positive for sleep disturbance. Negative for depressed mood. The patient is nervous/anxious.     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   Dyslipidemia 01/30/2015   MDD (major depressive disorder), recurrent severe, 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   Essential hypertension 08/25/2014   Morbid  obesity (HCC) 08/25/2014   CAD (coronary artery disease), native coronary artery 08/24/2014   Numbness and tingling in left arm    Arm numbness left 08/22/2014   Chest pain 08/22/2014   Gastroesophageal reflux disease without esophagitis 07/23/2014   Coronary artery calcification seen on CAT scan 02/19/2014    Hip pain 10/23/2013   Solitary pulmonary nodule 08/28/2013   Chest pain, atypical 07/18/2013   Heart palpitations 06/27/2013   Cough 06/04/2013   Diastolic dysfunction 05/18/2013   SOB (shortness of breath) 05/09/2013   Other malaise and fatigue 01/21/2013   Stress and adjustment reaction 12/09/2012   Right sided abdominal pain 11/29/2012   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/21/2012   Morbid obesity with BMI of 50.0-59.9, adult (HCC) 11/21/2012   Arthritis 07/13/2012   Depression 07/13/2012   Routine health maintenance 05/07/2012   Migraine 06/07/1998    Past Medical History:  Diagnosis Date   Anxiety    Bursitis    CAD (coronary artery disease), native coronary artery 04/2023   coronary Ca score of 289 with <25% stenosis of the prox to mid LAD and prox LCx.   Depression    Diastolic dysfunction    Dyslipidemia 01/30/2015   Esophageal ring    Fibromyalgia    Hiatal hernia    HSV-1 infection    Morbid obesity (HCC)    Osteoarthritis    Rheumatoid arthritis(714.0)    M05.79   Rheumatoid arthritis, seropositive, 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HCC)    Treated with Orencia, TB neg 09/12/2015   Spondylolysis     Family History  Problem Relation Age of Onset   Hyperlipidemia Mother    Depression Mother    Sarcoidosis Father    Lung disease Father        Pleural Mesothelioma   Cancer Father    Hypertension Maternal Grandmother    Diabetes Maternal Grandmother    Heart attack Paternal Grandmother    Hypertension Paternal Grandmother    Stroke Neg Hx    Colon cancer Neg Hx    Esophageal cancer Neg Hx    Rectal cancer Neg Hx    Stomach cancer Neg Hx    Past Surgical History:  Procedure Laterality Date   CARDIAC CATHETERIZATION N/A 06/11/2016   Procedure: Left Heart Cath and Coronary Angiography;  Surgeon: Corky Crafts, MD;  Location: [facility]ASIVE [facility];  Service: Cardiovascular;  Laterality: N/A;   CESAREAN SECTION     COLONOSCOPY  07/27/2017   ESOPHAGEAL  MANOMETRY N/A 10/28/2014   Procedure: ESOPHAGEAL MANOMETRY (EM);  Surgeon: Hilarie Fredrickson, MD;  Location: [facility] ENDOSCOPY;  Service: Endoscopy;  Laterality: N/A;   HERNIA REPAIR     reports surgery on 3 hernias, with 2 more present   LAPAROSCOPIC GASTRIC SLEEVE RESECTION  11/21/2012   [facility]   LEFT HEART CATHETERIZATION WITH CORONARY ANGIOGRAM N/A 08/26/2014   Procedure: LEFT HEART CATHETERIZATION WITH CORONARY ANGIOGRAM;  Surgeon: Runell Gess, MD;  Location: [facility];  Service: Cardiovascular;  Laterality: N/A;   SHOULDER SURGERY Left 10/04/2022   TUBAL LIGATION     Social History   Social History Narrative   Marital Status: Married Probation officer)    Children:  Son [name]) Daughter [name])    Pets: None    Living Situation: Lives with husband and children    Occupation: Occupational psychologist Administrator)     Education: Oncologist (Psychology)     Tobacco Use/Exposure:  None    Alcohol Use:  Occasional  Drug Use:  None   Diet:  Regular   Exercise: Walking or Treadmill (2 x week)   Hobbies: Crafting, Christmas Decorations.               Immunization History  Administered Date(s) Administered   PFIZER(Purple Top)SARS-COV-2 Vaccination 08/30/2019, 09/20/2019, 06/05/2020   Tdap 09/21/2016     Objective: Vital Signs: BP 121/85 (BP Location: Right Arm, Patient Position: Sitting, Cuff Size: Large)   Pulse 79   Resp 16   Ht 5\' 2"  (1.575 m)   Wt 270 lb 3.2 oz (122.6 kg)   BMI 49.42 kg/m    Physical Exam Vitals and nursing note reviewed.  Constitutional:      Appearance: She is well-developed.  HENT:     Head: Normocephalic and atraumatic.  Eyes:     Conjunctiva/sclera: Conjunctivae normal.  Cardiovascular:     Rate and Rhythm: Normal rate and regular rhythm.     Heart sounds: Normal heart sounds.  Pulmonary:     Effort: Pulmonary effort is normal.     Breath sounds: Normal breath sounds.  Abdominal:     General: Bowel sounds are normal.      Palpations: Abdomen is soft.  Musculoskeletal:     Cervical back: Normal range of motion.  Lymphadenopathy:     Cervical: No cervical adenopathy.  Skin:    General: Skin is warm and dry.     Capillary Refill: Capillary refill takes less than 2 seconds.  Neurological:     Mental Status: She is alert and oriented to person, place, and time.  Psychiatric:        Behavior: Behavior normal.      Musculoskeletal Exam: Patient remained seated during examination today.  C-spine has good range of motion with some discomfort particularly with lateral rotation and extension.  Left shoulder has painful limited range of motion.  Right shoulder has full range of motion.  Elbow joints have good range of motion.  Wrist joints have good range of motion with no tenderness or synovitis.  Complete fist formation bilaterally.  No tenderness or synovitis over MCP joints.  Hip joints difficult assess in seated position.  Discomfort range of motion of both knee joints but no effusion noted.  Ankle joints have good range of motion with no tenderness or joint swelling.  CDAI Exam: CDAI Score: -- Patient Global: --; Provider Global: -- Swollen: --; Tender: -- Joint Exam 08/17/2023   No joint exam has been documented for this visit   There is currently no information documented on the homunculus. Go to the Rheumatology activity and complete the homunculus joint exam.  Investigation: No additional findings.  Imaging: No results found.  Recent Labs: Lab Results  Component Value Date   WBC 7.6 06/09/2023   HGB 14.3 06/09/2023   PLT 268 06/09/2023   NA 144 07/18/2023   K 4.1 07/18/2023   CL 111 07/18/2023   CO2 26 07/18/2023   GLUCOSE 87 07/18/2023   BUN 9 07/18/2023   CREATININE 1.13 (H) 07/18/2023   BILITOT 0.6 07/18/2023   ALKPHOS 67 07/18/2023   AST 22 07/18/2023   ALT 18 07/18/2023   PROT 6.4 (L) 07/18/2023   ALBUMIN 3.2 (L) 07/18/2023   CALCIUM 8.9 07/18/2023   GFRAA >60 12/26/2019    QFTBGOLD Negative 09/01/2016   QFTBGOLDPLUS Negative 09/20/2022    Speciality Comments: ORENCIA 1000 mg x 4 weeks  Procedures:  No procedures performed Allergies: Influenza vaccines, Influenza virus vaccine, Amitriptyline, and Isosorbide  Assessment / Plan:     Visit Diagnoses: Rheumatoid arthritis involving multiple sites with positive rheumatoid factor (HCC): She has no synovitis on examination today.  She has been experiencing increased stiffness in both elbows, both wrists, both hands which he attributes to holding her dose of Rasuvo last week while recovering from a bilateral ear infection.  Patient was treated with a course of antibiotics which she completed on Sunday and has had a complete resolution of symptoms.  She is scheduled for her monthly Orencia infusion today.  She denies any recurrent infections recently.  Patient will remain on IV Orencia infusions every 28 days, Rasuvo 25 mg sq injections once weekly, and folic acid 2 mg daily.  A refill of folic acid was sent to the pharmacy today as requested.  She was advised to notify us if she develops any new or worsening symptoms.  She will follow-up in the office in 5 months or sooner if needed.  High risk medication use: IV Orencia 1,000 mg infusions every 28 days, Rasuvo 25 mg per 0.5 mL subcutaneous weekly, and Folic acid 2 mg daily.  CMP updated on 07/18/23.   CBC updated on 06/09/23.  She will continue to have updated CBC and CMP with her infusions. Lipid panel 06/15/23.   TB gold negative on 09/20/22.  Patient recently had a bilateral ear infection and was treated with a course of antibiotics.  Antibiotics were completed on Sunday and she has had a complete resolution of symptoms.  Discussed the importance of holding Rasuvo intermittent see if she develops signs or symptoms of an infection and to resume once the infection has completely cleared.  Chronic left shoulder pain - Under care of Dr. Aundria Rud.  Surgical intervention 10/04/22 and  on again on 02/23/2024.  She is going to physical therapy twice a week.  She continues to have chronic pain and stiffness in the left shoulder.  Primary osteoarthritis of both knees - MRI of the right knee on 05/23/2020 which was negative for meniscal or ligament tear.  Patient continues to have chronic pain and stiffness involving both knees.  No effusion noted on examination today.  She requested a refill of Voltaren gel to be sent to the pharmacy today.  Trochanteric bursitis of both hips: Intermittent discomfort.  Fibromyalgia: Patient continues to have generalized malaise with a muscle tenderness due to fibromyalgia.  She remains on gabapentin and Flexeril as prescribed.  She requested refills of gabapentin, Flexeril, and Lidoderm patches today.  Other fatigue: Chronic, stable.  Other insomnia: Discussed the importance of good sleep hygiene.  Other medical conditions are listed as follows:  History of hypertension: Blood pressure is 121/85 today in the office.  Dyslipidemia: Lipid panel updated on 06/15/2023.  History of coronary artery disease  Solitary pulmonary nodule  History of diastolic dysfunction  History of depression  S/P laparoscopic sleeve gastrectomy  History of vitamin D deficiency  Orders: No orders of the defined types were placed in this encounter.  No orders of the defined types were placed in this encounter.     Follow-Up Instructions: Return in about 5 months (around 01/17/2024) for Rheumatoid arthritis.   Gearldine Bienenstock, PA-C  Note - This record has been created using Dragon software.  Chart creation errors have been sought, but may not always  have been located. Such creation errors do not reflect on  the standard of medical care.

## 2023-08-17 NOTE — Progress Notes (Signed)
 Next infusion scheduled for Orencia IV ([id number]) on 09/14/23 and due for updated orders. Diagnosis: RA  Dose: 1000mg  every 4 weeks (appropriate based on last recorded weight of 122.6kg)   Last Clinic Visit: 08/17/23 Next Clinic Visit: 01/24/24  Last infusion: 08/17/23  Labs: CBC and CMP on 08/17/23 TB Gold: negative 09/20/22   Orders placed for Orencia IV ([id number]) x 3 doses along with premedication of acetaminophen and diphenhydramine to be administered 30 minutes before medication infusion.  Standing CBC with diff/platelet and CMP with GFR orders placed to be drawn every 2 months.  Next TB gold due 09/2023 (order placed)  Chesley Mires, PharmD, MPH, BCPS, CPP Clinical Pharmacist (Rheumatology and Pulmonology)

## 2023-08-17 NOTE — Telephone Encounter (Signed)
 Please review and sign pended refills for gabapentin, flexeril, folic acid, voltaren gel and lidoderm patches. Thanks!

## 2023-08-17 NOTE — Progress Notes (Signed)
 RBC count remains borderline elevated but stable. Rest of CBC WNL.

## 2023-08-18 ENCOUNTER — Encounter (HOSPITAL_COMMUNITY)

## 2023-09-14 ENCOUNTER — Encounter (HOSPITAL_COMMUNITY)
Admission: RE | Admit: 2023-09-14 | Discharge: 2023-09-14 | Disposition: A | Discharge status: Home / Self Care | Source: Ambulatory Visit | Attending: Rheumatology | Admitting: Rheumatology

## 2023-09-14 VITALS — BP 134/75 | HR 59 | Temp 97.6°F | Resp 16

## 2023-09-14 DIAGNOSIS — Z111 Encounter for screening for respiratory tuberculosis: Secondary | ICD-10-CM | POA: Diagnosis present

## 2023-09-14 DIAGNOSIS — N912 Amenorrhea, unspecified: Secondary | ICD-10-CM | POA: Diagnosis present

## 2023-09-14 DIAGNOSIS — M0579 Rheumatoid arthritis with rheumatoid factor of multiple sites without organ or systems involvement: Secondary | ICD-10-CM | POA: Diagnosis present

## 2023-09-14 DIAGNOSIS — Z79899 Other long term (current) drug therapy: Secondary | ICD-10-CM | POA: Diagnosis present

## 2023-09-14 DIAGNOSIS — M533 Sacrococcygeal disorders, not elsewhere classified: Secondary | ICD-10-CM | POA: Diagnosis present

## 2023-09-14 MED ORDER — DIPHENHYDRAMINE HCL 25 MG PO CAPS: 25.0000 mg | ORAL_CAPSULE | ORAL | Status: DC

## 2023-09-14 MED ORDER — ACETAMINOPHEN 325 MG PO TABS
ORAL_TABLET | ORAL | Status: AC
  Administered 2023-09-14: 650 mg via ORAL
  Filled 2023-09-14: qty 2

## 2023-09-14 MED ORDER — DIPHENHYDRAMINE HCL 25 MG PO CAPS
ORAL_CAPSULE | ORAL | Status: AC
Start: 2023-09-14 — End: 2023-09-14
  Administered 2023-09-14: 25 mg via ORAL
  Filled 2023-09-14: qty 1

## 2023-09-14 MED ORDER — SODIUM CHLORIDE 0.9 % IV SOLN
1000.0000 mg | INTRAVENOUS | Status: DC
  Administered 2023-09-14: 1000 mg via INTRAVENOUS
  Filled 2023-09-14: qty 40

## 2023-09-14 MED ORDER — ACETAMINOPHEN 325 MG PO TABS: 650.0000 mg | ORAL_TABLET | ORAL | Status: DC

## 2023-09-17 LAB — QUANTIFERON-TB GOLD PLUS (RQFGPL)
QuantiFERON Mitogen Value: 2.81 [IU]/mL
QuantiFERON Nil Value: 0.05 [IU]/mL
QuantiFERON TB1 Ag Value: 0.06 [IU]/mL
QuantiFERON TB2 Ag Value: 0.07 [IU]/mL

## 2023-09-17 LAB — QUANTIFERON-TB GOLD PLUS: QuantiFERON-TB Gold Plus: NEGATIVE

## 2023-09-17 NOTE — Progress Notes (Signed)
TB Gold negative

## 2023-10-10 ENCOUNTER — Other Ambulatory Visit (HOSPITAL_COMMUNITY): Payer: Self-pay | Admitting: *Deleted

## 2023-10-11 ENCOUNTER — Encounter (HOSPITAL_COMMUNITY)
Admission: RE | Admit: 2023-10-11 | Discharge: 2023-10-11 | Disposition: A | Discharge status: Home / Self Care | Source: Ambulatory Visit | Attending: Rheumatology | Admitting: Rheumatology

## 2023-10-11 DIAGNOSIS — Z79899 Other long term (current) drug therapy: Secondary | ICD-10-CM | POA: Diagnosis present

## 2023-10-11 DIAGNOSIS — M0579 Rheumatoid arthritis with rheumatoid factor of multiple sites without organ or systems involvement: Secondary | ICD-10-CM | POA: Diagnosis present

## 2023-10-11 LAB — CBC WITH DIFFERENTIAL/PLATELET
Abs Immature Granulocytes: 0.02 10*3/uL (ref 0.00–0.07)
Basophils Absolute: 0.1 10*3/uL (ref 0.0–0.1)
Basophils Relative: 1 %
Eosinophils Absolute: 0.2 10*3/uL (ref 0.0–0.5)
Eosinophils Relative: 2 %
HCT: 44.6 % (ref 36.0–46.0)
Hemoglobin: 14 g/dL (ref 12.0–15.0)
Immature Granulocytes: 0 %
Lymphocytes Relative: 52 %
Lymphs Abs: 4.3 10*3/uL — ABNORMAL HIGH (ref 0.7–4.0)
MCH: 26.5 pg (ref 26.0–34.0)
MCHC: 31.4 g/dL (ref 30.0–36.0)
MCV: 84.3 fL (ref 80.0–100.0)
Monocytes Absolute: 0.4 10*3/uL (ref 0.1–1.0)
Monocytes Relative: 5 %
Neutro Abs: 3.3 10*3/uL (ref 1.7–7.7)
Neutrophils Relative %: 40 %
Platelets: 265 10*3/uL (ref 150–400)
RBC: 5.29 MIL/uL — ABNORMAL HIGH (ref 3.87–5.11)
RDW: 12.6 % (ref 11.5–15.5)
WBC: 8.3 10*3/uL (ref 4.0–10.5)
nRBC: 0 % (ref 0.0–0.2)

## 2023-10-11 LAB — COMPREHENSIVE METABOLIC PANEL WITH GFR
ALT: 18 U/L (ref 0–44)
AST: 26 U/L (ref 15–41)
Albumin: 3.5 g/dL (ref 3.5–5.0)
Alkaline Phosphatase: 67 U/L (ref 38–126)
Anion gap: 9 (ref 5–15)
BUN: 8 mg/dL (ref 6–20)
CO2: 25 mmol/L (ref 22–32)
Calcium: 9.3 mg/dL (ref 8.9–10.3)
Chloride: 106 mmol/L (ref 98–111)
Creatinine, Ser: 0.99 mg/dL (ref 0.44–1.00)
GFR, Estimated: >60 mL/min (ref >60)
Glucose, Bld: 124 mg/dL — ABNORMAL HIGH (ref 70–99)
Potassium: 3.5 mmol/L (ref 3.5–5.1)
Sodium: 140 mmol/L (ref 135–145)
Total Bilirubin: 0.6 mg/dL (ref 0.0–1.2)
Total Protein: 7.1 g/dL (ref 6.5–8.1)

## 2023-10-11 MED ORDER — SODIUM CHLORIDE 0.9 % IV SOLN
1000.0000 mg | INTRAVENOUS | Status: DC
  Administered 2023-10-11: 1000 mg via INTRAVENOUS
  Filled 2023-10-11: qty 40

## 2023-10-11 MED ORDER — DIPHENHYDRAMINE HCL 25 MG PO CAPS
ORAL_CAPSULE | ORAL | Status: AC
  Filled 2023-10-11: qty 1

## 2023-10-11 MED ORDER — ACETAMINOPHEN 325 MG PO TABS
ORAL_TABLET | ORAL | Status: AC
Start: 2023-10-11 — End: 2023-10-11
  Filled 2023-10-11: qty 2

## 2023-10-11 MED ORDER — DIPHENHYDRAMINE HCL 25 MG PO CAPS
25.0000 mg | ORAL_CAPSULE | Freq: Once | ORAL | Status: AC
  Administered 2023-10-11: 25 mg via ORAL

## 2023-10-11 MED ORDER — ACETAMINOPHEN 325 MG PO TABS
650.0000 mg | ORAL_TABLET | Freq: Once | ORAL | Status: AC
  Administered 2023-10-11: 650 mg via ORAL

## 2023-10-11 NOTE — Progress Notes (Signed)
 CBC and CMP are stable.

## 2023-10-12 ENCOUNTER — Telehealth: Payer: Self-pay | Admitting: Pharmacist

## 2023-10-12 ENCOUNTER — Inpatient Hospital Stay (HOSPITAL_COMMUNITY): Admission: RE | Admit: 2023-10-12 | Source: Ambulatory Visit

## 2023-10-12 NOTE — Telephone Encounter (Signed)
 Received letter from [facility] that Rasuvo  will no longer be covered starting 12/06/2023. Otrexup  will become preferred option. Will need to update PA closer to this date  MyChart message sent to pt advising of letter and that pharmacy team will f/u closer to this deadline  Geraldene Kleine, PharmD, MPH, BCPS, CPP Clinical Pharmacist (Rheumatology and Pulmonology)

## 2023-10-24 ENCOUNTER — Encounter: Payer: Self-pay | Admitting: Emergency Medicine

## 2023-10-24 ENCOUNTER — Ambulatory Visit
Admission: EM | Admit: 2023-10-24 | Discharge: 2023-10-24 | Disposition: A | Discharge status: Home / Self Care | Attending: Internal Medicine | Admitting: Internal Medicine

## 2023-10-24 DIAGNOSIS — J029 Acute pharyngitis, unspecified: Secondary | ICD-10-CM | POA: Insufficient documentation

## 2023-10-24 LAB — POCT RAPID STREP A (OFFICE): Rapid Strep A Screen: NEGATIVE

## 2023-10-24 NOTE — Discharge Instructions (Signed)
 Strep test in the clinic is negative, staff will call you if the throat culture is positive for bacteria in the next 2-3 days and call in treatment if necessary based on result.   For now, we will treat this as a viral infection with the following: - Over the counter medicines as needed for pain and swelling of the throat (tylenol  1,00mg  every 6 hours as needed). - 1 tablespoon of honey in warm water  and/or salt water  gargles every 3-4 hours  Seek medical care if you develop changes to your voice, inability to swallow, drooling, or any new symptoms. If symptoms are severe, please go to the ER. I hope you feel better!

## 2023-10-24 NOTE — ED Provider Notes (Signed)
 [facility]    CSN: [id number] Arrival date & time: 10/24/23  1559      History   Chief Complaint Chief Complaint  Patient presents with   Sore Throat    HPI DONNALYN JURAN is a 58 y.o. female.   Patient presents to urgent care for evaluation of sore throat that started yesterday.  She is additionally reporting a small amount of nasal congestion and clear mucus.  Sore throat is worsened by swallowing and is currently mild.  She has been exposed to her daughter who is currently undergoing treatment for group B strep infection to the throat found on throat culture.  She is concerned that she may also have a group B strep infection due to exposure to her daughter and she has a history of immunosuppression (rheumatoid arthritis).  She denies cough, N/V, abdominal pain, rash, and fever/chills. She is taking tylenol  with some relief.    Sore Throat    Past Medical History:  Diagnosis Date   Anxiety    Bursitis    CAD (coronary artery disease), native coronary artery 04/2023   coronary Ca score of 289 with <25% stenosis of the prox to mid LAD and prox LCx.   Depression    Diastolic dysfunction    Dyslipidemia 01/30/2015   Esophageal ring    Fibromyalgia    Hiatal hernia    HSV-1 infection    Morbid obesity (HCC)    Osteoarthritis    Rheumatoid arthritis(714.0)    M05.79   Rheumatoid arthritis, seropositive, 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HCC)    Treated with Orencia , TB neg 09/12/2015   Spondylolysis     Patient Active Problem List   Diagnosis Date Noted   Amenorrhea 06/09/2020   Anxiety 02/20/2018   Fatigue 02/20/2018   Low blood potassium 02/20/2018   Allergic rhinitis 12/14/2017   Fever blister 12/14/2017   Head congestion 12/14/2017   Hyperlipidemia 12/14/2017   Lateral epicondylitis of right elbow 05/04/2017   Sacral pain 03/23/2017   High risk medication use 08/03/2016   Fibromyalgia syndrome 08/03/2016   Abnormal cardiac CT angiography     Sacrococcygeal pain 02/20/2016   Rheumatoid arthritis involving 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HCC) 01/24/2016   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   Dyslipidemia 01/30/2015   MDD (major depressive disorder), recurrent severe, 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   Essential hypertension 08/25/2014   Morbid obesity (HCC) 08/25/2014   CAD (coronary artery disease), native coronary artery 08/24/2014   Numbness and tingling in left arm    Arm numbness left 08/22/2014   Chest pain 08/22/2014   Gastroesophageal reflux disease without esophagitis 07/23/2014   Coronary artery calcification seen on CAT scan 02/19/2014   Hip pain 10/23/2013   Solitary pulmonary nodule 08/28/2013   Chest pain, atypical 07/18/2013   Heart palpitations 06/27/2013   Cough 06/04/2013   Diastolic dysfunction 05/18/2013   SOB (shortness of breath) 05/09/2013   Other malaise and fatigue 01/21/2013   Stress and adjustment reaction 12/09/2012   Right sided abdominal pain 11/29/2012   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/21/2012   Morbid obesity with BMI of 50.0-59.9, adult (HCC) 11/21/2012   Arthritis 07/13/2012   Depression 07/13/2012   Routine health maintenance 05/07/2012   Migraine 06/07/1998    Past Surgical History:  Procedure Laterality Date   CARDIAC CATHETERIZATION N/A 06/11/2016   Procedure: Left Heart Cath and Coronary Angiography;  Surgeon: Lucendia Rusk, MD;  Location: [facility] INVASIVE CV LAB;  Service: Cardiovascular;  Laterality: N/A;   CESAREAN SECTION  COLONOSCOPY  07/27/2017   ESOPHAGEAL MANOMETRY N/A 10/28/2014   Procedure: ESOPHAGEAL MANOMETRY (EM);  Surgeon: Tobin Forts, MD;  Location: [facility] ENDOSCOPY;  Service: Endoscopy;  Laterality: N/A;   HERNIA REPAIR     reports surgery on 3 hernias, with 2 more present   LAPAROSCOPIC GASTRIC SLEEVE RESECTION  11/21/2012   [facility]   LEFT HEART CATHETERIZATION WITH CORONARY ANGIOGRAM N/A 08/26/2014   Procedure: LEFT HEART CATHETERIZATION WITH  CORONARY ANGIOGRAM;  Surgeon: Avanell Leigh, MD;  Location: [facility] CATH LAB;  Service: Cardiovascular;  Laterality: N/A;   SHOULDER SURGERY Left 10/04/2022   TUBAL LIGATION      OB History   No obstetric history on file.      Home Medications    Prior to Admission medications   Medication Sig Start Date End Date Taking? Authorizing Provider  Abatacept  (ORENCIA  IV) Inject 1,000 mg into the vein every 28 (twenty-eight) days.    Nicholas Bari, MD  acyclovir  ointment (ZOVIRAX ) 5 % as needed.    [provider]  Acyclovir -Hydrocortisone  5-1 % CREA Apply topically to cold sores as needed 12/14/22   Angelia Kelp, PA-C  Adapalene 0.3 % gel Apply 1 application topically daily as needed (acne).  12/31/14   [provider]  Alcohol  Swabs  (ALCOHOL  WIPES) 70 % PADS Alcohol  Prep Pads    [provider]  ALPRAZolam (XANAX) 0.5 MG tablet TAKE 1 TABLET BY MOUTH NIGHTLY AS NEEDED FOR ANXIETY/SLEEP INDICATIONS ANXIOUS, INSOMNIA GRIEVING.    [provider]  ARIPiprazole  (ABILIFY ) 2 MG tablet Take 2 mg by mouth daily. 09/25/21   [provider]  aspirin  EC 81 MG tablet Take 1 tablet (81 mg total) by mouth daily. Swallow whole. 10/13/21   Jacqueline Matsu, MD  atorvastatin  (LIPITOR ) 80 MG tablet     [provider]  Azelastine  HCl 137 MCG/SPRAY SOLN Place 1 spray into both nostrils 2 (two) times daily.    [provider]  benzonatate  (TESSALON ) 100 MG capsule Take 1 capsule (100 mg total) by mouth every 8 (eight) hours as needed for cough. 12/13/22   Dodson Freestone, FNP  betamethasone, augmented, (DIPROLENE) 0.05 % lotion Apply topically as needed. 01/23/22   [provider]  buPROPion  (WELLBUTRIN  XL) 150 MG 24 hr tablet Take 3 tablets by mouth every morning.    [provider]  cetirizine  (ZYRTEC  ALLERGY ) 10 MG tablet Take 1 tablet (10 mg total) by mouth daily. 10/08/21   Adolph Hoop, PA-C  ciclopirox (LOPROX) 0.77 % cream as  needed.    [provider]  cyclobenzaprine  (FLEXERIL ) 10 MG tablet TAKE 1 TABLET BY MOUTH AT BEDTIME AS NEEDED FOR MUSCLE SPASMS 08/17/23   Romayne Clubs, PA-C  diclofenac  Sodium (VOLTAREN ) 1 % GEL Apply 2-4 grams to affected joint 4 times daily as needed. 08/17/23   Romayne Clubs, PA-C  diltiazem  (CARDIZEM  CD) 180 MG 24 hr capsule TAKE 1 CAPSULE BY MOUTH EVERY DAY 04/22/23   Jacqueline Matsu, MD  doxepin  (SINEQUAN ) 25 MG capsule Take 25 mg by mouth at bedtime as needed. 04/28/19   [provider]  EPINEPHrine  0.3 mg/0.3 mL IJ SOAJ injection  07/02/19   [provider]  ezetimibe  (ZETIA ) 10 MG tablet TAKE 1 TABLET BY MOUTH EVERY DAY 07/11/23   Jacqueline Matsu, MD  famciclovir  (FAMVIR ) 500 MG tablet Take 15,000 mg by mouth daily as needed (fever blister).  01/21/15   [provider]  famotidine  (PEPCID ) 20  MG tablet Take 20 mg by mouth as needed. 09/22/21   [provider]  fluticasone  (FLONASE ) 50 MCG/ACT nasal spray Place 1 spray into both nostrils daily. Patient taking differently: Place 1 spray into both nostrils as needed. 12/13/22   Dodson Freestone, FNP  folic acid  (FOLVITE ) 1 MG tablet Take 2 tablets (2 mg total) by mouth daily. 08/17/23   Romayne Clubs, PA-C  gabapentin  (NEURONTIN ) 300 MG capsule TAKE 1 CAPSULE BY MOUTH 3 TIMES A DAY AS NEEDED FOR PAIN 08/17/23   Romayne Clubs, PA-C  hydrocortisone  2.5 % cream SMARTSIG:1 Topical Every Night 11/06/19   [provider]  hydrOXYzine  (VISTARIL ) 25 MG capsule Take 25 mg by mouth as needed. 10/04/21   [provider]  ketoconazole (NIZORAL) 2 % cream as needed. 03/14/20   [provider]  ketoconazole (NIZORAL) 2 % shampoo  06/03/20   [provider]  lidocaine  (LIDODERM ) 5 % APPLY 1 PATCH TO AFFECTED AREA FOR 12 HOURS IN A 24 HOUR PERIOD 08/17/23   Romayne Clubs, PA-C  LINZESS 145 MCG CAPS capsule Take 145 mcg by mouth daily. Patient not taking: Reported on 08/17/2023 01/26/22    [provider]  meclizine  (ANTIVERT ) 25 MG tablet Take 1 tablet (25 mg total) by mouth 3 (three) times daily as needed for dizziness. 04/30/23   Jackson, Hayn, MD  methocarbamol (ROBAXIN) 500 MG tablet as needed. 09/24/22   [provider]  metoprolol  tartrate (LOPRESSOR ) 100 MG tablet Take 1 tablet (100 mg total) by mouth once for 1 dose. Take 90-120 minutes prior to scan. Patient not taking: Reported on 08/17/2023 04/14/23 04/14/23  Jacqueline Matsu, MD  neomycin-polymyxin-hydrocortisone  (CORTISPORIN) 3.5-10000-1 OTIC suspension as needed.    [provider]  nitroGLYCERIN  (NITROSTAT ) 0.4 MG SL tablet Place 1 tablet (0.4 mg total) under the tongue every 5 (five) minutes as needed for chest pain. 04/14/23   Jacqueline Matsu, MD  NYAMYC powder Apply topically as needed. 03/17/20   [provider]  nystatin cream (MYCOSTATIN) Apply topically as needed. 01/23/22   [provider]  Oxymetazoline  HCl (NASAL SPRAY) 0.05 % SOLN     [provider]  pantoprazole  (PROTONIX ) 40 MG tablet Take by mouth as needed. 09/22/21   [provider]  progesterone (PROMETRIUM) 100 MG capsule Take 100 mg by mouth daily. Patient not taking: Reported on 08/17/2023 09/16/21   [provider]  propranolol (INDERAL) 10 MG tablet Take 10 mg by mouth 2 (two) times daily.    [provider]  pseudoephedrine  (SUDAFED) 60 MG tablet Take 1 tablet (60 mg total) by mouth every 8 (eight) hours as needed for congestion. 10/08/21   Adolph Hoop, PA-C  RASUVO  25 MG/0.5ML SOAJ INJECT 1 PEN UNDER THE SKIN ONCE EVERY WEEK 06/02/23   Romayne Clubs, PA-C  rosuvastatin  (CRESTOR ) 40 MG tablet Take 1 tablet (40 mg total) by mouth daily. 04/01/23   Jacqueline Matsu, MD  Triamcinolone  Acetonide 0.025 % LOTN as needed.    [provider]  valACYclovir (VALTREX) 500 MG tablet Take 500 mg by mouth as needed. 09/17/21   [provider]    Family History Family  History  Problem Relation Age of Onset   Hyperlipidemia Mother    Depression Mother    Sarcoidosis Father    Lung disease Father        Pleural Mesothelioma   Cancer Father    Hypertension Maternal Grandmother    Diabetes Maternal  Grandmother    Heart attack Paternal Grandmother    Hypertension Paternal Grandmother    Stroke Neg Hx    Colon cancer Neg Hx    Esophageal cancer Neg Hx    Rectal cancer Neg Hx    Stomach cancer Neg Hx     Social History Social History   Tobacco Use   Smoking status: Never    Passive exposure: Never   Smokeless tobacco: Never  Vaping Use   Vaping status: Never Used  Substance Use Topics   Alcohol  use: No   Drug use: No     Allergies   Influenza vaccines, Influenza virus vaccine, Amitriptyline, and Isosorbide    Review of Systems Review of Systems Per HPI  Physical Exam Triage Vital Signs ED Triage Vitals  Encounter Vitals Group     BP --      Systolic BP Percentile --      Diastolic BP Percentile --      Pulse Rate 10/24/23 1630 95     Resp 10/24/23 1630 20     Temp 10/24/23 1630 98.3 F (36.8 C)     Temp Source 10/24/23 1630 Oral     SpO2 10/24/23 1630 94 %     Weight --      Height --      Head Circumference --      Peak Flow --      Pain Score 10/24/23 1611 0     Pain Loc --      Pain Education --      Exclude from Growth Chart --    No data found.  Updated Vital Signs Pulse 95   Temp 98.3 F (36.8 C) (Oral)   Resp 20   SpO2 94%   Visual Acuity Right Eye Distance:   Left Eye Distance:   Bilateral Distance:    Right Eye Near:   Left Eye Near:    Bilateral Near:     Physical Exam Vitals and nursing note reviewed.  Constitutional:      Appearance: She is not ill-appearing or toxic-appearing.  HENT:     Head: Normocephalic and atraumatic.     Right Ear: Hearing, tympanic membrane, ear canal and external ear normal.     Left Ear: Hearing, tympanic membrane, ear canal and external ear normal.     Nose:  Nose normal.     Mouth/Throat:     Lips: Pink.     Mouth: Mucous membranes are moist. No injury or oral lesions.     Dentition: Normal dentition.     Tongue: No lesions.     Pharynx: Oropharynx is clear. Uvula midline. Posterior oropharyngeal erythema present. No pharyngeal swelling, oropharyngeal exudate, uvula swelling or postnasal drip.     Tonsils: No tonsillar exudate.     Comments: No trismus, phonation normal, maintaining secretions without difficulty.  Eyes:     General: Lids are normal. Vision grossly intact. Gaze aligned appropriately.     Extraocular Movements: Extraocular movements intact.     Conjunctiva/sclera: Conjunctivae normal.  Neck:     Trachea: Trachea and phonation normal.  Cardiovascular:     Rate and Rhythm: Normal rate and regular rhythm.     Heart sounds: Normal heart sounds, S1 normal and S2 normal.  Pulmonary:     Effort: Pulmonary effort is normal. No respiratory distress.     Breath sounds: Normal breath sounds and air entry.  Musculoskeletal:     Cervical back: Neck supple.  Lymphadenopathy:  Cervical: No cervical adenopathy.  Skin:    General: Skin is warm and dry.     Capillary Refill: Capillary refill takes less than 2 seconds.     Findings: No rash.  Neurological:     General: No focal deficit present.     Mental Status: She is alert and oriented to person, place, and time. Mental status is at baseline.     Cranial Nerves: No dysarthria or facial asymmetry.  Psychiatric:        Mood and Affect: Mood normal.        Speech: Speech normal.        Behavior: Behavior normal.        Thought Content: Thought content normal.        Judgment: Judgment normal.      UC Treatments / Results  Labs (all labs ordered are listed, but only abnormal results are displayed) Labs Reviewed  CULTURE, GROUP A STREP [facility])  POCT RAPID STREP A (OFFICE)    EKG   Radiology No results found.  Procedures Procedures (including critical care  time)  Medications Ordered in UC Medications - No data to display  Initial Impression / Assessment and Plan / UC Course  I have reviewed the triage vital signs and the nursing notes.  Pertinent labs & imaging results that were available during my care of the patient were reviewed by me and considered in my medical decision making (see chart for details).   1.  Viral pharyngitis Evaluation suggests viral pharyngitis etiology.   Group A strep POC testing negative, throat culture pending, will treat based on throat culture results for bacterial pharyngitis if indicated.  Low suspicion for mononucleosis, epiglottitis, peritonsillar abscess, etc.  HEENT exam stable without red flag signs. Will manage this conservatively with supportive care as outlined in AVS.  Salt water  gargles as needed, warm water  with honey.    Counseled patient on potential for adverse effects with medications prescribed/recommended today, strict ER and return-to-clinic precautions discussed, patient verbalized understanding.    Final Clinical Impressions(s) / UC Diagnoses   Final diagnoses:  Viral pharyngitis     Discharge Instructions      Strep test in the clinic is negative, staff will call you if the throat culture is positive for bacteria in the next 2-3 days and call in treatment if necessary based on result.   For now, we will treat this as a viral infection with the following: - Over the counter medicines as needed for pain and swelling of the throat (tylenol  1,00mg  every 6 hours as needed). - 1 tablespoon of honey in warm water  and/or salt water  gargles every 3-4 hours  Seek medical care if you develop changes to your voice, inability to swallow, drooling, or any new symptoms. If symptoms are severe, please go to the ER. I hope you feel better!     ED Prescriptions   None    PDMP not reviewed this encounter.   Starlene Eaton, [place] 10/25/23 1248

## 2023-10-24 NOTE — ED Triage Notes (Signed)
 Pt presents for strep throat testing. Pt daughter tested positive for strep. Denies any symptoms

## 2023-10-27 ENCOUNTER — Ambulatory Visit (HOSPITAL_COMMUNITY): Payer: Self-pay

## 2023-10-27 LAB — CULTURE, GROUP A STREP (THRC)

## 2023-11-03 ENCOUNTER — Other Ambulatory Visit: Payer: Self-pay | Admitting: Cardiology

## 2023-11-05 ENCOUNTER — Telehealth: Payer: Self-pay

## 2023-11-05 NOTE — Telephone Encounter (Signed)
 Auth Submission: APPROVED Site of care: Site of care: [facility] Payer: BCBS Medication & CPT/J Code(s) submitted: Orencia  (Abatacept ) Z6109 Route of submission (phone, fax, portal): [facility] Phone # Fax # Auth type: Buy/Bill PB Units/visits requested: x 14 visits Reference number: [id number] / Case # with [facility] for renewals [id number] Approval from: 06/08/23 to 06/07/24

## 2023-11-08 ENCOUNTER — Encounter (HOSPITAL_COMMUNITY)
Admission: RE | Admit: 2023-11-08 | Discharge: 2023-11-08 | Disposition: A | Discharge status: Home / Self Care | Source: Ambulatory Visit | Attending: Rheumatology | Admitting: Rheumatology

## 2023-11-08 DIAGNOSIS — Z79899 Other long term (current) drug therapy: Secondary | ICD-10-CM | POA: Diagnosis present

## 2023-11-08 DIAGNOSIS — M0579 Rheumatoid arthritis with rheumatoid factor of multiple sites without organ or systems involvement: Secondary | ICD-10-CM | POA: Diagnosis present

## 2023-11-08 MED ORDER — ACETAMINOPHEN 325 MG PO TABS
650.0000 mg | ORAL_TABLET | Freq: Once | ORAL | Status: AC
  Administered 2023-11-08: 650 mg via ORAL

## 2023-11-08 MED ORDER — DIPHENHYDRAMINE HCL 25 MG PO CAPS
25.0000 mg | ORAL_CAPSULE | Freq: Once | ORAL | Status: AC
  Administered 2023-11-08: 25 mg via ORAL

## 2023-11-08 MED ORDER — ACETAMINOPHEN 325 MG PO TABS
ORAL_TABLET | ORAL | Status: AC
  Filled 2023-11-08: qty 2

## 2023-11-08 MED ORDER — SODIUM CHLORIDE 0.9 % IV SOLN
1000.0000 mg | INTRAVENOUS | Status: DC
  Administered 2023-11-08: 1000 mg via INTRAVENOUS
  Filled 2023-11-08: qty 40

## 2023-11-08 MED ORDER — DIPHENHYDRAMINE HCL 25 MG PO CAPS
ORAL_CAPSULE | ORAL | Status: AC
  Filled 2023-11-08: qty 1

## 2023-11-19 ENCOUNTER — Other Ambulatory Visit: Payer: Self-pay | Admitting: Physician Assistant

## 2023-11-21 NOTE — Telephone Encounter (Signed)
 Last Fill: 08/22/2023  Next Visit: 01/24/2024  Last Visit: 08/17/2023  Dx: Fibromyalgia   Current Dose per office note on 08/17/2023: not discussed  Okay to refill Lidoderm ?

## 2023-12-03 ENCOUNTER — Other Ambulatory Visit: Payer: Self-pay | Admitting: Physician Assistant

## 2023-12-05 NOTE — Telephone Encounter (Signed)
 Last Fill: 08/17/2023  Next Visit: 01/24/2024  Last Visit: 08/17/2023  Dx: Fibromyalgia   Current Dose per office note on 08/17/2023: not discussed  Okay to refill Gabapentin ?

## 2023-12-06 ENCOUNTER — Encounter (HOSPITAL_COMMUNITY)

## 2023-12-06 ENCOUNTER — Telehealth: Payer: Self-pay

## 2023-12-06 DIAGNOSIS — M0579 Rheumatoid arthritis with rheumatoid factor of multiple sites without organ or systems involvement: Secondary | ICD-10-CM

## 2023-12-06 DIAGNOSIS — Z79899 Other long term (current) drug therapy: Secondary | ICD-10-CM

## 2023-12-06 NOTE — Telephone Encounter (Signed)
 Received letter from insurance that Rasuvo  no longer covered as of 12/06/23  Submitted a Prior Authorization request to [facility] for OTREXUP  via CoverMyMeds. Pending clinical quetsions to populate  Key: [id number]   Sherry Pennant, PharmD, MPH, BCPS, CPP Clinical Pharmacist (Rheumatology and Pulmonology)

## 2023-12-08 ENCOUNTER — Encounter (HOSPITAL_COMMUNITY)
Admission: RE | Admit: 2023-12-08 | Discharge: 2023-12-08 | Disposition: A | Discharge status: Home / Self Care | Source: Ambulatory Visit | Attending: Rheumatology | Admitting: Rheumatology

## 2023-12-08 ENCOUNTER — Ambulatory Visit: Payer: Self-pay | Admitting: Rheumatology

## 2023-12-08 DIAGNOSIS — Z79899 Other long term (current) drug therapy: Secondary | ICD-10-CM | POA: Diagnosis present

## 2023-12-08 DIAGNOSIS — M0579 Rheumatoid arthritis with rheumatoid factor of multiple sites without organ or systems involvement: Secondary | ICD-10-CM | POA: Insufficient documentation

## 2023-12-08 LAB — COMPREHENSIVE METABOLIC PANEL WITH GFR
ALT: 16 U/L (ref 0–44)
AST: 25 U/L (ref 15–41)
Albumin: 3.7 g/dL (ref 3.5–5.0)
Alkaline Phosphatase: 73 U/L (ref 38–126)
Anion gap: 10 (ref 5–15)
BUN: 13 mg/dL (ref 6–20)
CO2: 24 mmol/L (ref 22–32)
Calcium: 9.3 mg/dL (ref 8.9–10.3)
Chloride: 107 mmol/L (ref 98–111)
Creatinine, Ser: 0.93 mg/dL (ref 0.44–1.00)
GFR, Estimated: >60 mL/min (ref >60)
Glucose, Bld: 87 mg/dL (ref 70–99)
Potassium: 4.2 mmol/L (ref 3.5–5.1)
Sodium: 141 mmol/L (ref 135–145)
Total Bilirubin: 0.8 mg/dL (ref 0.0–1.2)
Total Protein: 7.3 g/dL (ref 6.5–8.1)

## 2023-12-08 LAB — CBC WITH DIFFERENTIAL/PLATELET
Abs Immature Granulocytes: 0.02 10*3/uL (ref 0.00–0.07)
Basophils Absolute: 0.1 10*3/uL (ref 0.0–0.1)
Basophils Relative: 1 %
Eosinophils Absolute: 0.2 10*3/uL (ref 0.0–0.5)
Eosinophils Relative: 2 %
HCT: 47.7 % — ABNORMAL HIGH (ref 36.0–46.0)
Hemoglobin: 14.8 g/dL (ref 12.0–15.0)
Immature Granulocytes: 0 %
Lymphocytes Relative: 51 %
Lymphs Abs: 4.7 10*3/uL — ABNORMAL HIGH (ref 0.7–4.0)
MCH: 26.6 pg (ref 26.0–34.0)
MCHC: 31 g/dL (ref 30.0–36.0)
MCV: 85.6 fL (ref 80.0–100.0)
Monocytes Absolute: 0.5 10*3/uL (ref 0.1–1.0)
Monocytes Relative: 5 %
Neutro Abs: 3.7 10*3/uL (ref 1.7–7.7)
Neutrophils Relative %: 41 %
Platelets: 273 10*3/uL (ref 150–400)
RBC: 5.57 MIL/uL — ABNORMAL HIGH (ref 3.87–5.11)
RDW: 12.7 % (ref 11.5–15.5)
WBC: 9.2 10*3/uL (ref 4.0–10.5)
nRBC: 0 % (ref 0.0–0.2)

## 2023-12-08 MED ORDER — ACETAMINOPHEN 325 MG PO TABS
ORAL_TABLET | ORAL | Status: AC
  Filled 2023-12-08: qty 2

## 2023-12-08 MED ORDER — SODIUM CHLORIDE 0.9 % IV SOLN
1000.0000 mg | INTRAVENOUS | Status: DC
  Administered 2023-12-08: 1000 mg via INTRAVENOUS
  Filled 2023-12-08: qty 40

## 2023-12-08 MED ORDER — DIPHENHYDRAMINE HCL 25 MG PO CAPS
ORAL_CAPSULE | ORAL | Status: AC
Start: 2023-12-08 — End: 2023-12-08
  Filled 2023-12-08: qty 1

## 2023-12-08 MED ORDER — DIPHENHYDRAMINE HCL 25 MG PO CAPS
25.0000 mg | ORAL_CAPSULE | Freq: Once | ORAL | Status: DC
  Administered 2023-12-08: 25 mg via ORAL

## 2023-12-08 MED ORDER — ACETAMINOPHEN 325 MG PO TABS
650.0000 mg | ORAL_TABLET | Freq: Once | ORAL | Status: DC
  Administered 2023-12-08: 650 mg via ORAL

## 2023-12-08 NOTE — Progress Notes (Signed)
 CBC and CMP are stable.

## 2023-12-12 MED ORDER — OTREXUP 25 MG/0.4ML ~~LOC~~ SOAJ: 25.0000 mg | SUBCUTANEOUS | 2 refills | Status: DC

## 2023-12-12 NOTE — Telephone Encounter (Signed)
 Received notification from [facility] regarding a prior authorization for OTREXUP . Authorization has been APPROVED from 12/07/2023 to 12/06/2024. Approval letter sent to scan center.  Patient must fill through [facility]: [phone]  Authorization # [id number]  Otrexup  copay card: BIN# [id number] PCN# [id number] GRP# [id number] ID# [id number]    Rx sent to [facility]. Patient notified via MyChart of next steps  Sherry Pennant, PharmD, MPH, BCPS, CPP Clinical Pharmacist (Rheumatology and Pulmonology)

## 2024-01-05 ENCOUNTER — Encounter (HOSPITAL_COMMUNITY)

## 2024-01-10 ENCOUNTER — Encounter (HOSPITAL_COMMUNITY)

## 2024-01-11 ENCOUNTER — Other Ambulatory Visit: Payer: Self-pay | Admitting: Pharmacist

## 2024-01-11 ENCOUNTER — Encounter (HOSPITAL_COMMUNITY)
Admission: RE | Admit: 2024-01-11 | Discharge: 2024-01-11 | Disposition: A | Discharge status: Home / Self Care | Source: Ambulatory Visit | Attending: Rheumatology | Admitting: Rheumatology

## 2024-01-11 DIAGNOSIS — M0579 Rheumatoid arthritis with rheumatoid factor of multiple sites without organ or systems involvement: Secondary | ICD-10-CM | POA: Insufficient documentation

## 2024-01-11 DIAGNOSIS — Z79899 Other long term (current) drug therapy: Secondary | ICD-10-CM | POA: Diagnosis not present

## 2024-01-11 MED ORDER — ACETAMINOPHEN 325 MG PO TABS
ORAL_TABLET | ORAL | Status: AC
  Filled 2024-01-11: qty 2

## 2024-01-11 MED ORDER — ACETAMINOPHEN 325 MG PO TABS
650.0000 mg | ORAL_TABLET | Freq: Once | ORAL | Status: AC
  Administered 2024-01-11: 650 mg via ORAL

## 2024-01-11 MED ORDER — SODIUM CHLORIDE 0.9 % IV SOLN
1000.0000 mg | INTRAVENOUS | Status: DC
  Administered 2024-01-11: 1000 mg via INTRAVENOUS
  Filled 2024-01-11: qty 40

## 2024-01-11 MED ORDER — DIPHENHYDRAMINE HCL 25 MG PO CAPS
25.0000 mg | ORAL_CAPSULE | Freq: Once | ORAL | Status: AC
  Administered 2024-01-11: 25 mg via ORAL

## 2024-01-11 MED ORDER — DIPHENHYDRAMINE HCL 25 MG PO CAPS
ORAL_CAPSULE | ORAL | Status: AC
  Filled 2024-01-11: qty 1

## 2024-01-11 NOTE — Progress Notes (Signed)
 Next infusion for Orencia  IV (G9870) scheduled on 02/08/2024 and due for updated orders. Diagnosis: RA  Dose: 1000mg  every 28 days (appropriate based on last recorded weight of 118.2kg)  Last Clinic Visit: 08/17/2023 Next Clinic Visit: not scheduled; MyChart message sent to pt  Last infusion: 01/11/2024  Labs: CBC and CMP on 12/08/2023 TB Gold: negative on 09/14/2023   Orders placed for Orencia  IV (J0129) x 3 doses along with premedication of acetaminophen  650mg  p.o. and diphenhydramine  25mg  p.o. to be administered 30 minutes before medication infusion.  Standing CBC with diff/platelet and CMP with GFR orders placed to be drawn every 2 months.  Next TB gold due April 2026  Sherry Pennant, PharmD, MPH, BCPS, CPP Clinical Pharmacist (Rheumatology and Pulmonology)

## 2024-01-21 ENCOUNTER — Other Ambulatory Visit: Payer: Self-pay | Admitting: Physician Assistant

## 2024-01-24 ENCOUNTER — Ambulatory Visit: Admitting: Rheumatology

## 2024-01-27 ENCOUNTER — Encounter: Payer: Self-pay | Admitting: Pharmacist

## 2024-01-29 ENCOUNTER — Other Ambulatory Visit: Payer: Self-pay | Admitting: Physician Assistant

## 2024-01-30 NOTE — Telephone Encounter (Signed)
 Last Fill: 08/17/2023  Next Visit: Due around 01/17/2024 . Message sent to the front to schedule.   Last Visit: 08/17/2023  Dx: Rheumatoid arthritis involving multiple sites with positive rheumatoid factor   Current Dose per office note on 08/17/2023: dose not mentioned  Okay to refill Flexeril ?

## 2024-01-30 NOTE — Telephone Encounter (Signed)
 Please schedule patient a follow up visit. Patient due around 01/17/2024 . Thanks!

## 2024-02-07 NOTE — Progress Notes (Deleted)
 Office Visit Note  Patient: [name] [name]             Date of Birth: Oct 08, 1965           MRN: [id number]             PCP: Loris Elsie PARAS, PA-C Referring: Alvie Catheryn POUR, MD Visit Date: 02/10/2024 Occupation: @GUAROCC @  Subjective:  No chief complaint on file.   History of Present Illness: [name] [name] is a 58 y.o. female ***     Activities of Daily Living:  Patient reports morning stiffness for *** {minute/hour:19697}.   Patient {ACTIONS;DENIES/REPORTS:21021675::Denies} nocturnal pain.  Difficulty dressing/grooming: {ACTIONS;DENIES/REPORTS:21021675::Denies} Difficulty climbing stairs: {ACTIONS;DENIES/REPORTS:21021675::Denies} Difficulty getting out of chair: {ACTIONS;DENIES/REPORTS:21021675::Denies} Difficulty using hands for taps, buttons, cutlery, and/or writing: {ACTIONS;DENIES/REPORTS:21021675::Denies}  No Rheumatology ROS completed.   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   Dyslipidemia 01/30/2015   MDD (major depressive disorder), recurrent severe, 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   Essential hypertension 08/25/2014   Morbid obesity (HCC) 08/25/2014   CAD (coronary artery disease), native coronary artery 08/24/2014   Numbness and tingling in left arm    Arm numbness left 08/22/2014   Chest pain 08/22/2014   Gastroesophageal reflux disease without esophagitis 07/23/2014    Coronary artery calcification seen on CAT scan 02/19/2014   Hip pain 10/23/2013   Solitary pulmonary nodule 08/28/2013   Chest pain, atypical 07/18/2013   Heart palpitations 06/27/2013   Cough 06/04/2013   Diastolic dysfunction 05/18/2013   SOB (shortness of breath) 05/09/2013   Other malaise and fatigue 01/21/2013   Stress and adjustment reaction 12/09/2012   Right sided abdominal pain 11/29/2012   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/21/2012   Morbid obesity with BMI of 50.0-59.9, adult (HCC) 11/21/2012   Arthritis 07/13/2012   Depression 07/13/2012   Routine health maintenance 05/07/2012   Migraine 06/07/1998    Past Medical History:  Diagnosis Date   Anxiety    Bursitis    CAD (coronary artery disease), native coronary artery 04/2023   coronary Ca score of 289 with <25% stenosis of the prox to mid LAD and prox LCx.   Depression    Diastolic dysfunction    Dyslipidemia 01/30/2015   Esophageal ring    Fibromyalgia    Hiatal hernia    HSV-1 infection    Morbid obesity (HCC)    Osteoarthritis    Rheumatoid arthritis(714.0)    M05.79   Rheumatoid arthritis, seropositive, 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HCC)    Treated with Orencia , TB neg 09/12/2015   Spondylolysis     Family History  Problem Relation Age of Onset   Hyperlipidemia Mother    Depression Mother    Sarcoidosis Father    Lung disease Father        Pleural Mesothelioma   Cancer Father    Hypertension Maternal Grandmother    Diabetes Maternal Grandmother    Heart attack Paternal Grandmother    Hypertension Paternal Grandmother  Stroke Neg Hx    Colon cancer Neg Hx    Esophageal cancer Neg Hx    Rectal cancer Neg Hx    Stomach cancer Neg Hx    Past Surgical History:  Procedure Laterality Date   CARDIAC CATHETERIZATION N/A 06/11/2016   Procedure: Left Heart Cath and Coronary Angiography;  Surgeon: Candyce GORMAN Reek, MD;  Location: [facility] INVASIVE CV LAB;  Service: Cardiovascular;  Laterality: N/A;    CESAREAN SECTION     COLONOSCOPY  07/27/2017   ESOPHAGEAL MANOMETRY N/A 10/28/2014   Procedure: ESOPHAGEAL MANOMETRY (EM);  Surgeon: Norleen LOISE Kiang, MD;  Location: [facility] ENDOSCOPY;  Service: Endoscopy;  Laterality: N/A;   HERNIA REPAIR     reports surgery on 3 hernias, with 2 more present   LAPAROSCOPIC GASTRIC SLEEVE RESECTION  11/21/2012   [facility]   LEFT HEART CATHETERIZATION WITH CORONARY ANGIOGRAM N/A 08/26/2014   Procedure: LEFT HEART CATHETERIZATION WITH CORONARY ANGIOGRAM;  Surgeon: Dorn JINNY Lesches, MD;  Location: [facility] CATH LAB;  Service: Cardiovascular;  Laterality: N/A;   SHOULDER SURGERY Left 10/04/2022   TUBAL LIGATION     Social History   Social History Narrative   Marital Status: Married Probation officer)    Children:  Son Cathy) Daughter Rodena)    Pets: None    Living Situation: Lives with husband and children    Occupation: Occupational psychologist Administrator)     Education: Oncologist (Psychology)     Tobacco Use/Exposure:  None    Alcohol  Use:  Occasional   Drug Use:  None   Diet:  Regular   Exercise: Walking or Treadmill (2 x week)   Hobbies: Clinical cytogeneticist, [employer] Decorations.               Immunization History  Administered Date(s) Administered   PFIZER(Purple Top)SARS-COV-2 Vaccination 08/30/2019, 09/20/2019, 06/05/2020   Tdap 09/21/2016     Objective: Vital Signs: There were no vitals taken for this visit.   Physical Exam   Musculoskeletal Exam: ***  CDAI Exam: CDAI Score: -- Patient Global: --; Provider Global: -- Swollen: --; Tender: -- Joint Exam 02/10/2024   No joint exam has been documented for this visit   There is currently no information documented on the homunculus. Go to the Rheumatology activity and complete the homunculus joint exam.  Investigation: No additional findings.  Imaging: No results found.  Recent Labs: Lab Results  Component Value Date   WBC 9.2 12/08/2023   HGB 14.8 12/08/2023   PLT 273  12/08/2023   NA 141 12/08/2023   K 4.2 12/08/2023   CL 107 12/08/2023   CO2 24 12/08/2023   GLUCOSE 87 12/08/2023   BUN 13 12/08/2023   CREATININE 0.93 12/08/2023   BILITOT 0.8 12/08/2023   ALKPHOS 73 12/08/2023   AST 25 12/08/2023   ALT 16 12/08/2023   PROT 7.3 12/08/2023   ALBUMIN 3.7 12/08/2023   CALCIUM  9.3 12/08/2023   GFRAA >60 12/26/2019   QFTBGOLD Negative 09/01/2016   QFTBGOLDPLUS Negative 09/14/2023    Speciality Comments: ORENCIA  1000 mg x 4 weeks  Procedures:  No procedures performed Allergies: Influenza vaccines, Influenza virus vaccine, Amitriptyline, and Isosorbide    Assessment / Plan:     Visit Diagnoses: Rheumatoid arthritis involving multiple sites with positive rheumatoid factor (HCC)  High risk medication use  Chronic left shoulder pain  Primary osteoarthritis of both knees  Trochanteric bursitis of both hips  Fibromyalgia  Other fatigue  Other insomnia  History of hypertension  Dyslipidemia  History  of coronary artery disease  Solitary pulmonary nodule  History of diastolic dysfunction  History of depression  S/P laparoscopic sleeve gastrectomy  History of vitamin D  deficiency  Orders: No orders of the defined types were placed in this encounter.  No orders of the defined types were placed in this encounter.   Face-to-face time spent with patient was *** minutes. Greater than 50% of time was spent in counseling and coordination of care.  Follow-Up Instructions: No follow-ups on file.   Waddell CHRISTELLA Craze, PA-C  Note - This record has been created using Dragon software.  Chart creation errors have been sought, but may not always  have been located. Such creation errors do not reflect on  the standard of medical care.

## 2024-02-08 ENCOUNTER — Ambulatory Visit: Payer: Self-pay | Admitting: Physician Assistant

## 2024-02-08 ENCOUNTER — Encounter (HOSPITAL_COMMUNITY)
Admission: RE | Admit: 2024-02-08 | Discharge: 2024-02-08 | Disposition: A | Discharge status: Home / Self Care | Source: Ambulatory Visit | Attending: Rheumatology | Admitting: Rheumatology

## 2024-02-08 DIAGNOSIS — Z79899 Other long term (current) drug therapy: Secondary | ICD-10-CM | POA: Diagnosis present

## 2024-02-08 DIAGNOSIS — M0579 Rheumatoid arthritis with rheumatoid factor of multiple sites without organ or systems involvement: Secondary | ICD-10-CM | POA: Insufficient documentation

## 2024-02-08 LAB — CBC WITH DIFFERENTIAL/PLATELET
Abs Immature Granulocytes: 0.05 K/uL (ref 0.00–0.07)
Basophils Absolute: 0.1 K/uL (ref 0.0–0.1)
Basophils Relative: 0 %
Eosinophils Absolute: 0.1 K/uL (ref 0.0–0.5)
Eosinophils Relative: 1 %
HCT: 38.7 % (ref 36.0–46.0)
Hemoglobin: 12 g/dL (ref 12.0–15.0)
Immature Granulocytes: 0 %
Lymphocytes Relative: 28 %
Lymphs Abs: 3.4 K/uL (ref 0.7–4.0)
MCH: 27 pg (ref 26.0–34.0)
MCHC: 31 g/dL (ref 30.0–36.0)
MCV: 87 fL (ref 80.0–100.0)
Monocytes Absolute: 1.4 K/uL — ABNORMAL HIGH (ref 0.1–1.0)
Monocytes Relative: 12 %
Neutro Abs: 7.1 K/uL (ref 1.7–7.7)
Neutrophils Relative %: 59 %
Platelets: 268 K/uL (ref 150–400)
RBC: 4.45 MIL/uL (ref 3.87–5.11)
RDW: 12.6 % (ref 11.5–15.5)
WBC: 12.1 K/uL — ABNORMAL HIGH (ref 4.0–10.5)
nRBC: 0 % (ref 0.0–0.2)

## 2024-02-08 MED ORDER — SODIUM CHLORIDE 0.9 % IV SOLN
1000.0000 mg | INTRAVENOUS | Status: DC
  Administered 2024-02-08: 1000 mg via INTRAVENOUS
  Filled 2024-02-08: qty 40

## 2024-02-08 MED ORDER — ACETAMINOPHEN 325 MG PO TABS
ORAL_TABLET | ORAL | Status: AC
  Filled 2024-02-08: qty 2

## 2024-02-08 MED ORDER — DIPHENHYDRAMINE HCL 25 MG PO CAPS
25.0000 mg | ORAL_CAPSULE | ORAL | Status: DC
  Administered 2024-02-08: 25 mg via ORAL

## 2024-02-08 MED ORDER — DIPHENHYDRAMINE HCL 25 MG PO CAPS
ORAL_CAPSULE | ORAL | Status: AC
Start: 2024-02-08 — End: 2024-02-08
  Filled 2024-02-08: qty 1

## 2024-02-08 MED ORDER — ACETAMINOPHEN 325 MG PO TABS
650.0000 mg | ORAL_TABLET | ORAL | Status: DC
  Administered 2024-02-08: 650 mg via ORAL

## 2024-02-08 NOTE — Progress Notes (Signed)
 WBC count is borderline elevated-12.1 and absolute monocytes are borderline elevated. Rest of CBC WNL.

## 2024-02-10 ENCOUNTER — Ambulatory Visit: Admitting: Physician Assistant

## 2024-02-10 DIAGNOSIS — Z9884 Bariatric surgery status: Secondary | ICD-10-CM

## 2024-02-10 DIAGNOSIS — G4709 Other insomnia: Secondary | ICD-10-CM

## 2024-02-10 DIAGNOSIS — M797 Fibromyalgia: Secondary | ICD-10-CM

## 2024-02-10 DIAGNOSIS — Z8639 Personal history of other endocrine, nutritional and metabolic disease: Secondary | ICD-10-CM

## 2024-02-10 DIAGNOSIS — Z8659 Personal history of other mental and behavioral disorders: Secondary | ICD-10-CM

## 2024-02-10 DIAGNOSIS — Z8679 Personal history of other diseases of the circulatory system: Secondary | ICD-10-CM

## 2024-02-10 DIAGNOSIS — E785 Hyperlipidemia, unspecified: Secondary | ICD-10-CM

## 2024-02-10 DIAGNOSIS — R5383 Other fatigue: Secondary | ICD-10-CM

## 2024-02-10 DIAGNOSIS — G8929 Other chronic pain: Secondary | ICD-10-CM

## 2024-02-10 DIAGNOSIS — Z79899 Other long term (current) drug therapy: Secondary | ICD-10-CM

## 2024-02-10 DIAGNOSIS — M0579 Rheumatoid arthritis with rheumatoid factor of multiple sites without organ or systems involvement: Secondary | ICD-10-CM

## 2024-02-10 DIAGNOSIS — M170 Bilateral primary osteoarthritis of knee: Secondary | ICD-10-CM

## 2024-02-10 DIAGNOSIS — M7061 Trochanteric bursitis, right hip: Secondary | ICD-10-CM

## 2024-02-10 DIAGNOSIS — R911 Solitary pulmonary nodule: Secondary | ICD-10-CM

## 2024-02-17 DIAGNOSIS — M0579 Rheumatoid arthritis with rheumatoid factor of multiple sites without organ or systems involvement: Secondary | ICD-10-CM | POA: Insufficient documentation

## 2024-02-28 ENCOUNTER — Other Ambulatory Visit: Payer: Self-pay | Admitting: Physician Assistant

## 2024-02-28 NOTE — Telephone Encounter (Signed)
 Last Fill: Gabapentin  12/05/2023 Lidocaine  Patches 11/21/2023  Next Visit: 03/09/2024  Last Visit: 08/17/2023  Dx: Fibromyalgia   Current Dose per office note on 08/17/2023: not mentioned  Okay to refill Gabapentin  and Lidocaine  Patches?

## 2024-03-07 ENCOUNTER — Encounter (HOSPITAL_COMMUNITY)
Admission: RE | Admit: 2024-03-07 | Discharge: 2024-03-07 | Disposition: A | Discharge status: Home / Self Care | Source: Ambulatory Visit | Attending: Rheumatology | Admitting: Rheumatology

## 2024-03-07 VITALS — BP 105/53 | HR 69 | Temp 97.9°F | Resp 16

## 2024-03-07 DIAGNOSIS — M0579 Rheumatoid arthritis with rheumatoid factor of multiple sites without organ or systems involvement: Secondary | ICD-10-CM | POA: Diagnosis present

## 2024-03-07 MED ORDER — DIPHENHYDRAMINE HCL 25 MG PO CAPS
ORAL_CAPSULE | ORAL | Status: AC
  Filled 2024-03-07: qty 1

## 2024-03-07 MED ORDER — SODIUM CHLORIDE 0.9 % IV SOLN
1000.0000 mg | Freq: Once | INTRAVENOUS | Status: AC
  Administered 2024-03-07: 1000 mg via INTRAVENOUS
  Filled 2024-03-07: qty 40

## 2024-03-07 MED ORDER — DIPHENHYDRAMINE HCL 25 MG PO CAPS
25.0000 mg | ORAL_CAPSULE | Freq: Once | ORAL | Status: AC
  Administered 2024-03-07: 25 mg via ORAL

## 2024-03-07 MED ORDER — ACETAMINOPHEN 325 MG PO TABS
ORAL_TABLET | ORAL | Status: AC
Start: 2024-03-07 — End: 2024-03-07
  Filled 2024-03-07: qty 2

## 2024-03-07 MED ORDER — ACETAMINOPHEN 325 MG PO TABS
650.0000 mg | ORAL_TABLET | Freq: Once | ORAL | Status: AC
  Administered 2024-03-07: 650 mg via ORAL

## 2024-03-09 ENCOUNTER — Encounter: Payer: Self-pay | Admitting: Physician Assistant

## 2024-03-09 ENCOUNTER — Ambulatory Visit: Attending: Physician Assistant | Admitting: Physician Assistant

## 2024-03-09 ENCOUNTER — Other Ambulatory Visit: Payer: Self-pay

## 2024-03-09 ENCOUNTER — Other Ambulatory Visit: Payer: Self-pay | Admitting: Physician Assistant

## 2024-03-09 VITALS — BP 122/88 | HR 74 | Temp 97.6°F | Resp 14 | Ht 62.0 in | Wt 238.0 lb

## 2024-03-09 DIAGNOSIS — R5383 Other fatigue: Secondary | ICD-10-CM

## 2024-03-09 DIAGNOSIS — M7062 Trochanteric bursitis, left hip: Secondary | ICD-10-CM

## 2024-03-09 DIAGNOSIS — M170 Bilateral primary osteoarthritis of knee: Secondary | ICD-10-CM | POA: Diagnosis not present

## 2024-03-09 DIAGNOSIS — E785 Hyperlipidemia, unspecified: Secondary | ICD-10-CM

## 2024-03-09 DIAGNOSIS — Z79899 Other long term (current) drug therapy: Secondary | ICD-10-CM

## 2024-03-09 DIAGNOSIS — M0579 Rheumatoid arthritis with rheumatoid factor of multiple sites without organ or systems involvement: Secondary | ICD-10-CM

## 2024-03-09 DIAGNOSIS — Z8659 Personal history of other mental and behavioral disorders: Secondary | ICD-10-CM

## 2024-03-09 DIAGNOSIS — G4709 Other insomnia: Secondary | ICD-10-CM

## 2024-03-09 DIAGNOSIS — Z8639 Personal history of other endocrine, nutritional and metabolic disease: Secondary | ICD-10-CM

## 2024-03-09 DIAGNOSIS — R911 Solitary pulmonary nodule: Secondary | ICD-10-CM

## 2024-03-09 DIAGNOSIS — M7061 Trochanteric bursitis, right hip: Secondary | ICD-10-CM

## 2024-03-09 DIAGNOSIS — M797 Fibromyalgia: Secondary | ICD-10-CM

## 2024-03-09 DIAGNOSIS — M25512 Pain in left shoulder: Secondary | ICD-10-CM | POA: Diagnosis not present

## 2024-03-09 DIAGNOSIS — Z9884 Bariatric surgery status: Secondary | ICD-10-CM

## 2024-03-09 DIAGNOSIS — G8929 Other chronic pain: Secondary | ICD-10-CM

## 2024-03-09 DIAGNOSIS — Z8679 Personal history of other diseases of the circulatory system: Secondary | ICD-10-CM

## 2024-03-09 MED ORDER — OTREXUP 25 MG/0.4ML ~~LOC~~ SOAJ: 25.0000 mg | SUBCUTANEOUS | 2 refills | Status: DC

## 2024-03-09 NOTE — Telephone Encounter (Signed)
 Patient seen in office today. Please review and sign.   Patient states she also needs more Orencia  authorizations. Advised the patient I would send a message to the pharmacy team, please advise.

## 2024-03-09 NOTE — Telephone Encounter (Signed)
 Orencia  infusions are ordered through Infusion Navigator tab. Thank you  Sherry Pennant, PharmD, MPH, BCPS, CPP Clinical Pharmacist [facility])

## 2024-03-09 NOTE — Progress Notes (Signed)
 Office Visit Note  Patient: [name] [name]             Date of Birth: 1965/09/27           MRN: [id number]             PCP: Loris Elsie PARAS, PA-C Referring: Alvie Catheryn POUR, MD Visit Date: 03/09/2024 Occupation: @GUAROCC @  Subjective:  Fibromyalgia flare   History of Present Illness: FLORNCE [name] is a 58 y.o. female with history of rheumatoid arthritis.  She remains on IV Orencia  infusions once monthly and methotrexate  25 mg sq injections once weekly.  She denies any recent gaps in therapy.  She denies any recent or recurrent rheumatoid arthritis flares.  She denies any joint swelling at this time.  Patient states that she is currently experiencing a fibromyalgia flare.  Patient states that her flares are typically exacerbated by stress and weather change.  Patient would like a new referral to integrative therapies. Patient states that she was diagnosed with COVID-19 in early September 2025.  She denies any other recurrent infections.   Activities of Daily Living:  Patient reports morning stiffness for 1 hour.   Patient Reports nocturnal pain.  Difficulty dressing/grooming: Reports Difficulty climbing stairs: Reports Difficulty getting out of chair: Reports Difficulty using hands for taps, buttons, cutlery, and/or writing: Reports  Review of Systems  Constitutional:  Negative for fatigue.  HENT:  Positive for mouth dryness. Negative for mouth sores.   Eyes:  Positive for dryness.  Respiratory:  Negative for shortness of breath.   Cardiovascular:  Positive for chest pain. Negative for palpitations.  Gastrointestinal:  Negative for blood in stool, constipation and diarrhea.  Endocrine: Negative for increased urination.  Genitourinary:  Negative for involuntary urination.  Musculoskeletal:  Positive for joint pain, joint pain, joint swelling, myalgias, muscle weakness, morning stiffness, muscle tenderness and myalgias. Negative for gait problem.  Skin:  Positive for  color change and sensitivity to sunlight. Negative for rash and hair loss.  Allergic/Immunologic: Positive for susceptible to infections.  Neurological:  Positive for headaches. Negative for dizziness.  Hematological:  Negative for swollen glands.  Psychiatric/Behavioral:  Positive for depressed mood and sleep disturbance. The patient is nervous/anxious.     PMFS History:  Patient Active Problem List   Diagnosis Date Noted   Rheumatoid arthritis involving multiple sites with positive rheumatoid factor (HCC) 02/17/2024   Amenorrhea 06/09/2020   Anxiety 02/20/2018   Fatigue 02/20/2018   Low blood potassium 02/20/2018   Allergic rhinitis 12/14/2017   Fever blister 12/14/2017   Head congestion 12/14/2017   Hyperlipidemia 12/14/2017   Lateral epicondylitis of right elbow 05/04/2017   Sacral pain 03/23/2017   High risk medication use 08/03/2016   Fibromyalgia syndrome 08/03/2016   Abnormal cardiac CT angiography    Sacrococcygeal pain 02/20/2016   Rheumatoid arthritis involving 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HCC) 01/24/2016   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   Dyslipidemia 01/30/2015   MDD (major depressive disorder), recurrent severe, 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   Essential hypertension 08/25/2014   Morbid obesity (HCC) 08/25/2014   CAD (coronary artery disease), native coronary artery 08/24/2014   Numbness and tingling in left arm    Arm numbness left 08/22/2014   Chest pain 08/22/2014   Gastroesophageal reflux disease without esophagitis 07/23/2014   Coronary artery calcification seen on CAT scan 02/19/2014   Hip pain 10/23/2013   Solitary pulmonary nodule 08/28/2013   Chest pain, atypical 07/18/2013   Heart palpitations 06/27/2013   Cough 06/04/2013  Diastolic dysfunction 05/18/2013   SOB (shortness of breath) 05/09/2013   Other malaise and fatigue 01/21/2013   Stress and adjustment reaction 12/09/2012   Right sided abdominal pain 11/29/2012   History of laparoscopic partial  gastrectomy 11/21/2012   Morbid obesity with BMI of 50.0-59.9, adult (HCC) 11/21/2012   Arthritis 07/13/2012   Depression 07/13/2012   Routine health maintenance 05/07/2012   Migraine 06/07/1998    Past Medical History:  Diagnosis Date   Anxiety    Bursitis    CAD (coronary artery disease), native coronary artery 04/2023   coronary Ca score of 289 with <25% stenosis of the prox to mid LAD and prox LCx.   Depression    Diastolic dysfunction    Dyslipidemia 01/30/2015   Esophageal ring    Fibromyalgia    Hiatal hernia    HSV-1 infection    Morbid obesity (HCC)    Osteoarthritis    Rheumatoid arthritis(714.0)    M05.79   Rheumatoid arthritis, seropositive, 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HCC)    Treated with Orencia , TB neg 09/12/2015   Spondylolysis     Family History  Problem Relation Age of Onset   Hyperlipidemia Mother    Depression Mother    Sarcoidosis Father    Lung disease Father        Pleural Mesothelioma   Cancer Father    Hypertension Maternal Grandmother    Diabetes Maternal Grandmother    Heart attack Paternal Grandmother    Hypertension Paternal Grandmother    Stroke Neg Hx    Colon cancer Neg Hx    Esophageal cancer Neg Hx    Rectal cancer Neg Hx    Stomach cancer Neg Hx    Past Surgical History:  Procedure Laterality Date   CARDIAC CATHETERIZATION N/A 06/11/2016   Procedure: Left Heart Cath and Coronary Angiography;  Surgeon: Candyce GORMAN Reek, MD;  Location: [facility] INVASIVE CV LAB;  Service: Cardiovascular;  Laterality: N/A;   CESAREAN SECTION     COLONOSCOPY  07/27/2017   ESOPHAGEAL MANOMETRY N/A 10/28/2014   Procedure: ESOPHAGEAL MANOMETRY (EM);  Surgeon: Norleen LOISE Kiang, MD;  Location: [facility];  Service: Endoscopy;  Laterality: N/A;   HERNIA REPAIR     reports surgery on 3 hernias, with 2 more present   LAPAROSCOPIC GASTRIC SLEEVE RESECTION  11/21/2012   [facility]   LEFT HEART CATHETERIZATION WITH CORONARY ANGIOGRAM N/A 08/26/2014   Procedure: LEFT HEART  CATHETERIZATION WITH CORONARY ANGIOGRAM;  Surgeon: Dorn JINNY Lesches, MD;  Location: [facility] CATH LAB;  Service: Cardiovascular;  Laterality: N/A;   SHOULDER SURGERY Left 10/04/2022   TUBAL LIGATION     Social History   Social History Narrative   Marital Status: Married Probation officer)    Children:  Son Cathy) Daughter Rodena)    Pets: None    Living Situation: Lives with husband and children    Occupation: Occupational psychologist Administrator)     Education: Oncologist (Psychology)     Tobacco Use/Exposure:  None    Alcohol  Use:  Occasional   Drug Use:  None   Diet:  Regular   Exercise: Walking or Treadmill (2 x week)   Hobbies: Clinical cytogeneticist, [employer] Decorations.               Immunization History  Administered Date(s) Administered   PFIZER(Purple Top)SARS-COV-2 Vaccination 08/30/2019, 09/20/2019, 06/05/2020   Tdap 09/21/2016     Objective: Vital Signs: BP 122/88   Pulse 74   Temp 97.6 F (36.4 C)   Resp  14   Ht 5' 2 (1.575 m)   Wt 238 lb (108 kg)   BMI 43.53 kg/m    Physical Exam Vitals and nursing note reviewed.  Constitutional:      Appearance: She is well-developed.  HENT:     Head: Normocephalic and atraumatic.  Eyes:     Conjunctiva/sclera: Conjunctivae normal.  Cardiovascular:     Rate and Rhythm: Normal rate and regular rhythm.     Heart sounds: Normal heart sounds.  Pulmonary:     Effort: Pulmonary effort is normal.     Breath sounds: Normal breath sounds.  Abdominal:     General: Bowel sounds are normal.     Palpations: Abdomen is soft.  Musculoskeletal:     Cervical back: Normal range of motion.  Lymphadenopathy:     Cervical: No cervical adenopathy.  Skin:    General: Skin is warm and dry.     Capillary Refill: Capillary refill takes less than 2 seconds.  Neurological:     Mental Status: She is alert and oriented to person, place, and time.  Psychiatric:        Behavior: Behavior normal.      Musculoskeletal Exam: Patient  remained seated during examination today.  C-spine has limited range of motion especially with lateral rotation to the right.  Discomfort and limited range of motion of the left shoulder.  Right shoulder is full range of motion.  Elbow joints, wrist joints, MCPs, PIPs and DIPs have good range of motion with no synovitis.  Complete fist formation bilaterally.  Hip joints have good to assess in seated position.  Knee joints have good range of motion with no warmth or effusion.  Ankle joints have good range of motion with no tenderness or joint swelling.  CDAI Exam: CDAI Score: -- Patient Global: --; Provider Global: -- Swollen: --; Tender: -- Joint Exam 03/09/2024   No joint exam has been documented for this visit   There is currently no information documented on the homunculus. Go to the Rheumatology activity and complete the homunculus joint exam.  Investigation: No additional findings.  Imaging: No results found.  Recent Labs: Lab Results  Component Value Date   WBC 12.1 (H) 02/08/2024   HGB 12.0 02/08/2024   PLT 268 02/08/2024   NA 141 12/08/2023   K 4.2 12/08/2023   CL 107 12/08/2023   CO2 24 12/08/2023   GLUCOSE 87 12/08/2023   BUN 13 12/08/2023   CREATININE 0.93 12/08/2023   BILITOT 0.8 12/08/2023   ALKPHOS 73 12/08/2023   AST 25 12/08/2023   ALT 16 12/08/2023   PROT 7.3 12/08/2023   ALBUMIN 3.7 12/08/2023   CALCIUM  9.3 12/08/2023   GFRAA >60 12/26/2019   QFTBGOLD Negative 09/01/2016   QFTBGOLDPLUS Negative 09/14/2023    Speciality Comments: ORENCIA  1000 mg x 4 weeks  Procedures:  No procedures performed Allergies: Influenza vaccines, Influenza virus vaccine, Amitriptyline, and Isosorbide    Assessment / Plan:     Visit Diagnoses: Rheumatoid arthritis involving multiple sites with positive rheumatoid factor (HCC): She has no synovitis on examination today.  She has not had any signs or symptoms of a rheumatoid arthritis flare.  She has clinically been doing well  on IV Orencia  infusions once monthly and methotrexate  25 mg subcu injections once weekly.  She is tolerating combination therapy without any side effects and has not had any recent gaps in therapy.  No medication changes will be made at this time.  She was  advised  to notify us  if she develops any signs or symptoms of a flare.  She will follow-up in the office in 5 months or sooner if needed.  High risk medication use -  IV Orencia  1,000 mg infusions every 28 days, Rasuvo  25 mg per 0.5 mL subcutaneous weekly, and Folic acid  2 mg daily.  CMP updated on 12/08/23.  Plan to check CMP with GFR today. CBC updated on 02/08/24.  TB gold negative on 09/14/23 Lipid panel updated on 09/26/23  Patient was diagnosed with COVID-19 in early September 2025.  She has not had any other recurrent infections.  Discussed the importance of holding IV Orencia  and methotrexate  if she develops signs or symptoms of infection and to resume once the infection has completely cleared. Plan: Comprehensive metabolic panel with GFR  Chronic left shoulder pain:Under care of Dr. Sharl.  Surgical intervention 10/04/22.  Chronic pain.  Limited range of motion.  Primary osteoarthritis of both knees: MRI of the right knee on 05/23/2020 which was negative for meniscal or ligament tear.  She has good range of motion of both knee joints on examination today.  No warmth or effusion noted.  She uses Voltaren  gel topically as needed for symptomatic relief.  Trochanteric bursitis of both hips: Patient presents today with an exacerbation of pain on the lateral aspect of the right hip.  Patient rrequested a referral to integrative therapies.   Fibromyalgia: She has generalized hyperalgesia and positive tender points on exam.  She remains on gabapentin  and flexeril  as prescribed.   A referral to integrative therapies will be placed today as requested.  Other fatigue: Chronic, stable.  Discussed the importance of regular exercise and good sleep  hygiene.  Other insomnia: Discussed the importance of good sleep hygiene.  Other medical conditions are listed as follows:  History of hypertension: Blood pressure 122/88 today in the office.  Dyslipidemia: Lpid panel updated on 09/26/2023.  History of coronary artery disease  Solitary pulmonary nodule  History of diastolic dysfunction  History of depression  S/P laparoscopic sleeve gastrectomy  History of vitamin D  deficiency  Orders: Orders Placed This Encounter  Procedures   Comprehensive metabolic panel with GFR   No orders of the defined types were placed in this encounter.    Follow-Up Instructions: Return in about 5 months (around 08/07/2024) for Rheumatoid arthritis.   Waddell CHRISTELLA Craze, PA-C  Note - This [name] has been created using Dragon software.  Chart creation errors have been sought, but may not always  have been located. Such creation errors do not reflect on  the standard of medical care.

## 2024-03-12 ENCOUNTER — Telehealth: Payer: Self-pay | Admitting: Pharmacist

## 2024-03-12 DIAGNOSIS — M0579 Rheumatoid arthritis with rheumatoid factor of multiple sites without organ or systems involvement: Secondary | ICD-10-CM

## 2024-03-12 DIAGNOSIS — Z79899 Other long term (current) drug therapy: Secondary | ICD-10-CM

## 2024-03-12 MED ORDER — RASUVO 25 MG/0.5ML ~~LOC~~ SOAJ: 25.0000 mg | SUBCUTANEOUS | 0 refills | Status: AC

## 2024-03-12 NOTE — Telephone Encounter (Signed)
 Per response on CMM: Your PA has been resolved, no additional PA is required. For further inquiries please contact the number on the back of the member prescription card. (Message [phone])   Rx for Rasuvo  25mg  sent to [facility]. MyChart message sent to pt  Sherry Pennant, PharmD, MPH, BCPS, CPP Clinical Pharmacist [facility])

## 2024-03-12 NOTE — Telephone Encounter (Addendum)
 Submitted an URGENT Prior Authorization request to [facility] for RASUVO  via CoverMyMeds. Pending clinical questions to populate  Key: [id number]    ----- Message from Sherry GORMAN Pennant sent at 03/12/2024 10:50 AM EDT ----- Regarding: OTREXUP  -> RASUVO  Patient on Otrexup  which has been discontinued. Please run benefits for Rasuvo  (she has taken this before until insurance recently mandated switch to Otrexup )

## 2024-03-29 ENCOUNTER — Other Ambulatory Visit: Payer: Self-pay | Admitting: Cardiology

## 2024-04-04 ENCOUNTER — Encounter (HOSPITAL_COMMUNITY)
Admission: RE | Admit: 2024-04-04 | Discharge: 2024-04-04 | Disposition: A | Discharge status: Home / Self Care | Source: Ambulatory Visit | Attending: Rheumatology | Admitting: Rheumatology

## 2024-04-04 ENCOUNTER — Ambulatory Visit: Payer: Self-pay | Admitting: Rheumatology

## 2024-04-04 VITALS — BP 129/74 | HR 83 | Temp 98.0°F | Resp 17

## 2024-04-04 DIAGNOSIS — M0579 Rheumatoid arthritis with rheumatoid factor of multiple sites without organ or systems involvement: Secondary | ICD-10-CM

## 2024-04-04 LAB — COMPREHENSIVE METABOLIC PANEL WITH GFR
ALT: 14 U/L (ref 0–44)
AST: 17 U/L (ref 15–41)
Albumin: 3.1 g/dL — ABNORMAL LOW (ref 3.5–5.0)
Alkaline Phosphatase: 67 U/L (ref 38–126)
Anion gap: 14 (ref 5–15)
BUN: 7 mg/dL (ref 6–20)
CO2: 26 mmol/L (ref 22–32)
Calcium: 8.6 mg/dL — ABNORMAL LOW (ref 8.9–10.3)
Chloride: 99 mmol/L (ref 98–111)
Creatinine, Ser: 0.84 mg/dL (ref 0.44–1.00)
GFR, Estimated: >60 mL/min (ref >60)
Glucose, Bld: 85 mg/dL (ref 70–99)
Potassium: 3.8 mmol/L (ref 3.5–5.1)
Sodium: 139 mmol/L (ref 135–145)
Total Bilirubin: 0.3 mg/dL (ref 0.0–1.2)
Total Protein: 6.4 g/dL — ABNORMAL LOW (ref 6.5–8.1)

## 2024-04-04 MED ORDER — DIPHENHYDRAMINE HCL 25 MG PO CAPS
25.0000 mg | ORAL_CAPSULE | Freq: Once | ORAL | Status: AC
  Administered 2024-04-04: 25 mg via ORAL

## 2024-04-04 MED ORDER — DIPHENHYDRAMINE HCL 25 MG PO CAPS
ORAL_CAPSULE | ORAL | Status: AC
  Filled 2024-04-04: qty 1

## 2024-04-04 MED ORDER — SODIUM CHLORIDE 0.9 % IV SOLN
1000.0000 mg | Freq: Once | INTRAVENOUS | Status: AC
  Administered 2024-04-04: 1000 mg via INTRAVENOUS
  Filled 2024-04-04: qty 40

## 2024-04-04 MED ORDER — ACETAMINOPHEN 325 MG PO TABS
650.0000 mg | ORAL_TABLET | Freq: Once | ORAL | Status: AC
  Administered 2024-04-04: 650 mg via ORAL

## 2024-04-04 MED ORDER — ACETAMINOPHEN 325 MG PO TABS
ORAL_TABLET | ORAL | Status: AC
  Filled 2024-04-04: qty 2

## 2024-04-04 NOTE — Progress Notes (Signed)
 CMP unremarkable except shows low calcium  due to low albumin.  No change in treatment advised.

## 2024-04-05 ENCOUNTER — Telehealth: Payer: Self-pay

## 2024-04-05 DIAGNOSIS — G4719 Other hypersomnia: Secondary | ICD-10-CM

## 2024-04-05 DIAGNOSIS — I2510 Atherosclerotic heart disease of native coronary artery without angina pectoris: Secondary | ICD-10-CM

## 2024-04-05 DIAGNOSIS — I10 Essential (primary) hypertension: Secondary | ICD-10-CM

## 2024-04-05 DIAGNOSIS — R002 Palpitations: Secondary | ICD-10-CM

## 2024-04-05 NOTE — Telephone Encounter (Signed)
**Note De-Identified Kenji Mapel Obfuscation** Per the [facility] provider portal, a PA is not required for CPT Code: 04189 (NPSG).  I have transferred the order to the sleep lab.

## 2024-04-28 ENCOUNTER — Other Ambulatory Visit: Payer: Self-pay | Admitting: Cardiology

## 2024-05-02 ENCOUNTER — Ambulatory Visit (HOSPITAL_COMMUNITY)
Admission: RE | Admit: 2024-05-02 | Discharge: 2024-05-02 | Disposition: A | Discharge status: Home / Self Care | Source: Ambulatory Visit | Attending: Rheumatology | Admitting: Rheumatology

## 2024-05-02 VITALS — BP 125/89 | HR 78 | Temp 97.8°F | Resp 16 | Wt 235.0 lb

## 2024-05-02 DIAGNOSIS — M0579 Rheumatoid arthritis with rheumatoid factor of multiple sites without organ or systems involvement: Secondary | ICD-10-CM | POA: Insufficient documentation

## 2024-05-02 MED ORDER — SODIUM CHLORIDE 0.9 % IV SOLN
1000.0000 mg | Freq: Once | INTRAVENOUS | Status: AC
  Administered 2024-05-02: 1000 mg via INTRAVENOUS
  Filled 2024-05-02: qty 40

## 2024-05-02 MED ORDER — ACETAMINOPHEN 325 MG PO TABS
ORAL_TABLET | ORAL | Status: AC
  Filled 2024-05-02: qty 2

## 2024-05-02 MED ORDER — DIPHENHYDRAMINE HCL 25 MG PO CAPS
25.0000 mg | ORAL_CAPSULE | Freq: Once | ORAL | Status: AC
  Administered 2024-05-02: 25 mg via ORAL

## 2024-05-02 MED ORDER — DIPHENHYDRAMINE HCL 25 MG PO CAPS
ORAL_CAPSULE | ORAL | Status: AC
  Filled 2024-05-02: qty 1

## 2024-05-02 MED ORDER — ACETAMINOPHEN 325 MG PO TABS
650.0000 mg | ORAL_TABLET | Freq: Once | ORAL | Status: AC
  Administered 2024-05-02: 650 mg via ORAL

## 2024-05-30 ENCOUNTER — Other Ambulatory Visit: Payer: Self-pay | Admitting: Cardiology

## 2024-06-03 ENCOUNTER — Other Ambulatory Visit: Payer: Self-pay | Admitting: Physician Assistant

## 2024-06-04 ENCOUNTER — Telehealth: Payer: Self-pay | Admitting: Rheumatology

## 2024-06-04 NOTE — Telephone Encounter (Signed)
 Last Fill: 02/28/2024  Next Visit: due March 2026, message sent to the front desk to schedule.   Last Visit: 03/09/2024  DX: Fibromyalgia  Current Dose per office note on 03/09/2024: She remains on gabapentin  and flexeril  as prescribed.   Lidocaine  patch not mentioned.   Okay to refill lidocaine  patches and gabapentin ?

## 2024-06-04 NOTE — Telephone Encounter (Signed)
-----   Message from [facility] Marissa G sent at 06/04/2024  9:20 AM EST ----- Please call to schedule follow up appointment. Patient is due March 2026. Thanks!

## 2024-06-04 NOTE — Telephone Encounter (Signed)
 Patient states she will call back to schedule follow-up appointment.

## 2024-06-08 ENCOUNTER — Emergency Department (HOSPITAL_BASED_OUTPATIENT_CLINIC_OR_DEPARTMENT_OTHER)
Admission: EM | Admit: 2024-06-08 | Discharge: 2024-06-08 | Disposition: A | Discharge status: Home / Self Care | Attending: Emergency Medicine | Admitting: Emergency Medicine

## 2024-06-08 ENCOUNTER — Encounter (HOSPITAL_BASED_OUTPATIENT_CLINIC_OR_DEPARTMENT_OTHER): Payer: Self-pay | Admitting: Emergency Medicine

## 2024-06-08 ENCOUNTER — Emergency Department (HOSPITAL_BASED_OUTPATIENT_CLINIC_OR_DEPARTMENT_OTHER): Admitting: Radiology

## 2024-06-08 DIAGNOSIS — Z7982 Long term (current) use of aspirin: Secondary | ICD-10-CM | POA: Diagnosis not present

## 2024-06-08 DIAGNOSIS — R509 Fever, unspecified: Secondary | ICD-10-CM | POA: Diagnosis not present

## 2024-06-08 DIAGNOSIS — R079 Chest pain, unspecified: Secondary | ICD-10-CM | POA: Diagnosis present

## 2024-06-08 DIAGNOSIS — M791 Myalgia, unspecified site: Secondary | ICD-10-CM | POA: Diagnosis not present

## 2024-06-08 DIAGNOSIS — R0602 Shortness of breath: Secondary | ICD-10-CM | POA: Insufficient documentation

## 2024-06-08 DIAGNOSIS — R519 Headache, unspecified: Secondary | ICD-10-CM | POA: Diagnosis not present

## 2024-06-08 LAB — CBC WITH DIFFERENTIAL/PLATELET
Abs Immature Granulocytes: 0.03 K/uL (ref 0.00–0.07)
Basophils Absolute: 0 K/uL (ref 0.0–0.1)
Basophils Relative: 1 %
Eosinophils Absolute: 0.2 K/uL (ref 0.0–0.5)
Eosinophils Relative: 2 %
HCT: 42.1 % (ref 36.0–46.0)
Hemoglobin: 13.2 g/dL (ref 12.0–15.0)
Immature Granulocytes: 0 %
Lymphocytes Relative: 46 %
Lymphs Abs: 4 K/uL (ref 0.7–4.0)
MCH: 26.7 pg (ref 26.0–34.0)
MCHC: 31.4 g/dL (ref 30.0–36.0)
MCV: 85.1 fL (ref 80.0–100.0)
Monocytes Absolute: 0.5 K/uL (ref 0.1–1.0)
Monocytes Relative: 6 %
Neutro Abs: 3.9 K/uL (ref 1.7–7.7)
Neutrophils Relative %: 45 %
Platelets: 297 K/uL (ref 150–400)
RBC: 4.95 MIL/uL (ref 3.87–5.11)
RDW: 12.7 % (ref 11.5–15.5)
WBC: 8.6 K/uL (ref 4.0–10.5)
nRBC: 0 % (ref 0.0–0.2)

## 2024-06-08 LAB — BASIC METABOLIC PANEL WITH GFR
Anion gap: 10 (ref 5–15)
BUN: 17 mg/dL (ref 6–20)
CO2: 25 mmol/L (ref 22–32)
Calcium: 9.4 mg/dL (ref 8.9–10.3)
Chloride: 108 mmol/L (ref 98–111)
Creatinine, Ser: 0.94 mg/dL (ref 0.44–1.00)
GFR, Estimated: >60 mL/min
Glucose, Bld: 92 mg/dL (ref 70–99)
Potassium: 4.1 mmol/L (ref 3.5–5.1)
Sodium: 143 mmol/L (ref 135–145)

## 2024-06-08 LAB — URINALYSIS, ROUTINE W REFLEX MICROSCOPIC
Bilirubin Urine: NEGATIVE
Glucose, UA: NEGATIVE mg/dL
Ketones, ur: NEGATIVE mg/dL
Nitrite: NEGATIVE
Protein, ur: NEGATIVE mg/dL
Specific Gravity, Urine: 1.02 (ref 1.005–1.030)
pH: 6 (ref 5.0–8.0)

## 2024-06-08 LAB — URINALYSIS, MICROSCOPIC (REFLEX): Bacteria, UA: NONE SEEN

## 2024-06-08 LAB — RESP PANEL BY RT-PCR (RSV, FLU A&B, COVID)  RVPGX2
Influenza A by PCR: NEGATIVE
Influenza B by PCR: NEGATIVE
Resp Syncytial Virus by PCR: NEGATIVE
SARS Coronavirus 2 by RT PCR: NEGATIVE

## 2024-06-08 LAB — TROPONIN T, HIGH SENSITIVITY
Troponin T High Sensitivity: <15 ng/L (ref 0–19)
Troponin T High Sensitivity: <15 ng/L (ref 0–19)

## 2024-06-08 MED ORDER — DIPHENHYDRAMINE HCL 25 MG PO CAPS
25.0000 mg | ORAL_CAPSULE | Freq: Once | ORAL | Status: DC
  Filled 2024-06-08: qty 1

## 2024-06-08 MED ORDER — PROCHLORPERAZINE MALEATE 10 MG PO TABS
10.0000 mg | ORAL_TABLET | Freq: Once | ORAL | Status: DC
  Filled 2024-06-08: qty 1

## 2024-06-08 MED ORDER — DIPHENHYDRAMINE HCL 50 MG/ML IJ SOLN
25.0000 mg | Freq: Once | INTRAMUSCULAR | Status: AC
  Administered 2024-06-08: 25 mg via INTRAVENOUS
  Filled 2024-06-08: qty 1

## 2024-06-08 MED ORDER — PROCHLORPERAZINE EDISYLATE 10 MG/2ML IJ SOLN
10.0000 mg | Freq: Once | INTRAMUSCULAR | Status: AC
  Administered 2024-06-08: 10 mg via INTRAVENOUS
  Filled 2024-06-08: qty 2

## 2024-06-08 NOTE — ED Provider Notes (Signed)
 " [place] EMERGENCY DEPARTMENT AT [facility] Provider Note   CSN: [id number] Arrival date & time: 06/08/24  1516     Patient presents with: Headache   [name] [name] is a 59 y.o. female.   59 year old female presenting with multiple complaints.  Patient notes symptoms today including chest pain, shortness of breath, body aches, headache beginning around 2 PM.  Describes chest pain as fluttering, symptoms lasted about 1 hour before resolving, symptoms of chest pain/shortness of breath have not returned.  Patient endorses generalized bodyaches and a headache, no vision changes or dizziness.  Endorses fever last night of 100.44F, resolved with use of Tylenol . Patient recently diagnosed with UTI and is on Macrobid.  Patient reports that her daughter was recently ill with norovirus, she had several days of diarrhea/nausea as well with intermittent cramping abdominal pain, but these symptoms have largely resolved.   Headache      Prior to Admission medications  Medication Sig Start Date End Date Taking? Authorizing Provider  Abatacept  (ORENCIA  IV) Inject 1,000 mg into the vein every 28 (twenty-eight) days.    Dolphus Reiter, MD  acyclovir  ointment (ZOVIRAX ) 5 % as needed.    [provider]  Acyclovir -Hydrocortisone  5-1 % CREA Apply topically to cold sores as needed 12/14/22   Vivienne Delon HERO, PA-C  Adapalene 0.3 % gel Apply 1 application topically daily as needed (acne).  12/31/14   [provider]  Alcohol  Swabs  (ALCOHOL  WIPES) 70 % PADS Alcohol  Prep Pads    [provider]  ALPRAZolam (XANAX) 0.5 MG tablet TAKE 1 TABLET BY MOUTH NIGHTLY AS NEEDED FOR ANXIETY/SLEEP INDICATIONS ANXIOUS, INSOMNIA GRIEVING.    [provider]  ARIPiprazole  (ABILIFY ) 2 MG tablet Take 2 mg by mouth daily. 09/25/21   [provider]  Ascorbic Acid (VITAMIN C PO) Take by mouth.    [provider]  aspirin  EC 81 MG tablet Take 1 tablet (81  mg total) by mouth daily. Swallow whole. 10/13/21   Shlomo Wilbert SAUNDERS, MD  atorvastatin  (LIPITOR ) 80 MG tablet     [provider]  Azelastine  HCl 137 MCG/SPRAY SOLN Place 1 spray into both nostrils 2 (two) times daily.    [provider]  benzonatate  (TESSALON ) 100 MG capsule Take 1 capsule (100 mg total) by mouth every 8 (eight) hours as needed for cough. 12/13/22   Hazen Darryle BRAVO, FNP  betamethasone, augmented, (DIPROLENE) 0.05 % lotion Apply topically as needed. 01/23/22   [provider]  buPROPion  (WELLBUTRIN  XL) 150 MG 24 hr tablet Take 3 tablets by mouth every morning.    [provider]  cetirizine  (ZYRTEC  ALLERGY ) 10 MG tablet Take 1 tablet (10 mg total) by mouth daily. 10/08/21   Christopher Savannah, PA-C  ciclopirox (LOPROX) 0.77 % cream as needed.    [provider]  cyclobenzaprine  (FLEXERIL ) 10 MG tablet TAKE 1 TABLET BY MOUTH AT BEDTIME AS NEEDED FOR MUSCLE SPASMS. 01/30/24   Cheryl Waddell HERO, PA-C  diclofenac  Sodium (VOLTAREN ) 1 % GEL Apply 2-4 grams to affected joint 4 times daily as needed. 08/17/23   Cheryl Waddell HERO, PA-C  diltiazem  (CARDIZEM  CD) 180 MG 24 hr capsule TAKE 1 CAPSULE BY MOUTH EVERY DAY 06/01/24   Shlomo Wilbert SAUNDERS, MD  doxepin  (SINEQUAN ) 25 MG capsule Take 25 mg by mouth at bedtime as needed. Patient not taking: Reported on 03/09/2024 04/28/19   [provider]  EPINEPHrine  0.3 mg/0.3 mL IJ SOAJ injection  07/02/19   [provider]  ezetimibe  (ZETIA ) 10 MG tablet TAKE 1 TABLET BY MOUTH EVERY DAY 04/02/24   Shlomo Wilbert SAUNDERS, MD  famciclovir  (FAMVIR ) 500 MG tablet Take 15,000 mg by mouth daily as needed (fever blister).  01/21/15   [provider]  famotidine  (PEPCID ) 20 MG tablet Take 20 mg by mouth as needed. 09/22/21   [provider]  fluticasone  (FLONASE ) 50 MCG/ACT nasal spray Place 1 spray into both nostrils daily. Patient taking differently: Place 1 spray into both nostrils as needed. 12/13/22   Hazen Darryle BRAVO, FNP  folic acid  (FOLVITE ) 1 MG tablet Take 2 tablets (2 mg total) by mouth daily. 08/17/23   Cheryl Waddell HERO, PA-C  gabapentin  (NEURONTIN ) 300 MG capsule TAKE 1 CAPSULE BY MOUTH THREE TIMES A DAY AS NEEDED FOR PAIN 06/04/24   Cheryl Waddell HERO, PA-C  hydrocortisone  2.5 % cream SMARTSIG:1 Topical Every Night 11/06/19   [provider]  hydrOXYzine  (VISTARIL ) 25 MG capsule Take 25 mg by mouth as needed. 10/04/21   [provider]  ketoconazole (NIZORAL) 2 % cream as needed. 03/14/20   [provider]  ketoconazole (NIZORAL) 2 % shampoo  06/03/20   [provider]  lidocaine  (LIDODERM ) 5 % APPLY 1 PATCH TO AFFECTED AREA FOR 12 HOURS IN A 24 HOUR PERIOD 06/04/24   Cheryl Waddell HERO, PA-C  LINZESS 145 MCG CAPS capsule Take 145 mcg by mouth daily. Patient not taking: Reported on 03/09/2024 01/26/22   [provider]  meclizine  (ANTIVERT ) 25 MG tablet Take 1 tablet (25 mg total) by mouth 3 (three) times daily as needed for dizziness. Patient not taking: Reported on 03/09/2024 04/30/23   Jackson, Hayn, MD  methocarbamol (ROBAXIN) 500 MG tablet as needed. 09/24/22   [provider]  Methotrexate , PF, (RASUVO ) 25 MG/0.5ML SOAJ Inject 25 mg into the skin once a week. **OTREXUP  NO LONGER AVAILABLE** 03/12/24   Dolphus Reiter, MD  metoprolol  tartrate (LOPRESSOR ) 100 MG tablet Take 1 tablet (100 mg total) by mouth once for 1 dose. Take 90-120 minutes prior to scan. Patient not taking: Reported on 03/09/2024 04/14/23 04/14/23  Shlomo Wilbert SAUNDERS, MD  Multiple Vitamins-Minerals (ZINC PO) Take by mouth.    [provider]  neomycin-polymyxin-hydrocortisone  (CORTISPORIN) 3.5-10000-1 OTIC suspension as needed.    [provider]  nitroGLYCERIN  (NITROSTAT ) 0.4 MG SL tablet Place 1 tablet (0.4 mg total) under the tongue every 5 (five) minutes as needed for chest pain. 04/14/23   Shlomo Wilbert SAUNDERS, MD  NYAMYC powder Apply topically as needed. 03/17/20   [provider]  nystatin cream (MYCOSTATIN) Apply topically as needed. 01/23/22   [provider]  ondansetron  (ZOFRAN -ODT) 4 MG disintegrating tablet Take 4 mg by mouth every 8 (eight) hours as needed.    [provider]  Oxymetazoline  HCl (NASAL SPRAY) 0.05 % SOLN     [provider]  pantoprazole  (PROTONIX ) 40 MG tablet Take by mouth as needed. 09/22/21   [provider]  progesterone (PROMETRIUM) 100 MG capsule Take 100 mg by mouth daily. Patient not taking: Reported on 03/09/2024 09/16/21   [provider]  propranolol (INDERAL) 10 MG tablet Take 10 mg by mouth 2 (two) times daily. Patient not taking: Reported on 03/09/2024    [provider]  pseudoephedrine  (SUDAFED) 60 MG tablet Take 1 tablet (60 mg total) by mouth every 8 (eight) hours as needed for congestion. 10/08/21   Christopher Savannah, PA-C  rosuvastatin  (CRESTOR ) 40 MG tablet Take 1 tablet (  40 mg total) by mouth daily. 04/01/23   Shlomo Wilbert SAUNDERS, MD  Triamcinolone  Acetonide 0.025 % LOTN as needed.    [provider]  valACYclovir (VALTREX) 500 MG tablet Take 500 mg by mouth as needed. 09/17/21   [provider]  valsartan-hydrochlorothiazide (DIOVAN-HCT) 160-12.5 MG tablet Take 1 tablet by mouth daily.    [provider]  WEGOVY 1 MG/0.5ML SOAJ SQ injection SMARTSIG:0.5 Milliliter(s) SUB-Q Once a Week    [provider]    Allergies: Influenza vaccines, Influenza virus vaccine, Amitriptyline, and Isosorbide     Review of Systems  Neurological:  Positive for headaches.    Updated Vital Signs  Vitals:   06/08/24 1523 06/08/24 1917 06/08/24 1917  BP: 133/89 124/87   Pulse: 87 78   Resp: 20 18   Temp: 98 F (36.7 C)  98 F (36.7 C)  TempSrc:   Oral  SpO2: 100% 98%      Physical Exam Vitals and nursing note reviewed.  Constitutional:      General: She is not in acute distress.    Appearance: She is not ill-appearing.  HENT:     Head:  Normocephalic and atraumatic.  Eyes:     Extraocular Movements: Extraocular movements intact.     Pupils: Pupils are equal, round, and reactive to light.  Cardiovascular:     Rate and Rhythm: Normal rate and regular rhythm.     Heart sounds: Normal heart sounds.  Pulmonary:     Effort: Pulmonary effort is normal.     Breath sounds: Normal breath sounds.  Abdominal:     Palpations: Abdomen is soft.     Tenderness: There is no abdominal tenderness. There is no guarding.  Musculoskeletal:     Cervical back: Normal range of motion and neck supple. No rigidity.     Comments: Moves all extremities spontaneously without difficulty  Skin:    General: Skin is warm and dry.  Neurological:     Mental Status: She is alert and oriented to person, place, and time.     (all labs ordered are listed, but only abnormal results are displayed) Labs Reviewed  URINALYSIS, ROUTINE W REFLEX MICROSCOPIC - Abnormal; Notable for the following components:      Result Value   Hgb urine dipstick TRACE (*)    Leukocytes,Ua SMALL (*)    All other components within normal limits  RESP PANEL BY RT-PCR (RSV, FLU A&B, COVID)  [id number]  URINE CULTURE  CBC WITH DIFFERENTIAL/PLATELET  BASIC METABOLIC PANEL WITH GFR  URINALYSIS, MICROSCOPIC (REFLEX)  TROPONIN T, HIGH SENSITIVITY  TROPONIN T, HIGH SENSITIVITY    EKG: None  Radiology: DG Chest 2 View Result Date: 06/08/2024 CLINICAL DATA:  Chest pain and shortness of breath. EXAM: CHEST - 2 VIEW COMPARISON:  04/30/2023 FINDINGS: The cardiomediastinal contours are normal. The lungs are clear. Pulmonary vasculature is normal. No consolidation, pleural effusion, or pneumothorax. No acute osseous abnormalities are seen. IMPRESSION: No active cardiopulmonary disease. Electronically Signed   By: Andrea Gasman M.D.   On: 06/08/2024 15:56     Procedures   Medications Ordered in the ED - No data to display                                  Medical Decision  Making This patient presents to the ED for concern of multiple complaints, this involves an extensive number of treatment options, and is a  complaint that carries with it a high risk of complications and morbidity.  The differential diagnosis includes COVID/flu/RSV, other viral respiratory illness, pneumonia, ACS, tension versus migraine headache   Co morbidities that complicate the patient evaluation  Rheumatoid arthritis   Additional history obtained:  Additional history obtained from record review External records from outside source obtained and reviewed including recent urgent care note   Lab Tests:  I Ordered, and personally interpreted labs.  The pertinent results include: CBC within normal limits.  BMP within normal limits.  Initial troponin< 15, repeat remains unchanged.  Urinalysis notable for small leukocytes with trace RBCs, 21-50 white blood cells noted on urinalysis however no bacteria, will send for culture. Respiratory panel negative.    Imaging Studies ordered:  I ordered imaging studies including CXR  I independently visualized and interpreted imaging which showed No active cardiopulmonary disease.  I agree with the radiologist interpretation   Cardiac Monitoring: / EKG:  The patient was maintained on a cardiac monitor.  I personally viewed and interpreted the cardiac monitored which showed an underlying rhythm of: NSR   Problem List / ED Course / Critical interventions / Medication management  I ordered medication including compazine /benadryl   for migraine cocktail  Reevaluation of the patient after these medicines showed that the patient resolved I have reviewed the patients home medicines and have made adjustments as needed  Test / Admission - Considered:  Physical exam is unremarkable as above, patient is well-appearing and in no acute distress, normal neurologic exam.  Patient complains of multiple complaints including headache/body aches/chest pain,  although chest pain was brief in nature today and has not since returned.  Will proceed with ACS workup and reassess patient after administration of migraine cocktail. ACS workup is unremarkable as above, troponin normal x 2, chest x-ray unremarkable, EKG without acute ischemic changes. At time of my reassessment, patient notes resolution of headache and reports feeling back to normal, she has been able to sleep comfortably in the room without return of her symptoms.   COVID/flu/RSV negative.   Again, I suspect that her symptoms may be secondary to a virus, given that she was recently exposed to norovirus and was briefly symptomatic with abdominal pain/diarrhea, although this has since resolved.  I reviewed the patient's results in depth with her, she was recently diagnosed with a urinary tract infection and I recommend that she continue these antibiotics as previously directed.  Return precautions discussed, patient voiced understanding and is in agreement this plan, she is appropriate for discharge at this time.    Amount and/or Complexity of Data Reviewed Labs: ordered. Radiology: ordered.  Risk Prescription drug management.        Final diagnoses:  Nonintractable headache, unspecified chronicity pattern, unspecified headache type  Chest pain, unspecified type    ED Discharge Orders     None          [name] [name] 06/08/24 2117    Patsey Lot, MD 06/08/24 2349  "

## 2024-06-08 NOTE — ED Triage Notes (Signed)
 Headache, body aches chest pain Dx with UTI on macrobid Started feeling bad around 2pm

## 2024-06-08 NOTE — ED Notes (Signed)
 Reviewed AVS/discharge instruction with patient. Time allotted for and all questions answered. Patient is agreeable for d/c and escorted to ed exit by staff.

## 2024-06-08 NOTE — Discharge Instructions (Addendum)
 Your symptoms today may be due to a viral illness, however you did test negative for COVID/flu/RSV.  Your troponin level, which is a cardiac enzyme, was normal today, and your EKG/chest x-ray was reassuring.  Continue Tylenol /ibuprofen  as needed for headache if your symptoms return.  Follow-up with your PCP.  Return to the emergency department if your symptoms worsen.

## 2024-06-10 LAB — URINE CULTURE

## 2024-06-13 ENCOUNTER — Ambulatory Visit (HOSPITAL_COMMUNITY)
Admission: RE | Admit: 2024-06-13 | Discharge: 2024-06-13 | Disposition: A | Discharge status: Home / Self Care | Source: Ambulatory Visit | Attending: Rheumatology | Admitting: Rheumatology

## 2024-06-13 ENCOUNTER — Telehealth (HOSPITAL_COMMUNITY): Payer: Self-pay | Admitting: Pharmacy Technician

## 2024-06-13 VITALS — BP 111/83 | HR 86 | Temp 98.0°F | Resp 16

## 2024-06-13 DIAGNOSIS — M0579 Rheumatoid arthritis with rheumatoid factor of multiple sites without organ or systems involvement: Secondary | ICD-10-CM | POA: Diagnosis present

## 2024-06-13 MED ORDER — ACETAMINOPHEN 325 MG PO TABS
ORAL_TABLET | ORAL | Status: AC
  Filled 2024-06-13: qty 2

## 2024-06-13 MED ORDER — DIPHENHYDRAMINE HCL 25 MG PO CAPS
25.0000 mg | ORAL_CAPSULE | Freq: Once | ORAL | Status: AC
  Administered 2024-06-13: 25 mg via ORAL

## 2024-06-13 MED ORDER — ACETAMINOPHEN 325 MG PO TABS
650.0000 mg | ORAL_TABLET | Freq: Once | ORAL | Status: AC
  Administered 2024-06-13: 650 mg via ORAL

## 2024-06-13 MED ORDER — DIPHENHYDRAMINE HCL 25 MG PO CAPS
ORAL_CAPSULE | ORAL | Status: AC
  Filled 2024-06-13: qty 1

## 2024-06-13 MED ORDER — SODIUM CHLORIDE 0.9 % IV SOLN
1000.0000 mg | Freq: Once | INTRAVENOUS | Status: AC
  Administered 2024-06-13: 1000 mg via INTRAVENOUS
  Filled 2024-06-13: qty 40

## 2024-06-13 NOTE — Telephone Encounter (Signed)
 Auth Submission: APPROVED Site of care: [facility] Payer: [employer] Medication & CPT/J Code(s) submitted: [place]  (Abatacept ) [id number] Diagnosis Code: M05.79 Route of submission (phone, fax, portal): phone Phone # [phone] ([facility]) Fax # Auth type: Buy/Bill HB Units/visits requested: 1000mg  every 4 weeks Reference number: [id number] Approval from: 06/13/24 to 06/13/25    Dagoberto Armour, CPhT [facility] Phone: [phone] 06/13/2024

## 2024-07-05 ENCOUNTER — Encounter: Payer: Self-pay | Admitting: Cardiology

## 2024-07-05 ENCOUNTER — Ambulatory Visit: Admitting: Cardiology

## 2024-07-05 VITALS — BP 112/72 | HR 90 | Ht 62.0 in | Wt 233.0 lb

## 2024-07-05 DIAGNOSIS — I10 Essential (primary) hypertension: Secondary | ICD-10-CM

## 2024-07-05 DIAGNOSIS — E7800 Pure hypercholesterolemia, unspecified: Secondary | ICD-10-CM | POA: Diagnosis not present

## 2024-07-05 DIAGNOSIS — I2510 Atherosclerotic heart disease of native coronary artery without angina pectoris: Secondary | ICD-10-CM

## 2024-07-05 DIAGNOSIS — Z79899 Other long term (current) drug therapy: Secondary | ICD-10-CM | POA: Diagnosis not present

## 2024-07-05 DIAGNOSIS — R079 Chest pain, unspecified: Secondary | ICD-10-CM

## 2024-07-05 DIAGNOSIS — R002 Palpitations: Secondary | ICD-10-CM | POA: Diagnosis not present

## 2024-07-05 DIAGNOSIS — I517 Cardiomegaly: Secondary | ICD-10-CM | POA: Diagnosis not present

## 2024-07-05 NOTE — Patient Instructions (Signed)
 Medication Instructions:  Your physician recommends that you continue on your current medications as directed. Please refer to the Current Medication list given to you today.  *If you need a refill on your cardiac medications before your next appointment, please call your pharmacy*  Lab Work: Please complete a FASTING lipid panel and a CMET at any [facility] in 6 weeks.  If you have labs (blood work) drawn today and your tests are completely normal, you will receive your results only by: MyChart Message (if you have MyChart) OR A paper copy in the mail If you have any lab test that is abnormal or we need to change your treatment, we will call you to review the results.  Testing/Procedures:    Please report to Radiology at the [facility] Main Entrance 30 minutes early for your test.  [address] [place], [place] [address]                          How to Prepare for Your Cardiac PET/CT Stress Test:  Nothing to eat or drink, except water , 3 hours prior to arrival time.  NO caffeine/decaffeinated products, or chocolate 12 hours prior to arrival. (Please note decaffeinated beverages (teas/coffees) still contain caffeine).  If you have caffeine within 12 hours prior, the test will need to be rescheduled.  Medication instructions: Do not take erectile dysfunction medications for 72 hours prior to test (sildenafil, tadalafil) Do not take nitrates (isosorbide  mononitrate, Ranexa) the day before or day of test Do not take tamsulosin the day before or morning of test Hold theophylline containing medications for 12 hours. Hold Dipyridamole 48 hours prior to the test.  You may take your remaining medications with water .  NO perfume, cologne or lotion on chest or abdomen area. FEMALES - Please avoid wearing dresses to this appointment.  Total time is 1 to 2 hours; you may want to bring reading material for the waiting time.  IF YOU THINK YOU MAY BE PREGNANT, OR ARE NURSING  PLEASE INFORM THE TECHNOLOGIST.  In preparation for your appointment, medication and supplies will be purchased.  Appointment availability is limited, so if you need to cancel or reschedule, please call the Radiology Department Scheduler at [phone] 24 hours in advance to avoid a cancellation fee of $100.00  What to Expect When you Arrive:  Once you arrive and check in for your appointment, you will be taken to a preparation room within the Radiology Department.  A technologist or Nurse will obtain your medical history, verify that you are correctly prepped for the exam, and explain the procedure.  Afterwards, an IV will be started in your arm and electrodes will be placed on your skin for EKG monitoring during the stress portion of the exam. Then you will be escorted to the PET/CT scanner.  There, staff will get you positioned on the scanner and obtain a blood pressure and EKG.  During the exam, you will continue to be connected to the EKG and blood pressure machines.  A small, safe amount of a radioactive tracer will be injected in your IV to obtain a series of pictures of your heart along with an injection of a stress agent.    After your Exam:  It is recommended that you eat a meal and drink a caffeinated beverage to counter act any effects of the stress agent.  Drink plenty of fluids for the remainder of the day and urinate frequently for the  first couple of hours after the exam.  Your doctor will inform you of your test results within 7-10 business days.  For more information and frequently asked questions, please visit our website: [email or web address]  For questions about your test or how to prepare for your test, please call: Cardiac Imaging Nurse Navigators Office: [phone]  For billing questions, please call [phone].    Follow-Up: At [facility], you and your health needs are our priority.  As part of our continuing mission to provide you with  exceptional heart care, our providers are all part of one team.  This team includes your primary Cardiologist (physician) and Advanced Practice Providers or APPs (Physician Assistants and Nurse Practitioners) who all work together to provide you with the care you need, when you need it.  Your next appointment will be dependent on the results of your stress test and it will be with:    Provider:   Wilbert Bihari, MD

## 2024-07-05 NOTE — Addendum Note (Signed)
 Addended by: JANIT GENI CROME on: 07/05/2024 10:56 AM   Modules accepted: Orders

## 2024-07-05 NOTE — Progress Notes (Signed)
 "   Cardiology Note    Date:  07/05/2024   ID:  [name] [name], DOB 07-29-1965, MRN [id number]  PCP:  Loris Elsie PARAS, PA-C  Cardiologist:  Wilbert Bihari, MD   Chief Complaint  Patient presents with   Coronary Artery Disease   Hyperlipidemia   Hypertension   Palpitations    History of Present Illness:  [name] [name] is a 59 y.o. female with a history of diastolic dysfunction, rheumatoid arthritis, depression, atypical chest pain with minimal CAD by coronary CTA showing coronary calcium  score 95th percentile but only 30% proximal LAD stenosis.    She was complaining of chest pain at OV in 2023 and underwent coronary CTA which revealed a coronary calcium  score of 273, separate ostia of the LAD and left circumflex of the left coronary cusp and nonobstructive CAD less than 25% in the proximal to mid LAD and proximal left circumflex.  It was felt that her chest pain was likely related to stress.  The patient is here today and is doing well.  She has been having more CP that she thinks is worse compared to a year ago when she had the coronary CTA.  She has been having SOB at well.  She went to the ER in Jan 2026 and EKG showed low voltage in anterior leads and cannot rule out prior anterior MI.  SHe sometimes wakes up at night with SOB which scares her.  She sleeps on 3 pillow for comfort.  Sometimes she has dizziness when she stands up. She denies any  LE edema,  palpitations or syncope.  Past Medical History:  Diagnosis Date   Anxiety    Bursitis    CAD (coronary artery disease), native coronary artery 04/2023   coronary Ca score of 289 with <25% stenosis of the prox to mid LAD and prox LCx.   Depression    Diastolic dysfunction    Dyslipidemia 01/30/2015   Esophageal ring    Fibromyalgia    Hiatal hernia    HSV-1 infection    Morbid obesity (HCC)    Osteoarthritis    Rheumatoid arthritis(714.0)    M05.79   Rheumatoid arthritis, seropositive, 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HCC)     Treated with Orencia , TB neg 09/12/2015   Spondylolysis     Past Surgical History:  Procedure Laterality Date   CARDIAC CATHETERIZATION N/A 06/11/2016   Procedure: Left Heart Cath and Coronary Angiography;  Surgeon: Candyce GORMAN Reek, MD;  Location[facility] INVASIVE CV LAB;  Service: Cardiovascular;  Laterality: N/A;   CESAREAN SECTION     COLONOSCOPY  07/27/2017   ESOPHAGEAL MANOMETRY N/A 10/28/2014   Procedure: ESOPHAGEAL MANOMETRY (EM);  Surgeon: Norleen LOISE Kiang, MD;  Location: [facility] ENDOSCOPY;  Service: Endoscopy;  Laterality: N/A;   HERNIA REPAIR     reports surgery on 3 hernias, with 2 more present   LAPAROSCOPIC GASTRIC SLEEVE RESECTION  11/21/2012   [facility]   LEFT HEART CATHETERIZATION WITH CORONARY ANGIOGRAM N/A 08/26/2014   Procedure: LEFT HEART CATHETERIZATION WITH CORONARY ANGIOGRAM;  Surgeon: Dorn PARAS Lesches, MD;  Location: [facility] CATH LAB;  Service: Cardiovascular;  Laterality: N/A;   SHOULDER SURGERY Left 10/04/2022   TUBAL LIGATION      Current Medications: Current Meds  Medication Sig   Abatacept  (ORENCIA  IV) Inject 1,000 mg into the vein every 28 (twenty-eight) days.   acyclovir  ointment (ZOVIRAX ) 5 % as needed.   Acyclovir -Hydrocortisone  5-1 % CREA Apply topically to cold sores as needed   Adapalene 0.3 %  gel Apply 1 application topically daily as needed (acne).    Alcohol  Swabs  (ALCOHOL  WIPES) 70 % PADS Alcohol  Prep Pads   ALPRAZolam (XANAX) 0.5 MG tablet TAKE 1 TABLET BY MOUTH NIGHTLY AS NEEDED FOR ANXIETY/SLEEP INDICATIONS ANXIOUS, INSOMNIA GRIEVING.   Ascorbic Acid (VITAMIN C PO) Take by mouth.   aspirin  EC 81 MG tablet Take 1 tablet (81 mg total) by mouth daily. Swallow whole.   Azelastine  HCl 137 MCG/SPRAY SOLN Place 1 spray into both nostrils 2 (two) times daily.   benzonatate  (TESSALON ) 100 MG capsule Take 1 capsule (100 mg total) by mouth every 8 (eight) hours as needed for cough.   betamethasone, augmented, (DIPROLENE) 0.05 % lotion Apply topically as needed.    buPROPion  (WELLBUTRIN  XL) 150 MG 24 hr tablet Take 3 tablets by mouth every morning.   cetirizine  (ZYRTEC  ALLERGY ) 10 MG tablet Take 1 tablet (10 mg total) by mouth daily. (Patient taking differently: Take 10 mg by mouth as needed.)   cyclobenzaprine  (FLEXERIL ) 10 MG tablet TAKE 1 TABLET BY MOUTH AT BEDTIME AS NEEDED FOR MUSCLE SPASMS.   diclofenac  Sodium (VOLTAREN ) 1 % GEL Apply 2-4 grams to affected joint 4 times daily as needed.   EPINEPHrine  0.3 mg/0.3 mL IJ SOAJ injection    ezetimibe  (ZETIA ) 10 MG tablet TAKE 1 TABLET BY MOUTH EVERY DAY   famciclovir  (FAMVIR ) 500 MG tablet Take 15,000 mg by mouth daily as needed (fever blister).    famotidine  (PEPCID ) 20 MG tablet Take 20 mg by mouth as needed.   fluticasone  (FLONASE ) 50 MCG/ACT nasal spray Place 1 spray into both nostrils daily. (Patient taking differently: Place 1 spray into both nostrils as needed.)   folic acid  (FOLVITE ) 1 MG tablet Take 2 tablets (2 mg total) by mouth daily.   gabapentin  (NEURONTIN ) 300 MG capsule TAKE 1 CAPSULE BY MOUTH THREE TIMES A DAY AS NEEDED FOR PAIN   hydrocortisone  2.5 % cream SMARTSIG:1 Topical Every Night   hydrOXYzine  (VISTARIL ) 25 MG capsule Take 25 mg by mouth as needed.   ketoconazole (NIZORAL) 2 % cream as needed.   lidocaine  (LIDODERM ) 5 % APPLY 1 PATCH TO AFFECTED AREA FOR 12 HOURS IN A 24 HOUR PERIOD   LINZESS 145 MCG CAPS capsule Take 145 mcg by mouth daily.   Methotrexate , PF, (RASUVO ) 25 MG/0.5ML SOAJ Inject 25 mg into the skin once a week. **OTREXUP  NO LONGER AVAILABLE**   Multiple Vitamins-Minerals (ZINC PO) Take by mouth.   neomycin-polymyxin-hydrocortisone  (CORTISPORIN) 3.5-10000-1 OTIC suspension as needed.   nitroGLYCERIN  (NITROSTAT ) 0.4 MG SL tablet Place 1 tablet (0.4 mg total) under the tongue every 5 (five) minutes as needed for chest pain.   NYAMYC powder Apply topically as needed.   nystatin cream (MYCOSTATIN) Apply topically as needed.   ondansetron  (ZOFRAN -ODT) 4 MG  disintegrating tablet Take 4 mg by mouth every 8 (eight) hours as needed.   rosuvastatin  (CRESTOR ) 40 MG tablet Take 1 tablet (40 mg total) by mouth daily.   valsartan-hydrochlorothiazide (DIOVAN-HCT) 160-12.5 MG tablet Take 1 tablet by mouth daily.   WEGOVY 1 MG/0.5ML SOAJ SQ injection SMARTSIG:0.5 Milliliter(s) SUB-Q Once a Week   Current Facility-Administered Medications for the 07/05/24 encounter (Office Visit) with Shlomo Wilbert SAUNDERS, MD  Medication   ipratropium (ATROVENT ) 0.03 % nasal spray 2 spray    Allergies:   Influenza vaccines, Influenza virus vaccine, Amitriptyline, and Isosorbide    Social History   Socioeconomic History   Marital status: Married    Spouse name: Lonni  Number of children: 2   Years of education: 16   Highest education level: Bachelor's degree (e.g., BA, AB, BS)  Occupational History   Occupation: CUSTOMER SERVICE    Employer: [employer]  Tobacco Use   Smoking status: Never    Passive exposure: Never   Smokeless tobacco: Never  Vaping Use   Vaping status: Never Used  Substance and Sexual Activity   Alcohol  use: No   Drug use: No   Sexual activity: Yes    Partners: Male    Birth control/protection: None  Other Topics Concern   Not on file  Social History Narrative   Marital Status: Married Probation Officer)    Children:  Son Cathy) Daughter Teacher, Adult Education)    Pets: None    Living Situation: Lives with husband and children    Occupation: Occupational Psychologist Administrator)     Education: Oncologist (Psychology)     Tobacco Use/Exposure:  None    Alcohol  Use:  Occasional   Drug Use:  None   Diet:  Regular   Exercise: Walking or Treadmill (2 x week)   Hobbies: Clinical Cytogeneticist, [employer] Decorations.               Social Drivers of Health   Tobacco Use: Low Risk (07/05/2024)   Patient History    Smoking Tobacco Use: Never    Smokeless Tobacco Use: Never    Passive Exposure: Never  Recent Concern: Tobacco Use - Medium Risk  (06/28/2024)   Received from [facility]   Patient History    Smoking Tobacco Use: Former    Smokeless Tobacco Use: Never    Passive Exposure: Not on file  Financial Resource Strain: Low Risk (07/04/2024)   Overall Financial Resource Strain (CARDIA)    Difficulty of Paying Living Expenses: Not hard at all  Food Insecurity: No Food Insecurity (07/04/2024)   Epic    Worried About Radiation Protection Practitioner of Food in the Last Year: Never true    Ran Out of Food in the Last Year: Never true  Transportation Needs: No Transportation Needs (07/04/2024)   Epic    Lack of Transportation (Medical): No    Lack of Transportation (Non-Medical): No  Physical Activity: Insufficiently Active (07/04/2024)   Exercise Vital Sign    Days of Exercise per Week: 1 day    Minutes of Exercise per Session: 10 min  Stress: Stress Concern Present (07/04/2024)   [employer] of Occupational Health - Occupational Stress Questionnaire    Feeling of Stress: To some extent  Social Connections: Moderately Integrated (07/04/2024)   Social Connection and Isolation Panel    Frequency of Communication with Friends and Family: More than three times a week    Frequency of Social Gatherings with Friends and Family: Once a week    Attends Religious Services: More than 4 times per year    Active Member of [employer] or Organizations: No    Attends Banker Meetings: Not on file    Marital Status: Married  Depression (EYV7-0): Not on file  Alcohol  Screen: Low Risk (07/04/2024)   Alcohol  Screen    Last Alcohol  Screening Score (AUDIT): 1  Housing: Low Risk (07/04/2024)   Epic    Unable to Pay for Housing in the Last Year: No    Number of Times Moved in the Last Year: 0    Homeless in the Last Year: No  Utilities: Low Risk (06/28/2024)   Received from [facility]   Utilities    In the  past 12 months has the electric, gas, oil, or water  company threatened to shut off services in your home? : No  Health Literacy: Not on file      Family History:  The patient's family history includes Cancer in her father; Depression in her mother; Diabetes in her maternal grandmother; Heart attack in her paternal grandmother; Hyperlipidemia in her mother; Hypertension in her maternal grandmother and paternal grandmother; Lung disease in her father; Sarcoidosis in her father.   ROS:   Please see the history of present illness.    ROS All other systems reviewed and are negative.      No data to display             PHYSICAL EXAM:   VS:  BP 112/72   Pulse 90   Ht 5' 2 (1.575 m)   Wt 233 lb (105.7 kg)   SpO2 98%   BMI 42.62 kg/m    GEN: Well nourished, well developed in no acute distress HEENT: Normal NECK: No JVD; No carotid bruits LYMPHATICS: No lymphadenopathy CARDIAC:RRR, no murmurs, rubs, gallops RESPIRATORY:  Clear to auscultation without rales, wheezing or rhonchi  ABDOMEN: Soft, non-tender, non-distended MUSCULOSKELETAL:  No edema; No deformity  SKIN: Warm and dry NEUROLOGIC:  Alert and oriented x 3 PSYCHIATRIC:  Normal affect  Wt Readings from Last 3 Encounters:  07/05/24 233 lb (105.7 kg)  05/02/24 235 lb (106.6 kg)  03/09/24 238 lb (108 kg)      Studies/Labs Reviewed:    Recent Labs: 04/04/2024: ALT 14 06/08/2024: BUN 17; Creatinine, Ser 0.94; Hemoglobin 13.2; Platelets 297; Potassium 4.1; Sodium 143   Lipid Panel    Component Value Date/Time   CHOL 140 06/15/2023 0841   TRIG 75 06/15/2023 0841   HDL 68 06/15/2023 0841   CHOLHDL 2.1 06/15/2023 0841   CHOLHDL 3 02/06/2015 1055   VLDL 16.4 02/06/2015 1055   LDLCALC 57 06/15/2023 0841    Additional studies/ records that were reviewed today include:  none    ASSESSMENT:    1. Coronary artery disease involving native coronary artery of native heart without angina pectoris   2. Pure hypercholesterolemia   3. Essential hypertension   4. Cardiomegaly   5. Palpitations        PLAN:  In order of problems listed  above:  ASCAD -Coronary CTA in 2017 showed minimal nonobstructive CAD with 30% proximal LAD stenosis. -Repeat coronary CTA 10/2021 for recurrent chest pain showed coronary calcium  score of 273, separate ostia of the LAD and left circumflex of the left coronary cusp and nonobstructive CAD less than 25% in the proximal to mid LAD and proximal left circumflex.  -She had recurrent chest pain and underwent coronary CTA 05/27/2023 revealing coronary calcium  score of 289 with less than 25% stenosis in the proximal to mid LAD and proximal left circumflex.  Chest pain was felt to be noncardiac -She is now having worsening CP and SOB -I think she would benefit from a Stress PET CT to assess for microvascular angina\ -Informed Consent   Shared Decision Making/Informed Consent The risks [chest pain, shortness of breath, cardiac arrhythmias, dizziness, blood pressure fluctuations, myocardial infarction, stroke/transient ischemic attack, nausea, vomiting, allergic reaction, radiation exposure, metallic taste sensation and life-threatening complications (estimated to be 1 in 10,000)], benefits (risk stratification, diagnosing coronary artery disease, treatment guidance) and alternatives of a cardiac PET stress test were discussed in detail with Ms. Abair and she agrees to proceed.    - Continue aspirin  81 mg  daily, Crestor  40 mg daily with as needed refills  HLD -LDL goal less than 70 -I have personally reviewed and interpreted outside labs performed by patient's PCP which showed LDL 107, HDL 94 06/28/2024 -she had not been taking her Crestor  and started back taking it daily last week -Continue Crestor  40 mg daily with as needed refills -check FLP and ALT in 6 weeks  Hypertension -BP controlled on exam today -Continue valsartan HCT 160/12.5 mg daily with as needed refills  Cardiomegaly -noted on Cxray -likely related to  pericardial fat pad -2D echo 10/24/2021 showed normal LV size and systolic function  EF 60 to 65% with no cardiomegaly and normal RV size and function.  Palpitations -Monitor was ordered 06/27/2023 but she never completed it heart  SOB during sleep -I am going to get a sleep study >>she was supposed to have one at last OV but never completed it -she has an NPSG scheduled later this week  Followup with me in 1 year  Time Spent: 20 minutes total time of encounter, including 15 minutes spent in face-to-face patient care on the date of this encounter. This time includes coordination of care and counseling regarding above mentioned problem list. Remainder of non-face-to-face time involved reviewing chart documents/testing relevant to the patient encounter and documentation in the medical record. I have independently reviewed documentation from referring provider  Medication Adjustments/Labs and Tests Ordered: Current medicines are reviewed at length with the patient today.  Concerns regarding medicines are outlined above.  Medication changes, Labs and Tests ordered today are listed in the Patient Instructions below.  There are no Patient Instructions on file for this visit.   Signed, Wilbert Bihari, MD  07/05/2024 9:54 AM    [facility] [address] [place], [place], [place]  [address] Phone: [phone]; Fax: [phone]   "

## 2024-07-05 NOTE — Addendum Note (Signed)
 Addended by: JANIT GENI CROME on: 07/05/2024 10:18 AM   Modules accepted: Orders

## 2024-07-06 NOTE — Addendum Note (Signed)
 Addended by: SHLOMO CORNING R on: 07/06/2024 10:26 AM   Modules accepted: Orders

## 2024-07-10 ENCOUNTER — Ambulatory Visit (HOSPITAL_BASED_OUTPATIENT_CLINIC_OR_DEPARTMENT_OTHER): Attending: Cardiology | Admitting: Cardiology

## 2024-07-10 DIAGNOSIS — R002 Palpitations: Secondary | ICD-10-CM

## 2024-07-10 DIAGNOSIS — I10 Essential (primary) hypertension: Secondary | ICD-10-CM

## 2024-07-10 DIAGNOSIS — G4719 Other hypersomnia: Secondary | ICD-10-CM

## 2024-07-10 DIAGNOSIS — I2510 Atherosclerotic heart disease of native coronary artery without angina pectoris: Secondary | ICD-10-CM

## 2024-07-11 ENCOUNTER — Inpatient Hospital Stay (HOSPITAL_COMMUNITY)
Admission: RE | Admit: 2024-07-11 | Discharge: 2024-07-11 | Disposition: A | Source: Ambulatory Visit | Attending: Rheumatology

## 2024-07-11 VITALS — BP 99/72 | HR 81 | Temp 97.8°F | Resp 18 | Wt 234.0 lb

## 2024-07-11 DIAGNOSIS — M0579 Rheumatoid arthritis with rheumatoid factor of multiple sites without organ or systems involvement: Secondary | ICD-10-CM

## 2024-07-11 MED ORDER — ACETAMINOPHEN 325 MG PO TABS
ORAL_TABLET | ORAL | Status: AC
  Filled 2024-07-11: qty 2

## 2024-07-11 MED ORDER — DIPHENHYDRAMINE HCL 25 MG PO CAPS
ORAL_CAPSULE | ORAL | Status: AC
  Filled 2024-07-11: qty 1

## 2024-07-11 MED ORDER — SODIUM CHLORIDE 0.9 % IV SOLN
1000.0000 mg | Freq: Once | INTRAVENOUS | Status: AC
  Administered 2024-07-11: 1000 mg via INTRAVENOUS
  Filled 2024-07-11: qty 40

## 2024-07-11 MED ORDER — ACETAMINOPHEN 325 MG PO TABS
650.0000 mg | ORAL_TABLET | Freq: Once | ORAL | Status: AC
  Administered 2024-07-11: 650 mg via ORAL

## 2024-07-11 MED ORDER — DIPHENHYDRAMINE HCL 25 MG PO CAPS
25.0000 mg | ORAL_CAPSULE | Freq: Once | ORAL | Status: AC
  Administered 2024-07-11: 25 mg via ORAL

## 2024-07-24 ENCOUNTER — Other Ambulatory Visit (HOSPITAL_COMMUNITY)

## 2024-08-01 ENCOUNTER — Ambulatory Visit: Admitting: Cardiology

## 2024-08-08 ENCOUNTER — Inpatient Hospital Stay (HOSPITAL_COMMUNITY): Admission: RE | Admit: 2024-08-08 | Source: Ambulatory Visit

## 2024-08-08 ENCOUNTER — Encounter (HOSPITAL_COMMUNITY)

## 2024-08-21 ENCOUNTER — Encounter: Attending: Rheumatology | Admitting: Physician Assistant
